# Patient Record
Sex: Female | Born: 1965 | Race: Black or African American | Hispanic: No | Smoking: Never smoker
Health system: Southern US, Community
[De-identification: ages and names within clinical notes are randomized; demographics above are authoritative.]

## PROBLEM LIST (undated history)

## (undated) ENCOUNTER — Ambulatory Visit (HOSPITAL_COMMUNITY): Admission: EM | Discharge status: Absent without leave | Payer: TRICARE For Life (TFL)

## (undated) DIAGNOSIS — F329 Major depressive disorder, single episode, unspecified: Secondary | ICD-10-CM

## (undated) DIAGNOSIS — F419 Anxiety disorder, unspecified: Secondary | ICD-10-CM

## (undated) DIAGNOSIS — F32A Depression, unspecified: Secondary | ICD-10-CM

## (undated) DIAGNOSIS — E079 Disorder of thyroid, unspecified: Secondary | ICD-10-CM

## (undated) DIAGNOSIS — I619 Nontraumatic intracerebral hemorrhage, unspecified: Secondary | ICD-10-CM

## (undated) DIAGNOSIS — A4902 Methicillin resistant Staphylococcus aureus infection, unspecified site: Secondary | ICD-10-CM

## (undated) DIAGNOSIS — R569 Unspecified convulsions: Secondary | ICD-10-CM

## (undated) DIAGNOSIS — G40919 Epilepsy, unspecified, intractable, without status epilepticus: Secondary | ICD-10-CM

## (undated) DIAGNOSIS — C801 Malignant (primary) neoplasm, unspecified: Secondary | ICD-10-CM

## (undated) DIAGNOSIS — Z89519 Acquired absence of unspecified leg below knee: Secondary | ICD-10-CM

## (undated) DIAGNOSIS — M1990 Unspecified osteoarthritis, unspecified site: Secondary | ICD-10-CM

## (undated) DIAGNOSIS — I82409 Acute embolism and thrombosis of unspecified deep veins of unspecified lower extremity: Secondary | ICD-10-CM

## (undated) HISTORY — PX: FRACTURE SURGERY: SHX138

## (undated) HISTORY — DX: Epilepsy, unspecified, intractable, without status epilepticus: G40.919

## (undated) HISTORY — PX: ABOVE KNEE LEG AMPUTATION: SUR20

## (undated) HISTORY — DX: Unspecified osteoarthritis, unspecified site: M19.90

## (undated) HISTORY — DX: Anxiety disorder, unspecified: F41.9

## (undated) HISTORY — DX: Nontraumatic intracerebral hemorrhage, unspecified: I61.9

## (undated) HISTORY — DX: Depression, unspecified: F32.A

## (undated) HISTORY — DX: Malignant (primary) neoplasm, unspecified: C80.1

## (undated) HISTORY — DX: Disorder of thyroid, unspecified: E07.9

## (undated) HISTORY — PX: TOTAL KNEE ARTHROPLASTY: SHX125

## (undated) HISTORY — DX: Unspecified convulsions: R56.9

## (undated) HISTORY — DX: Major depressive disorder, single episode, unspecified: F32.9

## (undated) HISTORY — DX: Acute embolism and thrombosis of unspecified deep veins of unspecified lower extremity: I82.409

---

## 2002-02-22 DIAGNOSIS — I619 Nontraumatic intracerebral hemorrhage, unspecified: Secondary | ICD-10-CM

## 2002-02-22 HISTORY — PX: BRAIN SURGERY: SHX531

## 2002-02-22 HISTORY — DX: Nontraumatic intracerebral hemorrhage, unspecified: I61.9

## 2003-10-08 ENCOUNTER — Ambulatory Visit (HOSPITAL_COMMUNITY): Admission: RE | Admit: 2003-10-08 | Discharge: 2003-10-08 | Payer: Self-pay | Admitting: Unknown Physician Specialty

## 2004-05-11 ENCOUNTER — Encounter: Admission: RE | Admit: 2004-05-11 | Discharge: 2004-08-09 | Payer: Self-pay | Admitting: Neurology

## 2004-09-09 ENCOUNTER — Emergency Department (HOSPITAL_COMMUNITY): Admission: EM | Admit: 2004-09-09 | Discharge: 2004-09-09 | Payer: Self-pay | Admitting: Emergency Medicine

## 2005-03-20 ENCOUNTER — Emergency Department (HOSPITAL_COMMUNITY): Admission: EM | Admit: 2005-03-20 | Discharge: 2005-03-20 | Payer: Self-pay | Admitting: Emergency Medicine

## 2006-04-16 ENCOUNTER — Emergency Department (HOSPITAL_COMMUNITY): Admission: EM | Admit: 2006-04-16 | Discharge: 2006-04-16 | Payer: Self-pay | Admitting: Emergency Medicine

## 2008-08-10 ENCOUNTER — Emergency Department (HOSPITAL_COMMUNITY): Admission: EM | Admit: 2008-08-10 | Discharge: 2008-08-10 | Payer: Self-pay | Admitting: Emergency Medicine

## 2008-09-19 ENCOUNTER — Emergency Department (HOSPITAL_COMMUNITY): Admission: EM | Admit: 2008-09-19 | Discharge: 2008-09-19 | Payer: Self-pay | Admitting: Emergency Medicine

## 2008-10-02 ENCOUNTER — Emergency Department (HOSPITAL_COMMUNITY): Admission: EM | Admit: 2008-10-02 | Discharge: 2008-10-02 | Payer: Self-pay | Admitting: Emergency Medicine

## 2008-10-15 ENCOUNTER — Emergency Department (HOSPITAL_COMMUNITY): Admission: EM | Admit: 2008-10-15 | Discharge: 2008-10-15 | Payer: Self-pay | Admitting: Emergency Medicine

## 2008-10-16 ENCOUNTER — Emergency Department (HOSPITAL_COMMUNITY): Admission: EM | Admit: 2008-10-16 | Discharge: 2008-10-16 | Payer: Self-pay | Admitting: Emergency Medicine

## 2008-10-20 ENCOUNTER — Emergency Department (HOSPITAL_COMMUNITY): Admission: EM | Admit: 2008-10-20 | Discharge: 2008-10-20 | Payer: Self-pay | Admitting: Emergency Medicine

## 2008-10-28 ENCOUNTER — Emergency Department (HOSPITAL_COMMUNITY): Admission: EM | Admit: 2008-10-28 | Discharge: 2008-10-28 | Payer: Self-pay | Admitting: Emergency Medicine

## 2008-11-02 ENCOUNTER — Emergency Department (HOSPITAL_COMMUNITY): Admission: EM | Admit: 2008-11-02 | Discharge: 2008-11-03 | Payer: Self-pay | Admitting: Emergency Medicine

## 2008-11-16 ENCOUNTER — Emergency Department (HOSPITAL_COMMUNITY): Admission: EM | Admit: 2008-11-16 | Discharge: 2008-11-16 | Payer: Self-pay | Admitting: Emergency Medicine

## 2009-01-20 ENCOUNTER — Ambulatory Visit (HOSPITAL_COMMUNITY): Admission: RE | Admit: 2009-01-20 | Discharge: 2009-01-20 | Payer: Self-pay | Admitting: Obstetrics and Gynecology

## 2009-04-25 ENCOUNTER — Emergency Department (HOSPITAL_COMMUNITY): Admission: EM | Admit: 2009-04-25 | Discharge: 2009-04-25 | Payer: Self-pay | Admitting: Emergency Medicine

## 2009-05-15 ENCOUNTER — Inpatient Hospital Stay (HOSPITAL_COMMUNITY)
Admission: RE | Admit: 2009-05-15 | Discharge: 2009-05-19 | Payer: Self-pay | Source: Home / Self Care | Admitting: Orthopedic Surgery

## 2009-07-10 ENCOUNTER — Emergency Department (HOSPITAL_COMMUNITY): Admission: EM | Admit: 2009-07-10 | Discharge: 2009-07-10 | Payer: Self-pay | Admitting: Emergency Medicine

## 2009-12-25 ENCOUNTER — Encounter: Payer: Self-pay | Admitting: Emergency Medicine

## 2009-12-25 ENCOUNTER — Inpatient Hospital Stay (HOSPITAL_COMMUNITY): Admission: AD | Admit: 2009-12-25 | Discharge: 2009-12-29 | Payer: Self-pay | Admitting: Orthopedic Surgery

## 2010-02-09 ENCOUNTER — Emergency Department (HOSPITAL_COMMUNITY)
Admission: EM | Admit: 2010-02-09 | Discharge: 2010-02-10 | Disposition: A | Discharge status: Other Acute Inpatient Hospital | Payer: Self-pay | Source: Home / Self Care | Admitting: Emergency Medicine

## 2010-02-10 ENCOUNTER — Inpatient Hospital Stay (HOSPITAL_COMMUNITY)
Admission: AD | Admit: 2010-02-10 | Discharge: 2010-02-19 | Payer: Self-pay | Source: Home / Self Care | Attending: Psychiatry | Admitting: Psychiatry

## 2010-02-11 ENCOUNTER — Inpatient Hospital Stay (HOSPITAL_COMMUNITY)
Admission: EM | Admit: 2010-02-11 | Discharge: 2010-02-13 | Disposition: A | Discharge status: Other Acute Inpatient Hospital | Payer: Self-pay | Source: Home / Self Care | Attending: Internal Medicine | Admitting: Internal Medicine

## 2010-02-12 DIAGNOSIS — F331 Major depressive disorder, recurrent, moderate: Secondary | ICD-10-CM

## 2010-02-13 ENCOUNTER — Inpatient Hospital Stay (HOSPITAL_COMMUNITY)
Admission: AD | Admit: 2010-02-13 | Discharge: 2010-02-19 | Payer: Self-pay | Source: Ambulatory Visit | Attending: Psychiatry | Admitting: Psychiatry

## 2010-03-06 ENCOUNTER — Ambulatory Visit (HOSPITAL_COMMUNITY)
Admission: RE | Admit: 2010-03-06 | Discharge: 2010-03-06 | Payer: Self-pay | Source: Home / Self Care | Attending: Licensed Clinical Social Worker | Admitting: Licensed Clinical Social Worker

## 2010-03-15 ENCOUNTER — Encounter: Payer: Self-pay | Admitting: Family Medicine

## 2010-03-20 NOTE — H&P (Signed)
NAMEROSELENE, GRAY NO.:  [id number]  MEDICAL RECORD NO.:  [id number]           PATIENT TYPE:  LOCATION:                                 FACILITY:  PHYSICIAN:  Massie Maroon, MD        DATE OF BIRTH:  1965-03-10  DATE OF ADMISSION: DATE OF DISCHARGE:                             HISTORY & PHYSICAL   CHIEF COMPLAINT:  I was lethargic.  HISTORY OF PRESENT ILLNESS:  This is a 45 year old female with a history of ? 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  She apparently had a seizure, which the nurse over at the behavioral health facility was unable to describe to me in terms of what kind of seizure she had.  This was approximately at about 1:30 in the afternoon and she was apparently treated with Ativan IV. Apparently, she was sent to the ED for observation.  However, the ED does not provide observation services, so she will be admitted, observation for now for ? seizure.  PAST MEDICAL HISTORY: 1. History of cerebral aneurysm, status post clipping in 2004. 2. History of stroke secondary to cerebral aneurysm and clipping in     2004. 3. History of depression. 4. 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 5. 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 6. History of falls leading to right distal femur fracture, status     post ORIF, November 2011.  PAST SURGICAL HISTORY: 1. Tubal ligation. 2. Right leg surgery for bone cancer.  SOCIAL HISTORY:  The patient does not smoke, drink, or do drugs.  She lives at home and is married and has 3 children.  She has been on social security disability due to poor memory since 8 years ago.  FAMILY HISTORY:  Positive for diabetes and  hypertension.  ALLERGIES: 1. MORPHINE. 2. VICODIN.  MEDICATIONS:  Please see med rec.  REVIEW OF SYSTEMS:  Negative for all 10 organ systems except for pertinent positives stated above.  PHYSICAL EXAMINATION:  VITAL SIGNS:  Temperature 98.2, pulse 86, blood pressure 98/66, pulse ox is 100% on room air. HEENT:  Anicteric, EOMI, no nystagmus, pupils 1.5 mm, symmetric. Direct, consensual, near reflexes intact.  Mucous membranes moist. NECK:  No JVD, no bruit, no thyromegaly, no adenopathy. HEART:  Regular rate and rhythm.  S1 and S2.  No murmurs, gallops, or rubs. LUNGS:  Clear to auscultation bilaterally. ABDOMEN:  Soft, nontender, nondistended.  Positive bowel sounds. EXTREMITIES:  No cyanosis, clubbing, or edema. SKIN:  No rashes. LYMPH NODES:  No adenopathy. NEURO:  Nonfocal.  LABORATORY DATA:  Urine drug screen positive for benzodiazepines. Urinalysis negative other than a few rbcs 3-6.  WBC 4.8, hemoglobin 9.6, platelet count 223, AST 20, ALT 10, alcohol level less than 5.  Sodium 140, potassium 3.4, BUN 30, creatinine 0.68.  IMAGING STUDIES:  CT brain, chronic changes, no acute intracranial findings.  So, CT brain negative for any acute process.  ASSESSMENT/PLAN: 1. Altered mental status, possibly secondary to narcotics versus     seizure too late to check prolactin level.  CK was not checked     either.  We will observe the patient.  Consider neurology consult     in a.m. for further evaluation of the seizures. 2. Diabetes, type 2. 3. Deep vein thrombosis prophylaxis.  SCDs.     Massie Maroon, MD     JYK/MEDQ  D:  02/12/2010  T:  02/12/2010  Job:  [id number]  Electronically Signed by Pearson Grippe MD on 03/20/2010 08:23:17 PM

## 2010-03-25 ENCOUNTER — Ambulatory Visit (HOSPITAL_COMMUNITY): Admit: 2010-03-25 | Payer: Self-pay | Admitting: Licensed Clinical Social Worker

## 2010-03-25 ENCOUNTER — Encounter (HOSPITAL_COMMUNITY): Payer: Self-pay | Admitting: Licensed Clinical Social Worker

## 2010-05-04 ENCOUNTER — Emergency Department (HOSPITAL_COMMUNITY)
Admission: EM | Admit: 2010-05-04 | Discharge: 2010-05-04 | Disposition: A | Discharge status: Other Acute Inpatient Hospital | Payer: Medicare Other | Attending: Emergency Medicine | Admitting: Emergency Medicine

## 2010-05-04 DIAGNOSIS — T8140XA Infection following a procedure, unspecified, initial encounter: Secondary | ICD-10-CM | POA: Insufficient documentation

## 2010-05-04 DIAGNOSIS — Z85830 Personal history of malignant neoplasm of bone: Secondary | ICD-10-CM | POA: Insufficient documentation

## 2010-05-04 DIAGNOSIS — R000 Tachycardia, unspecified: Secondary | ICD-10-CM | POA: Insufficient documentation

## 2010-05-04 DIAGNOSIS — Y839 Surgical procedure, unspecified as the cause of abnormal reaction of the patient, or of later complication, without mention of misadventure at the time of the procedure: Secondary | ICD-10-CM | POA: Insufficient documentation

## 2010-05-04 LAB — GLUCOSE, CAPILLARY
Glucose-Capillary: 100 mg/dL — ABNORMAL HIGH (ref 70–99)
Glucose-Capillary: 120 mg/dL — ABNORMAL HIGH (ref 70–99)
Glucose-Capillary: 160 mg/dL — ABNORMAL HIGH (ref 70–99)
Glucose-Capillary: 86 mg/dL (ref 70–99)
Glucose-Capillary: 95 mg/dL (ref 70–99)
Glucose-Capillary: 98 mg/dL (ref 70–99)
Glucose-Capillary: 99 mg/dL (ref 70–99)
Glucose-Capillary: 100 mg/dL — ABNORMAL HIGH (ref 70–99)
Glucose-Capillary: 120 mg/dL — ABNORMAL HIGH (ref 70–99)
Glucose-Capillary: 97 mg/dL (ref 70–99)

## 2010-05-04 LAB — PROTIME-INR
INR: 1.18 (ref 0.00–1.49)
INR: 1.25 (ref 0.00–1.49)

## 2010-05-04 LAB — BASIC METABOLIC PANEL
BUN: 3 mg/dL — ABNORMAL LOW (ref 6–23)
BUN: 8 mg/dL (ref 6–23)
CO2: 28 mEq/L (ref 19–32)
Calcium: 9.4 mg/dL (ref 8.4–10.5)
Creatinine, Ser: 0.68 mg/dL (ref 0.4–1.2)
GFR calc non Af Amer: >60 mL/min (ref >60)
GFR calc non Af Amer: >60 mL/min (ref >60)
Glucose, Bld: 88 mg/dL (ref 70–99)
Potassium: 3.4 mEq/L — ABNORMAL LOW (ref 3.5–5.1)
Potassium: 3.3 mEq/L — ABNORMAL LOW (ref 3.5–5.1)
Sodium: 139 mEq/L (ref 135–145)

## 2010-05-04 LAB — CBC
HCT: 29 % — ABNORMAL LOW (ref 36.0–46.0)
HCT: 30.7 % — ABNORMAL LOW (ref 36.0–46.0)
MCV: 92.7 fL (ref 78.0–100.0)
Platelets: 223 10*3/uL (ref 150–400)
RBC: 3.13 MIL/uL — ABNORMAL LOW (ref 3.87–5.11)
RDW: 14.1 % (ref 11.5–15.5)
RDW: 14.3 % (ref 11.5–15.5)
WBC: 4.8 10*3/uL (ref 4.0–10.5)
WBC: 4.9 10*3/uL (ref 4.0–10.5)

## 2010-05-04 LAB — HEPATIC FUNCTION PANEL
ALT: 10 U/L (ref 0–35)
AST: 20 U/L (ref 0–37)
Albumin: 3.5 g/dL (ref 3.5–5.2)
Total Protein: 6.1 g/dL (ref 6.0–8.3)

## 2010-05-04 LAB — DIFFERENTIAL
Basophils Absolute: 0 10*3/uL (ref 0.0–0.1)
Basophils Absolute: 0 10*3/uL (ref 0.0–0.1)
Basophils Relative: 0 % (ref 0–1)
Basophils Relative: 0 % (ref 0–1)
Lymphocytes Relative: 30 % (ref 12–46)
Neutro Abs: 3 10*3/uL (ref 1.7–7.7)
Neutro Abs: 3.1 10*3/uL (ref 1.7–7.7)
Neutrophils Relative %: 62 % (ref 43–77)
Neutrophils Relative %: 64 % (ref 43–77)

## 2010-05-04 LAB — URINALYSIS, ROUTINE W REFLEX MICROSCOPIC
Bilirubin Urine: NEGATIVE
Glucose, UA: NEGATIVE mg/dL
Nitrite: NEGATIVE
Nitrite: NEGATIVE
Protein, ur: NEGATIVE mg/dL
Protein, ur: NEGATIVE mg/dL
Urobilinogen, UA: 0.2 mg/dL (ref 0.0–1.0)
pH: 6 (ref 5.0–8.0)
pH: 7 (ref 5.0–8.0)

## 2010-05-04 LAB — SALICYLATE LEVEL: Salicylate Lvl: <4 mg/dL (ref 2.8–20.0)

## 2010-05-04 LAB — PREGNANCY, URINE: Preg Test, Ur: NEGATIVE

## 2010-05-04 LAB — ETHANOL: Alcohol, Ethyl (B): <5 mg/dL (ref 0–10)

## 2010-05-04 LAB — RAPID URINE DRUG SCREEN, HOSP PERFORMED
Amphetamines: NOT DETECTED
Amphetamines: NOT DETECTED
Barbiturates: NOT DETECTED
Cocaine: NOT DETECTED
Opiates: POSITIVE — AB
Tetrahydrocannabinol: NOT DETECTED

## 2010-05-04 LAB — COMPREHENSIVE METABOLIC PANEL
Alkaline Phosphatase: 70 U/L (ref 39–117)
BUN: 8 mg/dL (ref 6–23)
Chloride: 102 mEq/L (ref 96–112)
GFR calc non Af Amer: >60 mL/min (ref >60)
Glucose, Bld: 87 mg/dL (ref 70–99)
Potassium: 3.1 mEq/L — ABNORMAL LOW (ref 3.5–5.1)
Total Bilirubin: 0.7 mg/dL (ref 0.3–1.2)

## 2010-05-04 LAB — VITAMIN B12: Vitamin B-12: 602 pg/mL (ref 211–911)

## 2010-05-04 LAB — IRON: Iron: 28 ug/dL — ABNORMAL LOW (ref 42–135)

## 2010-05-04 LAB — URINE MICROSCOPIC-ADD ON

## 2010-05-05 LAB — PROTIME-INR
INR: 1.31 (ref 0.00–1.49)
INR: 1.61 — ABNORMAL HIGH (ref 0.00–1.49)
INR: 2.16 — ABNORMAL HIGH (ref 0.00–1.49)
INR: 2.17 — ABNORMAL HIGH (ref 0.00–1.49)
INR: 1.2 (ref 0.00–1.49)
Prothrombin Time: 19.3 seconds — ABNORMAL HIGH (ref 11.6–15.2)
Prothrombin Time: 24.2 seconds — ABNORMAL HIGH (ref 11.6–15.2)
Prothrombin Time: 24.3 seconds — ABNORMAL HIGH (ref 11.6–15.2)
Prothrombin Time: 15.4 seconds — ABNORMAL HIGH (ref 11.6–15.2)

## 2010-05-05 LAB — DIFFERENTIAL
Basophils Absolute: 0 10*3/uL (ref 0.0–0.1)
Basophils Relative: 0 % (ref 0–1)
Eosinophils Absolute: 0 10*3/uL (ref 0.0–0.7)
Neutro Abs: 2.1 10*3/uL (ref 1.7–7.7)
Neutrophils Relative %: 60 % (ref 43–77)

## 2010-05-05 LAB — BASIC METABOLIC PANEL
BUN: 2 mg/dL — ABNORMAL LOW (ref 6–23)
BUN: 3 mg/dL — ABNORMAL LOW (ref 6–23)
BUN: 7 mg/dL (ref 6–23)
CO2: 31 mEq/L (ref 19–32)
CO2: 32 mEq/L (ref 19–32)
Calcium: 8.4 mg/dL (ref 8.4–10.5)
Chloride: 103 mEq/L (ref 96–112)
Chloride: 108 mEq/L (ref 96–112)
Creatinine, Ser: 0.62 mg/dL (ref 0.4–1.2)
GFR calc Af Amer: >60 mL/min (ref >60)
GFR calc non Af Amer: >60 mL/min (ref >60)
GFR calc non Af Amer: >60 mL/min (ref >60)
Glucose, Bld: 106 mg/dL — ABNORMAL HIGH (ref 70–99)
Glucose, Bld: 67 mg/dL — ABNORMAL LOW (ref 70–99)
Glucose, Bld: 94 mg/dL (ref 70–99)
Potassium: 3.3 mEq/L — ABNORMAL LOW (ref 3.5–5.1)
Potassium: 3.3 mEq/L — ABNORMAL LOW (ref 3.5–5.1)
Potassium: 3.4 mEq/L — ABNORMAL LOW (ref 3.5–5.1)
Sodium: 139 mEq/L (ref 135–145)

## 2010-05-05 LAB — CBC
HCT: 23.8 % — ABNORMAL LOW (ref 36.0–46.0)
HCT: 29.4 % — ABNORMAL LOW (ref 36.0–46.0)
HCT: 30.5 % — ABNORMAL LOW (ref 36.0–46.0)
HCT: 26.5 % — ABNORMAL LOW (ref 36.0–46.0)
Hemoglobin: 10.1 g/dL — ABNORMAL LOW (ref 12.0–15.0)
Hemoglobin: 9.6 g/dL — ABNORMAL LOW (ref 12.0–15.0)
Hemoglobin: 8.6 g/dL — ABNORMAL LOW (ref 12.0–15.0)
MCH: 29 pg (ref 26.0–34.0)
MCH: 29.1 pg (ref 26.0–34.0)
MCH: 29.7 pg (ref 26.0–34.0)
MCH: 29.8 pg (ref 26.0–34.0)
MCHC: 31.5 g/dL (ref 30.0–36.0)
MCHC: 32.7 g/dL (ref 30.0–36.0)
MCHC: 33.1 g/dL (ref 30.0–36.0)
MCV: 88.8 fL (ref 78.0–100.0)
MCV: 89.7 fL (ref 78.0–100.0)
MCV: 91.3 fL (ref 78.0–100.0)
Platelets: 155 10*3/uL (ref 150–400)
Platelets: 169 10*3/uL (ref 150–400)
RBC: 3.31 MIL/uL — ABNORMAL LOW (ref 3.87–5.11)
RBC: 3.4 MIL/uL — ABNORMAL LOW (ref 3.87–5.11)
RBC: 2.9 MIL/uL — ABNORMAL LOW (ref 3.87–5.11)
RDW: 13.8 % (ref 11.5–15.5)
RDW: 14 % (ref 11.5–15.5)
RDW: 14.1 % (ref 11.5–15.5)
WBC: 5.6 10*3/uL (ref 4.0–10.5)
WBC: 6.4 10*3/uL (ref 4.0–10.5)

## 2010-05-05 LAB — TYPE AND SCREEN
ABO/RH(D): O POS
Antibody Screen: NEGATIVE
Unit division: 0

## 2010-05-05 LAB — URINALYSIS, ROUTINE W REFLEX MICROSCOPIC
Bilirubin Urine: NEGATIVE
Glucose, UA: NEGATIVE mg/dL
Glucose, UA: NEGATIVE mg/dL
Ketones, ur: NEGATIVE mg/dL
Ketones, ur: 15 mg/dL — AB
Leukocytes, UA: NEGATIVE
Nitrite: NEGATIVE
Nitrite: NEGATIVE
Protein, ur: NEGATIVE mg/dL
Protein, ur: NEGATIVE mg/dL
Specific Gravity, Urine: 1.014 (ref 1.005–1.030)
Specific Gravity, Urine: 1.028 (ref 1.005–1.030)
Urobilinogen, UA: 1 mg/dL (ref 0.0–1.0)
Urobilinogen, UA: 1 mg/dL (ref 0.0–1.0)
pH: 5.5 (ref 5.0–8.0)
pH: 5.5 (ref 5.0–8.0)

## 2010-05-05 LAB — TSH: TSH: 3.615 u[IU]/mL (ref 0.350–4.500)

## 2010-05-05 LAB — COMPREHENSIVE METABOLIC PANEL
ALT: 12 U/L (ref 0–35)
ALT: 13 U/L (ref 0–35)
AST: 25 U/L (ref 0–37)
Albumin: 2.9 g/dL — ABNORMAL LOW (ref 3.5–5.2)
Alkaline Phosphatase: 50 U/L (ref 39–117)
Alkaline Phosphatase: 52 U/L (ref 39–117)
BUN: 9 mg/dL (ref 6–23)
CO2: 32 mEq/L (ref 19–32)
CO2: 27 mEq/L (ref 19–32)
Chloride: 108 mEq/L (ref 96–112)
Creatinine, Ser: 0.65 mg/dL (ref 0.4–1.2)
GFR calc Af Amer: >60 mL/min (ref >60)
GFR calc non Af Amer: >60 mL/min (ref >60)
GFR calc non Af Amer: >60 mL/min (ref >60)
Glucose, Bld: 72 mg/dL (ref 70–99)
Potassium: 2.8 mEq/L — ABNORMAL LOW (ref 3.5–5.1)
Potassium: 3.6 mEq/L (ref 3.5–5.1)
Sodium: 144 mEq/L (ref 135–145)
Sodium: 143 mEq/L (ref 135–145)
Total Bilirubin: 0.4 mg/dL (ref 0.3–1.2)
Total Bilirubin: 0.3 mg/dL (ref 0.3–1.2)

## 2010-05-05 LAB — GRAM STAIN

## 2010-05-05 LAB — VITAMIN D 25 HYDROXY (VIT D DEFICIENCY, FRACTURES): Vit D, 25-Hydroxy: 24 ng/mL — ABNORMAL LOW (ref 30–89)

## 2010-05-05 LAB — MAGNESIUM: Magnesium: 1.3 mg/dL — ABNORMAL LOW (ref 1.5–2.5)

## 2010-05-05 LAB — AFB CULTURE WITH SMEAR (NOT AT ARMC)

## 2010-05-05 LAB — GLUCOSE, CAPILLARY
Glucose-Capillary: 106 mg/dL — ABNORMAL HIGH (ref 70–99)
Glucose-Capillary: 113 mg/dL — ABNORMAL HIGH (ref 70–99)
Glucose-Capillary: 116 mg/dL — ABNORMAL HIGH (ref 70–99)
Glucose-Capillary: 142 mg/dL — ABNORMAL HIGH (ref 70–99)
Glucose-Capillary: 95 mg/dL (ref 70–99)

## 2010-05-05 LAB — URINE CULTURE
Colony Count: NO GROWTH
Culture  Setup Time: 201111041158
Culture: NO GROWTH

## 2010-05-05 LAB — URINE MICROSCOPIC-ADD ON

## 2010-05-05 LAB — CULTURE, ROUTINE-ABSCESS

## 2010-05-05 LAB — HEMOGLOBIN AND HEMATOCRIT, BLOOD
HCT: 28.4 % — ABNORMAL LOW (ref 36.0–46.0)
Hemoglobin: 9.2 g/dL — ABNORMAL LOW (ref 12.0–15.0)

## 2010-05-05 LAB — VITAMIN D 1,25 DIHYDROXY
Vitamin D 1, 25 (OH)2 Total: 22 pg/mL (ref 18–72)
Vitamin D3 1, 25 (OH)2: 22 pg/mL

## 2010-05-05 LAB — ANAEROBIC CULTURE

## 2010-05-05 LAB — PREALBUMIN: Prealbumin: 10.7 mg/dL — ABNORMAL LOW (ref 18.0–45.0)

## 2010-05-05 LAB — HEMOGLOBIN A1C: Hgb A1c MFr Bld: 4.3 % — ABNORMAL LOW (ref <5.7)

## 2010-05-05 LAB — CALCIUM, IONIZED: Calcium, Ion: 1.17 mmol/L (ref 1.12–1.32)

## 2010-05-05 LAB — APTT: aPTT: 36 seconds (ref 24–37)

## 2010-05-05 LAB — PTH, INTACT AND CALCIUM
Calcium, Total (PTH): 8.4 mg/dL (ref 8.4–10.5)
PTH: 19.5 pg/mL (ref 14.0–72.0)

## 2010-05-05 LAB — PREPARE RBC (CROSSMATCH)

## 2010-05-11 LAB — POCT I-STAT, CHEM 8
Calcium, Ion: 1.07 mmol/L — ABNORMAL LOW (ref 1.12–1.32)
Creatinine, Ser: 0.7 mg/dL (ref 0.4–1.2)
Glucose, Bld: 89 mg/dL (ref 70–99)
Hemoglobin: 12.6 g/dL (ref 12.0–15.0)
Potassium: 3.3 mEq/L — ABNORMAL LOW (ref 3.5–5.1)
TCO2: 29 mmol/L (ref 0–100)

## 2010-05-17 LAB — COMPREHENSIVE METABOLIC PANEL
BUN: 13 mg/dL (ref 6–23)
Calcium: 9.5 mg/dL (ref 8.4–10.5)
Glucose, Bld: 75 mg/dL (ref 70–99)
Sodium: 142 mEq/L (ref 135–145)
Total Protein: 7.5 g/dL (ref 6.0–8.3)

## 2010-05-17 LAB — PROTIME-INR
INR: 1.02 (ref 0.00–1.49)
Prothrombin Time: 13.3 seconds (ref 11.6–15.2)

## 2010-05-17 LAB — BASIC METABOLIC PANEL
CO2: 29 mEq/L (ref 19–32)
Calcium: 8 mg/dL — ABNORMAL LOW (ref 8.4–10.5)
Calcium: 8.3 mg/dL — ABNORMAL LOW (ref 8.4–10.5)
Chloride: 106 mEq/L (ref 96–112)
Creatinine, Ser: 0.7 mg/dL (ref 0.4–1.2)
GFR calc Af Amer: >60 mL/min (ref >60)
GFR calc Af Amer: >60 mL/min (ref >60)
Sodium: 139 mEq/L (ref 135–145)

## 2010-05-17 LAB — CBC
HCT: 38.5 % (ref 36.0–46.0)
Hemoglobin: 12.6 g/dL (ref 12.0–15.0)
Hemoglobin: 9.1 g/dL — ABNORMAL LOW (ref 12.0–15.0)
MCHC: 32.8 g/dL (ref 30.0–36.0)
MCHC: 33 g/dL (ref 30.0–36.0)
MCV: 92 fL (ref 78.0–100.0)
RBC: 3.01 MIL/uL — ABNORMAL LOW (ref 3.87–5.11)
RBC: 3.56 MIL/uL — ABNORMAL LOW (ref 3.87–5.11)
RDW: 15.3 % (ref 11.5–15.5)
WBC: 4.4 10*3/uL (ref 4.0–10.5)

## 2010-05-17 LAB — DIFFERENTIAL
Lymphs Abs: 1.6 10*3/uL (ref 0.7–4.0)
Monocytes Relative: 8 % (ref 3–12)
Neutro Abs: 1.8 10*3/uL (ref 1.7–7.7)
Neutrophils Relative %: 48 % (ref 43–77)

## 2010-05-17 LAB — ABO/RH: ABO/RH(D): O POS

## 2010-05-17 LAB — TYPE AND SCREEN

## 2010-05-25 ENCOUNTER — Emergency Department (HOSPITAL_COMMUNITY)
Admission: EM | Admit: 2010-05-25 | Discharge: 2010-05-25 | Disposition: A | Discharge status: Home / Self Care | Payer: Medicare Other | Attending: Emergency Medicine | Admitting: Emergency Medicine

## 2010-05-25 ENCOUNTER — Emergency Department (HOSPITAL_COMMUNITY): Payer: Medicare Other

## 2010-05-25 DIAGNOSIS — Z7901 Long term (current) use of anticoagulants: Secondary | ICD-10-CM | POA: Insufficient documentation

## 2010-05-25 DIAGNOSIS — G40909 Epilepsy, unspecified, not intractable, without status epilepticus: Secondary | ICD-10-CM | POA: Insufficient documentation

## 2010-05-25 DIAGNOSIS — E119 Type 2 diabetes mellitus without complications: Secondary | ICD-10-CM | POA: Insufficient documentation

## 2010-05-25 DIAGNOSIS — D649 Anemia, unspecified: Secondary | ICD-10-CM | POA: Insufficient documentation

## 2010-05-25 DIAGNOSIS — Z85830 Personal history of malignant neoplasm of bone: Secondary | ICD-10-CM | POA: Insufficient documentation

## 2010-05-25 DIAGNOSIS — Z8614 Personal history of Methicillin resistant Staphylococcus aureus infection: Secondary | ICD-10-CM | POA: Insufficient documentation

## 2010-05-25 DIAGNOSIS — Z9889 Other specified postprocedural states: Secondary | ICD-10-CM | POA: Insufficient documentation

## 2010-05-25 LAB — DIFFERENTIAL
Basophils Absolute: 0 10*3/uL (ref 0.0–0.1)
Basophils Relative: 0 % (ref 0–1)
Eosinophils Absolute: 0.2 10*3/uL (ref 0.0–0.7)
Eosinophils Relative: 3 % (ref 0–5)
Lymphocytes Relative: 27 % (ref 12–46)
Lymphs Abs: 1.6 10*3/uL (ref 0.7–4.0)
Monocytes Absolute: 0.7 10*3/uL (ref 0.1–1.0)
Monocytes Relative: 11 % (ref 3–12)
Neutro Abs: 3.5 10*3/uL (ref 1.7–7.7)
Neutrophils Relative %: 59 % (ref 43–77)

## 2010-05-25 LAB — POCT I-STAT, CHEM 8
BUN: 7 mg/dL (ref 6–23)
Chloride: 103 mEq/L (ref 96–112)
Creatinine, Ser: 1.6 mg/dL — ABNORMAL HIGH (ref 0.4–1.2)
Potassium: 3.6 mEq/L (ref 3.5–5.1)
Sodium: 139 mEq/L (ref 135–145)

## 2010-05-25 LAB — CBC
Hemoglobin: 5.9 g/dL — CL (ref 12.0–15.0)
RBC: 2.04 MIL/uL — ABNORMAL LOW (ref 3.87–5.11)
WBC: 6 10*3/uL (ref 4.0–10.5)

## 2010-05-25 LAB — SAMPLE TO BLOOD BANK

## 2010-05-25 LAB — ABO/RH: ABO/RH(D): O POS

## 2010-05-25 LAB — PROTIME-INR
INR: 1.04 (ref 0.00–1.49)
Prothrombin Time: 13.8 seconds (ref 11.6–15.2)

## 2010-05-26 LAB — CROSSMATCH
ABO/RH(D): O POS
Antibody Screen: NEGATIVE
Unit division: 0

## 2010-05-27 LAB — COMPREHENSIVE METABOLIC PANEL
AST: 23 U/L (ref 0–37)
BUN: 8 mg/dL (ref 6–23)
CO2: 30 mEq/L (ref 19–32)
Calcium: 9.1 mg/dL (ref 8.4–10.5)
Chloride: 101 mEq/L (ref 96–112)
Creatinine, Ser: 0.78 mg/dL (ref 0.4–1.2)
GFR calc Af Amer: >60 mL/min (ref >60)
GFR calc non Af Amer: >60 mL/min (ref >60)
Total Bilirubin: 0.2 mg/dL — ABNORMAL LOW (ref 0.3–1.2)

## 2010-05-27 LAB — CBC
HCT: 34.7 % — ABNORMAL LOW (ref 36.0–46.0)
MCHC: 32.3 g/dL (ref 30.0–36.0)
MCV: 91.5 fL (ref 78.0–100.0)
Platelets: 198 10*3/uL (ref 150–400)
RBC: 3.79 MIL/uL — ABNORMAL LOW (ref 3.87–5.11)

## 2010-05-29 LAB — URINALYSIS, ROUTINE W REFLEX MICROSCOPIC
Ketones, ur: 15 mg/dL — AB
Leukocytes, UA: NEGATIVE
Nitrite: NEGATIVE
Nitrite: NEGATIVE
Specific Gravity, Urine: 1.027 (ref 1.005–1.030)
Specific Gravity, Urine: 1.025 (ref 1.005–1.030)
pH: 6 (ref 5.0–8.0)
pH: 6.5 (ref 5.0–8.0)

## 2010-05-29 LAB — URINE MICROSCOPIC-ADD ON

## 2010-05-29 LAB — DIFFERENTIAL
Basophils Relative: 0 % (ref 0–1)
Eosinophils Absolute: 0.1 10*3/uL (ref 0.0–0.7)
Eosinophils Relative: 2 % (ref 0–5)
Lymphs Abs: 2 10*3/uL (ref 0.7–4.0)
Neutrophils Relative %: 46 % (ref 43–77)

## 2010-05-29 LAB — COMPREHENSIVE METABOLIC PANEL
ALT: 13 U/L (ref 0–35)
AST: 18 U/L (ref 0–37)
Alkaline Phosphatase: 63 U/L (ref 39–117)
CO2: 24 mEq/L (ref 19–32)
Chloride: 105 mEq/L (ref 96–112)
GFR calc Af Amer: >60 mL/min (ref >60)
GFR calc non Af Amer: >60 mL/min (ref >60)
Glucose, Bld: 91 mg/dL (ref 70–99)
Potassium: 3.5 mEq/L (ref 3.5–5.1)
Sodium: 136 mEq/L (ref 135–145)
Total Bilirubin: 0.4 mg/dL (ref 0.3–1.2)

## 2010-05-29 LAB — PHENYTOIN LEVEL, TOTAL: Phenytoin Lvl: <2.5 ug/mL — ABNORMAL LOW (ref 10.0–20.0)

## 2010-05-29 LAB — CBC
Hemoglobin: 11.2 g/dL — ABNORMAL LOW (ref 12.0–15.0)
MCHC: 33.6 g/dL (ref 30.0–36.0)
RBC: 3.67 MIL/uL — ABNORMAL LOW (ref 3.87–5.11)
WBC: 4.6 10*3/uL (ref 4.0–10.5)

## 2010-05-29 LAB — POCT PREGNANCY, URINE: Preg Test, Ur: NEGATIVE

## 2010-05-29 LAB — PROTIME-INR: INR: 1 (ref 0.00–1.49)

## 2010-05-29 LAB — URINE CULTURE

## 2010-05-29 LAB — POCT I-STAT, CHEM 8
Calcium, Ion: 1.02 mmol/L — ABNORMAL LOW (ref 1.12–1.32)
Chloride: 107 mEq/L (ref 96–112)
HCT: 41 % (ref 36.0–46.0)

## 2010-05-29 LAB — PREGNANCY, URINE: Preg Test, Ur: NEGATIVE

## 2010-05-30 LAB — CBC
MCHC: 33 g/dL (ref 30.0–36.0)
MCHC: 32.5 g/dL (ref 30.0–36.0)
RBC: 3.97 MIL/uL (ref 3.87–5.11)
RDW: 12.9 % (ref 11.5–15.5)
RDW: 13.5 % (ref 11.5–15.5)

## 2010-05-30 LAB — COMPREHENSIVE METABOLIC PANEL
ALT: 12 U/L (ref 0–35)
BUN: 6 mg/dL (ref 6–23)
CO2: 30 mEq/L (ref 19–32)
Calcium: 8.5 mg/dL (ref 8.4–10.5)
Creatinine, Ser: 0.7 mg/dL (ref 0.4–1.2)
GFR calc non Af Amer: >60 mL/min (ref >60)
Glucose, Bld: 86 mg/dL (ref 70–99)
Sodium: 138 mEq/L (ref 135–145)
Total Protein: 6.4 g/dL (ref 6.0–8.3)

## 2010-05-30 LAB — URINE MICROSCOPIC-ADD ON

## 2010-05-30 LAB — POCT I-STAT, CHEM 8
BUN: 11 mg/dL (ref 6–23)
Calcium, Ion: 1.08 mmol/L — ABNORMAL LOW (ref 1.12–1.32)
Calcium, Ion: 0.96 mmol/L — ABNORMAL LOW (ref 1.12–1.32)
Chloride: 103 mEq/L (ref 96–112)
Chloride: 105 mEq/L (ref 96–112)
Creatinine, Ser: 1.1 mg/dL (ref 0.4–1.2)
Glucose, Bld: 62 mg/dL — ABNORMAL LOW (ref 70–99)
HCT: 37 % (ref 36.0–46.0)
HCT: 42 % (ref 36.0–46.0)
HCT: 37 % (ref 36.0–46.0)
Hemoglobin: 14.3 g/dL (ref 12.0–15.0)
Hemoglobin: 12.6 g/dL (ref 12.0–15.0)
Potassium: 3.6 mEq/L (ref 3.5–5.1)
Potassium: 4.1 mEq/L (ref 3.5–5.1)
Sodium: 137 mEq/L (ref 135–145)
TCO2: 25 mmol/L (ref 0–100)
TCO2: 22 mmol/L (ref 0–100)

## 2010-05-30 LAB — URINALYSIS, ROUTINE W REFLEX MICROSCOPIC
Glucose, UA: NEGATIVE mg/dL
Glucose, UA: NEGATIVE mg/dL
Ketones, ur: 15 mg/dL — AB
Ketones, ur: 15 mg/dL — AB
Ketones, ur: NEGATIVE mg/dL
Leukocytes, UA: NEGATIVE
Nitrite: NEGATIVE
Nitrite: NEGATIVE
Nitrite: NEGATIVE
Protein, ur: NEGATIVE mg/dL
Protein, ur: NEGATIVE mg/dL
Urobilinogen, UA: 0.2 mg/dL (ref 0.0–1.0)
pH: 6 (ref 5.0–8.0)
pH: 7.5 (ref 5.0–8.0)

## 2010-05-30 LAB — RAPID URINE DRUG SCREEN, HOSP PERFORMED
Amphetamines: NOT DETECTED
Benzodiazepines: POSITIVE — AB
Benzodiazepines: NOT DETECTED
Cocaine: NOT DETECTED
Cocaine: NOT DETECTED
Opiates: NOT DETECTED
Tetrahydrocannabinol: NOT DETECTED
Tetrahydrocannabinol: NOT DETECTED

## 2010-05-30 LAB — URINE CULTURE: Colony Count: >=100000

## 2010-05-30 LAB — DIFFERENTIAL
Basophils Absolute: 0 10*3/uL (ref 0.0–0.1)
Basophils Absolute: 0 10*3/uL (ref 0.0–0.1)
Basophils Relative: 1 % (ref 0–1)
Basophils Relative: 2 % — ABNORMAL HIGH (ref 0–1)
Eosinophils Absolute: 0 10*3/uL (ref 0.0–0.7)
Lymphocytes Relative: 47 % — ABNORMAL HIGH (ref 12–46)
Neutro Abs: 1.9 10*3/uL (ref 1.7–7.7)
Neutro Abs: 1.3 10*3/uL — ABNORMAL LOW (ref 1.7–7.7)
Neutrophils Relative %: 43 % (ref 43–77)
Neutrophils Relative %: 40 % — ABNORMAL LOW (ref 43–77)

## 2010-05-31 LAB — POCT I-STAT, CHEM 8
BUN: 11 mg/dL (ref 6–23)
Calcium, Ion: 1.15 mmol/L (ref 1.12–1.32)
Creatinine, Ser: 0.7 mg/dL (ref 0.4–1.2)
Glucose, Bld: 82 mg/dL (ref 70–99)
TCO2: 27 mmol/L (ref 0–100)

## 2010-06-01 LAB — HEPATIC FUNCTION PANEL
Albumin: 3.6 g/dL (ref 3.5–5.2)
Alkaline Phosphatase: 55 U/L (ref 39–117)
Total Protein: 6.3 g/dL (ref 6.0–8.3)

## 2010-06-01 LAB — URINALYSIS, ROUTINE W REFLEX MICROSCOPIC
Bilirubin Urine: NEGATIVE
Glucose, UA: NEGATIVE mg/dL
Ketones, ur: NEGATIVE mg/dL
Protein, ur: NEGATIVE mg/dL

## 2010-06-01 LAB — URINE CULTURE

## 2010-06-01 LAB — POCT I-STAT, CHEM 8
BUN: 16 mg/dL (ref 6–23)
Creatinine, Ser: 1 mg/dL (ref 0.4–1.2)
Sodium: 138 mEq/L (ref 135–145)
TCO2: 26 mmol/L (ref 0–100)

## 2010-06-01 LAB — URINE MICROSCOPIC-ADD ON

## 2010-07-10 NOTE — Procedures (Signed)
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 This is a routine 17-channel EEG with 1  channel devoted to EKG utilizing the International 10/20 lead placement  system. The patient is described as being awake and drowsy during the study.  There is asymmetry with slowing in the right hemisphere into the theta range  with frequent delta waves seen which may be compatible with a breech rhythm  of the underlying craniotomy. Rare isolated sharp waves are seen. There is  an admixture of theta and delta activity in the left hemisphere with 8 to 10  hertz alpha activity predominantly but occasionally slowing which may  reflect drowsiness. Photic stimulation was performed but did not produce  significant change in the background activity. Hyperventilation was not  performed. The EKG monitor reveals relatively regular rhythm with a rate of  72 beats per minute.   CONCLUSION:  Abnormal EEG demonstrating right hemispheric slowing with  occasional high amplitude activity which may be breech type artifact from  the underlying craniotomy. No definite seizure activity is seen during the  course of today's recording. The right hemispheric slowing is compatible  with the patient's history of right craniotomy with AVM resection. Clinical  correlation is recommended.    Tyler Deis, M.D.   ZOX:WRUE  D:  10/09/2003 13:51:19  T:  10/10/2003 45:40:98  Job #:  [id number]

## 2011-03-02 ENCOUNTER — Encounter (HOSPITAL_COMMUNITY): Payer: Self-pay | Admitting: Psychology

## 2011-03-02 ENCOUNTER — Ambulatory Visit (INDEPENDENT_AMBULATORY_CARE_PROVIDER_SITE_OTHER): Admitting: Psychology

## 2011-03-02 DIAGNOSIS — F329 Major depressive disorder, single episode, unspecified: Secondary | ICD-10-CM

## 2011-03-02 DIAGNOSIS — C419 Malignant neoplasm of bone and articular cartilage, unspecified: Secondary | ICD-10-CM | POA: Insufficient documentation
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
## 2011-03-25 ENCOUNTER — Encounter (HOSPITAL_COMMUNITY): Payer: Self-pay | Admitting: Psychology

## 2011-03-25 ENCOUNTER — Ambulatory Visit (INDEPENDENT_AMBULATORY_CARE_PROVIDER_SITE_OTHER): Payer: Medicare Other | Admitting: Psychology

## 2011-03-25 DIAGNOSIS — F332 Major depressive disorder, recurrent severe without psychotic features: Secondary | ICD-10-CM

## 2011-03-25 NOTE — Progress Notes (Signed)
THERAPIST PROGRESS NOTE  Session Time: 1140 - 1245   Participation Level: Active  Behavioral Response: CasualAlertAnxious and Depressed  Type of Therapy: Family Therapy  Treatment Goals addressed: Anxiety and Coping  Interventions: Family Systems and Reframing  Summary: Cassandra Andrade is a 46 y.o. female who presents with her mother (also primary caregiver) for second visit.  She has now done most of the assessment self report, but some sections do not reflect good understanding (BDI).  Therefore, mother remained, and we did a family session.  This elicited significant revelations and indicated areas to work on in the future. Issues:  Mother is fierce regarding helping any of her children or grandchildren with whatever they need.  However, she worries and crys about what will happen to Cassandra Andrade "if something happens to me".  Cassandra Andrade responded that she sees her mother as needing someone to care for and worries what would happen to mother if something happened to her.             When Cassandra Andrade becomes stubborn, or they have an argument, Mother is frightened that she will do something dangerous and says: " I refuse to physically fight with you.  I would put you somewhere else that you might not like so much if it has to come to that."  Mother states she has come to accept that Cassandra Andrade cannot remember recent events well and that she will have to repeat things every day. But she draws the line at having physical altercations.  Cassandra Andrade worries about her appearance and needs extensive dental work which mother cannot afford. Mother gets upset when Cassandra Andrade calls herself ugly and wants her to stop doing that.  Smetimes Cassandra Andrade feels sorry for herself and then does not push herself to do all she can physically.  Mother has found at such times it works to make Cassandra Andrade, "then she really moves".  However, at the same time, mother tries to get her to rest and not overdo the walking because she still needs surgery  for her formerly fractured leg.  In the past, Cassandra Andrade was overusing BC powders for the leg pain that can be better treated with rest.  Currently, the issue is spending too much time on the computer in the living room and not resting enough on her bed.  Cassandra Andrade's goal is to return to school on-line so that in the future she will be able to work.  She has taught herself computer skills and is investigating courses now.  The mood in the room was deep caring for each other.  They talked directly to one another and shared in ways that they admit does not happen at home.  Both had tears briefly and appropriately.  Cassandra Andrade spoke up in a normal voice tone and assertively as did her mother.   Suicidal/Homicidal: Nowithout intent/plan  Therapist Response: Facilitated the open sharing of feelings and reframed  for clarity.  Noted the discrepancy with the BDI -- patient has more symptoms of depression than were picked up there:  Sadness, guilt, anhedonia, concern about appearance, but not suicidal.   Gave some warnings about on-line schools as some are very expensive and she would not want to get into a lot of debt.  Instead, suggested she call Vocational Rehab services and see if they would agree to work with her.  Gave them the phone number and address to contact. Explained the purpose of Voc. Rehab as far as understood it and encouraged the contact.  Plan:  Return again in 2-3 weeks.  Assessment still incomplete,  Will complete at next visit.  Diagnosis: Axis I: Major Depression, Recurrent severe    Axis II: No diagnosis    Carrieann Spielberg, RN 03/25/2011

## 2011-04-15 ENCOUNTER — Ambulatory Visit (INDEPENDENT_AMBULATORY_CARE_PROVIDER_SITE_OTHER): Payer: Medicare Other | Admitting: Psychology

## 2011-04-15 DIAGNOSIS — F332 Major depressive disorder, recurrent severe without psychotic features: Secondary | ICD-10-CM

## 2011-04-15 NOTE — Progress Notes (Signed)
   THERAPIST PROGRESS NOTE  Session Time: 1130 - 1225  Participation Level: Active  Behavioral Response: Neat and Well GroomedAlertDepressed  Type of Therapy: Individual Therapy  Treatment Goals addressed: Coping  Interventions: CBT, Supportive and Reframing  Summary: Cassandra Andrade is a 46 y.o. female who presents with "having a good day".  Reports more good days happening and she is finding that she is becoming more hopeful about being able to accomplish some of her dreams for the future.  Mother was present, but the focus remained on Cassandra Andrade. She stated that her goal is to be able to "let the past go", meaning forgive herself and give up obsessing about guilt over mistakes in her past.  Mother added that she and husband were having marital trouble and Cassandra Andrade was preparing to tell him she was leaving, just before the aneuysm burst.  Because of her illness, she never did leave him and they are still married.  However, Cassandra Andrade took on responsibility for all the marital problems and still "hate myself for the things I did back then", 20 years ago.  The effect on her now is that if she starts to think on the past she has "a seizure", although a small one and that is inconvenient for her family.    Suicidal/Homicidal: Nowithout intent/plan  Therapist Response: Taught her meridian tapping related to feeling irrational guilt and we did several rounds.  Also gave her written instructions to practice at home and we will repeat next visit.  She was able to feel some of the power of the technique and responded to the affirmations positively. Also gave her the phone number for Vocational Rehabilitation, again, with the suggestion she at least call and see if they might be able to help her with plans for getting back into school or other computer training.  Plan: Return again in 2 weeks.  Diagnosis: Axis I: Major Depression, Recurrent severe    Axis II: No diagnosis    Yesenia Locurto,  RN 04/15/2011

## 2011-05-03 ENCOUNTER — Ambulatory Visit (INDEPENDENT_AMBULATORY_CARE_PROVIDER_SITE_OTHER): Payer: Medicare Other | Admitting: Psychiatry

## 2011-05-03 ENCOUNTER — Encounter (HOSPITAL_COMMUNITY): Payer: Self-pay | Admitting: Psychiatry

## 2011-05-03 DIAGNOSIS — F329 Major depressive disorder, single episode, unspecified: Secondary | ICD-10-CM

## 2011-05-03 DIAGNOSIS — F3289 Other specified depressive episodes: Secondary | ICD-10-CM

## 2011-05-03 MED ORDER — MIRTAZAPINE 7.5 MG PO TABS: 7.5000 mg | ORAL_TABLET | Freq: Every day | ORAL | Status: DC
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
## 2011-05-10 DIAGNOSIS — IMO0002 Reserved for concepts with insufficient information to code with codable children: Secondary | ICD-10-CM | POA: Insufficient documentation

## 2011-05-26 ENCOUNTER — Ambulatory Visit (HOSPITAL_COMMUNITY): Payer: Self-pay | Admitting: Psychiatry

## 2011-05-30 ENCOUNTER — Emergency Department (HOSPITAL_COMMUNITY): Payer: Medicare Other

## 2011-05-30 ENCOUNTER — Encounter (HOSPITAL_COMMUNITY): Payer: Self-pay | Admitting: Emergency Medicine

## 2011-05-30 ENCOUNTER — Emergency Department (HOSPITAL_COMMUNITY)
Admission: EM | Admit: 2011-05-30 | Discharge: 2011-05-30 | Disposition: A | Discharge status: Home / Self Care | Payer: Medicare Other | Attending: Emergency Medicine | Admitting: Emergency Medicine

## 2011-05-30 DIAGNOSIS — R51 Headache: Secondary | ICD-10-CM | POA: Insufficient documentation

## 2011-05-30 DIAGNOSIS — Z79899 Other long term (current) drug therapy: Secondary | ICD-10-CM | POA: Insufficient documentation

## 2011-05-30 DIAGNOSIS — Z8673 Personal history of transient ischemic attack (TIA), and cerebral infarction without residual deficits: Secondary | ICD-10-CM | POA: Insufficient documentation

## 2011-05-30 DIAGNOSIS — F341 Dysthymic disorder: Secondary | ICD-10-CM | POA: Insufficient documentation

## 2011-05-30 DIAGNOSIS — Z8739 Personal history of other diseases of the musculoskeletal system and connective tissue: Secondary | ICD-10-CM | POA: Insufficient documentation

## 2011-05-30 DIAGNOSIS — R569 Unspecified convulsions: Secondary | ICD-10-CM | POA: Insufficient documentation

## 2011-05-30 HISTORY — DX: Methicillin resistant Staphylococcus aureus infection, unspecified site: A49.02

## 2011-05-30 HISTORY — DX: Acquired absence of unspecified leg below knee: Z89.519

## 2011-05-30 LAB — BASIC METABOLIC PANEL
BUN: 12 mg/dL (ref 6–23)
Calcium: 9.5 mg/dL (ref 8.4–10.5)
Creatinine, Ser: 0.69 mg/dL (ref 0.50–1.10)
GFR calc Af Amer: >90 mL/min (ref >90)
GFR calc non Af Amer: >90 mL/min (ref >90)
Potassium: 3.9 mEq/L (ref 3.5–5.1)

## 2011-05-30 LAB — CBC
MCHC: 31.3 g/dL (ref 30.0–36.0)
RDW: 14.1 % (ref 11.5–15.5)

## 2011-05-30 MED ORDER — LORAZEPAM 2 MG/ML IJ SOLN
INTRAMUSCULAR | Status: AC
  Filled 2011-05-30: qty 1

## 2011-05-30 NOTE — Discharge Instructions (Signed)
It is very important that you continue to have your condition managed by your primary care physician.  Please make sure to call tomorrow to ensure appropriate ongoing care.  If you develop any new, or concerning changes in your condition, please return to the emergency department immediately.

## 2011-05-30 NOTE — ED Provider Notes (Signed)
History     CSN: [id number]  Arrival date & time 05/30/11  4098   First MD Initiated Contact with Patient 05/30/11 [phone]      Chief Complaint  Patient presents with  . Seizures    (Consider location/radiation/quality/duration/timing/severity/associated sxs/prior treatment) HPI The patient presents with her mother due to concerns of a seizure and fall.  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  Since this time the patient has had seizures.  The patient's mother notes that she has not been taking all her medication recently.  Tonight, just prior to arrival the patient had an episode of seizure that was characteristically the same as innumerable prior events.  The patient received clonazepam orally by her mother and Versed by EMS upon arrival.  She is in no seizure activity since that point, which occurred approximately 3 hours ago.  On my exam the patient is awake, notes that she has pain in her left shoulder from a fall, denies any other new focal complaints. Notably, the patient had a recent BKA secondary to bone cancer.  She denies any fevers, chills, nausea, vomiting, diarrhea.  She does have anxiety do to her prior stroke, or seizures, her ongoing illness. Past Medical History  Diagnosis Date  . Anxiety   . Depression   . Seizures   . CVA (cerebrovascular accident due to intracerebral hemorrhage) 2004  . Arthritis   . MRSA (methicillin resistant Staphylococcus aureus)   . S/P BKA (below knee amputation) unilateral     Right     Past Surgical History  Procedure Date  . Fracture surgery     Rt. femur, folllowed by MRSA  . Brain surgery 2004    cerebral aneurysm    Family History  Problem Relation Age of Onset  . Anxiety disorder Mother     History  Substance Use Topics  . Smoking status: Former Games developer  . Smokeless tobacco: Not on file  . Alcohol Use: No    OB History    Grav Para Term Preterm Abortions TAB SAB Ect Mult Living                 Review of Systems  All other systems reviewed and are negative.    Allergies  Vicodin and Morphine and related  Home Medications   Current Outpatient Rx  Name Route Sig Dispense Refill  . ACETAMINOPHEN 500 MG PO TABS Oral Take 1,000 mg by mouth every 6 (six) hours as needed. Headache or pain    . VITAMIN C 1000 MG PO TABS Oral Take 1,000 mg by mouth daily.      [name] CLONAZEPAM 1 MG PO TBDP Oral Take 1 mg by mouth 3 (three) times daily as needed. For seizure.  PCP has suggested she might take 1/2 tab. PRN for the "butterflies".     . ENOXAPARIN SODIUM 30 MG/0.3ML [place] SOLN Subcutaneous Inject 30 mg into the skin daily. Beginning Saturday 05/22/11 and continue for 14 days    . LAMOTRIGINE 200 MG PO TABS Oral Take 300 mg by mouth 2 (two) times daily.      [name] LEVOTHYROXINE SODIUM 25 MCG PO TABS Oral Take 25 mcg by mouth daily.    [name] MIRTAZAPINE 7.5 MG PO TABS Oral Take 1 tablet (7.5 mg total) by mouth at bedtime. 30 tablet 0  . OXYCODONE HCL 5 MG PO CAPS Oral Take 5-15 mg by mouth every 4 (four) hours as needed. pain    .  PROMETHAZINE HCL 25 MG PO TABS Oral Take 25 mg by mouth every 6 (six) hours as needed. nausea    . SENNOSIDES-DOCUSATE SODIUM 8.6-50 MG PO TABS Oral Take 2 tablets by mouth 2 (two) times daily.    [name] VILAZODONE HCL 40 MG PO TABS Oral Take 40 mg by mouth daily.        BP 109/73  Pulse 82  Temp(Src) 97.9 F (36.6 C) (Axillary)  Resp 13  SpO2 100%  Physical Exam  Nursing note and vitals reviewed. Constitutional: She is oriented to person, place, and time. She appears well-developed and well-nourished. She is sleeping. She is easily aroused.  Non-toxic appearance. No distress.       Patient awakens easily, but is sleeping.  HENT:       Postsurgical changes of the scalp are notable  Eyes: Conjunctivae and EOM are normal.  Cardiovascular: Normal rate and regular rhythm.   Pulmonary/Chest: Effort normal. No stridor.  Abdominal: She exhibits no distension.    Musculoskeletal:       Right BKA, stump, clean, dry, intact with postsurgical changes visible but no overt signs of infection  Neurological: She is alert, oriented to person, place, and time and easily aroused. No cranial nerve deficit.  Skin: Skin is warm and dry. She is not diaphoretic.  Psychiatric: She has a normal mood and affect.    ED Course  Procedures (including critical care time)  Labs Reviewed  BASIC METABOLIC PANEL - Abnormal; Notable for the following:    Glucose, Bld 104 (*)    All other components within normal limits  CBC - Abnormal; Notable for the following:    RBC 2.38 (*)    Hemoglobin 7.0 (*)    HCT 22.4 (*)    All other components within normal limits   Dg Chest 1 View  05/30/2011  *RADIOLOGY REPORT*  Clinical Data: Seizure, evaluate shoulders  CHEST - 1 VIEW  Comparison: 42,012  Findings: Low lung volumes with vascular crowding and right basilar atelectasis.  The heart is normal in size.  The bilateral shoulders appear unremarkable.  IMPRESSION: Low lung volumes with right basilar atelectasis.  Bilateral shoulders appear unremarkable.  Original Report Authenticated By: Charline Bills, M.D.   Ct Head Wo Contrast  05/30/2011  *RADIOLOGY REPORT*  Clinical Data: New headache with seizure, history of subarachnoid hemorrhage  CT HEAD WITHOUT CONTRAST  Technique:  Contiguous axial images were obtained from the base of the skull through the vertex without contrast.  Comparison: 02/11/2010  Findings: No evidence of parenchymal hemorrhage or extra-axial fluid collection. No mass lesion, mass effect, or midline shift.  No CT evidence of acute infarction.  Encephalomalacic changes in the right frontotemporal lobe with overlying craniotomy.  Associated ex vacuo dilatation of the right lateral ventricle.  The visualized paranasal sinuses are essentially clear. The mastoid air cells are unopacified.  Dural calcifications on the right.  No evidence of calvarial fracture.   IMPRESSION: No evidence of acute intracranial abnormality.  Encephalomalacic changes in the right frontotemporal lobe with overlying craniotomy.  Original Report Authenticated By: Charline Bills, M.D.     1. Seizure       MDM  This young female with history of seizure disorder, stroke now presents following a seizure.  On exam the patient is sleepy, but awakens easily, denies pain beyond left shoulder.  The shoulder has minimal discomfort with palpation.  The family's endorsement of inconsistent medication use is suggestive of the noncompliance.  The patient was observed  in this facility for several hours, without any new seizure activity.  The patient's labs and radiographic studies were largely reassuring.  Given these findings the patient was appropriate for discharge with continued management by her primary care physician.  Discussed all findings with the patient and her mother.  The patient was discharged in stable condition.       Gerhard Munch, MD 05/30/11 [phone]

## 2011-05-30 NOTE — ED Notes (Addendum)
Report given to Barbara RN

## 2011-05-30 NOTE — ED Notes (Signed)
Dr Jeraldine Loots made aware of seizures. Will continue to monitor.

## 2011-05-30 NOTE — ED Notes (Signed)
Patient has been discharge and waiting on ride.

## 2011-05-30 NOTE — ED Notes (Addendum)
Per EMS, pt has had 3-4 focal seizures within  The last hour. Per mother, pt had an aneurysm and CVA a couple of years ago. Typically pt has 1-2 seizures a day.  Typically they can control them with medication. However, pt has not had medication for the last couple of days.  She started having uncontrolled seizures tonight. When EMS got there, pt was unconscious and currently having focal seizures.  Pt was given 2.5 mg IV Versed. Pt has not had seizure since 0330. Pt has had medications in the past to stop seizures. Pt is 1 week post rt side BKA from [place]. Vitals: 116/60, 104, 98% on nonrebreather. Family stated that pt hit her shoulder and neck when she started having seizures. No swelling, bruising, or deformities. CBG 90.

## 2011-05-31 ENCOUNTER — Other Ambulatory Visit (HOSPITAL_COMMUNITY): Payer: Self-pay | Admitting: Psychiatry

## 2011-05-31 NOTE — Telephone Encounter (Signed)
Pt will require appointment for future feills

## 2011-06-01 ENCOUNTER — Ambulatory Visit (HOSPITAL_COMMUNITY): Payer: Self-pay | Admitting: Psychology

## 2011-06-07 ENCOUNTER — Emergency Department (HOSPITAL_COMMUNITY)
Admission: EM | Admit: 2011-06-07 | Discharge: 2011-06-07 | Disposition: A | Discharge status: Home / Self Care | Payer: Medicare Other | Attending: Emergency Medicine | Admitting: Emergency Medicine

## 2011-06-07 ENCOUNTER — Encounter (HOSPITAL_COMMUNITY): Payer: Self-pay | Admitting: *Deleted

## 2011-06-07 DIAGNOSIS — R51 Headache: Secondary | ICD-10-CM | POA: Insufficient documentation

## 2011-06-07 DIAGNOSIS — Z79899 Other long term (current) drug therapy: Secondary | ICD-10-CM | POA: Insufficient documentation

## 2011-06-07 DIAGNOSIS — Z8673 Personal history of transient ischemic attack (TIA), and cerebral infarction without residual deficits: Secondary | ICD-10-CM | POA: Insufficient documentation

## 2011-06-07 DIAGNOSIS — F341 Dysthymic disorder: Secondary | ICD-10-CM | POA: Insufficient documentation

## 2011-06-07 DIAGNOSIS — G40909 Epilepsy, unspecified, not intractable, without status epilepticus: Secondary | ICD-10-CM

## 2011-06-07 DIAGNOSIS — Z8739 Personal history of other diseases of the musculoskeletal system and connective tissue: Secondary | ICD-10-CM | POA: Insufficient documentation

## 2011-06-07 LAB — COMPREHENSIVE METABOLIC PANEL
ALT: 8 U/L (ref 0–35)
AST: 12 U/L (ref 0–37)
Alkaline Phosphatase: 99 U/L (ref 39–117)
CO2: 27 mEq/L (ref 19–32)
Calcium: 9 mg/dL (ref 8.4–10.5)
GFR calc Af Amer: >90 mL/min (ref >90)
Glucose, Bld: 97 mg/dL (ref 70–99)
Potassium: 3.4 mEq/L — ABNORMAL LOW (ref 3.5–5.1)
Sodium: 139 mEq/L (ref 135–145)
Total Protein: 7 g/dL (ref 6.0–8.3)

## 2011-06-07 LAB — URINALYSIS, ROUTINE W REFLEX MICROSCOPIC
Bilirubin Urine: NEGATIVE
Hgb urine dipstick: NEGATIVE
Ketones, ur: NEGATIVE mg/dL
Nitrite: NEGATIVE
Specific Gravity, Urine: 1.015 (ref 1.005–1.030)
pH: 6.5 (ref 5.0–8.0)

## 2011-06-07 LAB — CBC
Hemoglobin: 8.8 g/dL — ABNORMAL LOW (ref 12.0–15.0)
Platelets: ADEQUATE 10*3/uL (ref 150–400)
RBC: 3.14 MIL/uL — ABNORMAL LOW (ref 3.87–5.11)
WBC: 3.9 10*3/uL — ABNORMAL LOW (ref 4.0–10.5)

## 2011-06-07 LAB — DIFFERENTIAL
Basophils Relative: 0 % (ref 0–1)
Eosinophils Relative: 2 % (ref 0–5)
Lymphocytes Relative: 23 % (ref 12–46)
Monocytes Absolute: 0.3 10*3/uL (ref 0.1–1.0)
Neutrophils Relative %: 68 % (ref 43–77)

## 2011-06-07 MED ORDER — SODIUM CHLORIDE 0.9 % IV SOLN
500.0000 mg | Freq: Once | INTRAVENOUS | Status: AC
  Administered 2011-06-07: 500 mg via INTRAVENOUS
  Filled 2011-06-07: qty 5

## 2011-06-07 MED ORDER — ACETAMINOPHEN 325 MG PO TABS
650.0000 mg | ORAL_TABLET | Freq: Once | ORAL | Status: AC
  Administered 2011-06-07: 650 mg via ORAL
  Filled 2011-06-07: qty 2

## 2011-06-07 NOTE — ED Provider Notes (Signed)
Medical screening examination/treatment/procedure(s) were performed by non-physician practitioner and as supervising physician I was immediately available for consultation/collaboration.  Olivia Mackie, MD 06/07/11 [phone]

## 2011-06-07 NOTE — ED Notes (Signed)
Pt. Discharged to home via w/c with family, NAD noted

## 2011-06-07 NOTE — ED Provider Notes (Signed)
History     CSN: [id number]  Arrival date & time 06/07/11  0029   First MD Initiated Contact with Patient 06/07/11 0121      Chief Complaint  Patient presents with  . Tremors  . Seizures    (Consider location/radiation/quality/duration/timing/severity/associated sxs/prior treatment) HPI Comments: Patient with history of CVA and seizures presents today with mother after having had 4 seizures. Mother reports patient has daily seizures usually controlled somewhat with Klonopin and Lamictal. Mother states they have not seen a neurologist in 3 years and her primary care is managing her seizure medications at this time. Denies any recent illness, fever, chills, nausea, vomiting. Recently discharged from [facility] status post right above-the-knee amputation for MRSA infection. Denies redness or drainage from the wound. Patient has residual left-sided weakness from CVA. Mother states the seizure was different because she was holding her breath and she was concerned that she could not breathe.  Patient is a 46 y.o. female presenting with seizures. The history is provided by a parent. The history is limited by the condition of the patient. No language interpreter was used.  Seizures  This is a chronic problem. The current episode started less than 1 hour ago. The problem has not changed since onset.There were 4 to 5 seizures. The most recent episode lasted 30 to 120 seconds. Associated symptoms include sleepiness and confusion. Pertinent negatives include no headaches, no speech difficulty, no visual disturbance, no neck stiffness, no sore throat, no chest pain, no cough, no nausea, no vomiting, no diarrhea and no muscle weakness. Characteristics include eye blinking, eye deviation, bladder incontinence, rhythmic jerking and apnea. Characteristics do not include bowel incontinence, loss of consciousness, bit tongue or cyanosis. The episode was witnessed. There was no sensation of an aura present.  The seizures did not continue in the ED. The seizure(s) had no focality. Possible causes do not include med or dosage change, sleep deprivation, missed seizure meds, recent illness or change in alcohol use. There has been no fever.    Past Medical History  Diagnosis Date  . Anxiety   . Depression   . Seizures   . CVA (cerebrovascular accident due to intracerebral hemorrhage) 2004  . Arthritis   . MRSA (methicillin resistant Staphylococcus aureus)   . S/P BKA (below knee amputation) unilateral     Right     Past Surgical History  Procedure Date  . Fracture surgery     Rt. femur, folllowed by MRSA  . Brain surgery 2004    cerebral aneurysm    Family History  Problem Relation Age of Onset  . Anxiety disorder Mother     History  Substance Use Topics  . Smoking status: Former Games developer  . Smokeless tobacco: Not on file  . Alcohol Use: No    OB History    Grav Para Term Preterm Abortions TAB SAB Ect Mult Living                  Review of Systems  Unable to perform ROS: Mental status change  HENT: Negative for sore throat.   Eyes: Negative for visual disturbance.  Respiratory: Positive for apnea. Negative for cough.   Cardiovascular: Negative for chest pain and cyanosis.  Gastrointestinal: Negative for nausea, vomiting, diarrhea and bowel incontinence.  Genitourinary: Positive for bladder incontinence.  Neurological: Positive for seizures. Negative for loss of consciousness, speech difficulty and headaches.  Psychiatric/Behavioral: Positive for confusion.    Allergies  Vicodin and Morphine and related  Home Medications  Current Outpatient Rx  Name Route Sig Dispense Refill  . ACETAMINOPHEN 500 MG PO TABS Oral Take 1,000 mg by mouth every 6 (six) hours as needed. Headache or pain    . VITAMIN C 1000 MG PO TABS Oral Take 1,000 mg by mouth daily.      [name] CLONAZEPAM 1 MG PO TBDP Oral Take 1 mg by mouth 3 (three) times daily as needed. For seizure.  PCP has suggested  she might take 1/2 tab. PRN for the "butterflies".     . ENOXAPARIN SODIUM 30 MG/0.3ML [place] SOLN Subcutaneous Inject 30 mg into the skin daily. Beginning Saturday 05/22/11 and continue for 14 days    . POLYSACCHARIDE IRON COMPLEX 150 MG PO CAPS Oral Take 150 mg by mouth 2 (two) times daily.    [name] LAMOTRIGINE 200 MG PO TABS Oral Take 300 mg by mouth 2 (two) times daily.      [name] LEVOTHYROXINE SODIUM 25 MCG PO TABS Oral Take 25 mcg by mouth daily.    [name] MIRTAZAPINE 7.5 MG PO TABS  TAKE 1 TABLET BY MOUTH AT BEDTIME. 30 tablet 0  . OXYCODONE HCL 5 MG PO CAPS Oral Take 5-15 mg by mouth every 4 (four) hours as needed. pain    . PROMETHAZINE HCL 25 MG PO TABS Oral Take 25 mg by mouth every 6 (six) hours as needed. nausea    . SENNOSIDES-DOCUSATE SODIUM 8.6-50 MG PO TABS Oral Take 2 tablets by mouth 2 (two) times daily.    [name] VILAZODONE HCL 40 MG PO TABS Oral Take 40 mg by mouth daily.        BP 101/60  Pulse 75  Temp(Src) 98.3 F (36.8 C) (Oral)  Resp 15  SpO2 100%  Physical Exam  Nursing note and vitals reviewed. Constitutional: She appears well-developed and well-nourished. No distress.  HENT:  Head: Normocephalic and atraumatic.  Right Ear: External ear normal.  Left Ear: External ear normal.  Nose: Nose normal.  Mouth/Throat: Oropharynx is clear and moist. No oropharyngeal exudate.  Eyes: Conjunctivae are normal. Pupils are equal, round, and reactive to light. No scleral icterus.  Neck: Normal range of motion. Neck supple.  Cardiovascular: Normal rate, regular rhythm and normal heart sounds.  Exam reveals no gallop and no friction rub.   No murmur heard. Pulmonary/Chest: Effort normal and breath sounds normal. No respiratory distress. She has no wheezes. She has no rales. She exhibits no tenderness.  Abdominal: Soft. Bowel sounds are normal. She exhibits no distension and no mass. There is no tenderness. There is no rebound and no guarding.  Musculoskeletal:       Right AKA, sutures intact,  no drainage from wound site.  Lymphadenopathy:    She has no cervical adenopathy.  Neurological: No cranial nerve deficit. She exhibits normal muscle tone.       Left-sided hemiparesis  Skin: Skin is warm and dry. No rash noted. No erythema. No pallor.  Psychiatric: She has a normal mood and affect. Her behavior is normal. Judgment and thought content normal.    ED Course  Procedures (including critical care time)  Labs Reviewed  CBC - Abnormal; Notable for the following:    WBC 3.9 (*)    RBC 3.14 (*)    Hemoglobin 8.8 (*)    HCT 28.9 (*)    All other components within normal limits  COMPREHENSIVE METABOLIC PANEL - Abnormal; Notable for the following:    Potassium 3.4 (*)    Total Bilirubin 0.1 (*)  All other components within normal limits  DIFFERENTIAL  URINALYSIS, ROUTINE W REFLEX MICROSCOPIC   No results found. Results for orders placed during the hospital encounter of 06/07/11  CBC      Component Value Range   WBC 3.9 (*) 4.0 - 10.5 (K/uL)   RBC 3.14 (*) 3.87 - 5.11 (MIL/uL)   Hemoglobin 8.8 (*) 12.0 - 15.0 (g/dL)   HCT 16.1 (*) 09.6 - 46.0 (%)   MCV 92.0  78.0 - 100.0 (fL)   MCH 28.0  26.0 - 34.0 (pg)   MCHC 30.4  30.0 - 36.0 (g/dL)   RDW 04.5  40.9 - 81.1 (%)   Platelets    150 - 400 (K/uL)   Value: PLATELET CLUMPS NOTED ON SMEAR, COUNT APPEARS ADEQUATE  DIFFERENTIAL      Component Value Range   Neutrophils Relative 68  43 - 77 (%)   Lymphocytes Relative 23  12 - 46 (%)   Monocytes Relative 7  3 - 12 (%)   Eosinophils Relative 2  0 - 5 (%)   Basophils Relative 0  0 - 1 (%)   Neutro Abs 2.6  1.7 - 7.7 (K/uL)   Lymphs Abs 0.9  0.7 - 4.0 (K/uL)   Monocytes Absolute 0.3  0.1 - 1.0 (K/uL)   Eosinophils Absolute 0.1  0.0 - 0.7 (K/uL)   Basophils Absolute 0.0  0.0 - 0.1 (K/uL)   RBC Morphology POLYCHROMASIA PRESENT    COMPREHENSIVE METABOLIC PANEL      Component Value Range   Sodium 139  135 - 145 (mEq/L)   Potassium 3.4 (*) 3.5 - 5.1 (mEq/L)   Chloride 103   96 - 112 (mEq/L)   CO2 27  19 - 32 (mEq/L)   Glucose, Bld 97  70 - 99 (mg/dL)   BUN 8  6 - 23 (mg/dL)   Creatinine, Ser 9.14  0.50 - 1.10 (mg/dL)   Calcium 9.0  8.4 - 78.2 (mg/dL)   Total Protein 7.0  6.0 - 8.3 (g/dL)   Albumin 3.5  3.5 - 5.2 (g/dL)   AST 12  0 - 37 (U/L)   ALT 8  0 - 35 (U/L)   Alkaline Phosphatase 99  39 - 117 (U/L)   Total Bilirubin 0.1 (*) 0.3 - 1.2 (mg/dL)   GFR calc non Af Amer >90  >90 (mL/min)   GFR calc Af Amer >90  >90 (mL/min)  URINALYSIS, ROUTINE W REFLEX MICROSCOPIC      Component Value Range   Color, Urine YELLOW  YELLOW    APPearance CLEAR  CLEAR    Specific Gravity, Urine 1.015  1.005 - 1.030    pH 6.5  5.0 - 8.0    Glucose, UA NEGATIVE  NEGATIVE (mg/dL)   Hgb urine dipstick NEGATIVE  NEGATIVE    Bilirubin Urine NEGATIVE  NEGATIVE    Ketones, ur NEGATIVE  NEGATIVE (mg/dL)   Protein, ur NEGATIVE  NEGATIVE (mg/dL)   Urobilinogen, UA 0.2  0.0 - 1.0 (mg/dL)   Nitrite NEGATIVE  NEGATIVE    Leukocytes, UA NEGATIVE  NEGATIVE    Dg Chest 1 View  05/30/2011  *RADIOLOGY REPORT*  Clinical Data: Seizure, evaluate shoulders  CHEST - 1 VIEW  Comparison: 42,012  Findings: Low lung volumes with vascular crowding and right basilar atelectasis.  The heart is normal in size.  The bilateral shoulders appear unremarkable.  IMPRESSION: Low lung volumes with right basilar atelectasis.  Bilateral shoulders appear unremarkable.  Original Report Authenticated By: Lurlean Horns  Rito Ehrlich, M.D.   Ct Head Wo Contrast  05/30/2011  *RADIOLOGY REPORT*  Clinical Data: New headache with seizure, history of subarachnoid hemorrhage  CT HEAD WITHOUT CONTRAST  Technique:  Contiguous axial images were obtained from the base of the skull through the vertex without contrast.  Comparison: 02/11/2010  Findings: No evidence of parenchymal hemorrhage or extra-axial fluid collection. No mass lesion, mass effect, or midline shift.  No CT evidence of acute infarction.  Encephalomalacic changes in the right  frontotemporal lobe with overlying craniotomy.  Associated ex vacuo dilatation of the right lateral ventricle.  The visualized paranasal sinuses are essentially clear. The mastoid air cells are unopacified.  Dural calcifications on the right.  No evidence of calvarial fracture.  IMPRESSION: No evidence of acute intracranial abnormality.  Encephalomalacic changes in the right frontotemporal lobe with overlying craniotomy.  Original Report Authenticated By: Charline Bills, M.D.      Seizure    MDM  Patient with long history of seizures who is not currently being followed by neurology presents after having had 4 seizures today. She has been loaded with Keppra 500 mg IV. No seizure activity while here. Plan to recommend that the patient see a neurologist for further management of medication. Patient currently is alert and oriented and at her baseline. Denies any complaints at this time.        Izola Price [place], [place] 06/07/11 [phone]

## 2011-06-07 NOTE — ED Notes (Signed)
Pt. Received from home via EMS with c/o seizure,

## 2011-06-07 NOTE — Discharge Instructions (Signed)
Seizures You had a seizure. About 2% of the population will have a seizure problem during their lifetime. Sometimes the cause for the seizure is not known. Seizures are usually associated with one of these problems:  Epilepsy.   Not taking your seizure medicine.   Alcohol and drug abuse.   Head injury, strokes, tumors, and brain surgery.   High fever and infections.   Low blood sugar.  Evaluating a new seizure disorder may require having a brain scan or a brain wave test called an EEG. If you have been given a seizure medicine, it is very important that you take it as prescribed. Not taking these medicines as directed is the most common cause of seizures. Blood tests are often used to be sure you are taking the proper dose.  Seizures cause many different symptoms, from convulsions to brief blackouts. Do not ride a bike, drive a car, go swimming, climb in high or dangerous places such as ladders or roofs, or operate any dangerous equipment until you have your doctor's permission. If you hold a driver's license, state law may require that a report be made to the motor vehicles department. You should wear an emergency medical identification bracelet with information about your seizures. If you have any warning that a seizure may occur, lie down in a safe place to protect yourself. Teach your family and friends what to do if you have any further seizures. They should stay calm and try to keep you from falling on hard or sharp objects. It is best not to try to restrain a seizing person or to force anything into his or her mouth. Do not try to open clenched jaws. When the seizure is over, the person should be rolled on their side to help drain any vomit or secretions from the mouth. After a seizure, a person may be confused or drowsy for several minutes. An ambulance should be called if the seizure lasted more than 5 minutes or if confusion remains for more than 30 minutes. Call your caregiver or the  emergency department for further instructions. Do not drive until cleared by your caregiver or neurologist! Document Released: 03/18/2004 Document Revised: 10/21/2010 Document Reviewed: 02/08/2005 [employer] Patient Information 2012 [employer]. 

## 2011-06-08 DIAGNOSIS — Z89619 Acquired absence of unspecified leg above knee: Secondary | ICD-10-CM | POA: Insufficient documentation

## 2011-06-18 ENCOUNTER — Other Ambulatory Visit (HOSPITAL_COMMUNITY): Payer: Self-pay | Admitting: Psychiatry

## 2011-06-20 ENCOUNTER — Other Ambulatory Visit (HOSPITAL_COMMUNITY): Payer: Self-pay | Admitting: Psychiatry

## 2011-08-18 ENCOUNTER — Other Ambulatory Visit: Payer: Self-pay | Admitting: Neurology

## 2011-08-18 DIAGNOSIS — G40219 Localization-related (focal) (partial) symptomatic epilepsy and epileptic syndromes with complex partial seizures, intractable, without status epilepticus: Secondary | ICD-10-CM

## 2011-09-02 ENCOUNTER — Ambulatory Visit: Payer: Medicare Other | Attending: Orthopedic Surgery | Admitting: Physical Therapy

## 2011-09-02 DIAGNOSIS — M6281 Muscle weakness (generalized): Secondary | ICD-10-CM | POA: Insufficient documentation

## 2011-09-02 DIAGNOSIS — S78119A Complete traumatic amputation at level between unspecified hip and knee, initial encounter: Secondary | ICD-10-CM | POA: Insufficient documentation

## 2011-09-02 DIAGNOSIS — R5381 Other malaise: Secondary | ICD-10-CM | POA: Insufficient documentation

## 2011-09-02 DIAGNOSIS — IMO0001 Reserved for inherently not codable concepts without codable children: Secondary | ICD-10-CM | POA: Insufficient documentation

## 2011-09-02 DIAGNOSIS — R269 Unspecified abnormalities of gait and mobility: Secondary | ICD-10-CM | POA: Insufficient documentation

## 2011-09-07 ENCOUNTER — Encounter (HOSPITAL_COMMUNITY): Payer: Self-pay | Admitting: *Deleted

## 2011-09-07 ENCOUNTER — Emergency Department (HOSPITAL_COMMUNITY)
Admission: EM | Admit: 2011-09-07 | Discharge: 2011-09-07 | Disposition: A | Discharge status: Home / Self Care | Payer: Medicare Other | Attending: Emergency Medicine | Admitting: Emergency Medicine

## 2011-09-07 DIAGNOSIS — Z79899 Other long term (current) drug therapy: Secondary | ICD-10-CM | POA: Insufficient documentation

## 2011-09-07 DIAGNOSIS — S88119A Complete traumatic amputation at level between knee and ankle, unspecified lower leg, initial encounter: Secondary | ICD-10-CM | POA: Insufficient documentation

## 2011-09-07 DIAGNOSIS — R569 Unspecified convulsions: Secondary | ICD-10-CM | POA: Insufficient documentation

## 2011-09-07 DIAGNOSIS — Z8739 Personal history of other diseases of the musculoskeletal system and connective tissue: Secondary | ICD-10-CM | POA: Insufficient documentation

## 2011-09-07 DIAGNOSIS — F341 Dysthymic disorder: Secondary | ICD-10-CM | POA: Insufficient documentation

## 2011-09-07 DIAGNOSIS — Z8673 Personal history of transient ischemic attack (TIA), and cerebral infarction without residual deficits: Secondary | ICD-10-CM | POA: Insufficient documentation

## 2011-09-07 LAB — CBC WITH DIFFERENTIAL/PLATELET
Basophils Relative: 0 % (ref 0–1)
Eosinophils Absolute: 0 10*3/uL (ref 0.0–0.7)
Eosinophils Relative: 1 % (ref 0–5)
HCT: 39.2 % (ref 36.0–46.0)
Hemoglobin: 12.4 g/dL (ref 12.0–15.0)
Lymphs Abs: 1.3 10*3/uL (ref 0.7–4.0)
MCH: 26.6 pg (ref 26.0–34.0)
MCHC: 31.6 g/dL (ref 30.0–36.0)
MCV: 83.9 fL (ref 78.0–100.0)
Monocytes Absolute: 0.3 10*3/uL (ref 0.1–1.0)
Monocytes Relative: 9 % (ref 3–12)
RBC: 4.67 MIL/uL (ref 3.87–5.11)

## 2011-09-07 LAB — BASIC METABOLIC PANEL
BUN: 12 mg/dL (ref 6–23)
Calcium: 9.4 mg/dL (ref 8.4–10.5)
Creatinine, Ser: 0.79 mg/dL (ref 0.50–1.10)
GFR calc Af Amer: >90 mL/min (ref >90)
GFR calc non Af Amer: >90 mL/min (ref >90)
Glucose, Bld: 92 mg/dL (ref 70–99)
Potassium: 4.1 mEq/L (ref 3.5–5.1)

## 2011-09-07 NOTE — ED Provider Notes (Signed)
History     CSN: [id number]  Arrival date & time 09/07/11  0401   First MD Initiated Contact with Patient 09/07/11 [phone]      Chief Complaint  Patient presents with  . Seizures    (Consider location/radiation/quality/duration/timing/severity/associated sxs/prior treatment) The history is provided by the patient, a relative and the EMS personnel.    Past Medical History  Diagnosis Date  . Anxiety   . Depression   . Seizures   . CVA (cerebrovascular accident due to intracerebral hemorrhage) 2004  . Arthritis   . MRSA (methicillin resistant Staphylococcus aureus)   . S/P BKA (below knee amputation) unilateral     Right     Past Surgical History  Procedure Date  . Fracture surgery     Rt. femur, folllowed by MRSA  . Brain surgery 2004    cerebral aneurysm    Family History  Problem Relation Age of Onset  . Anxiety disorder Mother     History  Substance Use Topics  . Smoking status: Former Games developer  . Smokeless tobacco: Not on file  . Alcohol Use: No    OB History    Grav Para Term Preterm Abortions TAB SAB Ect Mult Living                  Review of Systems  Constitutional: Negative for fever and chills.  HENT: Negative for congestion, sore throat and neck pain.   Eyes: Positive for visual disturbance.  Respiratory: Negative for shortness of breath.   Gastrointestinal: Negative for nausea, vomiting, abdominal pain and diarrhea.  Genitourinary: Negative for dysuria.  Musculoskeletal: Negative for back pain.  Skin: Negative for rash.  Neurological: Positive for seizures. Negative for facial asymmetry and headaches.  Hematological: Does not bruise/bleed easily.    Allergies  Vicodin and Morphine and related  Home Medications   Current Outpatient Rx  Name Route Sig Dispense Refill  . ACETAMINOPHEN 500 MG PO TABS Oral Take 1,000 mg by mouth every 6 (six) hours as needed. Headache or pain    . VITAMIN C 1000 MG PO TABS Oral Take 1,000 mg by mouth daily.       [name] CLONAZEPAM 1 MG PO TBDP Oral Take 1 mg by mouth 3 (three) times daily as needed. For seizure.  PCP has suggested she might take 1/2 tab. PRN for the "butterflies".     [name] LAMOTRIGINE 200 MG PO TABS Oral Take 300 mg by mouth 2 (two) times daily.      [name] LEVETIRACETAM 500 MG PO TABS Oral Take 500 mg by mouth every 12 (twelve) hours.    [name] LEVOTHYROXINE SODIUM 25 MCG PO TABS Oral Take 25 mcg by mouth daily.    . OXYCODONE HCL 5 MG PO CAPS Oral Take 5-15 mg by mouth every 4 (four) hours as needed. pain    . PROMETHAZINE HCL 25 MG PO TABS Oral Take 25 mg by mouth every 6 (six) hours as needed. nausea    . SENNOSIDES-DOCUSATE SODIUM 8.6-50 MG PO TABS Oral Take 2 tablets by mouth 2 (two) times daily.    [name] VILAZODONE HCL 40 MG PO TABS Oral Take 40 mg by mouth daily.        BP 110/67  Temp 97.6 F (36.4 C) (Oral)  Resp 15  SpO2 100%  Physical Exam  Nursing note and vitals reviewed. Constitutional: She is oriented to person, place, and time. She appears well-developed and well-nourished. No distress.  HENT:  Head: Normocephalic and  atraumatic.  Mouth/Throat: Oropharynx is clear and moist.  Eyes: Conjunctivae and EOM are normal. Pupils are equal, round, and reactive to light.  Neck: Normal range of motion. Neck supple.  Cardiovascular: Normal rate, regular rhythm, normal heart sounds and intact distal pulses.   No murmur heard. Pulmonary/Chest: Effort normal and breath sounds normal.  Abdominal: Soft. Bowel sounds are normal. There is no tenderness.  Musculoskeletal: Normal range of motion. She exhibits no tenderness.  Neurological: She is alert and oriented to person, place, and time. No cranial nerve deficit.       Mild left upper extremity weakness. More significant left lower extremity weakness. These are both baseline since 2005.  Skin: Skin is warm. No rash noted.    ED Course  Procedures (including critical care time)  Labs Reviewed  CBC WITH DIFFERENTIAL - Abnormal; Notable for  the following:    WBC 2.9 (*)     Neutro Abs 1.3 (*)     All other components within normal limits  BASIC METABOLIC PANEL   No results found.  Date: 09/07/2011  Rate: 69  Rhythm: normal sinus rhythm  QRS Axis: normal  Intervals: normal  ST/T Wave abnormalities: normal  Conduction Disutrbances:first-degree A-V block   Narrative Interpretation:   Old EKG Reviewed: none available  Results for orders placed during the hospital encounter of 09/07/11  CBC WITH DIFFERENTIAL      Component Value Range   WBC 2.9 (*) 4.0 - 10.5 K/uL   RBC 4.67  3.87 - 5.11 MIL/uL   Hemoglobin 12.4  12.0 - 15.0 g/dL   HCT 16.1  09.6 - 04.5 %   MCV 83.9  78.0 - 100.0 fL   MCH 26.6  26.0 - 34.0 pg   MCHC 31.6  30.0 - 36.0 g/dL   RDW 40.9  81.1 - 91.4 %   Platelets 165  150 - 400 K/uL   Neutrophils Relative 45  43 - 77 %   Neutro Abs 1.3 (*) 1.7 - 7.7 K/uL   Lymphocytes Relative 44  12 - 46 %   Lymphs Abs 1.3  0.7 - 4.0 K/uL   Monocytes Relative 9  3 - 12 %   Monocytes Absolute 0.3  0.1 - 1.0 K/uL   Eosinophils Relative 1  0 - 5 %   Eosinophils Absolute 0.0  0.0 - 0.7 K/uL   Basophils Relative 0  0 - 1 %   Basophils Absolute 0.0  0.0 - 0.1 K/uL  BASIC METABOLIC PANEL      Component Value Range   Sodium 141  135 - 145 mEq/L   Potassium 4.1  3.5 - 5.1 mEq/L   Chloride 104  96 - 112 mEq/L   CO2 29  19 - 32 mEq/L   Glucose, Bld 92  70 - 99 mg/dL   BUN 12  6 - 23 mg/dL   Creatinine, Ser 7.82  0.50 - 1.10 mg/dL   Calcium 9.4  8.4 - 95.6 mg/dL   GFR calc non Af Amer >90  >90 mL/min   GFR calc Af Amer >90  >90 mL/min     1. Seizures       MDM  Patient with 2 seizures today she's been having increased frequency of seizures over the last 2 weeks followed by [facility] neurology they are aware of these. She is currently on Keppra and Klonopin. Today's seizure was in different than the others patient is now back to baseline. In 2005 she had a brain aneurysm  that during surgery resulted in a stroke  that affected her left side most of that has recovered still is peripheral vision problems still has weakness of the left lower extremity. No other systemic symptoms ongoing. We'll have family call [facility] neurology for a device on what to do with the seizure medication. They will call later today and talk to the office. Patient is stable to be discharged home.        Cassandra Jakes, MD 09/07/11 [phone]

## 2011-09-07 NOTE — ED Notes (Signed)
EDP just came out of seeing patient in room

## 2011-09-07 NOTE — ED Notes (Signed)
Pt has hx of brain aneurysm and had stroke that left her with left sided deficits during her surgery.  She has been having issues controlling seizures over last month.  Pt had 2 seizures yesterday and 2 seizures this evening.  Takes Keppra and Clonazepam for seizures.  CBg-78.  Pt is alert to voice and appropriate.  Pt has left facial droop from stroke.  Mother at bedside

## 2011-09-07 NOTE — ED Notes (Signed)
Pt placed on bedpan

## 2011-09-10 ENCOUNTER — Ambulatory Visit: Payer: Medicare Other | Admitting: Physical Therapy

## 2011-09-13 ENCOUNTER — Encounter: Payer: Self-pay | Admitting: Physical Therapy

## 2011-09-13 ENCOUNTER — Ambulatory Visit: Payer: Medicare Other | Admitting: Physical Therapy

## 2011-09-14 ENCOUNTER — Encounter: Payer: Self-pay | Admitting: Physical Therapy

## 2011-09-17 ENCOUNTER — Encounter (HOSPITAL_COMMUNITY): Payer: Self-pay | Admitting: Emergency Medicine

## 2011-09-17 ENCOUNTER — Encounter: Payer: Self-pay | Admitting: Physical Therapy

## 2011-09-17 ENCOUNTER — Emergency Department (HOSPITAL_COMMUNITY)
Admission: EM | Admit: 2011-09-17 | Discharge: 2011-09-17 | Disposition: A | Discharge status: Home / Self Care | Payer: Medicare Other | Attending: Emergency Medicine | Admitting: Emergency Medicine

## 2011-09-17 ENCOUNTER — Emergency Department (HOSPITAL_COMMUNITY): Payer: Medicare Other

## 2011-09-17 DIAGNOSIS — Z8614 Personal history of Methicillin resistant Staphylococcus aureus infection: Secondary | ICD-10-CM | POA: Insufficient documentation

## 2011-09-17 DIAGNOSIS — F3289 Other specified depressive episodes: Secondary | ICD-10-CM | POA: Insufficient documentation

## 2011-09-17 DIAGNOSIS — F329 Major depressive disorder, single episode, unspecified: Secondary | ICD-10-CM | POA: Insufficient documentation

## 2011-09-17 DIAGNOSIS — Z87891 Personal history of nicotine dependence: Secondary | ICD-10-CM | POA: Insufficient documentation

## 2011-09-17 DIAGNOSIS — R569 Unspecified convulsions: Secondary | ICD-10-CM | POA: Insufficient documentation

## 2011-09-17 DIAGNOSIS — M129 Arthropathy, unspecified: Secondary | ICD-10-CM | POA: Insufficient documentation

## 2011-09-17 DIAGNOSIS — F411 Generalized anxiety disorder: Secondary | ICD-10-CM | POA: Insufficient documentation

## 2011-09-17 DIAGNOSIS — G40909 Epilepsy, unspecified, not intractable, without status epilepticus: Secondary | ICD-10-CM

## 2011-09-17 DIAGNOSIS — Z8673 Personal history of transient ischemic attack (TIA), and cerebral infarction without residual deficits: Secondary | ICD-10-CM | POA: Insufficient documentation

## 2011-09-17 DIAGNOSIS — S88119A Complete traumatic amputation at level between knee and ankle, unspecified lower leg, initial encounter: Secondary | ICD-10-CM | POA: Insufficient documentation

## 2011-09-17 LAB — URINALYSIS, ROUTINE W REFLEX MICROSCOPIC
Bilirubin Urine: NEGATIVE
Ketones, ur: NEGATIVE mg/dL
Specific Gravity, Urine: 1.023 (ref 1.005–1.030)
Urobilinogen, UA: 0.2 mg/dL (ref 0.0–1.0)

## 2011-09-17 LAB — URINE MICROSCOPIC-ADD ON

## 2011-09-17 NOTE — ED Notes (Signed)
IV Team made aware.  

## 2011-09-17 NOTE — ED Notes (Signed)
Report given to Berkley RN. 

## 2011-09-17 NOTE — ED Provider Notes (Signed)
Medical screening examination/treatment/procedure(s) were performed by non-physician practitioner and as supervising physician I was immediately available for consultation/collaboration.  Juliet Rude. Rubin Payor, MD 09/17/11 [phone]

## 2011-09-17 NOTE — ED Notes (Signed)
45/F brought to ER by EMS for c/o focal seizure that occurred today less than an hour PTA per EMS. Pt c/o of being unable to swallow or breath through nose on scene per EMS. Pt SPO2 100%, pt AAO x 4, NAD at this time. VS WNL, CBG 104 PTA per EMS, and pt placed on cardiac and spo2 monitor. Awaiting new orders. Will continue to monitor pt.

## 2011-09-17 NOTE — ED Notes (Signed)
Attempted IV x1 . Another RN verified poor veins. IV RN paged.

## 2011-09-17 NOTE — ED Provider Notes (Signed)
History     CSN: [id number]  Arrival date & time 09/17/11  0444   First MD Initiated Contact with Patient 09/17/11 0459      Chief Complaint  Patient presents with  . Seizures    (Consider location/radiation/quality/duration/timing/severity/associated sxs/prior treatment) HPI Comments: 46 y/o female presents to ED s/p focal seizure about 1 hour ago. Mom states patient was eating a salty hamburger, had a sip of milk, and began twitching on the right side of her mouth. Mom gave klonopin to stop the seizure. States it lasted about 5 minutes. Patient gets 4-5 seizures per day. Unsure if she loses consciousness due to history of aneurysm causing to lose short term memory. She is in the process of tapering down on Keppra due to this medication causing more seizures than normal. Patient is currently experiencing SOB and feels tired. Denies any nausea, vomiting, trouble swallowing.  Patient is a 46 y.o. female presenting with seizures. The history is provided by the patient and a parent. The history is limited by the condition of the patient.  Seizures  Pertinent negatives include no speech difficulty and no chest pain.    Past Medical History  Diagnosis Date  . Anxiety   . Depression   . Seizures   . CVA (cerebrovascular accident due to intracerebral hemorrhage) 2004  . Arthritis   . MRSA (methicillin resistant Staphylococcus aureus)   . S/P BKA (below knee amputation) unilateral     Right     Past Surgical History  Procedure Date  . Fracture surgery     Rt. femur, folllowed by MRSA  . Brain surgery 2004    cerebral aneurysm    Family History  Problem Relation Age of Onset  . Anxiety disorder Mother     History  Substance Use Topics  . Smoking status: Former Games developer  . Smokeless tobacco: Not on file  . Alcohol Use: No    OB History    Grav Para Term Preterm Abortions TAB SAB Ect Mult Living                  Review of Systems  Constitutional: Positive for fatigue.    HENT: Negative for trouble swallowing.   Respiratory: Positive for shortness of breath.   Cardiovascular: Negative for chest pain.  Neurological: Positive for seizures. Negative for speech difficulty.    Allergies  Vicodin and Morphine and related  Home Medications   Current Outpatient Rx  Name Route Sig Dispense Refill  . VITAMIN C 1000 MG PO TABS Oral Take 1,000 mg by mouth daily.      [name] CLONAZEPAM 1 MG PO TBDP Oral Take 1 mg by mouth 3 (three) times daily as needed. For seizure.  PCP has suggested she might take 1/2 tab. PRN for the "butterflies".     [name] LAMOTRIGINE 200 MG PO TABS Oral Take 300 mg by mouth 2 (two) times daily.      [name] LEVETIRACETAM 500 MG PO TABS Oral Take 500 mg by mouth every 12 (twelve) hours.    [name] LEVOTHYROXINE SODIUM 25 MCG PO TABS Oral Take 25 mcg by mouth daily.    . OXYCODONE HCL 5 MG PO CAPS Oral Take 5-15 mg by mouth every 4 (four) hours as needed. pain    . PROMETHAZINE HCL 25 MG PO TABS Oral Take 25 mg by mouth every 6 (six) hours as needed. nausea    . SENNOSIDES-DOCUSATE SODIUM 8.6-50 MG PO TABS Oral Take 2 tablets by mouth 2 (  two) times daily.    [name] VILAZODONE HCL 40 MG PO TABS Oral Take 40 mg by mouth daily.        BP 106/63  Pulse 76  Temp 97.5 F (36.4 C) (Oral)  Resp 20  SpO2 100%  Physical Exam  Nursing note and vitals reviewed. Constitutional: She is oriented to person, place, and time. She appears lethargic. She appears distressed.  HENT:  Head: Normocephalic and atraumatic.  Mouth/Throat: Oropharynx is clear and moist.  Eyes: Conjunctivae and EOM are normal. Pupils are equal, round, and reactive to light.  Neck: Neck supple.  Cardiovascular: Normal rate, regular rhythm and normal heart sounds.   Pulmonary/Chest: Breath sounds normal. No respiratory distress.       Labored breathing.  Abdominal: Soft. Normal appearance and bowel sounds are normal. There is no tenderness.  Neurological: She is oriented to person, place, and time. She  appears lethargic. She displays no atrophy and no tremor. She exhibits normal muscle tone. She displays no seizure activity.       Neuro exam not accurate due to patient being lethargic. Patient able to raise arms and legs with equal strength bilaterally.  Skin: Skin is warm. No rash noted.  Psychiatric: Her speech is delayed. She is slowed.    ED Course  Procedures (including critical care time)   Labs Reviewed  URINALYSIS, ROUTINE W REFLEX MICROSCOPIC   No results found.   No diagnosis found.    MDM  46 y/o female with history of seizures currently SOB. Awaiting CXR and urine. Will monitor.   6:07 AM Case discussed with Georgie Chard, PA-C and care transferred at this time.       Trevor Mace, PA-C 09/17/11 [phone]

## 2011-09-17 NOTE — ED Provider Notes (Signed)
Pt's imaging and UA are negative. Findings d/w pt and mom. Pt is no longer postictal - awake and alert at this time, states "I want to go home." Not lethargic or toxic appearing; no difficulty breathing. Family instructed to contact [facility] neuro today for f/u. Reasons to return discussed.  Grant Fontana, PA-C 09/17/11 [phone]

## 2011-09-20 NOTE — ED Provider Notes (Signed)
Medical screening examination/treatment/procedure(s) were conducted as a shared visit with non-physician practitioner(s) and myself.  I personally evaluated the patient during the encounter  Cassandra Andrade. Rubin Payor, MD 09/20/11 [phone]

## 2011-09-21 ENCOUNTER — Ambulatory Visit: Payer: Medicare Other | Admitting: Physical Therapy

## 2011-09-23 ENCOUNTER — Ambulatory Visit: Payer: Medicare Other | Attending: Orthopedic Surgery | Admitting: Physical Therapy

## 2011-09-23 DIAGNOSIS — R5381 Other malaise: Secondary | ICD-10-CM | POA: Insufficient documentation

## 2011-09-23 DIAGNOSIS — M6281 Muscle weakness (generalized): Secondary | ICD-10-CM | POA: Insufficient documentation

## 2011-09-23 DIAGNOSIS — S78119A Complete traumatic amputation at level between unspecified hip and knee, initial encounter: Secondary | ICD-10-CM | POA: Insufficient documentation

## 2011-09-23 DIAGNOSIS — IMO0001 Reserved for inherently not codable concepts without codable children: Secondary | ICD-10-CM | POA: Insufficient documentation

## 2011-09-23 DIAGNOSIS — R269 Unspecified abnormalities of gait and mobility: Secondary | ICD-10-CM | POA: Insufficient documentation

## 2011-09-28 ENCOUNTER — Ambulatory Visit: Payer: Medicare Other | Admitting: Physical Therapy

## 2011-09-28 ENCOUNTER — Encounter: Payer: Self-pay | Admitting: Physical Therapy

## 2011-09-30 ENCOUNTER — Ambulatory Visit: Payer: Medicare Other | Admitting: Physical Therapy

## 2011-09-30 ENCOUNTER — Encounter: Payer: Self-pay | Admitting: *Deleted

## 2011-10-05 ENCOUNTER — Ambulatory Visit: Payer: Medicare Other | Admitting: *Deleted

## 2011-10-05 ENCOUNTER — Encounter: Payer: Self-pay | Admitting: Physical Therapy

## 2011-10-08 ENCOUNTER — Ambulatory Visit: Payer: Medicare Other | Admitting: Physical Therapy

## 2011-10-12 ENCOUNTER — Ambulatory Visit: Payer: Medicare Other | Admitting: Physical Therapy

## 2011-10-13 ENCOUNTER — Ambulatory Visit: Payer: Medicare Other | Admitting: Physical Therapy

## 2011-10-18 ENCOUNTER — Ambulatory Visit: Payer: Medicare Other | Admitting: Physical Therapy

## 2011-10-20 ENCOUNTER — Ambulatory Visit: Payer: Medicare Other | Admitting: Physical Therapy

## 2011-10-26 ENCOUNTER — Ambulatory Visit: Payer: Medicare Other | Attending: Orthopedic Surgery | Admitting: Physical Therapy

## 2011-10-26 DIAGNOSIS — S78119A Complete traumatic amputation at level between unspecified hip and knee, initial encounter: Secondary | ICD-10-CM | POA: Insufficient documentation

## 2011-10-26 DIAGNOSIS — R269 Unspecified abnormalities of gait and mobility: Secondary | ICD-10-CM | POA: Insufficient documentation

## 2011-10-26 DIAGNOSIS — R5381 Other malaise: Secondary | ICD-10-CM | POA: Insufficient documentation

## 2011-10-26 DIAGNOSIS — M6281 Muscle weakness (generalized): Secondary | ICD-10-CM | POA: Insufficient documentation

## 2011-10-26 DIAGNOSIS — IMO0001 Reserved for inherently not codable concepts without codable children: Secondary | ICD-10-CM | POA: Insufficient documentation

## 2011-10-28 ENCOUNTER — Ambulatory Visit: Payer: Self-pay | Admitting: Physical Therapy

## 2011-10-28 ENCOUNTER — Ambulatory Visit: Payer: Medicare Other | Admitting: Physical Therapy

## 2011-11-01 ENCOUNTER — Ambulatory Visit: Payer: Medicare Other | Admitting: Physical Therapy

## 2011-11-03 ENCOUNTER — Ambulatory Visit: Payer: Medicare Other | Admitting: Physical Therapy

## 2011-11-08 ENCOUNTER — Ambulatory Visit: Payer: Medicare Other | Admitting: Physical Therapy

## 2011-11-10 ENCOUNTER — Ambulatory Visit: Payer: Medicare Other | Admitting: *Deleted

## 2011-11-15 ENCOUNTER — Ambulatory Visit: Payer: Medicare Other | Admitting: Physical Therapy

## 2011-11-17 ENCOUNTER — Ambulatory Visit: Payer: Medicare Other | Admitting: Physical Therapy

## 2011-11-23 ENCOUNTER — Ambulatory Visit: Payer: Medicare Other | Attending: Orthopedic Surgery | Admitting: Physical Therapy

## 2011-11-23 DIAGNOSIS — S78119A Complete traumatic amputation at level between unspecified hip and knee, initial encounter: Secondary | ICD-10-CM | POA: Insufficient documentation

## 2011-11-23 DIAGNOSIS — R269 Unspecified abnormalities of gait and mobility: Secondary | ICD-10-CM | POA: Insufficient documentation

## 2011-11-23 DIAGNOSIS — R5381 Other malaise: Secondary | ICD-10-CM | POA: Insufficient documentation

## 2011-11-23 DIAGNOSIS — M6281 Muscle weakness (generalized): Secondary | ICD-10-CM | POA: Insufficient documentation

## 2011-11-23 DIAGNOSIS — IMO0001 Reserved for inherently not codable concepts without codable children: Secondary | ICD-10-CM | POA: Insufficient documentation

## 2011-11-25 ENCOUNTER — Ambulatory Visit: Payer: Medicare Other | Admitting: Physical Therapy

## 2011-11-29 ENCOUNTER — Encounter: Payer: Self-pay | Admitting: Physical Therapy

## 2011-12-01 ENCOUNTER — Ambulatory Visit: Payer: Medicare Other | Admitting: Physical Therapy

## 2011-12-07 ENCOUNTER — Ambulatory Visit: Payer: Medicare Other | Admitting: *Deleted

## 2011-12-09 ENCOUNTER — Ambulatory Visit: Payer: Medicare Other | Admitting: *Deleted

## 2011-12-13 ENCOUNTER — Ambulatory Visit: Payer: Medicare Other | Admitting: Physical Therapy

## 2011-12-15 ENCOUNTER — Ambulatory Visit: Payer: Medicare Other | Admitting: Physical Therapy

## 2011-12-20 ENCOUNTER — Ambulatory Visit: Payer: Medicare Other | Admitting: *Deleted

## 2011-12-22 ENCOUNTER — Ambulatory Visit: Payer: Medicare Other | Admitting: Physical Therapy

## 2011-12-27 ENCOUNTER — Ambulatory Visit: Payer: Medicare Other | Attending: Orthopedic Surgery | Admitting: *Deleted

## 2011-12-27 DIAGNOSIS — M6281 Muscle weakness (generalized): Secondary | ICD-10-CM | POA: Insufficient documentation

## 2011-12-27 DIAGNOSIS — R5381 Other malaise: Secondary | ICD-10-CM | POA: Insufficient documentation

## 2011-12-27 DIAGNOSIS — S78119A Complete traumatic amputation at level between unspecified hip and knee, initial encounter: Secondary | ICD-10-CM | POA: Insufficient documentation

## 2011-12-27 DIAGNOSIS — R269 Unspecified abnormalities of gait and mobility: Secondary | ICD-10-CM | POA: Insufficient documentation

## 2011-12-27 DIAGNOSIS — IMO0001 Reserved for inherently not codable concepts without codable children: Secondary | ICD-10-CM | POA: Insufficient documentation

## 2011-12-29 ENCOUNTER — Encounter: Payer: Self-pay | Admitting: Physical Therapy

## 2011-12-30 ENCOUNTER — Ambulatory Visit: Payer: Medicare Other | Admitting: Physical Therapy

## 2012-05-25 ENCOUNTER — Ambulatory Visit (HOSPITAL_COMMUNITY): Payer: Self-pay | Admitting: Psychiatry

## 2012-05-30 ENCOUNTER — Other Ambulatory Visit: Payer: Self-pay | Admitting: Neurology

## 2012-06-05 DIAGNOSIS — I6990 Unspecified sequelae of unspecified cerebrovascular disease: Secondary | ICD-10-CM | POA: Insufficient documentation

## 2012-06-12 ENCOUNTER — Encounter (HOSPITAL_COMMUNITY): Payer: Self-pay

## 2012-06-28 ENCOUNTER — Other Ambulatory Visit: Payer: Self-pay | Admitting: Radiology

## 2012-07-03 ENCOUNTER — Ambulatory Visit (HOSPITAL_COMMUNITY): Payer: Self-pay | Admitting: Psychiatry

## 2012-07-04 ENCOUNTER — Ambulatory Visit (INDEPENDENT_AMBULATORY_CARE_PROVIDER_SITE_OTHER): Payer: Medicare Other | Admitting: Psychiatry

## 2012-07-04 ENCOUNTER — Encounter (HOSPITAL_COMMUNITY): Payer: Self-pay | Admitting: Psychiatry

## 2012-07-04 VITALS — BP 98/64 | HR 90

## 2012-07-04 DIAGNOSIS — G894 Chronic pain syndrome: Secondary | ICD-10-CM | POA: Insufficient documentation

## 2012-07-04 DIAGNOSIS — E039 Hypothyroidism, unspecified: Secondary | ICD-10-CM

## 2012-07-04 DIAGNOSIS — F329 Major depressive disorder, single episode, unspecified: Secondary | ICD-10-CM

## 2012-07-04 DIAGNOSIS — Z89511 Acquired absence of right leg below knee: Secondary | ICD-10-CM

## 2012-07-04 DIAGNOSIS — Z89519 Acquired absence of unspecified leg below knee: Secondary | ICD-10-CM | POA: Insufficient documentation

## 2012-07-04 DIAGNOSIS — G40909 Epilepsy, unspecified, not intractable, without status epilepticus: Secondary | ICD-10-CM | POA: Insufficient documentation

## 2012-07-04 DIAGNOSIS — F339 Major depressive disorder, recurrent, unspecified: Secondary | ICD-10-CM

## 2012-07-04 MED ORDER — FLUOXETINE HCL 10 MG PO CAPS: ORAL_CAPSULE | ORAL | Status: DC
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
## 2012-07-13 ENCOUNTER — Ambulatory Visit (HOSPITAL_COMMUNITY): Payer: Self-pay | Admitting: Psychiatry

## 2012-07-26 ENCOUNTER — Encounter (HOSPITAL_COMMUNITY): Payer: Self-pay | Admitting: Psychiatry

## 2012-07-26 ENCOUNTER — Ambulatory Visit (INDEPENDENT_AMBULATORY_CARE_PROVIDER_SITE_OTHER): Payer: Medicare Other | Admitting: Psychiatry

## 2012-07-26 VITALS — BP 103/62 | HR 82

## 2012-07-26 DIAGNOSIS — F329 Major depressive disorder, single episode, unspecified: Secondary | ICD-10-CM

## 2012-07-26 DIAGNOSIS — F339 Major depressive disorder, recurrent, unspecified: Secondary | ICD-10-CM

## 2012-07-26 MED ORDER — FLUOXETINE HCL 10 MG PO CAPS: ORAL_CAPSULE | ORAL | Status: DC
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
## 2012-07-31 ENCOUNTER — Ambulatory Visit (INDEPENDENT_AMBULATORY_CARE_PROVIDER_SITE_OTHER): Payer: Federal, State, Local not specified - Other | Admitting: Licensed Clinical Social Worker

## 2012-07-31 ENCOUNTER — Encounter (HOSPITAL_COMMUNITY): Payer: Self-pay | Admitting: Licensed Clinical Social Worker

## 2012-07-31 DIAGNOSIS — F329 Major depressive disorder, single episode, unspecified: Secondary | ICD-10-CM
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
## 2012-08-23 ENCOUNTER — Ambulatory Visit (INDEPENDENT_AMBULATORY_CARE_PROVIDER_SITE_OTHER): Payer: Medicare Other | Admitting: Psychiatry

## 2012-08-23 ENCOUNTER — Encounter (HOSPITAL_COMMUNITY): Payer: Self-pay | Admitting: Psychiatry

## 2012-08-23 VITALS — BP 111/68 | HR 80

## 2012-08-23 DIAGNOSIS — F329 Major depressive disorder, single episode, unspecified: Secondary | ICD-10-CM

## 2012-08-23 DIAGNOSIS — F339 Major depressive disorder, recurrent, unspecified: Secondary | ICD-10-CM

## 2012-08-23 MED ORDER — FLUOXETINE HCL 10 MG PO CAPS: ORAL_CAPSULE | ORAL | Status: DC
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
## 2012-08-30 ENCOUNTER — Ambulatory Visit (HOSPITAL_COMMUNITY): Payer: Medicare Other | Admitting: Licensed Clinical Social Worker

## 2012-09-18 ENCOUNTER — Ambulatory Visit (INDEPENDENT_AMBULATORY_CARE_PROVIDER_SITE_OTHER): Payer: Medicare Other | Admitting: Licensed Clinical Social Worker

## 2012-09-18 DIAGNOSIS — F329 Major depressive disorder, single episode, unspecified: Secondary | ICD-10-CM

## 2012-09-18 DIAGNOSIS — F32A Depression, unspecified: Secondary | ICD-10-CM

## 2012-09-18 NOTE — Progress Notes (Signed)
   THERAPIST PROGRESS NOTE  Session Time: 9:30am-10:20am  Participation Level: Active  Behavioral Response: Fairly GroomedDrowsyDepressed  Type of Therapy: Individual Therapy  Treatment Goals addressed: Coping  Interventions: CBT, Motivational Interviewing, Strength-based, Supportive and Reframing  Summary: Cassandra Andrade is a 47 y.o. female who presents with depressed mood and flat affect. She reports some improvement in her mood stability and is pleased that today is a good day. She expresses a desire not to care what others think about her, but that she finds this difficult. She processes her frustration that her mother becomes easily frustrated with her. She wants to start a business in [place] and [address] "great again". 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 She is able to recognize, with feedback, that her goals are big and that her thinking is disorganized when she talks about her goals. She is easily distracted and needs redirection. Her sleep remains problematic. She can fall asleep, but is unable to stay asleep. Her appetite is wnl.   Suicidal/Homicidal: Nowithout intent/plan  Therapist Response: Assessed patients current functioning and reviewed progress. Reviewed coping strategies. Assessed patients safety and assisted in identifying protective factors.  Reviewed crisis plan with patient. Assisted patient with the expression of frustration. Reviewed patients self care plan. Assessed progress related to self care. Patients self care is fair. Recommend daily exercise, increased socialization and recreation. Used CBT to assist patient with the identification of negative distortions and irrational thoughts. Encouraged patient to verbalize alternative and factual responses which challenge thought distortions. Used motivational interviewing to assist and encourage patient through the change process. Explored patients barriers to change. Completed treatment  plan.   Plan: Return again in three weeks.  Diagnosis: Axis I: Major Depression, Recurrent severe    Axis II: No diagnosis    Amaya Blakeman, LCSW 09/18/2012

## 2012-10-19 ENCOUNTER — Ambulatory Visit (INDEPENDENT_AMBULATORY_CARE_PROVIDER_SITE_OTHER): Payer: Medicare Other | Admitting: Licensed Clinical Social Worker

## 2012-10-19 DIAGNOSIS — F329 Major depressive disorder, single episode, unspecified: Secondary | ICD-10-CM

## 2012-10-19 DIAGNOSIS — F32A Depression, unspecified: Secondary | ICD-10-CM

## 2012-10-19 NOTE — Progress Notes (Signed)
   THERAPIST PROGRESS NOTE  Session Time: 9:30am-10:20am  Participation Level: Active  Behavioral Response: Well GroomedDrowsy and LethargicDepressed  Type of Therapy: Individual Therapy  Treatment Goals addressed: Coping  Interventions: CBT, Motivational Interviewing, Strength-based, Supportive and Reframing  Summary: Cassandra Andrade is a 47 y.o. female who presents with depressed mood and flat affect. Patient is lethargic today and reports poor and inconsistent sleep. She is frustrated with herself for a consistent lack of motivation and follow through. She talks often about starting her own business and researching this on the internet. She talks about returning to school but has not followed through on this. She endorses problems with her memory and when asked if she is practicing walking with her prostectic, she reports that she thinks she does more than her mother says she does. She endorses difficulty focusing and comprehending what she has read. There is a disconnect between patients current capacity and her belief of her capacity.   Suicidal/Homicidal: Nowithout intent/plan  Therapist Response: Assessed patients current functioning and reviewed progress. Reviewed coping strategies. Assessed patients safety and assisted in identifying protective factors.  Reviewed crisis plan with patient. Assisted patient with the expression of frustration. Reviewed patients self care plan. Assessed progress related to self care. Patients self care is fair. Recommend daily exercise, increased socialization and recreation. Used CBT to assist patient with the identification of negative distortions and irrational thoughts. Encouraged patient to verbalize alternative and factual responses which challenge thought distortions. Used motivational interviewing to assist and encourage patient through the change process. Explored patients barriers to change.   Plan: Return again in three to four  weeks.  Diagnosis: Axis I: Depressive Disorder NOS    Axis II: No diagnosis    Kemari Mares, LCSW 10/19/2012

## 2012-10-24 ENCOUNTER — Encounter: Payer: Self-pay | Admitting: Neurology

## 2012-10-24 ENCOUNTER — Ambulatory Visit (INDEPENDENT_AMBULATORY_CARE_PROVIDER_SITE_OTHER): Payer: Medicare Other | Admitting: Neurology

## 2012-10-24 VITALS — BP 90/63 | HR 80

## 2012-10-24 DIAGNOSIS — G40909 Epilepsy, unspecified, not intractable, without status epilepticus: Secondary | ICD-10-CM

## 2012-10-24 DIAGNOSIS — Z5181 Encounter for therapeutic drug level monitoring: Secondary | ICD-10-CM

## 2012-10-24 NOTE — Progress Notes (Signed)
Reason for visit: Seizures  Cassandra Andrade is an 47 y.o. 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 The patient has developed intractable seizures since that time, currently on carbamazepine and Lamictal and clonazepam. The patient is felt to have daily seizure events, but the patient apparently has episodes associated with "negative thoughts". The patient will come get her mother to take medications during these events. The patient has not had any actual events of loss of consciousness or generalized jerking. The patient may have episodes of "staring off". The patient has significant psychiatric issues, and she is followed through psychiatry. The patient was set up for a video EEG monitoring study last year, but the mother was unable to accompany the patient for an extended period of time. The mother currently is willing to have this study at this time. The patient has not had any blood work since last seen.  Past Medical History  Diagnosis Date  . Anxiety   . Depression   . Seizures   . CVA (cerebrovascular accident due to intracerebral hemorrhage) 2004  . Arthritis   . MRSA (methicillin resistant Staphylococcus aureus)   . S/P BKA (below knee amputation) unilateral     Right   . Thyroid disease   . Cancer     Osteosarcoma, right leg    Past Surgical History  Procedure Laterality Date  . Fracture surgery      Rt. femur, folllowed by MRSA  . Brain surgery  2004    cerebral aneurysm  . Total knee arthroplasty Right   . Above knee leg amputation Right     Osteosarcoma    Family History  Problem Relation Age of Onset  . Anxiety disorder Mother   . Diabetes Father     Social history:  reports that she has quit smoking. She has never used smokeless tobacco. She reports that she does not drink alcohol or use illicit drugs.    Allergies  Allergen  Reactions  . Vicodin [Hydrocodone-Acetaminophen] Nausea And Vomiting  . Morphine And Related Rash    Allergic reaction only in iv form    Medications:  Current Outpatient Prescriptions on File Prior to Visit  Medication Sig Dispense Refill  . Ascorbic Acid (VITAMIN C) 1000 MG tablet Take 1,000 mg by mouth daily.        . carbamazepine (TEGRETOL) 200 MG tablet TAKE 1 AND 1/2 TABLETS TWICE DAILY  90 tablet  5  . clonazePAM (KLONOPIN) 1 MG disintegrating tablet Take 1 mg by mouth 3 (three) times daily as needed. For seizure.  PCP has suggested she might take 1/2 tab. PRN for the "butterflies".       [name] FLUoxetine (PROZAC) 10 MG capsule Take 3 capsule daily  90 capsule  1  . lamoTRIgine (LAMICTAL) 200 MG tablet Take 300 mg by mouth 2 (two) times daily.        [name] levothyroxine (SYNTHROID, LEVOTHROID) 25 MCG tablet Take 25 mcg by mouth daily.      . promethazine (PHENERGAN) 25 MG tablet Take 25 mg by mouth every 6 (six) hours as needed. nausea       No current facility-administered medications on file prior to visit.    ROS:  Out of a complete 14 system review of symptoms, the patient complains only of the following symptoms, and all other reviewed systems are negative.  Weight gain Dizziness Skin rash, itching Blood in the stool Joint pain, achy muscles Memory loss, confusion, headache, slurred speech, seizures Depression, anxiety, hypersomnolence  Blood pressure 90/63, pulse 80, weight 0 lb (0 kg).  Physical Exam  General: The patient is alert and cooperative at the time of the examination.  Skin: The patient has a right above-knee amputation, with 2-3+ edema below the knee on the left.   Neurologic Exam  Cranial nerves: Facial symmetry is present. Speech is normal, no aphasia or dysarthria is noted. Extraocular movements are full. Visual fields are notable for a dense left homonymous visual field deficit.  Motor: The patient has good strength in all 4  extremities.  Coordination: The patient has good finger-nose-finger bilaterally.  Gait and station: The patient is wheelchair-bound, the patient was not ambulated. No drift is seen.  Reflexes: Deep tendon reflexes are symmetric, but are depressed.   Assessment/Plan:  1. Intractable seizures  2. Cerebral aneurysm, right brain ischemia  3. Depression and anxiety  It is quite probable that many of the events that the family is calling "seizures" is likely behavior, not actual seizures. The patient will need to be evaluated for her epilepsy syndrome with a video EEG monitoring study. If the patient is having frequent seizure events, we will need to determine whether she may be a surgical candidate or not. The patient will be set up again for a referral to the epilepsy monitoring unit at [facility]. The patient will followup through this office in 6-8 months. The patient will continue her medications for now.  Cassandra Palau MD 10/24/2012 12:47 PM  [facility] [address] [place], [place] [address]  Phone [phone] Fax [phone]

## 2012-10-26 LAB — CBC WITH DIFFERENTIAL
Basos: 0 % (ref 0–3)
Eos: 2 % (ref 0–5)
HCT: 34.1 % (ref 34.0–46.6)
Lymphocytes Absolute: 1.3 10*3/uL (ref 0.7–3.1)
MCV: 88 fL (ref 79–97)
Monocytes: 8 % (ref 4–12)
Neutrophils Absolute: 1.7 10*3/uL (ref 1.4–7.0)
RBC: 3.86 x10E6/uL (ref 3.77–5.28)
WBC: 3.3 10*3/uL — ABNORMAL LOW (ref 3.4–10.8)

## 2012-10-26 LAB — COMPREHENSIVE METABOLIC PANEL
Albumin: 4.4 g/dL (ref 3.5–5.5)
BUN: 11 mg/dL (ref 6–24)
CO2: 27 mmol/L (ref 18–29)
Chloride: 96 mmol/L — ABNORMAL LOW (ref 97–108)
Creatinine, Ser: 0.85 mg/dL (ref 0.57–1.00)
GFR calc Af Amer: 95 mL/min/{1.73_m2} (ref >59)
Globulin, Total: 2.5 g/dL (ref 1.5–4.5)
Glucose: 75 mg/dL (ref 65–99)
Total Bilirubin: 0.2 mg/dL (ref 0.0–1.2)
Total Protein: 6.9 g/dL (ref 6.0–8.5)

## 2012-10-26 LAB — LAMOTRIGINE LEVEL: Lamotrigine Lvl: 8.1 ug/mL (ref 2.0–20.0)

## 2012-10-30 NOTE — Progress Notes (Signed)
Quick Note:  I called pt and gave the results of labs to pt (unremarkable, slightly low WBC (stable). She verbalized understanding. ______

## 2012-10-31 ENCOUNTER — Ambulatory Visit (INDEPENDENT_AMBULATORY_CARE_PROVIDER_SITE_OTHER): Payer: Medicare Other | Admitting: Psychiatry

## 2012-10-31 ENCOUNTER — Encounter (HOSPITAL_COMMUNITY): Payer: Self-pay | Admitting: Psychiatry

## 2012-10-31 VITALS — BP 111/78 | HR 84 | Ht 67.0 in | Wt 159.0 lb

## 2012-10-31 DIAGNOSIS — F339 Major depressive disorder, recurrent, unspecified: Secondary | ICD-10-CM

## 2012-10-31 DIAGNOSIS — F329 Major depressive disorder, single episode, unspecified: Secondary | ICD-10-CM

## 2012-10-31 MED ORDER — FLUOXETINE HCL 40 MG PO CAPS: ORAL_CAPSULE | ORAL | Status: DC
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
## 2012-11-04 ENCOUNTER — Emergency Department (HOSPITAL_COMMUNITY)
Admission: EM | Admit: 2012-11-04 | Discharge: 2012-11-04 | Disposition: A | Discharge status: Home / Self Care | Payer: Medicare Other | Attending: Emergency Medicine | Admitting: Emergency Medicine

## 2012-11-04 ENCOUNTER — Emergency Department (HOSPITAL_COMMUNITY): Payer: Medicare Other

## 2012-11-04 ENCOUNTER — Encounter (HOSPITAL_COMMUNITY): Payer: Self-pay | Admitting: *Deleted

## 2012-11-04 DIAGNOSIS — G40909 Epilepsy, unspecified, not intractable, without status epilepticus: Secondary | ICD-10-CM | POA: Insufficient documentation

## 2012-11-04 DIAGNOSIS — F3289 Other specified depressive episodes: Secondary | ICD-10-CM | POA: Insufficient documentation

## 2012-11-04 DIAGNOSIS — F411 Generalized anxiety disorder: Secondary | ICD-10-CM | POA: Insufficient documentation

## 2012-11-04 DIAGNOSIS — Z8614 Personal history of Methicillin resistant Staphylococcus aureus infection: Secondary | ICD-10-CM | POA: Insufficient documentation

## 2012-11-04 DIAGNOSIS — R209 Unspecified disturbances of skin sensation: Secondary | ICD-10-CM | POA: Insufficient documentation

## 2012-11-04 DIAGNOSIS — Z8739 Personal history of other diseases of the musculoskeletal system and connective tissue: Secondary | ICD-10-CM | POA: Insufficient documentation

## 2012-11-04 DIAGNOSIS — R609 Edema, unspecified: Secondary | ICD-10-CM | POA: Insufficient documentation

## 2012-11-04 DIAGNOSIS — M79604 Pain in right leg: Secondary | ICD-10-CM

## 2012-11-04 DIAGNOSIS — Z87891 Personal history of nicotine dependence: Secondary | ICD-10-CM | POA: Insufficient documentation

## 2012-11-04 DIAGNOSIS — E079 Disorder of thyroid, unspecified: Secondary | ICD-10-CM | POA: Insufficient documentation

## 2012-11-04 DIAGNOSIS — Z79899 Other long term (current) drug therapy: Secondary | ICD-10-CM | POA: Insufficient documentation

## 2012-11-04 DIAGNOSIS — Z85830 Personal history of malignant neoplasm of bone: Secondary | ICD-10-CM | POA: Insufficient documentation

## 2012-11-04 DIAGNOSIS — F329 Major depressive disorder, single episode, unspecified: Secondary | ICD-10-CM | POA: Insufficient documentation

## 2012-11-04 DIAGNOSIS — Z8679 Personal history of other diseases of the circulatory system: Secondary | ICD-10-CM | POA: Insufficient documentation

## 2012-11-04 DIAGNOSIS — M25559 Pain in unspecified hip: Secondary | ICD-10-CM | POA: Insufficient documentation

## 2012-11-04 LAB — BASIC METABOLIC PANEL
BUN: 7 mg/dL (ref 6–23)
Creatinine, Ser: 0.78 mg/dL (ref 0.50–1.10)
GFR calc non Af Amer: >90 mL/min (ref >90)
Glucose, Bld: 82 mg/dL (ref 70–99)
Potassium: 3.5 mEq/L (ref 3.5–5.1)

## 2012-11-04 LAB — CBC
HCT: 32.3 % — ABNORMAL LOW (ref 36.0–46.0)
Hemoglobin: 10 g/dL — ABNORMAL LOW (ref 12.0–15.0)
MCH: 27.9 pg (ref 26.0–34.0)
MCHC: 31 g/dL (ref 30.0–36.0)
MCV: 90 fL (ref 78.0–100.0)

## 2012-11-04 LAB — URINALYSIS, ROUTINE W REFLEX MICROSCOPIC
Specific Gravity, Urine: 1.036 — ABNORMAL HIGH (ref 1.005–1.030)
Urobilinogen, UA: 0.2 mg/dL (ref 0.0–1.0)

## 2012-11-04 LAB — URINE MICROSCOPIC-ADD ON

## 2012-11-04 LAB — SEDIMENTATION RATE: Sed Rate: 80 mm/hr — ABNORMAL HIGH (ref 0–22)

## 2012-11-04 LAB — C-REACTIVE PROTEIN: CRP: 34.2 mg/dL — ABNORMAL HIGH (ref <0.60)

## 2012-11-04 MED ORDER — CLINDAMYCIN HCL 150 MG PO CAPS: 300.0000 mg | ORAL_CAPSULE | Freq: Three times a day (TID) | ORAL | Status: DC

## 2012-11-04 MED ORDER — OXYCODONE-ACETAMINOPHEN 5-325 MG PO TABS
2.0000 | ORAL_TABLET | Freq: Once | ORAL | Status: AC
  Administered 2012-11-04: 2 via ORAL
  Filled 2012-11-04: qty 2

## 2012-11-04 MED ORDER — FUROSEMIDE 20 MG PO TABS
20.0000 mg | ORAL_TABLET | Freq: Once | ORAL | Status: AC
  Administered 2012-11-04: 20 mg via ORAL
  Filled 2012-11-04: qty 1

## 2012-11-04 MED ORDER — FENTANYL CITRATE 0.05 MG/ML IJ SOLN
50.0000 ug | Freq: Once | INTRAMUSCULAR | Status: AC
  Administered 2012-11-04: 50 ug via INTRAVENOUS
  Filled 2012-11-04: qty 2

## 2012-11-04 NOTE — ED Provider Notes (Signed)
CSN: [id number]     Arrival date & time 11/04/12  1027 History   First MD Initiated Contact with Patient 11/04/12 1111     Chief Complaint  Patient presents with  . Leg Pain   (Consider location/radiation/quality/duration/timing/severity/associated sxs/prior Treatment) HPI Comments: Cassandra Andrade is a(n) 47 y.o. Female with a complicated past medical history that includes anxiety, depression, history of seizures, osteomyelitis, history of osteosarcoma and thyroid disease.  She is status post right above-knee amputation performed in 2011.  She presents with chief complaint of pain in her right thigh stump.  She states that it began yesterday morning as an achy pain.  It has progressively worse than.  It is constant, throbbing, does not radiate, she had some associated fan come paresthesia of her foot.  She has noticed some swelling and heat.  She also has swelling in the left leg that is pitting.  She is unsure if she has ever had that before.  She denies fevers, chills, nausea, vomiting, lower back pain or urinary sxs.   Patient is a 47 y.o. female presenting with leg pain.  Leg Pain Location:  Leg Time since incident:  1 day Injury: no   Leg location:  R upper leg Pain details:    Quality:  Aching, throbbing and tingling   Severity:  Moderate   Onset quality:  Gradual   Duration:  36 hours   Timing:  Constant   Progression:  Worsening Relieved by:  None tried Exacerbated by: palpation. Associated symptoms: numbness   Associated symptoms: no back pain, no decreased ROM, no fatigue, no fever, no itching, no muscle weakness, no neck pain, no stiffness, no swelling and no tingling     Past Medical History  Diagnosis Date  . Anxiety   . Depression   . Seizures   . CVA (cerebrovascular accident due to intracerebral hemorrhage) 2004  . Arthritis   . MRSA (methicillin resistant Staphylococcus aureus)   . S/P BKA (below knee amputation) unilateral     Right   . Thyroid disease   .  Cancer     Osteosarcoma, right leg   Past Surgical History  Procedure Laterality Date  . Fracture surgery      Rt. femur, folllowed by MRSA  . Brain surgery  2004    cerebral aneurysm  . Total knee arthroplasty Right   . Above knee leg amputation Right     Osteosarcoma   Family History  Problem Relation Age of Onset  . Anxiety disorder Mother   . Diabetes Father    History  Substance Use Topics  . Smoking status: Former Games developer  . Smokeless tobacco: Never Used  . Alcohol Use: No   OB History   Grav Para Term Preterm Abortions TAB SAB Ect Mult Living                 Review of Systems  Constitutional: Negative for fever, chills and fatigue.  HENT: Negative for trouble swallowing and neck pain.   Respiratory: Negative for shortness of breath.   Cardiovascular: Positive for leg swelling. Negative for chest pain.  Gastrointestinal: Negative for nausea, vomiting, abdominal pain, diarrhea and constipation.  Genitourinary: Negative for dysuria and hematuria.  Musculoskeletal: Negative for myalgias, back pain, arthralgias and stiffness.  Skin: Negative for color change, itching, rash and wound.  Neurological: Positive for numbness.  All other systems reviewed and are negative.    Allergies  Vicodin and Morphine and related  Home Medications   Current Outpatient  Rx  Name  Route  Sig  Dispense  Refill  . Ascorbic Acid (VITAMIN C) 1000 MG tablet   Oral   Take 1,000 mg by mouth daily.           . carbamazepine (TEGRETOL) 200 MG tablet      TAKE 1 AND 1/2 TABLETS TWICE DAILY   90 tablet   5     Please Schedule Appt   . clonazePAM (KLONOPIN) 1 MG disintegrating tablet   Oral   Take 1 mg by mouth 3 (three) times daily as needed. For seizure.  PCP has suggested she might take 1/2 tab. PRN for the "butterflies".          [name] FLUoxetine (PROZAC) 40 MG capsule      Take 1 capsule daily   30 capsule   1   . lamoTRIgine (LAMICTAL) 200 MG tablet   Oral   Take 300  mg by mouth 2 (two) times daily.           [name] levothyroxine (SYNTHROID, LEVOTHROID) 25 MCG tablet   Oral   Take 25 mcg by mouth daily.         . promethazine (PHENERGAN) 25 MG tablet   Oral   Take 25 mg by mouth every 6 (six) hours as needed. nausea          BP 103/61  Pulse 90  Temp(Src) 98.2 F (36.8 C) (Oral)  Resp 16  SpO2 96% Physical Exam  Nursing note and vitals reviewed. Constitutional: She is oriented to person, place, and time. She appears well-developed and well-nourished. No distress.  Chronically ill appearing female in NAD.  HENT:  Head: Normocephalic and atraumatic.  Eyes: Conjunctivae are normal. No scleral icterus.  Neck: Normal range of motion. No JVD present.  Cardiovascular: Normal rate, regular rhythm and normal heart sounds.  Exam reveals no gallop and no friction rub.   No murmur heard. Pitting edema up to knee on the L leg.   Pulmonary/Chest: Effort normal and breath sounds normal. No respiratory distress. She has no wheezes.  Abdominal: Soft. Bowel sounds are normal. She exhibits no distension and no mass. There is no tenderness. There is no guarding.  Musculoskeletal:  R  AKA, mild erythema, mild TTP . No overt signs of infection.   Neurological: She is alert and oriented to person, place, and time.  Skin: Skin is warm and dry. She is not diaphoretic.    ED Course  Procedures (including critical care time) Labs Review Labs Reviewed - No data to display Imaging Review No results found.   Date: 11/04/2012  Rate: 82   Rhythm: normal sinus rhythm  QRS Axis: normal  Intervals: normal  ST/T Wave abnormalities: nonspecific T wave changes  Conduction Disutrbances:none  Narrative Interpretation:   Old EKG Reviewed: unchanged   MDM   1. Leg pain, right   2. Peripheral edema    11:55 AM BP 102/58  Pulse 85  Temp(Src) 98.3 F (36.8 C) (Oral)  Resp 14  SpO2 100% Patient here with c/o leg pain. Complicated past hx. She does not  appear to have cellulitis although eraly cellulitis is on the differential. She has leg swelling but denies hx of heart disease, cp, sob, PND or orthopnea. Likely venous stasis, but will get pro bnp, labs to evaluate. DDX includes early cellulitis, phantom limb pain, osteomyelitis. Pain control initiated  Sed/ rate /crp, basic labs and imaging of the femur pending  2:52 PM Patient  Labs  show proteinuria. She has been taking B/C powders many times a day. I have advised the patient NOT to take any more NSAIDS and have haer labs rechecked, BUN and creatinine are unremarkable. Patient EKG is unchanged from previous. Imaging shwos no signs of osteomelitis. i have personally reviewed the images using our pacs system. No signs of heart failure. Patient is unable to wear her prosthesis at this time due to swelling. She is given 20 PO lasix  And percocet for pain. I advised the patient to follow up with her pcp this week for worsening sxs as patient may need more advanced imaging such as MRI. She has no leukocytosis, fever. VSS, will treat patient with clindamycin for possible early cellulits.  The patient appears reasonably screened and/or stabilized for discharge and I doubt any other medical condition or other [facility] requiring further screening, evaluation, or treatment in the ED at this time prior to discharge.  Patient given lasix 20 PO at disb  Arthor Captain, PA-C 11/04/12 1502

## 2012-11-04 NOTE — ED Notes (Signed)
Reports having above knee amputation to right leg a while ago, started having pain and swelling to right leg yesterday. Denies any wounds or drainage. No acute distress noted at triage.

## 2012-11-04 NOTE — ED Notes (Signed)
Patient attempted to void on bedside commode. Patient could not urinate.

## 2012-11-04 NOTE — Discharge Instructions (Signed)
Your  Labs and imaging showed no major abnormalities. You may be developing a small infection, I would like to discharge you with antibiotics to cover for infection. Please follow up with your primary care doctor this week. If your symptoms are worsening, you may need to get more detailed imaging of the leg such as as MRI or CT scan. Please take all of your antibiotics until finished!   You may develop abdominal discomfort or diarrhea from the antibiotic.  You may help offset this with probiotics which you can buy or get in yogurt. Do not eat  or take the probiotics until 2 hours after your antibiotic.  Use tyleonol or advil for pain   HOME CARE INSTRUCTIONS   Take your antibiotics as directed. Finish them even if you start to feel better.  Keep the infected arm or leg elevated to reduce swelling.  Apply a warm cloth to the affected area up to 4 times per day to relieve pain.  Only take over-the-counter or prescription medicines for pain, discomfort, or fever as directed by your caregiver.  Keep all follow-up appointments as directed by your caregiver. SEEK MEDICAL CARE IF:   You notice red streaks coming from the infected area.  Your red area gets larger or turns dark in color.  Your bone or joint underneath the infected area becomes painful after the skin has healed.  Your infection returns in the same area or another area.  You notice a swollen bump in the infected area.  You develop new symptoms. SEEK IMMEDIATE MEDICAL CARE IF:   You have a fever.  You feel very sleepy.  You develop vomiting or diarrhea.  You have a general ill feeling (malaise) with muscle aches and pains.

## 2012-11-04 NOTE — ED Provider Notes (Signed)
Medical screening examination/treatment/procedure(s) were performed by non-physician practitioner and as supervising physician I was immediately available for consultation/collaboration.   Gavin Pound. Zaylin Pistilli, MD 11/04/12 3086

## 2012-11-04 NOTE — ED Notes (Signed)
Pa Abigail at bedside

## 2012-11-04 NOTE — ED Notes (Signed)
Pt reports 7/10 aching, throbbing pain to right leg since last night. Denies pain at incision sites, but diffuse pain on palpation. Mild swelling and redness noted, but no drainage noted. Denies fever/chills.

## 2012-11-04 NOTE — ED Notes (Signed)
PA at bedside.

## 2012-11-06 ENCOUNTER — Inpatient Hospital Stay (HOSPITAL_COMMUNITY)
Admission: EM | Admit: 2012-11-06 | Discharge: 2012-11-07 | DRG: 300 | Disposition: A | Discharge status: Home / Self Care | Payer: Medicare Other | Attending: Internal Medicine | Admitting: Internal Medicine

## 2012-11-06 ENCOUNTER — Encounter (HOSPITAL_COMMUNITY): Payer: Self-pay | Admitting: *Deleted

## 2012-11-06 DIAGNOSIS — F329 Major depressive disorder, single episode, unspecified: Secondary | ICD-10-CM | POA: Diagnosis present

## 2012-11-06 DIAGNOSIS — I69959 Hemiplegia and hemiparesis following unspecified cerebrovascular disease affecting unspecified side: Secondary | ICD-10-CM | POA: Diagnosis present

## 2012-11-06 DIAGNOSIS — G40909 Epilepsy, unspecified, not intractable, without status epilepticus: Secondary | ICD-10-CM | POA: Diagnosis present

## 2012-11-06 DIAGNOSIS — Z87891 Personal history of nicotine dependence: Secondary | ICD-10-CM

## 2012-11-06 DIAGNOSIS — M129 Arthropathy, unspecified: Secondary | ICD-10-CM | POA: Diagnosis present

## 2012-11-06 DIAGNOSIS — I82403 Acute embolism and thrombosis of unspecified deep veins of lower extremity, bilateral: Secondary | ICD-10-CM

## 2012-11-06 DIAGNOSIS — Z79899 Other long term (current) drug therapy: Secondary | ICD-10-CM

## 2012-11-06 DIAGNOSIS — Z8614 Personal history of Methicillin resistant Staphylococcus aureus infection: Secondary | ICD-10-CM

## 2012-11-06 DIAGNOSIS — I82409 Acute embolism and thrombosis of unspecified deep veins of unspecified lower extremity: Principal | ICD-10-CM | POA: Diagnosis present

## 2012-11-06 DIAGNOSIS — F411 Generalized anxiety disorder: Secondary | ICD-10-CM | POA: Diagnosis present

## 2012-11-06 DIAGNOSIS — F3289 Other specified depressive episodes: Secondary | ICD-10-CM | POA: Diagnosis present

## 2012-11-06 DIAGNOSIS — S78119A Complete traumatic amputation at level between unspecified hip and knee, initial encounter: Secondary | ICD-10-CM

## 2012-11-06 DIAGNOSIS — E039 Hypothyroidism, unspecified: Secondary | ICD-10-CM | POA: Diagnosis present

## 2012-11-06 DIAGNOSIS — Z85830 Personal history of malignant neoplasm of bone: Secondary | ICD-10-CM

## 2012-11-06 LAB — BASIC METABOLIC PANEL
BUN: 11 mg/dL (ref 6–23)
CO2: 31 mEq/L (ref 19–32)
Calcium: 10.3 mg/dL (ref 8.4–10.5)
Chloride: 100 mEq/L (ref 96–112)
Creatinine, Ser: 0.89 mg/dL (ref 0.50–1.10)
GFR calc Af Amer: 89 mL/min — ABNORMAL LOW (ref >90)
GFR calc non Af Amer: 77 mL/min — ABNORMAL LOW (ref >90)
Glucose, Bld: 96 mg/dL (ref 70–99)
Potassium: 4.1 mEq/L (ref 3.5–5.1)
Sodium: 141 mEq/L (ref 135–145)

## 2012-11-06 LAB — CBC
HCT: 32 % — ABNORMAL LOW (ref 36.0–46.0)
Hemoglobin: 10.3 g/dL — ABNORMAL LOW (ref 12.0–15.0)
MCH: 28.8 pg (ref 26.0–34.0)
MCHC: 32.2 g/dL (ref 30.0–36.0)
MCV: 89.4 fL (ref 78.0–100.0)
Platelets: 220 10*3/uL (ref 150–400)
RBC: 3.58 MIL/uL — ABNORMAL LOW (ref 3.87–5.11)
RDW: 13.7 % (ref 11.5–15.5)
WBC: 4.7 10*3/uL (ref 4.0–10.5)

## 2012-11-06 LAB — APTT: aPTT: 40 seconds — ABNORMAL HIGH (ref 24–37)

## 2012-11-06 LAB — PROTIME-INR
INR: 1.13 (ref 0.00–1.49)
Prothrombin Time: 14.3 seconds (ref 11.6–15.2)

## 2012-11-06 NOTE — ED Notes (Signed)
IV team at bedside 

## 2012-11-06 NOTE — ED Notes (Signed)
This RN attempted IV x 1. Had pt 2 days ago, pt very difficult stick. IV team paged.

## 2012-11-06 NOTE — ED Notes (Signed)
Pt sent here by pcp for multiple dvt's to L leg (R leg aka).  Pt states US was done this evening, though unable to see results in epic.

## 2012-11-06 NOTE — ED Provider Notes (Signed)
CSN: [id number]     Arrival date & time 11/06/12  1733 History   First MD Initiated Contact with Patient 11/06/12 2058     Chief Complaint  Patient presents with  . DVT   (Consider location/radiation/quality/duration/timing/severity/associated sxs/prior Treatment) HPI 66 YOF with hx of an anerysm, stroke, and osteosarcoma s/p right lower leg amputation presents to the emergency room with bilateral lower leg swelling and pain. Pain and swelling began 2 weeks ago; went to see PCP 2 days ago who referred her for Doppler. Doppler revealed b/l complicated DVTs. Pt says swelling has progressively gotten worse. Pain is worse in the left medial knee area. Pt has tried [employer] and Tylenol which has helped some with the pain. Pt has had decreased activity, remaining primarily in her wheelchair. Denies tobacco use, oral contraceptives, tobacco use, chest pain, heart palpitations, shortness of breath, headache, syncope, dizziness, loss of consciousness, weakness, sensory changes, abdominal pain, fever, and chill. Nothing makes the swelling better. Past Medical History  Diagnosis Date  . Anxiety   . Depression   . Seizures   . CVA (cerebrovascular accident due to intracerebral hemorrhage) 2004  . Arthritis   . MRSA (methicillin resistant Staphylococcus aureus)   . S/P BKA (below knee amputation) unilateral     Right   . Thyroid disease   . Cancer     Osteosarcoma, right leg   Past Surgical History  Procedure Laterality Date  . Fracture surgery      Rt. femur, folllowed by MRSA  . Brain surgery  2004    cerebral aneurysm  . Total knee arthroplasty Right   . Above knee leg amputation Right     Osteosarcoma   Family History  Problem Relation Age of Onset  . Anxiety disorder Mother   . Diabetes Father    History  Substance Use Topics  . Smoking status: Former Games developer  . Smokeless tobacco: Never Used  . Alcohol Use: No   OB History   Grav Para Term Preterm Abortions TAB SAB Ect Mult  Living                 Review of Systems All other systems negative except as documented in the HPI. All pertinent positives and negatives as reviewed in the HPI.  Allergies  Vicodin and Morphine and related  Home Medications   Current Outpatient Rx  Name  Route  Sig  Dispense  Refill  . acetaminophen (TYLENOL) 500 MG tablet   Oral   Take 1,000 mg by mouth every 6 (six) hours as needed for pain.         . Aspirin-Salicylamide-Caffeine (BC HEADACHE POWDER PO)   Oral   Take 1 packet by mouth daily as needed (pain , headaches).          . Calcium Carbonate-Vitamin D (CALCIUM + D PO)   Oral   Take 1 tablet by mouth daily.         . carbamazepine (TEGRETOL) 200 MG tablet   Oral   Take 300 mg by mouth 2 (two) times daily.         . clonazePAM (KLONOPIN) 1 MG disintegrating tablet   Oral   Take 1 mg by mouth 3 (three) times daily as needed. For seizure.  PCP has suggested she might take 1/2 tab. PRN for the "butterflies".          [name] FLUoxetine (PROZAC) 40 MG capsule      Take 1 capsule daily  30 capsule   1   . lamoTRIgine (LAMICTAL) 200 MG tablet   Oral   Take 300 mg by mouth 2 (two) times daily.           [name] levothyroxine (SYNTHROID, LEVOTHROID) 25 MCG tablet   Oral   Take 25 mcg by mouth daily.         . promethazine (PHENERGAN) 25 MG tablet   Oral   Take 25 mg by mouth every 6 (six) hours as needed. nausea         . trolamine salicylate (ASPERCREME) 10 % cream   Topical   Apply 1 application topically as needed (pain).         . clindamycin (CLEOCIN) 150 MG capsule   Oral   Take 2 capsules (300 mg total) by mouth 3 (three) times daily.   60 capsule   0    BP 110/58  Pulse 80  Temp(Src) 98.3 F (36.8 C) (Oral)  Resp 18  Ht 5\' 7"  (1.702 m)  Wt 160 lb 12.8 oz (72.938 kg)  BMI 25.18 kg/m2  SpO2 100%  LMP 10/30/2012 Physical Exam  Constitutional: She is oriented to person, place, and time. Vital signs are normal. She appears  well-developed and well-nourished.  HENT:  Head: Normocephalic and atraumatic.  Cardiovascular: Normal rate, regular rhythm and normal heart sounds.  Exam reveals decreased pulses (unable to palpate left lower extremity pulses due to edema. ).   Pulmonary/Chest: Effort normal and breath sounds normal. No respiratory distress. She has no decreased breath sounds. She has no wheezes. She exhibits no tenderness.  Musculoskeletal: She exhibits edema and tenderness.       Right knee: She exhibits deformity (right leg amputated above the knee).       Left knee: She exhibits decreased range of motion (due to edema) and swelling (swelling and edema up to hip.). She exhibits no deformity, no erythema and no bony tenderness. Tenderness (tender to palpation of left medial popliteal space) found.       Left ankle: She exhibits swelling. No tenderness.  Neurological: She is alert and oriented to person, place, and time.  Skin: Skin is warm and dry. No rash noted. No erythema.  Pt lower extremity skin is flaking due to edema. Left leg is firm to palpation with no pulses able to be palpated. No temperature or hair changes noted.  Psychiatric: She has a normal mood and affect. Her behavior is normal. Judgment and thought content normal.    ED Course  Procedures (including critical care time) Labs Review Labs Reviewed  CBC  BASIC METABOLIC PANEL  PROTIME-INR  APTT   Patient will be admitted for further care for her complex DVTs. The patient is stable here in the ER. The patient has multiple medical problems as well.   MDM  MDM Reviewed: nursing note, vitals and previous chart Reviewed previous: labs Interpretation: labs Consults: admitting MD        Carlyle Dolly, PA-C 11/07/12 0013

## 2012-11-07 DIAGNOSIS — I82403 Acute embolism and thrombosis of unspecified deep veins of lower extremity, bilateral: Secondary | ICD-10-CM | POA: Diagnosis present

## 2012-11-07 DIAGNOSIS — I82409 Acute embolism and thrombosis of unspecified deep veins of unspecified lower extremity: Principal | ICD-10-CM

## 2012-11-07 LAB — BASIC METABOLIC PANEL
BUN: 9 mg/dL (ref 6–23)
CO2: 27 mEq/L (ref 19–32)
GFR calc non Af Amer: >90 mL/min (ref >90)
Glucose, Bld: 108 mg/dL — ABNORMAL HIGH (ref 70–99)
Potassium: 3.7 mEq/L (ref 3.5–5.1)
Sodium: 139 mEq/L (ref 135–145)

## 2012-11-07 LAB — CBC
HCT: 29.9 % — ABNORMAL LOW (ref 36.0–46.0)
Hemoglobin: 9.4 g/dL — ABNORMAL LOW (ref 12.0–15.0)
MCHC: 31.4 g/dL (ref 30.0–36.0)
RBC: 3.33 MIL/uL — ABNORMAL LOW (ref 3.87–5.11)

## 2012-11-07 LAB — CARBAMAZEPINE LEVEL, TOTAL: Carbamazepine Lvl: 8.6 ug/mL (ref 4.0–12.0)

## 2012-11-07 MED ORDER — WARFARIN SODIUM 10 MG PO TABS
10.0000 mg | ORAL_TABLET | Freq: Once | ORAL | Status: AC
  Administered 2012-11-07: 10 mg via ORAL
  Filled 2012-11-07: qty 1

## 2012-11-07 MED ORDER — LEVOTHYROXINE SODIUM 25 MCG PO TABS
25.0000 ug | ORAL_TABLET | Freq: Every day | ORAL | Status: DC
  Administered 2012-11-07: 25 ug via ORAL
  Filled 2012-11-07 (×2): qty 1

## 2012-11-07 MED ORDER — CLONAZEPAM 0.5 MG PO TBDP: 1.0000 mg | ORAL_TABLET | Freq: Three times a day (TID) | ORAL | Status: DC | PRN

## 2012-11-07 MED ORDER — ENOXAPARIN SODIUM 100 MG/ML ~~LOC~~ SOLN: 85.0000 mg | Freq: Two times a day (BID) | SUBCUTANEOUS | Status: DC

## 2012-11-07 MED ORDER — PROMETHAZINE HCL 25 MG PO TABS: 25.0000 mg | ORAL_TABLET | Freq: Four times a day (QID) | ORAL | Status: DC | PRN

## 2012-11-07 MED ORDER — FLUOXETINE HCL 20 MG PO CAPS
40.0000 mg | ORAL_CAPSULE | Freq: Every day | ORAL | Status: DC
  Administered 2012-11-07: 40 mg via ORAL
  Filled 2012-11-07: qty 2

## 2012-11-07 MED ORDER — ONDANSETRON 4 MG PO TBDP
8.0000 mg | ORAL_TABLET | Freq: Once | ORAL | Status: AC
  Administered 2012-11-07: 8 mg via ORAL
  Filled 2012-11-07: qty 2

## 2012-11-07 MED ORDER — WARFARIN VIDEO
Freq: Once | Status: AC
  Administered 2012-11-07: 11:00:00

## 2012-11-07 MED ORDER — WARFARIN - PHARMACIST DOSING INPATIENT
Freq: Every day | Status: DC
  Administered 2012-11-07: 18:00:00

## 2012-11-07 MED ORDER — LORAZEPAM 2 MG/ML IJ SOLN
INTRAMUSCULAR | Status: AC
  Administered 2012-11-07: 1 mg via INTRAVENOUS
  Filled 2012-11-07: qty 1

## 2012-11-07 MED ORDER — WARFARIN SODIUM 5 MG PO TABS: 5.0000 mg | ORAL_TABLET | Freq: Every day | ORAL | Status: DC

## 2012-11-07 MED ORDER — LORAZEPAM 2 MG/ML IJ SOLN: 2.0000 mg | INTRAMUSCULAR | Status: DC | PRN

## 2012-11-07 MED ORDER — CLONAZEPAM 1 MG PO TABS
1.0000 mg | ORAL_TABLET | Freq: Three times a day (TID) | ORAL | Status: DC | PRN
  Administered 2012-11-07: 1 mg via ORAL
  Filled 2012-11-07: qty 1

## 2012-11-07 MED ORDER — ENOXAPARIN SODIUM 100 MG/ML ~~LOC~~ SOLN
85.0000 mg | Freq: Two times a day (BID) | SUBCUTANEOUS | Status: DC
  Administered 2012-11-07: 85 mg via SUBCUTANEOUS
  Filled 2012-11-07 (×2): qty 1

## 2012-11-07 MED ORDER — COUMADIN BOOK
Freq: Once | Status: AC
  Administered 2012-11-07: 11:00:00
  Filled 2012-11-07: qty 1

## 2012-11-07 MED ORDER — LAMOTRIGINE 150 MG PO TABS
300.0000 mg | ORAL_TABLET | Freq: Two times a day (BID) | ORAL | Status: DC
  Administered 2012-11-07: 300 mg via ORAL
  Filled 2012-11-07 (×2): qty 2

## 2012-11-07 MED ORDER — ACETAMINOPHEN 500 MG PO TABS
1000.0000 mg | ORAL_TABLET | Freq: Four times a day (QID) | ORAL | Status: DC | PRN
  Administered 2012-11-07 (×2): 1000 mg via ORAL
  Filled 2012-11-07 (×2): qty 2

## 2012-11-07 MED ORDER — SODIUM CHLORIDE 0.9 % IJ SOLN
3.0000 mL | Freq: Two times a day (BID) | INTRAMUSCULAR | Status: DC
  Administered 2012-11-07: 3 mL via INTRAVENOUS

## 2012-11-07 MED ORDER — SODIUM CHLORIDE 0.9 % IV SOLN: INTRAVENOUS | Status: DC

## 2012-11-07 MED ORDER — CARBAMAZEPINE 200 MG PO TABS
300.0000 mg | ORAL_TABLET | Freq: Two times a day (BID) | ORAL | Status: DC
  Administered 2012-11-07: 300 mg via ORAL
  Filled 2012-11-07 (×2): qty 1.5

## 2012-11-07 MED ORDER — LORAZEPAM 2 MG/ML IJ SOLN
1.0000 mg | Freq: Once | INTRAMUSCULAR | Status: DC
Start: 2012-11-07 — End: 2012-11-07

## 2012-11-07 NOTE — Discharge Summary (Signed)
[facility]                                                                                   Cassandra Andrade, is a 47 y.o. female  DOB Feb 03, 1966  MRN [id number].  Admission date:  11/06/2012  Admitting Physician  Hillary Bow, DO  Discharge Date:  11/07/2012   Primary MD  Joneen Roach, MD  Admission Diagnosis  DVT (deep venous thrombosis), bilateral [453.40]  Discharge Diagnosis     Principal Problem:   DVT, bilateral lower limbs   Past Medical History  Diagnosis Date  . Anxiety   . Depression   . Seizures   . CVA (cerebrovascular accident due to intracerebral hemorrhage) 2004  . Arthritis   . MRSA (methicillin resistant Staphylococcus aureus)   . S/P BKA (below knee amputation) unilateral     Right   . Thyroid disease   . Cancer     Osteosarcoma, right leg    Past Surgical History  Procedure Laterality Date  . Fracture surgery      Rt. femur, folllowed by MRSA  . Brain surgery  2004    cerebral aneurysm  . Total knee arthroplasty Right   . Above knee leg amputation Right     Osteosarcoma     Recommendations for primary care physician for things to follow:   Monitor INR closely, dose Lovenox and Coumadin appropriately.   Discharge Diagnoses:   Principal Problem:   DVT, bilateral lower limbs    Discharge Condition: stable   Follow-up Information   Follow up with Joneen Roach, MD. Schedule an appointment as soon as possible for a visit in 2 days.   Contact information:   [address] [place] [address] [phone]         Consults obtained - none   Discharge Medications      Medication List         acetaminophen 500 MG tablet  Commonly known as:  TYLENOL  Take 1,000 mg by mouth every 6 (six) hours as needed for pain.     BC HEADACHE POWDER PO  Take 1 packet by mouth daily as needed (pain , headaches).     CALCIUM + D PO  Take 1 tablet by mouth daily.     carbamazepine 200 MG tablet   Commonly known as:  TEGRETOL  Take 300 mg by mouth 2 (two) times daily.     clindamycin 150 MG capsule  Commonly known as:  CLEOCIN  Take 2 capsules (300 mg total) by mouth 3 (three) times daily.     clonazePAM 1 MG disintegrating tablet  Commonly known as:  KLONOPIN  Take 1 mg by mouth 3 (three) times daily as needed. For seizure.  PCP has suggested she might take 1/2 tab. PRN for the "butterflies".     enoxaparin 100 MG/ML injection  Commonly known as:  LOVENOX  Inject 0.85 mLs (85 mg total) into the skin every 12 (twelve) hours. Please get her INR checked in 2 days by primary care physician and get Lovenox and Coumadin dose adjusted     FLUoxetine 40 MG capsule  Commonly  known as:  PROZAC  Take 1 capsule daily     lamoTRIgine 200 MG tablet  Commonly known as:  LAMICTAL  Take 300 mg by mouth 2 (two) times daily.     levothyroxine 25 MCG tablet  Commonly known as:  SYNTHROID, LEVOTHROID  Take 25 mcg by mouth daily.     promethazine 25 MG tablet  Commonly known as:  PHENERGAN  Take 25 mg by mouth every 6 (six) hours as needed. nausea     trolamine salicylate 10 % cream  Commonly known as:  ASPERCREME  Apply 1 application topically as needed (pain).     warfarin 5 MG tablet  Commonly known as:  COUMADIN  Take 1 tablet (5 mg total) by mouth daily. Please get her INR checked in 2 days by primary care physician and get Lovenox and Coumadin dose adjusted         Diet and Activity recommendation: See Discharge Instructions below   Discharge Instructions     Follow with Primary MD Joneen Roach, MD in 2 days   Get CBC, CMP, INR checked 2 days by Primary MD and again as instructed by your Primary MD.   Get your Coumadin and Lovenox dose adjusted.   Get Medicines reviewed and adjusted.  Please request your Prim.MD to go over all Hospital Tests and Procedure/Radiological results at the follow up, please get all Hospital records sent to your Prim MD by signing  hospital release before you go home.  Activity: As tolerated with Full fall precautions use walker/cane & assistance as needed   Diet:  Heart healthy  For Heart failure patients - Check your Weight same time everyday, if you gain over 2 pounds, or you develop in leg swelling, experience more shortness of breath or chest pain, call your Primary MD immediately. Follow Cardiac Low Salt Diet and 1.8 lit/day fluid restriction.  Disposition Home    If you experience worsening of your admission symptoms, develop shortness of breath, life threatening emergency, suicidal or homicidal thoughts you must seek medical attention immediately by calling 911 or calling your MD immediately  if symptoms less severe.  You Must read complete instructions/literature along with all the possible adverse reactions/side effects for all the Medicines you take and that have been prescribed to you. Take any new Medicines after you have completely understood and accpet all the possible adverse reactions/side effects.   Do not drive and provide baby sitting services if your were admitted for syncope or siezures until you have seen by Primary MD or a Neurologist and advised to do so again.  Do not drive when taking Pain medications.    Do not take more than prescribed Pain, Sleep and Anxiety Medications  Special Instructions: If you have smoked or chewed Tobacco  in the last 2 yrs please stop smoking, stop any regular Alcohol  and or any Recreational drug use.  Wear Seat belts while driving.   Please note  You were cared for by a hospitalist during your hospital stay. If you have any questions about your discharge medications or the care you received while you were in the hospital after you are discharged, you can call the unit and asked to speak with the hospitalist on call if the hospitalist that took care of you is not available. Once you are discharged, your primary care physician will handle any further medical  issues. Please note that NO REFILLS for any discharge medications will be authorized once you are discharged, as  it is imperative that you return to your primary care physician (or establish a relationship with a primary care physician if you do not have one) for your aftercare needs so that they can reassess your need for medications and monitor your lab values.   Major procedures and Radiology Reports - PLEASE review detailed and final reports for all details, in brief -   Bilateral lower extremity ultrasound done at PCP office showing bilateral DVTs   Dg Femur Right  11/04/2012   CLINICAL DATA:  Fall, history of osteosarcoma, below-the-knee amputation, pain and swelling in right femur.  EXAM: RIGHT FEMUR - 2 VIEW  COMPARISON:  12/25/2009  FINDINGS: Four views of the right femur submitted. The hip joint is located. No acute fracture or subluxation. The patient is status post amputation of right foot, right knee and an distal right femur. The remaining 2 /3rd proximal femur shows no acute fracture or subluxation. No definite bone destruction to suggest osteomyelitis. No radiopaque foreign bodies noted within soft tissue. Partially visualized prior surgical changes mid femoral shaft  IMPRESSION: The patient is status post amputation of right foot, right knee and distal right femur. The remaining 2 /3rd proximal femur shows no acute fracture or subluxation. No definite bone destruction to suggest osteomyelitis. No radiopaque foreign bodies noted within soft tissue. Partially visualized prior surgical changes mid femoral shaft   Electronically Signed   By: Natasha Mead   On: 11/04/2012 14:00    Micro Results      Recent Results (from the past 240 hour(s))  MRSA PCR SCREENING     Status: None   Collection Time    11/07/12  4:56 AM      Result Value Range Status   MRSA by PCR NEGATIVE  NEGATIVE Final   Comment:            The GeneXpert MRSA Assay (FDA     approved for NASAL specimens     only), is  one component of a     comprehensive MRSA colonization     surveillance program. It is not     intended to diagnose MRSA     infection nor to guide or     monitor treatment for     MRSA infections.     History of present illness and  Hospital Course:     Kindly see H&P for history of present illness and admission details, please review complete Labs, Consult reports and Test reports for all details in brief Cassandra Andrade, is a 47 y.o. 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    She was subsequently sent to the [facility] [place] through which she was admitted to the hospital. In the hospital patient and family both received Coumadin Lovenox treatment, since she is on Tegretol she could not be started on xaralto due to severe interaction, she will be now started on Lovenox Coumadin overlap and discharged home. They want to follow with primary care physician, will request primary care physician to please monitor INR closely and adjust Coumadin Lovenox dose appropriately. We recommend a total of 5 day overlap of Lovenox and Coumadin.   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 She will also follow with her primary care physician within 2 days. Written instructions have been provided.    Today   Subjective:   Cassandra Andrade today has no headache,no chest abdominal pain,no new weakness tingling or numbness, feels much better wants to go home today.    Objective:    Blood pressure 105/65, pulse 93, temperature 98 F (36.7 C), temperature source Oral, resp. rate 18, height 5\' 7"  (1.702 m), weight 84.641 kg (186 lb 9.6 oz), last menstrual period 10/30/2012, SpO2 97.00%.   Intake/Output Summary (Last 24 hours) at 11/07/12 1514 Last data filed at 11/07/12 1300  Gross per 24 hour  Intake    480 ml  Output    250 ml  Net    230 ml    Exam Awake Alert, Oriented *3, No new F.N deficits, Normal affect [place].AT,PERRAL Supple Neck,No JVD, No cervical lymphadenopathy appriciated.  Symmetrical Chest wall movement, Good air movement bilaterally, CTAB RRR,No Gallops,Rubs or new Murmurs, No Parasternal Heave +ve B.Sounds, Abd Soft, Non tender, No organomegaly appriciated, No rebound -guarding or rigidity. No Cyanosis, Clubbing , right AKA, right thigh has 2+ edema, left leg is 3+ edema  Data Review   Lab Results  Component Value Date   INR 1.13 11/06/2012   INR 1.04 05/25/2010   INR 1.25 02/12/2010     CBC w Diff: Lab Results  Component Value Date   WBC 4.8 11/07/2012   WBC 3.3* 10/24/2012   HGB 9.4* 11/07/2012   HCT 29.9* 11/07/2012   PLT 208 11/07/2012   LYMPHOPCT 44 09/07/2011   MONOPCT 9 09/07/2011   EOSPCT 1 09/07/2011   BASOPCT 0 09/07/2011    CMP: Lab Results  Component Value Date   NA 139 11/07/2012   NA 139 10/24/2012   K 3.7 11/07/2012   CL 99 11/07/2012   CO2 27 11/07/2012   BUN 9 11/07/2012   BUN 11 10/24/2012   CREATININE 0.71 11/07/2012   PROT 6.9 10/24/2012   PROT 7.0 06/07/2011   ALBUMIN 3.5 06/07/2011   BILITOT 0.2 10/24/2012   ALKPHOS 110 10/24/2012   AST 17 10/24/2012   ALT 13 10/24/2012  .   Total Time in preparing paper work, data evaluation and todays exam - 35 minutes  Leroy Sea M.D on 11/07/2012 at 3:14 PM  [facility] Office  [phone]

## 2012-11-07 NOTE — Consult Note (Signed)
ANTICOAGULATION CONSULT NOTE - Initial Consult  Pharmacy Consult for Lovenox + Coumadin Indication: DVT  Allergies  Allergen Reactions  . Vicodin [Hydrocodone-Acetaminophen] Nausea And Vomiting  . Morphine And Related Rash    Allergic reaction only in iv form    Patient Measurements: Height: 5\' 7"  (170.2 cm) Weight: 186 lb 9.6 oz (84.641 kg) IBW/kg (Calculated) : 61.6  Vital Signs: Temp: 98.1 F (36.7 C) (09/16 0357) Temp src: Oral (09/16 0357) BP: 100/73 mmHg (09/16 0708) Pulse Rate: 84 (09/16 0643)  Labs:  Recent Labs  11/04/12 1227 11/06/12 2230 11/07/12 0850  HGB 10.0* 10.3* 9.4*  HCT 32.3* 32.0* 29.9*  PLT 223 220 208  APTT  --  40*  --   LABPROT  --  14.3  --   INR  --  1.13  --   CREATININE 0.78 0.89 0.71    Estimated Creatinine Clearance: 98.2 ml/min (by C-G formula based on Cr of 0.71).   Medical History: Past Medical History  Diagnosis Date  . Anxiety   . Depression   . Seizures   . CVA (cerebrovascular accident due to intracerebral hemorrhage) 2004  . Arthritis   . MRSA (methicillin resistant Staphylococcus aureus)   . S/P BKA (below knee amputation) unilateral     Right   . Thyroid disease   . Cancer     Osteosarcoma, right leg   Assessment: 47yo who presents from her PCP's office with extensive BLE DVT's. Initial consideration for xarelto, but patient is on tegretol which is contraindicated with xarelto, so she will begin lovenox and coumadin. Baseline INR 1.13, coumadin score =8, weight = 85kg, and CrCl 59ml/min.  Goal of Therapy:  INR 2-3 Anti-Xa level 0.6-1.2 units/ml 4hrs after LMWH dose given Monitor platelets by anticoagulation protocol: Yes   Plan:  1) Lovenox 85mg  sq q12 2) Coumadin 10mg  x 1 3) Daily INR, CBC q72h 4) Coumadin education - book/video  Fredrik Rigger 11/07/2012,10:30 AM

## 2012-11-07 NOTE — ED Provider Notes (Signed)
Medical screening examination/treatment/procedure(s) were performed by non-physician practitioner and as supervising physician I was immediately available for consultation/collaboration.  Flint Melter, MD 11/07/12 [phone]

## 2012-11-07 NOTE — Progress Notes (Signed)
Pt/family given discharge instructions, medication lists, follow up appointments, and when to call the doctor.  Pt/family verbalizes understanding. Pt/ family given instructions on lovenox injections and pt demonstrated correctly. Cassandra Andrade

## 2012-11-07 NOTE — ED Notes (Signed)
Pt transported to [facility].

## 2012-11-07 NOTE — ED Provider Notes (Signed)
Medical screening examination/treatment/procedure(s) were performed by non-physician practitioner and as supervising physician I was immediately available for consultation/collaboration.  Flint Melter, MD 11/07/12 [phone]

## 2012-11-07 NOTE — Progress Notes (Signed)
[facility]                                                                                Patient Demographics  Cassandra Andrade, is a 47 y.o. female, DOB - June 07, 1965, ZOX:[id number]  Admit date - 11/06/2012   Admitting Physician Hillary Bow, DO  Outpatient Primary MD for the patient is Joneen Roach, MD  LOS - 1   Chief Complaint  Patient presents with  . DVT        Assessment & Plan    1. DVT, bilateral lower limbs- ultrasound done at PCP office, she is on chronic seizure medications including Tegretol which is an interaction with xaralto, hence she'll be started on Lovenox and Coumadin overlap, nursing staff instructed to start teaching. No chest symptoms.    2. Previous history of stroke, chronic daily seizures, left sided hemiparesis which is chronic. Continue supportive care, she is on the mitral and Tegretol which will be continued, when necessary benzodiazepine added for breakthrough seizures, will check Tegretol level. Seizure and aspiration precautions.    3. 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 Highest swollen secondary to DVT, she is unable to wear prosthetic limb for now. We will await the swelling subsides gradually before reattempting to apply prosthetic limb.    4. Hypothyroidism continue home dose Synthroid.      Code Status: Full  Family Communication: Mother bedside  Disposition Plan: Home   Procedures venous duplex lower extremity   Consults      DVT Prophylaxis  Lovenox - Coumadin  Lab Results  Component Value Date   PLT 208 11/07/2012    Medications  Scheduled Meds: . carbamazepine  300 mg Oral BID  . FLUoxetine  40 mg Oral Daily  . lamoTRIgine  300 mg Oral BID  . levothyroxine  25 mcg Oral QAC breakfast  . LORazepam  1 mg Intravenous Once  . sodium chloride  3 mL Intravenous Q12H   Continuous Infusions:  PRN Meds:.acetaminophen, clonazePAM, promethazine  Antibiotics     Anti-infectives   None        Time Spent in minutes  35   Susa Raring K M.D on 11/07/2012 at 10:30 AM  Between 7am to 7pm - Pager - [phone]  After 7pm go to [email or web address] - password [email or web address]  And look for the night coverage person covering for me after hours  [facility] Office  [phone]    Subjective:   [name] today has, No headache, No chest pain, No abdominal pain - No Nausea, No new weakness tingling or numbness, No Cough - SOB   Objective:   Filed Vitals:   11/07/12 0315 11/07/12 0357 11/07/12 0643 11/07/12 0708  BP: 96/64 104/72 112/72 100/73  Pulse: 72 69 84   Temp:  98.1 F (36.7 C)    TempSrc:  Oral    Resp: 16 18    Height:      Weight:  84.641 kg (186 lb 9.6 oz)    SpO2: 98% 100% 100% 100%    Wt Readings from Last 3 Encounters:  11/07/12 84.641 kg (186 lb 9.6 oz)  10/31/12 72.122 kg (159 lb)  Intake/Output Summary (Last 24 hours) at 11/07/12 1030 Last data filed at 11/07/12 0631  Gross per 24 hour  Intake      0 ml  Output    150 ml  Net   -150 ml    Exam Awake Alert, Oriented X 3, No new F.N deficits, Normal affect, chronic left-sided hemiparesis [place].AT,PERRAL Supple Neck,No JVD, No cervical lymphadenopathy appriciated.  Symmetrical Chest wall movement, Good air movement bilaterally, CTAB RRR,No Gallops,Rubs or new Murmurs, No Parasternal Heave +ve B.Sounds, Abd Soft, Non tender, No organomegaly appriciated, No rebound - guarding or rigidity. No Cyanosis, Clubbing or edema, No new Rash or bruise, right AKA, right thigh swollen, left leg swollen with 3+ edema.   Data Review   Micro Results Recent Results (from the past 240 hour(s))  MRSA PCR SCREENING     Status: None   Collection Time    11/07/12  4:56 AM      Result Value Range Status   MRSA by PCR NEGATIVE  NEGATIVE Final   Comment:            The GeneXpert MRSA Assay (FDA     approved for NASAL specimens     only), is one component of a     comprehensive MRSA colonization      surveillance program. It is not     intended to diagnose MRSA     infection nor to guide or     monitor treatment for     MRSA infections.    Radiology Reports Dg Femur Right  11/04/2012   CLINICAL DATA:  Fall, history of osteosarcoma, below-the-knee amputation, pain and swelling in right femur.  EXAM: RIGHT FEMUR - 2 VIEW  COMPARISON:  12/25/2009  FINDINGS: Four views of the right femur submitted. The hip joint is located. No acute fracture or subluxation. The patient is status post amputation of right foot, right knee and an distal right femur. The remaining 2 /3rd proximal femur shows no acute fracture or subluxation. No definite bone destruction to suggest osteomyelitis. No radiopaque foreign bodies noted within soft tissue. Partially visualized prior surgical changes mid femoral shaft  IMPRESSION: The patient is status post amputation of right foot, right knee and distal right femur. The remaining 2 /3rd proximal femur shows no acute fracture or subluxation. No definite bone destruction to suggest osteomyelitis. No radiopaque foreign bodies noted within soft tissue. Partially visualized prior surgical changes mid femoral shaft   Electronically Signed   By: Natasha Mead   On: 11/04/2012 14:00    CBC  Recent Labs Lab 11/04/12 1227 11/06/12 2230 11/07/12 0850  WBC 4.7 4.7 4.8  HGB 10.0* 10.3* 9.4*  HCT 32.3* 32.0* 29.9*  PLT 223 220 208  MCV 90.0 89.4 89.8  MCH 27.9 28.8 28.2  MCHC 31.0 32.2 31.4  RDW 13.7 13.7 13.8    Chemistries   Recent Labs Lab 11/04/12 1227 11/06/12 2230 11/07/12 0850  NA 138 141 139  K 3.5 4.1 3.7  CL 97 100 99  CO2 29 31 27   GLUCOSE 82 96 108*  BUN 7 11 9   CREATININE 0.78 0.89 0.71  CALCIUM 9.2 10.3 9.1   ------------------------------------------------------------------------------------------------------------------ estimated creatinine clearance is 98.2 ml/min (by C-G formula based on Cr of  0.71). ------------------------------------------------------------------------------------------------------------------ No results found for this basename: HGBA1C,  in the last 72 hours ------------------------------------------------------------------------------------------------------------------ No results found for this basename: CHOL, HDL, LDLCALC, TRIG, CHOLHDL, LDLDIRECT,  in the last 72 hours ------------------------------------------------------------------------------------------------------------------ No results found for this  basename: TSH, T4TOTAL, FREET3, T3FREE, THYROIDAB,  in the last 72 hours ------------------------------------------------------------------------------------------------------------------ No results found for this basename: VITAMINB12, FOLATE, FERRITIN, TIBC, IRON, RETICCTPCT,  in the last 72 hours  Coagulation profile  Recent Labs Lab 11/06/12 2230  INR 1.13    No results found for this basename: DDIMER,  in the last 72 hours  Cardiac Enzymes No results found for this basename: CK, CKMB, TROPONINI, MYOGLOBIN,  in the last 168 hours ------------------------------------------------------------------------------------------------------------------ No components found with this basename: POCBNP,

## 2012-11-07 NOTE — H&P (Signed)
[facility] History and Physical  [name] UJW:[id number] DOB: [birth year] DOA: 11/06/2012  Referring physician: ED PCP: Joneen Roach, MD   Chief Complaint: Leg Swelling, BLE DVT  HPI: Cassandra Andrade is a 47 y.o. female who presents from her PCPs office with ultrasound findings of extensive BLE DVTs.  The patient has BLE swelling and pain, symptoms onset 2 weeks ago, swelling has been really bad for the past 1 week according to her family.  She went to see her PCP 2 days ago, who ordered doppler US.  Doppler revealed BLE DVTs, PCP apparently did not feel comfortable treating this as an outpatient so referred the patient to the ED for admission to hospital.  Review of Systems: 12 systems reviewed and otherwise negative.  Past Medical History  Diagnosis Date  . Anxiety   . Depression   . Seizures   . CVA (cerebrovascular accident due to intracerebral hemorrhage) 2004  . Arthritis   . MRSA (methicillin resistant Staphylococcus aureus)   . S/P BKA (below knee amputation) unilateral     Right   . Thyroid disease   . Cancer     Osteosarcoma, right leg   Past Surgical History  Procedure Laterality Date  . Fracture surgery      Rt. femur, folllowed by MRSA  . Brain surgery  2004    cerebral aneurysm  . Total knee arthroplasty Right   . Above knee leg amputation Right     Osteosarcoma   Social History:  reports that she has quit smoking. She has never used smokeless tobacco. She reports that she does not drink alcohol or use illicit drugs.   Allergies  Allergen Reactions  . Vicodin [Hydrocodone-Acetaminophen] Nausea And Vomiting  . Morphine And Related Rash    Allergic reaction only in iv form    Family History  Problem Relation Age of Onset  . Anxiety disorder Mother   . Diabetes Father     Prior to Admission medications   Medication Sig Start Date End Date Taking? Authorizing Provider  acetaminophen (TYLENOL) 500 MG tablet Take 1,000 mg by mouth every 6  (six) hours as needed for pain.   Yes Historical Provider, MD  Aspirin-Salicylamide-Caffeine (BC HEADACHE POWDER PO) Take 1 packet by mouth daily as needed (pain , headaches).    Yes Historical Provider, MD  Calcium Carbonate-Vitamin D (CALCIUM + D PO) Take 1 tablet by mouth daily.   Yes Historical Provider, MD  carbamazepine (TEGRETOL) 200 MG tablet Take 300 mg by mouth 2 (two) times daily.   Yes Historical Provider, MD  clonazePAM (KLONOPIN) 1 MG disintegrating tablet Take 1 mg by mouth 3 (three) times daily as needed. For seizure.  PCP has suggested she might take 1/2 tab. PRN for the "butterflies".    Yes Historical Provider, MD  FLUoxetine (PROZAC) 40 MG capsule Take 1 capsule daily 10/31/12  Yes Cleotis Nipper, MD  lamoTRIgine (LAMICTAL) 200 MG tablet Take 300 mg by mouth 2 (two) times daily.     Yes Historical Provider, MD  levothyroxine (SYNTHROID, LEVOTHROID) 25 MCG tablet Take 25 mcg by mouth daily.   Yes Historical Provider, MD  promethazine (PHENERGAN) 25 MG tablet Take 25 mg by mouth every 6 (six) hours as needed. nausea   Yes Historical Provider, MD  trolamine salicylate (ASPERCREME) 10 % cream Apply 1 application topically as needed (pain).   Yes Historical Provider, MD  clindamycin (CLEOCIN) 150 MG capsule Take 2 capsules (300 mg total) by mouth 3 (three)  times daily. 11/04/12   Arthor Captain, PA-C   Physical Exam: Filed Vitals:   11/07/12 0315  BP: 96/64  Pulse: 72  Temp:   Resp: 16    General:  NAD, resting comfortably in bed Eyes: PEERLA EOMI ENT: mucous membranes moist Neck: supple w/o JVD Cardiovascular: RRR w/o MRG Respiratory: CTA B Abdomen: soft, nt, nd, bs+ Skin: BLE swelling, S/P RLE AKA,  Musculoskeletal: MAE, full ROM all 4 extremities Psychiatric: normal tone and affect Neurologic: AAOx3, grossly non-focal  Labs on Admission:  Basic Metabolic Panel:  Recent Labs Lab 11/04/12 1227 11/06/12 2230  NA 138 141  K 3.5 4.1  CL 97 100  CO2 29 31  GLUCOSE  82 96  BUN 7 11  CREATININE 0.78 0.89  CALCIUM 9.2 10.3   Liver Function Tests: No results found for this basename: AST, ALT, ALKPHOS, BILITOT, PROT, ALBUMIN,  in the last 168 hours No results found for this basename: LIPASE, AMYLASE,  in the last 168 hours No results found for this basename: AMMONIA,  in the last 168 hours CBC:  Recent Labs Lab 11/04/12 1227 11/06/12 2230  WBC 4.7 4.7  HGB 10.0* 10.3*  HCT 32.3* 32.0*  MCV 90.0 89.4  PLT 223 220   Cardiac Enzymes: No results found for this basename: CKTOTAL, CKMB, CKMBINDEX, TROPONINI,  in the last 168 hours  BNP (last 3 results)  Recent Labs  11/04/12 1227  PROBNP 74.6   CBG: No results found for this basename: GLUCAP,  in the last 168 hours  Radiological Exams on Admission: No results found.  EKG: Independently reviewed.  Assessment/Plan Principal Problem:   DVT, bilateral lower limbs   1. BLE DVT - no symptoms at this point to suggest massive or submassive PE, severe BLE edema and swelling but clinical history of 1 week history of swelling and presentation is not consistent with phlegmasia cerula dolens at this time.  Patient is resting comfortably in bed and in NAD at this time.  Family does not feel comfortable taking patient home at this time, so will admit.  IV team unable to get IV access so will order therapeutic dose lovenox pharm to dose, case worker consulted to determine PO treatment options.    Code Status: Full Code (must indicate code status--if unknown or must be presumed, indicate so) Family Communication: Spoke with family at bedside (indicate person spoken with, if applicable, with phone number if by telephone) Disposition Plan: Admit to obs (indicate anticipated LOS)  Time spent: 70 min  Victorio Creeden M. [facility] Pager [phone]  If 7PM-7AM, please contact night-coverage [email or web address] Password [facility] 11/07/2012, 3:34 AM

## 2012-11-07 NOTE — ED Provider Notes (Signed)
Patient discharged accidentally by previous provider   Arman Filter, NP 11/07/12 [phone]

## 2012-11-07 NOTE — ED Notes (Signed)
Pt transported to room C29

## 2012-11-07 NOTE — Care Management Note (Signed)
    Page 1 of 1   11/07/2012     3:45:41 PM   CARE MANAGEMENT NOTE 11/07/2012  Patient:  Cassandra Andrade,Cassandra Andrade   Account Number:  [id number]  Date Initiated:  11/07/2012  Documentation initiated by:  Besan Ketchem  Subjective/Objective Assessment:   PT ADM ON 11/06/12 WITH MASSIVE DVT.  PTA, PT LIVES WITH AND IS CARED FOR BY MOTHER.     Action/Plan:   CM REFERRAL FOR LOVENOX COVERAGE.   Anticipated DC Date:  11/07/2012   Anticipated DC Plan:  HOME/SELF CARE      DC Planning Services  CM consult  Medication Assistance      Choice offered to / List presented to:             Status of service:  Completed, signed off Medicare Important Message given?   (If response is "NO", the following Medicare IM given date fields will be blank) Date Medicare IM given:   Date Additional Medicare IM given:    Discharge Disposition:  HOME/SELF CARE  Per UR Regulation:  Reviewed for med. necessity/level of care/duration of stay  If discussed at Long Length of Stay Meetings, dates discussed:    Comments:  11/07/12 Cassandra Vanburen,RN,BSN [phone] PT TO DC ON LOVENOX FOR DVT TREATMENT.  PER [facility] ON [address], PT'S COPAY IS $0 FOR 14 100MG  SYRINGES (DOSE IS 85MG ).  NOTIFIED PT, MOM AND BEDSIDE RN.  PT WILL DC HOME TODAY.  MOM TO ASSIST WITH INJECTIONS.

## 2012-11-07 NOTE — ED Notes (Signed)
Report given to floor

## 2012-11-07 NOTE — Progress Notes (Signed)
Pt has seizure for about 10 minutes,V/S stable,Dr.J Julian Reil made aware with orders to give 1 mg IV ativan,PO PRN clonazepam was change to disintegrating clonazepam.Will continue to monitor. Cassandra Andrade Jamey Reas RN

## 2012-11-07 NOTE — ED Notes (Signed)
IV team advised unable to perform ultrasound guided IV insertion

## 2012-11-12 LAB — CARBAMAZEPINE, FREE AND TOTAL
Carbamazepine Metabolite -: 4 ug/mL — ABNORMAL HIGH (ref 0.2–2.0)
Carbamazepine Metabolite: 2 ug/mL — ABNORMAL HIGH (ref 0.1–1.0)
Carbamazepine, Bound: 6.1 ug/mL
Carbamazepine, Free: 1.6 ug/mL (ref 1.0–3.0)
Carbamazepine, Total: 7.7 ug/mL (ref 4.0–12.0)

## 2012-11-16 ENCOUNTER — Encounter (HOSPITAL_COMMUNITY): Payer: Self-pay | Admitting: Licensed Clinical Social Worker

## 2012-11-16 ENCOUNTER — Ambulatory Visit (HOSPITAL_COMMUNITY): Payer: Self-pay | Admitting: Licensed Clinical Social Worker

## 2012-11-16 NOTE — Progress Notes (Signed)
Patient ID: Cassandra Andrade, female   DOB: December 02, 1965, 47 y.o.   MRN: [id number] Patient cancelled late for today's appointment.

## 2012-11-19 ENCOUNTER — Other Ambulatory Visit: Payer: Self-pay | Admitting: Neurology

## 2012-12-01 ENCOUNTER — Ambulatory Visit: Payer: Medicare Other

## 2012-12-01 ENCOUNTER — Ambulatory Visit (HOSPITAL_BASED_OUTPATIENT_CLINIC_OR_DEPARTMENT_OTHER): Payer: Medicare Other | Admitting: Lab

## 2012-12-01 ENCOUNTER — Ambulatory Visit (HOSPITAL_BASED_OUTPATIENT_CLINIC_OR_DEPARTMENT_OTHER): Payer: Medicare Other | Admitting: Hematology & Oncology

## 2012-12-01 VITALS — BP 95/57 | HR 79 | Temp 98.6°F | Resp 14 | Ht 67.0 in | Wt 185.0 lb

## 2012-12-01 DIAGNOSIS — Z8679 Personal history of other diseases of the circulatory system: Secondary | ICD-10-CM

## 2012-12-01 DIAGNOSIS — I82409 Acute embolism and thrombosis of unspecified deep veins of unspecified lower extremity: Secondary | ICD-10-CM

## 2012-12-01 DIAGNOSIS — I82403 Acute embolism and thrombosis of unspecified deep veins of lower extremity, bilateral: Secondary | ICD-10-CM

## 2012-12-01 DIAGNOSIS — D649 Anemia, unspecified: Secondary | ICD-10-CM

## 2012-12-01 DIAGNOSIS — C419 Malignant neoplasm of bone and articular cartilage, unspecified: Secondary | ICD-10-CM

## 2012-12-01 LAB — CBC WITH DIFFERENTIAL (CANCER CENTER ONLY)
BASO#: 0 10*3/uL (ref 0.0–0.2)
BASO%: 0.3 % (ref 0.0–2.0)
EOS%: 1.3 % (ref 0.0–7.0)
HCT: 28.6 % — ABNORMAL LOW (ref 34.8–46.6)
LYMPH%: 44.4 % (ref 14.0–48.0)
MCHC: 30.1 g/dL — ABNORMAL LOW (ref 32.0–36.0)
MCV: 91 fL (ref 81–101)
MONO#: 0.3 10*3/uL (ref 0.1–0.9)
NEUT%: 43.1 % (ref 39.6–80.0)
RDW: 14.3 % (ref 11.1–15.7)

## 2012-12-01 NOTE — Progress Notes (Signed)
This office note has been dictated.

## 2012-12-04 LAB — HYPERCOAGULABLE PANEL, COMPREHENSIVE
Anticardiolipin IgA: 0 APL U/mL (ref <22)
Anticardiolipin IgG: 12 GPL U/mL (ref <23)
DRVVT: 60.9 secs — ABNORMAL HIGH (ref <42.9)
Lupus Anticoagulant: DETECTED — AB
PTTLA 4:1 Mix: 54.7 secs — ABNORMAL HIGH (ref 28.0–43.0)
PTTLA Confirmation: 19 secs — ABNORMAL HIGH (ref <8.0)
Protein C Activity: 48 % — ABNORMAL LOW (ref 75–133)
Protein S Total: 52 % — ABNORMAL LOW (ref 60–150)

## 2012-12-04 NOTE — Progress Notes (Signed)
CC:   Duayne Cal, NP, Fax [phone]  DIAGNOSES: 1. Bilateral lower extremity deep venous thrombosis. 2. 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 3. Multiple miscarriages.  HISTORY OF PRESENT ILLNESS:  Ms. Bensen is a very charming 47 year old African American female.  She has an incredibly complicated past history.  She was diagnosed with an osteosarcoma of the right lower leg back in her early 54s.  I think this was in 1990.  She had radiation and chemotherapy.  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  She apparently had subsequent issues with this.  She was in a coma for quite a while.  She had a tracheostomy placed.  She was subsequently placed in a long-term care facility.  She has had multiple miscarriages.  She does have 3 children.  She says that her 3 children all had normal births.  She does have heavy menstrual cycles.  She began to develop some edema in her lower extremities.  She did undergo a right BKA back, I think, a couple years ago.  Apparently, she got this right lower leg infected.  She had the swelling.  She had some Dopplers done.  This was done at [facility].  Unfortunately, I do not have any records from this. However, it is reported that she had bilateral thromboembolic events.  She was subsequently placed on Lovenox and then Coumadin.  She has come to the [facility] to be evaluated.  Patient comes with her mom.  Mom is very helpful in providing a lot of information.  Ms. Mohiuddin is in a wheelchair.  She does have some weakness over on her left side.  She has had no obvious bleeding from the Coumadin.  This DVT was found about 1 month ago.  She has had no seizures.  She has had no cough or shortness of breath. There is no chest wall pain.  There is no nausea or vomiting.  There is no back pain.  Overall,  her performance status is ECOG 2-3.  Her appetite is okay.  She is not a vegetarian.  PAST MEDICAL HISTORY: 1. Remarkable for the osteosarcoma of the right lower leg. 2. Right AKA in 2013. 3. AVM with subsequent CVA. 4. DVT. 5. hypothyroidism. 6. Situational depression. 7. Iron-deficiency anemia.  ALLERGIES: 1. Morphine. 2. Vicodin.  MEDICATIONS: 1. Lamictal 200 mg p.o. b.i.d. 2. Prozac 40 mg p.o. daily. 3. Klonopin 1 mg p.o. t.i.d. p.r.n. 4. Tegretol 300 mg p.o. b.i.d. 5. Coumadin 5 mg p.o. daily. 6. Phenergan 12 mg p.o. q.6 hours p.r.n.  SOCIAL HISTORY:  Negative for tobacco use.  There is no alcohol use. She has no obvious occupational exposures.  She has no recreational drug use.  FAMILY HISTORY:  Negative for any history of thromboembolic disease. There is no history of malignancy in the family.  REVIEW OF SYSTEMS:  As stated in history of present illness.  No additional findings are noted on a 12-system review.  PHYSICAL EXAMINATION:  General:  This is a slightly chronically fatigued, appearing African American female in no obvious distress. Vital signs:  Temperature of 98.6, pulse 79, respiratory rate 16, blood pressure 95/57.  Weight is 185 pounds.  Head and neck:  Exam shows a normocephalic, atraumatic skull.  There are no ocular or oral lesions. There are no palpable cervical or supraclavicular lymph nodes.  Lungs are clear  to percussion and auscultation bilaterally.  Cardiac exam: Regular rate and rhythm with a normal S1 and S2.  There are no murmurs, rubs or bruits.  Abdomen:  Soft.  She has good bowel sounds.  There is no palpable abdominal mass.  There is no palpable hepatosplenomegaly. Back exam:  No tenderness over the spine, ribs, or hips.  Extremities: Shows the right above-knee amputation.  Left  leg does show some chronic pitting edema.  I really cannot palpate any obvious venous cord in the legs.  Neurological: Exam shows some weakness along the  left side.  LABORATORY STUDIES:  White cell count is 3, hemoglobin 8.6, hematocrit 28.6, platelet count 251.  MCV is 91.  Peripheral smear shows a mild anisocytosis and poikilocytosis.  There is no nucleated red blood cells.  There are no teardrop cells.  I see no schistocytes or spherocytes.  There are no target cells.  White cells appear normal in morphology and maturation.  There might be a slight decrease in white cells.  There are no immature myeloid or lymphoid forms.  Platelets are adequate in number and size.  IMPRESSION:  Ms. Donlan is a very charming 47 year old African female. 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 She has had multiple miscarriages.  She had the stroke, but this appeared to be from an AVM.  She now has the thromboembolic disease in her legs.  Apparently, it would really be nice to try to get hold of the Doppler that was done to see exactly what the thrombus was.  I think that Coumadin would be the safest bet for her.  Given that she has these AVMs, I would think it would be tough to have her on one of the new oral anticoagulants and if she bled, this can be catastrophic. At least with Coumadin, this could be reversed pretty quickly.  We will see what her hypercoagulable panel shows.  Apparently, she is anemic.  I just suspect that she is iron deficient. I did give her some samples of oral iron.  I do think a CT scan is indicated.  I think given that she has this history of osteosarcoma, I suppose that she is at risk for this to recur.  Granted, it has been 20 years.  However, it is not totally unheard of to have recurrence so far out from therapy.  I believe that a CT of the chest, abdomen and pelvis would be helpful.  I spent about an hour and 20 minutes with Ms. Sirois and her mom.  I explained my recommendations to them.  I think that she probably will need Coumadin for a year.  I would  hate to commit her to lifelong Coumadin.  She has not had any prior thromboembolic event.  Even if she does have a hypercoagulable state, I do not think she needs lifelong anticoagulation.  Ms. Rinn is very nice.  She has had a very tough life so far, but she has a good, strong constitution.  I will plan to see her back in another 4-6 weeks.  I do not think we need another Doppler test, probably for 3 months.    ______________________________ Josph Macho, M.D. PRE/MEDQ  D:  12/01/2012  T:  12/01/2012  Job:  [id number]

## 2012-12-07 ENCOUNTER — Telehealth (HOSPITAL_COMMUNITY): Payer: Self-pay | Admitting: Psychiatry

## 2012-12-07 ENCOUNTER — Telehealth (HOSPITAL_COMMUNITY): Payer: Self-pay

## 2012-12-07 NOTE — Telephone Encounter (Signed)
12/07/12 9:13am Pt's mother called stating that the patient is in [facility] - cell#[phone].Marland KitchenMarguerite Olea

## 2012-12-07 NOTE — Telephone Encounter (Signed)
Call return and spoke to her mother. She is admitted at [facility] for blood clot in her leg. She will be discharge soon. Rec to call us back if she has any questions.

## 2012-12-09 LAB — HEXAGONAL PHOSPHOLIPID NEUTRALIZATION: Hex Phosph Neut Test: POSITIVE — AB

## 2012-12-12 ENCOUNTER — Telehealth: Payer: Self-pay | Admitting: Hematology & Oncology

## 2012-12-12 NOTE — Telephone Encounter (Signed)
Referring called said they have not received MD note. I faxed it to them.

## 2012-12-13 ENCOUNTER — Telehealth: Payer: Self-pay | Admitting: Neurology

## 2012-12-13 NOTE — Telephone Encounter (Signed)
This patient was seen at [facility] for video EEG monitoring. The patient had several seizures without EEG correlate, but several [seizures were recorded without clinical correlate. 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 The patient is being placed on Vimpat going up to 200 mg twice daily. Lamictal was decreased to 200 mg twice daily, and the carbamazepine is to be withdrawn.

## 2012-12-24 ENCOUNTER — Other Ambulatory Visit (HOSPITAL_COMMUNITY): Payer: Self-pay | Admitting: Psychiatry

## 2013-01-01 ENCOUNTER — Encounter (HOSPITAL_COMMUNITY): Payer: Self-pay | Admitting: Psychiatry

## 2013-01-01 ENCOUNTER — Ambulatory Visit (INDEPENDENT_AMBULATORY_CARE_PROVIDER_SITE_OTHER): Payer: Medicare Other | Admitting: Psychiatry

## 2013-01-01 VITALS — BP 123/79 | HR 85

## 2013-01-01 DIAGNOSIS — F329 Major depressive disorder, single episode, unspecified: Secondary | ICD-10-CM

## 2013-01-01 MED ORDER — FLUOXETINE HCL 40 MG PO CAPS: ORAL_CAPSULE | ORAL | Status: DC
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
## 2013-01-05 ENCOUNTER — Ambulatory Visit: Payer: Medicare Other | Admitting: Nurse Practitioner

## 2013-01-08 ENCOUNTER — Encounter: Payer: Self-pay | Admitting: Nurse Practitioner

## 2013-01-08 ENCOUNTER — Ambulatory Visit (INDEPENDENT_AMBULATORY_CARE_PROVIDER_SITE_OTHER): Payer: Medicare Other | Admitting: Nurse Practitioner

## 2013-01-08 ENCOUNTER — Other Ambulatory Visit: Payer: Self-pay | Admitting: Nurse Practitioner

## 2013-01-08 VITALS — BP 115/80 | HR 85

## 2013-01-08 DIAGNOSIS — G40909 Epilepsy, unspecified, not intractable, without status epilepticus: Secondary | ICD-10-CM

## 2013-01-08 DIAGNOSIS — S88119A Complete traumatic amputation at level between knee and ankle, unspecified lower leg, initial encounter: Secondary | ICD-10-CM

## 2013-01-08 DIAGNOSIS — Z89511 Acquired absence of right leg below knee: Secondary | ICD-10-CM

## 2013-01-08 MED ORDER — LACOSAMIDE 200 MG PO TABS: 200.0000 mg | ORAL_TABLET | Freq: Two times a day (BID) | ORAL | Status: DC

## 2013-01-08 NOTE — Telephone Encounter (Signed)
Pt's prescription was faxed over to [facility] at [phone]. [phone]

## 2013-01-08 NOTE — Progress Notes (Signed)
I have read the note, and I agree with the clinical assessment and plan.  Ferris Tally KEITH   

## 2013-01-08 NOTE — Patient Instructions (Signed)
Continue Lamictal 200 mg twice daily Continued Vimpat 200 mg twice daily, will refill Followup in 6 months

## 2013-01-08 NOTE — Progress Notes (Signed)
[facility]  PATIENT: Cassandra Andrade DOB: 1965-11-11   REASON FOR VISIT: Followup for seizure disorder   HISTORY OF PRESENT ILLNESS: Ms. Scardino, 47 year old black female returns for followup. She was last seen by Dr. Anne Hahn 10/24/2012. She went to  [facility] for video EEG monitoring since last seen . The patient had several seizures without EEG correlate, but several were recorded without clinical correlate. 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 She was placed on Vimpat 200 twice daily, remains on Lamictal 200 twice daily and her carbamazepine has been discontinued. Mother says that her events are much less and her most recent event in the last several weeks was due to stress and she was able to talk her through it. She continues to be followed through psychiatry.  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 The patient has developed intractable seizures since that time, currently on carbamazepine and Lamictal and clonazepam. The patient is felt to have daily seizure events, but the patient apparently has episodes associated with "negative thoughts". The patient will come get her mother to take medications during these events. The patient has not had any actual events of loss of consciousness or generalized jerking. The patient may have episodes of "staring off". The patient has significant psychiatric issues, and she is followed through psychiatry. The patient was set up for a video EEG monitoring study last year, but the mother was unable to accompany the patient for an extended period of time.     REVIEW OF SYSTEMS: Full 14 system review of systems performed and notable only for:  Constitutional: N/A  Cardiovascular: N/A  Ear/Nose/Throat: N/A  Skin: N/A  Eyes: N/A  Respiratory: N/A  Gastroitestinal: N/A  Hematology/Lymphatic: N/A  Endocrine: Feeling cold    Musculoskeletal:N/A  Allergy/Immunology: N/A  Neurological: Seizure Psychiatric: Depression anxiety  ALLERGIES: Allergies  Allergen Reactions  . Vicodin [Hydrocodone-Acetaminophen] Nausea And Vomiting  . Morphine And Related Rash    Allergic reaction only in iv form    HOME MEDICATIONS: Outpatient Prescriptions Prior to Visit  Medication Sig Dispense Refill  . Ascorbic Acid (VITAMIN C) 1000 MG tablet TAKE 1 TABLET BY MOUTH EVERY DAY      . Calcium Carbonate-Vitamin D 600-400 MG-UNIT per tablet 1 tablet.      . Iron-FA-B Cmp-C-Biot-Probiotic (FUSION PLUS) CAPS 1 capsule.      . Lacosamide 100 MG TABS Increase as instructed up to 2 tablets (200 mg) by mouth 2 times daily.      [name] lamoTRIgine (LAMICTAL) 200 MG tablet 200 mg.      . levothyroxine (SYNTHROID, LEVOTHROID) 25 MCG tablet Take 25 mcg by mouth daily.      . promethazine (PHENERGAN) 25 MG tablet Take 25 mg by mouth every 6 (six) hours as needed. nausea      . warfarin (COUMADIN) 5 MG tablet Take 1 tablet (5 mg total) by mouth daily. Please get her INR checked in 2 days by primary care physician and get Lovenox and Coumadin dose adjusted  10 tablet  0  . FLUoxetine (PROZAC) 40 MG capsule Take 1 capsule daily  30 capsule  2  . enoxaparin (LOVENOX) 100 MG/ML injection 90 mg.      . trolamine salicylate (ASPERCREME) 10 % cream Apply 1 application topically as needed (pain).       No facility-administered medications prior to visit.    PAST MEDICAL HISTORY:  Past Medical History  Diagnosis Date  . Anxiety   . Depression   . Seizures   . CVA (cerebrovascular accident due to intracerebral hemorrhage) 2004  . Arthritis   . MRSA (methicillin resistant Staphylococcus aureus)   . S/P BKA (below knee amputation) unilateral     Right   . Thyroid disease   . Cancer     Osteosarcoma, right leg    PAST SURGICAL HISTORY: Past Surgical History  Procedure Laterality Date  . Fracture surgery      Rt. femur, folllowed by MRSA  .  Brain surgery  2004    cerebral aneurysm  . Total knee arthroplasty Right   . Above knee leg amputation Right     Osteosarcoma    FAMILY HISTORY: Family History  Problem Relation Age of Onset  . Anxiety disorder Mother   . Diabetes Father     SOCIAL HISTORY: History   Social History  . Marital Status: Married    Spouse Name: N/A    Number of Children: 3  . Years of Education: GED   Occupational History  . Not on file.   Social History Main Topics  . Smoking status: Former Games developer  . Smokeless tobacco: Never Used  . Alcohol Use: No  . Drug Use: No  . Sexual Activity: Not Currently   Other Topics Concern  . Not on file   Social History Narrative   Patient lives at home with mom.    Patient has GED.    Patient has 3 children.    Patient is married but in the process of divorced.            PHYSICAL EXAM  Filed Vitals:   01/08/13 0851  BP: 115/80  Pulse: 85   Cannot calculate BMI with a height equal to zero.  Generalized: Well developed, in no acute distress  Musculoskeletal: Right above-knee amputation Neurological examination   Mentation: Alert oriented to time, place, history taking. Follows all commands speech and language fluent  Cranial nerve II-XII: Pupils were equal round reactive to light extraocular movements were full, visual field with left dense homonymous visual field cut.  Facial sensation and strength were normal. hearing was intact to finger rubbing bilaterally. Uvula tongue midline. head turning and shoulder shrug and were normal and symmetric.Tongue protrusion into cheek strength was normal. Motor: normal bulk and tone, full strength in the BUE, BLE, right above-knee amputation  No focal weakness  Coordination: finger-nose-finger,  no dysmetria Reflexes: Depressed upper and lower but symmetric Gait and Station: Wheelchair-bound not ambulated   DIAGNOSTIC DATA (LABS, IMAGING, TESTING) - I reviewed patient records, labs, notes, testing  and imaging myself where available.  Lab Results  Component Value Date   WBC 3.0* 12/01/2012   HGB 8.6* 12/01/2012   HCT 28.6* 12/01/2012   MCV 91 12/01/2012   PLT 251 12/01/2012      Component Value Date/Time   NA 139 11/07/2012 0850   NA 139 10/24/2012 1031   K 3.7 11/07/2012 0850   CL 99 11/07/2012 0850   CO2 27 11/07/2012 0850   GLUCOSE 108* 11/07/2012 0850   GLUCOSE 75 10/24/2012 1031   BUN 9 11/07/2012 0850   BUN 11 10/24/2012 1031   CREATININE 0.71 11/07/2012 0850   CALCIUM 9.1 11/07/2012 0850   CALCIUM 8.4 12/26/2009 1025   PROT 6.9 10/24/2012 1031   PROT 7.0 06/07/2011 0135   ALBUMIN 3.5 06/07/2011 0135   AST 17 10/24/2012 1031   ALT 13  10/24/2012 1031   ALKPHOS 110 10/24/2012 1031   BILITOT 0.2 10/24/2012 1031   GFRNONAA >90 11/07/2012 0850   GFRAA >90 11/07/2012 0850    ASSESSMENT AND PLAN  47 y.o. year old female  has a past medical history of Anxiety; Depression; Seizures; CVA (cerebrovascular accident due to intracerebral hemorrhage) (2004); Arthritis; MRSA (methicillin resistant Staphylococcus aureus); S/P BKA (below knee amputation) right ,Cancer. here to followup with seizure disorder.She went to  [facility] for video EEG monitoring since last seen . The patient had several seizures without EEG correlate, but several were recorded without clinical correlate. 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 She was placed on Vimpat 200 twice daily, remains on Lamictal 200 twice daily   Continue Lamictal 200 mg twice dailydoes not need refills Continued Vimpat 200 mg twice daily, will refill Followup in 6 months Nilda Riggs, [facility], [facility], APRN  [facility] [address] [place], [place] [address] [phone]

## 2013-01-10 ENCOUNTER — Ambulatory Visit (INDEPENDENT_AMBULATORY_CARE_PROVIDER_SITE_OTHER): Payer: Medicare Other | Admitting: Licensed Clinical Social Worker

## 2013-01-10 DIAGNOSIS — F329 Major depressive disorder, single episode, unspecified: Secondary | ICD-10-CM

## 2013-01-10 NOTE — Progress Notes (Signed)
   THERAPIST PROGRESS NOTE  Session Time: 3:00pm-3:50pm  Participation Level: Active  Behavioral Response: Well GroomedAlertAnxious  Type of Therapy: Individual Therapy  Treatment Goals addressed: Coping  Interventions: Motivational Interviewing, Strength-based and Supportive  Summary: Cassandra Andrade is a 47 y.o. female who presents with euthymic mood and bright affect. She reports significant improvement in her depression. She was hospitalized for a week for seizure evaluation. She continues to experience regular seizures but has declined surgery. She does not want a second brain surgery. Her and her mother are getting along much better. Patient' sleep has improved and her appetite is wnl. She process her loss over her limb and her concern that she is causing her emotional stress and pain. She often feels guilty that she needs as much help as she does. She plans to start taking a college class online.   Suicidal/Homicidal: Nowithout intent/plan  Therapist Response: Assessed patients current functioning and reviewed progress. Reviewed coping strategies. Assessed patients safety and assisted in identifying protective factors.  Reviewed crisis plan with patient. Assisted patient with the expression of frustration with her limited mobility and anxiety over how this affects her mother. Reviewed patients self care plan. Assessed progress related to self care. Patients self care is good. Recommend daily exercise, increased socialization and recreation. Used motivational interviewing to assist and encourage patient through the change process. Explored patients barriers to change. Processed and normalized patients grief reaction.   Plan: Return again in six weeks.  Diagnosis: Axis I: Depressive Disorder NOS    Axis II: No diagnosis    Avry Roedl, LCSW 01/10/2013

## 2013-02-17 ENCOUNTER — Emergency Department (HOSPITAL_COMMUNITY): Payer: Medicare Other

## 2013-02-17 ENCOUNTER — Encounter (HOSPITAL_COMMUNITY): Payer: Self-pay | Admitting: Emergency Medicine

## 2013-02-17 ENCOUNTER — Inpatient Hospital Stay (HOSPITAL_COMMUNITY)
Admission: EM | Admit: 2013-02-17 | Discharge: 2013-02-18 | DRG: 101 | Disposition: A | Discharge status: Home / Self Care | Payer: Medicare Other | Attending: Internal Medicine | Admitting: Internal Medicine

## 2013-02-17 DIAGNOSIS — Z89519 Acquired absence of unspecified leg below knee: Secondary | ICD-10-CM

## 2013-02-17 DIAGNOSIS — Z87891 Personal history of nicotine dependence: Secondary | ICD-10-CM

## 2013-02-17 DIAGNOSIS — R569 Unspecified convulsions: Secondary | ICD-10-CM | POA: Diagnosis present

## 2013-02-17 DIAGNOSIS — I82403 Acute embolism and thrombosis of unspecified deep veins of lower extremity, bilateral: Secondary | ICD-10-CM | POA: Diagnosis present

## 2013-02-17 DIAGNOSIS — G894 Chronic pain syndrome: Secondary | ICD-10-CM

## 2013-02-17 DIAGNOSIS — I824Z9 Acute embolism and thrombosis of unspecified deep veins of unspecified distal lower extremity: Secondary | ICD-10-CM | POA: Diagnosis present

## 2013-02-17 DIAGNOSIS — F329 Major depressive disorder, single episode, unspecified: Secondary | ICD-10-CM

## 2013-02-17 DIAGNOSIS — G40909 Epilepsy, unspecified, not intractable, without status epilepticus: Secondary | ICD-10-CM

## 2013-02-17 DIAGNOSIS — E039 Hypothyroidism, unspecified: Secondary | ICD-10-CM | POA: Diagnosis present

## 2013-02-17 DIAGNOSIS — Z79899 Other long term (current) drug therapy: Secondary | ICD-10-CM

## 2013-02-17 DIAGNOSIS — Z8614 Personal history of Methicillin resistant Staphylococcus aureus infection: Secondary | ICD-10-CM

## 2013-02-17 DIAGNOSIS — Z7901 Long term (current) use of anticoagulants: Secondary | ICD-10-CM

## 2013-02-17 DIAGNOSIS — G40109 Localization-related (focal) (partial) symptomatic epilepsy and epileptic syndromes with simple partial seizures, not intractable, without status epilepticus: Principal | ICD-10-CM | POA: Diagnosis present

## 2013-02-17 DIAGNOSIS — I82409 Acute embolism and thrombosis of unspecified deep veins of unspecified lower extremity: Secondary | ICD-10-CM

## 2013-02-17 DIAGNOSIS — Z8673 Personal history of transient ischemic attack (TIA), and cerebral infarction without residual deficits: Secondary | ICD-10-CM

## 2013-02-17 DIAGNOSIS — S88119A Complete traumatic amputation at level between knee and ankle, unspecified lower leg, initial encounter: Secondary | ICD-10-CM

## 2013-02-17 DIAGNOSIS — Z89511 Acquired absence of right leg below knee: Secondary | ICD-10-CM

## 2013-02-17 LAB — LACTIC ACID, PLASMA: Lactic Acid, Venous: 0.5 mmol/L (ref 0.5–2.2)

## 2013-02-17 LAB — COMPREHENSIVE METABOLIC PANEL
ALT: 33 U/L (ref 0–35)
AST: 26 U/L (ref 0–37)
Albumin: 3.8 g/dL (ref 3.5–5.2)
Alkaline Phosphatase: 105 U/L (ref 39–117)
BUN: 15 mg/dL (ref 6–23)
Calcium: 9.2 mg/dL (ref 8.4–10.5)
Chloride: 100 mEq/L (ref 96–112)
Potassium: 4.1 mEq/L (ref 3.5–5.1)
Sodium: 138 mEq/L (ref 135–145)
Total Protein: 7.7 g/dL (ref 6.0–8.3)

## 2013-02-17 LAB — RAPID URINE DRUG SCREEN, HOSP PERFORMED
Amphetamines: NOT DETECTED
Barbiturates: NOT DETECTED
Benzodiazepines: NOT DETECTED

## 2013-02-17 LAB — PROTIME-INR: Prothrombin Time: 20.9 seconds — ABNORMAL HIGH (ref 11.6–15.2)

## 2013-02-17 LAB — CBC
HCT: 40.6 % (ref 36.0–46.0)
MCH: 28 pg (ref 26.0–34.0)
MCHC: 31.8 g/dL (ref 30.0–36.0)
Platelets: 187 10*3/uL (ref 150–400)
RDW: 14.9 % (ref 11.5–15.5)

## 2013-02-17 LAB — TYPE AND SCREEN: Antibody Screen: NEGATIVE

## 2013-02-17 LAB — TSH: TSH: 2.417 u[IU]/mL (ref 0.350–4.500)

## 2013-02-17 LAB — URINALYSIS, ROUTINE W REFLEX MICROSCOPIC
Bilirubin Urine: NEGATIVE
Glucose, UA: NEGATIVE mg/dL
Ketones, ur: NEGATIVE mg/dL
Nitrite: NEGATIVE
Urobilinogen, UA: 0.2 mg/dL (ref 0.0–1.0)
pH: 6 (ref 5.0–8.0)

## 2013-02-17 LAB — PROLACTIN: Prolactin: 16.8 ng/mL

## 2013-02-17 LAB — ETHANOL: Alcohol, Ethyl (B): <11 mg/dL (ref 0–11)

## 2013-02-17 LAB — URINE MICROSCOPIC-ADD ON

## 2013-02-17 MED ORDER — CALCIUM CARBONATE-VITAMIN D 500-200 MG-UNIT PO TABS
1.0000 | ORAL_TABLET | Freq: Every morning | ORAL | Status: DC
  Administered 2013-02-17 – 2013-02-18 (×2): 1 via ORAL
  Filled 2013-02-17 (×2): qty 1

## 2013-02-17 MED ORDER — LEVETIRACETAM 500 MG/5ML IV SOLN
1000.0000 mg | Freq: Once | INTRAVENOUS | Status: AC
  Administered 2013-02-17: 1000 mg via INTRAVENOUS
  Filled 2013-02-17: qty 10

## 2013-02-17 MED ORDER — LEVOTHYROXINE SODIUM 25 MCG PO TABS
25.0000 ug | ORAL_TABLET | Freq: Every day | ORAL | Status: DC
  Administered 2013-02-17 – 2013-02-18 (×2): 25 ug via ORAL
  Filled 2013-02-17 (×3): qty 1

## 2013-02-17 MED ORDER — SODIUM CHLORIDE 0.9 % IV SOLN
500.0000 mg | Freq: Two times a day (BID) | INTRAVENOUS | Status: DC
  Administered 2013-02-17 – 2013-02-18 (×3): 500 mg via INTRAVENOUS
  Filled 2013-02-17 (×3): qty 5

## 2013-02-17 MED ORDER — SODIUM CHLORIDE 0.9 % IJ SOLN: 3.0000 mL | Freq: Two times a day (BID) | INTRAMUSCULAR | Status: DC

## 2013-02-17 MED ORDER — FERROUS SULFATE 325 (65 FE) MG PO TABS
325.0000 mg | ORAL_TABLET | Freq: Every day | ORAL | Status: DC
  Administered 2013-02-18: 325 mg via ORAL
  Filled 2013-02-17 (×2): qty 1

## 2013-02-17 MED ORDER — WARFARIN SODIUM 7.5 MG PO TABS
7.5000 mg | ORAL_TABLET | ORAL | Status: DC
  Administered 2013-02-17: 7.5 mg via ORAL
  Filled 2013-02-17: qty 1

## 2013-02-17 MED ORDER — PROMETHAZINE HCL 25 MG PO TABS
25.0000 mg | ORAL_TABLET | Freq: Four times a day (QID) | ORAL | Status: DC | PRN
  Administered 2013-02-17: 25 mg via ORAL
  Filled 2013-02-17: qty 1

## 2013-02-17 MED ORDER — DEXTROSE-NACL 5-0.9 % IV SOLN
INTRAVENOUS | Status: DC
  Administered 2013-02-17: 17:00:00 via INTRAVENOUS

## 2013-02-17 MED ORDER — LORAZEPAM 2 MG/ML IJ SOLN
1.0000 mg | Freq: Once | INTRAMUSCULAR | Status: AC
  Administered 2013-02-17: 1 mg via INTRAVENOUS
  Filled 2013-02-17: qty 1

## 2013-02-17 MED ORDER — FLUOXETINE HCL 20 MG PO CAPS
60.0000 mg | ORAL_CAPSULE | Freq: Every day | ORAL | Status: DC
  Administered 2013-02-17 – 2013-02-18 (×2): 60 mg via ORAL
  Filled 2013-02-17 (×2): qty 3

## 2013-02-17 MED ORDER — WARFARIN SODIUM 10 MG PO TABS
10.0000 mg | ORAL_TABLET | ORAL | Status: DC
Start: 2013-02-18 — End: 2013-02-18
  Filled 2013-02-17: qty 1

## 2013-02-17 MED ORDER — LAMOTRIGINE 200 MG PO TABS
400.0000 mg | ORAL_TABLET | Freq: Two times a day (BID) | ORAL | Status: DC
  Administered 2013-02-17 – 2013-02-18 (×3): 400 mg via ORAL
  Filled 2013-02-17 (×4): qty 2

## 2013-02-17 MED ORDER — WARFARIN - PHARMACIST DOSING INPATIENT: Freq: Every day | Status: DC

## 2013-02-17 MED ORDER — LACOSAMIDE 200 MG PO TABS
200.0000 mg | ORAL_TABLET | Freq: Two times a day (BID) | ORAL | Status: DC
  Administered 2013-02-17 – 2013-02-18 (×3): 200 mg via ORAL
  Filled 2013-02-17 (×3): qty 1

## 2013-02-17 MED ORDER — FLUOXETINE HCL 20 MG PO CAPS
40.0000 mg | ORAL_CAPSULE | Freq: Every day | ORAL | Status: DC
  Administered 2013-02-17: 40 mg via ORAL
  Filled 2013-02-17 (×2): qty 2

## 2013-02-17 NOTE — ED Provider Notes (Signed)
CSN: [id number]     Arrival date & time 02/17/13  0129 History   First MD Initiated Contact with Patient 02/17/13 0303     Chief Complaint  Patient presents with  . Seizures   (Consider location/radiation/quality/duration/timing/severity/associated sxs/prior Treatment) HPI  Please note that this is a late entry. This patient was seen by me on 02/17/13.   Patient is an unfortunate man with a history of CVA and seizure disorder. He also suffers from chronic pain and is s/p recent right BKA. He is here, according to his wife, with pain related seizures.  The wife says that the patient was discharged from the hospital yesterday but, her pharmacy would not fill his prescriptions which included all of his AEDs because she had cut the 8 x 11 paper with 4 scripts on it into individual sections - each containing a single script. She believes that his seizures are related to his lack of medication in 24 hrs.   His seizures are "sometimes like shaking" and sometimes he yells out in pain.   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   Past Medical History  Diagnosis Date  . Anxiety   . Depression   . Seizures   . CVA (cerebrovascular accident due to intracerebral hemorrhage) 2004  . Arthritis   . MRSA (methicillin resistant Staphylococcus aureus)   . S/P BKA (below knee amputation) unilateral     Right   . Thyroid disease   . Cancer     Osteosarcoma, right leg   Past Surgical History  Procedure Laterality Date  . Fracture surgery      Rt. femur, folllowed by MRSA  . Brain surgery  2004    cerebral aneurysm  . Total knee arthroplasty Right   . Above knee leg amputation Right     Osteosarcoma   Family History  Problem Relation Age of Onset  . Anxiety disorder Mother   . Diabetes Father    History  Substance Use Topics  . Smoking status: Former Games developer  . Smokeless tobacco: Never Used  . Alcohol Use: No   OB History   Grav Para  Term Preterm Abortions TAB SAB Ect Mult Living                 Review of Systems 10 point ROS obtained and is negative with the exception of sx noted above.   Allergies  Vicodin and Morphine and related  Home Medications   Current Outpatient Rx  Name  Route  Sig  Dispense  Refill  . Ascorbic Acid (VITAMIN C) 1000 MG tablet      TAKE 1 TABLET BY MOUTH EVERY DAY         . Calcium Carbonate-Vitamin D 600-400 MG-UNIT per tablet      1 tablet.         . ferrous sulfate 325 (65 FE) MG tablet   Oral   Take 325 mg by mouth daily with breakfast.         . FLUoxetine (PROZAC) 20 MG capsule   Oral   Take 60 mg by mouth daily.         [name] FLUoxetine (PROZAC) 40 MG capsule   Oral   Take 40 mg by mouth at bedtime. Take 1 capsule daily         . Iron-FA-B Cmp-C-Biot-Probiotic (FUSION PLUS) CAPS      1 capsule.         . lacosamide (  VIMPAT) 200 MG TABS tablet   Oral   Take 1 tablet (200 mg total) by mouth 2 (two) times daily.   60 tablet   5   . lamoTRIgine (LAMICTAL) 200 MG tablet   Oral   Take 400 mg by mouth 2 (two) times daily.          [name] levothyroxine (SYNTHROID, LEVOTHROID) 25 MCG tablet   Oral   Take 25 mcg by mouth daily.         [name] warfarin (COUMADIN) 5 MG tablet   Oral   Take 7.5-10 mg by mouth daily. Takes 7.5mg  on Monday, Wednesday, Friday and Saturday Takes 10mg  on Sunday, Tuesday, and Thursday         . promethazine (PHENERGAN) 25 MG tablet   Oral   Take 25 mg by mouth every 6 (six) hours as needed. nausea          BP 113/75  Pulse 77  Temp(Src) 98.1 F (36.7 C) (Oral)  Resp 16  SpO2 100%  LMP 02/09/2013 Physical Exam  Constitutional: She is oriented to person, place, and time. She appears well-developed and well-nourished.  Chronically ill appearing  HENT:  Head: Normocephalic and atraumatic.  No intra-oral trauma identified  Eyes: Conjunctivae are normal. Pupils are equal, round, and reactive to light.  Neck: Normal range of  motion. Neck supple.  Cardiovascular: Normal rate, regular rhythm, normal heart sounds and intact distal pulses.   No murmur heard. Pulmonary/Chest: Effort normal and breath sounds normal. No respiratory distress.  Abdominal: Soft. Bowel sounds are normal. She exhibits no distension.  Musculoskeletal:  S/P right BKA with healthy appearing stump, otherwise normal extremities which are nontender  Neurological: She is alert and oriented to person, place, and time. No cranial nerve deficit.  Skin: Skin is warm and dry.    ED Course  Procedures (including critical care time) Labs Review Labs Reviewed  CBC - Abnormal; Notable for the following:    WBC 3.8 (*)    All other components within normal limits  COMPREHENSIVE METABOLIC PANEL   CT Head Wo Contrast (Final result)  Result time: 02/17/13 03:28:19    Final result by Rad Results In Interface (02/17/13 03:28:19)    Narrative:   CLINICAL DATA: Seizures.  EXAM: CT HEAD WITHOUT CONTRAST  TECHNIQUE: Contiguous axial images were obtained from the base of the skull through the vertex without intravenous contrast.  COMPARISON: None.  FINDINGS: There is no evidence of acute infarction, mass lesion, or intra- or extra-axial hemorrhage on CT.  Chronic encephalomalacia is noted at the right temporal and parietal lobes, with postoperative change at the right parietal and frontal lobes, and associated ex vacuo dilatation of the right lateral ventricle. This involves part of the right basal ganglia. There is mild rightward midline shift, reflecting right-sided volume loss.  The posterior fossa, including the cerebellum, brainstem and fourth ventricle, is within normal limits. Apparent chronic right-sided subdural thickening is noted, underlying the patient's craniotomy flap and craniectomy defect.  There is no evidence of fracture; visualized osseous structures are unremarkable in appearance. The visualized portions of the  orbits are within normal limits. The paranasal sinuses and mastoid air cells are well-aerated. No significant soft tissue abnormalities are seen.  IMPRESSION: 1. No acute intracranial pathology seen on CT. 2. Chronic right-sided postoperative changes seen, with right-sided volume loss and encephalomalacia. Apparent chronic right-sided subdural thickening noted.   Electronically Signed By: Roanna Raider M.D. On: 02/17/2013 03:28     MDM  Patient loaded with his usual doses of AEDs and I have written prescriptions for same. He is stable for d/c with plan to f/u with his neurologist.     Brandt Loosen, MD 03/01/13 [phone]

## 2013-02-17 NOTE — ED Notes (Addendum)
EDP at bedside, believes pt may be seizing, ordered 1mg  of ativan. Phelbotomy at bedside able to draw blood from pt's arm, pt laying in bed, no movement noted. Family states that she was shaking a little when the MD was at the bedside.

## 2013-02-17 NOTE — ED Notes (Signed)
Per EMS: pt has hx of seizures, pt also has hx of brain surgery. Pt mom is caretaker and states that her daughter has several kinds of seizures, some that her daughter can talk and respond through. Pt mom states that today for she was moving her head to the side and she was non verbal for 30 min

## 2013-02-17 NOTE — H&P (Signed)
[facility] History and Physical  Cassandra Andrade ZDG:[id number] DOB: Mar 01, 1965    PCP:   Joneen Roach, NP   Chief Complaint: seizure.  HPI: Cassandra Andrade is an 47 y.o. 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  She was under the care of Dr Stephanie Acre, and had video EEG done where there were inconsistency between her seizure and the EEG seizure activities.  She was thought to have focal seizure, where her mother was able to talk her out of her seizure at times.  Her tegretal was tapered and discontinue, and she was recommended to be on Lamictal and Vimpat.  She reportedly had no fever.  Work up in the ER included a head CT which showed no acute process, old prior surgery and subdural thickening and encephalomalacia.  She has a WBC of 3.8K, normal renal fx tests, and her UDS showed only opiate.  Her UA showed no evidence of infection.  She was loaded with IV Keppra and hospitalist was asked to admit her for seizure.  She was able to maintain her hemodynamic, ventilation, and oral airway.    Rewiew of Systems: She is post ictal, unable to do ROS.  Past Medical History  Diagnosis Date  . Anxiety   . Depression   . Seizures   . CVA (cerebrovascular accident due to intracerebral hemorrhage) 2004  . Arthritis   . MRSA (methicillin resistant Staphylococcus aureus)   . S/P BKA (below knee amputation) unilateral     Right   . Thyroid disease   . Cancer     Osteosarcoma, right leg    Past Surgical History  Procedure Laterality Date  . Fracture surgery      Rt. femur, folllowed by MRSA  . Brain surgery  2004    cerebral aneurysm  . Total knee arthroplasty Right   . Above knee leg amputation Right     Osteosarcoma    Medications:  HOME MEDS: Prior to Admission medications   Medication Sig Start Date End Date Taking? Authorizing  Provider  Ascorbic Acid (VITAMIN C) 1000 MG tablet TAKE 1 TABLET BY MOUTH EVERY DAY 08/17/11  Yes Historical Provider, MD  Calcium Carbonate-Vitamin D 600-400 MG-UNIT per tablet 1 tablet.   Yes Historical Provider, MD  ferrous sulfate 325 (65 FE) MG tablet Take 325 mg by mouth daily with breakfast.   Yes Historical Provider, MD  FLUoxetine (PROZAC) 20 MG capsule Take 60 mg by mouth daily.   Yes Historical Provider, MD  FLUoxetine (PROZAC) 40 MG capsule Take 40 mg by mouth at bedtime. Take 1 capsule daily 01/01/13  Yes Cleotis Nipper, MD  Iron-FA-B Cmp-C-Biot-Probiotic (FUSION PLUS) CAPS 1 capsule.   Yes Historical Provider, MD  lacosamide (VIMPAT) 200 MG TABS tablet Take 1 tablet (200 mg total) by mouth 2 (two) times daily. 01/08/13  Yes Nilda Riggs, NP  lamoTRIgine (LAMICTAL) 200 MG tablet Take 400 mg by mouth 2 (two) times daily.    Yes Historical Provider, MD  levothyroxine (SYNTHROID, LEVOTHROID) 25 MCG tablet Take 25 mcg by mouth daily.   Yes Historical Provider, MD  warfarin (COUMADIN) 5 MG tablet Take 7.5-10 mg by mouth daily. Takes 7.5mg  on Monday, Wednesday, Friday and Saturday Takes 10mg  on Sunday, Tuesday, and Thursday   Yes Historical Provider, MD  promethazine (PHENERGAN) 25 MG tablet Take 25 mg by mouth every  6 (six) hours as needed. nausea    Historical Provider, MD     Allergies:  Allergies  Allergen Reactions  . Vicodin [Hydrocodone-Acetaminophen] Nausea And Vomiting  . Morphine And Related Rash    Allergic reaction only in iv form    Social History:   reports that she has quit smoking. She has never used smokeless tobacco. She reports that she does not drink alcohol or use illicit drugs.  Family History: Family History  Problem Relation Age of Onset  . Anxiety disorder Mother   . Diabetes Father      Physical Exam: Filed Vitals:   02/17/13 0130 02/17/13 0133 02/17/13 0230 02/17/13 0632  BP: 124/80 124/80 113/75 101/68  Pulse: 77  77 77  Temp:  98.1 F  (36.7 C)    TempSrc:  Oral    Resp:  16  12  SpO2: 100% 100% 100% 99%   Blood pressure 101/68, pulse 77, temperature 98.1 F (36.7 C), temperature source Oral, resp. rate 12, last menstrual period 02/09/2013, SpO2 99.00%.  GEN:  Unresponsive, except to painful stimuli. HEENT: Mucous membranes pink and anicteric; PERRLA; no cervical lymphadenopathy nor thyromegaly or carotid bruit; no JVD; There were no stridor. Neck is very supple. Pupil pinpoint. Breasts:: Not examined CHEST WALL: No tenderness CHEST: Normal respiration, clear to auscultation bilaterally.  HEART: Regular rate and rhythm.  There are no murmur, rub, or gallops.   BACK: No kyphosis or scoliosis; no CVA tenderness ABDOMEN: soft and non-tender; no masses, no organomegaly, normal abdominal bowel sounds; no pannus; no intertriginous candida. There is no rebound and no distention. Rectal Exam: Not done EXTREMITIES: No bone or joint deformity; age-appropriate arthropathy of the hands and knees; no edema; no ulcerations.  There is no calf tenderness.  S/P right BKA. Genitalia: not examined PULSES: 2+ and symmetric SKIN: Normal hydration no rash or ulceration CNS: She has facial symmetry.  Moves with noxious stimuli. Labs on Admission:  Basic Metabolic Panel:  Recent Labs Lab 02/17/13 0313  NA 138  K 4.1  CL 100  CO2 28  GLUCOSE 83  BUN 15  CREATININE 0.88  CALCIUM 9.2   Liver Function Tests:  Recent Labs Lab 02/17/13 0313  AST 26  ALT 33  ALKPHOS 105  BILITOT 0.2*  PROT 7.7  ALBUMIN 3.8   No results found for this basename: LIPASE, AMYLASE,  in the last 168 hours No results found for this basename: AMMONIA,  in the last 168 hours CBC:  Recent Labs Lab 02/17/13 0200  WBC 3.8*  HGB 12.9  HCT 40.6  MCV 88.1  PLT 187   Cardiac Enzymes: No results found for this basename: CKTOTAL, CKMB, CKMBINDEX, TROPONINI,  in the last 168 hours  CBG: No results found for this basename: GLUCAP,  in the last 168  hours   Radiological Exams on Admission: Ct Head Wo Contrast  02/17/2013   CLINICAL DATA:  Seizures.  EXAM: CT HEAD WITHOUT CONTRAST  TECHNIQUE: Contiguous axial images were obtained from the base of the skull through the vertex without intravenous contrast.  COMPARISON:  None.  FINDINGS: There is no evidence of acute infarction, mass lesion, or intra- or extra-axial hemorrhage on CT.  Chronic encephalomalacia is noted at the right temporal and parietal lobes, with postoperative change at the right parietal and frontal lobes, and associated ex vacuo dilatation of the right lateral ventricle. This involves part of the right basal ganglia. There is mild rightward midline shift, reflecting right-sided volume loss.  The posterior fossa, including the cerebellum, brainstem and fourth ventricle, is within normal limits. Apparent chronic right-sided subdural thickening is noted, underlying the patient's craniotomy flap and craniectomy defect.  There is no evidence of fracture; visualized osseous structures are unremarkable in appearance. The visualized portions of the orbits are within normal limits. The paranasal sinuses and mastoid air cells are well-aerated. No significant soft tissue abnormalities are seen.  IMPRESSION: 1. No acute intracranial pathology seen on CT. 2. Chronic right-sided postoperative changes seen, with right-sided volume loss and encephalomalacia. Apparent chronic right-sided subdural thickening noted.   Electronically Signed   By: Roanna Raider M.D.   On: 02/17/2013 03:28   Dg Chest Portable 1 View  02/17/2013   CLINICAL DATA:  Seizure.  EXAM: PORTABLE CHEST - 1 VIEW  COMPARISON:  None.  FINDINGS: The lungs are hypoexpanded but appear grossly clear. There is no evidence of focal opacification, pleural effusion or pneumothorax.  The cardiomediastinal silhouette is borderline normal in size. No acute osseous abnormalities are seen.  IMPRESSION: Lungs hypoexpanded but grossly clear.    Electronically Signed   By: Roanna Raider M.D.   On: 02/17/2013 04:25   Assessment/Plan Present on Admission:  . Seizure disorder . Hypothyroid . DVT, bilateral lower limbs . Chronic pain syndrome . Seizure  PLAN:  She will be admitted for generalized TC seizure.  I have continued Keppra after the loading dose of 500mg  at BID dosing.  Neuro has been consulted and will make further recommendation regarding her ACD.  She is stable, and her other meds will be continued.  She is able to maintain her oral airways.  Will admit her to telemetry, will place foley if she doesn't wake up.  For her hypothyrodism, will continue low dose synthroid she is on, and check TSH.  She is a full code, and will be admitted to [facility] service.  Thank you for asking me to participate in her care.  Other plans as per orders.  Code Status:FULL CODE.   Houston Siren, MD. [facility] Pager [phone] 7pm to 7am.  02/17/2013, 7:10 AM

## 2013-02-17 NOTE — Consult Note (Signed)
Reason for Consult: Recurrent seizure activity.  HPI:                                                                                                                                          Cassandra Andrade is an 47 y.o. female with a history of stroke, seizure disorder, anxiety and depression, DVT, MRSA and AKA amputation on the right, presenting with recurrent seizure. Patient has a history of partial seizure disorder. Seizure occurred around 11 PM last night. Patient had prolonged confusion afterwards as well as what appeared to be focal motor seizure activity while in the emergency room. She's currently taking Lamictal 200 mg twice a day and Vimpat 200 mg twice a day. She was given 1 mg of Ativan in the emergency room. She was also given a loading dose of Keppra 1000 mg IV. No further seizure activity has been reported. Patient has had video EEG monitoring at [facility]. She had a number of clinical spells without EEG correlate. As well, she had EEG evidence of seizure activity without clinical manifestations. She is followed locally by Dr. Anne Hahn. She's being admitted because of prolonged obtundation.  Past Medical History  Diagnosis Date  . Anxiety   . Depression   . Seizures   . CVA (cerebrovascular accident due to intracerebral hemorrhage) 2004  . Arthritis   . MRSA (methicillin resistant Staphylococcus aureus)   . S/P BKA (below knee amputation) unilateral     Right   . Thyroid disease   . Cancer     Osteosarcoma, right leg    Past Surgical History  Procedure Laterality Date  . Fracture surgery      Rt. femur, folllowed by MRSA  . Brain surgery  2004    cerebral aneurysm  . Total knee arthroplasty Right   . Above knee leg amputation Right     Osteosarcoma    Family History  Problem Relation Age of Onset  . Anxiety disorder Mother   . Diabetes Father     Social History:  reports that she has quit smoking. She has never used smokeless tobacco. She  reports that she does not drink alcohol or use illicit drugs.  Allergies  Allergen Reactions  . Vicodin [Hydrocodone-Acetaminophen] Nausea And Vomiting  . Morphine And Related Rash    Allergic reaction only in iv form    MEDICATIONS:  I have reviewed the patient's current medications.   ROS:                                                                                                                                       History obtained from mother  General ROS: negative for - chills, fatigue, fever, night sweats, weight gain or weight loss Psychological ROS: negative for - behavioral disorder, hallucinations, memory difficulties, mood swings or suicidal ideation Ophthalmic ROS: negative for - blurry vision, double vision, eye pain or loss of vision ENT ROS: negative for - epistaxis, nasal discharge, oral lesions, sore throat, tinnitus or vertigo Allergy and Immunology ROS: negative for - hives or itchy/watery eyes Hematological and Lymphatic ROS: negative for - bleeding problems, bruising or swollen lymph nodes Endocrine ROS: negative for - galactorrhea, hair pattern changes, polydipsia/polyuria or temperature intolerance Respiratory ROS: negative for - cough, hemoptysis, shortness of breath or wheezing Cardiovascular ROS: negative for - chest pain, dyspnea on exertion, edema or irregular heartbeat Gastrointestinal ROS: negative for - abdominal pain, diarrhea, hematemesis, nausea/vomiting or stool incontinence Genito-Urinary ROS: negative for - dysuria, hematuria, incontinence or urinary frequency/urgency Musculoskeletal ROS: negative for - joint swelling or muscular weakness Neurological ROS: as noted in HPI Dermatological ROS: negative for rash and skin lesion changes   Blood pressure 113/75, pulse 77, temperature 98.1 F (36.7 C), temperature source Oral, resp.  rate 16, last menstrual period 02/09/2013, SpO2 100.00%.   Neurologic Examination:                                                                                                      Patient was obtunded and couldn't be aroused for only a few seconds. No seizure activity was noted. She was aware that she was in the hospital there was not oriented otherwise. Pupils were equal and reacted normally to light. She was not corporative enough to evaluate extraocular movements and visual fields. No facial weakness was noted. Motor exam shows normal strength in all 4 extremities. Muscle tone was normal throughout. Deep tendon reflexes are normal and symmetrical in upper extremities as well as normal lower extremity on the left. Right plantar response was flexor.  No results found for this basename: cbc, bmp, coags, chol, tri, ldl, hga1c    Results for orders placed during the hospital encounter of 02/17/13 (from the past 48 hour(s))  CBC     Status: Abnormal   Collection Time    02/17/13  2:00 AM  Result Value Range   WBC 3.8 (*) 4.0 - 10.5 K/uL   RBC 4.61  3.87 - 5.11 MIL/uL   Hemoglobin 12.9  12.0 - 15.0 g/dL   HCT 16.1  09.6 - 04.5 %   MCV 88.1  78.0 - 100.0 fL   MCH 28.0  26.0 - 34.0 pg   MCHC 31.8  30.0 - 36.0 g/dL   RDW 40.9  81.1 - 91.4 %   Platelets 187  150 - 400 K/uL  COMPREHENSIVE METABOLIC PANEL     Status: Abnormal   Collection Time    02/17/13  3:13 AM      Result Value Range   Sodium 138  135 - 145 mEq/L   Potassium 4.1  3.5 - 5.1 mEq/L   Chloride 100  96 - 112 mEq/L   CO2 28  19 - 32 mEq/L   Glucose, Bld 83  70 - 99 mg/dL   BUN 15  6 - 23 mg/dL   Creatinine, Ser 7.82  0.50 - 1.10 mg/dL   Calcium 9.2  8.4 - 95.6 mg/dL   Total Protein 7.7  6.0 - 8.3 g/dL   Albumin 3.8  3.5 - 5.2 g/dL   AST 26  0 - 37 U/L   ALT 33  0 - 35 U/L   Alkaline Phosphatase 105  39 - 117 U/L   Total Bilirubin 0.2 (*) 0.3 - 1.2 mg/dL   GFR calc non Af Amer 78 (*) >90 mL/min   GFR calc  Af Amer 90 (*) >90 mL/min   Comment: (NOTE)     The eGFR has been calculated using the CKD EPI equation.     This calculation has not been validated in all clinical situations.     eGFR's persistently <90 mL/min signify possible Chronic Kidney     Disease.  PROTIME-INR     Status: Abnormal   Collection Time    02/17/13  3:33 AM      Result Value Range   Prothrombin Time 20.9 (*) 11.6 - 15.2 seconds   INR 1.86 (*) 0.00 - 1.49  LACTIC ACID, PLASMA     Status: None   Collection Time    02/17/13  3:33 AM      Result Value Range   Lactic Acid, Venous 0.5  0.5 - 2.2 mmol/L  TYPE AND SCREEN     Status: None   Collection Time    02/17/13  3:38 AM      Result Value Range   ABO/RH(D) O POS     Antibody Screen NEG     Sample Expiration 02/20/2013      Ct Head Wo Contrast  02/17/2013   CLINICAL DATA:  Seizures.  EXAM: CT HEAD WITHOUT CONTRAST  TECHNIQUE: Contiguous axial images were obtained from the base of the skull through the vertex without intravenous contrast.  COMPARISON:  None.  FINDINGS: There is no evidence of acute infarction, mass lesion, or intra- or extra-axial hemorrhage on CT.  Chronic encephalomalacia is noted at the right temporal and parietal lobes, with postoperative change at the right parietal and frontal lobes, and associated ex vacuo dilatation of the right lateral ventricle. This involves part of the right basal ganglia. There is mild rightward midline shift, reflecting right-sided volume loss.  The posterior fossa, including the cerebellum, brainstem and fourth ventricle, is within normal limits. Apparent chronic right-sided subdural thickening is noted, underlying the patient's craniotomy flap and craniectomy defect.  There is no evidence of fracture; visualized  osseous structures are unremarkable in appearance. The visualized portions of the orbits are within normal limits. The paranasal sinuses and mastoid air cells are well-aerated. No significant soft tissue  abnormalities are seen.  IMPRESSION: 1. No acute intracranial pathology seen on CT. 2. Chronic right-sided postoperative changes seen, with right-sided volume loss and encephalomalacia. Apparent chronic right-sided subdural thickening noted.   Electronically Signed   By: Roanna Raider M.D.   On: 02/17/2013 03:28   Dg Chest Portable 1 View  02/17/2013   CLINICAL DATA:  Seizure.  EXAM: PORTABLE CHEST - 1 VIEW  COMPARISON:  None.  FINDINGS: The lungs are hypoexpanded but appear grossly clear. There is no evidence of focal opacification, pleural effusion or pneumothorax.  The cardiomediastinal silhouette is borderline normal in size. No acute osseous abnormalities are seen.  IMPRESSION: Lungs hypoexpanded but grossly clear.   Electronically Signed   By: Roanna Raider M.D.   On: 02/17/2013 04:25   Assessment/Plan: 47 year old lady presenting with recurrent partial seizures with prolonged obtundation, which is likely secondary to combination of postictal state and sedation with Ativan.  Recommendations: 1. Admission for observation and telemetry for management of persistent obtundation. 2. Recommend continuing Keppra at 500 mg twice a day. 3. No change in current doses of Lamictal and the Vimpat. 4. EEG to rule out partial status epilepticus in a patient continues to be obtunded.  We will continue to follow this patient with you.  C.R. Roseanne Reno, MD [facility] [phone]  02/17/2013, 6:31 AM

## 2013-02-17 NOTE — ED Notes (Signed)
Pt placed on bedpan without difficulty. Vital signs stable. Pt and family updated on plan of care for admission.

## 2013-02-17 NOTE — Progress Notes (Signed)
Patient seen and evaluated earlier the same by my associate. Please refer to his H&P for details regarding assessment and plan.  Neurology involved and we will follow up with the recommendations.  We'll plan on reassessing next a.m.  Kaitland Lewellyn  Given that admission was secondary to neurological process disposition will be deferred to neurology.

## 2013-02-17 NOTE — Progress Notes (Signed)
ANTICOAGULATION CONSULT NOTE - Initial Consult  Pharmacy Consult for Warfarin Indication: hx of DVT  Allergies  Allergen Reactions  . Vicodin [Hydrocodone-Acetaminophen] Nausea And Vomiting  . Morphine And Related Rash    Allergic reaction only in iv form    Patient Measurements: Height: 5\' 7"  (170.2 cm) Weight: 166 lb 7.2 oz (75.5 kg) IBW/kg (Calculated) : 61.6  Vital Signs: Temp: 98.8 F (37.1 C) (12/27 1000) Temp src: Oral (12/27 1000) BP: 104/68 mmHg (12/27 1000) Pulse Rate: 80 (12/27 1000)  Labs:  Recent Labs  02/17/13 0200 02/17/13 0313 02/17/13 0333  HGB 12.9  --   --   HCT 40.6  --   --   PLT 187  --   --   LABPROT  --   --  20.9*  INR  --   --  1.86*  CREATININE  --  0.88  --     Estimated Creatinine Clearance: 84.7 ml/min (by C-G formula based on Cr of 0.88).   Medical History: Past Medical History  Diagnosis Date  . Anxiety   . Depression   . Seizures   . CVA (cerebrovascular accident due to intracerebral hemorrhage) 2004  . Arthritis   . MRSA (methicillin resistant Staphylococcus aureus)   . S/P BKA (below knee amputation) unilateral     Right   . Thyroid disease   . Cancer     Osteosarcoma, right leg    Medications:  Scheduled:  . calcium-vitamin D  1 tablet Oral q morning - 10a  . [START ON 02/18/2013] ferrous sulfate  325 mg Oral Q breakfast  . FLUoxetine  40 mg Oral QHS  . FLUoxetine  60 mg Oral Daily  . lacosamide  200 mg Oral BID  . lamoTRIgine  400 mg Oral BID  . levETIRAcetam  500 mg Intravenous Q12H  . levothyroxine  25 mcg Oral QAC breakfast  . sodium chloride  3 mL Intravenous Q12H    Assessment: 47 yo F w/ hx of seziures and CVA. Presents w/ partial seizures and prolonged obtundation. Pharmacy consulted to dose VTE treatment. Patient has history of DVT in bilateral lower limbs. Patient baseline INR 1.86, will continue patient on home regimen. Hgb & plt stable, with no signs of bleed noted.  Goal of Therapy:  INR  2-3 Monitor platelets by anticoagulation protocol: Yes   Plan:  - Start home warfarin 7.5mg  MWFSa and 10mg  TThSa - Monitor daily PT/INR and signs of bleed - Warfarin education completed  Forestine Na M 02/17/2013,11:57 AM

## 2013-02-18 DIAGNOSIS — S88119A Complete traumatic amputation at level between knee and ankle, unspecified lower leg, initial encounter: Secondary | ICD-10-CM

## 2013-02-18 DIAGNOSIS — R569 Unspecified convulsions: Secondary | ICD-10-CM

## 2013-02-18 LAB — PROTIME-INR
INR: 1.8 — ABNORMAL HIGH (ref 0.00–1.49)
Prothrombin Time: 20.4 seconds — ABNORMAL HIGH (ref 11.6–15.2)

## 2013-02-18 MED ORDER — LEVETIRACETAM 500 MG PO TABS: 500.0000 mg | ORAL_TABLET | Freq: Two times a day (BID) | ORAL | Status: DC

## 2013-02-18 MED ORDER — ALUM & MAG HYDROXIDE-SIMETH 200-200-20 MG/5ML PO SUSP
30.0000 mL | ORAL | Status: DC | PRN
  Administered 2013-02-18: 30 mL via ORAL
  Filled 2013-02-18: qty 30

## 2013-02-18 NOTE — Discharge Summary (Signed)
Physician Discharge Summary  Velicia Dejager ZOX:[id number] DOB: [birth year] DOA: 02/17/2013  PCP: Joneen Roach, NP  Admit date: 02/17/2013 Discharge date: 02/18/2013  Time spent: <30 minutes  Recommendations for Outpatient Follow-up:  Follow-up Information   Follow up with Joneen Roach, NP. (on Tues-for Lab/INR, and further dose coumadin)    Specialty:  Nurse Practitioner   Contact information:   [phone] [address] [place] [address] [phone]       Follow up with Lesly Dukes, MD. (in 1-2weeks, call for appt upon discharge)    Specialty:  Neurology   Contact information:   [address] [place] [address] [phone]       Pending labs -Lamictal level  Discharge Diagnoses:  Active Problems:   Hypothyroid   Seizure disorder   Chronic pain syndrome   S/P BKA (below knee amputation)   DVT, bilateral lower limbs   Seizure   Discharge Condition: improved/stable  Diet recommendation: heart healthy  Filed Weights   02/17/13 1000  Weight: 75.5 kg (166 lb 7.2 oz)    History of present illness:  Cassandra Andrade is an 47 y.o. 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 She was under the care of Dr Stephanie Acre, and had video EEG done where there were inconsistency between her seizure and the EEG seizure activities. She was thought to have focal seizure, where her mother was able to talk her out of her seizure at times. Her tegretal was tapered and discontinue, and she was recommended to be on Lamictal and Vimpat. She reportedly had no fever. Work up in the ER included a head CT which showed no acute process, old prior surgery and subdural thickening and encephalomalacia. She has a WBC of 3.8K, normal renal fx tests, and her UDS showed only opiate. Her UA showed no evidence of infection.  She was loaded with IV Keppra and hospitalist was asked to admit her for seizure. She was able to maintain her hemodynamic, ventilation, and oral airway. She was admitted for further evaluation and management      Hospital Course:  Present on Admission:  . Seizures  As discussed above, upon admission patient was loaded with IV Keppra , neurology was consulted and followed her in the hospital and she was maintained on keppra. it was noted that patient had been on Lamictal and Vimpat and they were continued. Neuro/Dr. Edgar Frisk followed up today and the Lamictal level is still pending, he states it may be reasonable to increase Lamictal and have on dual therapy but with her level still pending he is hesitant to make these changes. Patient has been seizure free overnight on the Keppra, Lamictal and vimpat and he recommends to continue these and followup with Dr. Anne Hahn for lamictal level results and further monitoring and management as appropriate. She is medically ready for discharge at this time and is to follow up outpatient.  . Hypothyroid -Continue Synthroid  . DVT, bilateral lower limbs -Continue Coumadin, her INR today is 1.8, she is to take Coumadin 10 mg today followup with her PCP in a.m. for PT/INR level and further Coumadin dosing as appropriate  . Chronic pain syndrome -Continue outpatient medications    Procedures:  none  Consultations:  neuro  Discharge Exam: Filed Vitals:   02/18/13 0537  BP: 101/69  Pulse: 83  Temp: 98.1  F (36.7 C)  Resp: 18    Exam:  General: alert & oriented x 3 In NAD Cardiovascular: RRR, nl S1 s2 Respiratory: CTAB Abdomen: soft +BS NT/ND, no masses palpable Extremities: s/p R. BKA; No cyanosis and no edema    Discharge Instructions  Discharge Orders   Future Appointments Provider Department Dept Phone   03/26/2013 3:00 PM Geanie Berlin, [place] [facility] [place] [phone]   07/09/2013 3:00 PM Nilda Riggs, NP [facility] [phone]   Future Orders Complete By Expires   Diet - low sodium heart healthy  As directed    Increase activity slowly  As directed        Medication List         Calcium Carbonate-Vitamin D 600-400 MG-UNIT per tablet  1 tablet.     ferrous sulfate 325 (65 FE) MG tablet  Take 325 mg by mouth daily with breakfast.     FLUoxetine 40 MG capsule  Commonly known as:  PROZAC  Take 40 mg by mouth at bedtime. Take 1 capsule daily     FLUoxetine 20 MG capsule  Commonly known as:  PROZAC  Take 60 mg by mouth daily.     FUSION PLUS Caps  1 capsule.     lacosamide 200 MG Tabs tablet  Commonly known as:  VIMPAT  Take 1 tablet (200 mg total) by mouth 2 (two) times daily.     lamoTRIgine 200 MG tablet  Commonly known as:  LAMICTAL  Take 400 mg by mouth 2 (two) times daily.     levETIRAcetam 500 MG tablet  Commonly known as:  KEPPRA  Take 1 tablet (500 mg total) by mouth 2 (two) times daily.     levothyroxine 25 MCG tablet  Commonly known as:  SYNTHROID, LEVOTHROID  Take 25 mcg by mouth daily.     promethazine 25 MG tablet  Commonly known as:  PHENERGAN  Take 25 mg by mouth every 6 (six) hours as needed. nausea     vitamin C 1000 MG tablet  TAKE 1 TABLET BY MOUTH EVERY DAY     warfarin 5 MG tablet  Commonly known as:  COUMADIN  - Take 7.5-10 mg by mouth daily. Takes 7.5mg  on Monday, Wednesday, Friday and Saturday  - Takes 10mg  on Sunday, Tuesday, and Thursday       Allergies  Allergen Reactions  . Vicodin [Hydrocodone-Acetaminophen] Nausea And Vomiting  . Morphine And Related Rash    Allergic reaction only in iv form       Follow-up Information   Follow up with Joneen Roach, NP. (on Tues-for Lab/INR, and further dose coumadin)    Specialty:  Nurse Practitioner   Contact information:   [phone] [place] [address] [phone]       Follow up with Lesly Dukes, MD. (in  1-2weeks, call for appt upon discharge)    Specialty:  Neurology   Contact information:   [address] [place] [address] [phone]        The results of significant diagnostics from this hospitalization (including imaging, microbiology, ancillary and laboratory) are listed below for reference.    Significant Diagnostic Studies: Ct Head Wo Contrast  02/17/2013   CLINICAL DATA:  Seizures.  EXAM: CT HEAD WITHOUT CONTRAST  TECHNIQUE: Contiguous axial images were obtained from the base of the skull through the vertex without intravenous contrast.  COMPARISON:  None.  FINDINGS: There is no evidence  of acute infarction, mass lesion, or intra- or extra-axial hemorrhage on CT.  Chronic encephalomalacia is noted at the right temporal and parietal lobes, with postoperative change at the right parietal and frontal lobes, and associated ex vacuo dilatation of the right lateral ventricle. This involves part of the right basal ganglia. There is mild rightward midline shift, reflecting right-sided volume loss.  The posterior fossa, including the cerebellum, brainstem and fourth ventricle, is within normal limits. Apparent chronic right-sided subdural thickening is noted, underlying the patient's craniotomy flap and craniectomy defect.  There is no evidence of fracture; visualized osseous structures are unremarkable in appearance. The visualized portions of the orbits are within normal limits. The paranasal sinuses and mastoid air cells are well-aerated. No significant soft tissue abnormalities are seen.  IMPRESSION: 1. No acute intracranial pathology seen on CT. 2. Chronic right-sided postoperative changes seen, with right-sided volume loss and encephalomalacia. Apparent chronic right-sided subdural thickening noted.   Electronically Signed   By: Roanna Raider M.D.   On: 02/17/2013 03:28   Dg Chest Portable 1 View  02/17/2013   CLINICAL DATA:  Seizure.  EXAM: PORTABLE CHEST - 1 VIEW   COMPARISON:  None.  FINDINGS: The lungs are hypoexpanded but appear grossly clear. There is no evidence of focal opacification, pleural effusion or pneumothorax.  The cardiomediastinal silhouette is borderline normal in size. No acute osseous abnormalities are seen.  IMPRESSION: Lungs hypoexpanded but grossly clear.   Electronically Signed   By: Roanna Raider M.D.   On: 02/17/2013 04:25    Microbiology: No results found for this or any previous visit (from the past 240 hour(s)).   Labs: Basic Metabolic Panel:  Recent Labs Lab 02/17/13 0313  NA 138  K 4.1  CL 100  CO2 28  GLUCOSE 83  BUN 15  CREATININE 0.88  CALCIUM 9.2   Liver Function Tests:  Recent Labs Lab 02/17/13 0313  AST 26  ALT 33  ALKPHOS 105  BILITOT 0.2*  PROT 7.7  ALBUMIN 3.8   No results found for this basename: LIPASE, AMYLASE,  in the last 168 hours No results found for this basename: AMMONIA,  in the last 168 hours CBC:  Recent Labs Lab 02/17/13 0200  WBC 3.8*  HGB 12.9  HCT 40.6  MCV 88.1  PLT 187   Cardiac Enzymes: No results found for this basename: CKTOTAL, CKMB, CKMBINDEX, TROPONINI,  in the last 168 hours BNP: BNP (last 3 results)  Recent Labs  11/04/12 1227  PROBNP 74.6   CBG: No results found for this basename: GLUCAP,  in the last 168 hours     Signed:  Richelle Glick C  [facility] 02/18/2013, 9:50 AM

## 2013-02-18 NOTE — Progress Notes (Signed)
ANTICOAGULATION CONSULT NOTE - Initial Consult  Pharmacy Consult for Warfarin Indication: hx of DVT  Allergies  Allergen Reactions  . Vicodin [Hydrocodone-Acetaminophen] Nausea And Vomiting  . Morphine And Related Rash    Allergic reaction only in iv form    Patient Measurements: Height: 5\' 7"  (170.2 cm) Weight: 166 lb 7.2 oz (75.5 kg) IBW/kg (Calculated) : 61.6  Vital Signs: Temp: 98.1 F (36.7 C) (12/28 0537) Temp src: Oral (12/28 0537) BP: 101/69 mmHg (12/28 0537) Pulse Rate: 83 (12/28 0537)  Labs:  Recent Labs  02/17/13 0200 02/17/13 0313 02/17/13 0333 02/18/13 0350  HGB 12.9  --   --   --   HCT 40.6  --   --   --   PLT 187  --   --   --   LABPROT  --   --  20.9* 20.4*  INR  --   --  1.86* 1.80*  CREATININE  --  0.88  --   --     Estimated Creatinine Clearance: 84.7 ml/min (by C-G formula based on Cr of 0.88).   Medical History: Past Medical History  Diagnosis Date  . Anxiety   . Depression   . Seizures   . CVA (cerebrovascular accident due to intracerebral hemorrhage) 2004  . Arthritis   . MRSA (methicillin resistant Staphylococcus aureus)   . S/P BKA (below knee amputation) unilateral     Right   . Thyroid disease   . Cancer     Osteosarcoma, right leg    Medications:  Scheduled:  . calcium-vitamin D  1 tablet Oral q morning - 10a  . ferrous sulfate  325 mg Oral Q breakfast  . FLUoxetine  40 mg Oral QHS  . FLUoxetine  60 mg Oral Daily  . lacosamide  200 mg Oral BID  . lamoTRIgine  400 mg Oral BID  . levETIRAcetam  500 mg Intravenous Q12H  . levothyroxine  25 mcg Oral QAC breakfast  . sodium chloride  3 mL Intravenous Q12H  . warfarin  7.5 mg Oral Q M,W,F,Sa-1800   And  . warfarin  10 mg Oral Custom  . Warfarin - Pharmacist Dosing Inpatient   Does not apply q1800    Assessment: 47 yo F w/ presents w/ partial seizures and prolonged obtundation. Patient has history of DVT in bilateral lower limbs. Patient baseline INR 1.86, will continue  patient on home regimen. INR returned subtherapeutic will receive 10mg  on home regimen today. Hgb & plt stable, with no signs of bleed noted.  Goal of Therapy:  INR 2-3 Monitor platelets by anticoagulation protocol: Yes   Plan:  - Continue home regimen of warfarin 7.5mg  MWFSa and 10mg  TThSa - Monitor daily PT/INR and signs of bleed  Forestine Na M 02/18/2013,8:01 AM

## 2013-02-18 NOTE — Progress Notes (Signed)
Subjective: No further seizures overnight.   She typically has 1 - 2 sz per day.   Exam: Filed Vitals:   02/18/13 0537  BP: 101/69  Pulse: 83  Temp: 98.1 F (36.7 C)  Resp: 18   Gen: In bed, eating breakfast MS: Awake, Alert, interactive and appropriate. Very talkative, no signs of aphasia.  ZO:XWRUE, Left eye is externally deviated compared to right(motehr states baseline), denies diploplia.  Motor: MAEW, right aka Sensory:Decreased o nleft side.    Impression: 47 yo F with recurrent seizure activity, now back to baseline. It may be reasonable to increase lamictal and have her on duotherapy, but without a level, I would be hesitant to make changes at this time.   Recommendations: 1)Continue vimpat 200mg  BID 2) Continue lamictal 200mg  BID 3) Continue keppra 500mg  BID 4) F/U with Dr. Anne Hahn 5) Neurology will sign off at this time, please call with any further questions.   Ritta Slot, MD [facility] [phone]  If 7pm- 7am, please page neurology on call at [phone].

## 2013-02-19 ENCOUNTER — Telehealth: Payer: Self-pay | Admitting: Neurology

## 2013-02-19 LAB — URINE CULTURE

## 2013-02-19 NOTE — Progress Notes (Signed)
Utilization review completed- retro 

## 2013-02-20 DIAGNOSIS — Z85830 Personal history of malignant neoplasm of bone: Secondary | ICD-10-CM

## 2013-02-20 DIAGNOSIS — R791 Abnormal coagulation profile: Secondary | ICD-10-CM | POA: Diagnosis present

## 2013-02-20 DIAGNOSIS — Z8614 Personal history of Methicillin resistant Staphylococcus aureus infection: Secondary | ICD-10-CM

## 2013-02-20 DIAGNOSIS — S78119A Complete traumatic amputation at level between unspecified hip and knee, initial encounter: Secondary | ICD-10-CM

## 2013-02-20 DIAGNOSIS — Z87891 Personal history of nicotine dependence: Secondary | ICD-10-CM

## 2013-02-20 DIAGNOSIS — E079 Disorder of thyroid, unspecified: Secondary | ICD-10-CM | POA: Diagnosis present

## 2013-02-20 DIAGNOSIS — I82409 Acute embolism and thrombosis of unspecified deep veins of unspecified lower extremity: Secondary | ICD-10-CM | POA: Diagnosis present

## 2013-02-20 DIAGNOSIS — F411 Generalized anxiety disorder: Secondary | ICD-10-CM | POA: Diagnosis present

## 2013-02-20 DIAGNOSIS — Z96659 Presence of unspecified artificial knee joint: Secondary | ICD-10-CM

## 2013-02-20 DIAGNOSIS — Z8673 Personal history of transient ischemic attack (TIA), and cerebral infarction without residual deficits: Secondary | ICD-10-CM

## 2013-02-20 DIAGNOSIS — F329 Major depressive disorder, single episode, unspecified: Secondary | ICD-10-CM | POA: Diagnosis present

## 2013-02-20 DIAGNOSIS — G40802 Other epilepsy, not intractable, without status epilepticus: Principal | ICD-10-CM | POA: Diagnosis present

## 2013-02-20 DIAGNOSIS — F3289 Other specified depressive episodes: Secondary | ICD-10-CM | POA: Diagnosis present

## 2013-02-20 DIAGNOSIS — Z7901 Long term (current) use of anticoagulants: Secondary | ICD-10-CM

## 2013-02-20 DIAGNOSIS — Z79899 Other long term (current) drug therapy: Secondary | ICD-10-CM

## 2013-02-20 NOTE — ED Notes (Signed)
At approx. 11pm pt had what seemed to be an [employer]. Pt became violently post ictal. Given 5mg  midazolam intranasally. 20 IV L hand. Given 2.5 mg midazolam IV.

## 2013-02-21 ENCOUNTER — Inpatient Hospital Stay (HOSPITAL_COMMUNITY): Payer: Medicare Other

## 2013-02-21 ENCOUNTER — Inpatient Hospital Stay (HOSPITAL_COMMUNITY)
Admission: EM | Admit: 2013-02-21 | Discharge: 2013-02-22 | DRG: 101 | Disposition: A | Discharge status: Home / Self Care | Payer: Medicare Other | Attending: Internal Medicine | Admitting: Internal Medicine

## 2013-02-21 ENCOUNTER — Emergency Department (HOSPITAL_COMMUNITY): Payer: Medicare Other

## 2013-02-21 DIAGNOSIS — E039 Hypothyroidism, unspecified: Secondary | ICD-10-CM

## 2013-02-21 DIAGNOSIS — R569 Unspecified convulsions: Secondary | ICD-10-CM | POA: Diagnosis present

## 2013-02-21 DIAGNOSIS — G40909 Epilepsy, unspecified, not intractable, without status epilepticus: Secondary | ICD-10-CM

## 2013-02-21 DIAGNOSIS — S88119A Complete traumatic amputation at level between knee and ankle, unspecified lower leg, initial encounter: Secondary | ICD-10-CM

## 2013-02-21 DIAGNOSIS — F329 Major depressive disorder, single episode, unspecified: Secondary | ICD-10-CM

## 2013-02-21 DIAGNOSIS — Z89519 Acquired absence of unspecified leg below knee: Secondary | ICD-10-CM

## 2013-02-21 DIAGNOSIS — I82409 Acute embolism and thrombosis of unspecified deep veins of unspecified lower extremity: Secondary | ICD-10-CM

## 2013-02-21 DIAGNOSIS — G894 Chronic pain syndrome: Secondary | ICD-10-CM

## 2013-02-21 DIAGNOSIS — I82403 Acute embolism and thrombosis of unspecified deep veins of lower extremity, bilateral: Secondary | ICD-10-CM

## 2013-02-21 LAB — CBC
Hemoglobin: 11.3 g/dL — ABNORMAL LOW (ref 12.0–15.0)
Hemoglobin: 11.8 g/dL — ABNORMAL LOW (ref 12.0–15.0)
MCH: 27.1 pg (ref 26.0–34.0)
MCH: 27.6 pg (ref 26.0–34.0)
MCHC: 31 g/dL (ref 30.0–36.0)
MCV: 87.3 fL (ref 78.0–100.0)
MCV: 88.1 fL (ref 78.0–100.0)
Platelets: 192 10*3/uL (ref 150–400)
Platelets: 209 10*3/uL (ref 150–400)
RBC: 4.28 MIL/uL (ref 3.87–5.11)

## 2013-02-21 LAB — POCT I-STAT, CHEM 8
BUN: 15 mg/dL (ref 6–23)
Calcium, Ion: 1.24 mmol/L — ABNORMAL HIGH (ref 1.12–1.23)
Creatinine, Ser: 1.1 mg/dL (ref 0.50–1.10)
Glucose, Bld: 89 mg/dL (ref 70–99)
Hemoglobin: 13.6 g/dL (ref 12.0–15.0)
Sodium: 142 mEq/L (ref 137–147)
TCO2: 28 mmol/L (ref 0–100)

## 2013-02-21 LAB — PROTIME-INR
INR: 1.39 (ref 0.00–1.49)
Prothrombin Time: 16.7 seconds — ABNORMAL HIGH (ref 11.6–15.2)

## 2013-02-21 LAB — URINALYSIS, DIPSTICK ONLY
Bilirubin Urine: NEGATIVE
Glucose, UA: NEGATIVE mg/dL
Ketones, ur: NEGATIVE mg/dL
Urobilinogen, UA: 0.2 mg/dL (ref 0.0–1.0)
pH: 6 (ref 5.0–8.0)

## 2013-02-21 MED ORDER — ONDANSETRON HCL 4 MG PO TABS: 4.0000 mg | ORAL_TABLET | Freq: Four times a day (QID) | ORAL | Status: DC | PRN

## 2013-02-21 MED ORDER — ENOXAPARIN SODIUM 80 MG/0.8ML ~~LOC~~ SOLN
80.0000 mg | Freq: Two times a day (BID) | SUBCUTANEOUS | Status: DC
  Administered 2013-02-21 – 2013-02-22 (×3): 80 mg via SUBCUTANEOUS
  Filled 2013-02-21 (×6): qty 0.8

## 2013-02-21 MED ORDER — LORAZEPAM 2 MG/ML IJ SOLN
1.0000 mg | Freq: Once | INTRAMUSCULAR | Status: DC
  Filled 2013-02-21: qty 1

## 2013-02-21 MED ORDER — LEVETIRACETAM 750 MG PO TABS
750.0000 mg | ORAL_TABLET | Freq: Two times a day (BID) | ORAL | Status: DC
  Administered 2013-02-21 – 2013-02-22 (×3): 750 mg via ORAL
  Filled 2013-02-21 (×4): qty 1

## 2013-02-21 MED ORDER — ONDANSETRON HCL 4 MG/2ML IJ SOLN: 4.0000 mg | Freq: Four times a day (QID) | INTRAMUSCULAR | Status: DC | PRN

## 2013-02-21 MED ORDER — SODIUM CHLORIDE 0.9 % IJ SOLN
3.0000 mL | Freq: Two times a day (BID) | INTRAMUSCULAR | Status: DC
  Administered 2013-02-21 – 2013-02-22 (×3): 3 mL via INTRAVENOUS

## 2013-02-21 MED ORDER — LACOSAMIDE 200 MG PO TABS
200.0000 mg | ORAL_TABLET | Freq: Two times a day (BID) | ORAL | Status: DC
  Administered 2013-02-21 – 2013-02-22 (×3): 200 mg via ORAL
  Filled 2013-02-21 (×3): qty 1

## 2013-02-21 MED ORDER — LAMOTRIGINE 200 MG PO TABS
200.0000 mg | ORAL_TABLET | Freq: Two times a day (BID) | ORAL | Status: DC
Start: 2013-02-21 — End: 2013-02-22
  Administered 2013-02-21 – 2013-02-22 (×3): 200 mg via ORAL
  Filled 2013-02-21 (×4): qty 1

## 2013-02-21 MED ORDER — WARFARIN SODIUM 7.5 MG PO TABS
7.5000 mg | ORAL_TABLET | ORAL | Status: DC
  Administered 2013-02-21: 7.5 mg via ORAL
  Filled 2013-02-21: qty 1

## 2013-02-21 MED ORDER — WARFARIN - PHYSICIAN DOSING INPATIENT: Freq: Every day | Status: DC

## 2013-02-21 MED ORDER — LEVOTHYROXINE SODIUM 25 MCG PO TABS
25.0000 ug | ORAL_TABLET | Freq: Every day | ORAL | Status: DC
  Administered 2013-02-21 – 2013-02-22 (×2): 25 ug via ORAL
  Filled 2013-02-21 (×3): qty 1

## 2013-02-21 MED ORDER — LORAZEPAM 2 MG/ML IJ SOLN
INTRAMUSCULAR | Status: AC
  Administered 2013-02-21: 1 mg via INTRAVENOUS
  Filled 2013-02-21: qty 1

## 2013-02-21 MED ORDER — FLUOXETINE HCL 20 MG PO CAPS
60.0000 mg | ORAL_CAPSULE | Freq: Every day | ORAL | Status: DC
  Administered 2013-02-21 – 2013-02-22 (×2): 60 mg via ORAL
  Filled 2013-02-21 (×2): qty 3

## 2013-02-21 MED ORDER — ACETAMINOPHEN 650 MG RE SUPP: 650.0000 mg | Freq: Four times a day (QID) | RECTAL | Status: DC | PRN

## 2013-02-21 MED ORDER — LORAZEPAM 2 MG/ML IJ SOLN
1.0000 mg | INTRAMUSCULAR | Status: DC | PRN
  Administered 2013-02-21 – 2013-02-22 (×4): 1 mg via INTRAVENOUS
  Filled 2013-02-21 (×4): qty 1

## 2013-02-21 MED ORDER — PROMETHAZINE HCL 25 MG PO TABS
25.0000 mg | ORAL_TABLET | Freq: Four times a day (QID) | ORAL | Status: DC | PRN
  Administered 2013-02-22: 25 mg via ORAL
  Filled 2013-02-21: qty 1

## 2013-02-21 MED ORDER — LAMOTRIGINE 200 MG PO TABS
400.0000 mg | ORAL_TABLET | Freq: Two times a day (BID) | ORAL | Status: DC
  Filled 2013-02-21 (×2): qty 2

## 2013-02-21 MED ORDER — FLUOXETINE HCL 20 MG PO CAPS
40.0000 mg | ORAL_CAPSULE | Freq: Every day | ORAL | Status: DC
  Administered 2013-02-21: 40 mg via ORAL
  Filled 2013-02-21 (×2): qty 2

## 2013-02-21 MED ORDER — LORAZEPAM BOLUS VIA INFUSION
1.0000 mg | INTRAVENOUS | Status: DC | PRN
  Filled 2013-02-21: qty 1

## 2013-02-21 MED ORDER — ACETAMINOPHEN 325 MG PO TABS
650.0000 mg | ORAL_TABLET | Freq: Four times a day (QID) | ORAL | Status: DC | PRN
  Administered 2013-02-21: 650 mg via ORAL
  Filled 2013-02-21: qty 2

## 2013-02-21 MED ORDER — WARFARIN SODIUM 10 MG PO TABS
10.0000 mg | ORAL_TABLET | ORAL | Status: DC
  Filled 2013-02-21: qty 1

## 2013-02-21 MED ORDER — LORAZEPAM 2 MG/ML IJ SOLN
1.0000 mg | Freq: Once | INTRAMUSCULAR | Status: AC
  Administered 2013-02-21: 1 mg via INTRAVENOUS

## 2013-02-21 MED ORDER — FERROUS SULFATE 325 (65 FE) MG PO TABS
325.0000 mg | ORAL_TABLET | Freq: Every day | ORAL | Status: DC
  Administered 2013-02-22: 325 mg via ORAL
  Filled 2013-02-21 (×4): qty 1

## 2013-02-21 MED ORDER — ACETAMINOPHEN 325 MG PO TABS
650.0000 mg | ORAL_TABLET | ORAL | Status: DC | PRN
  Administered 2013-02-21 (×2): 650 mg via ORAL
  Filled 2013-02-21 (×2): qty 2

## 2013-02-21 MED ORDER — LEVETIRACETAM 500 MG PO TABS
500.0000 mg | ORAL_TABLET | Freq: Two times a day (BID) | ORAL | Status: DC
  Filled 2013-02-21 (×2): qty 1

## 2013-02-21 NOTE — ED Notes (Signed)
[facility]ed [facility] dispatch to [facility] to [facility]. Awaiting arrival.

## 2013-02-21 NOTE — Consult Note (Signed)
NEURO HOSPITALIST CONSULT NOTE    Reason for Consult: seizures.  HPI:                                                                                                                                          Cassandra Andrade is an 47 y.o. female with a past medical history significant for brain aneurysm repair several years ago complicated by right brain ischemic stroke and structural seizures,  osteosarcoma right leg s/p BKA, MRSA, anxiety, depression, bilateral lower extremity DVTs on Coumadin with subtherapeutic INR, thyroid disease, and video-EEG confirmed mixed type seizure disorder at [facility] last October, transferred to [facility] for further evaluation of recurrent seizures. She initially presented to [facility] ED due to recurrent seizures last night that the family described as " a different type of seizure with facial grimacing, inability to talk, and hitting herself and anybody in her way". Mother said that the seizures lasted for several minutes and she was unresponsive. Last seizure was about 130 am this morning while still in the ED at [facility]. She is on vimpat 200 mg BID, keppra 500 mg BID, and lamotrigine was increased to 800 mg daily at the time of discharged from [facility] few days ago. No recent fever, infection, head trauma, or missing AED doses. Denies HA, vertigo, double vision, difficulty swallowing, slurred speech, language or vision impairment.  Past Medical History  Diagnosis Date  . Anxiety   . Depression   . Seizures   . CVA (cerebrovascular accident due to intracerebral hemorrhage) 2004  . Arthritis   . MRSA (methicillin resistant Staphylococcus aureus)   . S/P BKA (below knee amputation) unilateral     Right   . Thyroid disease   . Cancer     Osteosarcoma, right leg    Past Surgical History  Procedure Laterality Date  . Fracture surgery      Rt. femur, folllowed by MRSA  . Brain surgery  2004    cerebral aneurysm  . Total knee arthroplasty Right   . Above knee leg  amputation Right     Osteosarcoma    Family History  Problem Relation Age of Onset  . Anxiety disorder Mother   . Diabetes Father     Social History:  reports that she has quit smoking. She has never used smokeless tobacco. She reports that she does not drink alcohol or use illicit drugs.  Allergies  Allergen Reactions  . Vicodin [Hydrocodone-Acetaminophen] Nausea And Vomiting  . Morphine And Related Rash    Allergic reaction only in iv form    MEDICATIONS:  I have reviewed the patient's current medications.   ROS:                                                                                                                                       History obtained from the patient, family, and chart review.  General ROS: negative for - chills, fatigue, fever, night sweats, weight gain or weight loss Psychological ROS: negative for - behavioral disorder, hallucinations. Complains of short term memory loss. Ophthalmic ROS: negative for - blurry vision, double vision, eye pain or loss of vision ENT ROS: negative for - epistaxis, nasal discharge, oral lesions, sore throat, tinnitus or vertigo Allergy and Immunology ROS: negative for - hives or itchy/watery eyes Hematological and Lymphatic ROS: negative for - bleeding problems, bruising or swollen lymph nodes Endocrine ROS: negative for - galactorrhea, hair pattern changes, polydipsia/polyuria or temperature intolerance Respiratory ROS: negative for - cough, hemoptysis, shortness of breath or wheezing Cardiovascular ROS: negative for - chest pain, dyspnea on exertion, edema or irregular heartbeat Gastrointestinal ROS: negative for - abdominal pain, diarrhea, hematemesis, nausea/vomiting or stool incontinence Genito-Urinary ROS: negative for - dysuria, hematuria, incontinence or urinary frequency/urgency Musculoskeletal  ROS: negative for - joint swelling Neurological ROS: as noted in HPI Dermatological ROS: negative for rash and skin lesion changes   Physical exam: pleasant female in no apparent distress. Blood pressure 114/78, pulse 86, temperature 98.2 F (36.8 C), temperature source Oral, resp. rate 20, height 5\' 7"  (1.702 m), weight 76.567 kg (168 lb 12.8 oz), last menstrual period 02/09/2013, SpO2 100.00%. Head: normocephalic. Neck: supple, no bruits, no JVD. Cardiac: no murmurs. Lungs: clear. Abdomen: soft, no tender, no mass. Extremities: right BKA  Neurologic Examination:                                                                                                      Mental Status: Alert, oriented, thought content appropriate.  Speech fluent without evidence of aphasia.  Able to follow 3 step commands without difficulty. Cranial Nerves: II: Discs flat bilaterally; Left eye is externally deviated compared to right(motehr states baseline),  Visual fields impaired in the left, pupils asymmetrical but reactive to light III,IV, VI: ptosis not present, extra-ocular motions intact bilaterally V,VII: smile asymmetric with mild left face weakness, facial light touch sensation normal bilaterally VIII: hearing normal bilaterally IX,X: gag reflex present XI: bilateral shoulder shrug XII: midline tongue extension without atrophy or fasciculations Motor: Significant for mild left sided weakness. Sensory: Pinprick and light  touch intact throughout, bilaterally Deep Tendon Reflexes:  Normal and symmetrical in upper extremities as well as normal lower extremity on the left. S/p right BKA. Plantars: Right: s/p BKA   Left: downgoing Cerebellar: normal finger-to-nose,  heel-to-shin test no able to test. Gait:  No tested.   No results found for this basename: cbc, bmp, coags, chol, tri, ldl, hga1c    Results for orders placed during the hospital encounter of 02/21/13 (from the past 48 hour(s))   GLUCOSE, CAPILLARY     Status: None   Collection Time    02/21/13 12:16 AM      Result Value Range   Glucose-Capillary 83  70 - 99 mg/dL  PROTIME-INR     Status: Abnormal   Collection Time    02/21/13 12:49 AM      Result Value Range   Prothrombin Time 16.7 (*) 11.6 - 15.2 seconds   INR 1.39  0.00 - 1.49  CBC     Status: Abnormal   Collection Time    02/21/13 12:49 AM      Result Value Range   WBC 2.9 (*) 4.0 - 10.5 K/uL   RBC 4.17  3.87 - 5.11 MIL/uL   Hemoglobin 11.3 (*) 12.0 - 15.0 g/dL   HCT 16.1  09.6 - 04.5 %   MCV 87.3  78.0 - 100.0 fL   MCH 27.1  26.0 - 34.0 pg   MCHC 31.0  30.0 - 36.0 g/dL   RDW 40.9  81.1 - 91.4 %   Platelets 192  150 - 400 K/uL  POCT I-STAT, CHEM 8     Status: Abnormal   Collection Time    02/21/13 12:58 AM      Result Value Range   Sodium 142  137 - 147 mEq/L   Potassium 3.8  3.7 - 5.3 mEq/L   Chloride 101  96 - 112 mEq/L   BUN 15  6 - 23 mg/dL   Creatinine, Ser 7.82  0.50 - 1.10 mg/dL   Glucose, Bld 89  70 - 99 mg/dL   Calcium, Ion 9.56 (*) 1.12 - 1.23 mmol/L   TCO2 28  0 - 100 mmol/L   Hemoglobin 13.6  12.0 - 15.0 g/dL   HCT 21.3  08.6 - 57.8 %    Ct Head Wo Contrast  02/21/2013   CLINICAL DATA:  Grand mal seizures tonight. CVA 2004. History of osteosarcoma right lower leg.  EXAM: CT HEAD WITHOUT CONTRAST  TECHNIQUE: Contiguous axial images were obtained from the base of the skull through the vertex without intravenous contrast.  COMPARISON:  02/17/2013  FINDINGS: Postoperative changes with right frontoparietal craniotomy and right temporal craniectomy. Underlying encephalomalacia in the right frontoparietal temporal region. Appearance is stable since previous study. Right subdural thickening may be postoperative. Changes are also stable. There is dilatation of the right lateral ventricle consistent with ex vacuo dilatation. No developing mass effect or midline shift. No abnormal extra-axial fluid collections are otherwise identified. No  acute intracranial hemorrhage. Visualized paranasal sinuses and mastoid air cells are not opacified.  IMPRESSION: Stable chronic postoperative changes and right cerebral encephalomalacia. No acute intracranial abnormalities are identified.   Electronically Signed   By: Burman Nieves M.D.   On: 02/21/2013 00:42   Assessment/Plan: 47 y.o. female with a past medical history significant for brain aneurysm repair several years ago complicated by right brain ischemic stroke and structural seizures, video-EEG confirmed mixed type seizure disorder ( both non epileptic and epileptic seizures), admitted after  sustaining a cluster of seizures at home last night. Family said that she had " a different type of seizure last night" but now is back to baseline. It is hard to determine based exclusively on clinical description whether or not she had non epileptic or real epileptic seizures last night and this morning. In any case, I will suggest increasing the dose of keppra to 750 mg BID and going back to her prior dose of Lamictal 400 mg daily. Will not pursue EEG at this moment, but she probably will require repeat video-EEG monitoring at an [facility] center. Patient is back to baseline and thus no other neurological intervention at this time.  Wyatt Portela, MD 02/21/2013, 7:56 AM [facility].

## 2013-02-21 NOTE — Progress Notes (Signed)
Prolonged EEG initiated at 1155.

## 2013-02-21 NOTE — ED Notes (Signed)
Bed: WU98 Expected date:  Expected time:  Means of arrival:  Comments: EMS, Possible Seizure

## 2013-02-21 NOTE — Progress Notes (Signed)
At 1054 RN called to room pt waving hands back and forth with eyes squeezed shut and BLE jerking. 1 mg ativan given. Pt able to follow commands, burps and says "excuse me I am so sorry" and answers questions appropriately. Pt then put arms down and held them rigid while BLE were jerking and kept repeating "I can't make it stop". Pt then started ripping her tele leads and clothes off. Attempted to pull out IV. Attempted to get out of bed numerous times stating she was going home. Mittens applied for safety. MD notified. Pt started to hit head and hit hand on railing. Pt instructed to stop. Pt stopped. EEG ordered. Pt quit jerking at 1140 when EEG tech entered the room.

## 2013-02-21 NOTE — ED Notes (Signed)
[facility] in ED to transport pt [facility].

## 2013-02-21 NOTE — Progress Notes (Signed)
[facility] PROGRESS NOTE  Cassandra Andrade NFA:[id number] DOB: 1965-10-29 DOA: 02/21/2013 PCP: Joneen Roach, NP   Cassandra Andrade is a 47 y.o. female, with past medical history significant for post CVA and intracranial hemorrhage seizure disorder, who was discharged from [facility] on 12/28 after her medications were changed to Keppra, Lamictal, and vimpat , brought by her family today because of seizure activity. Her seizures were described as hand movements around her head nonpurposeful during which the patient did not lose consciousness and according to her maintained her consciousness. No headaches, chest pains or shortness of breath was reported. The patient had an episode in the ER as well that lasted for around one hour   Assessment/Plan:  Mixed type seizure disorder  Active seizure in the ED at the time of admission  Neurology consulted.  Medications adjusted.  Dr. Leroy Kennedy feels she will require additional Video EEG monitoring at an academic center (likely [place] where she has been before).  Patient currently stable.  Will monitor overnight and likely be able to discharge home tomorrow if she remains stable.  Family and patient request that all 4 bed rails be raised to reduce her fall risk.   Will check U/A.  U/A on 12/27 grew 15k colonies of e-coli.  Want to ensure a UTI would not have set off a seizure.   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 Quiet and stable.  Status post CVA and intracerebral bleed status post aneurysmal clipping in 2004. Quiet and stable.      DVT Prophylaxis:  lovenox / coumadin  Code Status: full Family Communication: with mother and family at bedside. Disposition Plan: home with mother likely 02/22/13   Consultants:  Neuro  Procedures:    Antibiotics:    HPI/Subjective: Patient  complaining of menstrual cramps and back pain.  Objective: Filed Vitals:   02/21/13 0322 02/21/13 0535 02/21/13 0536 02/21/13 0700  BP:  108/82  114/78  Pulse:  83  86  Temp: 98.6 F (37 C)  99 F (37.2 C) 98.2 F (36.8 C)  TempSrc: Oral  Oral Oral  Resp:  22  20  Height:    5\' 7"  (1.702 m)  Weight:    76.567 kg (168 lb 12.8 oz)  SpO2:  98%  100%    Intake/Output Summary (Last 24 hours) at 02/21/13 1024 Last data filed at 02/21/13 4696  Gross per 24 hour  Intake      0 ml  Output    100 ml  Net   -100 ml   Filed Weights   02/21/13 0700  Weight: 76.567 kg (168 lb 12.8 oz)    Exam: General: Well developed, well nourished, NAD, appears stated age  HEENT:  PERR, EOMI, Anicteic Sclera, MMM. No pharyngeal erythema or exudates.  Slight nystagmus. Neck: Supple, no JVD, no masses  Cardiovascular: RRR, S1 S2 auscultated, no rubs, murmurs or gallops.   Respiratory: Clear to auscultation bilaterally with equal chest rise  Abdomen: Soft, nontender, nondistended, + bowel sounds  Extremities: warm dry without cyanosis clubbing or edema. Right AKA.  Well healed. Neuro: AAOx3, cranial nerves grossly intact. Strength 5/5 in upper and lower extremities  Skin: Without rashes exudates or nodules.      Data Reviewed: Basic Metabolic Panel:  Recent Labs Lab 02/17/13 0313 02/21/13 0058 02/21/13 0805  NA 138 142  --  K 4.1 3.8  --   CL 100 101  --   CO2 28  --   --   GLUCOSE 83 89  --   BUN 15 15  --   CREATININE 0.88 1.10 0.90  CALCIUM 9.2  --   --    Liver Function Tests:  Recent Labs Lab 02/17/13 0313  AST 26  ALT 33  ALKPHOS 105  BILITOT 0.2*  PROT 7.7  ALBUMIN 3.8   CBC:  Recent Labs Lab 02/17/13 0200 02/21/13 0049 02/21/13 0058 02/21/13 0805  WBC 3.8* 2.9*  --  4.0  HGB 12.9 11.3* 13.6 11.8*  HCT 40.6 36.4 40.0 37.7  MCV 88.1 87.3  --  88.1  PLT 187 192  --  209   BNP (last 3 results)  Recent Labs  11/04/12 1227  PROBNP 74.6   CBG:  Recent  Labs Lab 02/21/13 0016  GLUCAP 83    Recent Results (from the past 240 hour(s))  URINE CULTURE     Status: None   Collection Time    02/17/13  5:44 AM      Result Value Range Status   Specimen Description URINE, CATHETERIZED   Final   Special Requests NONE   Final   Culture  Setup Time     Final   Value: 02/17/2013 14:18     Performed at [facility] Count     Final   Value: 15,000 COLONIES/ML     Performed at [facility]   Culture     Final   Value: ESCHERICHIA COLI     Performed at [facility]   Report Status 02/19/2013 FINAL   Final   Organism ID, Bacteria ESCHERICHIA COLI   Final     Studies: Ct Head Wo Contrast  02/21/2013   CLINICAL DATA:  Grand mal seizures tonight. CVA 2004. History of osteosarcoma right lower leg.  EXAM: CT HEAD WITHOUT CONTRAST  TECHNIQUE: Contiguous axial images were obtained from the base of the skull through the vertex without intravenous contrast.  COMPARISON:  02/17/2013  FINDINGS: Postoperative changes with right frontoparietal craniotomy and right temporal craniectomy. Underlying encephalomalacia in the right frontoparietal temporal region. Appearance is stable since previous study. Right subdural thickening may be postoperative. Changes are also stable. There is dilatation of the right lateral ventricle consistent with ex vacuo dilatation. No developing mass effect or midline shift. No abnormal extra-axial fluid collections are otherwise identified. No acute intracranial hemorrhage. Visualized paranasal sinuses and mastoid air cells are not opacified.  IMPRESSION: Stable chronic postoperative changes and right cerebral encephalomalacia. No acute intracranial abnormalities are identified.   Electronically Signed   By: Burman Nieves M.D.   On: 02/21/2013 00:42    Scheduled Meds: . enoxaparin (LOVENOX) injection  80 mg Subcutaneous Q12H  . ferrous sulfate  325 mg Oral Q breakfast  . FLUoxetine  40 mg Oral QHS  .  FLUoxetine  60 mg Oral Daily  . lacosamide  200 mg Oral BID  . lamoTRIgine  200 mg Oral BID  . levETIRAcetam  750 mg Oral BID  . levothyroxine  25 mcg Oral QAC breakfast  . LORazepam  1 mg Intravenous Once  . sodium chloride  3 mL Intravenous Q12H  . [START ON 02/22/2013] warfarin  10 mg Oral Custom  . warfarin  7.5 mg Oral Custom  . Warfarin - Physician Dosing Inpatient   Does not apply q1800   Continuous Infusions:   Principal  Problem:   Seizure Active Problems:   DVT, bilateral lower limbs   Seizures    Conley Canal  [facility] Pager [phone]. If 7PM-7AM, please contact night-coverage at [email or web address], password [facility] 02/21/2013, 10:24 AM  LOS: 0 days

## 2013-02-21 NOTE — ED Provider Notes (Signed)
CSN: [id number]     Arrival date & time 02/20/13  2356 History   First MD Initiated Contact with Patient 02/21/13 0022     Chief Complaint  Patient presents with  . Seizures   (Consider location/radiation/quality/duration/timing/severity/associated sxs/prior Treatment) The history is limited by the condition of the patient.   History provided by family bedside. Patient has history of seizure disorder/ previous intracranial hemorrhage, DVT, right BKA history of osteosarcoma.  She was discharged from [facility] 2 days ago for recurrent seizures. At that time she did have increase in her seizure medications. She is prescribed Keppra, Lamictal and Vimpat.  Tonight at home she was sleeping and woke with altered mental status and violent outbursts, swelling and family which was very alarming to them. She has never had a post ictal state that before and did not have witnessed seizure at that time either. Brought in by EMS. No missed medications. Has been sleeping a lot the last 2 days without sleep deprivation. No fevers, vomiting or recent illness otherwise. Patient appears to be post ictal, is not providing any history at this time and level V caveat applies Past Medical History  Diagnosis Date  . Anxiety   . Depression   . Seizures   . CVA (cerebrovascular accident due to intracerebral hemorrhage) 2004  . Arthritis   . MRSA (methicillin resistant Staphylococcus aureus)   . S/P BKA (below knee amputation) unilateral     Right   . Thyroid disease   . Cancer     Osteosarcoma, right leg   Past Surgical History  Procedure Laterality Date  . Fracture surgery      Rt. femur, folllowed by MRSA  . Brain surgery  2004    cerebral aneurysm  . Total knee arthroplasty Right   . Above knee leg amputation Right     Osteosarcoma   Family History  Problem Relation Age of Onset  . Anxiety disorder Mother   . Diabetes Father    History  Substance Use Topics  . Smoking status: Former  Games developer  . Smokeless tobacco: Never Used  . Alcohol Use: No   OB History   Grav Para Term Preterm Abortions TAB SAB Ect Mult Living                 Review of Systems  Unable to perform ROS  level V caveat applies  Allergies  Vicodin and Morphine and related  Home Medications   Current Outpatient Rx  Name  Route  Sig  Dispense  Refill  . Ascorbic Acid (VITAMIN C) 1000 MG tablet      TAKE 1 TABLET BY MOUTH EVERY DAY         . Calcium Carbonate-Vitamin D 600-400 MG-UNIT per tablet   Oral   Take 1 tablet by mouth daily.          . ferrous sulfate 325 (65 FE) MG tablet   Oral   Take 325 mg by mouth daily with breakfast.         . FLUoxetine (PROZAC) 20 MG capsule   Oral   Take 60 mg by mouth daily.         [name] FLUoxetine (PROZAC) 40 MG capsule   Oral   Take 40 mg by mouth at bedtime.          . Iron-FA-B Cmp-C-Biot-Probiotic (FUSION PLUS) CAPS      1 capsule.         . lacosamide (VIMPAT) 200 MG  TABS tablet   Oral   Take 1 tablet (200 mg total) by mouth 2 (two) times daily.   60 tablet   5   . lamoTRIgine (LAMICTAL) 200 MG tablet   Oral   Take 400 mg by mouth 2 (two) times daily.          [name] levETIRAcetam (KEPPRA) 500 MG tablet   Oral   Take 1 tablet (500 mg total) by mouth 2 (two) times daily.   60 tablet   0   . levothyroxine (SYNTHROID, LEVOTHROID) 25 MCG tablet   Oral   Take 25 mcg by mouth daily.         . promethazine (PHENERGAN) 25 MG tablet   Oral   Take 25 mg by mouth every 6 (six) hours as needed. nausea         . warfarin (COUMADIN) 5 MG tablet   Oral   Take 7.5-10 mg by mouth daily. Takes 7.5mg  on Monday, Wednesday, Friday and Saturday Takes 10mg  on Sunday, Tuesday, and Thursday          BP 161/146  Pulse 88  Temp(Src) 98.6 F (37 C) (Oral)  Resp 20  SpO2 100%  LMP 02/09/2013 Physical Exam  Constitutional: She appears well-developed and well-nourished.  HENT:  Head: Normocephalic and atraumatic.  Eyes: EOM are  normal. Pupils are equal, round, and reactive to light.  Neck: Neck supple.  Cardiovascular: Normal rate, regular rhythm and intact distal pulses.   Pulmonary/Chest: Effort normal. No respiratory distress.  Musculoskeletal: She exhibits no edema.  Right BKA  Neurological:  Nonverbal, does not follow commands  Skin: Skin is warm and dry.    ED Course  Procedures (including critical care time) Labs Review Labs Reviewed  PROTIME-INR - Abnormal; Notable for the following:    Prothrombin Time 16.7 (*)    All other components within normal limits  CBC - Abnormal; Notable for the following:    WBC 2.9 (*)    Hemoglobin 11.3 (*)    All other components within normal limits  POCT I-STAT, CHEM 8 - Abnormal; Notable for the following:    Calcium, Ion 1.24 (*)    All other components within normal limits  GLUCOSE, CAPILLARY   Imaging Review Ct Head Wo Contrast  02/21/2013   CLINICAL DATA:  Grand mal seizures tonight. CVA 2004. History of osteosarcoma right lower leg.  EXAM: CT HEAD WITHOUT CONTRAST  TECHNIQUE: Contiguous axial images were obtained from the base of the skull through the vertex without intravenous contrast.  COMPARISON:  02/17/2013  FINDINGS: Postoperative changes with right frontoparietal craniotomy and right temporal craniectomy. Underlying encephalomalacia in the right frontoparietal temporal region. Appearance is stable since previous study. Right subdural thickening may be postoperative. Changes are also stable. There is dilatation of the right lateral ventricle consistent with ex vacuo dilatation. No developing mass effect or midline shift. No abnormal extra-axial fluid collections are otherwise identified. No acute intracranial hemorrhage. Visualized paranasal sinuses and mastoid air cells are not opacified.  IMPRESSION: Stable chronic postoperative changes and right cerebral encephalomalacia. No acute intracranial abnormalities are identified.   Electronically Signed   By:  Burman Nieves M.D.   On: 02/21/2013 00:42    EKG Interpretation    Date/Time:  Wednesday February 21 2013 00:06:31 EST Ventricular Rate:  85 PR Interval:  215 QRS Duration: 81 QT Interval:  376 QTC Calculation: 447 R Axis:   54 Text Interpretation:  Sinus rhythm Prolonged PR interval Nonspecific ST abnormality No  significant change since last tracing Confirmed by Saxton Chain  MD, Trinka Keshishyan [phone]) on 02/21/2013 12:34:58 AM            2 witnessed seizures in the ED requiring IV Ativan, followed by periods of persistent postictal state.  4:03 AM discussed with neurology on call Dr. Roseanne Reno. He recommends MED admit at [facility]. Hospitalist consulted.    Patient is now waking up and answering questions, mild confusion   MED admit MDM  Dx: persistent seizures  CT head, labs obtained/ reviewed Medications provided NEU consult and MED admit    Sunnie Nielsen, MD 02/21/13 [phone]

## 2013-02-21 NOTE — H&P (Signed)
[facility]                                                                                    Patient Demographics  Cassandra Andrade, is a 47 y.o. female  CSN: [id number]  MRN: [id number]  DOB - Apr 11, 1965  Admit Date - 02/21/2013  Outpatient Primary MD for the patient is Joneen Roach, NP   With History of -  Past Medical History  Diagnosis Date  . Anxiety   . Depression   . Seizures   . CVA (cerebrovascular accident due to intracerebral hemorrhage) 2004  . Arthritis   . MRSA (methicillin resistant Staphylococcus aureus)   . S/P BKA (below knee amputation) unilateral     Right   . Thyroid disease   . Cancer     Osteosarcoma, right leg      Past Surgical History  Procedure Laterality Date  . Fracture surgery      Rt. femur, folllowed by MRSA  . Brain surgery  2004    cerebral aneurysm  . Total knee arthroplasty Right   . Above knee leg amputation Right     Osteosarcoma    in for   Chief Complaint  Patient presents with  . Seizures     HPI  Cassandra Andrade  is a 47 y.o. female, with past medical history significant for post CVA and intracranial hemorrhage seizure disorder, who was discharged from [facility] on Sunday after her medications were changed to Keppra, Lamictal, and vimpat , brought by her family today because of seizure activity. Her seizures were described as hand movements around her head nonpurposeful during which the patient did not lose consciousness and according to her maintained her consciousness. No headaches, chest pains or shortness of breath was reported. The patient had an episode in the ER as well that lasted for around one hour    Review of Systems    In addition to the HPI above,  No Fever-chills, No Headache, No changes with Vision or hearing, No problems swallowing food or Liquids, No Chest pain, Cough or Shortness of Breath, No Abdominal pain, No Nausea or Vommitting, Bowel movements are regular, No  Blood in stool or Urine, No dysuria, No new skin rashes or bruises, No new joints pains-aches,  No new weakness, tingling, numbness in any extremity, No recent weight gain or loss, No polyuria, polydypsia or polyphagia,   A full 10 point Review of Systems was done, except as stated above, all other Review of Systems were negative.   Social History History  Substance Use Topics  . Smoking status: Former Games developer  . Smokeless tobacco: Never Used  . Alcohol Use: No     Family History Family History  Problem Relation Age of Onset  . Anxiety disorder Mother   . Diabetes Father      Prior to Admission medications   Medication Sig Start Date End Date Taking? Authorizing Provider  Ascorbic Acid (VITAMIN C) 1000 MG tablet TAKE 1 TABLET BY MOUTH EVERY DAY 08/17/11  Yes Historical Provider, MD  Calcium Carbonate-Vitamin D 600-400 MG-UNIT per tablet Take 1 tablet by mouth daily.    Yes  Historical Provider, MD  ferrous sulfate 325 (65 FE) MG tablet Take 325 mg by mouth daily with breakfast.   Yes Historical Provider, MD  FLUoxetine (PROZAC) 20 MG capsule Take 60 mg by mouth daily.   Yes Historical Provider, MD  FLUoxetine (PROZAC) 40 MG capsule Take 40 mg by mouth at bedtime.  01/01/13  Yes Cleotis Nipper, MD  Iron-FA-B Cmp-C-Biot-Probiotic (FUSION PLUS) CAPS 1 capsule.   Yes Historical Provider, MD  lacosamide (VIMPAT) 200 MG TABS tablet Take 1 tablet (200 mg total) by mouth 2 (two) times daily. 01/08/13  Yes Nilda Riggs, NP  lamoTRIgine (LAMICTAL) 200 MG tablet Take 400 mg by mouth 2 (two) times daily.    Yes Historical Provider, MD  levETIRAcetam (KEPPRA) 500 MG tablet Take 1 tablet (500 mg total) by mouth 2 (two) times daily. 02/18/13  Yes Adeline C Viyuoh, MD  levothyroxine (SYNTHROID, LEVOTHROID) 25 MCG tablet Take 25 mcg by mouth daily.   Yes Historical Provider, MD  promethazine (PHENERGAN) 25 MG tablet Take 25 mg by mouth every 6 (six) hours as needed. nausea   Yes  Historical Provider, MD  warfarin (COUMADIN) 5 MG tablet Take 7.5-10 mg by mouth daily. Takes 7.5mg  on Monday, Wednesday, Friday and Saturday Takes 10mg  on Sunday, Tuesday, and Thursday   Yes Historical Provider, MD    Allergies  Allergen Reactions  . Vicodin [Hydrocodone-Acetaminophen] Nausea And Vomiting  . Morphine And Related Rash    Allergic reaction only in iv form    Physical Exam  Vitals  Blood pressure 126/76, pulse 85, temperature 98.6 F (37 C), temperature source Oral, resp. rate 19, last menstrual period 02/09/2013, SpO2 100.00%.   1. General African American female, lying in bed in no acute distress  2. Normal affect and insight, Not Suicidal or Homicidal, Awake Alert, Oriented X 3.  3. No F.N deficits, ALL C.Nerves Intact, Strength 5/5 all 4 extremities, , Plantars down going, reflexes symmetrical and normal bilaterally.  4. Ears and Eyes appear Normal, Conjunctivae clear, PERRLA. Moist Oral Mucosa.  5. Supple Neck, No JVD, No cervical lymphadenopathy appriciated, No Carotid Bruits.  6. Symmetrical Chest wall movement, Good air movement bilaterally, CTAB.  7. RRR, No Gallops, Rubs or Murmurs, No Parasternal Heave.  8. Positive Bowel Sounds, Abdomen Soft, Non tender, No organomegaly appriciated,No rebound -guarding or rigidity.  9.  No Cyanosis, Normal Skin Turgor, No Skin Rash or Bruise.  10. Good muscle tone,  joints appear normal , no effusions, Normal ROM, status post right above-knee amputation.  11. No Palpable Lymph Nodes in Neck or Axillae    Data Review  CBC  Recent Labs Lab 02/17/13 0200 02/21/13 0049 02/21/13 0058  WBC 3.8* 2.9*  --   HGB 12.9 11.3* 13.6  HCT 40.6 36.4 40.0  PLT 187 192  --   MCV 88.1 87.3  --   MCH 28.0 27.1  --   MCHC 31.8 31.0  --   RDW 14.9 15.2  --    ------------------------------------------------------------------------------------------------------------------  Chemistries   Recent Labs Lab  02/17/13 0313 02/21/13 0058  NA 138 142  K 4.1 3.8  CL 100 101  CO2 28  --   GLUCOSE 83 89  BUN 15 15  CREATININE 0.88 1.10  CALCIUM 9.2  --   AST 26  --   ALT 33  --   ALKPHOS 105  --   BILITOT 0.2*  --    ------------------------------------------------------------------------------------------------------------------   Coagulation profile  Recent Labs Lab 02/17/13  4098 02/18/13 0350 02/21/13 0049  INR 1.86* 1.80* 1.39   -------------------------------------------------------------------------------------------------------------------  ---------------------------------------------------------------------------------------------------------------  Imaging results:   Ct Head Wo Contrast  02/21/2013   CLINICAL DATA:  Grand mal seizures tonight. CVA 2004. History of osteosarcoma right lower leg.  EXAM: CT HEAD WITHOUT CONTRAST  TECHNIQUE: Contiguous axial images were obtained from the base of the skull through the vertex without intravenous contrast.  COMPARISON:  02/17/2013  FINDINGS: Postoperative changes with right frontoparietal craniotomy and right temporal craniectomy. Underlying encephalomalacia in the right frontoparietal temporal region. Appearance is stable since previous study. Right subdural thickening may be postoperative. Changes are also stable. There is dilatation of the right lateral ventricle consistent with ex vacuo dilatation. No developing mass effect or midline shift. No abnormal extra-axial fluid collections are otherwise identified. No acute intracranial hemorrhage. Visualized paranasal sinuses and mastoid air cells are not opacified.  IMPRESSION: Stable chronic postoperative changes and right cerebral encephalomalacia. No acute intracranial abnormalities are identified.   Electronically Signed   By: Burman Nieves M.D.   On: 02/21/2013 00:42   Ct Head Wo Contrast  02/17/2013   CLINICAL DATA:  Seizures.  EXAM: CT HEAD WITHOUT CONTRAST  TECHNIQUE:  Contiguous axial images were obtained from the base of the skull through the vertex without intravenous contrast.  COMPARISON:  None.  FINDINGS: There is no evidence of acute infarction, mass lesion, or intra- or extra-axial hemorrhage on CT.  Chronic encephalomalacia is noted at the right temporal and parietal lobes, with postoperative change at the right parietal and frontal lobes, and associated ex vacuo dilatation of the right lateral ventricle. This involves part of the right basal ganglia. There is mild rightward midline shift, reflecting right-sided volume loss.  The posterior fossa, including the cerebellum, brainstem and fourth ventricle, is within normal limits. Apparent chronic right-sided subdural thickening is noted, underlying the patient's craniotomy flap and craniectomy defect.  There is no evidence of fracture; visualized osseous structures are unremarkable in appearance. The visualized portions of the orbits are within normal limits. The paranasal sinuses and mastoid air cells are well-aerated. No significant soft tissue abnormalities are seen.  IMPRESSION: 1. No acute intracranial pathology seen on CT. 2. Chronic right-sided postoperative changes seen, with right-sided volume loss and encephalomalacia. Apparent chronic right-sided subdural thickening noted.   Electronically Signed   By: Roanna Raider M.D.   On: 02/17/2013 03:28   Dg Chest Portable 1 View  02/17/2013   CLINICAL DATA:  Seizure.  EXAM: PORTABLE CHEST - 1 VIEW  COMPARISON:  None.  FINDINGS: The lungs are hypoexpanded but appear grossly clear. There is no evidence of focal opacification, pleural effusion or pneumothorax.  The cardiomediastinal silhouette is borderline normal in size. No acute osseous abnormalities are seen.  IMPRESSION: Lungs hypoexpanded but grossly clear.   Electronically Signed   By: Roanna Raider M.D.   On: 02/17/2013 04:25      Assessment & Plan  1. resistant seizures; patient on Keppra, Lamictal and  Vimpat . Family reports that they were giving her the medications. She now lives with her mom. 2. Bilateral lower extremity DVTs on Coumadin with subtherapeutic INR 3. Right leg osteosarcoma status post above-knee amputation and Mersa infection of the stump treated 4. Status post CVA and intracerebral bleed status post aneurysmal clipping in 2004.  Plan We'll continue her medications Transferred to [facility] telemetry When necessary Ativan Consult neurology Therapeutic dose of Lovenox   DVT Prophylaxis Lovenox/Coumadin  AM Labs Ordered, also please review Full Orders  Family Communication: Admission, patients condition and plan of care including tests being ordered have been discussed with the patient and mother who indicate understanding and agree with the plan and Code Status.  Code Status full  Disposition Plan: Home  Time spent in minute: 42 minutes  Condition GUARDED

## 2013-02-22 ENCOUNTER — Encounter (HOSPITAL_COMMUNITY): Payer: Self-pay | Admitting: General Practice

## 2013-02-22 DIAGNOSIS — F329 Major depressive disorder, single episode, unspecified: Secondary | ICD-10-CM

## 2013-02-22 DIAGNOSIS — F3289 Other specified depressive episodes: Secondary | ICD-10-CM

## 2013-02-22 LAB — BASIC METABOLIC PANEL
BUN: 13 mg/dL (ref 6–23)
CO2: 23 mEq/L (ref 19–32)
Calcium: 8.8 mg/dL (ref 8.4–10.5)
Chloride: 104 mEq/L (ref 96–112)
Creatinine, Ser: 0.91 mg/dL (ref 0.50–1.10)
GFR calc Af Amer: 86 mL/min — ABNORMAL LOW (ref >90)
GFR calc non Af Amer: 75 mL/min — ABNORMAL LOW (ref >90)
Glucose, Bld: 119 mg/dL — ABNORMAL HIGH (ref 70–99)
Potassium: 3.9 mEq/L (ref 3.7–5.3)
Sodium: 142 mEq/L (ref 137–147)

## 2013-02-22 LAB — CBC
HCT: 37.5 % (ref 36.0–46.0)
Hemoglobin: 11.9 g/dL — ABNORMAL LOW (ref 12.0–15.0)
MCH: 27.6 pg (ref 26.0–34.0)
MCHC: 31.7 g/dL (ref 30.0–36.0)
MCV: 87 fL (ref 78.0–100.0)
Platelets: 220 10*3/uL (ref 150–400)
RBC: 4.31 MIL/uL (ref 3.87–5.11)
RDW: 15.2 % (ref 11.5–15.5)
WBC: 2.9 10*3/uL — ABNORMAL LOW (ref 4.0–10.5)

## 2013-02-22 LAB — PROTIME-INR
INR: 1.61 — ABNORMAL HIGH (ref 0.00–1.49)
Prothrombin Time: 18.7 seconds — ABNORMAL HIGH (ref 11.6–15.2)

## 2013-02-22 MED ORDER — ENOXAPARIN SODIUM 80 MG/0.8ML ~~LOC~~ SOLN: 80.0000 mg | Freq: Two times a day (BID) | SUBCUTANEOUS | Status: DC

## 2013-02-22 NOTE — Progress Notes (Signed)
At 1205 RN was called by sitter into the room.  Pt. Was gently moving her hand and left leg.  When asked what was going on, she stated "I'm having an episode, very little."  Episode lasted 10 minutes.  1mg  of Ativan was given.  During episode she spoke and was able to follow commands.  After episode finished she continued her conversation with the sitter.  Will continue to monitor patient.  Call light placed within reach.

## 2013-02-22 NOTE — Progress Notes (Signed)
NEURO HOSPITALIST PROGRESS NOTE   SUBJECTIVE:                                                                                                                        Eating breakfast. Looks very comfortable. Nursing notes indicate that around 1 am this morning she had a seizure that lasted 10 min and she was given 1 ,g IV ativan. Yesterday morning she had recurrent paroxysmal events lasting for more than 1 hour, characterized by purposely, random movements arms and head but without alteration of consciousness or inability to communicate. The " seizures" ceased by the time she was hook up to continuous EEG. Review of EEG data showed no electrographic seizures. On Keppra 750 mg BID, Vimpat 400 mg daily, and Lamictal  200 mg BID.  OBJECTIVE:                                                                                                                           Vital signs in last 24 hours: Temp:  [97.6 F (36.4 C)-98.8 F (37.1 C)] 98.8 F (37.1 C) (01/01 0532) Pulse Rate:  [72-96] 91 (01/01 0532) Resp:  [16-20] 16 (01/01 0532) BP: (95-145)/(56-86) 100/61 mmHg (01/01 0532) SpO2:  [96 %-100 %] 100 % (01/01 0532)  Intake/Output from previous day:   Intake/Output this shift:   Nutritional status: Cardiac  Past Medical History  Diagnosis Date  . Anxiety   . Depression   . Seizures   . CVA (cerebrovascular accident due to intracerebral hemorrhage) 2004  . Arthritis   . MRSA (methicillin resistant Staphylococcus aureus)   . S/P BKA (below knee amputation) unilateral     Right   . Thyroid disease   . Cancer     Osteosarcoma, right leg    Neurologic Exam:  Mental Status:  Alert, oriented, thought content appropriate. Speech fluent without evidence of aphasia. Able to follow 3 step commands without difficulty.  Cranial Nerves:  II: Discs flat bilaterally; Left eye is externally deviated compared to right(motehr states baseline),  Visual fields  impaired in the left, pupils asymmetrical but reactive to light  III,IV, VI: ptosis not present, extra-ocular motions intact bilaterally  V,VII: smile asymmetric with mild left face weakness, facial light touch  sensation normal bilaterally  VIII: hearing normal bilaterally  IX,X: gag reflex present  XI: bilateral shoulder shrug  XII: midline tongue extension without atrophy or fasciculations  Motor:  Significant for mild left sided weakness.  Sensory: Pinprick and light touch intact throughout, bilaterally  Deep Tendon Reflexes:  Normal and symmetrical in upper extremities as well as normal lower extremity on the left.  S/p right BKA.  Plantars:  Right: s/p BKA Left: downgoing  Cerebellar:  normal finger-to-nose, heel-to-shin test no able to test.  Gait:  No tested.   Lab Results: No results found for this basename: cbc, bmp, coags, chol, tri, ldl, hga1c   Lipid Panel No results found for this basename: CHOL, TRIG, HDL, CHOLHDL, VLDL, LDLCALC,  in the last 72 hours  Studies/Results: Ct Head Wo Contrast  02/21/2013   CLINICAL DATA:  Grand mal seizures tonight. CVA 2004. History of osteosarcoma right lower leg.  EXAM: CT HEAD WITHOUT CONTRAST  TECHNIQUE: Contiguous axial images were obtained from the base of the skull through the vertex without intravenous contrast.  COMPARISON:  02/17/2013  FINDINGS: Postoperative changes with right frontoparietal craniotomy and right temporal craniectomy. Underlying encephalomalacia in the right frontoparietal temporal region. Appearance is stable since previous study. Right subdural thickening may be postoperative. Changes are also stable. There is dilatation of the right lateral ventricle consistent with ex vacuo dilatation. No developing mass effect or midline shift. No abnormal extra-axial fluid collections are otherwise identified. No acute intracranial hemorrhage. Visualized paranasal sinuses and mastoid air cells are not opacified.  IMPRESSION:  Stable chronic postoperative changes and right cerebral encephalomalacia. No acute intracranial abnormalities are identified.   Electronically Signed   By: Lucienne Capers M.D.   On: 02/21/2013 00:42    MEDICATIONS                                                                                                                       I have reviewed the patient's current medications.  ASSESSMENT/PLAN:                                                                                                           48 y/o with video-EEG confirmed mixed type seizure disorder last October at [facility], admitted due to " new seizure type". The paroxysmal events that she had had so far occurred when she was off continuous EEG monitoring but the semiology of such events was highly concerning for non epileptic seizures. Will continue current AEDS at the same dosages. No further neurological intervention required  as inpatient. Needs follow up by psychiatry and neurology to assess non epileptic and epileptic seizures. Will sign off. Please, call neurology with any questions or concerns.  Dorian Pod, MD [facility] [phone]  02/22/2013, 9:15 AM

## 2013-02-22 NOTE — Progress Notes (Signed)
Pt. dc'd home via car with mother.  DC instructions and prescriptions given to patient and mother.  Pt. Assessments and vital signs were stable.

## 2013-02-22 NOTE — Discharge Summary (Signed)
Physician Discharge Summary  Cassandra Andrade [id number] DOB: May 04, 1965 DOA: 02/21/2013  PCP: Paulino Rily, NP  Admit date: 02/21/2013 Discharge date: 02/22/2013  Time spent: 35 minutes  Recommendations for Outpatient Follow-up:  1. Follow up with neuro/psych  Discharge Diagnoses:  Principal Problem:   Seizure Active Problems:   DVT, bilateral lower limbs   Seizures   Discharge Condition: improved  Diet recommendation: regular  Filed Weights   02/21/13 0700  Weight: 76.567 kg (168 lb 12.8 oz)    History of present illness:  Cassandra Andrade is a 48 y.o. female, with past medical history significant for post CVA and intracranial hemorrhage seizure disorder, who was discharged from [facility] on Sunday after her medications were changed to [place], Lamictal, and vimpat , brought by her family today because of seizure activity. Her seizures were described as hand movements around her head nonpurposeful during which the patient did not lose consciousness and according to her maintained her consciousness. No headaches, chest pains or shortness of breath was reported. The patient had an episode in the ER as well that lasted for around one hour   Hospital Course:  48 y/o with video-EEG confirmed mixed type seizure disorder last October at [facility], admitted due to " new seizure type". The paroxysmal events that she had had so far occurred when she was off continuous EEG monitoring but the semiology of such events was highly concerning for non epileptic seizures.  Will continue current AEDS at the same dosages. No further neurological intervention required as inpatient.  Needs follow up by psychiatry and neurology to assess non epileptic and epileptic seizures  DVT b/l- lovenox bridge to INR goal of 2-3  Updated mom at length  Procedures:  eeg  Consultations:  neuro  Discharge Exam: Filed Vitals:   02/22/13 0532  BP: 100/61  Pulse: 91  Temp: 98.8  F (37.1 C)  Resp: 16    General: pleasant/cooperative Cardiovascular: rrr Respiratory: clear anterior  Discharge Instructions      Discharge Orders   Future Appointments Provider Department Dept Phone   03/05/2013 12:00 PM Kathrynn Ducking, MD [facility] [phone]   03/26/2013 3:00 PM Darlyn Chamber, [place] [phone]   07/09/2013 3:00 PM Dennie Bible, NP [facility] [phone]   Future Orders Complete By Expires   Diet general  As directed    Discharge instructions  As directed    Comments:     lovenox overlapping with coumadin for 3 days PT/INR- Friday and again on Monday   Increase activity slowly  As directed        Medication List         Calcium Carbonate-Vitamin D 600-400 MG-UNIT per tablet  Take 1 tablet by mouth daily.     enoxaparin 80 MG/0.8ML injection  Commonly known as:  LOVENOX  Inject 0.8 mLs (80 mg total) into the skin every 12 (twelve) hours.     ferrous sulfate 325 (65 FE) MG tablet  Take 325 mg by mouth daily with breakfast.     FLUoxetine 40 MG capsule  Commonly known as:  PROZAC  Take 40 mg by mouth at bedtime.     FLUoxetine 20 MG capsule  Commonly known as:  PROZAC  Take 60 mg by mouth daily.     FUSION PLUS Caps  1 capsule.     lacosamide 200 MG Tabs tablet  Commonly known as:  VIMPAT  Take 1 tablet (  200 mg total) by mouth 2 (two) times daily.     lamoTRIgine 200 MG tablet  Commonly known as:  LAMICTAL  Take 400 mg by mouth 2 (two) times daily.     levETIRAcetam 500 MG tablet  Commonly known as:  KEPPRA  Take 1 tablet (500 mg total) by mouth 2 (two) times daily.     levothyroxine 25 MCG tablet  Commonly known as:  SYNTHROID, LEVOTHROID  Take 25 mcg by mouth daily.     promethazine 25 MG tablet  Commonly known as:  PHENERGAN  Take 25 mg by mouth every 6 (six) hours as needed. nausea     vitamin C 1000 MG tablet  TAKE 1 TABLET  BY MOUTH EVERY DAY     warfarin 5 MG tablet  Commonly known as:  COUMADIN  - Take 7.5-10 mg by mouth daily. Takes 7.5mg  on Monday, Wednesday, Friday and Saturday  - Takes 10mg  on Sunday, Tuesday, and Thursday       Allergies  Allergen Reactions  . Vicodin [Hydrocodone-Acetaminophen] Nausea And Vomiting  . Morphine And Related Rash    Allergic reaction only in iv form   Follow-up Information   Follow up with Paulino Rily, NP In 1 day. (INR tomm)    Specialty:  Nurse Practitioner   Contact information:   [place] [address] [phone]       Please follow up. (psychiatric doctor who she is already estabilished with in 1 week)       Follow up with Lenor Coffin, MD In 1 week.   Specialty:  Neurology   Contact information:   [address] [place] [address] [phone]        The results of significant diagnostics from this hospitalization (including imaging, microbiology, ancillary and laboratory) are listed below for reference.    Significant Diagnostic Studies: Ct Head Wo Contrast  02/21/2013   CLINICAL DATA:  Grand mal seizures tonight. CVA 2004. History of osteosarcoma right lower leg.  EXAM: CT HEAD WITHOUT CONTRAST  TECHNIQUE: Contiguous axial images were obtained from the base of the skull through the vertex without intravenous contrast.  COMPARISON:  02/17/2013  FINDINGS: Postoperative changes with right frontoparietal craniotomy and right temporal craniectomy. Underlying encephalomalacia in the right frontoparietal temporal region. Appearance is stable since previous study. Right subdural thickening may be postoperative. Changes are also stable. There is dilatation of the right lateral ventricle consistent with ex vacuo dilatation. No developing mass effect or midline shift. No abnormal extra-axial fluid collections are otherwise identified. No acute intracranial hemorrhage. Visualized paranasal sinuses and  mastoid air cells are not opacified.  IMPRESSION: Stable chronic postoperative changes and right cerebral encephalomalacia. No acute intracranial abnormalities are identified.   Electronically Signed   By: Lucienne Capers M.D.   On: 02/21/2013 00:42   Ct Head Wo Contrast  02/17/2013   CLINICAL DATA:  Seizures.  EXAM: CT HEAD WITHOUT CONTRAST  TECHNIQUE: Contiguous axial images were obtained from the base of the skull through the vertex without intravenous contrast.  COMPARISON:  None.  FINDINGS: There is no evidence of acute infarction, mass lesion, or intra- or extra-axial hemorrhage on CT.  Chronic encephalomalacia is noted at the right temporal and parietal lobes, with postoperative change at the right parietal and frontal lobes, and associated ex vacuo dilatation of the right lateral ventricle. This involves part of the right basal ganglia. There is mild rightward midline shift, reflecting right-sided volume loss.  The posterior fossa, including the cerebellum, brainstem and fourth ventricle, is within normal limits. Apparent chronic right-sided subdural thickening is noted, underlying the patient's craniotomy flap and craniectomy defect.  There is no evidence of fracture; visualized osseous structures are unremarkable in appearance. The visualized portions of the orbits are within normal limits. The paranasal sinuses and mastoid air cells are well-aerated. No significant soft tissue abnormalities are seen.  IMPRESSION: 1. No acute intracranial pathology seen on CT. 2. Chronic right-sided postoperative changes seen, with right-sided volume loss and encephalomalacia. Apparent chronic right-sided subdural thickening noted.   Electronically Signed   By: Garald Balding M.D.   On: 02/17/2013 03:28   Dg Chest Portable 1 View  02/17/2013   CLINICAL DATA:  Seizure.  EXAM: PORTABLE CHEST - 1 VIEW  COMPARISON:  None.  FINDINGS: The lungs are hypoexpanded but appear grossly clear. There is no evidence of focal  opacification, pleural effusion or pneumothorax.  The cardiomediastinal silhouette is borderline normal in size. No acute osseous abnormalities are seen.  IMPRESSION: Lungs hypoexpanded but grossly clear.   Electronically Signed   By: Garald Balding M.D.   On: 02/17/2013 04:25    Microbiology: Recent Results (from the past 240 hour(s))  URINE CULTURE     Status: None   Collection Time    02/17/13  5:44 AM      Result Value Range Status   Specimen Description URINE, CATHETERIZED   Final   Special Requests NONE   Final   Culture  Setup Time     Final   Value: 02/17/2013 14:18     Performed at [place]     Final   Value: 15,000 COLONIES/ML     Performed at [facility]   Culture     Final   Value: ESCHERICHIA COLI     Performed at [facility]   Report Status 02/19/2013 FINAL   Final   Organism ID, Bacteria ESCHERICHIA COLI   Final     Labs: Basic Metabolic Panel:  Recent Labs Lab 02/17/13 0313 02/21/13 0058 02/21/13 0805 02/22/13 0455  NA 138 142  --  142  K 4.1 3.8  --  3.9  CL 100 101  --  104  CO2 28  --   --  23  GLUCOSE 83 89  --  119*  BUN 15 15  --  13  CREATININE 0.88 1.10 0.90 0.91  CALCIUM 9.2  --   --  8.8   Liver Function Tests:  Recent Labs Lab 02/17/13 0313  AST 26  ALT 33  ALKPHOS 105  BILITOT 0.2*  PROT 7.7  ALBUMIN 3.8   No results found for this basename: LIPASE, AMYLASE,  in the last 168 hours No results found for this basename: AMMONIA,  in the last 168 hours CBC:  Recent Labs Lab 02/17/13 0200 02/21/13 0049 02/21/13 0058 02/21/13 0805 02/22/13 0455  WBC 3.8* 2.9*  --  4.0 2.9*  HGB 12.9 11.3* 13.6 11.8* 11.9*  HCT 40.6 36.4 40.0 37.7 37.5  MCV 88.1 87.3  --  88.1 87.0  PLT 187 192  --  209 220   Cardiac Enzymes: No results found for this basename: CKTOTAL, CKMB, CKMBINDEX, TROPONINI,  in the last 168 hours BNP: BNP (last 3 results)  Recent Labs  11/04/12 1227  PROBNP 74.6    CBG:  Recent Labs Lab 02/21/13 0016  GLUCAP 83       Signed:  Jaxston Chohan  [facility] 02/22/2013, 2:59 PM

## 2013-02-22 NOTE — Progress Notes (Signed)
Pt bed alarm was set off and when arrived in room pt was found to have seizure like activity that lasted 10 min. Pt was given 1mg  IV Ativan as ordered. Pt was alert and able to respond and answer question appropriately. Arthor Captain LPN

## 2013-02-27 ENCOUNTER — Ambulatory Visit (HOSPITAL_COMMUNITY): Payer: Self-pay | Admitting: Licensed Clinical Social Worker

## 2013-02-28 ENCOUNTER — Encounter: Payer: Self-pay | Admitting: Neurology

## 2013-02-28 ENCOUNTER — Ambulatory Visit (INDEPENDENT_AMBULATORY_CARE_PROVIDER_SITE_OTHER): Payer: Medicare Other | Admitting: Neurology

## 2013-02-28 VITALS — BP 100/68 | HR 72

## 2013-02-28 DIAGNOSIS — G40909 Epilepsy, unspecified, not intractable, without status epilepticus: Secondary | ICD-10-CM

## 2013-02-28 DIAGNOSIS — D649 Anemia, unspecified: Secondary | ICD-10-CM | POA: Insufficient documentation

## 2013-02-28 DIAGNOSIS — G40919 Epilepsy, unspecified, intractable, without status epilepticus: Secondary | ICD-10-CM | POA: Insufficient documentation

## 2013-02-28 HISTORY — DX: Epilepsy, unspecified, intractable, without status epilepticus: G40.919

## 2013-02-28 MED ORDER — LEVETIRACETAM 750 MG PO TABS: 750.0000 mg | ORAL_TABLET | Freq: Two times a day (BID) | ORAL | Status: DC

## 2013-02-28 NOTE — Patient Instructions (Signed)
Epilepsy A seizure (convulsion) is a sudden change in brain function that causes a change in behavior, muscle activity, or ability to remain awake and alert. If a person has recurring seizures, this is called epilepsy. CAUSES  Epilepsy is a disorder with many possible causes. Anything that disturbs the normal pattern of brain cell activity can lead to seizures. Seizure can be caused from illness to brain damage to abnormal brain development. Epilepsy may develop because of:  An abnormality in brain wiring.  An imbalance of nerve signaling chemicals (neurotransmitters).  Some combination of these factors. Scientists are learning an increasing amount about genetic causes of seizures. SYMPTOMS  The symptoms of a seizure can vary greatly from one person to another. These may include:  An aura, or warning that tells a person they are about to have a seizure.  Abnormal sensations, such as abnormal smell or seeing flashing lights.  Sudden, general body stiffness.  Rhythmic jerking of the face, arm, or leg  on one or both sides.  Sudden change in consciousness.  The person may appear to be awake but not responding.  They may appear to be asleep but cannot be awakened.  Grimacing, chewing, lip smacking, or drooling.  Often there is a period of sleepiness after a seizure. DIAGNOSIS  The description you give to your caregiver about what you experienced will help them understand your problems. Equally important is the description by any witnesses to your seizure. A physical exam, including a detailed neurological exam, is necessary. An EEG (electroencephalogram) is a painless test of your brain waves. In this test a diagram is created of your brain waves. These diagrams can be interpreted by a specialist. Pictures of your brain are usually taken with:  An MRI.  A CT scan. Lab tests may be done to look for:  Signs of infection.  Abnormal blood chemistry. PREVENTION  There is no way to  prevent the development of epilepsy. If you have seizures that are typically triggered by an event (such as flashing lights), try to avoid the trigger. This can help you avoid a seizure.  PROGNOSIS  Most people with epilepsy lead outwardly normal lives. While epilepsy cannot currently be cured, for some people it does eventually go away. Most seizures do not cause brain damage. It is not uncommon for people with epilepsy, especially children, to develop behavioral and emotional problems. These problems are sometimes the consequence of medicine for seizures or social stress. For some people with epilepsy, the risk of seizures restricts their independence and recreational activities. For example, some states refuse drivers licenses to people with epilepsy. Most women with epilepsy can become pregnant. They should discuss their epilepsy and the medicine they are taking with their caregivers. Women with epilepsy have a 86 percent or better chance of having a normal, healthy baby. RISKS AND COMPLICATIONS  People with epilepsy are at increased risk of falls, accidents, and injuries. People with epilepsy are at special risk for two life-threatening conditions. These are status epilepticus and sudden unexplained death (extremely rare). Status epilepticus is a long lasting, continuous seizure that is a medical emergency. TREATMENT  Once epilepsy is diagnosed, it is important to begin treatment as soon as possible. For about 80 percent of those diagnosed with epilepsy, seizures can be controlled with modern medicines and surgical techniques. Some antiepileptic drugs can interfere with the effectiveness of oral contraceptives. In 1997, the FDA approved a pacemaker for the brain the (vagus nerve stimulator). This stimulator can be used for  people with seizures that are not well-controlled by medicine. Studies have shown that in some cases, children may experience fewer seizures if they maintain a strict diet. The strict  diet is called the ketogenic diet. This diet is rich in fats and low in carbohydrates. HOME CARE INSTRUCTIONS   Your caregiver will make recommendations about driving and safety in normal activities. Follow these carefully.  Take any medicine prescribed exactly as directed.  Do any blood tests requested to monitor the levels of your medicine.  The people you live and work with should know that you are prone to seizures. They should receive instructions on how to help you. In general, a witness to a seizure should:  Cushion your head and body.  Turn you on your side.  Avoid unnecessarily restraining you.  Not place anything inside your mouth.  Call for local emergency medical help if there is any question about what has occurred.  Keep a seizure diary. Record what you recall about any seizure, especially any possible trigger.  If your caregiver has given you a follow-up appointment, it is very important to keep that appointment. Not keeping the appointment could result in permanent injury and disability. If there is any problem keeping the appointment, you must call back to this facility for assistance. SEEK MEDICAL CARE IF:   You develop signs of infection or other illness. This might increase the risk of a seizure.  You seem to be having more frequent seizures.  Your seizure pattern is changing. SEEK IMMEDIATE MEDICAL CARE IF:   A seizure does not stop after a few moments.  A seizure causes any difficulty in breathing.  A seizure results in a very severe headache.  A seizure leaves you with the inability to speak or use a part of your body. MAKE SURE YOU:   Understand these instructions.  Will watch your condition.  Will get help right away if you are not doing well or get worse. Document Released: 02/08/2005 Document Revised: 05/03/2011 Document Reviewed: 09/20/2012 [facility] Patient Information 2014 [place].

## 2013-02-28 NOTE — Progress Notes (Signed)
Reason for visit: Seizures  Cassandra Andrade is an 48 y.o. female  History of present illness:  Cassandra Andrade is a 48 year old right-handed black female with a history of intractable seizures. The patient has a history of a cerebral aneurysm and a right brain stroke. The patient has been on Lamictal and Vimpat, but the seizures have not been well controlled. The patient has had video EEG monitoring that has confirmed the presence of subclinical seizures as well as clinical seizures without EEG correlates. It is felt that these events are stereotypical, and likely represent focal seizures without EEG correlates. The patient has been in the hospital on 2 occasions in December, once on December 27, and again on December 31. The patient has been placed on Keppra, currently on 500 mg twice daily. The patient is having daily or almost daily seizure events. The patient may have episodes of agitation, and slurred speech. The patient is having events that are longer in duration than usual, sometimes lasting 30-60 minutes. The patient is eating well. The patient is nonambulatory, getting around in a wheelchair. The patient returns to this office for an evaluation. Recent Lamictal levels were 13.5.  Past Medical History  Diagnosis Date  . Anxiety   . Depression   . Seizures   . CVA (cerebrovascular accident due to intracerebral hemorrhage) 2004  . Arthritis   . MRSA (methicillin resistant Staphylococcus aureus)   . S/P BKA (below knee amputation) unilateral     Right   . Thyroid disease   . Cancer     Osteosarcoma, right leg  . 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 DVT (deep venous thrombosis)     Left leg    Past Surgical History  Procedure Laterality Date  . Fracture surgery      Rt. femur, folllowed by MRSA  . Brain surgery  2004    cerebral aneurysm  . Total knee arthroplasty Right   . Above knee leg amputation Right     Osteosarcoma    Family History  Problem  Relation Age of Onset  . Anxiety disorder Mother   . Diabetes Father     Social history:  reports that she has quit smoking. She has never used smokeless tobacco. She reports that she does not drink alcohol or use illicit drugs.    Allergies  Allergen Reactions  . Vicodin [Hydrocodone-Acetaminophen] Nausea And Vomiting  . Morphine And Related Rash    Allergic reaction only in iv form    Medications:  Current Outpatient Prescriptions on File Prior to Visit  Medication Sig Dispense Refill  . Ascorbic Acid (VITAMIN C) 1000 MG tablet TAKE 1 TABLET BY MOUTH EVERY DAY      . Calcium Carbonate-Vitamin D 600-400 MG-UNIT per tablet Take 1 tablet by mouth daily.       [name] enoxaparin (LOVENOX) 80 MG/0.8ML injection Inject 0.8 mLs (80 mg total) into the skin every 12 (twelve) hours.  6 Syringe  0  . ferrous sulfate 325 (65 FE) MG tablet Take 325 mg by mouth daily with breakfast.      . FLUoxetine (PROZAC) 20 MG capsule Take 60 mg by mouth daily.      [name] FLUoxetine (PROZAC) 40 MG capsule Take 40 mg by mouth at bedtime.       . Iron-FA-B Cmp-C-Biot-Probiotic (FUSION PLUS) CAPS 1 capsule.      . lacosamide (VIMPAT) 200 MG TABS tablet Take 1 tablet (200 mg total) by mouth 2 (two) times daily.  60 tablet  5  . lamoTRIgine (LAMICTAL) 200 MG tablet Take 400 mg by mouth 2 (two) times daily.       [name] levothyroxine (SYNTHROID, LEVOTHROID) 25 MCG tablet Take 25 mcg by mouth daily.      . promethazine (PHENERGAN) 25 MG tablet Take 25 mg by mouth every 6 (six) hours as needed. nausea      . warfarin (COUMADIN) 5 MG tablet Take 7.5-10 mg by mouth daily. Takes 7.5mg  on Monday, Wednesday, Friday and Saturday Takes 10mg  on Sunday, Tuesday, and Thursday       No current facility-administered medications on file prior to visit.    ROS:  Out of a complete 14 system review of symptoms, the patient complains only of the following symptoms, and all other reviewed systems are negative.  Seizures Urinary  urgency Speech difficulty Agitation, confusion, depression, suicidal thoughts  Blood pressure 100/68, pulse 72, weight 0 lb (0 kg), last menstrual period 02/09/2013.  Physical Exam  General: The patient is alert and cooperative at the time of the examination.  Skin: No significant peripheral edema is noted.   Neurologic Exam  Mental status: The patient is oriented x 3.  Cranial nerves: Facial symmetry is present. Speech is normal, no aphasia or dysarthria is noted. Extraocular movements are notable for restriction of superior gaze. On primary gaze, there is exotropia of the left eye. Visual fields are full.  Motor: The patient has good strength in all 4 extremities.  Sensory examination: Soft touch sensation on the legs, arms, and face is symmetric  Coordination: The patient has good finger-nose-finger and heel-to-shin bilaterally.  Gait and station: The patient has a  Right above-knee amputation, no prosthesis. The patient could not be ambulated.  Reflexes: Deep tendon reflexes are symmetric in the arms, cannot get reflexes on the right leg secondary to above-knee amputation.   Assessment/Plan:  1. History of cerebral aneurysm, right brain stroke  2. Intractable epilepsy  The patient will be increased on the Keppra taking 750 mg twice daily. If the seizures continue, the patient may need to be considered for a possible vagal nerve stimulator. The patient will followup through this office in 4 months. We will try to maximize the Keppra if the seizures continue.  Jill Alexanders MD 02/28/2013 9:50 PM  [facility] [address] [place], [place] [address]  Phone [phone] Fax [phone]

## 2013-03-02 ENCOUNTER — Telehealth: Payer: Self-pay | Admitting: Hematology & Oncology

## 2013-03-02 NOTE — Telephone Encounter (Signed)
Received referral for pt , she is are pt. There were no in basket orders sent from oct-14 appointment. Dr. Marin Olp and pt aware of 1-21 CT to be npo 4 hrs and drink contrast at 8 and 9 am. She is also aware of 2-3 MD appointment.

## 2013-03-05 ENCOUNTER — Ambulatory Visit: Payer: Self-pay | Admitting: Neurology

## 2013-03-14 ENCOUNTER — Ambulatory Visit (HOSPITAL_COMMUNITY)
Admission: RE | Admit: 2013-03-14 | Discharge: 2013-03-14 | Disposition: A | Discharge status: Home / Self Care | Payer: Medicare Other | Source: Ambulatory Visit | Attending: Hematology & Oncology | Admitting: Hematology & Oncology

## 2013-03-14 ENCOUNTER — Encounter (HOSPITAL_COMMUNITY): Payer: Self-pay

## 2013-03-14 DIAGNOSIS — R918 Other nonspecific abnormal finding of lung field: Secondary | ICD-10-CM | POA: Diagnosis not present

## 2013-03-14 DIAGNOSIS — M799 Soft tissue disorder, unspecified: Secondary | ICD-10-CM | POA: Diagnosis not present

## 2013-03-14 DIAGNOSIS — C4020 Malignant neoplasm of long bones of unspecified lower limb: Secondary | ICD-10-CM | POA: Diagnosis present

## 2013-03-14 DIAGNOSIS — C419 Malignant neoplasm of bone and articular cartilage, unspecified: Secondary | ICD-10-CM

## 2013-03-14 DIAGNOSIS — I82403 Acute embolism and thrombosis of unspecified deep veins of lower extremity, bilateral: Secondary | ICD-10-CM

## 2013-03-14 DIAGNOSIS — K8020 Calculus of gallbladder without cholecystitis without obstruction: Secondary | ICD-10-CM | POA: Diagnosis not present

## 2013-03-14 DIAGNOSIS — K769 Liver disease, unspecified: Secondary | ICD-10-CM | POA: Diagnosis not present

## 2013-03-14 MED ORDER — IOHEXOL 300 MG/ML  SOLN
100.0000 mL | Freq: Once | INTRAMUSCULAR | Status: AC | PRN
  Administered 2013-03-14: 100 mL via INTRAVENOUS

## 2013-03-15 ENCOUNTER — Encounter: Payer: Self-pay | Admitting: Nurse Practitioner

## 2013-03-26 ENCOUNTER — Ambulatory Visit (INDEPENDENT_AMBULATORY_CARE_PROVIDER_SITE_OTHER): Payer: Medicare Other | Admitting: Licensed Clinical Social Worker

## 2013-03-26 ENCOUNTER — Other Ambulatory Visit: Payer: Self-pay | Admitting: Oncology

## 2013-03-26 DIAGNOSIS — F32A Depression, unspecified: Secondary | ICD-10-CM

## 2013-03-26 DIAGNOSIS — F329 Major depressive disorder, single episode, unspecified: Secondary | ICD-10-CM

## 2013-03-26 DIAGNOSIS — F3289 Other specified depressive episodes: Secondary | ICD-10-CM

## 2013-03-26 NOTE — Progress Notes (Signed)
   THERAPIST PROGRESS NOTE  Session Time: 3:00pm-3:50pm  Participation Level: Active  Behavioral Response: Fairly GroomedDrowsy and LethargicAnxious  Type of Therapy: Family Therapy  Treatment Goals addressed: Coping  Interventions: Motivational Interviewing, Strength-based and Supportive  Summary: Cassandra Andrade is a 48 y.o. female who presents with anxious mood and affect. She attends with her mother today and reports tension between the two due to the need for her to walk. She believes she is walking more than her mother reports. Patient demonstrates difficulty with her memory and focus and mother reports worsening memory. Patient had a mild seizure during the session and mother reports these occur once per day.  Strongly encouraged mother to contact patients neurologist. Informed patient of this writers departure and discussed transition plans.   Suicidal/Homicidal: Nowithout intent/plan  Therapist Response: Assessed patients current functioning and reviewed progress. Reviewed coping strategies. Assessed patients safety and assisted in identifying protective factors.  Reviewed crisis plan with patient. Assisted patient with the expression of frustration over her memory. Reviewed patients self care plan. Assessed progress related to self care. Patients self care is fair. Recommend daily exercise, increased socialization and recreation. Used CBT to assist patient with the identification of negative distortions and irrational thoughts. Encouraged patient to verbalize alternative and factual responses which challenge thought distortions. Used motivational interviewing to assist and encourage patient through the change process. Explored patients barriers to change.   Plan: Follow up with [facility]..  Diagnosis: Axis I: Depressive Disorder NOS    Axis II: No diagnosis    Madaleine Simmon, LCSW 03/26/2013

## 2013-03-27 ENCOUNTER — Encounter: Payer: Self-pay | Admitting: Hematology & Oncology

## 2013-03-27 ENCOUNTER — Other Ambulatory Visit (HOSPITAL_BASED_OUTPATIENT_CLINIC_OR_DEPARTMENT_OTHER): Payer: Medicare Other | Admitting: Lab

## 2013-03-27 ENCOUNTER — Ambulatory Visit (HOSPITAL_BASED_OUTPATIENT_CLINIC_OR_DEPARTMENT_OTHER): Payer: Medicare Other | Admitting: Hematology & Oncology

## 2013-03-27 VITALS — BP 116/72 | HR 90 | Temp 98.6°F | Resp 14 | Ht 67.0 in | Wt 173.0 lb

## 2013-03-27 DIAGNOSIS — I82409 Acute embolism and thrombosis of unspecified deep veins of unspecified lower extremity: Secondary | ICD-10-CM

## 2013-03-27 DIAGNOSIS — D6859 Other primary thrombophilia: Secondary | ICD-10-CM

## 2013-03-27 DIAGNOSIS — D509 Iron deficiency anemia, unspecified: Secondary | ICD-10-CM

## 2013-03-27 DIAGNOSIS — I82403 Acute embolism and thrombosis of unspecified deep veins of lower extremity, bilateral: Secondary | ICD-10-CM

## 2013-03-27 DIAGNOSIS — D649 Anemia, unspecified: Secondary | ICD-10-CM

## 2013-03-27 LAB — FERRITIN CHCC: FERRITIN: 114 ng/mL (ref 9–269)

## 2013-03-27 LAB — CBC WITH DIFFERENTIAL (CANCER CENTER ONLY)
BASO#: 0 10*3/uL (ref 0.0–0.2)
BASO%: 0.5 % (ref 0.0–2.0)
EOS ABS: 0 10*3/uL (ref 0.0–0.5)
EOS%: 0.9 % (ref 0.0–7.0)
HEMATOCRIT: 39.8 % (ref 34.8–46.6)
HGB: 12.3 g/dL (ref 11.6–15.9)
LYMPH#: 1.2 10*3/uL (ref 0.9–3.3)
LYMPH%: 53.6 % — AB (ref 14.0–48.0)
MCH: 26.9 pg (ref 26.0–34.0)
MCHC: 30.9 g/dL — ABNORMAL LOW (ref 32.0–36.0)
MCV: 87 fL (ref 81–101)
MONO#: 0.4 10*3/uL (ref 0.1–0.9)
MONO%: 16.4 % — AB (ref 0.0–13.0)
NEUT#: 0.6 10*3/uL — ABNORMAL LOW (ref 1.5–6.5)
NEUT%: 28.6 % — AB (ref 39.6–80.0)
PLATELETS: 171 10*3/uL (ref 145–400)
RBC: 4.57 10*6/uL (ref 3.70–5.32)
RDW: 15 % (ref 11.1–15.7)
WBC: 2.2 10*3/uL — ABNORMAL LOW (ref 3.9–10.0)

## 2013-03-27 LAB — D-DIMER, QUANTITATIVE (NOT AT ARMC): D DIMER QUANT: 0.54 ug{FEU}/mL — AB (ref 0.00–0.48)

## 2013-03-27 LAB — RETICULOCYTES (CHCC)
ABS Retic: 23.2 10*3/uL (ref 19.0–186.0)
RBC.: 4.63 MIL/uL (ref 3.87–5.11)
Retic Ct Pct: 0.5 % (ref 0.4–2.3)

## 2013-03-27 LAB — IRON AND TIBC CHCC
%SAT: 22 % (ref 21–57)
Iron: 60 ug/dL (ref 41–142)
TIBC: 266 ug/dL (ref 236–444)
UIBC: 206 ug/dL (ref 120–384)

## 2013-03-27 LAB — PROTHROMBIN TIME
INR: 2.64 — ABNORMAL HIGH (ref <1.50)
Prothrombin Time: 27.5 seconds — ABNORMAL HIGH (ref 11.6–15.2)

## 2013-03-27 LAB — CHCC SATELLITE - SMEAR

## 2013-03-27 NOTE — Progress Notes (Signed)
This office note has been dictated.

## 2013-03-28 ENCOUNTER — Telehealth: Payer: Self-pay | Admitting: Hematology & Oncology

## 2013-03-28 NOTE — Telephone Encounter (Signed)
Pt aware of 2-11 doppler at [facility]

## 2013-03-28 NOTE — Progress Notes (Signed)
CC:   Cassandra Degree, FNP-C  DIAGNOSES: 1. Bilateral lower extremity deep venous thrombosis. 2. Right below-knee amputation for osteogenic sarcoma. 3. Multiple miscarriages. 4. Positive lupus anticoagulant.  CURRENT THERAPY:  Coumadin to maintain INR between 2-3.  INTERIM HISTORY:  Cassandra Andrade comes in for a followup.  We first saw her back in October.  Since then, she has been doing fairly well.  When we first saw her, we did a hypercoagulable workup on her.  This did show she had a positive lupus anticoagulant that was confirmed to be positive on confirmatory testing.  Because of her AVM and seizures, I just do not think that we would be able to get her on one of the newer oral anticoagulants.  As such, she is on Coumadin.  I think she finally needs the Coumadin for a good year.  We did repeat a CT scan on her.  This was done on January 21st.  The CT scan did not show any evidence of recurrent osteogenic sarcoma.  She wants to have some physical therapy.  She wants the prostatic for the right leg.  Apparently, she had seen a physical therapist about a year or so ago.  I am not sure what this was stopped.  Her mom says that she has seizures every day.  She was followed by [facility], who is monitoring her anti-seizure medications.  She has had no cough.  She has had no fever.  She has had no change in bowel or bladder habits.  PHYSICAL EXAMINATION:  General:  This is a fairly well-developed, well- nourished, African American female in no obvious distress.  Vital Signs: Temperature of 98.6, pulse 90, respiratory rate 14, blood pressure 116/72, weight is 173 pounds.  Head and Neck:  Normocephalic, atraumatic skull.  There are no ocular or oral lesions.  There are no palpable cervical or supraclavicular lymph nodes.  Lungs:  Clear bilaterally. Cardiac:  Regular rate and rhythm with normal S1 and S2.  There are no murmurs, rubs, or bruits.  Abdomen:  Soft.  She has good  bowel sounds. There is no palpable abdominal mass.  There is no palpable hepatosplenomegaly.  Back:  No tenderness over the spine, ribs, or hips. Extremities:  Right below-knee-amputation.  She has no obvious swelling in the left leg.  Neurologic:  No focal neurological deficits.  LABORATORY STUDIES:  White cell count is 2.2, hemoglobin 12.3, hematocrit 39.8, platelet count 171.  IMPRESSION:  Cassandra Andrade is a very nice 48 year old African American female.  She has positive lupus anticoagulant.  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  She has had miscarriages.  She has normal anticardiolipin antibodies and normal beta-2 glycoprotein levels.  For now, I will repeat a Doppler of legs.  Again, I am still not sure as to how extensive the DVTs were.  The Doppler that she initially had shows "extensive thrombus bilaterally."  Hopefully, we might be able to get better sense when we repeat the Doppler.  I will plan to see her back in about a month or so.    ______________________________ Volanda Napoleon, M.D. PRE/MEDQ  D:  03/27/2013  T:  03/28/2013  Job:  [id number]

## 2013-04-01 ENCOUNTER — Other Ambulatory Visit (HOSPITAL_COMMUNITY): Payer: Self-pay | Admitting: Psychiatry

## 2013-04-03 ENCOUNTER — Encounter (HOSPITAL_COMMUNITY): Payer: Self-pay | Admitting: Psychiatry

## 2013-04-03 ENCOUNTER — Ambulatory Visit (INDEPENDENT_AMBULATORY_CARE_PROVIDER_SITE_OTHER): Payer: Medicare Other | Admitting: Psychiatry

## 2013-04-03 VITALS — BP 106/78 | HR 75 | Ht 67.0 in | Wt 153.0 lb

## 2013-04-03 DIAGNOSIS — F329 Major depressive disorder, single episode, unspecified: Secondary | ICD-10-CM

## 2013-04-03 DIAGNOSIS — F339 Major depressive disorder, recurrent, unspecified: Secondary | ICD-10-CM

## 2013-04-03 MED ORDER — FLUOXETINE HCL 20 MG PO CAPS: ORAL_CAPSULE | ORAL | Status: DC

## 2013-04-03 NOTE — Progress Notes (Signed)
[facility] [phone] Progress Note  Cassandra Andrade [id number] 48 y.o.  04/03/2013 3:21 PM  Chief Complaint:  I was admitted again for seizures.    History of Present Illness: Cassandra Andrade came for an appointment with her mother.  She missed her last appointment because she was admitted to the hospital again for seizure.  She is taking Lamictal along with Keppra and vimpat.  As per mother she continues to have seizures but overall her depression is much improved.  She is taking Prozac 60 mg.  She denies any side effects.  She continues to have difficulty walking and as per mother she does not use a prosthesis regularly.  However she is more social and active.  Her sleep is better.  She denies any agitation, anger, mood swings or any suicidal thoughts.  She is still very nervous and anxious about her future.  She is not drinking or using any illegal substances.  She is very concerned about her seizure but hoping that it may stop because she is taking 3 seizure medicine.  She has no rash or itching.  She denies any hallucination or any paranoia.  Suicidal Ideation: Yes Plan Formed: No Patient has means to carry out plan: No  Homicidal Ideation: No Plan Formed: No Patient has means to carry out plan: No  Review of Systems  Musculoskeletal: Positive for back pain.  Neurological: Positive for sensory change, focal weakness and seizures.  Psychiatric/Behavioral: Positive for depression and memory loss. Negative for hallucinations and substance abuse. The patient is nervous/anxious.     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  She has hypothyroidism, chronic neuropathy pain , headache and seizure disorder.  She see Dr. [name] for seizures.  Her primary care is Cassandra Andrade at [facility].     Psychosocial history. Patient is born and raised in [place].  She is currently living with her mother .  She has 3 children who are old at age 38, 35 and 60.  Patient is in the process of getting divorced.  She has limited social network.  She was married for more than 20 years until last year husband filed for divorce.  They've been living separated for more than 2 years.  Outpatient Encounter Prescriptions as of 04/03/2013  Medication Sig  . Ascorbic Acid (VITAMIN C) 1000 MG tablet TAKE 1 TABLET BY MOUTH EVERY DAY  . Calcium Carbonate-Vitamin D 600-400 MG-UNIT per tablet Take 1 tablet by mouth daily.   . ferrous sulfate 325 (65 FE) MG tablet Take 325 mg by mouth daily with breakfast.  . FLUoxetine (PROZAC) 20 MG capsule Take 1 in am and 2 at bed time  . Iron-FA-B Cmp-C-Biot-Probiotic (FUSION PLUS) CAPS 1 capsule.  . lacosamide (VIMPAT) 200 MG TABS tablet Take 1 tablet (200 mg total) by mouth 2 (two) times daily.  [name] lamoTRIgine (LAMICTAL) 200 MG tablet Take 400 mg by mouth 2 (two) times daily.   [name] levETIRAcetam (KEPPRA) 750 MG tablet Take 1 tablet (750 mg total) by mouth 2 (two) times daily.  [name] levothyroxine (SYNTHROID, LEVOTHROID) 25 MCG tablet Take 25 mcg by mouth daily.  [name]  promethazine (PHENERGAN) 25 MG tablet Take 25 mg by mouth every 6 (six) hours as needed. nausea  . warfarin (COUMADIN) 5 MG tablet Take 5 mg by mouth every morning. 2 TABS DAILY = 10 MG TOTAL  . [DISCONTINUED] FLUoxetine (PROZAC) 20 MG capsule Take 60 mg by mouth daily.  . [DISCONTINUED] FLUoxetine (PROZAC) 40 MG capsule Take 40 mg by mouth at bedtime. TOTAL OF 60 MG DAILY     Past Psychiatric History/Hospitalization(s): Patient has at least one  psychiatric admission in 2011 due to overdose on her pain medication.  At that time she has a medical conflict and having argument with her husband.  She denies any history of mania psychosis hallucination.  In the past she has given Zoloft Remeron however she do not remember the details.  She remembered taking Lexapro and Abilify but it was stopped because lack of response.   Anxiety: Yes Bipolar Disorder: No Depression: Yes Mania: No Psychosis: No Schizophrenia: No Personality Disorder: No Hospitalization for psychiatric illness: Yes History of Electroconvulsive Shock Therapy: No Prior Suicide Attempts: Yes  Physical Exam: Constitutional:  BP 106/78  Pulse 75  Ht _0  (1.702 m)  Wt 153 lb (69.4 kg)  BMI 23.96 kg/m2  General Appearance: alert, oriented, no acute distress and Cassandra Andrade dressed and fairly groomed.  She is a poor historian and maintained poor eye contact.  Recent Results (from the past 2160 hour(s))  CBC     Status: Abnormal   Collection Time    02/17/13  2:00 AM      Result Value Range   WBC 3.8 (*) 4.0 - 10.5 K/uL   RBC 4.61  3.87 - 5.11 MIL/uL   Hemoglobin 12.9  12.0 - 15.0 g/dL   HCT 40.6  36.0 - 46.0 %   MCV 88.1  78.0 - 100.0 fL   MCH 28.0  26.0 - 34.0 pg   MCHC 31.8  30.0 - 36.0 g/dL   RDW 14.9  11.5 - 15.5 %   Platelets 187  150 - 400 K/uL  COMPREHENSIVE METABOLIC PANEL     Status: Abnormal   Collection Time    02/17/13  3:13 AM      Result Value Range   Sodium 138  135 - 145 mEq/L   Potassium 4.1  3.5 - 5.1 mEq/L   Chloride 100  96 - 112 mEq/L   CO2 28  19 - 32 mEq/L   Glucose, Bld 83  70 - 99 mg/dL   BUN 15  6 - 23 mg/dL   Creatinine, Ser 0.88  0.50 - 1.10 mg/dL   Calcium 9.2  8.4 - 10.5 mg/dL   Total Protein 7.7  6.0 - 8.3 g/dL   Albumin 3.8  3.5 - 5.2 g/dL   AST 26  0 - 37 U/L   ALT 33  0 - 35 U/L   Alkaline Phosphatase 105  39 - 117 U/L   Total Bilirubin 0.2 (*) 0.3 - 1.2 mg/dL   GFR calc non Af Amer 78 (*) >90 mL/min   GFR calc Af Amer  90 (*) >90 mL/min   Comment: (NOTE)     The eGFR has been calculated using the CKD EPI equation.     This calculation has not been validated in all clinical situations.     eGFR's persistently <90 mL/min signify possible Chronic Kidney     Disease.  PROTIME-INR     Status: Abnormal   Collection Time    02/17/13  3:33  AM      Result Value Range   Prothrombin Time 20.9 (*) 11.6 - 15.2 seconds   INR 1.86 (*) 0.00 - 1.49  LAMOTRIGINE LEVEL     Status: None   Collection Time    02/17/13  3:33 AM      Result Value Range   Lamotrigine Lvl 13.5  3.0 - 14.0 ug/mL   Comment: Performed at [place] [facility] ACID, PLASMA     Status: None   Collection Time    02/17/13  3:33 AM      Result Value Range   Lactic Acid, Venous 0.5  0.5 - 2.2 mmol/L  PROLACTIN     Status: None   Collection Time    02/17/13  3:33 AM      Result Value Range   Prolactin 16.8     Comment: (NOTE)         Reference Ranges:                     Female:                       2.1 -  17.1 ng/ml                     Female:   Pregnant          9.7 - 208.5 ng/mL                               Non Pregnant      2.8 -  29.2 ng/mL                               Post Menopausal   1.8 -  20.3 ng/mL                           Performed at [facility]     Status: None   Collection Time    02/17/13  3:38 AM      Result Value Range   ABO/RH(D) O POS     Antibody Screen NEG     Sample Expiration 02/20/2013    URINALYSIS, ROUTINE W REFLEX MICROSCOPIC     Status: Abnormal   Collection Time    02/17/13  5:44 AM      Result Value Range   Color, Urine YELLOW  YELLOW   APPearance CLOUDY (*) CLEAR   Specific Gravity, Urine 1.023  1.005 - 1.030   pH 6.0  5.0 - 8.0   Glucose, UA NEGATIVE  NEGATIVE mg/dL   Hgb urine dipstick MODERATE (*) NEGATIVE   Bilirubin Urine NEGATIVE  NEGATIVE   Ketones, ur NEGATIVE  NEGATIVE mg/dL   Protein, ur NEGATIVE  NEGATIVE mg/dL   Urobilinogen, UA 0.2  0.0 - 1.0 mg/dL    Nitrite NEGATIVE  NEGATIVE   Leukocytes, UA MODERATE (*) NEGATIVE  URINE RAPID DRUG SCREEN (HOSP PERFORMED)     Status: Abnormal   Collection Time    02/17/13  5:44 AM      Result Value Range   Opiates POSITIVE (*) NONE DETECTED   Cocaine NONE DETECTED  NONE DETECTED   Benzodiazepines NONE DETECTED  NONE DETECTED   Amphetamines NONE DETECTED  NONE DETECTED   Tetrahydrocannabinol NONE DETECTED  NONE DETECTED   Barbiturates NONE DETECTED  NONE DETECTED   Comment:            DRUG SCREEN FOR MEDICAL PURPOSES     ONLY.  IF CONFIRMATION IS NEEDED     FOR ANY PURPOSE, NOTIFY LAB     WITHIN 5 DAYS.                LOWEST DETECTABLE LIMITS     FOR URINE DRUG SCREEN     Drug Class       Cutoff (ng/mL)     Amphetamine      1000     Barbiturate      200     Benzodiazepine   903     Tricyclics       009     Opiates          300     Cocaine          300     THC              50  URINE MICROSCOPIC-ADD ON     Status: Abnormal   Collection Time    02/17/13  5:44 AM      Result Value Range   Squamous Epithelial / LPF MANY (*) RARE   WBC, UA 3-6  <3 WBC/hpf   RBC / HPF 3-6  <3 RBC/hpf   Bacteria, UA MANY (*) RARE  URINE CULTURE     Status: None   Collection Time    02/17/13  5:44 AM      Result Value Range   Specimen Description URINE, CATHETERIZED     Special Requests NONE     Culture  Setup Time       Value: 02/17/2013 14:18     Performed at [facility]       Value: 15,000 COLONIES/ML     Performed at [facility]   Culture       Value: [place]     Performed at [facility]   Report Status 02/19/2013 FINAL     Organism ID, Bacteria ESCHERICHIA COLI    ETHANOL     Status: None   Collection Time    02/17/13  6:30 AM      Result Value Range   Alcohol, Ethyl (B) <11  0 - 11 mg/dL   Comment:            LOWEST DETECTABLE LIMIT FOR     SERUM ALCOHOL IS 11 mg/dL     FOR MEDICAL PURPOSES ONLY  TSH     Status: None   Collection  Time    02/17/13  1:40 PM      Result Value Range   TSH 2.417  0.350 - 4.500 uIU/mL   Comment: Performed at [facility]     Status: Abnormal   Collection Time    02/18/13  3:50 AM      Result Value Range   Prothrombin Time 20.4 (*) 11.6 - 15.2 seconds   INR 1.80 (*) 0.00 - 1.49  GLUCOSE, CAPILLARY     Status: None   Collection Time    02/21/13 12:16 AM      Result Value Range   Glucose-Capillary 83  70 - 99 mg/dL  PROTIME-INR     Status: Abnormal   Collection Time    02/21/13 12:49 AM      Result Value Range  Prothrombin Time 16.7 (*) 11.6 - 15.2 seconds   INR 1.39  0.00 - 1.49  CBC     Status: Abnormal   Collection Time    02/21/13 12:49 AM      Result Value Range   WBC 2.9 (*) 4.0 - 10.5 K/uL   RBC 4.17  3.87 - 5.11 MIL/uL   Hemoglobin 11.3 (*) 12.0 - 15.0 g/dL   HCT 36.4  36.0 - 46.0 %   MCV 87.3  78.0 - 100.0 fL   MCH 27.1  26.0 - 34.0 pg   MCHC 31.0  30.0 - 36.0 g/dL   RDW 15.2  11.5 - 15.5 %   Platelets 192  150 - 400 K/uL  POCT I-STAT, CHEM 8     Status: Abnormal   Collection Time    02/21/13 12:58 AM      Result Value Range   Sodium 142  137 - 147 mEq/L   Potassium 3.8  3.7 - 5.3 mEq/L   Chloride 101  96 - 112 mEq/L   BUN 15  6 - 23 mg/dL   Creatinine, Ser 1.10  0.50 - 1.10 mg/dL   Glucose, Bld 89  70 - 99 mg/dL   Calcium, Ion 1.24 (*) 1.12 - 1.23 mmol/L   TCO2 28  0 - 100 mmol/L   Hemoglobin 13.6  12.0 - 15.0 g/dL   HCT 40.0  36.0 - 46.0 %  CBC     Status: Abnormal   Collection Time    02/21/13  8:05 AM      Result Value Range   WBC 4.0  4.0 - 10.5 K/uL   RBC 4.28  3.87 - 5.11 MIL/uL   Hemoglobin 11.8 (*) 12.0 - 15.0 g/dL   HCT 37.7  36.0 - 46.0 %   MCV 88.1  78.0 - 100.0 fL   MCH 27.6  26.0 - 34.0 pg   MCHC 31.3  30.0 - 36.0 g/dL   RDW 15.3  11.5 - 15.5 %   Platelets 209  150 - 400 K/uL  CREATININE, SERUM     Status: Abnormal   Collection Time    02/21/13  8:05 AM      Result Value Range   Creatinine, Ser 0.90  0.50 -  1.10 mg/dL   GFR calc non Af Amer 76 (*) >90 mL/min   GFR calc Af Amer 88 (*) >90 mL/min   Comment: (NOTE)     The eGFR has been calculated using the CKD EPI equation.     This calculation has not been validated in all clinical situations.     eGFR's persistently <90 mL/min signify possible Chronic Kidney     Disease.  URINALYSIS, DIPSTICK ONLY     Status: Abnormal   Collection Time    02/21/13  9:48 PM      Result Value Range   Specific Gravity, Urine 1.030  1.005 - 1.030   pH 6.0  5.0 - 8.0   Glucose, UA NEGATIVE  NEGATIVE mg/dL   Hgb urine dipstick MODERATE (*) NEGATIVE   Bilirubin Urine NEGATIVE  NEGATIVE   Ketones, ur NEGATIVE  NEGATIVE mg/dL   Protein, ur NEGATIVE  NEGATIVE mg/dL   Urobilinogen, UA 0.2  0.0 - 1.0 mg/dL   Nitrite NEGATIVE  NEGATIVE   Leukocytes, UA SMALL (*) NEGATIVE  BASIC METABOLIC PANEL     Status: Abnormal   Collection Time    02/22/13  4:55 AM      Result Value Range  Sodium 142  137 - 147 mEq/L   Comment: Please note change in reference range.   Potassium 3.9  3.7 - 5.3 mEq/L   Comment: Please note change in reference range.   Chloride 104  96 - 112 mEq/L   CO2 23  19 - 32 mEq/L   Glucose, Bld 119 (*) 70 - 99 mg/dL   BUN 13  6 - 23 mg/dL   Creatinine, Ser 0.91  0.50 - 1.10 mg/dL   Calcium 8.8  8.4 - 10.5 mg/dL   GFR calc non Af Amer 75 (*) >90 mL/min   GFR calc Af Amer 86 (*) >90 mL/min   Comment: (NOTE)     The eGFR has been calculated using the CKD EPI equation.     This calculation has not been validated in all clinical situations.     eGFR's persistently <90 mL/min signify possible Chronic Kidney     Disease.  CBC     Status: Abnormal   Collection Time    02/22/13  4:55 AM      Result Value Range   WBC 2.9 (*) 4.0 - 10.5 K/uL   RBC 4.31  3.87 - 5.11 MIL/uL   Hemoglobin 11.9 (*) 12.0 - 15.0 g/dL   HCT 37.5  36.0 - 46.0 %   MCV 87.0  78.0 - 100.0 fL   MCH 27.6  26.0 - 34.0 pg   MCHC 31.7  30.0 - 36.0 g/dL   RDW 15.2  11.5 - 15.5 %    Platelets 220  150 - 400 K/uL  PROTIME-INR     Status: Abnormal   Collection Time    02/22/13  4:55 AM      Result Value Range   Prothrombin Time 18.7 (*) 11.6 - 15.2 seconds   INR 1.61 (*) 0.00 - 1.49  CBC WITH DIFFERENTIAL ([facility])     Status: Abnormal   Collection Time    03/27/13 10:23 AM      Result Value Range   WBC 2.2 (*) 3.9 - 10.0 10e3/uL   RBC 4.57  3.70 - 5.32 10e6/uL   HGB 12.3  11.6 - 15.9 g/dL   HCT 39.8  34.8 - 46.6 %   MCV 87  81 - 101 fL   MCH 26.9  26.0 - 34.0 pg   MCHC 30.9 (*) 32.0 - 36.0 g/dL   RDW 15.0  11.1 - 15.7 %   Platelets 171  145 - 400 10e3/uL   NEUT# 0.6 (*) 1.5 - 6.5 10e3/uL   LYMPH# 1.2  0.9 - 3.3 10e3/uL   MONO# 0.4  0.1 - 0.9 10e3/uL   Eosinophils Absolute 0.0  0.0 - 0.5 10e3/uL   BASO# 0.0  0.0 - 0.2 10e3/uL   NEUT% 28.6 (*) 39.6 - 80.0 %   LYMPH% 53.6 (*) 14.0 - 48.0 %   MONO% 16.4 (*) 0.0 - 13.0 %   EOS% 0.9  0.0 - 7.0 %   BASO% 0.5  0.0 - 2.0 %  D-DIMER, QUANTITATIVE     Status: Abnormal   Collection Time    03/27/13 10:23 AM      Result Value Range   D-Dimer, Quant 0.54 (*) 0.00 - 0.48 ug/mL-FEU   Comment: At the inhouse established cutoff value of 0.48 ug/mL FEU, thismethology has been documented in the literature to have a sensitivityand negative predictive value of at least 98-99%.  The test resultshould be correlated with an assessment of the clinical  probability ofDVT/VTE.  IRON AND TIBC [facility]     Status: None   Collection Time    03/27/13 10:23 AM      Result Value Range   Iron 60  41 - 142 ug/dL   TIBC 266  236 - 444 ug/dL   UIBC 206  120 - 384 ug/dL   %SAT 22  21 - 57 %  FERRITIN [facility]     Status: None   Collection Time    03/27/13 10:23 AM      Result Value Range   Ferritin 114  9 - 269 ng/ml  RETICULOCYTE COUNT ([facility])     Status: None   Collection Time    03/27/13 10:23 AM      Result Value Range   Retic Ct Pct 0.5  0.4 - 2.3 %   RBC. 4.63  3.87 - 5.11 MIL/uL   ABS Retic 23.2  19.0 - 186.0 K/uL   [facility] - SMEAR     Status: None   Collection Time    03/27/13 10:23 AM      Result Value Range   Smear Result Smear Available    PROTHROMBIN TIME     Status: Abnormal   Collection Time    03/27/13 10:23 AM      Result Value Range   Prothrombin Time 27.5 (*) 11.6 - 15.2 seconds   INR 2.64 (*) <1.50   Comment: The INR is of principal utility in following patients on stable dosesof oral anticoagulants.  The therapeutic range is generally 2.0 to3.0, but may be 3.0 to 4.0 in patients with mechanical cardiac valves,recurrent embolisms and antiphospholipid      antibodies (including lupusinhibitors).    Musculoskeletal: Strength & Muscle Tone: decreased Gait & Station: Patient is wheelchair-bound.  She has the right below-knee amputation. Patient leans: See above  Psychiatric: Speech (describe rate, volume, coherence, spontaneity, and abnormalities if any): Soft with decreased volume and tone.  At times incoherent and difficult organizing her thoughts.  Thought Process (describe rate, content, abstract reasoning, and computation): Circumstantial.  Associations: Intact, her fund of knowledge is average  Thoughts: still complaining of feeling hopelessness sometimes  Mental Status: Orientation: oriented to person, place and time/date Mood & Affect: depressed affect and anxiety Attention Span & Concentration: Poor  Medical Decision Making (Choose Three): Established Problem, Stable/Improving (1), Review of Psycho-Social Stressors (1), Review or order clinical lab tests (1), Review and summation of old records (2), Review of Last Therapy Session (1) and Review of New Medication or Change in Dosage (2)  Assessment: Axis I:Maj  Depressive disorder, recurrent, 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  Patient is taking  Lamictal , vimpat and Keppra.  She is also taking Prozac 60 mg.  She is feeling better with the medication.  I encouraged her to use prosthesis .  We also talked about physical therapy .  Patient will contact her primary care physician and neurologist for physical therapy.  She is seeing Dr. Frances Nickels .  Recommend to call us back if she has any question or any concern.  Followup in 3 months. Time spent 25 minutes.  More than 50% of the time spent in psychoeducation, counseling and coordination of care.  Discuss safety plan that anytime having active suicidal thoughts or homicidal thoughts then patient need to  call 911 or go to the local emergency room.  Followup in 2 months.  Laney Louderback T., MD 04/03/2013

## 2013-04-04 ENCOUNTER — Ambulatory Visit (HOSPITAL_BASED_OUTPATIENT_CLINIC_OR_DEPARTMENT_OTHER)
Admission: RE | Admit: 2013-04-04 | Discharge: 2013-04-04 | Disposition: A | Discharge status: Home / Self Care | Payer: Medicare Other | Source: Ambulatory Visit | Attending: Hematology & Oncology | Admitting: Hematology & Oncology

## 2013-04-04 DIAGNOSIS — I82819 Embolism and thrombosis of superficial veins of unspecified lower extremities: Secondary | ICD-10-CM | POA: Insufficient documentation

## 2013-04-04 DIAGNOSIS — S88119A Complete traumatic amputation at level between knee and ankle, unspecified lower leg, initial encounter: Secondary | ICD-10-CM | POA: Insufficient documentation

## 2013-04-04 DIAGNOSIS — I82403 Acute embolism and thrombosis of unspecified deep veins of lower extremity, bilateral: Secondary | ICD-10-CM

## 2013-04-04 DIAGNOSIS — Z86718 Personal history of other venous thrombosis and embolism: Secondary | ICD-10-CM | POA: Insufficient documentation

## 2013-04-06 ENCOUNTER — Encounter: Payer: Self-pay | Admitting: Oncology

## 2013-04-06 NOTE — Telephone Encounter (Addendum)
Message copied by Cottie Banda on Fri Apr 06, 2013  4:36 PM ------      Message from: Burney Gauze R      Created: Thu Apr 05, 2013  9:09 PM       Call - blood clots in legs are opening up.  The clots are still there but better blood flow. Laurey Arrow ------Sent pt email.

## 2013-04-19 ENCOUNTER — Ambulatory Visit (HOSPITAL_COMMUNITY): Payer: Self-pay | Admitting: Psychology

## 2013-04-20 ENCOUNTER — Telehealth: Payer: Self-pay | Admitting: Neurology

## 2013-04-20 DIAGNOSIS — G40219 Localization-related (focal) (partial) symptomatic epilepsy and epileptic syndromes with complex partial seizures, intractable, without status epilepticus: Secondary | ICD-10-CM

## 2013-04-20 MED ORDER — LEVETIRACETAM 750 MG PO TABS: ORAL_TABLET | ORAL | Status: DC

## 2013-04-20 NOTE — Telephone Encounter (Signed)
Patient's mother is calling stating that patient is still having seizures everyday, patient has an appt on 07/09/13 with Hoyle Sauer, NP. Patient's mother would like a call back, wanting to know what to do next. Please advise.

## 2013-04-20 NOTE — Telephone Encounter (Signed)
Patient's mother calling to let Dr. [name] know that patient is still having seizures every day. Mother would like a call back on what to do next. Please call to advise.

## 2013-04-20 NOTE — Telephone Encounter (Signed)
I called the patient. I talked with the mother. The patient is having episodes of seizures daily. I will go on Keppra taking 750 mg in the morning, 1500 mg in the evening. The patient is on Vimpat and Lamictal as well. The chance of controlling her seizures with these medications is very low. The patient was seen for video EEG monitoring. I'll send the patient back for possible epilepsy surgery screening. If the patient does not appear to be a candidate for surgery, we will consider a vagal nerve stimulator placement.

## 2013-04-23 ENCOUNTER — Ambulatory Visit: Payer: Medicare Other | Admitting: Nurse Practitioner

## 2013-04-26 ENCOUNTER — Telehealth: Payer: Self-pay | Admitting: Hematology & Oncology

## 2013-04-26 NOTE — Telephone Encounter (Signed)
Pt moved 3-6 to 3-25

## 2013-04-27 ENCOUNTER — Other Ambulatory Visit: Payer: Self-pay | Admitting: Lab

## 2013-04-27 ENCOUNTER — Ambulatory Visit: Payer: Self-pay | Admitting: Hematology & Oncology

## 2013-05-05 ENCOUNTER — Encounter (HOSPITAL_COMMUNITY): Payer: Self-pay | Admitting: Emergency Medicine

## 2013-05-05 ENCOUNTER — Emergency Department (HOSPITAL_COMMUNITY)
Admission: EM | Admit: 2013-05-05 | Discharge: 2013-05-05 | Disposition: A | Discharge status: Home / Self Care | Payer: Medicare Other | Attending: Emergency Medicine | Admitting: Emergency Medicine

## 2013-05-05 DIAGNOSIS — G40919 Epilepsy, unspecified, intractable, without status epilepticus: Secondary | ICD-10-CM | POA: Insufficient documentation

## 2013-05-05 DIAGNOSIS — Z859 Personal history of malignant neoplasm, unspecified: Secondary | ICD-10-CM | POA: Insufficient documentation

## 2013-05-05 DIAGNOSIS — Z8673 Personal history of transient ischemic attack (TIA), and cerebral infarction without residual deficits: Secondary | ICD-10-CM | POA: Insufficient documentation

## 2013-05-05 DIAGNOSIS — E039 Hypothyroidism, unspecified: Secondary | ICD-10-CM | POA: Insufficient documentation

## 2013-05-05 DIAGNOSIS — R569 Unspecified convulsions: Secondary | ICD-10-CM

## 2013-05-05 DIAGNOSIS — F411 Generalized anxiety disorder: Secondary | ICD-10-CM | POA: Insufficient documentation

## 2013-05-05 DIAGNOSIS — Z79899 Other long term (current) drug therapy: Secondary | ICD-10-CM | POA: Insufficient documentation

## 2013-05-05 DIAGNOSIS — Z8739 Personal history of other diseases of the musculoskeletal system and connective tissue: Secondary | ICD-10-CM | POA: Insufficient documentation

## 2013-05-05 DIAGNOSIS — Z7901 Long term (current) use of anticoagulants: Secondary | ICD-10-CM | POA: Insufficient documentation

## 2013-05-05 DIAGNOSIS — Z86718 Personal history of other venous thrombosis and embolism: Secondary | ICD-10-CM | POA: Insufficient documentation

## 2013-05-05 DIAGNOSIS — F329 Major depressive disorder, single episode, unspecified: Secondary | ICD-10-CM | POA: Insufficient documentation

## 2013-05-05 DIAGNOSIS — F3289 Other specified depressive episodes: Secondary | ICD-10-CM | POA: Insufficient documentation

## 2013-05-05 DIAGNOSIS — Z8614 Personal history of Methicillin resistant Staphylococcus aureus infection: Secondary | ICD-10-CM | POA: Insufficient documentation

## 2013-05-05 LAB — URINE MICROSCOPIC-ADD ON

## 2013-05-05 LAB — I-STAT CHEM 8, ED
BUN: 11 mg/dL (ref 6–23)
CREATININE: 0.9 mg/dL (ref 0.50–1.10)
Calcium, Ion: 1.14 mmol/L (ref 1.12–1.23)
Chloride: 99 mEq/L (ref 96–112)
Glucose, Bld: 75 mg/dL (ref 70–99)
HCT: 41 % (ref 36.0–46.0)
Hemoglobin: 13.9 g/dL (ref 12.0–15.0)
Potassium: 3.9 mEq/L (ref 3.7–5.3)
Sodium: 139 mEq/L (ref 137–147)
TCO2: 29 mmol/L (ref 0–100)

## 2013-05-05 LAB — CBC WITH DIFFERENTIAL/PLATELET
Basophils Absolute: 0 10*3/uL (ref 0.0–0.1)
Basophils Relative: 1 % (ref 0–1)
EOS PCT: 1 % (ref 0–5)
Eosinophils Absolute: 0 10*3/uL (ref 0.0–0.7)
HCT: 37.4 % (ref 36.0–46.0)
Hemoglobin: 12.1 g/dL (ref 12.0–15.0)
LYMPHS ABS: 1.5 10*3/uL (ref 0.7–4.0)
Lymphocytes Relative: 37 % (ref 12–46)
MCH: 28.3 pg (ref 26.0–34.0)
MCHC: 32.4 g/dL (ref 30.0–36.0)
MCV: 87.6 fL (ref 78.0–100.0)
Monocytes Absolute: 0.3 10*3/uL (ref 0.1–1.0)
Monocytes Relative: 8 % (ref 3–12)
Neutro Abs: 2.2 10*3/uL (ref 1.7–7.7)
Neutrophils Relative %: 53 % (ref 43–77)
Platelets: 208 10*3/uL (ref 150–400)
RBC: 4.27 MIL/uL (ref 3.87–5.11)
RDW: 15.5 % (ref 11.5–15.5)
WBC: 4.1 10*3/uL (ref 4.0–10.5)

## 2013-05-05 LAB — URINALYSIS, ROUTINE W REFLEX MICROSCOPIC
Bilirubin Urine: NEGATIVE
Glucose, UA: NEGATIVE mg/dL
Ketones, ur: NEGATIVE mg/dL
Nitrite: NEGATIVE
PROTEIN: NEGATIVE mg/dL
Specific Gravity, Urine: 1.022 (ref 1.005–1.030)
UROBILINOGEN UA: 0.2 mg/dL (ref 0.0–1.0)
pH: 7 (ref 5.0–8.0)

## 2013-05-05 LAB — PROTIME-INR
INR: 1.83 — ABNORMAL HIGH (ref 0.00–1.49)
Prothrombin Time: 20.6 seconds — ABNORMAL HIGH (ref 11.6–15.2)

## 2013-05-05 MED ORDER — LEVETIRACETAM 750 MG PO TABS: 1500.0000 mg | ORAL_TABLET | Freq: Two times a day (BID) | ORAL | Status: DC

## 2013-05-05 MED ORDER — LORAZEPAM 2 MG/ML IJ SOLN
1.0000 mg | Freq: Once | INTRAMUSCULAR | Status: AC
  Administered 2013-05-05: 1 mg via INTRAVENOUS

## 2013-05-05 MED ORDER — LEVETIRACETAM 500 MG/5ML IV SOLN
500.0000 mg | Freq: Once | INTRAVENOUS | Status: AC
  Administered 2013-05-05: 500 mg via INTRAVENOUS
  Filled 2013-05-05: qty 5

## 2013-05-05 MED ORDER — LORAZEPAM 2 MG/ML IJ SOLN
INTRAMUSCULAR | Status: AC
  Filled 2013-05-05: qty 1

## 2013-05-05 NOTE — ED Notes (Signed)
Myself and Burna Mortimer, RN was placing pt on a bedpan and pt began showing seizure activity; Alvino Chapel, MD came into room and witnessed; caretaker at beside

## 2013-05-05 NOTE — Discharge Instructions (Signed)
Epilepsy Epilepsy is a disorder in which a person has repeated seizures over time. A seizure is a release of abnormal electrical activity in the brain. Seizures can cause a change in attention, behavior, or the ability to remain awake and alert (altered mental status). Seizures often involve uncontrollable shaking (convulsions).  Most people with epilepsy lead normal lives. However, people with epilepsy are at an increased risk of falls, accidents, and injuries. Therefore, it is important to begin treatment right away. CAUSES  Epilepsy has many possible causes. Anything that disturbs the normal pattern of brain cell activity can lead to seizures. This may include:   Head injury.  Birth trauma.  High fever as a child.  Stroke.  Bleeding into or around the brain.  Certain drugs.  Prolonged low oxygen, such as what occurs after CPR efforts.  Abnormal brain development.  Certain illnesses, such as meningitis, encephalitis (brain infection), malaria, and other infections.  An imbalance of nerve signaling chemicals (neurotransmitters).  SIGNS AND SYMPTOMS  The symptoms of a seizure can vary greatly from one person to another. Right before a seizure, you may have a warning (aura) that a seizure is about to occur. An aura may include the following symptoms:  Fear or anxiety.  Nausea.  Feeling like the room is spinning (vertigo).  Vision changes, such as seeing flashing lights or spots. Common symptoms during a seizure include:  Abnormal sensations, such as an abnormal smell or a bitter taste in the mouth.   Sudden, general body stiffness.   Convulsions that involve rhythmic jerking of the face, arm, or leg on one or both sides.   Sudden change in consciousness.   Appearing to be awake but not responding.   Appearing to be asleep but cannot be awakened.   Grimacing, chewing, lip smacking, drooling, tongue biting, or loss of bowel or bladder control. After a seizure,  you may feel sleepy for a while. DIAGNOSIS  Your health care provider will ask about your symptoms and take a medical history. Descriptions from any witnesses to your seizures will be very helpful in the diagnosis. A physical exam, including a detailed neurological exam, is necessary. Various tests may be done, such as:   An electroencephalogram (EEG). This is a painless test of your brain waves. In this test, a diagram is created of your brain waves. These diagrams can be interpreted by a specialist.  An MRI of the brain.   A CT scan of the brain.   A spinal tap (lumbar puncture, LP).  Blood tests to check for signs of infection or abnormal blood chemistry. TREATMENT  There is no cure for epilepsy, but it is generally treatable. Once epilepsy is diagnosed, it is important to begin treatment as soon as possible. For most people with epilepsy, seizures can be controlled with medicines. The following may also be used:  A pacemaker for the brain (vagus nerve stimulator) can be used for people with seizures that are not well controlled by medicine.  Surgery on the brain. For some people, epilepsy eventually goes away. HOME CARE INSTRUCTIONS   Follow your health care provider's recommendations on driving and safety in normal activities.  Get enough rest. Lack of sleep can cause seizures.  Only take over-the-counter or prescription medicines as directed by your health care provider. Take any prescribed medicine exactly as directed.  Avoid any known triggers of your seizures.  Keep a seizure diary. Record what you recall about any seizure, especially any possible trigger.   Make   sure the people you live and work with know that you are prone to seizures. They should receive instructions on how to help you. In general, a witness to a seizure should:   Cushion your head and body.   Turn you on your side.   Avoid unnecessarily restraining you.   Not place anything inside your  mouth.   Call for emergency medical help if there is any question about what has occurred.   Follow up with your health care provider as directed. You may need regular blood tests to monitor the levels of your medicine.  SEEK MEDICAL CARE IF:   You develop signs of infection or other illness. This might increase the risk of a seizure.   You seem to be having more frequent seizures.   Your seizure pattern is changing.  SEEK IMMEDIATE MEDICAL CARE IF:   You have a seizure that does not stop after a few moments.   You have a seizure that causes any difficulty in breathing.   You have a seizure that results in a very severe headache.   You have a seizure that leaves you with the inability to speak or use a part of your body.  Document Released: 02/08/2005 Document Revised: 11/29/2012 Document Reviewed: 09/20/2012 [employer] Patient Information 2014 [employer].  

## 2013-05-05 NOTE — ED Notes (Signed)
Received pt from home with c/o seizure lasting for more than 20 mins. Pt given 5mg  of versed. Pt was having full body seizure witnessed by EMS. Family reports that pt has been having seizures daily.

## 2013-05-05 NOTE — ED Provider Notes (Signed)
CSN: [id number]     Arrival date & time 05/05/13  8527 History   First MD Initiated Contact with Patient 05/05/13 0920     Chief Complaint  Patient presents with  . Seizures     (Consider location/radiation/quality/duration/timing/severity/associated sxs/prior Treatment) The history is provided by the patient, the EMS personnel and a parent.   level V caveat due to altered mental status. Patient presents after a seizure. She has a history of daily seizures, but this 1 has been more severe. Reportedly had some slurred speech and a progressed to generalized tonic-clonic activity. She was given 5 mg of Versed by EMS. She is on 3 different antiepileptics. She has had some urinary frequency over the last day or 2. No reported trauma from the seizure. She also is on Coumadin from a DVT.  Past Medical History  Diagnosis Date  . Anxiety   . Depression   . Seizures   . CVA (cerebrovascular accident due to intracerebral hemorrhage) 2004  . Arthritis   . MRSA (methicillin resistant Staphylococcus aureus)   . S/P BKA (below knee amputation) unilateral     Right   . Thyroid disease   . Cancer     Osteosarcoma, right leg  . 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 DVT (deep venous thrombosis)     Left leg   Past Surgical History  Procedure Laterality Date  . Fracture surgery      Rt. femur, folllowed by MRSA  . Brain surgery  2004    cerebral aneurysm  . Total knee arthroplasty Right   . Above knee leg amputation Right     Osteosarcoma   Family History  Problem Relation Age of Onset  . Anxiety disorder Mother   . Diabetes Father    History  Substance Use Topics  . Smoking status: Never Smoker   . Smokeless tobacco: Never Used     Comment: never used product  . Alcohol Use: No   OB History   Grav Para Term Preterm Abortions TAB SAB Ect Mult Living                 Review of Systems  Unable to perform ROS     Allergies  Vicodin and Morphine and  related  Home Medications   Current Outpatient Rx  Name  Route  Sig  Dispense  Refill  . Ascorbic Acid (VITAMIN C) 1000 MG tablet   Oral   Take 1,000 mg by mouth daily.          . Calcium Carbonate-Vitamin D 600-400 MG-UNIT per tablet   Oral   Take 1 tablet by mouth daily.          . ferrous sulfate 325 (65 FE) MG tablet   Oral   Take 325 mg by mouth daily with breakfast.         . FLUoxetine (PROZAC) 20 MG capsule   Oral   Take 20-40 mg by mouth 2 (two) times daily. Take 20mg  in the morning and 40mg  in the evening         . lacosamide (VIMPAT) 200 MG TABS tablet   Oral   Take 1 tablet (200 mg total) by mouth 2 (two) times daily.   60 tablet   5   . lamoTRIgine (LAMICTAL) 200 MG tablet   Oral   Take 400 mg by mouth 2 (two) times daily.          [name] levothyroxine (SYNTHROID, LEVOTHROID) 50 MCG tablet  Oral   Take 50 mcg by mouth daily before breakfast.         . promethazine (PHENERGAN) 25 MG tablet   Oral   Take 25 mg by mouth every 6 (six) hours as needed. nausea         . warfarin (COUMADIN) 5 MG tablet   Oral   Take 10 mg by mouth every morning.          . levETIRAcetam (KEPPRA) 750 MG tablet   Oral   Take 2 tablets (1,500 mg total) by mouth 2 (two) times daily.   120 tablet   0    BP 104/68  Pulse 93  Temp(Src) 97.2 F (36.2 C) (Rectal)  Resp 16  Ht 5\' 7"  (1.702 m)  Wt 153 lb (69.4 kg)  BMI 23.96 kg/m2  SpO2 97% Physical Exam  Constitutional: She appears well-developed.  HENT:  Head: Normocephalic.  Very small scratch to left eyebrow  Eyes:  Patient will hold eyelids shut with attempted evaluation  Neck: Neck supple.  Cardiovascular: Normal rate and regular rhythm.   Pulmonary/Chest: Effort normal.  Abdominal: Soft. There is no tenderness.  Musculoskeletal:  Right AKA.  Neurological:  Patient appears to move both upper extremities to pain. She will not follow commands. She will move both lower extremities also.  Skin: Skin  is warm.    ED Course  Procedures (including critical care time) Labs Review Labs Reviewed  URINALYSIS, ROUTINE W REFLEX MICROSCOPIC - Abnormal; Notable for the following:    Hgb urine dipstick MODERATE (*)    Leukocytes, UA SMALL (*)    All other components within normal limits  PROTIME-INR - Abnormal; Notable for the following:    Prothrombin Time 20.6 (*)    INR 1.83 (*)    All other components within normal limits  URINE MICROSCOPIC-ADD ON - Abnormal; Notable for the following:    Squamous Epithelial / LPF FEW (*)    All other components within normal limits  URINE CULTURE  CBC WITH DIFFERENTIAL  I-STAT CHEM 8, ED   Imaging Review No results found.   EKG Interpretation None      MDM   Final diagnoses:  Unspecified epilepsy with intractable epilepsy  Seizure    Patient with recurrent seizure. He has history of same. Discussed with neurology, who saw the patient. Have loaded on Keppra and increase the Keppra dose. Patient has returned to her baseline after monitoring.    Jasper Riling. Alvino Chapel, MD 05/06/13 1525

## 2013-05-05 NOTE — Consult Note (Signed)
Reason for Consult:Seizures Referring Physician: Alvino Chapel  CC: Seizures  HPI: Cassandra Andrade is an 48 y.o. female with a long history of seizures.  Is on Vimpat, Keppra and Lamictal at home.  Has had an increase in seizures recently although it seems that she has never been seizure free.  Was having seizures daily in February.  Keppra was increased at that time to 750mg  in the morning and 1500mg  at night.  Her mother reports that there was an improvement in seizures after that increase but the seizures have continued.  Today after noted seizures the patient was combative and unable to speak.  The mother reports that when this happens she feels that she needs to bring her in.  This usually happens about 4 times a year.    Past Medical History  Diagnosis Date  . Anxiety   . Depression   . Seizures   . CVA (cerebrovascular accident due to intracerebral hemorrhage) 2004  . Arthritis   . MRSA (methicillin resistant Staphylococcus aureus)   . S/P BKA (below knee amputation) unilateral     Right   . Thyroid disease   . Cancer     Osteosarcoma, right leg  . 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 DVT (deep venous thrombosis)     Left leg    Past Surgical History  Procedure Laterality Date  . Fracture surgery      Rt. femur, folllowed by MRSA  . Brain surgery  2004    cerebral aneurysm  . Total knee arthroplasty Right   . Above knee leg amputation Right     Osteosarcoma    Family History  Problem Relation Age of Onset  . Anxiety disorder Mother   . Diabetes Father     Social History:  reports that she has never smoked. She has never used smokeless tobacco. She reports that she does not drink alcohol or use illicit drugs.  Allergies  Allergen Reactions  . Vicodin [Hydrocodone-Acetaminophen] Nausea And Vomiting  . Morphine And Related Rash    Allergic reaction only in iv form    Medications: I have reviewed the patient's current medications. Prior to  Admission:  Current outpatient prescriptions: Ascorbic Acid (VITAMIN C) 1000 MG tablet, Take 1,000 mg by mouth daily. , Disp: , Rfl: ;   Calcium Carbonate-Vitamin D 600-400 MG-UNIT per tablet, Take 1 tablet by mouth daily. , Disp: , Rfl: ;   ferrous sulfate 325 (65 FE) MG tablet, Take 325 mg by mouth daily with breakfast., Disp: , Rfl:  FLUoxetine (PROZAC) 20 MG capsule, Take 20-40 mg by mouth 2 (two) times daily. Take 20mg  in the morning and 40mg  in the evening, Disp: , Rfl: ;   lacosamide (VIMPAT) 200 MG TABS tablet, Take 1 tablet (200 mg total) by mouth 2 (two) times daily., Disp: 60 tablet, Rfl: 5;  lamoTRIgine (LAMICTAL) 200 MG tablet, Take 400 mg by mouth 2 (two) times daily. , Disp: , Rfl:  levETIRAcetam (KEPPRA) 750 MG tablet, Take 750-1,500 mg by mouth 2 (two) times daily. Take 750mg  in the morning and 1500mg  in the evening, Disp: , Rfl: ;   levothyroxine (SYNTHROID, LEVOTHROID) 50 MCG tablet, Take 50 mcg by mouth daily before breakfast., Disp: , Rfl: ;  promethazine (PHENERGAN) 25 MG tablet, Take 25 mg by mouth every 6 (six) hours as needed. nausea, Disp: , Rfl:  warfarin (COUMADIN) 5 MG tablet, Take 10 mg by mouth every morning. , Disp: , Rfl:   ROS: History obtained from  mother  General ROS: negative for - chills, fatigue, fever, night sweats, weight gain or weight loss Psychological ROS: negative for - behavioral disorder, hallucinations, memory difficulties, mood swings or suicidal ideation Ophthalmic ROS: negative for - blurry vision, double vision, eye pain or loss of vision ENT ROS: negative for - epistaxis, nasal discharge, oral lesions, sore throat, tinnitus or vertigo Allergy and Immunology ROS: negative for - hives or itchy/watery eyes Hematological and Lymphatic ROS: negative for - bleeding problems, bruising or swollen lymph nodes Endocrine ROS: negative for - galactorrhea, hair pattern changes, polydipsia/polyuria or temperature intolerance Respiratory ROS: negative for -  cough, hemoptysis, shortness of breath or wheezing Cardiovascular ROS: negative for - chest pain, dyspnea on exertion, edema or irregular heartbeat Gastrointestinal ROS: negative for - abdominal pain, diarrhea, hematemesis, nausea/vomiting or stool incontinence Genito-Urinary ROS: negative for - dysuria, hematuria, incontinence or urinary frequency/urgency Musculoskeletal ROS: negative for - joint swelling or muscular weakness Neurological ROS: as noted in HPI Dermatological ROS: itchy rash at neck  Physical Examination: Blood pressure 133/75, pulse 85, temperature 97.2 F (36.2 C), temperature source Rectal, resp. rate 13, height 5\' 7"  (1.702 m), weight 69.4 kg (153 lb), SpO2 100.00%.  Neurologic Examination Mental Status: Alert.  Able to hold a conversation.  Follows simple commands.  Speech fluent but slurred. Cranial Nerves: II: Discs flat bilaterally; Visual fields grossly normal. III,IV, VI: ptosis not present, Left exotropia with limitation of upward gaze bilaterally V,VII: smile symmetric, facial light touch sensation normal bilaterally VIII: hearing normal bilaterally IX,X: gag reflex present XI: bilateral shoulder shrug XII: midline tongue extension Motor: Increased tone throughout.  Moves all extremities Sensory: Pinprick and light touch intact throughout, bilaterally Deep Tendon Reflexes: Symmetric in the upper extremities.  Right AKA Plantars: Right: right AKA   Left: downgoing Cerebellar: Unable to test  Laboratory Studies:   Basic Metabolic Panel: No results found for this basename: NA, K, CL, CO2, GLUCOSE, BUN, CREATININE, CALCIUM, MG, PHOS,  in the last 168 hours  Liver Function Tests: No results found for this basename: AST, ALT, ALKPHOS, BILITOT, PROT, ALBUMIN,  in the last 168 hours No results found for this basename: LIPASE, AMYLASE,  in the last 168 hours No results found for this basename: AMMONIA,  in the last 168 hours  CBC: No results found for this  basename: WBC, NEUTROABS, HGB, HCT, MCV, PLT,  in the last 168 hours  Cardiac Enzymes: No results found for this basename: CKTOTAL, CKMB, CKMBINDEX, TROPONINI,  in the last 168 hours  BNP: No components found with this basename: POCBNP,   CBG: No results found for this basename: GLUCAP,  in the last 168 hours  Microbiology: Results for orders placed during the hospital encounter of 02/17/13  URINE CULTURE     Status: None   Collection Time    02/17/13  5:44 AM      Result Value Ref Range Status   Specimen Description URINE, CATHETERIZED   Final   Special Requests NONE   Final   Culture  Setup Time     Final   Value: 02/17/2013 14:18     Performed at [facility]     Final   Value: 15,000 COLONIES/ML     Performed at [facility]   Culture     Final   Value: ESCHERICHIA COLI     Performed at [facility]   Report Status 02/19/2013 FINAL   Final   Organism ID, Bacteria ESCHERICHIA  COLI   Final    Coagulation Studies: No results found for this basename: LABPROT, INR,  in the last 72 hours  Urinalysis: No results found for this basename: COLORURINE, APPERANCEUR, LABSPEC, PHURINE, GLUCOSEU, HGBUR, BILIRUBINUR, KETONESUR, PROTEINUR, UROBILINOGEN, NITRITE, LEUKOCYTESUR,  in the last 168 hours  Lipid Panel:  No results found for this basename: chol, trig, hdl, cholhdl, vldl, ldlcalc    HgbA1C:  Lab Results  Component Value Date   HGBA1C  Value: 4.3 (NOTE)                                                                       According to the ADA Clinical Practice Recommendations for 2011, when HbA1c is used as a screening test:   >=6.5%   Diagnostic of Diabetes Mellitus           (if abnormal result  is confirmed)  5.7-6.4%   Increased risk of developing Diabetes Mellitus  References:Diagnosis and Classification of Diabetes Mellitus,Diabetes Care,2011,34(Suppl 1):S62-S69 and Standards of Medical Care in         Diabetes - 2011,Diabetes  Care,2011,34  (Suppl 1):S11-S61.* 12/26/2009    Urine Drug Screen:      Component Value Date/Time   LABOPIA POSITIVE* 02/17/2013 0544   COCAINSCRNUR NONE DETECTED 02/17/2013 0544   LABBENZ NONE DETECTED 02/17/2013 0544   AMPHETMU NONE DETECTED 02/17/2013 0544   THCU NONE DETECTED 02/17/2013 0544   LABBARB NONE DETECTED 02/17/2013 0544    Alcohol Level: No results found for this basename: ETH,  in the last 168 hours   Imaging: No results found.   Assessment/Plan: 48 year old female with a history of intractable seizures presenting with worsening of seizures.  Patient on Keppra, Vimpat and Lamictal.  Scheduled for further monitoring at [facility] next month.  Received Versed but now returning to baseline.    Recommendations: 1.  Keppra 500mg  IV now 2.  Would increase maintenance Keppra to 1500mg  BID 3.  Continue Vimpat and Lamictal at current doses  Case dicussed with Dr. Mordecai Maes, MD [facility] [phone] 05/05/2013, 11:42 AM

## 2013-05-05 NOTE — ED Notes (Signed)
Dr. Ilean Skill at bedside.

## 2013-05-05 NOTE — ED Notes (Signed)
Patients mother removed IV without informing staff.

## 2013-05-05 NOTE — ED Notes (Signed)
Pt undressed, in gown, on monitor, continuous pulse oximetry and blood pressure cuff; caretaker at bedside

## 2013-05-06 ENCOUNTER — Other Ambulatory Visit (HOSPITAL_COMMUNITY): Payer: Self-pay | Admitting: Psychiatry

## 2013-05-06 LAB — URINE CULTURE: Colony Count: 35000

## 2013-05-10 ENCOUNTER — Other Ambulatory Visit (HOSPITAL_COMMUNITY): Payer: Self-pay | Admitting: Psychiatry

## 2013-05-16 ENCOUNTER — Other Ambulatory Visit (HOSPITAL_BASED_OUTPATIENT_CLINIC_OR_DEPARTMENT_OTHER): Payer: Medicare Other | Admitting: Lab

## 2013-05-16 ENCOUNTER — Ambulatory Visit (HOSPITAL_BASED_OUTPATIENT_CLINIC_OR_DEPARTMENT_OTHER): Payer: Medicare Other | Admitting: Hematology & Oncology

## 2013-05-16 ENCOUNTER — Encounter: Payer: Self-pay | Admitting: Hematology & Oncology

## 2013-05-16 VITALS — BP 104/76 | HR 80 | Temp 98.0°F | Resp 16 | Ht 67.0 in | Wt 172.0 lb

## 2013-05-16 DIAGNOSIS — I82403 Acute embolism and thrombosis of unspecified deep veins of lower extremity, bilateral: Secondary | ICD-10-CM

## 2013-05-16 DIAGNOSIS — I82409 Acute embolism and thrombosis of unspecified deep veins of unspecified lower extremity: Secondary | ICD-10-CM

## 2013-05-16 DIAGNOSIS — G894 Chronic pain syndrome: Secondary | ICD-10-CM

## 2013-05-16 DIAGNOSIS — D6859 Other primary thrombophilia: Secondary | ICD-10-CM

## 2013-05-16 LAB — CBC WITH DIFFERENTIAL (CANCER CENTER ONLY)
BASO#: 0 10*3/uL (ref 0.0–0.2)
BASO%: 0.4 % (ref 0.0–2.0)
EOS ABS: 0.1 10*3/uL (ref 0.0–0.5)
EOS%: 1.3 % (ref 0.0–7.0)
HCT: 38.6 % (ref 34.8–46.6)
HGB: 12 g/dL (ref 11.6–15.9)
LYMPH#: 1.7 10*3/uL (ref 0.9–3.3)
LYMPH%: 36 % (ref 14.0–48.0)
MCH: 28.3 pg (ref 26.0–34.0)
MCHC: 31.1 g/dL — ABNORMAL LOW (ref 32.0–36.0)
MCV: 91 fL (ref 81–101)
MONO#: 0.4 10*3/uL (ref 0.1–0.9)
MONO%: 8.3 % (ref 0.0–13.0)
NEUT#: 2.5 10*3/uL (ref 1.5–6.5)
NEUT%: 54 % (ref 39.6–80.0)
PLATELETS: 200 10*3/uL (ref 145–400)
RBC: 4.24 10*6/uL (ref 3.70–5.32)
RDW: 15.7 % (ref 11.1–15.7)
WBC: 4.6 10*3/uL (ref 3.9–10.0)

## 2013-05-16 MED ORDER — TRAMADOL HCL 50 MG PO TABS
50.0000 mg | ORAL_TABLET | Freq: Four times a day (QID) | ORAL | Status: DC | PRN
Start: 2013-05-16 — End: 2013-12-20

## 2013-05-16 NOTE — Progress Notes (Signed)
Hematology and Oncology Follow Up Visit  Caral Whan [id number] Aug 12, 1965 48 y.o. 05/16/2013   Principle Diagnosis:  Bilateral lower extremity deep venous thrombosis. 2. Right below-knee amputation for osteogenic sarcoma. 3. Multiple miscarriages. 4. Positive lupus anticoagulant.  Current Therapy:   Coumadin to maintain INR between 2-3.     Interim History:  Ms.  Common is is back for followup. We did repeat a Doppler. This was done on February 11. 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 I suspect that she likely will have a chronic thrombus. 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  She did have another seizure since we saw her. She was hospitalized for this. There may be some medicine adjustment to try to help with the seizure.  Because of her seizures, we do not have her on one of the new oral anticoagulants.  She's having her Coumadin checked weekly.  Her appetite is good. She does have some pain in the right leg where she had the". I gave them a prescription for Ultram.  There's been no cough. She's had no fever.  Medications: Current outpatient prescriptions:acetaminophen-codeine (TYLENOL #3) 300-30 MG per tablet, Take 1 tablet by mouth., Disp: , Rfl: ;  Ascorbic Acid (VITAMIN C) 1000 MG tablet, Take 1,000 mg by mouth daily. , Disp: , Rfl: ;  Calcium Carbonate-Vitamin D 600-400 MG-UNIT per tablet, Take 1 tablet by mouth daily. , Disp: , Rfl: ;  ferrous sulfate 325 (65 FE) MG tablet, Take 325 mg by mouth daily with breakfast., Disp: , Rfl:  FLUoxetine (PROZAC) 20 MG capsule, Take 20-40 mg by mouth 2 (two) times daily. Take 20mg  in the morning and 40mg  in the evening, Disp: , Rfl: ;  lacosamide (VIMPAT) 200 MG TABS tablet, Take 1 tablet (200 mg total) by mouth 2 (two) times daily., Disp: 60 tablet, Rfl: 5;  lamoTRIgine (LAMICTAL) 200 MG tablet, Take 400 mg by mouth 2 (two)  times daily. , Disp: , Rfl:  levETIRAcetam (KEPPRA) 750 MG tablet, Take 2 tablets (1,500 mg total) by mouth 2 (two) times daily., Disp: 120 tablet, Rfl: 0;  levothyroxine (SYNTHROID, LEVOTHROID) 50 MCG tablet, Take 50 mcg by mouth daily before breakfast., Disp: , Rfl: ;  promethazine (PHENERGAN) 25 MG tablet, Take 25 mg by mouth every 6 (six) hours as needed. nausea, Disp: , Rfl: ;  warfarin (COUMADIN) 5 MG tablet, Take 10 mg by mouth every morning. , Disp: , Rfl:  traMADol (ULTRAM) 50 MG tablet, Take 1 tablet (50 mg total) by mouth every 6 (six) hours as needed., Disp: 60 tablet, Rfl: 3  Allergies:  Allergies  Allergen Reactions  . Vicodin [Hydrocodone-Acetaminophen] Nausea And Vomiting  . Morphine Rash  . Morphine And Related Rash    Allergic reaction only in iv form    Past Medical History, Surgical history, Social history, and Family History were reviewed and updated.  Review of Systems: As above  Physical Exam:  height is 5\' 7"  (1.702 m) and weight is 172 lb (78.019 kg). Her oral temperature is 98 F (36.7 C). Her blood pressure is 104/76 and her pulse is 80. Her respiration is 16.   Well-developed and well-nourished African American female. Her lungs are clear. Head and neck exam shows no ocular or oral lesions. There is no adenopathy in her neck. Cardiac exam regular rate rhythm with no murmurs rubs or bruits. Abdomen soft. She's good bowel  sounds. There is no palpable liver or spleen tip. Extremities shows the right below knee amputation. Left leg does not show any swelling. No venous cord is noted. She has good range of motion of the left leg. Skin exam no rashes. No ecchymosis or petechia. Neurological exam is nonfocal.  Lab Results  Component Value Date   WBC 4.6 05/16/2013   HGB 12.0 05/16/2013   HCT 38.6 05/16/2013   MCV 91 05/16/2013   PLT 200 05/16/2013     Chemistry      Component Value Date/Time   NA 139 05/05/2013 1217   NA 139 10/24/2012 1031   K 3.9 05/05/2013 1217    CL 99 05/05/2013 1217   CO2 23 02/22/2013 0455   BUN 11 05/05/2013 1217   BUN 11 10/24/2012 1031   CREATININE 0.90 05/05/2013 1217      Component Value Date/Time   CALCIUM 8.8 02/22/2013 0455   CALCIUM 8.4 12/26/2009 1025   ALKPHOS 105 02/17/2013 0313   AST 26 02/17/2013 0313   ALT 33 02/17/2013 0313   BILITOT 0.2* 02/17/2013 0313         Impression and Plan: Ms. Griffy is a 36 her old Afro-American female. She has a lupus anticoagulant. I have to believe that this is a true lupus anticoagulant. I think it is clinically significant.  I think she's going to need to be on long-term anticoagulation. This think that she is at significant risk for thrombotic episodes if we got her off anticoagulation.  I want to do another Doppler on her left leg in about 3 months.  We'll see her back in 6 weeks.  I spent about half hour with him. I went over the Doppler report. I went over the lab work and explained the lupus anticoagulant.   Volanda Napoleon, MD 3/25/20155:35 PM

## 2013-05-17 ENCOUNTER — Telehealth: Payer: Self-pay | Admitting: Hematology & Oncology

## 2013-05-17 NOTE — Telephone Encounter (Signed)
Pt aware of 5-6 appointment °

## 2013-05-18 ENCOUNTER — Ambulatory Visit (INDEPENDENT_AMBULATORY_CARE_PROVIDER_SITE_OTHER): Payer: Medicare Other | Admitting: Psychology

## 2013-05-18 DIAGNOSIS — F3289 Other specified depressive episodes: Secondary | ICD-10-CM

## 2013-05-18 DIAGNOSIS — F32A Depression, unspecified: Secondary | ICD-10-CM

## 2013-05-18 DIAGNOSIS — F329 Major depressive disorder, single episode, unspecified: Secondary | ICD-10-CM

## 2013-05-18 NOTE — Progress Notes (Signed)
   THERAPIST PROGRESS NOTE  Session Time: 1:40pm-2:30pm  Participation Level: Active  Behavioral Response: Casual and Fairly GroomedAlertDepressed  Type of Therapy: Individual Therapy  Treatment Goals addressed: Diagnosis: Depressive D/O and coping w/ depression, medical illnesses.  Interventions: CBT and Strength-based  Summary: Cassandra Andrade is a 48 y.o. female who presents with depressed affect and report of mood depressed at times.  Pt has transitioned counseling from provider who is no longer working in this office.  Pt was able to share her struggles w/ depression since medical problems related to aneurysm, stroke and seizures.  Pt reported on goals she has for self to go back to school and earn degree despite limitations.  Pt reports this feels like she would be given purpose in her life again.  Pt reports on how she and mom see things differently at times and reports she hears from mom that she is "given up" as doesn't think she is doing as much as used to.  Pt reports she doesn't think this way- but aware that maybe her behaviors do.  Pt discussed wanting physical therapy again to push her to gain more confidence and skill in walking w/ prosthetic. Pt wanted counselor to inform mom about seeking referral from PCP as she is aware struggles w/ memory.  Suicidal/Homicidal: Nowithout intent/plan  Therapist Response: Assessed pt current functioning per pt report.  Focused on rapport building w/ pt and exploring pt story and tx hx.  Processed w/pt steps she is taking in her life currently and how to make next steps with.  Discussed lack of progress w/ walking and connecting w/ resources.      Plan: Return again in 3-4 weeks. Pt to continue w/ Dr. Adele Schilder for psychiatry.  Pt to f/u w/ PCP w/ mom assistance to explore referral for physical therapy again.   Diagnosis: Axis I: Depressive Disorder NOS    Axis II: No diagnosis    Naresh Althaus, LPC 05/18/2013

## 2013-05-21 LAB — LUPUS ANTICOAGULANT PANEL
DRVVT 1 1 MIX: 36.3 s (ref <42.9)
DRVVT: 53.9 secs — ABNORMAL HIGH (ref <42.9)
LUPUS ANTICOAGULANT: NOT DETECTED
PTT Lupus Anticoagulant: 49.7 secs — ABNORMAL HIGH (ref 28.0–43.0)
PTTLA 4:1 Mix: 38.8 secs (ref 28.0–43.0)

## 2013-05-21 LAB — PROTHROMBIN TIME
INR: 2.3 — ABNORMAL HIGH (ref <1.50)
PROTHROMBIN TIME: 24.7 s — AB (ref 11.6–15.2)

## 2013-06-21 DIAGNOSIS — G40209 Localization-related (focal) (partial) symptomatic epilepsy and epileptic syndromes with complex partial seizures, not intractable, without status epilepticus: Secondary | ICD-10-CM | POA: Insufficient documentation

## 2013-06-27 ENCOUNTER — Telehealth: Payer: Self-pay | Admitting: Hematology & Oncology

## 2013-06-27 ENCOUNTER — Ambulatory Visit: Payer: Self-pay | Admitting: Hematology & Oncology

## 2013-06-27 ENCOUNTER — Other Ambulatory Visit: Payer: Self-pay | Admitting: Lab

## 2013-06-27 NOTE — Telephone Encounter (Signed)
Mother moved 5-6 to 5-20 she is sick

## 2013-06-29 ENCOUNTER — Ambulatory Visit (HOSPITAL_COMMUNITY): Payer: Self-pay | Admitting: Psychology

## 2013-07-02 ENCOUNTER — Ambulatory Visit (INDEPENDENT_AMBULATORY_CARE_PROVIDER_SITE_OTHER): Payer: Medicare Other | Admitting: Psychiatry

## 2013-07-02 ENCOUNTER — Encounter (HOSPITAL_COMMUNITY): Payer: Self-pay | Admitting: Psychiatry

## 2013-07-02 VITALS — BP 105/71 | HR 83 | Ht 67.5 in

## 2013-07-02 DIAGNOSIS — F329 Major depressive disorder, single episode, unspecified: Secondary | ICD-10-CM

## 2013-07-02 DIAGNOSIS — F339 Major depressive disorder, recurrent, unspecified: Secondary | ICD-10-CM

## 2013-07-02 MED ORDER — FLUOXETINE HCL 20 MG PO CAPS: 20.0000 mg | ORAL_CAPSULE | Freq: Two times a day (BID) | ORAL | Status: DC

## 2013-07-02 NOTE — Progress Notes (Addendum)
[facility] Progress Note  Brinkley Marquiss [id number] 48 y.o.  07/02/2013 3:41 PM  Chief Complaint:  Medication management and followup.      History of Present Illness: Cassandra Andrade came for her followup appointment with her mother.  She was seen in the emergency room in March because of seizures .  Her seizure medicines were adjusted again.  The patient is compliant with Prozac 60 mg.  She is feeling less depressed and less anxious however she still have difficulty leaving her house because of anxiety.  She is sleeping on and off.  Her mother is concerned because she is not using prosthesis .  She is also not doing physical therapy.  However patient mood and irritability is improved.  She denies any tremors or shakes.  She denies any hallucination, paranoia or any active or passive suicidal thoughts and homicidal thoughts.  She admitted not using prosthesis because she is scared that she may fall.  Mother does that she forgot the skills of using prosthesis and may require intense teaching again.  Patient is still had some times hopeless feelings but denies any worsening of depression.  Her appetite is okay.  Patient admitted missing therapist appointments would like to reschedule again.  Her vitals are stable.  Patient lives with her mother.  Suicidal Ideation: Yes Plan Formed: No Patient has means to carry out plan: No  Homicidal Ideation: No Plan Formed: No Patient has means to carry out plan: No  Review of Systems  Musculoskeletal: Positive for back pain.  Neurological: Positive for focal weakness and seizures.  Psychiatric/Behavioral: Positive for memory loss. Negative for hallucinations and substance abuse. The patient is nervous/anxious.     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  She has history of MRSA resulted right knee amputation.  She has hypothyroidism, chronic neuropathy pain , headache and seizure disorder.  She see Dr. [name] for seizures.  Her primary care is [name] at [facility].      Outpatient Encounter Prescriptions as of 07/02/2013  Medication Sig  . acetaminophen-codeine (TYLENOL #3) 300-30 MG per tablet Take 1 tablet by mouth.  . Ascorbic Acid (VITAMIN C) 1000 MG tablet Take 1,000 mg by mouth daily.   . Calcium Carbonate-Vitamin D 600-400 MG-UNIT per tablet Take 1 tablet by mouth daily.   . ferrous sulfate 325 (65 FE) MG tablet Take 325 mg by mouth daily with breakfast.  . FLUoxetine (PROZAC) 20 MG capsule Take 1-2 capsules (20-40 mg total) by mouth 2 (two) times daily. Take 20mg  in the morning and 40mg  in the evening  . lacosamide (VIMPAT) 200 MG TABS tablet Take 1 tablet (200 mg total) by mouth 2 (two) times daily.  [name] lamoTRIgine (LAMICTAL) 200 MG tablet Take 400 mg by mouth 2 (two) times daily.   [name] levETIRAcetam (KEPPRA) 750 MG tablet Take 2 tablets (1,500 mg total) by mouth 2 (two) times daily.  [name] levothyroxine (SYNTHROID, LEVOTHROID) 50 MCG tablet Take 50 mcg by mouth daily before breakfast.  . promethazine (PHENERGAN) 25 MG tablet Take 25 mg by mouth every 6 (six) hours as needed. nausea  . traMADol (ULTRAM) 50 MG tablet Take 1 tablet (50 mg  total) by mouth every 6 (six) hours as needed.  . warfarin (COUMADIN) 5 MG tablet Take 10 mg by mouth every morning.   . [DISCONTINUED] FLUoxetine (PROZAC) 20 MG capsule Take 20-40 mg by mouth 2 (two) times daily. Take 20mg  in the morning and 40mg  in the evening     Past Psychiatric History/Hospitalization(s): Patient has at least one psychiatric admission in 2011 due to overdose on  her pain medication.  At that time she has a marital conflict and having argument with her husband.  She denies any history of mania psychosis hallucination.  In the past she has given Zoloft and Remeron however she do not remember the details.  She remembered taking Lexapro and Abilify but it was stopped because lack of response.   Anxiety: Yes Bipolar Disorder: No Depression: Yes Mania: No Psychosis: No Schizophrenia: No Personality Disorder: No Hospitalization for psychiatric illness: Yes History of Electroconvulsive Shock Therapy: No Prior Suicide Attempts: Yes  Physical Exam: Constitutional:  BP 105/71  Pulse 83  Ht 5' 7.5" (1.715 m)  General Appearance: alert, oriented, no acute distress and Cassandra Andrade dressed and fairly groomed.  She is a poor historian and maintained poor eye contact.  Recent Results (from the past 2160 hour(s))  URINALYSIS, ROUTINE W REFLEX MICROSCOPIC     Status: Abnormal   Collection Time    05/05/13 11:10 AM      Result Value Ref Range   Color, Urine YELLOW  YELLOW   APPearance CLEAR  CLEAR   Specific Gravity, Urine 1.022  1.005 - 1.030   pH 7.0  5.0 - 8.0   Glucose, UA NEGATIVE  NEGATIVE mg/dL   Hgb urine dipstick MODERATE (*) NEGATIVE   Bilirubin Urine NEGATIVE  NEGATIVE   Ketones, ur NEGATIVE  NEGATIVE mg/dL   Protein, ur NEGATIVE  NEGATIVE mg/dL   Urobilinogen, UA 0.2  0.0 - 1.0 mg/dL   Nitrite NEGATIVE  NEGATIVE   Leukocytes, UA SMALL (*) NEGATIVE  URINE MICROSCOPIC-ADD ON     Status: Abnormal   Collection Time    05/05/13 11:10 AM      Result Value Ref Range   Squamous Epithelial / LPF FEW (*) RARE   WBC, UA 3-6  <3 WBC/hpf   RBC / HPF 11-20  <3 RBC/hpf  URINE CULTURE     Status: None   Collection Time    05/05/13 11:10 AM      Result Value Ref Range   Specimen Description URINE, CLEAN CATCH     Special Requests NONE     Culture  Setup Time       Value: 05/05/2013 21:35     Performed at [facility]        Value: 35,000 COLONIES/ML     Performed at [facility]   Culture       Value: Multiple bacterial morphotypes present, none predominant. Suggest appropriate recollection if clinically indicated.     Performed at [facility]   Report Status 05/06/2013 FINAL    CBC WITH DIFFERENTIAL     Status: None   Collection Time    05/05/13 12:10 PM      Result Value Ref Range   WBC 4.1  4.0 - 10.5 K/uL   RBC 4.27  3.87 - 5.11 MIL/uL   Hemoglobin 12.1  12.0 - 15.0 g/dL   HCT 37.4  36.0 - 46.0 %   MCV 87.6  78.0 - 100.0 fL   MCH 28.3  26.0 - 34.0 pg   MCHC 32.4  30.0 - 36.0 g/dL   RDW 15.5  11.5 - 15.5 %   Platelets 208  150 - 400 K/uL   Neutrophils Relative % 53  43 - 77 %   Neutro Abs 2.2  1.7 - 7.7 K/uL   Lymphocytes Relative 37  12 - 46 %   Lymphs Abs 1.5  0.7 - 4.0 K/uL   Monocytes Relative 8  3 - 12 %   Monocytes Absolute 0.3  0.1 - 1.0 K/uL   Eosinophils Relative 1  0 - 5 %   Eosinophils Absolute 0.0  0.0 - 0.7 K/uL   Basophils Relative 1  0 - 1 %   Basophils Absolute 0.0  0.0 - 0.1 K/uL  PROTIME-INR     Status: Abnormal   Collection Time    05/05/13 12:10 PM      Result Value Ref Range   Prothrombin Time 20.6 (*) 11.6 - 15.2 seconds   INR 1.83 (*) 0.00 - 1.49  I-STAT CHEM 8, ED     Status: None   Collection Time    05/05/13 12:17 PM      Result Value Ref Range   Sodium 139  137 - 147 mEq/L   Potassium 3.9  3.7 - 5.3 mEq/L   Chloride 99  96 - 112 mEq/L   BUN 11  6 - 23 mg/dL   Creatinine, Ser 0.90  0.50 - 1.10 mg/dL   Glucose, Bld 75  70 - 99 mg/dL   Calcium, Ion 1.14  1.12 - 1.23 mmol/L   TCO2 29  0 - 100 mmol/L   Hemoglobin 13.9  12.0 - 15.0 g/dL   HCT 41.0  36.0 - 46.0 %  CBC WITH DIFFERENTIAL ([facility])     Status: Abnormal   Collection Time    05/16/13  3:36 PM      Result Value Ref Range   WBC 4.6  3.9 - 10.0 10e3/uL   RBC 4.24  3.70 - 5.32 10e6/uL   HGB 12.0  11.6 - 15.9 g/dL   HCT 38.6  34.8 - 46.6 %   MCV 91  81 - 101 fL   MCH 28.3   26.0 - 34.0 pg   MCHC 31.1 (*) 32.0 - 36.0 g/dL   RDW 15.7  11.1 - 15.7 %   Platelets 200  145 - 400 10e3/uL   NEUT# 2.5  1.5 - 6.5 10e3/uL   LYMPH# 1.7  0.9 - 3.3 10e3/uL   MONO# 0.4  0.1 - 0.9 10e3/uL   Eosinophils Absolute 0.1  0.0 - 0.5 10e3/uL   BASO# 0.0  0.0 - 0.2 10e3/uL   NEUT% 54.0  39.6 - 80.0 %   LYMPH% 36.0  14.0 - 48.0 %   MONO% 8.3  0.0 - 13.0 %   EOS% 1.3  0.0 - 7.0 %   BASO% 0.4  0.0 - 2.0 %  LUPUS ANTICOAGULANT PANEL     Status: Abnormal   Collection Time    05/16/13  3:36 PM      Result Value Ref Range   PTT Lupus Anticoagulant 49.7 (*) 28.0 - 43.0 secs   PTTLA 4:1 Mix 38.8  28.0 - 43.0 secs   PTTLA Confirmation NOT APPL  <8.0 secs   DRVVT 53.9 (*) <42.9 secs   DRVVT 1:1 Mix 36.3  <42.9 secs   Drvvt confirmation NOT APPL  <1.11 Ratio   Lupus Anticoagulant NOT DETECTED  NOT DETECTED  PROTHROMBIN TIME     Status: Abnormal   Collection Time    05/16/13  3:36 PM      Result Value Ref Range   Prothrombin Time 24.7 (*) 11.6 - 15.2 seconds   INR 2.30 (*) <1.50   Comment: The INR is of principal utility in following patients on stable dosesof oral anticoagulants.  The therapeutic range is generally 2.0 to3.0, but may be 3.0 to 4.0 in patients with mechanical cardiac valves,recurrent embolisms and antiphospholipid      antibodies (including lupusinhibitors).    Musculoskeletal: Strength & Muscle Tone: decreased Gait & Station: Patient is wheelchair-bound.  She has the right below-knee amputation. Patient leans: See above  Psychiatric: Speech (describe rate, volume, coherence, spontaneity, and abnormalities if any): Soft with decreased volume and tone.  At times incoherent and difficult organizing her thoughts.  Thought Process (describe rate, content, abstract reasoning, and computation): Circumstantial.  Associations: Intact, her fund of knowledge is average  Thoughts: rummination.   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  Patient is taking Lamictal , vimpat and Keppra.  She continues to have seizures but they are less frequent and intense ntense from the past.  Patient and her mother endorse Prozac 60 mg working very well.  She is taking 40 mg in the morning and 40 mg at bedtime.  She denies any tremors or shakes.  She denies any side effects.  I encouraged her to use prosthesis .  I will schedule appointment with practice in this office and I reinforced to keep the appointment her coping and social skills.  We also talked about physical therapy .  Patient will contact her primary care physician and neurologist for physical therapy.  She is seeing Dr. Frances Nickels .  Recommend to call us back if she has any question or any concern.  Followup in 3 months. Time spent 25 minutes.  More than 50% of the time spent in psychoeducation, counseling and coordination of care.  Discuss safety plan that anytime having active suicidal thoughts or homicidal thoughts then patient need to call 911 or go to the local emergency room.  Followup in 2 months.  Cassandra Laser T., MD 07/02/2013

## 2013-07-02 NOTE — Addendum Note (Signed)
Addended by: Berniece Andreas T on: 07/02/2013 03:50 PM   Modules accepted: Level of Service

## 2013-07-08 ENCOUNTER — Other Ambulatory Visit: Payer: Self-pay | Admitting: Nurse Practitioner

## 2013-07-09 ENCOUNTER — Ambulatory Visit (INDEPENDENT_AMBULATORY_CARE_PROVIDER_SITE_OTHER): Payer: Medicare Other | Admitting: Nurse Practitioner

## 2013-07-09 ENCOUNTER — Encounter: Payer: Self-pay | Admitting: Nurse Practitioner

## 2013-07-09 VITALS — BP 109/74 | HR 81

## 2013-07-09 DIAGNOSIS — G40909 Epilepsy, unspecified, not intractable, without status epilepticus: Secondary | ICD-10-CM

## 2013-07-09 DIAGNOSIS — G40919 Epilepsy, unspecified, intractable, without status epilepticus: Secondary | ICD-10-CM

## 2013-07-09 MED ORDER — LEVETIRACETAM 750 MG PO TABS: 1500.0000 mg | ORAL_TABLET | Freq: Two times a day (BID) | ORAL | Status: DC

## 2013-07-09 MED ORDER — LACOSAMIDE 200 MG PO TABS: 200.0000 mg | ORAL_TABLET | Freq: Two times a day (BID) | ORAL | Status: DC

## 2013-07-09 MED ORDER — LAMOTRIGINE 200 MG PO TABS: 400.0000 mg | ORAL_TABLET | Freq: Two times a day (BID) | ORAL | Status: DC

## 2013-07-09 NOTE — Patient Instructions (Addendum)
Continue to follow with Dr. Raj Janus at [facility], please have recent labs sent to [place]. Continue Keppra 750 mg 2 tablets twice daily Continue Lamictal 200 mg  2 tabs twice daily Continue Vimpat 200mg  twice daily Renew all meds Followup in 6 months

## 2013-07-09 NOTE — Progress Notes (Signed)
I have read the note, and I agree with the clinical assessment and plan.  Charles K Willis   

## 2013-07-09 NOTE — Progress Notes (Signed)
[facility]  PATIENT: Cassandra Andrade DOB: 13-May-1965   REASON FOR VISIT: Intractable seizure disorder    HISTORY OF PRESENT ILLNESS:Ms Headen, 48 year old female returns for followup with her mother. She has a history of intractable seizures. The patient has a history of a cerebral aneurysm and a right brain stroke. The patient has been on Lamictal and Vimpat, but the seizures have not been well controlled so her Keppra was increased by Dr. [name] at last visit on 02/28/13. The patient has had video EEG monitoring that has confirmed the presence of subclinical seizures as well as clinical seizures without EEG correlates. It is felt that these events are stereotypical, and likely represent focal seizures without EEG correlates. The patient has been in the hospital on 2 occasions in December, once on December 27, and again on December 31. The patient has been placed on Keppra, currently on 1500mg  mg twice daily. Her seizure frequency is now weekly or a little less.  The patient may have episodes of agitation, and slurred speech. The patient is eating well. The patient is nonambulatory, getting around in a wheelchair. The patient returns to this office for an evaluation. Recent labs by Dr. Raj [facility]us at [facility]. The mother says she is hoping the irritable focas can be removed, she has a MRI scheduled of the brain at [facility]: Full 14 system review of systems performed and notable only for those listed, all others are neg:  Constitutional: N/A  Cardiovascular: N/A  Ear/Nose/Throat: N/A  Skin: N/A  Eyes: N/A  Respiratory: N/A  Gastroitestinal: N/A  Hematology/Lymphatic: N/A  Endocrine: N/A Musculoskeletal: Above knee amputation on the right  Allergy/Immunology: N/A  Neurological: Memory loss, seizure  Psychiatric: Depression treated by psych  Sleep : NA   ALLERGIES: Allergies  Allergen Reactions  . Vicodin [Hydrocodone-Acetaminophen] Nausea And  Vomiting  . Morphine Rash  . Morphine And Related Rash    Allergic reaction only in iv form    HOME MEDICATIONS: Outpatient Prescriptions Prior to Visit  Medication Sig Dispense Refill  . acetaminophen-codeine (TYLENOL #3) 300-30 MG per tablet Take 1 tablet by mouth.      . Ascorbic Acid (VITAMIN C) 1000 MG tablet Take 1,000 mg by mouth daily.       . Calcium Carbonate-Vitamin D 600-400 MG-UNIT per tablet Take 1 tablet by mouth daily.       . ferrous sulfate 325 (65 FE) MG tablet Take 325 mg by mouth daily with breakfast.      . FLUoxetine (PROZAC) 20 MG capsule Take 1-2 capsules (20-40 mg total) by mouth 2 (two) times daily. Take 20mg  in the morning and 40mg  in the evening  90 capsule  2  . lacosamide (VIMPAT) 200 MG TABS tablet Take 1 tablet (200 mg total) by mouth 2 (two) times daily.  60 tablet  5  . lamoTRIgine (LAMICTAL) 200 MG tablet Take 400 mg by mouth 2 (two) times daily.       [name] levETIRAcetam (KEPPRA) 750 MG tablet Take 2 tablets (1,500 mg total) by mouth 2 (two) times daily.  120 tablet  0  . levothyroxine (SYNTHROID, LEVOTHROID) 50 MCG tablet Take 50 mcg by mouth daily before breakfast.      . promethazine (PHENERGAN) 25 MG tablet Take 25 mg by mouth every 6 (six) hours as needed. nausea      . traMADol (ULTRAM) 50 MG tablet Take 1 tablet (50 mg total) by mouth every 6 (six) hours  as needed.  60 tablet  3  . warfarin (COUMADIN) 5 MG tablet Take 10 mg by mouth every morning.        No facility-administered medications prior to visit.    PAST MEDICAL HISTORY: Past Medical History  Diagnosis Date  . Anxiety   . Depression   . Seizures   . CVA (cerebrovascular accident due to intracerebral hemorrhage) 2004  . Arthritis   . MRSA (methicillin resistant Staphylococcus aureus)   . S/P BKA (below knee amputation) unilateral     Right   . Thyroid disease   . Cancer     Osteosarcoma, right leg  . 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 DVT (deep venous  thrombosis)     Left leg    PAST SURGICAL HISTORY: Past Surgical History  Procedure Laterality Date  . Fracture surgery      Rt. femur, folllowed by MRSA  . Brain surgery  2004    cerebral aneurysm  . Total knee arthroplasty Right   . Above knee leg amputation Right     Osteosarcoma    FAMILY HISTORY: Family History  Problem Relation Age of Onset  . Anxiety disorder Mother   . Diabetes Father     SOCIAL HISTORY: History   Social History  . Marital Status: Married    Spouse Name: N/A    Number of Children: 3  . Years of Education: GED   Occupational History  . Not on file.   Social History Main Topics  . Smoking status: Never Smoker   . Smokeless tobacco: Never Used     Comment: never used tobacco  . Alcohol Use: No  . Drug Use: No  . Sexual Activity: Not Currently   Other Topics Concern  . Not on file   Social History Narrative   Patient lives at home with mom.    Patient has GED.    Patient has 3 children.    Patient is married but in the process of divorced.            PHYSICAL EXAM  Filed Vitals:   07/09/13 1506  BP: 109/74  Pulse: 81   Cannot calculate BMI with a height equal to zero. General: The patient is alert and cooperative at the time of the examination.  Skin: No significant peripheral edema is noted.  Neurologic Exam  Mental status: The patient is oriented x 3.  Cranial nerves: Facial symmetry is present. Speech is normal, no aphasia or dysarthria is noted. Extraocular movements are notable for restriction of superior gaze. On primary gaze, there is exotropia of the left eye. Visual fields are full.  Motor: The patient has good strength in all 4 extremities.  Sensory examination: Soft touch sensation on the legs, arms, and face is symmetric  Coordination: The patient has good finger-nose-finger and heel-to-shin bilaterally.  Gait and station: The patient has a Right above-knee amputation, no prosthesis. The patient could not be  ambulated.  Reflexes: Deep tendon reflexes are symmetric in the arms, cannot get reflexes on the right leg secondary to above-knee amputation.    DIAGNOSTIC DATA (LABS, IMAGING, TESTING) - I reviewed patient records, labs, notes, testing and imaging myself where available.  Lab Results  Component Value Date   WBC 4.6 05/16/2013   HGB 12.0 05/16/2013   HCT 38.6 05/16/2013   MCV 91 05/16/2013   PLT 200 05/16/2013      Component Value Date/Time   NA 139 05/05/2013 1217   NA  139 10/24/2012 1031   K 3.9 05/05/2013 1217   CL 99 05/05/2013 1217   CO2 23 02/22/2013 0455   GLUCOSE 75 05/05/2013 1217   GLUCOSE 75 10/24/2012 1031   BUN 11 05/05/2013 1217   BUN 11 10/24/2012 1031   CREATININE 0.90 05/05/2013 1217   CALCIUM 8.8 02/22/2013 0455   CALCIUM 8.4 12/26/2009 1025   PROT 7.7 02/17/2013 0313   PROT 6.9 10/24/2012 1031   ALBUMIN 3.8 02/17/2013 0313   AST 26 02/17/2013 0313   ALT 33 02/17/2013 0313   ALKPHOS 105 02/17/2013 0313   BILITOT 0.2* 02/17/2013 0313   GFRNONAA 75* 02/22/2013 0455   GFRAA 86* 02/22/2013 0455     Lab Results  Component Value Date   TSH 2.417 02/17/2013     ASSESSMENT AND PLAN  48 y.o. year old female  has a past medical history of cerebral aneurysm and right brain stroke. Intractable epilepsy currently on 3 medications  Continue to follow with Dr. Raj Janus at [facility], please have recent labs sent to [place]. Given information on Vagal nerve stimulator.  Continue Keppra 750 mg 2 tablets twice daily Continue Lamictal 200 mg  2 tabs twice daily Continue Vimpat 200mg  twice daily Renew all meds Followup in 6 months Dennie Bible, [facility], [facility], APRN  [facility] [address], [place], [place] [address] [phone]

## 2013-07-11 ENCOUNTER — Ambulatory Visit (HOSPITAL_BASED_OUTPATIENT_CLINIC_OR_DEPARTMENT_OTHER): Payer: Medicare Other | Admitting: Hematology & Oncology

## 2013-07-11 ENCOUNTER — Encounter: Payer: Self-pay | Admitting: Hematology & Oncology

## 2013-07-11 ENCOUNTER — Other Ambulatory Visit (HOSPITAL_BASED_OUTPATIENT_CLINIC_OR_DEPARTMENT_OTHER): Payer: Medicare Other | Admitting: Lab

## 2013-07-11 VITALS — BP 105/66 | HR 79 | Temp 98.3°F | Resp 14 | Ht 66.0 in | Wt 180.0 lb

## 2013-07-11 DIAGNOSIS — I82409 Acute embolism and thrombosis of unspecified deep veins of unspecified lower extremity: Secondary | ICD-10-CM

## 2013-07-11 DIAGNOSIS — G894 Chronic pain syndrome: Secondary | ICD-10-CM

## 2013-07-11 LAB — CBC WITH DIFFERENTIAL (CANCER CENTER ONLY)
BASO#: 0 10*3/uL (ref 0.0–0.2)
BASO%: 0.3 % (ref 0.0–2.0)
EOS%: 1.2 % (ref 0.0–7.0)
Eosinophils Absolute: 0 10*3/uL (ref 0.0–0.5)
HEMATOCRIT: 38.1 % (ref 34.8–46.6)
HGB: 12.2 g/dL (ref 11.6–15.9)
LYMPH#: 1.6 10*3/uL (ref 0.9–3.3)
LYMPH%: 46.2 % (ref 14.0–48.0)
MCH: 29.4 pg (ref 26.0–34.0)
MCHC: 32 g/dL (ref 32.0–36.0)
MCV: 92 fL (ref 81–101)
MONO#: 0.3 10*3/uL (ref 0.1–0.9)
MONO%: 9.8 % (ref 0.0–13.0)
NEUT#: 1.5 10*3/uL (ref 1.5–6.5)
NEUT%: 42.5 % (ref 39.6–80.0)
Platelets: 185 10*3/uL (ref 145–400)
RBC: 4.15 10*6/uL (ref 3.70–5.32)
RDW: 14 % (ref 11.1–15.7)
WBC: 3.5 10*3/uL — ABNORMAL LOW (ref 3.9–10.0)

## 2013-07-11 LAB — PROTIME-INR (CHCC SATELLITE)

## 2013-07-11 LAB — CMP (CANCER CENTER ONLY)
ALK PHOS: 102 U/L — AB (ref 26–84)
ALT(SGPT): 27 U/L (ref 10–47)
AST: 26 U/L (ref 11–38)
Albumin: 3.4 g/dL (ref 3.3–5.5)
BILIRUBIN TOTAL: 0.4 mg/dL (ref 0.20–1.60)
BUN: 16 mg/dL (ref 7–22)
CO2: 31 mEq/L (ref 18–33)
CREATININE: 1 mg/dL (ref 0.6–1.2)
Calcium: 10 mg/dL (ref 8.0–10.3)
Chloride: 97 mEq/L — ABNORMAL LOW (ref 98–108)
GLUCOSE: 126 mg/dL — AB (ref 73–118)
Potassium: 3.6 mEq/L (ref 3.3–4.7)
Sodium: 138 mEq/L (ref 128–145)
Total Protein: 7.6 g/dL (ref 6.4–8.1)

## 2013-07-11 LAB — PROTHROMBIN TIME
INR: 2.04 — AB (ref <1.50)
Prothrombin Time: 22.4 seconds — ABNORMAL HIGH (ref 11.6–15.2)

## 2013-07-11 LAB — D-DIMER, QUANTITATIVE: D-Dimer, Quant: 0.26 ug/mL-FEU (ref 0.00–0.48)

## 2013-07-11 NOTE — Progress Notes (Signed)
Hematology and Oncology Follow Up Visit  Cassandra Andrade [id number] [birth year] 48 y.o. 07/11/2013   Principle Diagnosis:  Bilateral lower extremity deep venous thrombosis. 2. Right below-knee amputation for osteogenic sarcoma. 3. Multiple miscarriages. 4. Positive lupus anticoagulant.  Current Therapy:   Coumadin to maintain INR between 2-3.     Interim History:  Ms.  Andrade is is back for followup. She is having some issues with seizures. She's been seen at [facility]. She is due for an MRI. They may do a special procedure to minimize her seizures. She did have another seizure since we saw her. She was hospitalized for this. There may be some medicine adjustment to try to help with the seizure. She is on Keppra.  Because of her seizures, we do not have her on one of the new oral anticoagulants.  She's having her Coumadin checked weekly.  Her appetite is good. She's gaining some weight. She does have some pain in the right leg where she had the". I gave them a prescription for Ultram. This is helping her pain.  There's been no cough. She's had no fever.  Medications: Current outpatient prescriptions:Ascorbic Acid (VITAMIN C) 1000 MG tablet, Take 1,000 mg by mouth daily. , Disp: , Rfl: ;  Calcium Carbonate-Vitamin D 600-400 MG-UNIT per tablet, Take 1 tablet by mouth daily. , Disp: , Rfl: ;  ferrous sulfate 325 (65 FE) MG tablet, Take 325 mg by mouth daily with breakfast., Disp: , Rfl:  FLUoxetine (PROZAC) 20 MG capsule, Take 1-2 capsules (20-40 mg total) by mouth 2 (two) times daily. Take 20mg  in the morning and 40mg  in the evening, Disp: 90 capsule, Rfl: 2;  lacosamide (VIMPAT) 200 MG TABS tablet, Take 1 tablet (200 mg total) by mouth 2 (two) times daily., Disp: 60 tablet, Rfl: 5;  lamoTRIgine (LAMICTAL) 200 MG tablet, Take 2 tablets (400 mg total) by mouth 2 (two) times daily., Disp: 120 tablet, Rfl: 6 levETIRAcetam (KEPPRA) 750 MG tablet, Take 2 tablets (1,500 mg total) by mouth 2 (two)  times daily., Disp: 120 tablet, Rfl: 6;  levothyroxine (SYNTHROID, LEVOTHROID) 50 MCG tablet, Take 50 mcg by mouth daily before breakfast., Disp: , Rfl: ;  promethazine (PHENERGAN) 25 MG tablet, Take 25 mg by mouth every 6 (six) hours as needed. nausea, Disp: , Rfl:  traMADol (ULTRAM) 50 MG tablet, Take 1 tablet (50 mg total) by mouth every 6 (six) hours as needed., Disp: 60 tablet, Rfl: 3;  warfarin (COUMADIN) 5 MG tablet, Take 10 mg by mouth every morning. , Disp: , Rfl:   Allergies:  Allergies  Allergen Reactions  . Vicodin [Hydrocodone-Acetaminophen] Nausea And Vomiting  . Morphine Rash  . Morphine And Related Rash    Allergic reaction only in iv form    Past Medical History, Surgical history, Social history, and Family History were reviewed and updated.  Review of Systems: As above  Physical Exam:  height is 5\' 6"  (1.676 m) and weight is 180 lb (81.647 kg). Her oral temperature is 98.3 F (36.8 C). Her blood pressure is 105/66 and her pulse is 79. Her respiration is 14.   Well-developed and well-nourished African American female. Her lungs are clear. Head and neck exam shows no ocular or oral lesions. There is no adenopathy in her neck. Cardiac exam regular rate rhythm with no murmurs rubs or bruits. Abdomen soft. She's good bowel sounds. There is no palpable liver or spleen tip. Extremities shows the right below knee amputation. Left leg does not show any  swelling. No venous cord is noted. She has good range of motion of the left leg. Skin exam no rashes. No ecchymosis or petechia. Neurological exam is nonfocal.  Lab Results  Component Value Date   WBC 3.5* 07/11/2013   HGB 12.2 07/11/2013   HCT 38.1 07/11/2013   MCV 92 07/11/2013   PLT 185 07/11/2013     Chemistry      Component Value Date/Time   NA 138 07/11/2013 1346   NA 139 05/05/2013 1217   NA 139 10/24/2012 1031   K 3.6 07/11/2013 1346   K 3.9 05/05/2013 1217   CL 97* 07/11/2013 1346   CL 99 05/05/2013 1217   CO2 31 07/11/2013  1346   CO2 23 02/22/2013 0455   BUN 16 07/11/2013 1346   BUN 11 05/05/2013 1217   BUN 11 10/24/2012 1031   CREATININE 1.0 07/11/2013 1346   CREATININE 0.90 05/05/2013 1217      Component Value Date/Time   CALCIUM 10.0 07/11/2013 1346   CALCIUM 8.8 02/22/2013 0455   CALCIUM 8.4 12/26/2009 1025   ALKPHOS 102* 07/11/2013 1346   ALKPHOS 105 02/17/2013 0313   AST 26 07/11/2013 1346   AST 26 02/17/2013 0313   ALT 27 07/11/2013 1346   ALT 33 02/17/2013 0313   BILITOT 0.40 07/11/2013 1346   BILITOT 0.2* 02/17/2013 0313         Impression and Plan: Cassandra Andrade is a 48 her old Afro-American female. She has a lupus anticoagulant. The last time she was here, we Isoject her and a lupus anticoagulant test was negative. However, I still think that her having a lupus anticoagulant is significant.    I think she's going to need to be on long-term anticoagulation. This think that she is at significant risk for thrombotic episodes if we got her off anticoagulation.  I want to do another Doppler on her left leg in about 2 months.  We'll see her back in 2 months. We will see her the same day that we do her Doppler     Volanda Napoleon, MD 5/20/20152:57 PM

## 2013-07-17 DIAGNOSIS — Z9229 Personal history of other drug therapy: Secondary | ICD-10-CM | POA: Insufficient documentation

## 2013-07-17 DIAGNOSIS — Z7901 Long term (current) use of anticoagulants: Secondary | ICD-10-CM | POA: Insufficient documentation

## 2013-07-27 ENCOUNTER — Ambulatory Visit (INDEPENDENT_AMBULATORY_CARE_PROVIDER_SITE_OTHER): Payer: Medicare Other | Admitting: Psychology

## 2013-07-27 DIAGNOSIS — F32A Depression, unspecified: Secondary | ICD-10-CM

## 2013-07-27 DIAGNOSIS — F3289 Other specified depressive episodes: Secondary | ICD-10-CM

## 2013-07-27 DIAGNOSIS — F329 Major depressive disorder, single episode, unspecified: Secondary | ICD-10-CM

## 2013-07-27 NOTE — Progress Notes (Signed)
   THERAPIST PROGRESS NOTE  Session Time: 2.35pm-3.20pm  Participation Level: Active  Behavioral Response: Well GroomedAlertDepressed  Type of Therapy: Individual Therapy  Treatment Goals addressed: Diagnosis: Depressive D/O NOS and coping w/ disabilities  Interventions: CBT and Supportive  Summary: Cassandra Andrade is a 48 y.o. female who presents with depressed mood and affect.  Pt was initially confused about who she was meeting w/ and for what services- talking about her seizures and tx for seizures.  Pt discussed that she feels that she is stuck in ways w/ not taking steps towards greater independence.  Pt reports she doesn't use her prosthesis and needs physical therapy to regain walking w/ this.  Pt states she can't get her prosthesis on anymore as well. Pt acknowledges that she needs someone to push her in an encouraging way not that she has given up and wants to be in a wheelchair like she reports mom states to her.  Pt states she wants counselor to reiterate this w/ her mother so together they can f/u w/ PCP referring for physical therapy.  Mom discussed was getting to look at having someone just teach her to use crutches, but receptive to counselor informing that her doctor needs to assess and refer for appropriate tx.   was reminded that we discussed at last session and that .   Suicidal/Homicidal: Nowithout intent/plan  Therapist Response: Assessed pt current functioning per pt report.  Discussed pt recent medical problems and stressors caused. Assisted pt in reframing negative thinking and making encouraging statements to self and identifying next steps.  Explored w/pt f/u w/ physical therapy and discussed that working through PCP is the right step for this.  Reiterated this w/ her mother and discussed want to f/u w/ this at next session.  Plan: Return again in 1 month.  Diagnosis: Axis I: Depressive Disorder NOS    Axis II: No diagnosis    Dimitrious Micciche, LPC 07/27/2013

## 2013-08-05 DIAGNOSIS — R32 Unspecified urinary incontinence: Secondary | ICD-10-CM | POA: Insufficient documentation

## 2013-08-13 ENCOUNTER — Emergency Department (HOSPITAL_COMMUNITY)
Admission: EM | Admit: 2013-08-13 | Discharge: 2013-08-13 | Disposition: A | Discharge status: Home / Self Care | Payer: Medicare Other | Attending: Emergency Medicine | Admitting: Emergency Medicine

## 2013-08-13 ENCOUNTER — Emergency Department (HOSPITAL_COMMUNITY): Payer: Medicare Other

## 2013-08-13 ENCOUNTER — Encounter (HOSPITAL_COMMUNITY): Payer: Self-pay | Admitting: Emergency Medicine

## 2013-08-13 ENCOUNTER — Telehealth: Payer: Self-pay | Admitting: Nurse Practitioner

## 2013-08-13 DIAGNOSIS — X500XXA Overexertion from strenuous movement or load, initial encounter: Secondary | ICD-10-CM | POA: Insufficient documentation

## 2013-08-13 DIAGNOSIS — Z8614 Personal history of Methicillin resistant Staphylococcus aureus infection: Secondary | ICD-10-CM | POA: Insufficient documentation

## 2013-08-13 DIAGNOSIS — Z85830 Personal history of malignant neoplasm of bone: Secondary | ICD-10-CM | POA: Insufficient documentation

## 2013-08-13 DIAGNOSIS — Y9289 Other specified places as the place of occurrence of the external cause: Secondary | ICD-10-CM | POA: Insufficient documentation

## 2013-08-13 DIAGNOSIS — Z79899 Other long term (current) drug therapy: Secondary | ICD-10-CM | POA: Insufficient documentation

## 2013-08-13 DIAGNOSIS — Z8673 Personal history of transient ischemic attack (TIA), and cerebral infarction without residual deficits: Secondary | ICD-10-CM | POA: Insufficient documentation

## 2013-08-13 DIAGNOSIS — Z8739 Personal history of other diseases of the musculoskeletal system and connective tissue: Secondary | ICD-10-CM | POA: Insufficient documentation

## 2013-08-13 DIAGNOSIS — S99929A Unspecified injury of unspecified foot, initial encounter: Principal | ICD-10-CM

## 2013-08-13 DIAGNOSIS — G40909 Epilepsy, unspecified, not intractable, without status epilepticus: Secondary | ICD-10-CM | POA: Insufficient documentation

## 2013-08-13 DIAGNOSIS — E079 Disorder of thyroid, unspecified: Secondary | ICD-10-CM | POA: Insufficient documentation

## 2013-08-13 DIAGNOSIS — F411 Generalized anxiety disorder: Secondary | ICD-10-CM | POA: Insufficient documentation

## 2013-08-13 DIAGNOSIS — R569 Unspecified convulsions: Secondary | ICD-10-CM | POA: Insufficient documentation

## 2013-08-13 DIAGNOSIS — Z86718 Personal history of other venous thrombosis and embolism: Secondary | ICD-10-CM | POA: Insufficient documentation

## 2013-08-13 DIAGNOSIS — F3289 Other specified depressive episodes: Secondary | ICD-10-CM | POA: Insufficient documentation

## 2013-08-13 DIAGNOSIS — Y9389 Activity, other specified: Secondary | ICD-10-CM | POA: Insufficient documentation

## 2013-08-13 DIAGNOSIS — F329 Major depressive disorder, single episode, unspecified: Secondary | ICD-10-CM | POA: Insufficient documentation

## 2013-08-13 DIAGNOSIS — S99919A Unspecified injury of unspecified ankle, initial encounter: Principal | ICD-10-CM

## 2013-08-13 DIAGNOSIS — S88119A Complete traumatic amputation at level between knee and ankle, unspecified lower leg, initial encounter: Secondary | ICD-10-CM | POA: Insufficient documentation

## 2013-08-13 DIAGNOSIS — S8990XA Unspecified injury of unspecified lower leg, initial encounter: Secondary | ICD-10-CM | POA: Insufficient documentation

## 2013-08-13 DIAGNOSIS — Z7901 Long term (current) use of anticoagulants: Secondary | ICD-10-CM | POA: Insufficient documentation

## 2013-08-13 DIAGNOSIS — M25562 Pain in left knee: Secondary | ICD-10-CM

## 2013-08-13 MED ORDER — ONDANSETRON 4 MG PO TBDP
4.0000 mg | ORAL_TABLET | Freq: Once | ORAL | Status: AC
  Administered 2013-08-13: 4 mg via ORAL
  Filled 2013-08-13: qty 1

## 2013-08-13 MED ORDER — ONDANSETRON 4 MG PO TBDP
4.0000 mg | ORAL_TABLET | Freq: Once | ORAL | Status: DC
Start: 2013-08-13 — End: 2013-12-20

## 2013-08-13 MED ORDER — OXYCODONE-ACETAMINOPHEN 5-325 MG PO TABS
1.0000 | ORAL_TABLET | Freq: Once | ORAL | Status: AC
  Administered 2013-08-13: 1 via ORAL
  Filled 2013-08-13: qty 1

## 2013-08-13 MED ORDER — OXYCODONE-ACETAMINOPHEN 5-325 MG PO TABS: 1.0000 | ORAL_TABLET | Freq: Four times a day (QID) | ORAL | Status: DC | PRN

## 2013-08-13 MED ORDER — LACOSAMIDE 200 MG PO TABS
200.0000 mg | ORAL_TABLET | Freq: Two times a day (BID) | ORAL | Status: DC
  Administered 2013-08-13: 200 mg via ORAL
  Filled 2013-08-13: qty 1

## 2013-08-13 NOTE — ED Provider Notes (Signed)
CSN: [id number]     Arrival date & time 08/13/13  0003 History   First MD Initiated Contact with Patient 08/13/13 0139     Chief Complaint  Patient presents with  . Knee Injury     (Consider location/radiation/quality/duration/timing/severity/associated sxs/prior Treatment) HPI Comments: Patient is an AKA of the right leg.  She stood on her left leg in the bathroom, went to pick at and twisted her knee.  Presents now with knee pain.  Has taken Ultram, x1, for discomfort, without, relief  The history is provided by the patient.    Past Medical History  Diagnosis Date  . Anxiety   . Depression   . Seizures   . CVA (cerebrovascular accident due to intracerebral hemorrhage) 2004  . Arthritis   . MRSA (methicillin resistant Staphylococcus aureus)   . S/P BKA (below knee amputation) unilateral     Right   . Thyroid disease   . Cancer     Osteosarcoma, right leg  . 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 DVT (deep venous thrombosis)     Left leg   Past Surgical History  Procedure Laterality Date  . Fracture surgery      Rt. femur, folllowed by MRSA  . Brain surgery  2004    cerebral aneurysm  . Total knee arthroplasty Right   . Above knee leg amputation Right     Osteosarcoma   Family History  Problem Relation Age of Onset  . Anxiety disorder Mother   . Diabetes Father    History  Substance Use Topics  . Smoking status: Never Smoker   . Smokeless tobacco: Never Used     Comment: never used tobacco  . Alcohol Use: No   OB History   Grav Para Term Preterm Abortions TAB SAB Ect Mult Living                 Review of Systems  Constitutional: Negative for fever.  Musculoskeletal: Positive for arthralgias and joint swelling.  Neurological: Negative for dizziness.  All other systems reviewed and are negative.     Allergies  Vicodin; Morphine; and Morphine and related  Home Medications   Prior to Admission medications   Medication Sig Start  Date End Date Taking? Authorizing Provider  Ascorbic Acid (VITAMIN C) 1000 MG tablet Take 1,000 mg by mouth daily.  08/17/11  Yes Historical Provider, MD  Calcium Carbonate-Vitamin D 600-400 MG-UNIT per tablet Take 1 tablet by mouth daily.    Yes Historical Provider, MD  ferrous sulfate 325 (65 FE) MG tablet Take 325 mg by mouth daily with breakfast.   Yes Historical Provider, MD  FLUoxetine (PROZAC) 20 MG capsule Take 20-40 mg by mouth See admin instructions. Take 1 capsule in the morning and 2 capsules in the evening.   Yes Historical Provider, MD  lacosamide (VIMPAT) 200 MG TABS tablet Take 1 tablet (200 mg total) by mouth 2 (two) times daily. 07/09/13  Yes Dennie Bible, NP  lamoTRIgine (LAMICTAL) 200 MG tablet Take 2 tablets (400 mg total) by mouth 2 (two) times daily. 07/09/13  Yes Dennie Bible, NP  levETIRAcetam (KEPPRA) 750 MG tablet Take 2 tablets (1,500 mg total) by mouth 2 (two) times daily. 07/09/13  Yes Dennie Bible, NP  levothyroxine (SYNTHROID, LEVOTHROID) 50 MCG tablet Take 50 mcg by mouth daily before breakfast.   Yes Historical Provider, MD  promethazine (PHENERGAN) 25 MG tablet Take 25 mg by mouth every 6 (six) hours as needed.  nausea   Yes Historical Provider, MD  traMADol (ULTRAM) 50 MG tablet Take 1 tablet (50 mg total) by mouth every 6 (six) hours as needed. 05/16/13  Yes Volanda Napoleon, MD  warfarin (COUMADIN) 5 MG tablet Take 5 mg by mouth 2 (two) times daily.   Yes Historical Provider, MD  ondansetron (ZOFRAN-ODT) 4 MG disintegrating tablet Take 1 tablet (4 mg total) by mouth once. 08/13/13   Garald Balding, NP  oxyCODONE-acetaminophen (PERCOCET/ROXICET) 5-325 MG per tablet Take 1 tablet by mouth every 6 (six) hours as needed for severe pain. 08/13/13   Garald Balding, NP   BP 118/75  Pulse 71  Temp(Src) 97.8 F (36.6 C) (Oral)  Resp 18  Ht 5\' 7"  (1.702 m)  Wt 175 lb (79.379 kg)  BMI 27.40 kg/m2  SpO2 100% Physical Exam  Nursing note and vitals  reviewed. Constitutional: She appears well-developed and well-nourished.  Neck: Normal range of motion.  Cardiovascular: Normal rate.   Abdominal: Soft.  Musculoskeletal: She exhibits tenderness. She exhibits no edema.       Legs:   ED Course  Procedures (including critical care time) Labs Review Labs Reviewed - No data to display  Imaging Review Dg Knee Complete 4 Views Left  08/13/2013   CLINICAL DATA:  Left anterior and lateral knee pain with weight-bearing and flexion and extension. Twisting injury.  EXAM: LEFT KNEE - COMPLETE 4+ VIEW  COMPARISON:  None.  FINDINGS: Degenerative changes in the left knee with evidence of chondrocalcinosis in the medial and lateral compartments. Of old appearing ununited ossicle over the medial femoral condyle. No evidence of acute fracture or dislocation. No significant effusion. No destructive bone lesions.  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 No acute fractures identified.   Electronically Signed   By: Lucienne Capers M.D.   On: 08/13/2013 02:14     EKG Interpretation None      MDM  Patient's x-ray, shows she has significant arthritis in the joint and placed in a knee sleeve given a prescription for Zofran, and Percocet, that she can use in combination for pain relief.  She's also been instructed to follow up with her primary care physician Final diagnoses:  Knee pain, acute, left         Garald Balding, NP 08/13/13 0246

## 2013-08-13 NOTE — Discharge Instructions (Signed)
The x-ray of the knee shows that you have some arthritis in the joint.  He been placed in a knee sleeve for support and given a prescription for Percocet along with Zofran that, you can use if it causes nausea.  Please make an appointment, your primary care physician for further evaluation and followup

## 2013-08-13 NOTE — Telephone Encounter (Signed)
Mother stated patient has a very bad headache with nausea.  She was given Oxycodone and headache has not subsided.  Please call and advise.

## 2013-08-13 NOTE — ED Notes (Signed)
The pt twisted her lt knee earlier tonight when she was moving.  C/o pain still.  She reportedly has blood clots in both her legs

## 2013-08-13 NOTE — ED Notes (Signed)
Olean Ree, MD at bedside.

## 2013-08-14 NOTE — Telephone Encounter (Signed)
Spoke with mother and she said that patient had gone to ER for knee and developed headache after coming home. Did not want to go back to ER because such a long wait. Informed patient that she should contact her PCP since the headaches have not been addressed by Dr [name] or Ms Judeth Porch understood and said that she would call him.

## 2013-08-14 NOTE — ED Provider Notes (Signed)
Medical screening examination/treatment/procedure(s) were performed by non-physician practitioner and as supervising physician I was immediately available for consultation/collaboration.   EKG Interpretation None        Alfonzo Feller, DO 08/14/13 1814

## 2013-08-23 ENCOUNTER — Ambulatory Visit (HOSPITAL_COMMUNITY): Payer: Self-pay | Admitting: Psychology

## 2013-09-03 ENCOUNTER — Encounter (HOSPITAL_COMMUNITY): Payer: Self-pay | Admitting: Psychiatry

## 2013-09-03 ENCOUNTER — Ambulatory Visit (INDEPENDENT_AMBULATORY_CARE_PROVIDER_SITE_OTHER): Payer: Medicare Other | Admitting: Psychiatry

## 2013-09-03 ENCOUNTER — Ambulatory Visit (HOSPITAL_COMMUNITY): Payer: Self-pay | Admitting: Psychiatry

## 2013-09-03 VITALS — BP 104/70 | HR 84

## 2013-09-03 DIAGNOSIS — F321 Major depressive disorder, single episode, moderate: Secondary | ICD-10-CM

## 2013-09-03 MED ORDER — FLUOXETINE HCL 20 MG PO CAPS: 20.0000 mg | ORAL_CAPSULE | ORAL | Status: DC

## 2013-09-03 NOTE — Progress Notes (Signed)
[facility] [id number] Progress Note  Cassandra Andrade [id number] 48 y.o.  09/03/2013 3:11 PM  Chief Complaint:  Medication management and followup.      History of Present Illness: Cassandra Andrade came for her followup appointment.  She is compliant with her psychotropic medication.  She denies any irritability, anger or any mood swing.  She felt relieved after she had a closure with her ex husband last Friday.  Patient feels more calm since she had that visit.  She believed her peers are in good hands and she is happy about the kids.  She sleeping better.  She continues to go for physical therapy and she has not started using prosthesis.  Patient denies any agitation, hallucinations, paranoia or any feeling of hopelessness or worthlessness.  She is not drinking or using any illegal substances.  Her appetite is okay.  Her vitals are stable.  She continues to have seizure but they're less frequent and less intense from the past.  Patient lives with her mother who is very supportive.  She has not seen Cassandra Andrade in a while but to like to restart counseling again.  Suicidal Ideation: Yes Plan Formed: No Patient has means to carry out plan: No  Homicidal Ideation: No Plan Formed: No Patient has means to carry out plan: No  Review of Systems  Musculoskeletal: Positive for back pain.  Neurological: Positive for focal weakness and seizures.  Psychiatric/Behavioral: Positive for memory loss. Negative for hallucinations and substance abuse. The patient is nervous/anxious.     Psychiatric: Agitation: No Hallucination: No Depressed Mood: Yes Insomnia: Yes Hypersomnia: No Altered Concentration: No Feels Worthless: No Grandiose Ideas: No Belief In Special Powers: No New/Increased Substance Abuse: No Compulsions: No  Neurologic: Headache: Yes Seizure: Yes Paresthesias: Yes  Past Medical Family, Social History:  Patient has a history of cerebrovascular accident due to brain hemorrhage.  She has  history of MRSA resulted right knee amputation.  She has hypothyroidism, chronic neuropathy pain , headache and seizure disorder.  She see Dr. Jannifer Andrade for seizures.  Her primary care is Cassandra Andrade at [place].      Outpatient Encounter Prescriptions as of 09/03/2013  Medication Sig  . Ascorbic Acid (VITAMIN C) 1000 MG tablet Take 1,000 mg by mouth daily.   . Calcium Carbonate-Vitamin D 600-400 MG-UNIT per tablet Take 1 tablet by mouth daily.   . ferrous sulfate 325 (65 FE) MG tablet Take 325 mg by mouth daily with breakfast.  . FLUoxetine (PROZAC) 20 MG capsule Take 1 capsule (20 mg total) by mouth See admin instructions. Take 1 capsule in the morning and 2 capsules in the evening.  . lacosamide (VIMPAT) 200 MG TABS tablet Take 1 tablet (200 mg total) by mouth 2 (two) times daily.  [name] lamoTRIgine (LAMICTAL) 200 MG tablet Take 2 tablets (400 mg total) by mouth 2 (two) times daily.  [name] levETIRAcetam (KEPPRA) 750 MG tablet Take 2 tablets (1,500 mg total) by mouth 2 (two) times daily.  [name] levothyroxine (SYNTHROID, LEVOTHROID) 50 MCG tablet Take 50 mcg by mouth daily before breakfast.  . ondansetron (ZOFRAN-ODT) 4 MG disintegrating tablet Take 1 tablet (4 mg total) by mouth once.  [name] oxyCODONE-acetaminophen (PERCOCET/ROXICET) 5-325 MG per tablet Take 1 tablet by mouth every 6 (six) hours as needed for severe pain.  . promethazine (PHENERGAN) 25 MG tablet Take 25 mg by mouth every 6 (six) hours as needed. nausea  . traMADol (ULTRAM) 50 MG tablet Take 1 tablet (50 mg total) by  mouth every 6 (six) hours as needed.  . warfarin (COUMADIN) 5 MG tablet Take 5 mg by mouth 2 (two) times daily.  . [DISCONTINUED] FLUoxetine (PROZAC) 20 MG capsule Take 20-40 mg by mouth See admin instructions. Take 1 capsule in the morning and 2 capsules in the evening.     Past Psychiatric History/Hospitalization(s): Patient has at least one psychiatric admission in 2011 due to overdose on her pain  medication.  At that time she has a marital conflict and having argument with her husband.  She denies any history of mania psychosis hallucination.  In the past she has given Zoloft and Remeron however she do not remember the details.  She remembered taking Lexapro and Abilify but it was stopped because lack of response.   Anxiety: Yes Bipolar Disorder: No Depression: Yes Mania: No Psychosis: No Schizophrenia: No Personality Disorder: No Hospitalization for psychiatric illness: Yes History of Electroconvulsive Shock Therapy: No Prior Suicide Attempts: Yes  Physical Exam: Constitutional:  BP 104/70  Pulse 84  General Appearance: alert, oriented, no acute distress and well nourished  Recent Results (from the past 2160 hour(s))  PROTIME-INR ([facility])     Status: None   Collection Time    07/11/13  1:46 PM      Result Value Ref Range   Protime Sent out for confirmation  10.6 - 13.4 Seconds   INR Sent out for confirmation  2.0 - 3.5   Comment: INR is useful only to assess adequacy of anticoagulation with coumadin when comparing results from different labs. It should not be used to estimate bleeding risk or presence/abscense of coagulopathy in patients not on coumadin. Expected INR ranges for      nontherapeutic patients is 0.88 - 1.12.   Lovenox No    CBC WITH DIFFERENTIAL ([facility])     Status: Abnormal   Collection Time    07/11/13  1:46 PM      Result Value Ref Range   WBC 3.5 (*) 3.9 - 10.0 10e3/uL   RBC 4.15  3.70 - 5.32 10e6/uL   HGB 12.2  11.6 - 15.9 g/dL   HCT 38.1  34.8 - 46.6 %   MCV 92  81 - 101 fL   MCH 29.4  26.0 - 34.0 pg   MCHC 32.0  32.0 - 36.0 g/dL   RDW 14.0  11.1 - 15.7 %   Platelets 185  145 - 400 10e3/uL   NEUT# 1.5  1.5 - 6.5 10e3/uL   LYMPH# 1.6  0.9 - 3.3 10e3/uL   MONO# 0.3  0.1 - 0.9 10e3/uL   Eosinophils Absolute 0.0  0.0 - 0.5 10e3/uL   BASO# 0.0  0.0 - 0.2 10e3/uL   NEUT% 42.5  39.6 - 80.0 %   LYMPH% 46.2  14.0 - 48.0 %   MONO%  9.8  0.0 - 13.0 %   EOS% 1.2  0.0 - 7.0 %   BASO% 0.3  0.0 - 2.0 %  D-DIMER, QUANTITATIVE     Status: None   Collection Time    07/11/13  1:46 PM      Result Value Ref Range   D-Dimer, Quant 0.26  0.00 - 0.48 ug/mL-FEU   Comment: At the inhouse established cutoff value of 0.48 ug/mL FEU, thismethology has been documented in the literature to have a sensitivityand negative predictive value of at least 98-99%.  The test resultshould be correlated with an assessment of the clinical      probability ofDVT/VTE.  COMPREHENSIVE METABOLIC PANEL ([facility] ONLY)     Status: Abnormal   Collection Time    07/11/13  1:46 PM      Result Value Ref Range   Sodium 138  128 - 145 mEq/L   Potassium 3.6  3.3 - 4.7 mEq/L   Chloride 97 (*) 98 - 108 mEq/L   CO2 31  18 - 33 mEq/L   Glucose, Bld 126 (*) 73 - 118 mg/dL   BUN, Bld 16  7 - 22 mg/dL   Creat 1.0  0.6 - 1.2 mg/dl   Total Bilirubin 0.40  0.20 - 1.60 mg/dl   Alkaline Phosphatase 102 (*) 26 - 84 U/L   AST 26  11 - 38 U/L   ALT(SGPT) 27  10 - 47 U/L   Total Protein 7.6  6.4 - 8.1 g/dL   Albumin 3.4  3.3 - 5.5 g/dL   Calcium 10.0  8.0 - 10.3 mg/dL  PROTHROMBIN TIME     Status: Abnormal   Collection Time    07/11/13  2:11 PM      Result Value Ref Range   Prothrombin Time 22.4 (*) 11.6 - 15.2 seconds   INR 2.04 (*) <1.50   Comment: The INR is of principal utility in following patients on stable dosesof oral anticoagulants.  The therapeutic range is generally 2.0 to3.0, but may be 3.0 to 4.0 in patients with mechanical cardiac valves,recurrent embolisms and antiphospholipid      antibodies (including lupusinhibitors).    Musculoskeletal: Strength & Muscle Tone: decreased Gait & Station: Patient is wheelchair-bound.  She has the right below-knee amputation. Patient leans: See above  Psychiatric: Speech (describe rate, volume, coherence, spontaneity, and abnormalities if any): Soft with decreased volume and tone.    Thought Process  (describe rate, content, abstract reasoning, and computation): Circumstantial.  Associations: Intact, her fund of knowledge is average  Thoughts: rummination.   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  I will continue Prozac 20 mg in the morning and 40 mg at bedtime.  She is taking Lamictal , vimpat and Keppra.  Encouraged to see a therapist for coping and social skills.  I will schedule appointment with [name] in this office for counseling. Recommend to call us back if she has any question or any concern.  Followup in 3 months.   Carolena Fairbank T., MD 09/03/2013

## 2013-09-07 ENCOUNTER — Ambulatory Visit (HOSPITAL_COMMUNITY): Payer: Self-pay | Admitting: Psychology

## 2013-09-10 ENCOUNTER — Ambulatory Visit (HOSPITAL_BASED_OUTPATIENT_CLINIC_OR_DEPARTMENT_OTHER): Payer: Medicare Other

## 2013-09-10 ENCOUNTER — Ambulatory Visit: Payer: Self-pay | Admitting: Family

## 2013-09-10 ENCOUNTER — Other Ambulatory Visit: Payer: Self-pay | Admitting: Lab

## 2013-09-10 ENCOUNTER — Telehealth: Payer: Self-pay | Admitting: Hematology & Oncology

## 2013-09-10 NOTE — Telephone Encounter (Signed)
Mother moved 7-20 to 7-29 due to car trouble.

## 2013-09-11 ENCOUNTER — Ambulatory Visit (INDEPENDENT_AMBULATORY_CARE_PROVIDER_SITE_OTHER): Payer: Medicare Other | Admitting: Psychology

## 2013-09-11 DIAGNOSIS — F32A Depression, unspecified: Secondary | ICD-10-CM

## 2013-09-11 DIAGNOSIS — F329 Major depressive disorder, single episode, unspecified: Secondary | ICD-10-CM

## 2013-09-11 DIAGNOSIS — F3289 Other specified depressive episodes: Secondary | ICD-10-CM

## 2013-09-11 NOTE — Progress Notes (Signed)
   THERAPIST PROGRESS NOTE  Session Time: 1.38pm-2.30pm  Participation Level: Active  Behavioral Response: Well GroomedAlertAFFECT WNL  Type of Therapy: Individual Therapy  Treatment Goals addressed: Diagnosis: Depression and goal 1.  Interventions: CBT and Supportive  Summary: Cassandra Andrade is a 48 y.o. female who presents with affect that congruent w/ mood.  Pt reports that she has completed mediation w/ her husband for divorce about 1.5 weeks ago.  Pt expressed feelings of more closure and that process went well but also feeling some guilt of her actions causing divorce.  Pt was able to identify that no longer mad w/ self about and no longer feeling pain about.  Pt discussed seeing her children recently when they visited for the weekend and expressing how proud she is of them.  Pt minimizes her role their development.  Pt does identify how they bring her much joy and discusses her 1st and process of adoption and loving him from day one as if her own.  Pt is proud to say that she has started physical therapy again and gaining more confidence with the exercises she is doing.   Suicidal/Homicidal: Nowithout intent/plan  Therapist Response: Assessed pt current functioning per pt report.  Processed w/pt feelings re: divorce process.  Assisted pt in recognizing both have role in success of marriage and reframing negative self talk.  Reflected to pt joy that she has when talking about her children.  Discussed progress made w/ physical therapy towards her independence.    Plan: Return again in 2 weeks.  Diagnosis: Axis I: Depressive Disorder NOS    Axis II: No diagnosis    YATES,LEANNE, LPC 09/11/2013

## 2013-09-19 ENCOUNTER — Other Ambulatory Visit: Payer: Self-pay | Admitting: Lab

## 2013-09-19 ENCOUNTER — Ambulatory Visit: Payer: Self-pay | Admitting: Family

## 2013-09-19 ENCOUNTER — Ambulatory Visit (HOSPITAL_BASED_OUTPATIENT_CLINIC_OR_DEPARTMENT_OTHER)
Admission: RE | Admit: 2013-09-19 | Discharge: 2013-09-19 | Disposition: A | Discharge status: Home / Self Care | Payer: Medicare Other | Source: Ambulatory Visit | Attending: Hematology & Oncology | Admitting: Hematology & Oncology

## 2013-09-19 ENCOUNTER — Other Ambulatory Visit (HOSPITAL_BASED_OUTPATIENT_CLINIC_OR_DEPARTMENT_OTHER): Payer: Medicare Other | Admitting: Lab

## 2013-09-19 ENCOUNTER — Telehealth: Payer: Self-pay | Admitting: Hematology & Oncology

## 2013-09-19 ENCOUNTER — Ambulatory Visit (HOSPITAL_BASED_OUTPATIENT_CLINIC_OR_DEPARTMENT_OTHER): Payer: Medicare Other | Admitting: Family

## 2013-09-19 VITALS — BP 112/66 | HR 79 | Temp 98.0°F | Resp 16 | Wt 186.0 lb

## 2013-09-19 DIAGNOSIS — R894 Abnormal immunological findings in specimens from other organs, systems and tissues: Secondary | ICD-10-CM | POA: Diagnosis not present

## 2013-09-19 DIAGNOSIS — I82409 Acute embolism and thrombosis of unspecified deep veins of unspecified lower extremity: Secondary | ICD-10-CM | POA: Diagnosis present

## 2013-09-19 DIAGNOSIS — S88119A Complete traumatic amputation at level between knee and ankle, unspecified lower leg, initial encounter: Secondary | ICD-10-CM

## 2013-09-19 DIAGNOSIS — Z89511 Acquired absence of right leg below knee: Secondary | ICD-10-CM

## 2013-09-19 DIAGNOSIS — I82403 Acute embolism and thrombosis of unspecified deep veins of lower extremity, bilateral: Secondary | ICD-10-CM

## 2013-09-19 LAB — CBC WITH DIFFERENTIAL (CANCER CENTER ONLY)
BASO#: 0 10*3/uL (ref 0.0–0.2)
BASO%: 0.3 % (ref 0.0–2.0)
EOS ABS: 0.1 10*3/uL (ref 0.0–0.5)
EOS%: 2 % (ref 0.0–7.0)
HCT: 37.7 % (ref 34.8–46.6)
HGB: 12.1 g/dL (ref 11.6–15.9)
LYMPH#: 1.3 10*3/uL (ref 0.9–3.3)
LYMPH%: 37.8 % (ref 14.0–48.0)
MCH: 29.8 pg (ref 26.0–34.0)
MCHC: 32.1 g/dL (ref 32.0–36.0)
MCV: 93 fL (ref 81–101)
MONO#: 0.3 10*3/uL (ref 0.1–0.9)
MONO%: 9 % (ref 0.0–13.0)
NEUT#: 1.8 10*3/uL (ref 1.5–6.5)
NEUT%: 50.9 % (ref 39.6–80.0)
PLATELETS: 170 10*3/uL (ref 145–400)
RBC: 4.06 10*6/uL (ref 3.70–5.32)
RDW: 13.3 % (ref 11.1–15.7)
WBC: 3.4 10*3/uL — AB (ref 3.9–10.0)

## 2013-09-19 NOTE — Progress Notes (Signed)
[place]  Telephone:([phone] Fax:(336) [phone]  ID: Cassandra Andrade OB: 05/05/1965 MR#: [id number] NID#:[id number] Patient Care Team: Fran Lowes, MD as PCP - General Kathlee Nations, MD as Consulting Physician (Psychiatry)  DIAGNOSIS: 1. Bilateral lower extremity deep venous thrombosis.  2. Right below-knee amputation for osteogenic sarcoma.  3. Multiple miscarriages.  4. Positive lupus anticoagulant.  INTERVAL HISTORY: Cassandra Andrade is back today with her mom and sister for a follow-up. She states that her seizure are getting better. She is being seen at [facility] for this. Because of this, we do not have her on one of the new oral anticoagulants. She's having her Coumadin checked weekly. Her appetite is good. She is feeling tired today. She had a doppler study done on her left leg that showed no significant changes in her chronic thrombus of the femoral vein. She denies fever, chills, n/v, cough, rash, headache, dizziness, SOB, chest pain, palpitations, abdominal pain, constipation, diarrhea, blood in urine or stool. She states that she has some swelling in her left leg but no tenderness. She denies swelling, tenderness, numbness or tingling in her extremities. She also is concerned about transportation here. It is hard to get a ride here and she was asking about being seen at [facility] since they live close to there. I told her this would not be a problem. She would also like a higher toilet seat cover so she can get up easier and a compression sock for her left leg.  She's gaining some weight. She does have some pain in the right leg where she had the". I gave them a prescription for Ultram. This is helping her pain.  There's been no cough. She's had no fever.  CURRENT TREATMENT: Coumadin to maintain INR between 2-3  REVIEW OF SYSTEMS: All other 10 point review of systems is negative.    PAST MEDICAL HISTORY: Past Medical History  Diagnosis Date  .  Anxiety   . Depression   . Seizures   . CVA (cerebrovascular accident due to intracerebral hemorrhage) 2004  . Arthritis   . MRSA (methicillin resistant Staphylococcus aureus)   . S/P BKA (below knee amputation) unilateral     Right   . Thyroid disease   . Cancer     Osteosarcoma, right leg  . 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 DVT (deep venous thrombosis)     Left leg   PAST SURGICAL HISTORY: Past Surgical History  Procedure Laterality Date  . Fracture surgery      Rt. femur, folllowed by MRSA  . Brain surgery  2004    cerebral aneurysm  . Total knee arthroplasty Right   . Above knee leg amputation Right     Osteosarcoma   FAMILY HISTORY Family History  Problem Relation Age of Onset  . Anxiety disorder Mother   . Diabetes Father    GYNECOLOGIC HISTORY:  No LMP recorded. Patient has had an ablation.   SOCIAL HISTORY:  History   Social History  . Marital Status: Legally Separated    Spouse Name: N/A    Number of Children: 3  . Years of Education: GED   Occupational History  . Not on file.   Social History Main Topics  . Smoking status: Never Smoker   . Smokeless tobacco: Never Used     Comment: never used tobacco  . Alcohol Use: No  . Drug Use: No  . Sexual Activity: Not Currently   Other Topics  Concern  . Not on file   Social History Narrative   Patient lives at home with mom.    Patient has GED.    Patient has 3 children.    Patient is married but in the process of divorced.          ADVANCED DIRECTIVES: <no information>  HEALTH MAINTENANCE: History  Substance Use Topics  . Smoking status: Never Smoker   . Smokeless tobacco: Never Used     Comment: never used tobacco  . Alcohol Use: No   Colonoscopy: PAP: Bone density: Lipid panel:  Allergies  Allergen Reactions  . Vicodin [Hydrocodone-Acetaminophen] Nausea And Vomiting  . Morphine Rash  . Morphine And Related Rash    Allergic reaction only in iv form     Current Outpatient Prescriptions  Medication Sig Dispense Refill  . Ascorbic Acid (VITAMIN C) 1000 MG tablet Take 1,000 mg by mouth daily.       . Calcium Carbonate-Vitamin D 600-400 MG-UNIT per tablet Take 1 tablet by mouth daily.       . ferrous sulfate 325 (65 FE) MG tablet Take 325 mg by mouth daily with breakfast.      . FLUoxetine (PROZAC) 20 MG capsule Take 1 capsule (20 mg total) by mouth See admin instructions. Take 1 capsule in the morning and 2 capsules in the evening.  90 capsule  2  . lacosamide (VIMPAT) 200 MG TABS tablet Take 1 tablet (200 mg total) by mouth 2 (two) times daily.  60 tablet  5  . lamoTRIgine (LAMICTAL) 200 MG tablet Take 2 tablets (400 mg total) by mouth 2 (two) times daily.  120 tablet  6  . levETIRAcetam (KEPPRA) 750 MG tablet Take 2 tablets (1,500 mg total) by mouth 2 (two) times daily.  120 tablet  6  . levothyroxine (SYNTHROID, LEVOTHROID) 50 MCG tablet Take 50 mcg by mouth daily before breakfast.      . ondansetron (ZOFRAN-ODT) 4 MG disintegrating tablet Take 1 tablet (4 mg total) by mouth once.  20 tablet  0  . oxyCODONE-acetaminophen (PERCOCET/ROXICET) 5-325 MG per tablet Take 1 tablet by mouth every 6 (six) hours as needed for severe pain.  12 tablet  0  . promethazine (PHENERGAN) 25 MG tablet Take 25 mg by mouth every 6 (six) hours as needed. nausea      . traMADol (ULTRAM) 50 MG tablet Take 1 tablet (50 mg total) by mouth every 6 (six) hours as needed.  60 tablet  3  . warfarin (COUMADIN) 5 MG tablet Take 5 mg by mouth 2 (two) times daily.       No current facility-administered medications for this visit.   OBJECTIVE: Filed Vitals:   09/19/13 1300  BP: 112/66  Pulse: 79  Temp: 98 F (36.7 C)  Resp: 16   Body mass index is 29.12 kg/(m^2). ECOG FS:0 - Asymptomatic Ocular: Sclerae unicteric, pupils equal, round and reactive to light Ear-nose-throat: Oropharynx clear, dentition fair Lymphatic: No cervical or supraclavicular adenopathy Lungs no  rales or rhonchi, good excursion bilaterally Heart regular rate and rhythm, no murmur appreciated Abd soft, nontender, positive bowel sounds MSK no focal spinal tenderness, no joint edema Neuro: non-focal, well-oriented, appropriate affect Breasts: Deferred  LAB RESULTS:  No results found for this basename: SPEP, UPEP,  kappa and lambda light chains   Lab Results  Component Value Date   WBC 3.4* 09/19/2013   NEUTROABS 1.8 09/19/2013   HGB 12.1 09/19/2013   HCT 37.7 09/19/2013  MCV 93 09/19/2013   PLT 170 09/19/2013   No results found for this basename: LABCA2   No components found with this basename: [id number]   No results found for this basename: INR,  in the last 168 hours  STUDIES: US Venous Img Lower Bilateral  09/19/2013   CLINICAL DATA:  Chronic bilateral DVT  EXAM: BILATERAL LOWER EXTREMITY VENOUS DOPPLER ULTRASOUND  TECHNIQUE: Gray-scale sonography with graded compression, as well as color Doppler and duplex ultrasound were performed to evaluate the lower extremity deep venous systems from the level of the common femoral vein and including the common femoral, femoral, profunda femoral, popliteal and calf veins including the posterior tibial, peroneal and gastrocnemius veins when visible. The superficial great saphenous vein was also interrogated. Spectral Doppler was utilized to evaluate flow at rest and with distal augmentation maneuvers in the common femoral, femoral and popliteal veins.  COMPARISON:  None.  FINDINGS: Deep veins: Bilateral lower extremity deep veins demonstrate similar recanalized thrombus with mural thrombus seen throughout both common femoral veins and the femoral veins of the thigh. No significant popliteal or tibial thrombus is identified on the left. The patient is status post amputation of the right lower leg.  Compressibility of deep veins: Partially compressible.  Duplex waveform respiratory phasicity:  Diminished but Normal.  Augmentation not performed.   Venous reflux: None visualized.  Other findings: No evidence of superficial thrombophlebitis or abnormal fluid collection.  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 No significant interval change.   Electronically Signed   By: Daryll Brod M.D.   On: 09/19/2013 12:26    ASSESSMENT/PLAN: Cassandra Andrade is a 43 her old [place] American female with a lupus anticoagulant. 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 She will more than likely need to be on long-term anticoagulation. She is at significant risk for thrombotic episodes if we take her off anticoagulation. She will continue on her Coumadin and be followed by the clinic. I sent prescriptions to [facility] for a compression sock and higher toilet seat. We will set her up for labs and a follow-up in 3 months at [facility].  All questions were answered and she is in agreement with the plan. She knows to call here with any questions or concerns and to go to the ED in the event of an emergency. We can certainly see her sooner if need be.   Eliezer Bottom, NP 09/19/2013 2:38 PM

## 2013-09-19 NOTE — Telephone Encounter (Signed)
Pt wants to switch to [facility] I sent e-mail to [facility]

## 2013-09-21 ENCOUNTER — Ambulatory Visit (HOSPITAL_COMMUNITY): Payer: Self-pay | Admitting: Psychology

## 2013-09-21 LAB — LUPUS ANTICOAGULANT PANEL
DRVVT 1 1 MIX: 37 s (ref <42.9)
DRVVT: 52.7 s — AB (ref <42.9)
LUPUS ANTICOAGULANT: NOT DETECTED
PTT LA: 61.4 s — AB (ref 28.0–43.0)
PTTLA 4:1 Mix: 48.2 secs — ABNORMAL HIGH (ref 28.0–43.0)
PTTLA CONFIRMATION: 6.2 s (ref <8.0)

## 2013-09-21 LAB — D-DIMER, QUANTITATIVE (NOT AT ARMC): D DIMER QUANT: 0.35 ug{FEU}/mL (ref 0.00–0.48)

## 2013-10-05 ENCOUNTER — Ambulatory Visit (INDEPENDENT_AMBULATORY_CARE_PROVIDER_SITE_OTHER): Payer: Medicare Other | Admitting: Psychology

## 2013-10-05 DIAGNOSIS — F329 Major depressive disorder, single episode, unspecified: Secondary | ICD-10-CM

## 2013-10-05 DIAGNOSIS — F3289 Other specified depressive episodes: Secondary | ICD-10-CM

## 2013-10-05 DIAGNOSIS — F32A Depression, unspecified: Secondary | ICD-10-CM

## 2013-10-05 NOTE — Progress Notes (Signed)
   THERAPIST PROGRESS NOTE  Session Time: 1:40pm-2.20pm  Participation Level: Active  Behavioral Response: Well GroomedAlertAFFECT WNL  Type of Therapy: Individual Therapy  Treatment Goals addressed: Diagnosis: Depressive D/O NOS and goal 1.  Interventions: CBT  Summary: Maridee Slape is a 48 y.o. female who presents with affect that is bright.  Pt reports that she has been feeling good about connecting again w/ physical therapy and this is giving her confidence.  Pt reported that she needs to f/u w/ her doctor to receive order for new prosthetic fit.  Pt reported she is aware that her missing teeth also effect how she feels about self and depression.  She reports that mom is following up are scheduling both of these.  Pt also discussed that she receives a lot of confidence from raising her children and seeing them grow.  Pt discussed how she is connected w/ this today.    Suicidal/Homicidal: Nowithout intent/plan  Therapist Response: ASsessed pt current functioning per pt report.  Processed w/ pt her mood and improvement and contributing factors.  Assisted pt in connecting to areas in her life that give her confidence and feeling strength.    Plan: Return again in 2-3 weeks.  Diagnosis: Axis I: Depressive Disorder NOS    Axis II: No diagnosis    Bernedette Auston, LPC 10/05/2013

## 2013-10-19 ENCOUNTER — Ambulatory Visit (HOSPITAL_COMMUNITY): Payer: Self-pay | Admitting: Psychology

## 2013-11-02 ENCOUNTER — Ambulatory Visit (HOSPITAL_COMMUNITY): Payer: Self-pay | Admitting: Psychology

## 2013-11-02 ENCOUNTER — Telehealth (HOSPITAL_COMMUNITY): Payer: Self-pay | Admitting: Psychology

## 2013-11-02 NOTE — Telephone Encounter (Signed)
Pt missed appointment.  Called and informed of missed appointment. Mother answered and informed that they both fell asleep, but she did call to front desk, informed them and has rescheduled.

## 2013-11-06 ENCOUNTER — Ambulatory Visit (INDEPENDENT_AMBULATORY_CARE_PROVIDER_SITE_OTHER): Payer: Medicare Other | Admitting: Psychology

## 2013-11-06 DIAGNOSIS — F329 Major depressive disorder, single episode, unspecified: Secondary | ICD-10-CM

## 2013-11-06 DIAGNOSIS — F3289 Other specified depressive episodes: Secondary | ICD-10-CM

## 2013-11-06 DIAGNOSIS — F32A Depression, unspecified: Secondary | ICD-10-CM

## 2013-11-06 NOTE — Progress Notes (Signed)
   THERAPIST PROGRESS NOTE  Session Time: 3.35pm-4.12pm  Participation Level: Active  Behavioral Response: Casual and Well GroomedAlertaffect bright  Type of Therapy: Individual Therapy  Treatment Goals addressed: Diagnosis: Depressive D/O NOS and goal 1.  Interventions: Strength-based and Supportive  Summary: Cassandra Andrade is a 48 y.o. female who presents with report that she has been feeling good, happy and having a good day.  Pt reported that her physical therapy is going well- they are challenging her and she is responding and motivated.  Pt reports she completes her exercises on days not in therapy.  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  Pt expresses want to make a difference and not give up despite her barriers.    Suicidal/Homicidal: Nowithout intent/plan  Therapist Response: Assessed pt current functioning per her report.  Processed w/pt her mood and reflected progress and improvement.  Encouraged pt to look at volunteering when able as way of becoming involved and potential resources for her as she works towards her long term goals- vocational rehabilitation.   Plan: Return again in 3 weeks.  Diagnosis: Axis I: Depressive Disorder NOS    Axis II: No diagnosis    YATES,LEANNE, LPC 11/06/2013

## 2013-11-07 DIAGNOSIS — G8929 Other chronic pain: Secondary | ICD-10-CM | POA: Insufficient documentation

## 2013-11-07 DIAGNOSIS — M549 Dorsalgia, unspecified: Secondary | ICD-10-CM

## 2013-11-21 ENCOUNTER — Telehealth: Payer: Self-pay | Admitting: Hematology

## 2013-11-21 NOTE — Telephone Encounter (Signed)
C/D 11/21/13 for appt. 12/20/13

## 2013-11-21 NOTE — Telephone Encounter (Signed)
S/W PATIENT MOTHER JANICE AND GAVE NP APPT FOR 10/29 @ 10:30 W/SEHBAI. PATIENT TRANSFER FROM DR. ENNEVER OFFICE DUE TO TRANSPORTATION ISSUES.

## 2013-11-23 ENCOUNTER — Ambulatory Visit (HOSPITAL_COMMUNITY): Payer: Self-pay | Admitting: Psychology

## 2013-12-03 ENCOUNTER — Encounter (HOSPITAL_COMMUNITY): Payer: Self-pay | Admitting: Psychiatry

## 2013-12-03 ENCOUNTER — Ambulatory Visit (INDEPENDENT_AMBULATORY_CARE_PROVIDER_SITE_OTHER): Payer: Medicare Other | Admitting: Psychiatry

## 2013-12-03 VITALS — BP 114/80 | HR 92

## 2013-12-03 DIAGNOSIS — F339 Major depressive disorder, recurrent, unspecified: Secondary | ICD-10-CM

## 2013-12-03 DIAGNOSIS — F321 Major depressive disorder, single episode, moderate: Secondary | ICD-10-CM

## 2013-12-03 MED ORDER — FLUOXETINE HCL 20 MG PO CAPS: 20.0000 mg | ORAL_CAPSULE | ORAL | Status: DC

## 2013-12-03 NOTE — Progress Notes (Signed)
[place] Progress Note  Cassandra Andrade [id number] 48 y.o.  12/03/2013 10:36 AM  Chief Complaint:  Medication management and followup.      History of Present Illness: Cassandra Andrade came for her followup appointment with her mother. She is compliant with her psychotropic medication.  She is seeing a therapist for coping and social skills.  She also very happy with her new health aide.  She is taking Prozac and denies any side effects.  Her mother endorsed lately she is less irritable and angry.  Her sleep is good.  She recently has procedure to him blonde device to prevent seizures.  However it has not been started yet.  Mother is very happy because she has less episodes of seizures.  The patient denies any crying spells.  She denies any physical hopelessness or worthlessness.  Patient does not drink or use any illegal substances.  Her appetite is okay.  Her vitals are stable.  She lives with her mother who is very supportive.  Suicidal Ideation: Yes Plan Formed: No Patient has means to carry out plan: No  Homicidal Ideation: No Plan Formed: No Patient has means to carry out plan: No  Review of Systems  Musculoskeletal: Positive for back pain.  Neurological: Positive for focal weakness and seizures.  Psychiatric/Behavioral: Negative for hallucinations and substance abuse.    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  She has history of MRSA resulted right knee amputation.  She has hypothyroidism, chronic neuropathy pain , headache and seizure disorder.  She see Dr. Jannifer Andrade for seizures.  Her primary  care is Cassandra Andrade at [place].    Outpatient Encounter Prescriptions as of 12/03/2013  Medication Sig  . Ascorbic Acid (VITAMIN C) 1000 MG tablet Take 1,000 mg by mouth daily.   . Calcium Carbonate-Vitamin D 600-400 MG-UNIT per tablet Take 1 tablet by mouth daily.   . ferrous sulfate 325 (65 FE) MG tablet Take 325 mg by mouth daily with breakfast.  . FLUoxetine (PROZAC) 20 MG capsule Take 1 capsule (20 mg total) by mouth See admin instructions. Take 1 capsule in the morning and 2 capsules in the evening.  . lacosamide (VIMPAT) 200 MG TABS tablet Take 1 tablet (200 mg total) by mouth 2 (two) times daily.  [name] lamoTRIgine (LAMICTAL) 200 MG tablet Take 2 tablets (400 mg total) by mouth 2 (two) times daily.  [name] levETIRAcetam (KEPPRA) 750 MG tablet Take 2 tablets (1,500 mg total) by mouth 2 (two) times daily.  [name] levothyroxine (SYNTHROID, LEVOTHROID) 50 MCG tablet Take 50 mcg by mouth daily before breakfast.  . ondansetron (ZOFRAN-ODT) 4 MG disintegrating tablet Take 1 tablet (4 mg total) by mouth once.  [name] oxyCODONE-acetaminophen (PERCOCET/ROXICET) 5-325 MG per tablet Take 1 tablet by mouth every 6 (six) hours as needed for severe pain.  . promethazine (PHENERGAN) 25 MG tablet Take 25 mg by mouth every 6 (six) hours as needed. nausea  . traMADol (ULTRAM) 50 MG tablet Take 1 tablet (50 mg total) by mouth every 6 (six) hours as needed.  . warfarin (COUMADIN) 5 MG tablet Take 5 mg by mouth 2 (two) times daily.  . [DISCONTINUED] FLUoxetine (PROZAC) 20 MG capsule  Take 1 capsule (20 mg total) by mouth See admin instructions. Take 1 capsule in the morning and 2 capsules in the evening.   Past Psychiatric History/Hospitalization(s): Patient has at least one psychiatric admission in 2011 due to overdose on her pain medication.  At that time she has a marital conflict and having argument with her husband.  She denies any history of mania psychosis hallucination.  In the past she has  given Zoloft and Remeron however she do not remember the details.  She remembered taking Lexapro and Abilify but it was stopped because lack of response.   Anxiety: Yes Bipolar Disorder: No Depression: Yes Mania: No Psychosis: No Schizophrenia: No Personality Disorder: No Hospitalization for psychiatric illness: Yes History of Electroconvulsive Shock Therapy: No Prior Suicide Attempts: Yes  Physical Exam: Constitutional:  BP 114/80  Pulse 92  General Appearance: alert, oriented, no acute distress and well nourished  Recent Results (from the past 2160 hour(s))  CBC WITH DIFFERENTIAL ([facility])     Status: Abnormal   Collection Time    09/19/13  1:11 PM      Result Value Ref Range   WBC 3.4 (*) 3.9 - 10.0 10e3/uL   RBC 4.06  3.70 - 5.32 10e6/uL   HGB 12.1  11.6 - 15.9 g/dL   HCT 37.7  34.8 - 46.6 %   MCV 93  81 - 101 fL   MCH 29.8  26.0 - 34.0 pg   MCHC 32.1  32.0 - 36.0 g/dL   RDW 13.3  11.1 - 15.7 %   Platelets 170  145 - 400 10e3/uL   NEUT# 1.8  1.5 - 6.5 10e3/uL   LYMPH# 1.3  0.9 - 3.3 10e3/uL   MONO# 0.3  0.1 - 0.9 10e3/uL   Eosinophils Absolute 0.1  0.0 - 0.5 10e3/uL   BASO# 0.0  0.0 - 0.2 10e3/uL   NEUT% 50.9  39.6 - 80.0 %   LYMPH% 37.8  14.0 - 48.0 %   MONO% 9.0  0.0 - 13.0 %   EOS% 2.0  0.0 - 7.0 %   BASO% 0.3  0.0 - 2.0 %  D-DIMER, QUANTITATIVE     Status: None   Collection Time    09/19/13  1:11 PM      Result Value Ref Range   D-Dimer, Quant 0.35  0.00 - 0.48 ug/mL-FEU   Comment: At the inhouse established cutoff value of 0.48 ug/mL FEU, thismethology has been documented in the literature to have a sensitivityand negative predictive value of at least 98-99%.  The test resultshould be correlated with an assessment of the clinical      probability ofDVT/VTE.  LUPUS ANTICOAGULANT PANEL     Status: Abnormal   Collection Time    09/19/13  1:11 PM      Result Value Ref Range   PTT Lupus Anticoagulant 61.4 (*) 28.0 - 43.0 secs   PTTLA 4:1 Mix 48.2 (*)  28.0 - 43.0 secs   PTTLA Confirmation 6.2  <8.0 secs   DRVVT 52.7 (*) <42.9 secs   DRVVT 1:1 Mix 37.0  <42.9 secs   Drvvt confirmation NOT APPL  <1.15 Ratio   Lupus Anticoagulant NOT DETECTED  NOT DETECTED    Musculoskeletal: Strength & Muscle Tone: decreased Gait & Station: Patient is wheelchair-bound.  She has the right below-knee amputation. Patient leans: See above  Psychiatric: Speech (describe rate, volume, coherence, spontaneity, and abnormalities if any): Soft with decreased volume and tone.    Thought Process (  describe rate, content, abstract reasoning, and computation): Circumstantial.  Associations: Intact, no delusions or any paranoia.  Her fund of knowledge is average.    Thoughts: normal and No active or passive suicidal thoughts or homicidal thoughts.  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  I reviewed blood work which is normal.  I will continue Prozac 20 mg in the morning and 40 mg at bedtime.  She is taking Lamictal , vimpat and Keppra from her neurologist. Encouraged to see a therapist for coping and social skills. Recommended to call us back if she has any question or any concern.  Time spent 25 minutes.  More than 50% of the time spent in psychoeducation, counseling and coordination of care.  Discuss safety plan that anytime having active suicidal thoughts or homicidal thoughts then patient need to  call 911 or go to the local emergency room.  I will see her again in 3 months  Marquavis Hannen T., MD 12/03/2013

## 2013-12-14 ENCOUNTER — Ambulatory Visit (INDEPENDENT_AMBULATORY_CARE_PROVIDER_SITE_OTHER): Payer: Medicare Other | Admitting: Psychology

## 2013-12-14 DIAGNOSIS — F32A Depression, unspecified: Secondary | ICD-10-CM

## 2013-12-14 DIAGNOSIS — F329 Major depressive disorder, single episode, unspecified: Secondary | ICD-10-CM

## 2013-12-14 NOTE — Progress Notes (Signed)
   THERAPIST PROGRESS NOTE  Session Time: 3.34pm-4.16pm  Participation Level: Active  Behavioral Response: Well GroomedAlertAFFECT WNL  Type of Therapy: Individual Therapy  Treatment Goals addressed: Diagnosis: Depressive D/O and goal 1.  Interventions: CBT and Supportive  Summary: Cassandra Andrade is a 48 y.o. female who presents with generally full and bright affect.  Pt reported that she is doing well w/ her physcial therapy and care giver that assist her.  Pt reports that doctor has ordered a new prosthetic and will be fitted soon.  Pt reported that her mood has been good and she is focusing on being mindful and present when mood is good and not worry how long it will last as this has negative impact on mood.  Pt also awareness of need to focus on not blaming self for past choices. Pt discussed upcoming holidays and hoping to see her sons and exinlaws.    Suicidal/Homicidal: Nowithout intent/plan  Therapist Response: Assessed pt current functioning per pt report.  Processed w/pt her mood and continued improvements and how important to be present w/ mood and not focus on negative.  Discussed w/ pt also forgiving self and giving self compassion and how to focus on this w/ self talk.   Plan: Return again in 3 weeks.  Diagnosis: Axis I: Depressive Disorder NOS    Axis II: No diagnosis    YATES,LEANNE, LPC 12/14/2013

## 2013-12-20 ENCOUNTER — Telehealth: Payer: Self-pay | Admitting: Hematology

## 2013-12-20 ENCOUNTER — Ambulatory Visit: Payer: Medicare Other

## 2013-12-20 ENCOUNTER — Encounter: Payer: Self-pay | Admitting: Hematology

## 2013-12-20 ENCOUNTER — Ambulatory Visit (HOSPITAL_BASED_OUTPATIENT_CLINIC_OR_DEPARTMENT_OTHER): Payer: Medicare Other | Admitting: Hematology

## 2013-12-20 ENCOUNTER — Other Ambulatory Visit (HOSPITAL_BASED_OUTPATIENT_CLINIC_OR_DEPARTMENT_OTHER): Payer: Medicare Other

## 2013-12-20 VITALS — BP 106/73 | HR 78 | Temp 98.2°F | Resp 20 | Ht 67.0 in | Wt 185.7 lb

## 2013-12-20 DIAGNOSIS — Z23 Encounter for immunization: Secondary | ICD-10-CM

## 2013-12-20 DIAGNOSIS — D6862 Lupus anticoagulant syndrome: Secondary | ICD-10-CM

## 2013-12-20 DIAGNOSIS — I82412 Acute embolism and thrombosis of left femoral vein: Secondary | ICD-10-CM

## 2013-12-20 DIAGNOSIS — I82401 Acute embolism and thrombosis of unspecified deep veins of right lower extremity: Secondary | ICD-10-CM

## 2013-12-20 DIAGNOSIS — I82403 Acute embolism and thrombosis of unspecified deep veins of lower extremity, bilateral: Secondary | ICD-10-CM

## 2013-12-20 LAB — CBC WITH DIFFERENTIAL/PLATELET
BASO%: 0.3 % (ref 0.0–2.0)
Basophils Absolute: 0 10*3/uL (ref 0.0–0.1)
EOS%: 1.3 % (ref 0.0–7.0)
Eosinophils Absolute: 0.1 10*3/uL (ref 0.0–0.5)
HCT: 40.1 % (ref 34.8–46.6)
HGB: 13 g/dL (ref 11.6–15.9)
LYMPH#: 1.4 10*3/uL (ref 0.9–3.3)
LYMPH%: 38.3 % (ref 14.0–49.7)
MCH: 29.2 pg (ref 25.1–34.0)
MCHC: 32.4 g/dL (ref 31.5–36.0)
MCV: 90.1 fL (ref 79.5–101.0)
MONO#: 0.3 10*3/uL (ref 0.1–0.9)
MONO%: 9.1 % (ref 0.0–14.0)
NEUT#: 1.9 10*3/uL (ref 1.5–6.5)
NEUT%: 51 % (ref 38.4–76.8)
Platelets: 196 10*3/uL (ref 145–400)
RBC: 4.45 10*6/uL (ref 3.70–5.45)
RDW: 13.6 % (ref 11.2–14.5)
WBC: 3.7 10*3/uL — ABNORMAL LOW (ref 3.9–10.3)

## 2013-12-20 MED ORDER — INFLUENZA VAC SPLIT QUAD 0.5 ML IM SUSY
0.5000 mL | PREFILLED_SYRINGE | Freq: Once | INTRAMUSCULAR | Status: AC
Start: 2013-12-20 — End: 2013-12-20
  Administered 2013-12-20: 0.5 mL via INTRAMUSCULAR
  Filled 2013-12-20: qty 0.5

## 2013-12-20 NOTE — Telephone Encounter (Signed)
gave avs & cal for Jan 2016

## 2013-12-20 NOTE — Progress Notes (Signed)
[place]  [phone] Fax:([phone]) [place] VISIT Date of Service: 12/20/2013   ID: Cassandra Andrade OB: 02-27-1965 MR#: [id number] XID#:[id number] Patient Care Team: Fran Lowes, MD as PCP - General Kathlee Nations, MD as Consulting Physician (Psychiatry)  REASON FOR OFFICE VISIT:  1. Bilateral lower extremity deep venous thrombosis.  2. Right below-knee amputation for osteogenic sarcoma.  3. Multiple miscarriages.  4. Positive lupus anticoagulant.  PATIENT IDENTIFICATION  Cassandra Andrade is a pleasant 48 years old African-American female who is coming to established here at [facility]. Previously she was being seen by Dr. Burney Gauze at [facility] location. 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 She had radiation and chemotherapy. Should the surgery with the bone prosthesis. 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 It also causes her some memory problems. She was in coma for a while and she has a tracheostomy done and was placed in a long-term care facility. She has history of multiple miscarriages. She does have 3 children. She also had a right AKA because of the right lower leg infection Iin2013. 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 She was initially evaluated by Dr. Burney Gauze on 12/01/2012. She is wheelchair bound. She also have history of situational depression, iron deficiency anemia and hypothyroidism. 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 He also takes antiseizure therapy is no known family history of thromboembolic problems. She had couple of venous duplex studies done last one was on 09/19/2013 showing similar appearing residual chronic thrombus within the common femoral and femoral veins bilaterally. No significant interval change from 04/04/2013 patient's mother have  a machine at home where they check her INR and the target INR is between 2-3. Her current Coumadin dose 10 mg daily and the results are being called out to physician who regulates the Coumadin. Patient and her mother would like to continue the same arrangement although I offered her that we have a dedicated Coumadin clinic and we can help in Coumadin regulation if they desire. Because of a positive lupus anticoagulant, a prior history of osteosarcoma, generalized deconditioning and wheelchair bound status, and presence of chronic thrombus on vascular imaging, I would agree to continuation of Coumadin long-term as recommended by Dr. Marin Olp.  We did tests a d-dimer and lupus anticoagulant in office today and I am repeating those studies in 3 months. If the studies are negative on both occasions, one can consider stopping Coumadin and switching her to a baby aspirin for thromboprophylaxis.  Cassandra Andrade is back today with her mom for a follow-up. She states that her seizure are getting better. She is being seen at [facility] for this. Because of this, we do not have her on one of the new oral anticoagulants. She's having her Coumadin checked weekly. Her appetite is good. She is feeling tired today. She had a doppler study done on her left leg that showed no significant changes in her chronic thrombus of the femoral vein. She denies fever, chills, n/v, cough, rash, headache, dizziness, SOB, chest pain, palpitations, abdominal pain, constipation, diarrhea, blood in urine or stool. She states that she has some swelling in her left leg but no tenderness. She denies swelling, tenderness, numbness or tingling in her extremities   CURRENT TREATMENT: Coumadin 10  mg  to maintain INR between 2-3  REVIEW OF SYSTEMS: All other 10 point review of systems is negative.    PAST MEDICAL HISTORY: Past Medical History  Diagnosis Date  . Anxiety   . Depression   . Seizures   . CVA (cerebrovascular accident due to  intracerebral hemorrhage) 2004  . Arthritis   . MRSA (methicillin resistant Staphylococcus aureus)   . S/P BKA (below knee amputation) unilateral     Right   . Thyroid disease   . Cancer     Osteosarcoma, right leg  . 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 DVT (deep venous thrombosis)     Left leg   PAST SURGICAL HISTORY: Past Surgical History  Procedure Laterality Date  . Fracture surgery      Rt. femur, folllowed by MRSA  . Brain surgery  2004    cerebral aneurysm  . Total knee arthroplasty Right   . Above knee leg amputation Right     Osteosarcoma   FAMILY HISTORY Family History  Problem Relation Age of Onset  . Anxiety disorder Mother   . Diabetes Father    GYNECOLOGIC HISTORY:  No LMP recorded. Patient has had an ablation.   SOCIAL HISTORY:  History   Social History  . Marital Status: Legally Separated    Spouse Name: N/A    Number of Children: 3  . Years of Education: GED   Occupational History  . Not on file.   Social History Main Topics  . Smoking status: Never Smoker   . Smokeless tobacco: Never Used     Comment: never used tobacco  . Alcohol Use: No  . Drug Use: No  . Sexual Activity: Not Currently   Other Topics Concern  . Not on file   Social History Narrative   Patient lives at home with mom.    Patient has GED.    Patient has 3 children.    Patient is married but in the process of divorced.          ADVANCED DIRECTIVES: <no information>  HEALTH MAINTENANCE: History  Substance Use Topics  . Smoking status: Never Smoker   . Smokeless tobacco: Never Used     Comment: never used tobacco  . Alcohol Use: No   Colonoscopy: PAP: Bone density: Lipid panel:  Allergies  Allergen Reactions  . Vicodin [Hydrocodone-Acetaminophen] Nausea And Vomiting  . Morphine Rash  . Morphine And Related Rash    Allergic reaction only in iv form    Current Outpatient Prescriptions  Medication Sig Dispense Refill  . Ascorbic  Acid (VITAMIN C) 1000 MG tablet Take 1,000 mg by mouth daily.       . Calcium Carbonate-Vitamin D 600-400 MG-UNIT per tablet Take 1 tablet by mouth daily.       [name] DIAZEPAM RE Place 20 mg rectally. Rectally as needed for seizure lasting longer than 2 minutes      . ferrous sulfate 325 (65 FE) MG tablet Take 325 mg by mouth daily with breakfast.      . FLUoxetine (PROZAC) 20 MG capsule Take 1 capsule (20 mg total) by mouth See admin instructions. Take 1 capsule in the morning and 2 capsules in the evening.  90 capsule  2  . lacosamide (VIMPAT) 200 MG TABS tablet Take 1 tablet (200 mg total) by mouth 2 (two) times daily.  60 tablet  5  . lamoTRIgine (LAMICTAL) 200 MG tablet Take 2 tablets (400 mg total) by mouth 2 (  two) times daily.  120 tablet  6  . levETIRAcetam (KEPPRA) 1000 MG tablet Take 2,000 mg by mouth 2 (two) times daily.      [name] levothyroxine (SYNTHROID, LEVOTHROID) 50 MCG tablet Take 50 mcg by mouth daily before breakfast.      . oxyCODONE-acetaminophen (PERCOCET/ROXICET) 5-325 MG per tablet Take 1 tablet by mouth every 6 (six) hours as needed for severe pain.  12 tablet  0  . promethazine (PHENERGAN) 25 MG tablet Take 25 mg by mouth every 6 (six) hours as needed. nausea      . warfarin (COUMADIN) 5 MG tablet Take 5 mg by mouth 2 (two) times daily.       No current facility-administered medications for this visit.   OBJECTIVE: Filed Vitals:   12/20/13 1136  BP: 106/73  Pulse: 78  Temp: 98.2 F (36.8 C)  Resp: 20   Body mass index is 29.08 kg/(m^2). ECOG FS: 0 Ocular: Sclerae unicteric, pupils equal, round and reactive to light Ear-nose-throat: Oropharynx clear, dentition fair Lymphatic: No cervical or supraclavicular adenopathy Lungs no rales or rhonchi, good excursion bilaterally Heart regular rate and rhythm, no murmur appreciated Abd soft, nontender, positive bowel sounds MSK no focal spinal tenderness, no joint edema Neuro: non-focal, well-oriented, appropriate  affect Breasts: Deferred  LAB RESULTS:  No results found for this basename: SPEP,  UPEP,   kappa and lambda light chains   Lab Results  Component Value Date   WBC 3.7* 12/20/2013   NEUTROABS 1.9 12/20/2013   HGB 13.0 12/20/2013   HCT 40.1 12/20/2013   MCV 90.1 12/20/2013   PLT 196 12/20/2013   D-dimer and Lupus Anticoagulant pending  STUDIES: US Venous Img Lower Bilateral  09/19/2013   CLINICAL DATA:  Chronic bilateral DVT  EXAM: BILATERAL LOWER EXTREMITY VENOUS DOPPLER ULTRASOUND  TECHNIQUE: Gray-scale sonography with graded compression, as well as color Doppler and duplex ultrasound were performed to evaluate the lower extremity deep venous systems from the level of the common femoral vein and including the common femoral, femoral, profunda femoral, popliteal and calf veins including the posterior tibial, peroneal and gastrocnemius veins when visible. The superficial great saphenous vein was also interrogated. Spectral Doppler was utilized to evaluate flow at rest and with distal augmentation maneuvers in the common femoral, femoral and popliteal veins.  COMPARISON:  None.  FINDINGS: Deep veins: Bilateral lower extremity deep veins demonstrate similar recanalized thrombus with mural thrombus seen throughout both common femoral veins and the femoral veins of the thigh. No significant popliteal or tibial thrombus is identified on the left. The patient is status post amputation of the right lower leg.  Compressibility of deep veins: Partially compressible.  Duplex waveform respiratory phasicity:  Diminished but Normal.  Augmentation not performed.  Venous reflux: None visualized.  Other findings: No evidence of superficial thrombophlebitis or abnormal fluid collection.  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 No significant interval change.   Electronically Signed   By: Daryll Brod M.D.   On: 09/19/2013 12:26     ASSESSMENT/PLAN:  1. Cassandra Andrade is a 73 her old [place] American female with a lupus anticoagulant. 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 She will more than likely need to be on long-term anticoagulation. She is at significant risk for thrombotic episodes if we take her off anticoagulation.  2. She will continue on her Coumadin and be followed by the clinic in 3 months.  3. If her d-dimer and lupus anticoagulant testing is negative today and in 3 months, we can consider switching her to aspirin for thromboprophylaxis.  4. I explained to her about the antiphospholipid syndrome and the meaning of having a positive lupus anticoagulant. It is a prothrombotic state which increases the risk for venous as well as arterial events physical DVT, MI, TIA, CVA and also causes miscarriages. Lupus anticoagulant can wax and wane and if there are no flareups and activity is negative and if her d-dimer is negative, one can consider switching to aspirin for future needs.  All questions were answered and she is in agreement with the plan. She knows to call here with any questions or concerns and to go to the ED in the event of an emergency. We can certainly see her sooner if need be.   Bernadene Bell, MD Medical Hematologist/Oncologist [place] Pager: [phone] Office No: [phone]

## 2013-12-20 NOTE — Progress Notes (Signed)
Checked in new pt with no financial concerns at this time.  Pt has 3 insurances so financial assistance may not be needed but she has Raquel's card for any questions or concerns.

## 2013-12-21 LAB — LUPUS ANTICOAGULANT PANEL
DRVVT: 42.8 s (ref <42.9)
LUPUS ANTICOAGULANT: NOT DETECTED
PTT Lupus Anticoagulant: 53.5 secs — ABNORMAL HIGH (ref 28.0–43.0)
PTTLA 4:1 Mix: 48 secs — ABNORMAL HIGH (ref 28.0–43.0)
PTTLA Confirmation: 2.5 secs (ref <8.0)

## 2013-12-21 LAB — D-DIMER, QUANTITATIVE: D-Dimer, Quant: 0.29 ug/mL-FEU (ref 0.00–0.48)

## 2013-12-31 DIAGNOSIS — Z9889 Other specified postprocedural states: Secondary | ICD-10-CM | POA: Insufficient documentation

## 2014-01-04 ENCOUNTER — Ambulatory Visit (HOSPITAL_COMMUNITY): Payer: Self-pay | Admitting: Psychology

## 2014-01-04 ENCOUNTER — Ambulatory Visit (INDEPENDENT_AMBULATORY_CARE_PROVIDER_SITE_OTHER): Payer: Medicare Other | Admitting: Psychology

## 2014-01-04 DIAGNOSIS — F32A Depression, unspecified: Secondary | ICD-10-CM

## 2014-01-04 DIAGNOSIS — F329 Major depressive disorder, single episode, unspecified: Secondary | ICD-10-CM
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
## 2014-01-07 ENCOUNTER — Other Ambulatory Visit: Payer: Self-pay | Admitting: Nurse Practitioner

## 2014-01-07 NOTE — Telephone Encounter (Signed)
Rx signed and faxed.

## 2014-01-09 ENCOUNTER — Ambulatory Visit (INDEPENDENT_AMBULATORY_CARE_PROVIDER_SITE_OTHER): Payer: Medicare Other | Admitting: Nurse Practitioner

## 2014-01-09 ENCOUNTER — Encounter: Payer: Self-pay | Admitting: Nurse Practitioner

## 2014-01-09 VITALS — BP 111/73 | HR 81 | Temp 97.9°F | Resp 16 | Ht 67.0 in | Wt 185.6 lb

## 2014-01-09 DIAGNOSIS — G40919 Epilepsy, unspecified, intractable, without status epilepticus: Secondary | ICD-10-CM

## 2014-01-09 DIAGNOSIS — G40909 Epilepsy, unspecified, not intractable, without status epilepticus: Secondary | ICD-10-CM

## 2014-01-09 MED ORDER — LACOSAMIDE 200 MG PO TABS: 200.0000 mg | ORAL_TABLET | Freq: Two times a day (BID) | ORAL | Status: DC

## 2014-01-09 MED ORDER — LEVETIRACETAM 1000 MG PO TABS: 2000.0000 mg | ORAL_TABLET | Freq: Two times a day (BID) | ORAL | Status: DC

## 2014-01-09 MED ORDER — LAMOTRIGINE 200 MG PO TABS: 400.0000 mg | ORAL_TABLET | Freq: Two times a day (BID) | ORAL | Status: DC

## 2014-01-09 NOTE — Progress Notes (Signed)
I have read the note, and I agree with the clinical assessment and plan.  Cassandra Andrade KEITH   

## 2014-01-09 NOTE — Progress Notes (Signed)
[facility]  PATIENT: Cassandra Andrade DOB: Jan 01, 1966   REASON FOR VISIT: follow-up for intractable seizure disorder  HISTORY OF PRESENT ILLNESS:Cassandra Andrade, 48 year old female returns for followup with her mother. She has a history of intractable seizures. The patient has a history of a cerebral aneurysm and a right brain stroke. The patient has been on Lamictal and Vimpat, and Keppra was added. The patient has had video EEG monitoring that has confirmed the presence of subclinical seizures as well as clinical seizures without EEG correlates. It is felt that these events are stereotypical, and likely represent focal seizures without EEG correlates. The patient has been in the hospital on 2 occasions in December, once on December 27, and again on December 31. The patient has been placed on Keppra, currently on 2000 mg twice daily. Her seizure frequency is now weekly or a little less. The patient may have episodes of agitation, and slurred speech. The patient is eating well. 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 The patient returns to this office for an evaluation. Recent labs by Dr. Raj Janus at [facility] reviewed. VNS placed on 12/31/13 at [facility].  The mother and patient claim her mood is better since placement of VNS as well.    REVIEW OF SYSTEMS: Full 14 system review of systems performed and notable only for those listed, all others are neg:  Constitutional: N/A  Cardiovascular: N/A  Ear/Nose/Throat: N/A  Skin: N/A  Eyes: N/A  Respiratory: N/A  Gastroitestinal: N/A  Hematology/Lymphatic: easy bruising Endocrine: N/A Musculoskeletal:N/A  Allergy/Immunology: N/A  Neurological: seizure, memory loss Psychiatric: N/A Sleep : NA   ALLERGIES: Allergies  Allergen Reactions  . Vicodin [Hydrocodone-Acetaminophen] Nausea And Vomiting  . Morphine Rash  . Morphine And Related Rash    Allergic reaction only in iv form    HOME  MEDICATIONS: Outpatient Prescriptions Prior to Visit  Medication Sig Dispense Refill  . Ascorbic Acid (VITAMIN C) 1000 MG tablet Take 1,000 mg by mouth daily.     . Calcium Carbonate-Vitamin D 600-400 MG-UNIT per tablet Take 1 tablet by mouth daily.     [name] DIAZEPAM RE Place 20 mg rectally. Rectally as needed for seizure lasting longer than 2 minutes    . ferrous sulfate 325 (65 FE) MG tablet Take 325 mg by mouth daily with breakfast.    . FLUoxetine (PROZAC) 20 MG capsule Take 1 capsule (20 mg total) by mouth See admin instructions. Take 1 capsule in the morning and 2 capsules in the evening. 90 capsule 2  . levothyroxine (SYNTHROID, LEVOTHROID) 50 MCG tablet Take 50 mcg by mouth daily before breakfast.    . oxyCODONE-acetaminophen (PERCOCET/ROXICET) 5-325 MG per tablet Take 1 tablet by mouth every 6 (six) hours as needed for severe pain. 12 tablet 0  . promethazine (PHENERGAN) 25 MG tablet Take 25 mg by mouth every 6 (six) hours as needed. nausea    . warfarin (COUMADIN) 5 MG tablet Take 5 mg by mouth 2 (two) times daily.    [name] lamoTRIgine (LAMICTAL) 200 MG tablet Take 2 tablets (400 mg total) by mouth 2 (two) times daily. 120 tablet 6  . levETIRAcetam (KEPPRA) 1000 MG tablet Take 2,000 mg by mouth 2 (two) times daily.    [name] VIMPAT 200 MG TABS tablet TAKE 1 TABLET BY MOUTH TWICE A DAY 60 tablet 5   No facility-administered medications prior to visit.    PAST MEDICAL HISTORY: Past Medical History  Diagnosis Date  . Anxiety   .  Depression   . Seizures   . CVA (cerebrovascular accident due to intracerebral hemorrhage) 2004  . Arthritis   . MRSA (methicillin resistant Staphylococcus aureus)   . S/P BKA (below knee amputation) unilateral     Right   . Thyroid disease   . Cancer     Osteosarcoma, right leg  . 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 DVT (deep venous thrombosis)     Left leg    PAST SURGICAL HISTORY: Past Surgical History  Procedure Laterality Date   . Fracture surgery      Rt. femur, folllowed by MRSA  . Brain surgery  2004    cerebral aneurysm  . Total knee arthroplasty Right   . Above knee leg amputation Right     Osteosarcoma  VNS placement 12/2013  FAMILY HISTORY: Family History  Problem Relation Age of Onset  . Anxiety disorder Mother   . Diabetes Father     SOCIAL HISTORY: History   Social History  . Marital Status: Legally Separated    Spouse Name: N/A    Number of Children: 3  . Years of Education: GED   Occupational History  . Not on file.   Social History Main Topics  . Smoking status: Never Smoker   . Smokeless tobacco: Never Used     Comment: never used tobacco  . Alcohol Use: No  . Drug Use: No  . Sexual Activity: Not Currently   Other Topics Concern  . Not on file   Social History Narrative   Patient lives at home with mom.    Patient has GED.    Patient has 3 children.    Patient is married but in the process of divorced.            PHYSICAL EXAM  Filed Vitals:   01/09/14 1457  BP: 111/73  Pulse: 81  Temp: 97.9 F (36.6 C)  TempSrc: Oral  Resp: 16  Height: 5\' 7"  (1.702 m)  Weight: 185 lb 9.6 oz (84.188 kg)   Body mass index is 29.06 kg/(m^2). General: The patient is alert and cooperative at the time of the examination.  Skin: No significant peripheral edema is noted.  Neurologic Exam  Mental status: The patient is oriented x 3.  Cranial nerves: Facial symmetry is present. Speech is normal, no aphasia or dysarthria is noted. Extraocular movements are notable for restriction of superior gaze. On primary gaze, there is exotropia of the left eye. Visual fields are full.  Motor: The patient has good strength in all 4 extremities.  Sensory examination: Soft touch sensation on the legs, arms, and face is symmetric  Coordination: The patient has good finger-nose-finger and heel-to-shin bilaterally.  Gait and station: The patient has a Right above-knee amputation, no  prosthesis. The patient could not be ambulated.  Reflexes: Deep tendon reflexes are symmetric in the arms, cannot get reflexes on the right leg secondary to above-knee amputation.   DIAGNOSTIC DATA (LABS, IMAGING, TESTING) - I reviewed patient records, labs, notes, testing and imaging myself where available.  Lab Results  Component Value Date   WBC 3.7* 12/20/2013   HGB 13.0 12/20/2013   HCT 40.1 12/20/2013   MCV 90.1 12/20/2013   PLT 196 12/20/2013      Component Value Date/Time   NA 138 07/11/2013 1346   NA 139 05/05/2013 1217   NA 139 10/24/2012 1031   K 3.6 07/11/2013 1346   K 3.9 05/05/2013 1217   CL 97* 07/11/2013 1346  CL 99 05/05/2013 1217   CO2 31 07/11/2013 1346   CO2 23 02/22/2013 0455   GLUCOSE 126* 07/11/2013 1346   GLUCOSE 75 05/05/2013 1217   GLUCOSE 75 10/24/2012 1031   BUN 16 07/11/2013 1346   BUN 11 05/05/2013 1217   BUN 11 10/24/2012 1031   CREATININE 1.0 07/11/2013 1346   CREATININE 0.90 05/05/2013 1217   CALCIUM 10.0 07/11/2013 1346   CALCIUM 8.8 02/22/2013 0455   CALCIUM 8.4 12/26/2009 1025   PROT 7.6 07/11/2013 1346   PROT 7.7 02/17/2013 0313   PROT 6.9 10/24/2012 1031   ALBUMIN 3.8 02/17/2013 0313   AST 26 07/11/2013 1346   AST 26 02/17/2013 0313   ALT 27 07/11/2013 1346   ALT 33 02/17/2013 0313   ALKPHOS 102* 07/11/2013 1346   ALKPHOS 105 02/17/2013 0313   BILITOT 0.40 07/11/2013 1346   BILITOT 0.2* 02/17/2013 0313   GFRNONAA 75* 02/22/2013 0455   GFRAA 86* 02/22/2013 0455   ASSESSMENT AND PLAN  48 y.o. year old female  has a past medical history of Anxiety; Depression; Seizures; CVA (cerebrovascular accident due to intracerebral hemorrhage) (2004);  MRSA (methicillin resistant Staphylococcus aureus); S/P BKA (below knee amputation) unilateral; Unspecified epilepsy with intractable epilepsy (02/28/2013); and DVT (deep venous thrombosis). here to follow-up for intractable seizure disorder. Recent placement of vagal nerve stimulator October  26/2015.    Continue Keppra 1000mg , 2 tablets twice daily Continue Lamictal 200mg  2 tabs twice daily Continue Vimpat 200mg  twice daily Follow-up in 6 months, will renew seizure medications Dennie Bible, [facility], [facility], APRN  [facility] [address] [place], [place] [address] [phone]

## 2014-01-09 NOTE — Patient Instructions (Addendum)
Continue Keppra 1000mg , 2 tablets twice daily Continue Lamictal 200mg  2 tabs twice daily Continue Vimpat 200mg  twice daily Follow-up in 6 months, will renew seizure medications

## 2014-01-12 ENCOUNTER — Other Ambulatory Visit: Payer: Self-pay | Admitting: Nurse Practitioner

## 2014-01-24 ENCOUNTER — Telehealth: Payer: Self-pay

## 2014-01-24 NOTE — Telephone Encounter (Signed)
S/w mother, she has a home INR machine and is monitoring INR.

## 2014-01-25 ENCOUNTER — Ambulatory Visit (HOSPITAL_COMMUNITY): Payer: Self-pay | Admitting: Psychology

## 2014-02-19 ENCOUNTER — Telehealth: Payer: Self-pay | Admitting: Hematology

## 2014-02-19 NOTE — Telephone Encounter (Signed)
, °

## 2014-02-28 DIAGNOSIS — H539 Unspecified visual disturbance: Secondary | ICD-10-CM | POA: Insufficient documentation

## 2014-03-06 ENCOUNTER — Encounter (HOSPITAL_COMMUNITY): Payer: Self-pay | Admitting: Psychiatry

## 2014-03-06 ENCOUNTER — Ambulatory Visit (INDEPENDENT_AMBULATORY_CARE_PROVIDER_SITE_OTHER): Payer: Medicare Other | Admitting: Psychiatry

## 2014-03-06 VITALS — BP 128/75 | HR 81 | Ht 67.5 in | Wt 180.0 lb

## 2014-03-06 DIAGNOSIS — F321 Major depressive disorder, single episode, moderate: Secondary | ICD-10-CM

## 2014-03-06 DIAGNOSIS — R45851 Suicidal ideations: Secondary | ICD-10-CM

## 2014-03-06 MED ORDER — FLUOXETINE HCL 20 MG PO CAPS: 20.0000 mg | ORAL_CAPSULE | ORAL | Status: DC

## 2014-03-06 NOTE — Progress Notes (Signed)
[name] Progress Note  Cassandra Andrade [id number] 49 y.o.  03/06/2014 10:57 AM  Chief Complaint:  Medication management and followup.      History of Present Illness: Cassandra Andrade came for her followup appointment with her mother. She is taking her medication as prescribed.  She takes Prozac 20 mg in the morning and 40 mg in the evening.  She endorse much improvement in her mood irritability and crying spells.  She is a month when he was about lowering the dose in the future if she continues to do better.  She also thinking about going back to school.  Patient denies any irritability or any anger.  She sleeping good.  She saw her 3 kids on Christmas who now lives in [place].  Patient felt much better off is seeing them.  She denies any hallucination, paranoia or any aggression.  She continues to have small seizures but they are less intense and less frequent.  Patient denies any feeling of hopelessness or worthlessness.  Her appetite is okay.  Her vitals are stable.  Patient lives with her mother who is very supportive.  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  She has history of MRSA resulted right knee amputation.  She has hypothyroidism, chronic neuropathy pain , headache and seizure disorder.  She see Dr. [name] for seizures.  Her primary care is [name] at [facility].    Outpatient Encounter  Prescriptions as of 03/06/2014  Medication Sig  . Ascorbic Acid (VITAMIN C) 1000 MG tablet Take 1,000 mg by mouth daily.   . Calcium Carbonate-Vitamin D 600-400 MG-UNIT per tablet Take 1 tablet by mouth daily.   [name] DIAZEPAM RE Place 20 mg rectally. Rectally as needed for seizure lasting longer than 2 minutes  . ferrous sulfate 325 (65 FE) MG tablet Take 325 mg by mouth daily with breakfast.  . FLUoxetine (PROZAC) 20 MG capsule Take 1 capsule (20 mg total) by mouth See admin instructions. Take 1 capsule in the morning and 2 capsules in the evening.  . lacosamide (VIMPAT) 200 MG TABS tablet Take 1 tablet (200 mg total) by mouth 2 (two) times daily.  [name] lamoTRIgine (LAMICTAL) 200 MG tablet Take 2 tablets (400 mg total) by mouth 2 (two) times daily.  [name] levETIRAcetam (KEPPRA) 1000 MG tablet Take 2 tablets (2,000 mg total) by mouth 2 (two) times daily.  [name] levothyroxine (SYNTHROID, LEVOTHROID) 50 MCG tablet Take 50 mcg by mouth daily before breakfast.  . oxyCODONE-acetaminophen (PERCOCET/ROXICET) 5-325 MG per tablet Take 1 tablet by mouth every 6 (six) hours as needed for severe pain.  . promethazine (PHENERGAN) 25 MG tablet Take 25 mg by mouth every 6 (six) hours as needed. nausea  . warfarin (COUMADIN) 5 MG tablet Take 5 mg by mouth 2 (two) times daily.  . [DISCONTINUED] FLUoxetine (PROZAC) 20 MG capsule Take 1 capsule (20 mg total) by mouth See admin instructions. Take 1 capsule in the morning and 2 capsules in the evening.   Past  Psychiatric History/Hospitalization(s): Patient has at least one psychiatric admission in 2011 due to overdose on her pain medication.  At that time she has a marital conflict and having argument with her husband.  She denies any history of mania psychosis hallucination.  In the past she has given Zoloft and Remeron however she do not remember the details.  She remembered taking Lexapro and Abilify but it was stopped because lack of response.   Anxiety: Yes Bipolar Disorder:  No Depression: Yes Mania: No Psychosis: No Schizophrenia: No Personality Disorder: No Hospitalization for psychiatric illness: Yes History of Electroconvulsive Shock Therapy: No Prior Suicide Attempts: Yes  Physical Exam: Constitutional:  BP 128/75 mmHg  Pulse 81  Ht 5' 7.5" (1.715 m)  Wt 180 lb (81.647 kg)  BMI 27.76 kg/m2  General Appearance: alert, oriented, no acute distress and well nourished  Recent Results (from the past 2160 hour(s))  D-dimer, quantitative     Status: None   Collection Time: 12/20/13 11:02 AM  Result Value Ref Range   D-Dimer, Quant 0.29 0.00 - 0.48 ug/mL-FEU    Comment: At the inhouse established cutoff value of 0.48 ug/mL FEU, thismethology has been documented in the literature to have a sensitivityand negative predictive value of at least 98-99%.  The test resultshould be correlated with an assessment of the clinical  probability ofDVT/VTE.  Lupus anticoagulant panel     Status: Abnormal   Collection Time: 12/20/13 11:02 AM  Result Value Ref Range   PTT Lupus Anticoagulant 53.5 (H) 28.0 - 43.0 secs   PTTLA 4:1 Mix 48.0 (H) 28.0 - 43.0 secs   PTTLA Confirmation 2.5 <8.0 secs   DRVVT 42.8 <42.9 secs   DRVVT 1:1 Mix NOT APPL <42.9 secs   Drvvt confirmation NOT APPL <1.15 Ratio   Lupus Anticoagulant NOT DETECTED NOT DETECTED  CBC with Differential     Status: Abnormal   Collection Time: 12/20/13 11:02 AM  Result Value Ref Range   WBC 3.7 (L) 3.9 - 10.3 10e3/uL   NEUT# 1.9 1.5 - 6.5 10e3/uL   HGB 13.0 11.6 - 15.9 g/dL   HCT 40.1 34.8 - 46.6 %   Platelets 196 145 - 400 10e3/uL   MCV 90.1 79.5 - 101.0 fL   MCH 29.2 25.1 - 34.0 pg   MCHC 32.4 31.5 - 36.0 g/dL   RBC 4.45 3.70 - 5.45 10e6/uL   RDW 13.6 11.2 - 14.5 %   lymph# 1.4 0.9 - 3.3 10e3/uL   MONO# 0.3 0.1 - 0.9 10e3/uL   Eosinophils Absolute 0.1 0.0 - 0.5 10e3/uL   Basophils Absolute 0.0 0.0 - 0.1 10e3/uL   NEUT% 51.0 38.4 - 76.8 %   LYMPH% 38.3 14.0 - 49.7 %   MONO% 9.1 0.0 - 14.0 %    EOS% 1.3 0.0 - 7.0 %   BASO% 0.3 0.0 - 2.0 %    Musculoskeletal: Strength & Muscle Tone: decreased Gait & Station: Patient is wheelchair-bound.  She has the right below-knee amputation. Patient leans: See above  Psychiatric: Speech (describe rate, volume, coherence, spontaneity, and abnormalities if any): Soft with decreased volume and tone.    Thought Process (describe rate, content, abstract reasoning, and computation): Logical and goal-directed.    Associations: Intact, no delusions or any paranoia.  Her fund of knowledge is average.    Thoughts: normal and No active or passive suicidal thoughts or homicidal thoughts.  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  At this time I will continue Prozac 20 mg in the morning and 40 mg in the evening however on her next appointment we will consider reducing the dose if patient continues to do better.  Discussed medication side effects and benefits.  She is seeing Jan Fireman and her next appointment is on January 26.  Recommended to keep appointment for coping and social skills.  Continue Prozac 20 mg in the morning and 40 mg at bedtime.  She is taking Lamictal , vimpat and Keppra from her neurologist.  Recommended to call us back if she has any question, concern or if she feels worsening of the symptoms.  I will see her again in 3 months.  Charles Niese T., MD 03/06/2014

## 2014-03-13 ENCOUNTER — Ambulatory Visit: Payer: Self-pay

## 2014-03-13 ENCOUNTER — Other Ambulatory Visit: Payer: Self-pay

## 2014-03-19 ENCOUNTER — Ambulatory Visit (INDEPENDENT_AMBULATORY_CARE_PROVIDER_SITE_OTHER): Payer: Medicare Other | Admitting: Psychology

## 2014-03-19 DIAGNOSIS — F330 Major depressive disorder, recurrent, mild: Secondary | ICD-10-CM

## 2014-03-19 DIAGNOSIS — F339 Major depressive disorder, recurrent, unspecified: Secondary | ICD-10-CM | POA: Insufficient documentation

## 2014-03-19 NOTE — Progress Notes (Signed)
   THERAPIST PROGRESS NOTE  Session Time: 12.40pm-1.40pm  Participation Level: Active  Behavioral Response: Well GroomedAlertDepressed  Type of Therapy: Individual Therapy  Treatment Goals addressed: Diagnosis: MDD and goal 1.  Interventions: CBT, Solution Focused and Supportive  Summary: Ardath Lepak is a 49 y.o. female who presents with slightly depressed mood and affect.  Pt reported that last week she was in a very good mood- this week less so.  Pt reports at times depressed mood mostly getting frustrated on lack of follow through with things she wants to do.  Pt acknowledged that w/ struggle of memory and recall that this impacts reminding herself to complete the tasks she identifies w/ personal goals.  Pt also discussed the progress she is making. Pt identified positives in herself and discussed acceptance of self doesn't equal giving up.  Pt reported that she wants to feel good about self and identified not ruminating on comparing self to past before aneurysm.  Pt agreed that use of calendar to assist w/ agenda of steps she want to take towards plans would be beneficial.    Suicidal/Homicidal: Nowithout intent/plan  Therapist Response: Assessed pt current functioning per pt report.  Processed w/pt her mood and any contributing factors.  Assisted pt w/ identifying self compassion and acceptance despite limitations.  Explored w/ pt her ways she reframes when negative self talk and ruminating thinking begins.  Focused w/ pt on recognizing may need more support to accomplish her plans towards dieting, school, volunteer work given struggles w/ memory.  Discussed use of calendar to assist herself.  Developed new tx plan.   Plan: Return again in 4 weeks.  Diagnosis: MDD, recurrent   YATES,LEANNE, LPC 03/19/2014

## 2014-03-25 ENCOUNTER — Telehealth: Payer: Self-pay | Admitting: Hematology

## 2014-03-25 NOTE — Telephone Encounter (Signed)
Returned called to reschedule appointmnet from 02/02 to 02/11.Patient confirmed

## 2014-03-26 ENCOUNTER — Ambulatory Visit: Payer: Self-pay | Admitting: Hematology

## 2014-03-26 ENCOUNTER — Other Ambulatory Visit: Payer: Self-pay

## 2014-04-03 ENCOUNTER — Other Ambulatory Visit: Payer: Self-pay | Admitting: *Deleted

## 2014-04-03 DIAGNOSIS — I82403 Acute embolism and thrombosis of unspecified deep veins of lower extremity, bilateral: Secondary | ICD-10-CM

## 2014-04-04 ENCOUNTER — Telehealth: Payer: Self-pay | Admitting: Hematology

## 2014-04-04 ENCOUNTER — Other Ambulatory Visit (HOSPITAL_BASED_OUTPATIENT_CLINIC_OR_DEPARTMENT_OTHER): Payer: Medicare Other

## 2014-04-04 ENCOUNTER — Ambulatory Visit (HOSPITAL_BASED_OUTPATIENT_CLINIC_OR_DEPARTMENT_OTHER): Payer: Medicare Other | Admitting: Hematology

## 2014-04-04 VITALS — BP 107/66 | HR 80 | Temp 98.3°F | Resp 18 | Ht 67.5 in

## 2014-04-04 DIAGNOSIS — D6862 Lupus anticoagulant syndrome: Secondary | ICD-10-CM

## 2014-04-04 DIAGNOSIS — I82403 Acute embolism and thrombosis of unspecified deep veins of lower extremity, bilateral: Secondary | ICD-10-CM

## 2014-04-04 DIAGNOSIS — D649 Anemia, unspecified: Secondary | ICD-10-CM

## 2014-04-04 DIAGNOSIS — Z86718 Personal history of other venous thrombosis and embolism: Secondary | ICD-10-CM

## 2014-04-04 LAB — PROTIME-INR
INR: 2.7 (ref 2.00–3.50)
Protime: 32.4 Seconds — ABNORMAL HIGH (ref 10.6–13.4)

## 2014-04-04 LAB — CBC WITH DIFFERENTIAL/PLATELET
BASO%: 0.7 % (ref 0.0–2.0)
BASOS ABS: 0 10*3/uL (ref 0.0–0.1)
EOS ABS: 0.1 10*3/uL (ref 0.0–0.5)
EOS%: 1.6 % (ref 0.0–7.0)
HCT: 38.3 % (ref 34.8–46.6)
HEMOGLOBIN: 11.8 g/dL (ref 11.6–15.9)
LYMPH%: 36.9 % (ref 14.0–49.7)
MCH: 27.9 pg (ref 25.1–34.0)
MCHC: 30.8 g/dL — ABNORMAL LOW (ref 31.5–36.0)
MCV: 90.6 fL (ref 79.5–101.0)
MONO#: 0.4 10*3/uL (ref 0.1–0.9)
MONO%: 9.3 % (ref 0.0–14.0)
NEUT#: 2.1 10*3/uL (ref 1.5–6.5)
NEUT%: 51.5 % (ref 38.4–76.8)
Platelets: 218 10*3/uL (ref 145–400)
RBC: 4.23 10*6/uL (ref 3.70–5.45)
RDW: 13.9 % (ref 11.2–14.5)
WBC: 4 10*3/uL (ref 3.9–10.3)
lymph#: 1.5 10*3/uL (ref 0.9–3.3)

## 2014-04-04 NOTE — Progress Notes (Signed)
[facility]  [phone] Fax:([phone]) [place] UP VISIT Date of Service: 12/20/2013   ID: Cassandra Andrade OB: [birth year] MR#: [id number] RPR#:[id number] Patient Care Team: Fran Lowes, MD as PCP - General Kathlee Nations, MD as Consulting Physician (Psychiatry)  REASON FOR OFFICE VISIT:  1. Bilateral lower extremity deep venous thrombosis.  2. Right below-knee amputation for osteogenic sarcoma.  3. Multiple miscarriages.  4. Positive lupus anticoagulant, repeated negative   PATIENT IDENTIFICATION  Cassandra Andrade is a pleasant 49 years old African-American female who is coming to established here at [facility]. Previously she was being seen by Dr. Burney Gauze at [facility] location. 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 She had radiation and chemotherapy. Should the surgery with the bone prosthesis. 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 It also causes her some memory problems. She was in coma for a while and she has a tracheostomy done and was placed in a long-term care facility. She has history of multiple miscarriages. She does have 3 children. She also had a right AKA because of the right lower leg infection Iin2013. 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 She was initially evaluated by Dr. Burney Gauze on 12/01/2012. She is wheelchair bound. She also have history of situational depression, iron deficiency anemia and hypothyroidism. 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 He also takes antiseizure therapy is no known family history of thromboembolic problems. She had couple of venous duplex studies done last one was on 09/19/2013 showing similar appearing residual chronic thrombus within the common femoral and femoral veins bilaterally. No significant interval change from 04/04/2013  patient's mother have a machine at home where they check her INR and the target INR is between 2-3. Her current Coumadin dose 10 mg daily and the results are being called out to physician who regulates the Coumadin. Patient and her mother would like to continue the same arrangement although I offered her that we have a dedicated Coumadin clinic and we can help in Coumadin regulation if they desire. Because of a positive lupus anticoagulant, a prior history of osteosarcoma, generalized deconditioning and wheelchair bound status, and presence of chronic thrombus on vascular imaging, I would agree to continuation of Coumadin long-term as recommended by Dr. Marin Olp.  INTERIM HISTORY: Cassandra Andrade is back today with her sister for a follow-up. She is doing well overall. She is tolerating Coumadin well, no episodes of bleeding. She is still wheelchair bound most of time, only use A walker for ambulatory occasionally. She is planning to have a right lobe extremity prosthesis in the near future. She denies any new symptoms, no left leg swollen, no dyspnea on exertion.  CURRENT TREATMENT: Coumadin 10 mg  to maintain INR between 2-3  REVIEW OF SYSTEMS: All other 10 point review of systems is negative.    PAST MEDICAL HISTORY: Past Medical History  Diagnosis Date  . Anxiety   . Depression   . Seizures   . CVA (cerebrovascular accident due to intracerebral hemorrhage) 2004  . Arthritis   . MRSA (methicillin resistant Staphylococcus aureus)   . S/P BKA (below knee amputation) unilateral     Right   . Thyroid disease   . Cancer     Osteosarcoma, right leg  . 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 DVT (deep venous thrombosis)     Left leg   PAST SURGICAL HISTORY: Past Surgical History  Procedure Laterality Date  . Fracture surgery      Rt. femur, folllowed by MRSA  . Brain surgery  2004    cerebral aneurysm  . Total knee arthroplasty Right   . Above knee leg amputation Right      Osteosarcoma   FAMILY HISTORY Family History  Problem Relation Age of Onset  . Anxiety disorder Mother   . Diabetes Father    GYNECOLOGIC HISTORY:  No LMP recorded. Patient has had an ablation.   SOCIAL HISTORY:  History   Social History  . Marital Status: Legally Separated    Spouse Name: N/A  . Number of Children: 3  . Years of Education: GED   Occupational History  . Not on file.   Social History Main Topics  . Smoking status: Never Smoker   . Smokeless tobacco: Never Used     Comment: never used tobacco  . Alcohol Use: No  . Drug Use: No  . Sexual Activity: Not Currently   Other Topics Concern  . Not on file   Social History Narrative   Patient lives at home with mom.    Patient has GED.    Patient has 3 children.    Patient is married but in the process of divorced.          ADVANCED DIRECTIVES: <no information>  HEALTH MAINTENANCE: History  Substance Use Topics  . Smoking status: Never Smoker   . Smokeless tobacco: Never Used     Comment: never used tobacco  . Alcohol Use: No   Colonoscopy: PAP: Bone density: Lipid panel:  Allergies  Allergen Reactions  . Vicodin [Hydrocodone-Acetaminophen] Nausea And Vomiting  . Morphine Rash  . Morphine And Related Rash    Allergic reaction only in iv form    Current Outpatient Prescriptions  Medication Sig Dispense Refill  . Ascorbic Acid (VITAMIN C) 1000 MG tablet Take 1,000 mg by mouth daily.     . Calcium Carbonate-Vitamin D 600-400 MG-UNIT per tablet Take 1 tablet by mouth daily.     [name] DIAZEPAM RE Place 20 mg rectally. Rectally as needed for seizure lasting longer than 2 minutes    . ferrous sulfate 325 (65 FE) MG tablet Take 325 mg by mouth daily with breakfast.    . FLUoxetine (PROZAC) 20 MG capsule Take 1 capsule (20 mg total) by mouth See admin instructions. Take 1 capsule in the morning and 2 capsules in the evening. 90 capsule 2  . lacosamide (VIMPAT) 200 MG TABS tablet Take 1 tablet (200 mg  total) by mouth 2 (two) times daily. 60 tablet 5  . lamoTRIgine (LAMICTAL) 200 MG tablet Take 2 tablets (400 mg total) by mouth 2 (two) times daily. 120 tablet 6  . levETIRAcetam (KEPPRA) 1000 MG tablet Take 2 tablets (2,000 mg total) by mouth 2 (two) times daily. 120 tablet 6  . levothyroxine (SYNTHROID, LEVOTHROID) 50 MCG tablet Take 50 mcg by mouth daily before breakfast.    . promethazine (PHENERGAN) 25 MG tablet Take 25 mg by mouth every 6 (six) hours as needed. nausea    . warfarin (COUMADIN) 5 MG tablet Take 5 mg by mouth 2 (two) times daily.     No current facility-administered medications for this visit.   OBJECTIVE: Filed Vitals:   04/04/14 1423  BP: 107/66  Pulse: 80  Temp: 98.3  F (36.8 C)  Resp: 18   Body mass index is 0.00 kg/(m^2). ECOG FS: 0 Ocular: Sclerae unicteric, pupils equal, round and reactive to light Ear-nose-throat: Oropharynx clear, dentition fair Lymphatic: No cervical or supraclavicular adenopathy Lungs no rales or rhonchi, good excursion bilaterally Heart regular rate and rhythm, no murmur appreciated Abd soft, nontender, positive bowel sounds MSK no focal spinal tenderness, no joint edema. (+) right AKA: 4 bodymedial margin Neuro: non-focal, well-oriented, appropriate affect Breasts: Deferred  LAB RESULTS:  No results found for: SPEP Lab Results  Component Value Date   WBC 4.0 04/04/2014   NEUTROABS 2.1 04/04/2014   HGB 11.8 04/04/2014   HCT 38.3 04/04/2014   MCV 90.6 04/04/2014   PLT 218 04/04/2014   CMP Latest Ref Rng 07/11/2013 05/05/2013 02/22/2013  Glucose 73 - 118 mg/dL 126(H) 75 119(H)  BUN 7 - 22 mg/dL 16 11 13   Creatinine 0.6 - 1.2 mg/dl 1.0 0.90 0.91  Sodium 128 - 145 mEq/L 138 139 142  Potassium 3.3 - 4.7 mEq/L 3.6 3.9 3.9  Chloride 98 - 108 mEq/L 97(L) 99 104  CO2 18 - 33 mEq/L 31 - 23  Calcium 8.0 - 10.3 mg/dL 10.0 - 8.8  Total Protein 6.4 - 8.1 g/dL 7.6 - -  Albumin 3.3 - 5.5 g/dL 3.4 - -  Total Bilirubin 0.20 - 1.60 mg/dl  0.40 - -  Alkaline Phos 26 - 84 U/L 102(H) - -  AST 11 - 38 U/L 26 - -  ALT 10 - 47 U/L 27 - -   Lupus anticoagulant panel  Status: Finalresult Visible to patient:  MyChart Nextappt: 04/23/2014 at 01:30 PM in [facility] (Andrade,Cassandra Andrade, LPC) Dx:  DVT, bilateral lower limbs              Ref Range 70mo ago  53mo ago     PTT Lupus Anticoagulant 28.0 - 43.0 secs 53.5 (H) 61.4 (H)    PTTLA 4:1 Mix 28.0 - 43.0 secs 48.0 (H) 48.2 (H)    PTTLA Confirmation <8.0 secs 2.5 6.2    DRVVT <42.9 secs 42.8 52.7 (H)    DRVVT 1:1 Mix <42.9 secs NOT APPL 37.0    Drvvt confirmation <1.15 Ratio NOT APPL NOT APPL    Lupus Anticoagulant NOT DETECTED  NOT DETECTED NOT DETECTED           STUDIES: US Venous Img Lower Bilateral  09/19/2013   CLINICAL DATA:  Chronic bilateral DVT  EXAM: BILATERAL LOWER EXTREMITY VENOUS DOPPLER ULTRASOUND  TECHNIQUE: Gray-scale sonography with graded compression, as well as color Doppler and duplex ultrasound were performed to evaluate the lower extremity deep venous systems from the level of the common femoral vein and including the common femoral, femoral, profunda femoral, popliteal and calf veins including the posterior tibial, peroneal and gastrocnemius veins when visible. The superficial great saphenous vein was also interrogated. Spectral Doppler was utilized to evaluate flow at rest and with distal augmentation maneuvers in the common femoral, femoral and popliteal veins.  COMPARISON:  None.  FINDINGS: Deep veins: Bilateral lower extremity deep veins demonstrate similar recanalized thrombus with mural thrombus seen throughout both common femoral veins and the femoral veins of the thigh. No significant popliteal or tibial thrombus is identified on the left. The patient is status post amputation of the right lower leg.  Compressibility of deep veins: Partially compressible.  Duplex waveform respiratory phasicity:  Diminished but Normal.  Augmentation  not performed.  Venous reflux: None visualized.  Other findings: No evidence  of superficial thrombophlebitis or abnormal fluid collection.  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 No significant interval change.   Electronically Signed   By: Daryll Brod M.D.   On: 09/19/2013 12:26    ASSESSMENT/PLAN:  Cassandra Andrade is a 81 her old [place] American female.  1. History of DVT, transient Lupus anticoagulant positive, repeat tests were negative. -She is wheelchair bound due to her right lower extremity amputation above knee. She has certainly high risks for thrombosis due to her immobilization. I recommend her long-term until correlation with Coumadin. If she is able to ambulatory again after prosthesis placement, we could consider taking her off Coumadin in the future. -I explained to her that her repeated Lupus anticoagulant test was negative. -She is young, no additional risks of bleeding when she is on Coumadin.  2. Right lower extremity osteogenic cell,, status post amputation above knee. -Doing well, no evidence of recurrence.  Follow-up: 3 months with lab.  All questions were answered and she is in agreement with the plan. She knows to call here with any questions or concerns and to go to the ED in the event of an emergency. We can certainly see her sooner if need be.   Truitt Merle  04/05/2014

## 2014-04-04 NOTE — Telephone Encounter (Signed)
gv adn pritned avs for May

## 2014-04-04 NOTE — Progress Notes (Addendum)
[place]  [phone] Fax:([phone]) [place] [facility] VISIT Date of Service: 12/20/2013   ID: Coletta Memos OB: 1966/01/17 MR#: [id number] TMH#:[id number] Patient Care Team: Fran Lowes, MD as PCP - General Kathlee Nations, MD as Consulting Physician (Psychiatry)  REASON FOR OFFICE VISIT:  1. Bilateral lower extremity deep venous thrombosis.  2. Right below-knee amputation for osteogenic sarcoma.  3. Multiple miscarriages.  4. Positive lupus anticoagulant.  PATIENT IDENTIFICATION  Cassandra Andrade is a pleasant 49 years old African-American female who is coming to established here at [facility]. Previously she was being seen by Dr. Burney Gauze at [facility] location. 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 She had radiation and chemotherapy. Should the surgery with the bone prosthesis. 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 It also causes her some memory problems. She was in coma for a while and she has a tracheostomy done and was placed in a long-term care facility. She has history of multiple miscarriages. She does have 3 children. She also had a right AKA because of the right lower leg infection Iin2013. 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 She was initially evaluated by Dr. Burney Gauze on 12/01/2012. She is wheelchair bound. She also have history of situational depression, iron deficiency anemia and hypothyroidism. 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 He also takes antiseizure therapy is no known family history of thromboembolic problems. She had couple of venous duplex studies done last one was on 09/19/2013 showing similar appearing residual chronic thrombus within the common femoral and femoral veins bilaterally. No significant interval change from 04/04/2013 patient's mother have  a machine at home where they check her INR and the target INR is between 2-3. Her current Coumadin dose 10 mg daily and the results are being called out to physician who regulates the Coumadin.   She returns for follow-up. She comes in a wheelchair. She has been tolerating Coumadin very well, no bleeding. She is wheelchair bound most of time, occasionally use walker. She is planning to get a new prosthesis to be able to move around. She denies any new leg swollen, dyspnea or other new symptoms.  CURRENT TREATMENT: Coumadin 10 mg  to maintain INR between 2-3  REVIEW OF SYSTEMS: All other 10 point review of systems is negative.    PAST MEDICAL HISTORY: Past Medical History  Diagnosis Date  . Anxiety   . Depression   . Seizures   . CVA (cerebrovascular accident due to intracerebral hemorrhage) 2004  . Arthritis   . MRSA (methicillin resistant Staphylococcus aureus)   . S/P BKA (below knee amputation) unilateral     Right   . Thyroid disease   . Cancer     Osteosarcoma, right leg  . 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 DVT (deep venous thrombosis)     Left leg   PAST SURGICAL HISTORY: Past Surgical History  Procedure Laterality Date  . Fracture surgery      Rt. femur, folllowed by MRSA  . Brain surgery  2004    cerebral aneurysm  . Total knee arthroplasty Right   . Above knee leg amputation Right     Osteosarcoma   FAMILY HISTORY Family History  Problem Relation Age of Onset  . Anxiety disorder  Mother   . Diabetes Father    GYNECOLOGIC HISTORY:  No LMP recorded. Patient has had an ablation.   SOCIAL HISTORY:  History   Social History  . Marital Status: Legally Separated    Spouse Name: N/A  . Number of Children: 3  . Years of Education: GED   Occupational History  . Not on file.   Social History Main Topics  . Smoking status: Never Smoker   . Smokeless tobacco: Never Used     Comment: never used tobacco  . Alcohol Use: No  . Drug Use:  No  . Sexual Activity: Not Currently   Other Topics Concern  . Not on file   Social History Narrative   Patient lives at home with mom.    Patient has GED.    Patient has 3 children.    Patient is married but in the process of divorced.          ADVANCED DIRECTIVES: <no information>  HEALTH MAINTENANCE: History  Substance Use Topics  . Smoking status: Never Smoker   . Smokeless tobacco: Never Used     Comment: never used tobacco  . Alcohol Use: No   Colonoscopy: PAP: Bone density: Lipid panel:  Allergies  Allergen Reactions  . Vicodin [Hydrocodone-Acetaminophen] Nausea And Vomiting  . Morphine Rash  . Morphine And Related Rash    Allergic reaction only in iv form    Current Outpatient Prescriptions  Medication Sig Dispense Refill  . Ascorbic Acid (VITAMIN C) 1000 MG tablet Take 1,000 mg by mouth daily.     . Calcium Carbonate-Vitamin D 600-400 MG-UNIT per tablet Take 1 tablet by mouth daily.     [name] DIAZEPAM RE Place 20 mg rectally. Rectally as needed for seizure lasting longer than 2 minutes    . ferrous sulfate 325 (65 FE) MG tablet Take 325 mg by mouth daily with breakfast.    . FLUoxetine (PROZAC) 20 MG capsule Take 1 capsule (20 mg total) by mouth See admin instructions. Take 1 capsule in the morning and 2 capsules in the evening. 90 capsule 2  . lacosamide (VIMPAT) 200 MG TABS tablet Take 1 tablet (200 mg total) by mouth 2 (two) times daily. 60 tablet 5  . lamoTRIgine (LAMICTAL) 200 MG tablet Take 2 tablets (400 mg total) by mouth 2 (two) times daily. 120 tablet 6  . levETIRAcetam (KEPPRA) 1000 MG tablet Take 2 tablets (2,000 mg total) by mouth 2 (two) times daily. 120 tablet 6  . levothyroxine (SYNTHROID, LEVOTHROID) 50 MCG tablet Take 50 mcg by mouth daily before breakfast.    . promethazine (PHENERGAN) 25 MG tablet Take 25 mg by mouth every 6 (six) hours as needed. nausea    . warfarin (COUMADIN) 5 MG tablet Take 5 mg by mouth 2 (two) times daily.     No  current facility-administered medications for this visit.   OBJECTIVE: Filed Vitals:   04/04/14 1423  BP: 107/66  Pulse: 80  Temp: 98.3 F (36.8 C)  Resp: 18   Body mass index is 0.00 kg/(m^2). ECOG FS: 0 Ocular: Sclerae unicteric, pupils equal, round and reactive to light Ear-nose-throat: Oropharynx clear, dentition fair Lymphatic: No cervical or supraclavicular adenopathy Lungs no rales or rhonchi, good excursion bilaterally Heart regular rate and rhythm, no murmur appreciated Abd soft, nontender, positive bowel sounds MSK no focal spinal tenderness, no joint edema Neuro: non-focal, well-oriented, appropriate affect Breasts: Deferred  LAB RESULTS:  No results found for: SPEP Lab Results  Component  Value Date   WBC 4.0 04/04/2014   NEUTROABS 2.1 04/04/2014   HGB 11.8 04/04/2014   HCT 38.3 04/04/2014   MCV 90.6 04/04/2014   PLT 218 04/04/2014   D-dimer and Lupus Anticoagulant pending  STUDIES: US Venous Img Lower Bilateral  09/19/2013   CLINICAL DATA:  Chronic bilateral DVT  EXAM: BILATERAL LOWER EXTREMITY VENOUS DOPPLER ULTRASOUND  TECHNIQUE: Gray-scale sonography with graded compression, as well as color Doppler and duplex ultrasound were performed to evaluate the lower extremity deep venous systems from the level of the common femoral vein and including the common femoral, femoral, profunda femoral, popliteal and calf veins including the posterior tibial, peroneal and gastrocnemius veins when visible. The superficial great saphenous vein was also interrogated. Spectral Doppler was utilized to evaluate flow at rest and with distal augmentation maneuvers in the common femoral, femoral and popliteal veins.  COMPARISON:  None.  FINDINGS: Deep veins: Bilateral lower extremity deep veins demonstrate similar recanalized thrombus with mural thrombus seen throughout both common femoral veins and the femoral veins of the thigh. No significant popliteal or tibial thrombus is identified  on the left. The patient is status post amputation of the right lower leg.  Compressibility of deep veins: Partially compressible.  Duplex waveform respiratory phasicity:  Diminished but Normal.  Augmentation not performed.  Venous reflux: None visualized.  Other findings: No evidence of superficial thrombophlebitis or abnormal fluid collection.  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 No significant interval change.   Electronically Signed   By: Daryll Brod M.D.   On: 09/19/2013 12:26    ASSESSMENT/PLAN:  1. . History of bilateral DVT, transient Lupus anticoagulant positive, repeat tests were negative. -She is wheelchair bound due to her right lower extremity amputation above knee. She has certainly high risks for thrombosis due to her immobilization.  -I explained to her that her repeated Lupus anticoagulant test was negative. -we again discussed the benefits and risks of long-term anticoagulation. Giving her immobility and persistent chronic DVT on repeated ultrasound, I recommend long-term and the correlation with Coumadin -She will continue physical therapy to see if she can be more physically active and independent.  2. Right lower extremity osteosarcoma, status post amputation above knee. -Doing well, no evidence of recurrence.  Follow-up: 3 months with lab.  All questions were answered and she is in agreement with the plan. She knows to call here with any questions or concerns and to go to the ED in the event of an emergency. We can certainly see her sooner if need be.   Truitt Merle

## 2014-04-06 ENCOUNTER — Encounter: Payer: Self-pay | Admitting: Hematology

## 2014-04-23 ENCOUNTER — Ambulatory Visit (INDEPENDENT_AMBULATORY_CARE_PROVIDER_SITE_OTHER): Payer: Medicare Other | Admitting: Psychology

## 2014-04-23 DIAGNOSIS — F330 Major depressive disorder, recurrent, mild: Secondary | ICD-10-CM

## 2014-04-23 NOTE — Progress Notes (Signed)
   THERAPIST PROGRESS NOTE  Session Time: 1.35pm-2.16pm  Participation Level: Active  Behavioral Response: Well GroomedAlert, AFFECT bright  Type of Therapy: Individual Therapy  Treatment Goals addressed: Diagnosis: MDD and goal 1.  Interventions: CBT, Strength-based and Supportive  Summary: Cassandra Andrade is a 49 y.o. female who presents with full and bright affect.  Pt's sister was introduced and pt reported that sister has been helping out as well.  Mom reported that pt was fitted and received her new prosthesis.  Pt reported that her mood has been good and she has been focused on enjoying each day and not worrying about what is next or questioning why.  Pt reported that they moved to the apartment next door over the weekend as old apartment needed repairs due to water damage.  Pt reported that she feels good about the progress she has made w/ walking and ready to start learning w/ her prosthesis.  Pt expressed want to also address her dental work needed as doesn't feel about herself and smile.  Pt wanted support in backing up w/ her mom who f/u w/ accessing medical care.  Mom receptive to following up. Pt discussed missing her sons and exhusband at times but focused on still connecting w/ family.   Suicidal/Homicidal: Nowithout intent/plan  Therapist Response: Assessed pt current functioning per pt and parent report.  Explored w/ pt progress with her walking and progress w/ her mood.  Explored w/pt other steps she wants to take towards feeling good about self and supporting this w/ communicating to mom.  Supportive to pt about loss of marriage and discussed developmental transition of sons' growing up and separating from parents.  Validated feelings and encouraged to continue connection to family supports.   Plan: Return again in 4 weeks.  Diagnosis:  MDD      Jan Fireman, [facility] 04/23/2014

## 2014-05-04 ENCOUNTER — Other Ambulatory Visit (HOSPITAL_COMMUNITY): Payer: Self-pay | Admitting: Psychiatry

## 2014-05-06 NOTE — Telephone Encounter (Signed)
Medication refill request for patient's prescribed Prozac.  Medication was last e-scribed 03/06/14 with 2 refills and is scheduled to return 06/05/14 so too early to refill.

## 2014-05-15 ENCOUNTER — Encounter: Payer: Self-pay | Admitting: Physical Therapy

## 2014-05-15 ENCOUNTER — Ambulatory Visit: Payer: Medicare Other | Attending: Family | Admitting: Physical Therapy

## 2014-05-15 DIAGNOSIS — R531 Weakness: Secondary | ICD-10-CM | POA: Diagnosis not present

## 2014-05-15 DIAGNOSIS — M24652 Ankylosis, left hip: Secondary | ICD-10-CM | POA: Diagnosis not present

## 2014-05-15 DIAGNOSIS — R6889 Other general symptoms and signs: Secondary | ICD-10-CM | POA: Diagnosis not present

## 2014-05-15 DIAGNOSIS — Z89612 Acquired absence of left leg above knee: Secondary | ICD-10-CM | POA: Insufficient documentation

## 2014-05-15 DIAGNOSIS — R29818 Other symptoms and signs involving the nervous system: Secondary | ICD-10-CM | POA: Insufficient documentation

## 2014-05-15 DIAGNOSIS — R2689 Other abnormalities of gait and mobility: Secondary | ICD-10-CM

## 2014-05-15 DIAGNOSIS — R269 Unspecified abnormalities of gait and mobility: Secondary | ICD-10-CM | POA: Insufficient documentation

## 2014-05-15 NOTE — Therapy (Signed)
[facility] [address] [place], [place], [address] Phone: [phone]   Fax:  [phone]  Physical Therapy Evaluation  Patient Details  Name: Cassandra Andrade MRN: [id number] Date of Birth: 10-Jun-1965 Referring Provider:  Fran Lowes, MD  Encounter Date: 05/15/2014      PT End of Session - 05/15/14 1318    Visit Number 1   Number of Visits 17   Date for PT Re-Evaluation 07/12/14   PT Start Time 2585   PT Stop Time 1100   PT Time Calculation (min) 45 min   Equipment Utilized During Treatment Gait belt   Activity Tolerance Patient tolerated treatment well   Behavior During Therapy [facility] for tasks assessed/performed      Past Medical History  Diagnosis Date  . Anxiety   . Depression   . Seizures   . CVA (cerebrovascular accident due to intracerebral hemorrhage) 2004  . Arthritis   . MRSA (methicillin resistant Staphylococcus aureus)   . S/P BKA (below knee amputation) unilateral     Right   . Thyroid disease   . Cancer     Osteosarcoma, right leg  . 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 DVT (deep venous thrombosis)     Left leg    Past Surgical History  Procedure Laterality Date  . Fracture surgery      Rt. femur, folllowed by MRSA  . Brain surgery  2004    cerebral aneurysm  . Total knee arthroplasty Right   . Above knee leg amputation Right     Osteosarcoma    There were no vitals filed for this visit.  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 In 2014 underwent PT  for prosthetic training and could ambulate with family with prosthesis with walker. She had issues with bilateral LE DVTs and had to stop walking in Sept. 2014. She recieved a socket revision with new suspension system on 04/18/2014. She presents for prosthetic training to increase mobility.                        Patient Stated Goals Mother & patient want to walk with prosthesis.   Currently in Pain? No/denies            [facility] PT Assessment - 05/15/14 1015    Assessment   Medical Diagnosis right Transfemoral Amputation   Onset Date 04/18/14   Precautions   Precautions Fall  Seizures   Restrictions   Weight Bearing Restrictions No   Balance Screen   Has the patient fallen in the past 6 months Yes   How many times? 2-3 in last month   Has the patient had a decrease in activity level because of a fear of falling?  No   Is the patient reluctant to leave their home because of a fear of falling?  No  uses w/c   Home Environment   Living Enviornment Private residence   Living Arrangements Parent   Type of [place] Access Level entry   Home Layout  One level   Research scientist (life sciences) - 2 wheels;Walker - 4 wheels;Cane - single point;Shower seat;Wheelchair - manual;Hospital bed   Prior Function   Level of Independence Independent with basic ADLs;Needs assistance with gait   Cognition   Overall Cognitive Status History of cognitive impairments - at baseline   Transfers   Transfers Sit to Stand;Stand to Sit   Sit to Stand 4: Min assist;From elevated surface;With upper extremity assist;With armrests;From chair/3-in-1  to RW to stabilize   Sit to Stand Details (indicate cue type and reason) PT demo / cueing prosthetic knee control   Stand to Sit 5: Supervision;With upper extremity assist;With armrests;To chair/3-in-1  from RW   Stand to Sit Details cues / demo on prosthetic knee control   Ambulation/Gait   Ambulation/Gait Yes   Ambulation/Gait Assistance 4: Min assist    Ambulation/Gait Assistance Details manual & verbal cues on prosthesis control, step length and posture   Ambulation Distance (Feet) 60 Feet   Assistive device Prosthesis;Rolling walker   Gait Pattern Step-through pattern;Decreased step length - right;Decreased stance time - left;Decreased stride length;Decreased hip/knee flexion - left;Decreased weight shift to left;Lateral hip instability;Trunk flexed;Abducted - left;Poor foot clearance - left   Ambulation Surface Level;Indoor   Gait velocity 0.36 ft/sec   Static Standing Balance   Static Standing - Balance Support Bilateral upper extremity supported  Rolling Walker support   Static Standing - Level of Assistance 5: Stand by assistance   Static Standing - Comment/# of Minutes 2 minutes   Dynamic Standing Balance   Dynamic Standing - Balance Support Left upper extremity supported  Rolling Walker support   Dynamic Standing - Level of Assistance 4: Min assist   Dynamic Standing - Balance Activities Head nods;Head turns;Eyes opn;Reaching for objects   Dynamic Standing - Comments reaches 5"         Prosthetics Assessment - 05/15/14 1015    Prosthetics   Prosthetic Care Dependent with Skin check;Residual limb care;Prosthetic cleaning;Ply sock cleaning;Correct ply sock adjustment;Proper wear schedule/adjustment;Proper weight-bearing schedule/adjustment;Other (comment)  donning prosthesis (new suspension - suction ring)   Donning prosthesis  Min assist;Other (comment)  constant cues   Doffing prosthesis  Min assist  verbal cues to release suction   Current prosthetic wear tolerance (days/week)  2 days since delivery 27 days ago   Current prosthetic wear tolerance (#hours/day)  15 minutes   Current prosthetic weight-bearing tolerance (hours/day)  5 min with partial wt on prosthesis with no c/o pain or discomfort   Edema non-pitting   Residual limb condition  no open areas                  [facility] Adult PT Treatment/Exercise -  05/15/14 1015    Prosthetics   Education Provided Skin check;Residual limb care;Proper wear schedule/adjustment  donning & doffing, wear 2 hrs /day, only walk with family   Person(s) Educated Patient;Parent(s)   Education Method Explanation;Demonstration   Education Method Verbalized understanding;Returned demonstration;Tactile cues required;Verbal cues required;Needs further instruction   Donning Prosthesis Minimal assist   Doffing Prosthesis Minimal assist                PT Education - 05/15/14 1100    Education provided Yes   Education Details See prosthetic care   Person(s) Educated Patient;Parent(s)   Methods Explanation;Demonstration;Tactile cues;Verbal cues   Comprehension Verbalized understanding;Returned demonstration;Verbal cues required;Tactile cues required;Need further instruction          PT Short Term Goals - 05/15/14 1015  PT SHORT TERM GOAL #1   Title tolerates wear of prosthesis >50% of awake hours without any skin issues or discomfort (Target Date: 06/14/2014)   Time 4   Period Weeks   Status New   PT SHORT TERM GOAL #2   Title donnes prosthesis correctly with mother cueing / supervising (Target Date: 06/14/2014)   Time 4   Period Weeks   Status New   PT SHORT TERM GOAL #3   Title stands with rolling walker and prosthesis managing clothes for toileting with supervision. (Target Date: 06/14/2014)   Time 4   Period Weeks   Status New   PT SHORT TERM GOAL #4   Title ambulates 125' with rolling walker & prosthesis with supervision. (Target Date: 06/14/2014)   Time 4   Period Weeks   Status New           PT Long Term Goals - 2014-06-10 1015    PT LONG TERM GOAL #1   Title tolerates wear of prosthesis >75% of awake hours without change in skin integrity or tenderness. (Target Date: 07/12/2014)   Time 8   Period Weeks   Status New   PT LONG TERM GOAL #2   Title donnes prosthesis modified independent and mother verbalizes proper prosthetic care.  (Target Date: 07/12/2014)   Time 8   Period Weeks   Status New   PT LONG TERM GOAL #3   Title ambulates 250' with rolling walker & prosthesis with supervision. (Target Date: 07/12/2014)   Time 8   Period Weeks   Status New   PT LONG TERM GOAL #4   Title negotiates ramps & curbs with rolling walker and stairs with 2 hands on 1 rail with supervision. (Target Date: 07/12/2014)   Time 8   Period Weeks   Status New   PT LONG TERM GOAL #5   Title standing balance with rolling walker & prosthesis reaching 10" anteriorly, across midline and to floor with supervision. (Target Date: 07/12/2014)   Time 8   Period Weeks   Status New               Plan - 06/10/14 1100    Clinical Impression Statement Patient was able to use a prosthesis previously but then developed DVTs and has not been able to ambulate for months. She recieved a socket revision on 04/18/14 and is dependent in use of prosthesis for all gait & standing activities.   Pt will benefit from skilled therapeutic intervention in order to improve on the following deficits Abnormal gait;Decreased activity tolerance;Decreased balance;Decreased cognition;Decreased endurance;Decreased knowledge of precautions;Decreased knowledge of use of DME;Decreased mobility;Decreased range of motion   Rehab Potential Good   Clinical Impairments Affecting Rehab Potential cognition   PT Frequency 2x / week   PT Duration 8 weeks   PT Treatment/Interventions ADLs/Self Care Home Management;DME Instruction;Gait training;Stair training;Functional mobility training;Therapeutic activities;Therapeutic exercise;Balance training;Neuromuscular re-education;Patient/family education;Other (comment)  prosthetic training   PT Next Visit Plan review prosthetic care, HEP, gait with RW   PT Home Exercise Plan mid-line at sink   Consulted and Agree with Plan of Care Patient;Family member/caregiver   Family Member Consulted mother          G-Codes - 2014-06-10 1100     Functional Assessment Tool Used dependent in prosthetic care. Worn prosthesis 30 min 2 x over 27 days since delivery.   Functional Limitation Self care   Self Care Current Status (T0569) At least 80 percent but less than 100 percent impaired,  limited or restricted   Self Care Goal Status [phone]) At least 20 percent but less than 40 percent impaired, limited or restricted       Problem List Patient Active Problem List   Diagnosis Date Noted  . Major depression, recurrent 03/19/2014  . Postprocedural state 12/31/2013  . Lupus anticoagulant disorder 12/20/2013  . Back pain, chronic 11/07/2013  . Absence of bladder continence 08/05/2013  . History of anticoagulant therapy 07/17/2013  . Epilepsy with intractable epilepsy 02/28/2013  . Absolute anemia 02/28/2013  . Seizures 02/21/2013  . Seizure 02/17/2013  . DVT, bilateral lower limbs 11/07/2012  . Hypothyroid 07/04/2012  . Seizure disorder 07/04/2012  . Chronic pain syndrome 07/04/2012  . S/P BKA (below knee amputation) 07/04/2012  . Chronic pain associated with significant psychosocial dysfunction 07/04/2012  . Cerebrovascular accident, late effects 06/05/2012  . Status post above knee amputation 06/08/2011  . Mechanical complication of graft of bone, cartilage, muscle or tendon 05/10/2011  . Depression 05/03/2011  . 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

## 2014-05-18 DIAGNOSIS — R928 Other abnormal and inconclusive findings on diagnostic imaging of breast: Secondary | ICD-10-CM | POA: Insufficient documentation

## 2014-05-22 ENCOUNTER — Encounter: Payer: Self-pay | Admitting: Physical Therapy

## 2014-05-22 ENCOUNTER — Ambulatory Visit: Payer: Medicare Other | Admitting: Physical Therapy

## 2014-05-22 DIAGNOSIS — R269 Unspecified abnormalities of gait and mobility: Secondary | ICD-10-CM

## 2014-05-22 DIAGNOSIS — R2689 Other abnormalities of gait and mobility: Secondary | ICD-10-CM

## 2014-05-22 DIAGNOSIS — R6889 Other general symptoms and signs: Secondary | ICD-10-CM

## 2014-05-22 DIAGNOSIS — Z89612 Acquired absence of left leg above knee: Secondary | ICD-10-CM

## 2014-05-22 DIAGNOSIS — R29818 Other symptoms and signs involving the nervous system: Secondary | ICD-10-CM | POA: Diagnosis not present

## 2014-05-22 NOTE — Therapy (Signed)
[facility] [address] [place], [place], [address] Phone: [phone]   Fax:  [phone]  Physical Therapy Treatment  Patient Details  Name: Cassandra Andrade MRN: [id number] Date of Birth: [birth year] Referring Provider:  Fran Lowes, MD  Encounter Date: 05/22/2014      PT End of Session - 05/22/14 1315    Visit Number 2   Number of Visits 17   Date for PT Re-Evaluation 07/12/14   PT Start Time 1315   PT Stop Time 1400   PT Time Calculation (min) 45 min   Equipment Utilized During Treatment Gait belt   Activity Tolerance Patient tolerated treatment well   Behavior During Therapy [facility] for tasks assessed/performed      Past Medical History  Diagnosis Date  . Anxiety   . Depression   . Seizures   . CVA (cerebrovascular accident due to intracerebral hemorrhage) 2004  . Arthritis   . MRSA (methicillin resistant Staphylococcus aureus)   . S/P BKA (below knee amputation) unilateral     Right   . Thyroid disease   . Cancer     Osteosarcoma, right leg  . 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 DVT (deep venous thrombosis)     Left leg    Past Surgical History  Procedure Laterality Date  . Fracture surgery      Rt. femur, folllowed by MRSA  . Brain surgery  2004    cerebral aneurysm  . Total knee arthroplasty Right   . Above knee leg amputation Right     Osteosarcoma    There were no vitals filed for this visit.  Visit Diagnosis:  Balance problems  Abnormality of gait  Decreased functional activity tolerance  Status post above knee amputation of left lower extremity      Subjective Assessment - 05/22/14 1322    Symptoms She wore prosthesis ~2 hrs 1-2x/day no issues.   Currently in Pain? No/denies                       [facility] Adult PT Treatment/Exercise - 05/22/14 1315    Transfers   Transfers Sit to Stand;Stand to Sit   Sit to Stand 4: Min assist;With upper  extremity assist;With armrests;From chair/3-in-1  from both chairs with & without armrests to walker   Sit to Stand Details (indicate cue type and reason) PT instructed with demo technique, prosthesis control   Stand to Sit 4: Min guard;With upper extremity assist;With armrests;To chair/3-in-1  walker to chairs with & without armrests   Stand to Sit Details PT instructed with demo technique, prosthesis control   Stand Pivot Transfers 4: Min assist  with rolling walker & prosthesis   Stand Pivot Transfer Details (indicate cue type and reason) verbal cues   Ambulation/Gait   Ambulation/Gait Yes   Ambulation/Gait Assistance 4: Min assist   Ambulation/Gait Assistance Details manual & verbal cues on sequence & initial contact with heel to lock prosthesis   Ambulation Distance (Feet) 175 Feet  175' X 1, 115' X 1   Assistive device Prosthesis;Rolling walker   Gait Pattern Step-through pattern;Decreased step length - right;Decreased stance time - left;Decreased stride length;Decreased hip/knee flexion - left;Decreased weight shift to left;Lateral hip instability;Trunk flexed;Abducted - left;Poor foot clearance - left   Ambulation Surface Level;Indoor   Prosthetics   Prosthetic Care Comments  wear of prosthesis 3 hours 2x/day   Current prosthetic wear tolerance (days/week)  daily since evaluation  Current prosthetic wear tolerance (#hours/day)  1-2 hours 1-2 times per day   Residual limb condition  no open areas   Education Provided Skin check;Residual limb care;Proper wear schedule/adjustment;Other (comment)  proper donning,    Person(s) Educated Patient;Parent(s)   Education Method Explanation;Demonstration   Education Method Verbalized understanding;Returned demonstration;Needs further instruction   Donning Prosthesis Minimal assist   Doffing Prosthesis Minimal assist                PT Education - 05/22/14 1315    Education provided Yes   Education Details see prosthetic care    Person(s) Educated Patient;Parent(s)   Methods Explanation;Demonstration;Tactile cues;Verbal cues   Comprehension Verbalized understanding;Returned demonstration;Verbal cues required;Tactile cues required;Need further instruction          PT Short Term Goals - 05/15/14 1015    PT SHORT TERM GOAL #1   Title tolerates wear of prosthesis >50% of awake hours without any skin issues or discomfort (Target Date: 06/14/2014)   Time 4   Period Weeks   Status New   PT SHORT TERM GOAL #2   Title donnes prosthesis correctly with mother cueing / supervising (Target Date: 06/14/2014)   Time 4   Period Weeks   Status New   PT SHORT TERM GOAL #3   Title stands with rolling walker and prosthesis managing clothes for toileting with supervision. (Target Date: 06/14/2014)   Time 4   Period Weeks   Status New   PT SHORT TERM GOAL #4   Title ambulates 125' with rolling walker & prosthesis with supervision. (Target Date: 06/14/2014)   Time 4   Period Weeks   Status New           PT Long Term Goals - 05/15/14 1015    PT LONG TERM GOAL #1   Title tolerates wear of prosthesis >75% of awake hours without change in skin integrity or tenderness. (Target Date: 07/12/2014)   Time 8   Period Weeks   Status New   PT LONG TERM GOAL #2   Title donnes prosthesis modified independent and mother verbalizes proper prosthetic care. (Target Date: 07/12/2014)   Time 8   Period Weeks   Status New   PT LONG TERM GOAL #3   Title ambulates 250' with rolling walker & prosthesis with supervision. (Target Date: 07/12/2014)   Time 8   Period Weeks   Status New   PT LONG TERM GOAL #4   Title negotiates ramps & curbs with rolling walker and stairs with 2 hands on 1 rail with supervision. (Target Date: 07/12/2014)   Time 8   Period Weeks   Status New   PT LONG TERM GOAL #5   Title standing balance with rolling walker & prosthesis reaching 10" anteriorly, across midline and to floor with supervision. (Target Date:  07/12/2014)   Time 8   Period Weeks   Status New               Plan - 05/22/14 1315    Clinical Impression Statement Patient improved sit to/from stand with instruction & repetition. Her  mother appears to understand how to cue patient the same way as PT to facilitate habit with patient's memory issues.   Pt will benefit from skilled therapeutic intervention in order to improve on the following deficits Abnormal gait;Decreased activity tolerance;Decreased balance;Decreased cognition;Decreased endurance;Decreased knowledge of precautions;Decreased knowledge of use of DME;Decreased mobility;Decreased range of motion   Rehab Potential Good   Clinical Impairments Affecting Rehab Potential cognition  PT Frequency 2x / week   PT Duration 8 weeks   PT Treatment/Interventions ADLs/Self Care Home Management;DME Instruction;Gait training;Stair training;Functional mobility training;Therapeutic activities;Therapeutic exercise;Balance training;Neuromuscular re-education;Patient/family education;Other (comment)  prosthetic training   PT Next Visit Plan review prosthetic care, HEP, gait with RW   PT Home Exercise Plan mid-line at sink   Consulted and Agree with Plan of Care Patient;Family member/caregiver   Family Member Consulted mother        Problem List Patient Active Problem List   Diagnosis Date Noted  . Major depression, recurrent 03/19/2014  . Postprocedural state 12/31/2013  . Lupus anticoagulant disorder 12/20/2013  . Back pain, chronic 11/07/2013  . Absence of bladder continence 08/05/2013  . History of anticoagulant therapy 07/17/2013  . Epilepsy with intractable epilepsy 02/28/2013  . Absolute anemia 02/28/2013  . Seizures 02/21/2013  . Seizure 02/17/2013  . DVT, bilateral lower limbs 11/07/2012  . Hypothyroid 07/04/2012  . Seizure disorder 07/04/2012  . Chronic pain syndrome 07/04/2012  . S/P BKA (below knee amputation) 07/04/2012  . Chronic pain associated with  significant psychosocial dysfunction 07/04/2012  . Cerebrovascular accident, late effects 06/05/2012  . Status post above knee amputation 06/08/2011  . Mechanical complication of graft of bone, cartilage, muscle or tendon 05/10/2011  . Depression 05/03/2011  . 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 [place], [place], [address] Phone: [phone]   Fax:  [phone]

## 2014-05-23 ENCOUNTER — Ambulatory Visit: Payer: Medicare Other | Admitting: Physical Therapy

## 2014-05-23 ENCOUNTER — Encounter: Payer: Self-pay | Admitting: Physical Therapy

## 2014-05-23 DIAGNOSIS — Z89612 Acquired absence of left leg above knee: Secondary | ICD-10-CM

## 2014-05-23 DIAGNOSIS — R6889 Other general symptoms and signs: Secondary | ICD-10-CM

## 2014-05-23 DIAGNOSIS — R29818 Other symptoms and signs involving the nervous system: Secondary | ICD-10-CM | POA: Diagnosis not present

## 2014-05-23 DIAGNOSIS — R269 Unspecified abnormalities of gait and mobility: Secondary | ICD-10-CM

## 2014-05-23 NOTE — Therapy (Signed)
[facility] [address] [place], [place], [address] Phone: [phone]   Fax:  [phone]  Physical Therapy Treatment  Patient Details  Name: Cassandra Andrade MRN: [id number] Date of Birth: [birth year] Referring Provider:  Fran Lowes, MD  Encounter Date: 05/23/2014      PT End of Session - 05/23/14 1015    Visit Number 3   Number of Visits 17   Date for PT Re-Evaluation 07/12/14   PT Start Time 1015   PT Stop Time 1100   PT Time Calculation (min) 45 min   Equipment Utilized During Treatment Gait belt   Activity Tolerance Patient tolerated treatment well   Behavior During Therapy [facility] for tasks assessed/performed      Past Medical History  Diagnosis Date  . Anxiety   . Depression   . Seizures   . CVA (cerebrovascular accident due to intracerebral hemorrhage) 2004  . Arthritis   . MRSA (methicillin resistant Staphylococcus aureus)   . S/P BKA (below knee amputation) unilateral     Right   . Thyroid disease   . Cancer     Osteosarcoma, right leg  . 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 DVT (deep venous thrombosis)     Left leg    Past Surgical History  Procedure Laterality Date  . Fracture surgery      Rt. femur, folllowed by MRSA  . Brain surgery  2004    cerebral aneurysm  . Total knee arthroplasty Right   . Above knee leg amputation Right     Osteosarcoma    There were no vitals filed for this visit.  Visit Diagnosis:  Abnormality of gait  Decreased functional activity tolerance  Status post above knee amputation of left lower extremity      Subjective Assessment - 05/23/14 1020    Symptoms some soreness in arms from yesterday.   Currently in Pain? No/denies                       [facility] Adult PT Treatment/Exercise - 05/23/14 1015    Transfers   Transfers Sit to Stand;Stand to Sit   Sit to Stand 4: Min assist;With upper extremity assist;With  armrests;From chair/3-in-1  from both chairs with & without armrests to walker   Stand to Sit 4: Min guard;With upper extremity assist;With armrests;To chair/3-in-1  walker to chairs with & without armrests   Stand Pivot Transfers --   Ambulation/Gait   Ambulation/Gait Yes   Ambulation/Gait Assistance 4: Min assist   Ambulation/Gait Assistance Details demo, instructed in initial contact with heel to lock prosthesis and wt shift over prosthesis in stance   Ambulation Distance (Feet) 155 Feet  155' X 1, 105' X 1   Assistive device Prosthesis;Rolling walker   Gait Pattern Step-through pattern;Decreased step length - right;Decreased stance time - left;Decreased stride length;Decreased hip/knee flexion - left;Decreased weight shift to left;Lateral hip instability;Trunk flexed;Abducted - left;Poor foot clearance - left   Ambulation Surface Level;Indoor   Prosthetics   Prosthetic Care Comments  toileting with prosthesis   Current prosthetic wear tolerance (days/week)  daily since evaluation   Current prosthetic wear tolerance (#hours/day)  PT instructed 2-3 hours 2 times per day  initiate first wear with dressing after bathing   Residual limb condition  no open areas   Education Provided Proper wear schedule/adjustment  toileting with prosthesis   Person(s) Educated Patient;Parent(s)   Education Method Explanation   Education Method Verbalized  understanding;Needs further instruction                PT Education - 05/23/14 1015    Education provided Yes   Education Details toileting with prosthesis and wear of prosthesis   Person(s) Educated Patient;Parent(s)   Methods Explanation;Demonstration   Comprehension Verbalized understanding          PT Short Term Goals - 05/15/14 1015    PT SHORT TERM GOAL #1   Title tolerates wear of prosthesis >50% of awake hours without any skin issues or discomfort (Target Date: 06/14/2014)   Time 4   Period Weeks   Status New   PT SHORT TERM  GOAL #2   Title donnes prosthesis correctly with mother cueing / supervising (Target Date: 06/14/2014)   Time 4   Period Weeks   Status New   PT SHORT TERM GOAL #3   Title stands with rolling walker and prosthesis managing clothes for toileting with supervision. (Target Date: 06/14/2014)   Time 4   Period Weeks   Status New   PT SHORT TERM GOAL #4   Title ambulates 125' with rolling walker & prosthesis with supervision. (Target Date: 06/14/2014)   Time 4   Period Weeks   Status New           PT Long Term Goals - 05/15/14 1015    PT LONG TERM GOAL #1   Title tolerates wear of prosthesis >75% of awake hours without change in skin integrity or tenderness. (Target Date: 07/12/2014)   Time 8   Period Weeks   Status New   PT LONG TERM GOAL #2   Title donnes prosthesis modified independent and mother verbalizes proper prosthetic care. (Target Date: 07/12/2014)   Time 8   Period Weeks   Status New   PT LONG TERM GOAL #3   Title ambulates 250' with rolling walker & prosthesis with supervision. (Target Date: 07/12/2014)   Time 8   Period Weeks   Status New   PT LONG TERM GOAL #4   Title negotiates ramps & curbs with rolling walker and stairs with 2 hands on 1 rail with supervision. (Target Date: 07/12/2014)   Time 8   Period Weeks   Status New   PT LONG TERM GOAL #5   Title standing balance with rolling walker & prosthesis reaching 10" anteriorly, across midline and to floor with supervision. (Target Date: 07/12/2014)   Time 8   Period Weeks   Status New               Plan - 05/23/14 1015    Clinical Impression Statement Patient was able to improve fluency of gait after ~20-30' of manual  & verbal cues on initial contact and wt shift.   Pt will benefit from skilled therapeutic intervention in order to improve on the following deficits Abnormal gait;Decreased activity tolerance;Decreased balance;Decreased cognition;Decreased endurance;Decreased knowledge of  precautions;Decreased knowledge of use of DME;Decreased mobility;Decreased range of motion   Rehab Potential Good   Clinical Impairments Affecting Rehab Potential cognition   PT Frequency 2x / week   PT Duration 8 weeks   PT Treatment/Interventions ADLs/Self Care Home Management;DME Instruction;Gait training;Stair training;Functional mobility training;Therapeutic activities;Therapeutic exercise;Balance training;Neuromuscular re-education;Patient/family education;Other (comment)  prosthetic training   PT Next Visit Plan review prosthetic care, HEP, gait with RW   PT Home Exercise Plan mid-line at sink   Consulted and Agree with Plan of Care Patient;Family member/caregiver   Family Member Consulted mother  Problem List Patient Active Problem List   Diagnosis Date Noted  . Major depression, recurrent 03/19/2014  . Postprocedural state 12/31/2013  . Lupus anticoagulant disorder 12/20/2013  . Back pain, chronic 11/07/2013  . Absence of bladder continence 08/05/2013  . History of anticoagulant therapy 07/17/2013  . Epilepsy with intractable epilepsy 02/28/2013  . Absolute anemia 02/28/2013  . Seizures 02/21/2013  . Seizure 02/17/2013  . DVT, bilateral lower limbs 11/07/2012  . Hypothyroid 07/04/2012  . Seizure disorder 07/04/2012  . Chronic pain syndrome 07/04/2012  . S/P BKA (below knee amputation) 07/04/2012  . Chronic pain associated with significant psychosocial dysfunction 07/04/2012  . Cerebrovascular accident, late effects 06/05/2012  . Status post above knee amputation 06/08/2011  . Mechanical complication of graft of bone, cartilage, muscle or tendon 05/10/2011  . Depression 05/03/2011  . 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 [place], [place], [address] Phone: [phone]   Fax:  [phone]

## 2014-05-27 ENCOUNTER — Ambulatory Visit: Payer: Medicare Other | Attending: Family | Admitting: Physical Therapy

## 2014-05-27 ENCOUNTER — Encounter: Payer: Self-pay | Admitting: Physical Therapy

## 2014-05-27 DIAGNOSIS — R531 Weakness: Secondary | ICD-10-CM | POA: Insufficient documentation

## 2014-05-27 DIAGNOSIS — R269 Unspecified abnormalities of gait and mobility: Secondary | ICD-10-CM | POA: Diagnosis not present

## 2014-05-27 DIAGNOSIS — R2689 Other abnormalities of gait and mobility: Secondary | ICD-10-CM

## 2014-05-27 DIAGNOSIS — Z89612 Acquired absence of left leg above knee: Secondary | ICD-10-CM | POA: Insufficient documentation

## 2014-05-27 DIAGNOSIS — M24652 Ankylosis, left hip: Secondary | ICD-10-CM | POA: Diagnosis not present

## 2014-05-27 DIAGNOSIS — R29818 Other symptoms and signs involving the nervous system: Secondary | ICD-10-CM | POA: Insufficient documentation

## 2014-05-27 DIAGNOSIS — R6889 Other general symptoms and signs: Secondary | ICD-10-CM

## 2014-05-27 NOTE — Therapy (Signed)
[facility] [address] [place], [place], [address] Phone: [phone]   Fax:  [phone]  Physical Therapy Treatment  Patient Details  Name: Cassandra Andrade MRN: [id number] Date of Birth: 01/23/1966 Referring Provider:  Fran Lowes, MD  Encounter Date: 05/27/2014      PT End of Session - 05/27/14 1100    Visit Number 4   Number of Visits 17   Date for PT Re-Evaluation 07/12/14   PT Start Time 1100   PT Stop Time 1146   PT Time Calculation (min) 46 min   Equipment Utilized During Treatment Gait belt   Activity Tolerance Patient tolerated treatment well   Behavior During Therapy [facility] for tasks assessed/performed      Past Medical History  Diagnosis Date  . Anxiety   . Depression   . Seizures   . CVA (cerebrovascular accident due to intracerebral hemorrhage) 2004  . Arthritis   . MRSA (methicillin resistant Staphylococcus aureus)   . S/P BKA (below knee amputation) unilateral     Right   . Thyroid disease   . Cancer     Osteosarcoma, right leg  . 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 DVT (deep venous thrombosis)     Left leg    Past Surgical History  Procedure Laterality Date  . Fracture surgery      Rt. femur, folllowed by MRSA  . Brain surgery  2004    cerebral aneurysm  . Total knee arthroplasty Right   . Above knee leg amputation Right     Osteosarcoma    There were no vitals filed for this visit.  Visit Diagnosis:  Abnormality of gait  Decreased functional activity tolerance  Status post above knee amputation of left lower extremity  Balance problems      Subjective Assessment - 05/27/14 1108    Subjective Wearing prosthesis 3-4 hours with no skin issues.    Currently in Pain? No/denies     Patient and mother reports she got prosthesis stuck between toilet and cabinet / sink this morning. PT discussed with patient & mother the layout of bathroom to determine  how prosthesis was caught. They are going to take pictures for PT to assess. Preliminarily it appears to be how she is turning.                  [facility] Adult PT Treatment/Exercise - 05/27/14 1100    Transfers   Transfers Sit to Stand;Stand to Sit   Sit to Stand With upper extremity assist;With armrests;From chair/3-in-1;4: Min guard   Sit to Stand Details (indicate cue type and reason) PT cueing technique to lock prosthesis   Stand to Sit 4: Min guard;With upper extremity assist;With armrests;To chair/3-in-1  walker to chairs with & without armrests   Stand to Sit Details PT cueing technique   Ambulation/Gait   Ambulation/Gait Yes   Ambulation/Gait Assistance 4: Min assist   Ambulation/Gait Assistance Details manual & verbal cues on prosthesis control locking knee with initial contact and wt shift in stance   Ambulation Distance (Feet) 155 Feet  155' X 1   Assistive device Prosthesis;Rolling walker   Gait Pattern --   Ambulation Surface Level;Indoor   Stairs Yes   Stairs Assistance 4: Min assist   Stairs Assistance Details (indicate cue type and reason) verbal & manual cues on sequence & knee control   Stair Management Technique Two rails;Step to pattern;Forwards   Number of Stairs 4  Prosthetics   Prosthetic Care Comments  --   Current prosthetic wear tolerance (days/week)  daily since evaluation   Current prosthetic wear tolerance (#hours/day)  PT instructed 3 hours 2 times per day  initiate first wear with dressing after bathing   Residual limb condition  no open areas   Education Provided Other (comment)  proper donning including liner   Person(s) Educated Patient;Parent(s)   Education Method Explanation;Demonstration;Tactile cues;Verbal cues   Education Method Verbalized understanding;Tactile cues required;Verbal cues required;Needs further instruction   Donning Prosthesis Minimal assist   Doffing Prosthesis Minimal assist                PT Education  - 05/27/14 1100    Education provided Yes   Education Details donning liner with no air, donning prosthesis with proper rotation   Person(s) Educated Patient;Parent(s)   Methods Explanation;Demonstration;Tactile cues;Verbal cues   Comprehension Verbalized understanding;Returned demonstration;Verbal cues required;Tactile cues required;Need further instruction          PT Short Term Goals - 05/15/14 1015    PT SHORT TERM GOAL #1   Title tolerates wear of prosthesis >50% of awake hours without any skin issues or discomfort (Target Date: 06/14/2014)   Time 4   Period Weeks   Status New   PT SHORT TERM GOAL #2   Title donnes prosthesis correctly with mother cueing / supervising (Target Date: 06/14/2014)   Time 4   Period Weeks   Status New   PT SHORT TERM GOAL #3   Title stands with rolling walker and prosthesis managing clothes for toileting with supervision. (Target Date: 06/14/2014)   Time 4   Period Weeks   Status New   PT SHORT TERM GOAL #4   Title ambulates 125' with rolling walker & prosthesis with supervision. (Target Date: 06/14/2014)   Time 4   Period Weeks   Status New           PT Long Term Goals - 05/15/14 1015    PT LONG TERM GOAL #1   Title tolerates wear of prosthesis >75% of awake hours without change in skin integrity or tenderness. (Target Date: 07/12/2014)   Time 8   Period Weeks   Status New   PT LONG TERM GOAL #2   Title donnes prosthesis modified independent and mother verbalizes proper prosthetic care. (Target Date: 07/12/2014)   Time 8   Period Weeks   Status New   PT LONG TERM GOAL #3   Title ambulates 250' with rolling walker & prosthesis with supervision. (Target Date: 07/12/2014)   Time 8   Period Weeks   Status New   PT LONG TERM GOAL #4   Title negotiates ramps & curbs with rolling walker and stairs with 2 hands on 1 rail with supervision. (Target Date: 07/12/2014)   Time 8   Period Weeks   Status New   PT LONG TERM GOAL #5   Title  standing balance with rolling walker & prosthesis reaching 10" anteriorly, across midline and to floor with supervision. (Target Date: 07/12/2014)   Time 8   Period Weeks   Status New               Plan - 05/27/14 1212    Clinical Impression Statement patient and mother appear to have better understanding of proper donning. Patient required less manual cues to ambulate with proshtesis and walker.   Pt will benefit from skilled therapeutic intervention in order to improve on the following deficits Abnormal gait;Decreased  activity tolerance;Decreased balance;Decreased cognition;Decreased endurance;Decreased knowledge of precautions;Decreased knowledge of use of DME;Decreased mobility;Decreased range of motion   Rehab Potential Good   Clinical Impairments Affecting Rehab Potential cognition   PT Frequency 2x / week   PT Duration 8 weeks   PT Treatment/Interventions ADLs/Self Care Home Management;DME Instruction;Gait training;Stair training;Functional mobility training;Therapeutic activities;Therapeutic exercise;Balance training;Neuromuscular re-education;Patient/family education;Other (comment)  prosthetic training   PT Next Visit Plan review prosthetic care, HEP, gait with RW   PT Home Exercise Plan mid-line at sink   Consulted and Agree with Plan of Care Patient;Family member/caregiver   Family Member Consulted mother        Problem List Patient Active Problem List   Diagnosis Date Noted  . Major depression, recurrent 03/19/2014  . Postprocedural state 12/31/2013  . Lupus anticoagulant disorder 12/20/2013  . Back pain, chronic 11/07/2013  . Absence of bladder continence 08/05/2013  . History of anticoagulant therapy 07/17/2013  . Epilepsy with intractable epilepsy 02/28/2013  . Absolute anemia 02/28/2013  . Seizures 02/21/2013  . Seizure 02/17/2013  . DVT, bilateral lower limbs 11/07/2012  . Hypothyroid 07/04/2012  . Seizure disorder 07/04/2012  . Chronic pain syndrome  07/04/2012  . S/P BKA (below knee amputation) 07/04/2012  . Chronic pain associated with significant psychosocial dysfunction 07/04/2012  . Cerebrovascular accident, late effects 06/05/2012  . Status post above knee amputation 06/08/2011  . Mechanical complication of graft of bone, cartilage, muscle or tendon 05/10/2011  . Depression 05/03/2011  . 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 [place], [place], [address] Phone: [phone]   Fax:  [phone]

## 2014-05-28 ENCOUNTER — Ambulatory Visit (INDEPENDENT_AMBULATORY_CARE_PROVIDER_SITE_OTHER): Payer: Medicare Other | Admitting: Psychology

## 2014-05-28 DIAGNOSIS — F330 Major depressive disorder, recurrent, mild: Secondary | ICD-10-CM

## 2014-05-28 NOTE — Progress Notes (Signed)
   THERAPIST PROGRESS NOTE  Session Time: 2.40pm-3.30pm  Participation Level: Active  Behavioral Response: Well GroomedAlert, AFFECT WNL  Type of Therapy: Individual Therapy  Treatment Goals addressed: Diagnosis: MDD and goal 1.  Interventions: CBT, Strength-based and Supportive  Summary: Meira Wahba is a 49 y.o. female who presents with affect WNL.  Pt reported that overall her mood has been good.  Pt reports she has felt impatient w/ time takes to learn to walk w/ her prosthetic leg.  Pt reports she is practicing acceptance that will take time and practice and still focused that she will be persistent to see through.  Pt at times comparing to sister and putting self down in process. Pt was able to recognize and identify her positives and reframe towards positive self talk.   Suicidal/Homicidal: Nowithout intent/plan  Therapist Response: Assessed pt current functioning per pt report. Processed w/pt her mood and assisted in acknowledging positive thinking patterns and negative thinking patterns.  Assisted pt in reframing and acknowledging her strengths and progress made.   Plan: Return again in 4 weeks.  Diagnosis: MDD    Jan Fireman, [facility] 05/28/2014

## 2014-05-29 ENCOUNTER — Encounter: Payer: Self-pay | Admitting: Physical Therapy

## 2014-05-29 ENCOUNTER — Ambulatory Visit: Payer: Medicare Other | Admitting: Physical Therapy

## 2014-05-29 DIAGNOSIS — R2689 Other abnormalities of gait and mobility: Secondary | ICD-10-CM

## 2014-05-29 DIAGNOSIS — R29818 Other symptoms and signs involving the nervous system: Secondary | ICD-10-CM | POA: Diagnosis not present

## 2014-05-29 DIAGNOSIS — R6889 Other general symptoms and signs: Secondary | ICD-10-CM

## 2014-05-29 DIAGNOSIS — Z89612 Acquired absence of left leg above knee: Secondary | ICD-10-CM

## 2014-05-29 DIAGNOSIS — R269 Unspecified abnormalities of gait and mobility: Secondary | ICD-10-CM

## 2014-05-29 NOTE — Therapy (Signed)
[facility] [address] [place], [place], [address] Phone: [phone]   Fax:  [phone]  Physical Therapy Treatment  Patient Details  Name: Cassandra Andrade MRN: [id number] Date of Birth: 1965-11-09 Referring Provider:  Fran Lowes, MD  Encounter Date: 05/29/2014    Past Medical History  Diagnosis Date  . Anxiety   . Depression   . Seizures   . CVA (cerebrovascular accident due to intracerebral hemorrhage) 2004  . Arthritis   . MRSA (methicillin resistant Staphylococcus aureus)   . S/P BKA (below knee amputation) unilateral     Right   . Thyroid disease   . Cancer     Osteosarcoma, right leg  . 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 DVT (deep venous thrombosis)     Left leg    Past Surgical History  Procedure Laterality Date  . Fracture surgery      Rt. femur, folllowed by MRSA  . Brain surgery  2004    cerebral aneurysm  . Total knee arthroplasty Right   . Above knee leg amputation Right     Osteosarcoma    There were no vitals filed for this visit.  Visit Diagnosis:  Abnormality of gait  Decreased functional activity tolerance  Status post above knee amputation of left lower extremity  Balance problems      Subjective Assessment - 05/29/14 1322    Subjective Had seizure in waiting room and used watch to stop it. Wearing prosthesis 2 hrs 2x/day.    Currently in Pain? No/denies                       [facility] Adult PT Treatment/Exercise - 05/29/14 1315    Transfers   Transfers Sit to Stand;Stand to Sit   Sit to Stand With upper extremity assist;With armrests;From chair/3-in-1;4: Min guard   Sit to Stand Details (indicate cue type and reason) PT cueing knee control   Stand to Sit 4: Min guard;With upper extremity assist;With armrests;To chair/3-in-1  walker to chairs with & without armrests   Stand to Sit Details PT cueing positioning   Ambulation/Gait   Ambulation/Gait Yes   Ambulation/Gait Assistance 4: Min assist   Ambulation/Gait Assistance Details demo, manual & verbal cues on knee locking at initial contact, wt shift over prosthesis in stance, decrease pelvic retraction   Ambulation Distance (Feet) 155 Feet  155' X 2   Assistive device Prosthesis;Rolling walker   Gait Pattern Step-to pattern;Decreased stance time - right;Decreased step length - left;Trunk rotated posteriorly on right   Ambulation Surface Level;Indoor   Stairs --   Stairs Assistance --   Stair Management Technique --   Number of Stairs --   Prosthetics   Current prosthetic wear tolerance (days/week)  daily since evaluation   Current prosthetic wear tolerance (#hours/day)  PT instructed 3 hours 2 times per day  initiate first wear with dressing after bathing   Residual limb condition  no open areas   Education Provided Proper wear schedule/adjustment   Person(s) Educated Patient;Parent(s)   Education Method Explanation   Education Method Verbalized understanding;Needs further instruction   Donning Prosthesis Minimal assist   Doffing Prosthesis Minimal assist                PT Education - 05/29/14 1422    Education provided Yes   Education Details bathroom transfer using RW to walk into bathroom with mother assist.   Person(s) Educated Patient;Parent(s)   Methods  Explanation;Demonstration   Comprehension Verbalized understanding;Need further instruction          PT Short Term Goals - 05/15/14 1015    PT SHORT TERM GOAL #1   Title tolerates wear of prosthesis >50% of awake hours without any skin issues or discomfort (Target Date: 06/14/2014)   Time 4   Period Weeks   Status New   PT SHORT TERM GOAL #2   Title donnes prosthesis correctly with mother cueing / supervising (Target Date: 06/14/2014)   Time 4   Period Weeks   Status New   PT SHORT TERM GOAL #3   Title stands with rolling walker and prosthesis managing clothes for toileting with  supervision. (Target Date: 06/14/2014)   Time 4   Period Weeks   Status New   PT SHORT TERM GOAL #4   Title ambulates 125' with rolling walker & prosthesis with supervision. (Target Date: 06/14/2014)   Time 4   Period Weeks   Status New           PT Long Term Goals - 05/15/14 1015    PT LONG TERM GOAL #1   Title tolerates wear of prosthesis >75% of awake hours without change in skin integrity or tenderness. (Target Date: 07/12/2014)   Time 8   Period Weeks   Status New   PT LONG TERM GOAL #2   Title donnes prosthesis modified independent and mother verbalizes proper prosthetic care. (Target Date: 07/12/2014)   Time 8   Period Weeks   Status New   PT LONG TERM GOAL #3   Title ambulates 250' with rolling walker & prosthesis with supervision. (Target Date: 07/12/2014)   Time 8   Period Weeks   Status New   PT LONG TERM GOAL #4   Title negotiates ramps & curbs with rolling walker and stairs with 2 hands on 1 rail with supervision. (Target Date: 07/12/2014)   Time 8   Period Weeks   Status New   PT LONG TERM GOAL #5   Title standing balance with rolling walker & prosthesis reaching 10" anteriorly, across midline and to floor with supervision. (Target Date: 07/12/2014)   Time 8   Period Weeks   Status New               Plan - 05/29/14 1423    Clinical Impression Statement Patient was slower to respond to cues due to seizure just prior to PT today so less activity tolerance. She was able to tolerate modified entire session.   Pt will benefit from skilled therapeutic intervention in order to improve on the following deficits Abnormal gait;Decreased activity tolerance;Decreased balance;Decreased cognition;Decreased endurance;Decreased knowledge of precautions;Decreased knowledge of use of DME;Decreased mobility;Decreased range of motion   Rehab Potential Good   Clinical Impairments Affecting Rehab Potential cognition   PT Frequency 2x / week   PT Duration 8 weeks   PT  Treatment/Interventions ADLs/Self Care Home Management;DME Instruction;Gait training;Stair training;Functional mobility training;Therapeutic activities;Therapeutic exercise;Balance training;Neuromuscular re-education;Patient/family education;Other (comment)  prosthetic training   PT Next Visit Plan review prosthetic care, HEP, gait with RW   PT Home Exercise Plan mid-line at sink   Consulted and Agree with Plan of Care Patient;Family member/caregiver   Family Member Consulted mother        Problem List Patient Active Problem List   Diagnosis Date Noted  . Major depression, recurrent 03/19/2014  . Postprocedural state 12/31/2013  . Lupus anticoagulant disorder 12/20/2013  . Back pain, chronic 11/07/2013  . Absence of  bladder continence 08/05/2013  . History of anticoagulant therapy 07/17/2013  . Epilepsy with intractable epilepsy 02/28/2013  . Absolute anemia 02/28/2013  . Seizures 02/21/2013  . Seizure 02/17/2013  . DVT, bilateral lower limbs 11/07/2012  . Hypothyroid 07/04/2012  . Seizure disorder 07/04/2012  . Chronic pain syndrome 07/04/2012  . S/P BKA (below knee amputation) 07/04/2012  . Chronic pain associated with significant psychosocial dysfunction 07/04/2012  . Cerebrovascular accident, late effects 06/05/2012  . Status post above knee amputation 06/08/2011  . Mechanical complication of graft of bone, cartilage, muscle or tendon 05/10/2011  . Depression 05/03/2011  . 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 [place], [place], [address] Phone: [phone]   Fax:  [phone]

## 2014-06-05 ENCOUNTER — Encounter (HOSPITAL_COMMUNITY): Payer: Self-pay | Admitting: Psychiatry

## 2014-06-05 ENCOUNTER — Ambulatory Visit: Payer: Medicare Other | Admitting: Physical Therapy

## 2014-06-05 ENCOUNTER — Ambulatory Visit (INDEPENDENT_AMBULATORY_CARE_PROVIDER_SITE_OTHER): Payer: Medicare Other | Admitting: Psychiatry

## 2014-06-05 VITALS — BP 106/67 | HR 86 | Ht 67.0 in | Wt 185.0 lb

## 2014-06-05 DIAGNOSIS — F321 Major depressive disorder, single episode, moderate: Secondary | ICD-10-CM | POA: Diagnosis not present

## 2014-06-05 DIAGNOSIS — R45851 Suicidal ideations: Secondary | ICD-10-CM | POA: Diagnosis not present

## 2014-06-05 MED ORDER — FLUOXETINE HCL 20 MG PO CAPS: 20.0000 mg | ORAL_CAPSULE | Freq: Two times a day (BID) | ORAL | Status: DC

## 2014-06-05 NOTE — Progress Notes (Signed)
Cassandra Andrade Note  Cassandra Andrade [id number] 49 y.o.  06/05/2014 3:37 PM  Chief Complaint:  I am feeling much better.  I'm ready to cut down Prozac.       History of Present Illness: Cassandra Andrade came for her followup appointment.  She is doing much better on Prozac 60 mg but she is ready to cut down the dosage.  She denies any irritability, anger, mood swing.  She is using prosthesis on a regular basis.  Her children recently came to visit her from [place] and she was very happy about it.  Her divorces finalizing 5 months ago and she do not have any regret or any crying spells.  Her mother is very supportive.  She sleeping good.  She denies any feeling of hopelessness or worthlessness.  Her seizures are less frequent and less intense from the past.  She's only have one to 2 seizures a week.  Her energy level is good.  She is hoping to start school and she wants to study logistic and looking for a school who offers the subject.  Patient denies drinking or using any illegal substances.  Her appetite is okay.  Her vitals are stable.  Patient denies drinking or using any illegal substances.  She lives with her mother who is very supportive.  Patient is seeing therapist Cassandra Andrade for counseling.  Suicidal Ideation: Yes Plan Formed: No Patient has means to carry out plan: No  Homicidal Ideation: No Plan Formed: No Patient has means to carry out plan: No  Review of Systems  Constitutional: Negative.   HENT: Negative.   Musculoskeletal:       Below-knee amputation and right leg  Skin: Negative.   Neurological: Positive for tingling.  Psychiatric/Behavioral: Negative.     Psychiatric: Agitation: No Hallucination: No Depressed Mood: No Insomnia: No Hypersomnia: No Altered Concentration: No Feels Worthless: No Grandiose Ideas: No Belief In Special Powers: No New/Increased Substance Abuse: No Compulsions: No  Neurologic: Headache: Yes Seizure:  Yes Paresthesias: Yes  Past Medical Family, Social History:  Patient has a history of cerebrovascular accident due to brain hemorrhage.  She has history of MRSA resulted right knee amputation.  She has hypothyroidism, chronic neuropathy pain , headache and seizure disorder.  She see Dr. [name] for seizures.  Her primary care is [name] at [facility].    Outpatient Encounter Prescriptions as of 06/05/2014  Medication Sig  . Ascorbic Acid (VITAMIN C) 1000 MG tablet Take 1,000 mg by mouth daily.   . Calcium Carbonate-Vitamin D 600-400 MG-UNIT per tablet Take 1 tablet by mouth daily.   [name] DIAZEPAM RE Place 20 mg rectally. Rectally as needed for seizure lasting longer than 2 minutes  . ferrous sulfate 325 (65 FE) MG tablet Take 325 mg by mouth daily with breakfast.  . FLUoxetine (PROZAC) 20 MG capsule Take 1 capsule (20 mg total) by mouth 2 (two) times daily.  [name] lacosamide (VIMPAT) 200 MG TABS tablet Take 1 tablet (200 mg total) by mouth 2 (two) times daily.  [name] lamoTRIgine (LAMICTAL) 200 MG tablet Take 2 tablets (400 mg total) by mouth 2 (two) times daily.  [name] levETIRAcetam (KEPPRA) 1000 MG tablet Take 2 tablets (2,000 mg total) by mouth 2 (two) times daily.  [name] levothyroxine (SYNTHROID, LEVOTHROID) 50 MCG tablet Take 50 mcg by mouth daily before breakfast.  . promethazine (PHENERGAN) 25 MG tablet Take 25 mg by mouth every 6 (six) hours as needed. nausea  . warfarin (  COUMADIN) 5 MG tablet Take 5 mg by mouth 2 (two) times daily.  . [DISCONTINUED] FLUoxetine (PROZAC) 20 MG capsule Take 1 capsule (20 mg total) by mouth See admin instructions. Take 1 capsule in the morning and 2 capsules in the evening.   Past Psychiatric History/Hospitalization(s): Patient has at least one psychiatric admission in 2011 due to overdose on her pain medication.  At that time she has a marital conflict and having argument with her husband.  She denies any history of mania psychosis hallucination.   In the past she has given Zoloft and Remeron however she do not remember the details.  She remembered taking Lexapro and Abilify but it was stopped because lack of response.   Anxiety: Yes Bipolar Disorder: No Depression: Yes Mania: No Psychosis: No Schizophrenia: No Personality Disorder: No Hospitalization for psychiatric illness: Yes History of Electroconvulsive Shock Therapy: No Prior Suicide Attempts: Yes  Physical Exam: Constitutional:  BP 106/67 mmHg  Pulse 86  Ht 5\' 7"  (1.702 m)  Wt 185 lb (83.915 kg)  BMI 28.97 kg/m2  General Appearance: alert, oriented, no acute distress and well nourished  Recent Results (from the past 2160 hour(s))  CBC with Differential     Status: Abnormal   Collection Time: 04/04/14  2:05 PM  Result Value Ref Range   WBC 4.0 3.9 - 10.3 10e3/uL   NEUT# 2.1 1.5 - 6.5 10e3/uL   HGB 11.8 11.6 - 15.9 g/dL   HCT 38.3 34.8 - 46.6 %   Platelets 218 145 - 400 10e3/uL   MCV 90.6 79.5 - 101.0 fL   MCH 27.9 25.1 - 34.0 pg   MCHC 30.8 (L) 31.5 - 36.0 g/dL   RBC 4.23 3.70 - 5.45 10e6/uL   RDW 13.9 11.2 - 14.5 %   lymph# 1.5 0.9 - 3.3 10e3/uL   MONO# 0.4 0.1 - 0.9 10e3/uL   Eosinophils Absolute 0.1 0.0 - 0.5 10e3/uL   Basophils Absolute 0.0 0.0 - 0.1 10e3/uL   NEUT% 51.5 38.4 - 76.8 %   LYMPH% 36.9 14.0 - 49.7 %   MONO% 9.3 0.0 - 14.0 %   EOS% 1.6 0.0 - 7.0 %   BASO% 0.7 0.0 - 2.0 %  Protime-INR     Status: Abnormal   Collection Time: 04/04/14  2:05 PM  Result Value Ref Range   Protime 32.4 (H) 10.6 - 13.4 Seconds   INR 2.70 2.00 - 3.50    Comment: INR is useful only to assess adequacy of anticoagulation with coumadin when comparing results from different labs. It should not be used to estimate bleeding risk or presence/abscense of coagulopathy in patients not on coumadin. Expected INR ranges for  nontherapeutic patients is 0.88 - 1.12.    Lovenox No     Musculoskeletal: Strength & Muscle Tone: decreased Gait & Station: Patient is  wheelchair-bound.  She has the right below-knee amputation. Patient leans: See above  Psychiatric: Speech (describe rate, volume, coherence, spontaneity, and abnormalities if any): Soft with decreased volume and tone.    Thought Process (describe rate, content, abstract reasoning, and computation): Logical and goal-directed.    Associations: Intact, no delusions or any paranoia.  Her fund of knowledge is average.    Thoughts: normal and No active or passive suicidal thoughts or homicidal thoughts.  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  She is doing much better on medication.  Recommended to reduce Prozac 40 mg .  Discussed medication side effects and benefits.  Her psychosocial stressors are also less intense and she is seeing therapist on a regular basis.  She feels since her divorce is finalized she is more calm.  She is taking Lamictal , vimpat and Keppra from her neurologist.  Recommended to call us back if she has any question, concern or if she feels worsening of the symptoms.  I will see her again in 3 months. Time spent 25 minutes.  More than 50% of the time spent in psychoeducation, counseling and coordination of care.  Discuss safety plan that anytime having active suicidal thoughts or homicidal thoughts then patient  need to call 911 or go to the local emergency room.    Antoinette Borgwardt T., MD 06/05/2014

## 2014-06-07 ENCOUNTER — Ambulatory Visit: Payer: Medicare Other | Admitting: Rehabilitative and Restorative Service Providers"

## 2014-06-07 DIAGNOSIS — R531 Weakness: Secondary | ICD-10-CM

## 2014-06-07 DIAGNOSIS — R29818 Other symptoms and signs involving the nervous system: Secondary | ICD-10-CM | POA: Diagnosis not present

## 2014-06-07 DIAGNOSIS — R269 Unspecified abnormalities of gait and mobility: Secondary | ICD-10-CM

## 2014-06-07 NOTE — Patient Instructions (Signed)
DO AT COUNTERTOP WITH FAMILY GUARDING YOU FOR SAFETY:  Weight Shift: Anterior / Posterior (Limits of Stability)   Slowly shift weight backward and forward, controlling right knee.  Repeat _5___ times per session. Do _1___ sessions per day.  Copyright  VHI. All rights reserved.  FUNCTIONAL MOBILITY: Weight Shift  Stance: shoulder-width on floor. Stand with upright posture shift weight from side to side. 5 reps per set, 1 time/day.   Copyright  VHI. All rights reserved.  Side-Stepping   Sidestep to the right and then left holding onto countertop. Copyright  VHI. All rights reserved.

## 2014-06-07 NOTE — Therapy (Signed)
[facility] [address] [place], [place], [address] Phone: [phone]   Fax:  [phone]  Physical Therapy Treatment  Patient Details  Name: Cassandra Andrade MRN: [id number] Date of Birth: [birth year] Referring Provider:  Fran Lowes, MD  Encounter Date: 06/07/2014      PT End of Session - 06/07/14 1826    Visit Number 5   Number of Visits 17   Date for PT Re-Evaluation 07/12/14   PT Start Time 1455   PT Stop Time 1535   PT Time Calculation (min) 40 min   Equipment Utilized During Treatment Gait belt   Activity Tolerance Patient tolerated treatment well   Behavior During Therapy [facility] for tasks assessed/performed      Past Medical History  Diagnosis Date  . Anxiety   . Depression   . Seizures   . CVA (cerebrovascular accident due to intracerebral hemorrhage) 2004  . Arthritis   . MRSA (methicillin resistant Staphylococcus aureus)   . S/P BKA (below knee amputation) unilateral     Right   . Thyroid disease   . Cancer     Osteosarcoma, right leg  . 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 DVT (deep venous thrombosis)     Left leg    Past Surgical History  Procedure Laterality Date  . Fracture surgery      Rt. femur, folllowed by MRSA  . Brain surgery  2004    cerebral aneurysm  . Total knee arthroplasty Right   . Above knee leg amputation Right     Osteosarcoma    There were no vitals filed for this visit.  Visit Diagnosis:  Abnormality of gait  Generalized weakness      Subjective Assessment - 06/07/14 1458    Subjective The patient tried walking into the bathroom with home aide.  She reports she would want more exercise for balance.   Currently in Pain? No/denies     PROSTHETIC TRAINING: Sit<>stand with cues on R LE placement and min A decreasing  To CGA t/o our session Gait activities x 40 ft with patient experiencing a seizure per report with general shakiness--she  used seizure device (on wrist and implanted) to stop seizure Rested x 2 minutes, then ambulated 345 feet nonstop with RW and CGA for safety with cues on R hip initiation and L longer stride length STanding weight shifting activities at the sink for anterior/posterior weight shifting and lateral weight shifting Sidestepping along countertop x 10 feet x each direction Stair negotiation with CGA x 4 steps with step to pattern and bilateral handrail support.                          PT Education - 06/07/14 1825    Education provided Yes   Education Details HEP: sidestepping, anterior/posterior weight shifting at counter, lateral weight shifting at counter   Person(s) Educated Patient;Parent(s)   Methods Demonstration;Explanation;Handout   Comprehension Verbalized understanding;Returned demonstration          PT Short Term Goals - 05/15/14 1015    PT SHORT TERM GOAL #1   Title tolerates wear of prosthesis >50% of awake hours without any skin issues or discomfort (Target Date: 06/14/2014)   Time 4   Period Weeks   Status New   PT SHORT TERM GOAL #2   Title donnes prosthesis correctly with mother cueing / supervising (Target Date: 06/14/2014)   Time 4   Period [place]  Status New   PT SHORT TERM GOAL #3   Title stands with rolling walker and prosthesis managing clothes for toileting with supervision. (Target Date: 06/14/2014)   Time 4   Period Weeks   Status New   PT SHORT TERM GOAL #4   Title ambulates 125' with rolling walker & prosthesis with supervision. (Target Date: 06/14/2014)   Time 4   Period Weeks   Status New           PT Long Term Goals - 05/15/14 1015    PT LONG TERM GOAL #1   Title tolerates wear of prosthesis >75% of awake hours without change in skin integrity or tenderness. (Target Date: 07/12/2014)   Time 8   Period Weeks   Status New   PT LONG TERM GOAL #2   Title donnes prosthesis modified independent and mother verbalizes proper  prosthetic care. (Target Date: 07/12/2014)   Time 8   Period Weeks   Status New   PT LONG TERM GOAL #3   Title ambulates 250' with rolling walker & prosthesis with supervision. (Target Date: 07/12/2014)   Time 8   Period Weeks   Status New   PT LONG TERM GOAL #4   Title negotiates ramps & curbs with rolling walker and stairs with 2 hands on 1 rail with supervision. (Target Date: 07/12/2014)   Time 8   Period Weeks   Status New   PT LONG TERM GOAL #5   Title standing balance with rolling walker & prosthesis reaching 10" anteriorly, across midline and to floor with supervision. (Target Date: 07/12/2014)   Time 8   Period Weeks   Status New               Plan - 06/07/14 1826    Clinical Impression Statement The patient demonstrated considerable progress today in length of ambulation and general tolerance of activities.  She still requires CGA for activities in standing for safety.  PT to progress to STGs/LTGs.   PT Next Visit Plan Check STGs, review prosthetic care, HEP, gait with RW.   Consulted and Agree with Plan of Care Patient;Family member/caregiver   Family Member Consulted mother        Problem List Patient Active Problem List   Diagnosis Date Noted  . Major depression, recurrent 03/19/2014  . Postprocedural state 12/31/2013  . Lupus anticoagulant disorder 12/20/2013  . Back pain, chronic 11/07/2013  . Absence of bladder continence 08/05/2013  . History of anticoagulant therapy 07/17/2013  . Epilepsy with intractable epilepsy 02/28/2013  . Absolute anemia 02/28/2013  . Seizures 02/21/2013  . Seizure 02/17/2013  . DVT, bilateral lower limbs 11/07/2012  . Hypothyroid 07/04/2012  . Seizure disorder 07/04/2012  . Chronic pain syndrome 07/04/2012  . S/P BKA (below knee amputation) 07/04/2012  . Chronic pain associated with significant psychosocial dysfunction 07/04/2012  . Cerebrovascular accident, late effects 06/05/2012  . Status post above knee amputation  06/08/2011  . Mechanical complication of graft of bone, cartilage, muscle or tendon 05/10/2011  . Depression 05/03/2011  . 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 [place], [place], [address] Phone: [phone]   Fax:  [phone]

## 2014-06-10 ENCOUNTER — Ambulatory Visit: Payer: Medicare Other | Admitting: Physical Therapy

## 2014-06-10 ENCOUNTER — Encounter: Payer: Self-pay | Admitting: Physical Therapy

## 2014-06-10 DIAGNOSIS — Z89612 Acquired absence of left leg above knee: Secondary | ICD-10-CM

## 2014-06-10 DIAGNOSIS — R6889 Other general symptoms and signs: Secondary | ICD-10-CM

## 2014-06-10 DIAGNOSIS — R269 Unspecified abnormalities of gait and mobility: Secondary | ICD-10-CM

## 2014-06-10 DIAGNOSIS — R2689 Other abnormalities of gait and mobility: Secondary | ICD-10-CM

## 2014-06-10 DIAGNOSIS — R531 Weakness: Secondary | ICD-10-CM

## 2014-06-10 DIAGNOSIS — R29818 Other symptoms and signs involving the nervous system: Secondary | ICD-10-CM | POA: Diagnosis not present

## 2014-06-10 NOTE — Therapy (Signed)
[facility] [address] [place], [place], [address] Phone: [phone]   Fax:  [phone]  Physical Therapy Treatment  Patient Details  Name: Cassandra Andrade MRN: [id number] Date of Birth: 05-19-1965 Referring Provider:  Fran Lowes, MD  Encounter Date: 06/10/2014      PT End of Session - 06/10/14 1445    Visit Number 6   Number of Visits 17   Date for PT Re-Evaluation 07/12/14   PT Start Time 6767   PT Stop Time 1530   PT Time Calculation (min) 45 min   Equipment Utilized During Treatment Gait belt   Activity Tolerance Patient tolerated treatment well   Behavior During Therapy [facility] for tasks assessed/performed      Past Medical History  Diagnosis Date  . Anxiety   . Depression   . Seizures   . CVA (cerebrovascular accident due to intracerebral hemorrhage) 2004  . Arthritis   . MRSA (methicillin resistant Staphylococcus aureus)   . S/P BKA (below knee amputation) unilateral     Right   . Thyroid disease   . Cancer     Osteosarcoma, right leg  . 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 DVT (deep venous thrombosis)     Left leg    Past Surgical History  Procedure Laterality Date  . Fracture surgery      Rt. femur, folllowed by MRSA  . Brain surgery  2004    cerebral aneurysm  . Total knee arthroplasty Right   . Above knee leg amputation Right     Osteosarcoma    There were no vitals filed for this visit.  Visit Diagnosis:  Abnormality of gait  Generalized weakness  Decreased functional activity tolerance  Status post above knee amputation of left lower extremity  Balance problems      Subjective Assessment - 06/10/14 1455    Subjective Wearing prosthesis daily 2-3 hrs once per day. No issues with limb.   Currently in Pain? No/denies                         [facility] Adult PT Treatment/Exercise - 06/10/14 1445    Transfers   Transfers Sit to  Stand;Stand to Sit   Sit to Stand With upper extremity assist;From chair/3-in-1;4: Min guard  chair without armrests   Stand to Sit With upper extremity assist;With armrests;To chair/3-in-1;5: Supervision  walker to chairs with & without armrests   Ambulation/Gait   Ambulation/Gait Yes   Ambulation/Gait Assistance 4: Min assist;4: Min guard   Ambulation/Gait Assistance Details manual & verbal cues on pelvic alignment,    Ambulation Distance (Feet) 500 Feet  500' X 1 and 200' X 1   Assistive device Prosthesis;Rolling walker   Gait Pattern Decreased stance time - right;Decreased step length - left;Trunk rotated posteriorly on right;Step-through pattern   Ambulation Surface Indoor;Level   Stairs Yes   Stairs Assistance 4: Min assist   Stairs Assistance Details (indicate cue type and reason) verbal & tactile cues on sequence, prosthesis clearance   Stair Management Technique Two rails;Step to pattern;Forwards   Number of Stairs 4  3 reps   Ramp 4: Min assist  RW & prosthesis   Ramp Details (indicate cue type and reason) cues on technique & posture   Curb 4: Min assist  RW & prosthesis   Curb Details (indicate cue type and reason) cues on technique   Prosthetics   Current prosthetic wear tolerance (days/week)  daily   Current prosthetic wear tolerance (#hours/day)  PT instructed 3 hours 2 times per day  initiate first wear with dressing after bathing   Residual limb condition  no open areas   Education Provided Proper wear schedule/adjustment   Person(s) Educated Patient;Parent(s)   Education Method Explanation   Education Method Verbalized understanding   Donning Prosthesis Minimal assist   Doffing Prosthesis Supervision                PT Education - 06/10/14 1445    Education provided Yes   Education Details increasing activity level with gait with mother   Person(s) Educated Patient;Parent(s)   Methods Explanation   Comprehension Verbalized understanding           PT Short Term Goals - 06/10/14 1445    PT SHORT TERM GOAL #1   Title tolerates wear of prosthesis >50% of awake hours without any skin issues or discomfort (Target Date: 06/14/2014)   Time 4   Period Weeks   Status New   PT SHORT TERM GOAL #2   Title donnes prosthesis correctly with mother cueing / supervising (Target Date: 06/14/2014)   Baseline MET 06/10/14    Time 4   Period Weeks   Status Achieved   PT SHORT TERM GOAL #3   Title stands with rolling walker and prosthesis managing clothes for toileting with supervision. (Target Date: 06/14/2014)   Time 4   Period Weeks   Status New   PT SHORT TERM GOAL #4   Title ambulates 125' with rolling walker & prosthesis with supervision. (Target Date: 06/14/2014)   Baseline MET 06/10/14   Time 4   Period Weeks   Status Achieved           PT Long Term Goals - 05/15/14 1015    PT LONG TERM GOAL #1   Title tolerates wear of prosthesis >75% of awake hours without change in skin integrity or tenderness. (Target Date: 07/12/2014)   Time 8   Period Weeks   Status New   PT LONG TERM GOAL #2   Title donnes prosthesis modified independent and mother verbalizes proper prosthetic care. (Target Date: 07/12/2014)   Time 8   Period Weeks   Status New   PT LONG TERM GOAL #3   Title ambulates 250' with rolling walker & prosthesis with supervision. (Target Date: 07/12/2014)   Time 8   Period Weeks   Status New   PT LONG TERM GOAL #4   Title negotiates ramps & curbs with rolling walker and stairs with 2 hands on 1 rail with supervision. (Target Date: 07/12/2014)   Time 8   Period Weeks   Status New   PT LONG TERM GOAL #5   Title standing balance with rolling walker & prosthesis reaching 10" anteriorly, across midline and to floor with supervision. (Target Date: 07/12/2014)   Time 8   Period Weeks   Status New               Plan - 06/10/14 1445    Clinical Impression Statement Patient improved gait with RW & prosthesis with manual  cues of pelvic motion. Patient met 2 of 4 STGs.   Pt will benefit from skilled therapeutic intervention in order to improve on the following deficits Abnormal gait;Decreased activity tolerance;Decreased balance;Decreased cognition;Decreased endurance;Decreased knowledge of precautions;Decreased knowledge of use of DME;Decreased mobility;Decreased range of motion   Rehab Potential Good   Clinical Impairments Affecting Rehab Potential cognition   PT Frequency 2x /  week   PT Duration 8 weeks   PT Treatment/Interventions ADLs/Self Care Home Management;DME Instruction;Gait training;Stair training;Functional mobility training;Therapeutic activities;Therapeutic exercise;Balance training;Neuromuscular re-education;Patient/family education;Other (comment)  prosthetic training   PT Next Visit Plan assess remaining 2 STGs, gait & balance with prosthesis, instruct in adjusting ply socks with suction ring   Consulted and Agree with Plan of Care Patient;Family member/caregiver   Family Member Consulted mother        Problem List Patient Active Problem List   Diagnosis Date Noted  . Major depression, recurrent 03/19/2014  . Postprocedural state 12/31/2013  . Lupus anticoagulant disorder 12/20/2013  . Back pain, chronic 11/07/2013  . Absence of bladder continence 08/05/2013  . History of anticoagulant therapy 07/17/2013  . Epilepsy with intractable epilepsy 02/28/2013  . Absolute anemia 02/28/2013  . Seizures 02/21/2013  . Seizure 02/17/2013  . DVT, bilateral lower limbs 11/07/2012  . Hypothyroid 07/04/2012  . Seizure disorder 07/04/2012  . Chronic pain syndrome 07/04/2012  . S/P BKA (below knee amputation) 07/04/2012  . Chronic pain associated with significant psychosocial dysfunction 07/04/2012  . Cerebrovascular accident, late effects 06/05/2012  . Status post above knee amputation 06/08/2011  . Mechanical complication of graft of bone, cartilage, muscle or tendon 05/10/2011  . Depression  05/03/2011  . 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 [place], [place], [address] Phone: [phone]   Fax:  [phone]

## 2014-06-12 ENCOUNTER — Ambulatory Visit: Payer: Medicare Other | Admitting: Physical Therapy

## 2014-06-12 ENCOUNTER — Encounter: Payer: Self-pay | Admitting: Physical Therapy

## 2014-06-12 DIAGNOSIS — R6889 Other general symptoms and signs: Secondary | ICD-10-CM

## 2014-06-12 DIAGNOSIS — R29818 Other symptoms and signs involving the nervous system: Secondary | ICD-10-CM | POA: Diagnosis not present

## 2014-06-12 DIAGNOSIS — R2689 Other abnormalities of gait and mobility: Secondary | ICD-10-CM

## 2014-06-12 DIAGNOSIS — R269 Unspecified abnormalities of gait and mobility: Secondary | ICD-10-CM

## 2014-06-12 DIAGNOSIS — R531 Weakness: Secondary | ICD-10-CM

## 2014-06-12 DIAGNOSIS — Z89612 Acquired absence of left leg above knee: Secondary | ICD-10-CM

## 2014-06-12 NOTE — Therapy (Signed)
[facility] [address] [place], [place], [address] Phone: [phone]   Fax:  [phone]  Physical Therapy Treatment  Patient Details  Name: Cassandra Andrade MRN: [id number] Date of Birth: 1965/12/17 Referring Provider:  Fran Lowes, MD  Encounter Date: 06/12/2014      PT End of Session - 06/12/14 1315    Visit Number 7   Number of Visits 17   Date for PT Re-Evaluation 07/12/14   PT Start Time 1315   PT Stop Time 1400   PT Time Calculation (min) 45 min   Equipment Utilized During Treatment Gait belt   Activity Tolerance Patient tolerated treatment well   Behavior During Therapy [facility] for tasks assessed/performed      Past Medical History  Diagnosis Date  . Anxiety   . Depression   . Seizures   . CVA (cerebrovascular accident due to intracerebral hemorrhage) 2004  . Arthritis   . MRSA (methicillin resistant Staphylococcus aureus)   . S/P BKA (below knee amputation) unilateral     Right   . Thyroid disease   . Cancer     Osteosarcoma, right leg  . 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 DVT (deep venous thrombosis)     Left leg    Past Surgical History  Procedure Laterality Date  . Fracture surgery      Rt. femur, folllowed by MRSA  . Brain surgery  2004    cerebral aneurysm  . Total knee arthroplasty Right   . Above knee leg amputation Right     Osteosarcoma    There were no vitals filed for this visit.  Visit Diagnosis:  Abnormality of gait  Generalized weakness  Decreased functional activity tolerance  Status post above knee amputation of left lower extremity  Balance problems      Subjective Assessment - 06/12/14 1339    Subjective wearing prosthesis 2-4 hours once per day. no issues.   Currently in Pain? No/denies     Prosthetic Training: PT instructed patient & mother in increasing wear from after bathing until getting ready to lie in bed for night to watch  tv; remove if limb becomes sore. PT demo / instructed in donning with sock. Mother & pt verbalized understanding.  w/c to /from RW sit to/from stand with cues for prosthetic control. Patient ambulated 70', negotiated ramp & curb with minA and ambulated 70' back to w/c 2 times; SBA on level surfaces. Verbal & tactile cues on posture, RW position and wt shift over prosthesis in stance. Patient managed clothes in standing with RW & prosthesis with SBA.                               PT Short Term Goals - 06/12/14 1315    PT SHORT TERM GOAL #1   Title tolerates wear of prosthesis >50% of awake hours without any skin issues or discomfort (Target Date: 06/14/2014)   Baseline NOT MET 06/12/14   Time 4   Period Weeks   Status Not Met   PT SHORT TERM GOAL #2   Title donnes prosthesis correctly with mother cueing / supervising (Target Date: 06/14/2014)   Baseline MET 06/10/14    Time 4   Period Weeks   Status Achieved   PT SHORT TERM GOAL #3   Title stands with rolling walker and prosthesis managing clothes for toileting with supervision. (Target Date: 06/14/2014)   Baseline  MET 06/12/14   Time 4   Period Weeks   Status Achieved   PT SHORT TERM GOAL #4   Title ambulates 125' with rolling walker & prosthesis with supervision. (Target Date: 06/14/2014)   Baseline MET 06/10/14   Time 4   Period Weeks   Status Achieved           PT Long Term Goals - 05/15/14 1015    PT LONG TERM GOAL #1   Title tolerates wear of prosthesis >75% of awake hours without change in skin integrity or tenderness. (Target Date: 07/12/2014)   Time 8   Period Weeks   Status New   PT LONG TERM GOAL #2   Title donnes prosthesis modified independent and mother verbalizes proper prosthetic care. (Target Date: 07/12/2014)   Time 8   Period Weeks   Status New   PT LONG TERM GOAL #3   Title ambulates 250' with rolling walker & prosthesis with supervision. (Target Date: 07/12/2014)   Time 8   Period  Weeks   Status New   PT LONG TERM GOAL #4   Title negotiates ramps & curbs with rolling walker and stairs with 2 hands on 1 rail with supervision. (Target Date: 07/12/2014)   Time 8   Period Weeks   Status New   PT LONG TERM GOAL #5   Title standing balance with rolling walker & prosthesis reaching 10" anteriorly, across midline and to floor with supervision. (Target Date: 07/12/2014)   Time 8   Period Weeks   Status New               Plan - 06/12/14 1315    Clinical Impression Statement Patient improved distance and ability to negotiate barriers with instruction. Patient and mother appear to understand how to  donne prothesis with socks. MET  3 of 4 STGs     Pt will benefit from skilled therapeutic intervention in order to improve on the following deficits Abnormal gait;Decreased activity tolerance;Decreased cognition;Decreased endurance;Decreased knowledge of precautions;Decreased knowledge of use of DME;Decreased mobility;Decreased range of motion   Rehab Potential Good   Clinical Impairments Affecting Rehab Potential cognition   PT Frequency 2x / week   PT Duration 8 weeks   PT Treatment/Interventions ADLs/Self Care Home Management;DME Instruction;Gait training;Stair training;Functional mobility training;Therapeutic activities;Therapeutic exercise;Balance training;Neuromuscular re-education;Patient/family education;Other (comment)  prosthetic training   PT Next Visit Plan assess remaining 2 STGs, gait & balance with prosthesis, instruct in adjusting ply socks with suction ring   Consulted and Agree with Plan of Care Patient;Family member/caregiver   Family Member Consulted mother        Problem List Patient Active Problem List   Diagnosis Date Noted  . Major depression, recurrent 03/19/2014  . Postprocedural state 12/31/2013  . Lupus anticoagulant disorder 12/20/2013  . Back pain, chronic 11/07/2013  . Absence of bladder continence 08/05/2013  . History of  anticoagulant therapy 07/17/2013  . Epilepsy with intractable epilepsy 02/28/2013  . Absolute anemia 02/28/2013  . Seizures 02/21/2013  . Seizure 02/17/2013  . DVT, bilateral lower limbs 11/07/2012  . Hypothyroid 07/04/2012  . Seizure disorder 07/04/2012  . Chronic pain syndrome 07/04/2012  . S/P BKA (below knee amputation) 07/04/2012  . Chronic pain associated with significant psychosocial dysfunction 07/04/2012  . Cerebrovascular accident, late effects 06/05/2012  . Status post above knee amputation 06/08/2011  . Mechanical complication of graft of bone, cartilage, muscle or tendon 05/10/2011  . Depression 05/03/2011  . 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 [place], [place], [address] Phone: [phone]   Fax:  [phone]

## 2014-06-17 ENCOUNTER — Encounter: Payer: Self-pay | Admitting: Physical Therapy

## 2014-06-17 ENCOUNTER — Ambulatory Visit: Payer: Medicare Other | Admitting: Physical Therapy

## 2014-06-17 DIAGNOSIS — R29818 Other symptoms and signs involving the nervous system: Secondary | ICD-10-CM | POA: Diagnosis not present

## 2014-06-17 DIAGNOSIS — R531 Weakness: Secondary | ICD-10-CM

## 2014-06-17 DIAGNOSIS — R2689 Other abnormalities of gait and mobility: Secondary | ICD-10-CM

## 2014-06-17 DIAGNOSIS — R6889 Other general symptoms and signs: Secondary | ICD-10-CM

## 2014-06-17 DIAGNOSIS — Z89612 Acquired absence of left leg above knee: Secondary | ICD-10-CM

## 2014-06-17 DIAGNOSIS — R269 Unspecified abnormalities of gait and mobility: Secondary | ICD-10-CM

## 2014-06-17 NOTE — Therapy (Signed)
[facility] [address] [place], [place], [address] Phone: [phone]   Fax:  [phone]  Physical Therapy Treatment  Patient Details  Name: Cassandra Andrade MRN: [id number] Date of Birth: [birth year] Referring Provider:  Fran Lowes, MD  Encounter Date: 06/17/2014    Past Medical History  Diagnosis Date  . Anxiety   . Depression   . Seizures   . CVA (cerebrovascular accident due to intracerebral hemorrhage) 2004  . Arthritis   . MRSA (methicillin resistant Staphylococcus aureus)   . S/P BKA (below knee amputation) unilateral     Right   . Thyroid disease   . Cancer     Osteosarcoma, right leg  . 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 DVT (deep venous thrombosis)     Left leg    Past Surgical History  Procedure Laterality Date  . Fracture surgery      Rt. femur, folllowed by MRSA  . Brain surgery  2004    cerebral aneurysm  . Total knee arthroplasty Right   . Above knee leg amputation Right     Osteosarcoma    There were no vitals filed for this visit.  Visit Diagnosis:  Abnormality of gait  Generalized weakness  Decreased functional activity tolerance  Status post above knee amputation of left lower extremity  Balance problems      Subjective Assessment - 06/17/14 1445    Subjective Mother reports patient is confused with adding sock still.   Currently in Pain? No/denies       Prosthetic Training: Patient donned sock with verbal  & tactile cues with mother observing. Pt & mother report better understanding. PT instructed to increase wear to all awake hours. Pt & mother verbalized understanding. Patient ambulated with Supervision Device: Rolling walker & prosthesis  Distance: 175' and 250' Multi-modal cues for deviations: posture, pelvic alignment, & prosthetic knee control Patient negotiated ramp with Min. Assist Device: Rolling walker & prosthesis with cues on  technique and wt shift Patient negotiated curb with Contact guard assist Device: Rolling walker & prosthesis with cues on technique Patient negotiated stairs with Supervision Device: Two handrails & prosthesis step-to pattern Sit to stand from chair with armrests with supervision and chair without armrests with minA.                         PT Education - 06/17/14 1445    Education provided Yes   Education Details donning sock, ambulating with mother & RW in / out apt. and limited community distances.   Person(s) Educated Patient;Parent(s)   Methods Explanation   Comprehension Verbalized understanding;Need further instruction          PT Short Term Goals - 06/12/14 1315    PT SHORT TERM GOAL #1   Title tolerates wear of prosthesis >50% of awake hours without any skin issues or discomfort (Target Date: 06/14/2014)   Baseline NOT MET 06/12/14   Time 4   Period Weeks   Status Not Met   PT SHORT TERM GOAL #2   Title donnes prosthesis correctly with mother cueing / supervising (Target Date: 06/14/2014)   Baseline MET 06/10/14    Time 4   Period Weeks   Status Achieved   PT SHORT TERM GOAL #3   Title stands with rolling walker and prosthesis managing clothes for toileting with supervision. (Target Date: 06/14/2014)   Baseline MET 06/12/14   Time 4   Period Weeks  Status Achieved   PT SHORT TERM GOAL #4   Title ambulates 125' with rolling walker & prosthesis with supervision. (Target Date: 06/14/2014)   Baseline MET 06/10/14   Time 4   Period Weeks   Status Achieved           PT Long Term Goals - 05/15/14 1015    PT LONG TERM GOAL #1   Title tolerates wear of prosthesis >75% of awake hours without change in skin integrity or tenderness. (Target Date: 07/12/2014)   Time 8   Period Weeks   Status New   PT LONG TERM GOAL #2   Title donnes prosthesis modified independent and mother verbalizes proper prosthetic care. (Target Date: 07/12/2014)   Time 8   Period  Weeks   Status New   PT LONG TERM GOAL #3   Title ambulates 250' with rolling walker & prosthesis with supervision. (Target Date: 07/12/2014)   Time 8   Period Weeks   Status New   PT LONG TERM GOAL #4   Title negotiates ramps & curbs with rolling walker and stairs with 2 hands on 1 rail with supervision. (Target Date: 07/12/2014)   Time 8   Period Weeks   Status New   PT LONG TERM GOAL #5   Title standing balance with rolling walker & prosthesis reaching 10" anteriorly, across midline and to floor with supervision. (Target Date: 07/12/2014)   Time 8   Period Weeks   Status New               Plan - 06/17/14 1445    Clinical Impression Statement Patient was able to improve her distance & ramp / curb to allow limited community access with RW with mother's assist. Patient appears to understand donning with socks better.   Pt will benefit from skilled therapeutic intervention in order to improve on the following deficits Abnormal gait;Decreased activity tolerance;Decreased cognition;Decreased endurance;Decreased knowledge of precautions;Decreased knowledge of use of DME;Decreased mobility;Decreased range of motion   Rehab Potential Good   Clinical Impairments Affecting Rehab Potential cognition   PT Frequency 2x / week   PT Duration 8 weeks   PT Treatment/Interventions ADLs/Self Care Home Management;DME Instruction;Gait training;Stair training;Functional mobility training;Therapeutic activities;Therapeutic exercise;Balance training;Neuromuscular re-education;Patient/family education;Other (comment)  prosthetic training   PT Next Visit Plan work towards [place], gait & balance with prosthesis, instruct in adjusting ply socks with suction ring   Consulted and Agree with Plan of Care Patient;Family member/caregiver   Family Member Consulted mother        Problem List Patient Active Problem List   Diagnosis Date Noted  . Major depression, recurrent 03/19/2014  . Postprocedural state  12/31/2013  . Lupus anticoagulant disorder 12/20/2013  . Back pain, chronic 11/07/2013  . Absence of bladder continence 08/05/2013  . History of anticoagulant therapy 07/17/2013  . Epilepsy with intractable epilepsy 02/28/2013  . Absolute anemia 02/28/2013  . Seizures 02/21/2013  . Seizure 02/17/2013  . DVT, bilateral lower limbs 11/07/2012  . Hypothyroid 07/04/2012  . Seizure disorder 07/04/2012  . Chronic pain syndrome 07/04/2012  . S/P BKA (below knee amputation) 07/04/2012  . Chronic pain associated with significant psychosocial dysfunction 07/04/2012  . Cerebrovascular accident, late effects 06/05/2012  . Status post above knee amputation 06/08/2011  . Mechanical complication of graft of bone, cartilage, muscle or tendon 05/10/2011  . Depression 05/03/2011  . 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

## 2014-06-19 ENCOUNTER — Encounter: Payer: Self-pay | Admitting: Physical Therapy

## 2014-06-19 ENCOUNTER — Ambulatory Visit: Payer: Medicare Other | Admitting: Physical Therapy

## 2014-06-19 DIAGNOSIS — R2689 Other abnormalities of gait and mobility: Secondary | ICD-10-CM

## 2014-06-19 DIAGNOSIS — Z89612 Acquired absence of left leg above knee: Secondary | ICD-10-CM

## 2014-06-19 DIAGNOSIS — R6889 Other general symptoms and signs: Secondary | ICD-10-CM

## 2014-06-19 DIAGNOSIS — R531 Weakness: Secondary | ICD-10-CM

## 2014-06-19 DIAGNOSIS — R29818 Other symptoms and signs involving the nervous system: Secondary | ICD-10-CM | POA: Diagnosis not present

## 2014-06-19 DIAGNOSIS — R269 Unspecified abnormalities of gait and mobility: Secondary | ICD-10-CM

## 2014-06-19 NOTE — Therapy (Signed)
[facility] [address] [place], [place], [address] Phone: [phone]   Fax:  [phone]  Physical Therapy Treatment  Patient Details  Name: Cassandra Andrade MRN: [id number] Date of Birth: 10/08/1965 Referring Provider:  Fran Lowes, MD  Encounter Date: 06/19/2014      PT End of Session - 06/19/14 1315    Visit Number 10  error count on visits   Number of Visits 17   Date for PT Re-Evaluation 07/12/14   PT Start Time 1315   PT Stop Time 1400   PT Time Calculation (Andrade) 45 Andrade   Equipment Utilized During Treatment Gait belt   Activity Tolerance Patient tolerated treatment well   Behavior During Therapy [facility] for tasks assessed/performed      Past Medical History  Diagnosis Date  . Anxiety   . Depression   . Seizures   . CVA (cerebrovascular accident due to intracerebral hemorrhage) 2004  . Arthritis   . MRSA (methicillin resistant Staphylococcus aureus)   . S/P BKA (below knee amputation) unilateral     Right   . Thyroid disease   . Cancer     Osteosarcoma, right leg  . 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 DVT (deep venous thrombosis)     Left leg    Past Surgical History  Procedure Laterality Date  . Fracture surgery      Rt. femur, folllowed by MRSA  . Brain surgery  2004    cerebral aneurysm  . Total knee arthroplasty Right   . Above knee leg amputation Right     Osteosarcoma    There were no vitals filed for this visit.  Visit Diagnosis:  Abnormality of gait  Generalized weakness  Decreased functional activity tolerance  Status post above knee amputation of left lower extremity  Balance problems      Subjective Assessment - 06/19/14 1330    Subjective CNA reports she has been told she can't walk with patient without her w/c right behind her.    Patient is accompained by: Family member   Currently in Pain? No/denies      Prosthetic Training: Patient  verbalized proper donning with socks.  Sit to/ from stand w/c & chairs with armrests to RW with cues only. Patient ambulated with Supervision Device: Rolling walker & prosthesis  Distance: 200' X 3 on surfaces: indoor Multi-modal cues for deviations: PT adjusted pt's walker ht, PT added theraband with marker for proper step width with verbal cues, posture / RW position Patient negotiated ramp with Andrade. Assist Device: Rolling walker & prosthesis with cues on technique Patient negotiated curb with Contact guard assist Device: Rolling walker & prosthesis with cues on technique Patient negotiated stairs with Contact guard assist Device: Two handrails & prosthesis step-to pattern with cues on technique 4 steps  3 reps                            PT Education - 06/19/14 1315    Education provided Yes   Education Details gait around house & limited community with RW with her mother   Person(s) Educated Patient;Parent(s)   Methods Explanation;Demonstration   Comprehension Verbalized understanding;Returned demonstration;Verbal cues required;Tactile cues required;Need further instruction          PT Short Term Goals - 06/12/14 1315    PT SHORT TERM GOAL #1   Title tolerates wear of prosthesis >50% of awake hours without any skin  issues or discomfort (Target Date: 06/14/2014)   Baseline NOT MET 06/12/14   Time 4   Period Weeks   Status Not Met   PT SHORT TERM GOAL #2   Title donnes prosthesis correctly with mother cueing / supervising (Target Date: 06/14/2014)   Baseline MET 06/10/14    Time 4   Period Weeks   Status Achieved   PT SHORT TERM GOAL #3   Title stands with rolling walker and prosthesis managing clothes for toileting with supervision. (Target Date: 06/14/2014)   Baseline MET 06/12/14   Time 4   Period Weeks   Status Achieved   PT SHORT TERM GOAL #4   Title ambulates 125' with rolling walker & prosthesis with supervision. (Target Date: 06/14/2014)   Baseline  MET 06/10/14   Time 4   Period Weeks   Status Achieved           PT Long Term Goals - 05/15/14 1015    PT LONG TERM GOAL #1   Title tolerates wear of prosthesis >75% of awake hours without change in skin integrity or tenderness. (Target Date: 07/12/2014)   Time 8   Period Weeks   Status New   PT LONG TERM GOAL #2   Title donnes prosthesis modified independent and mother verbalizes proper prosthetic care. (Target Date: 07/12/2014)   Time 8   Period Weeks   Status New   PT LONG TERM GOAL #3   Title ambulates 250' with rolling walker & prosthesis with supervision. (Target Date: 07/12/2014)   Time 8   Period Weeks   Status New   PT LONG TERM GOAL #4   Title negotiates ramps & curbs with rolling walker and stairs with 2 hands on 1 rail with supervision. (Target Date: 07/12/2014)   Time 8   Period Weeks   Status New   PT LONG TERM GOAL #5   Title standing balance with rolling walker & prosthesis reaching 10" anteriorly, across midline and to floor with supervision. (Target Date: 07/12/2014)   Time 8   Period Weeks   Status New               Plan - 06-30-14 1315    Clinical Impression Statement Patient's gait was more stable with less assistance required with cues on controlling adduction of prosthesis. Placing a visual object for step width on RW improved  gait.   Pt will benefit from skilled therapeutic intervention in order to improve on the following deficits Abnormal gait;Decreased activity tolerance;Decreased cognition;Decreased endurance;Decreased knowledge of precautions;Decreased knowledge of use of DME;Decreased mobility;Decreased range of motion   Rehab Potential Good   Clinical Impairments Affecting Rehab Potential cognition   PT Frequency 2x / week   PT Duration 8 weeks   PT Treatment/Interventions ADLs/Self Care Home Management;DME Instruction;Gait training;Stair training;Functional mobility training;Therapeutic activities;Therapeutic exercise;Balance  training;Neuromuscular re-education;Patient/family education;Other (comment)  prosthetic training   PT Next Visit Plan work towards [place], gait & balance with prosthesis, instruct in adjusting ply socks with suction ring   Consulted and Agree with Plan of Care Patient;Family member/caregiver   Family Member Consulted mother          G-Codes - 30-Jun-2014 1315    Functional Assessment Tool Used donnes prosthesis with verbal cues with mother correctly, wears prosthesis >8 hrs per day daily without issues.   Functional Limitation Self care   Self Care Current Status [phone]) At least 40 percent but less than 60 percent impaired, limited or restricted   Self Care Goal  Status [phone]) At least 20 percent but less than 40 percent impaired, limited or restricted      Problem List Patient Active Problem List   Diagnosis Date Noted  . Major depression, recurrent 03/19/2014  . Postprocedural state 12/31/2013  . Lupus anticoagulant disorder 12/20/2013  . Back pain, chronic 11/07/2013  . Absence of bladder continence 08/05/2013  . History of anticoagulant therapy 07/17/2013  . Epilepsy with intractable epilepsy 02/28/2013  . Absolute anemia 02/28/2013  . Seizures 02/21/2013  . Seizure 02/17/2013  . DVT, bilateral lower limbs 11/07/2012  . Hypothyroid 07/04/2012  . Seizure disorder 07/04/2012  . Chronic pain syndrome 07/04/2012  . S/P BKA (below knee amputation) 07/04/2012  . Chronic pain associated with significant psychosocial dysfunction 07/04/2012  . Cerebrovascular accident, late effects 06/05/2012  . Status post above knee amputation 06/08/2011  . Mechanical complication of graft of bone, cartilage, muscle or tendon 05/10/2011  . Depression 05/03/2011  . 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

## 2014-06-24 ENCOUNTER — Encounter: Payer: Self-pay | Admitting: Physical Therapy

## 2014-06-24 ENCOUNTER — Ambulatory Visit: Payer: Medicare Other | Attending: Family | Admitting: Physical Therapy

## 2014-06-24 DIAGNOSIS — R29818 Other symptoms and signs involving the nervous system: Secondary | ICD-10-CM | POA: Diagnosis not present

## 2014-06-24 DIAGNOSIS — R6889 Other general symptoms and signs: Secondary | ICD-10-CM | POA: Diagnosis not present

## 2014-06-24 DIAGNOSIS — Z89612 Acquired absence of left leg above knee: Secondary | ICD-10-CM

## 2014-06-24 DIAGNOSIS — R2689 Other abnormalities of gait and mobility: Secondary | ICD-10-CM

## 2014-06-24 DIAGNOSIS — M24652 Ankylosis, left hip: Secondary | ICD-10-CM | POA: Diagnosis not present

## 2014-06-24 DIAGNOSIS — R531 Weakness: Secondary | ICD-10-CM

## 2014-06-24 DIAGNOSIS — R269 Unspecified abnormalities of gait and mobility: Secondary | ICD-10-CM

## 2014-06-24 NOTE — Therapy (Signed)
[facility] [address] [place], [place], [address] Phone: [phone]   Fax:  [phone]  Physical Therapy Treatment  Patient Details  Name: Cassandra Andrade MRN: [id number] Date of Birth: [birth year] Referring Provider:  Fran Lowes, MD  Encounter Date: 06/24/2014      PT End of Session - 06/24/14 1628    Visit Number 11   PT Start Time 1450   PT Stop Time 0932   PT Time Calculation (min) 40 min   Equipment Utilized During Treatment Gait belt   Activity Tolerance Patient tolerated treatment well   Behavior During Therapy [facility] for tasks assessed/performed      Past Medical History  Diagnosis Date  . Anxiety   . Depression   . Seizures   . CVA (cerebrovascular accident due to intracerebral hemorrhage) 2004  . Arthritis   . MRSA (methicillin resistant Staphylococcus aureus)   . S/P BKA (below knee amputation) unilateral     Right   . Thyroid disease   . Cancer     Osteosarcoma, right leg  . 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 DVT (deep venous thrombosis)     Left leg    Past Surgical History  Procedure Laterality Date  . Fracture surgery      Rt. femur, folllowed by MRSA  . Brain surgery  2004    cerebral aneurysm  . Total knee arthroplasty Right   . Above knee leg amputation Right     Osteosarcoma    There were no vitals filed for this visit.  Visit Diagnosis:  Abnormality of gait  Generalized weakness  Decreased functional activity tolerance  Status post above knee amputation of left lower extremity  Balance problems      Subjective Assessment - 06/24/14 1453    Subjective Pt states she walked alone with walker with no issues. expresses difficulty transfering from [facility] to toilet    Currently in Pain? No/denies            [facility] PT Assessment - 06/24/14 1450    Assessment   Medical Diagnosis right Transfemoral Amputation   Onset Date 04/18/14                      [facility] Adult PT Treatment/Exercise - 06/24/14 0001    Transfers   Transfers Sit to Stand;Stand to Sit   Sit to Stand With upper extremity assist;4: Min guard;From toilet   Sit to Stand Details (indicate cue type and reason) cueing for walker placement and turning in home bathroom,   Stand to Sit 4: Min guard;With upper extremity assist;To toilet   Stand to Sit Details cueing for hand placement and UE support   Ambulation/Gait   Ambulation/Gait Yes   Ambulation/Gait Assistance 4: Min guard   Ambulation/Gait Assistance Details Cueing for prosthesis placement, pt tends to ADD with both LE   Ambulation Distance (Feet) 150 Feet  150'x1, 100' x1   Assistive device Prosthesis;Rolling walker   Gait Pattern Decreased stance time - right;Decreased step length - left;Trunk rotated posteriorly on right;Step-through pattern   Ambulation Surface Level;Indoor   Stairs No   Ramp 4: Min assist   Ramp Details (indicate cue type and reason) verbal cues for weightbearing through prosthesis, locking and unlocking knee   Curb 4: Min assist   Curb Details (indicate cue type and reason) verbal cues for Interior and spatial designer No   Prosthetics   Prosthetic  Care Comments  toileting with prosthesis   Current prosthetic wear tolerance (days/week)  daily   Current prosthetic wear tolerance (#hours/day)  all awake hours  After bathing until bedtime   Current prosthetic weight-bearing tolerance (hours/day)  tolerating full WB   Residual limb condition  no open areas   Education Provided Correct ply sock adjustment   Person(s) Educated Patient;Parent(s)   Education Method Explanation   Education Method Verbalized understanding                PT Education - 06/24/14 1627    Education Details See prosthetic care in flow sheet. Proper sequencing of prosthesis and walker in home bathroom for safe toileting. Amb with RW in the house with mother  or CNA if approved by CNA supervisor   Person(s) Educated Patient;Parent(s)   Methods Demonstration;Explanation;Verbal cues   Comprehension Verbalized understanding;Returned demonstration;Verbal cues required;Need further instruction          PT Short Term Goals - 06/12/14 1315    PT SHORT TERM GOAL #1   Title tolerates wear of prosthesis >50% of awake hours without any skin issues or discomfort (Target Date: 06/14/2014)   Baseline NOT MET 06/12/14   Time 4   Period Weeks   Status Not Met   PT SHORT TERM GOAL #2   Title donnes prosthesis correctly with mother cueing / supervising (Target Date: 06/14/2014)   Baseline MET 06/10/14    Time 4   Period Weeks   Status Achieved   PT SHORT TERM GOAL #3   Title stands with rolling walker and prosthesis managing clothes for toileting with supervision. (Target Date: 06/14/2014)   Baseline MET 06/12/14   Time 4   Period Weeks   Status Achieved   PT SHORT TERM GOAL #4   Title ambulates 125' with rolling walker & prosthesis with supervision. (Target Date: 06/14/2014)   Baseline MET 06/10/14   Time 4   Period Weeks   Status Achieved           PT Long Term Goals - 05/15/14 1015    PT LONG TERM GOAL #1   Title tolerates wear of prosthesis >75% of awake hours without change in skin integrity or tenderness. (Target Date: 07/12/2014)   Time 8   Period Weeks   Status New   PT LONG TERM GOAL #2   Title donnes prosthesis modified independent and mother verbalizes proper prosthetic care. (Target Date: 07/12/2014)   Time 8   Period Weeks   Status New   PT LONG TERM GOAL #3   Title ambulates 250' with rolling walker & prosthesis with supervision. (Target Date: 07/12/2014)   Time 8   Period Weeks   Status New   PT LONG TERM GOAL #4   Title negotiates ramps & curbs with rolling walker and stairs with 2 hands on 1 rail with supervision. (Target Date: 07/12/2014)   Time 8   Period Weeks   Status New   PT LONG TERM GOAL #5   Title standing balance  with rolling walker & prosthesis reaching 10" anteriorly, across midline and to floor with supervision. (Target Date: 07/12/2014)   Time 8   Period Weeks   Status New               Plan - 06/24/14 1629    Clinical Impression Statement Pt still requires cueing on ADD of prosthesis and non amputated limb. Pt able to sit and stand safely from toilet with home simulation and proper hand  placement for UE support.    Pt will benefit from skilled therapeutic intervention in order to improve on the following deficits Abnormal gait;Decreased activity tolerance;Decreased cognition;Decreased endurance;Decreased knowledge of precautions;Decreased knowledge of use of DME;Decreased mobility;Decreased range of motion   Rehab Potential Good   Clinical Impairments Affecting Rehab Potential cognition   PT Frequency 2x / week   PT Duration 8 weeks   PT Treatment/Interventions ADLs/Self Care Home Management;DME Instruction;Gait training;Stair training;Functional mobility training;Therapeutic activities;Therapeutic exercise;Balance training;Neuromuscular re-education;Patient/family education;Other (comment)   PT Next Visit Plan work towards [place], gait and dynamic balance with prosthesis, safe toilet transfers   Consulted and Agree with Plan of Care Patient;Family member/caregiver   Family Member Consulted mother        Problem List Patient Active Problem List   Diagnosis Date Noted  . Major depression, recurrent 03/19/2014  . Postprocedural state 12/31/2013  . Lupus anticoagulant disorder 12/20/2013  . Back pain, chronic 11/07/2013  . Absence of bladder continence 08/05/2013  . History of anticoagulant therapy 07/17/2013  . Epilepsy with intractable epilepsy 02/28/2013  . Absolute anemia 02/28/2013  . Seizures 02/21/2013  . Seizure 02/17/2013  . DVT, bilateral lower limbs 11/07/2012  . Hypothyroid 07/04/2012  . Seizure disorder 07/04/2012  . Chronic pain syndrome 07/04/2012  . S/P BKA (below  knee amputation) 07/04/2012  . Chronic pain associated with significant psychosocial dysfunction 07/04/2012  . Cerebrovascular accident, late effects 06/05/2012  . Status post above knee amputation 06/08/2011  . Mechanical complication of graft of bone, cartilage, muscle or tendon 05/10/2011  . Depression 05/03/2011  . 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

## 2014-06-25 ENCOUNTER — Ambulatory Visit (INDEPENDENT_AMBULATORY_CARE_PROVIDER_SITE_OTHER): Payer: Medicare Other | Admitting: Psychology

## 2014-06-25 DIAGNOSIS — F330 Major depressive disorder, recurrent, mild: Secondary | ICD-10-CM
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
## 2014-06-26 ENCOUNTER — Ambulatory Visit: Payer: Medicare Other | Admitting: Physical Therapy

## 2014-07-01 ENCOUNTER — Ambulatory Visit: Payer: Medicare Other | Admitting: Physical Therapy

## 2014-07-01 ENCOUNTER — Encounter: Payer: Self-pay | Admitting: Physical Therapy

## 2014-07-01 DIAGNOSIS — R29818 Other symptoms and signs involving the nervous system: Secondary | ICD-10-CM | POA: Diagnosis not present

## 2014-07-01 DIAGNOSIS — R6889 Other general symptoms and signs: Secondary | ICD-10-CM

## 2014-07-01 DIAGNOSIS — R531 Weakness: Secondary | ICD-10-CM

## 2014-07-01 DIAGNOSIS — Z89612 Acquired absence of left leg above knee: Secondary | ICD-10-CM

## 2014-07-01 DIAGNOSIS — R269 Unspecified abnormalities of gait and mobility: Secondary | ICD-10-CM

## 2014-07-01 DIAGNOSIS — R2689 Other abnormalities of gait and mobility: Secondary | ICD-10-CM

## 2014-07-01 NOTE — Therapy (Signed)
[facility] [address] [place], [place], [address] Phone: [phone]   Fax:  [phone]  Physical Therapy Treatment  Patient Details  Name: Cassandra Andrade MRN: [id number] Date of Birth: Aug 26, 1965 Referring Provider:  Fran Lowes, MD  Encounter Date: 07/01/2014      PT End of Session - 07/01/14 1404    Visit Number 12   Number of Visits 17   Date for PT Re-Evaluation 07/12/14   PT Start Time 1410   PT Stop Time 1453   PT Time Calculation (min) 43 min   Equipment Utilized During Treatment Gait belt   Activity Tolerance Patient tolerated treatment well   Behavior During Therapy [facility] for tasks assessed/performed      Past Medical History  Diagnosis Date  . Anxiety   . Depression   . Seizures   . CVA (cerebrovascular accident due to intracerebral hemorrhage) 2004  . Arthritis   . MRSA (methicillin resistant Staphylococcus aureus)   . S/P BKA (below knee amputation) unilateral     Right   . Thyroid disease   . Cancer     Osteosarcoma, right leg  . 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 DVT (deep venous thrombosis)     Left leg    Past Surgical History  Procedure Laterality Date  . Fracture surgery      Rt. femur, folllowed by MRSA  . Brain surgery  2004    cerebral aneurysm  . Total knee arthroplasty Right   . Above knee leg amputation Right     Osteosarcoma    There were no vitals filed for this visit.  Visit Diagnosis:  Abnormality of gait  Generalized weakness  Decreased functional activity tolerance  Status post above knee amputation of left lower extremity  Balance problems      Subjective Assessment - 07/01/14 1404    Subjective Pt had 2 small-med seizures last wednesday and was unable to attend Wednesday's appointment. No other issues to report throughout the weekend.    Patient is accompained by: Family member   Currently in Pain? No/denies     Prosthetic  Training: Sit to stand with  Contact guard assist / cues on technique from w/c, chair without armrests, rollator walker Stand to sit with  Contact guard assist/cues on technique  to w/c, chair without armrests, rollator walker Patient ambulated with Supervision Device: Rolling walker & prosthesis  Distance: 130'x1 on Indoor  Level surface. During 130' gait bout, pt able to negotiate ramp, curb, and stairs with RW.          Patient ambulated with Min. Assist Device: Rollator & prosthesis  Distance: 100'x1 on Indoor  Level surface. Multi-modal cues for deviations: Proper step length and width with prosthesis, safe use of rollator including brakes, position / step length with rollator, Patient negotiated ramp with Min. Assist Device: Rolling walker & prosthesis  Patient negotiated curb with Min. Assist Device: Rolling walker & prosthesis        Patient negotiated stairs with Supervision Device: Two handrails & prosthesis step-to pattern   PT instructed while demonstrating on patient proper donning and signs liner has slipped. Patient and mother verbalized understanding.                         PT Education - 07/01/14 1404    Education provided Yes   Education Details PT demonstrated on pt the proper donning techniques as well as signs  of liner slippage. Pt verbalized understanding, mom was also observing. PT demonstrated safety and usage of rollator vs rolling walker.   Person(s) Educated Patient;Parent(s)   Methods Explanation;Demonstration;Tactile cues;Verbal cues   Comprehension Verbalized understanding;Returned demonstration;Verbal cues required;Need further instruction          PT Short Term Goals - 06/12/14 1315    PT SHORT TERM GOAL #1   Title tolerates wear of prosthesis >50% of awake hours without any skin issues or discomfort (Target Date: 06/14/2014)   Baseline NOT MET 06/12/14   Time 4   Period Weeks   Status Not Met   PT SHORT TERM GOAL #2   Title donnes  prosthesis correctly with mother cueing / supervising (Target Date: 06/14/2014)   Baseline MET 06/10/14    Time 4   Period Weeks   Status Achieved   PT SHORT TERM GOAL #3   Title stands with rolling walker and prosthesis managing clothes for toileting with supervision. (Target Date: 06/14/2014)   Baseline MET 06/12/14   Time 4   Period Weeks   Status Achieved   PT SHORT TERM GOAL #4   Title ambulates 125' with rolling walker & prosthesis with supervision. (Target Date: 06/14/2014)   Baseline MET 06/10/14   Time 4   Period Weeks   Status Achieved           PT Long Term Goals - 05/15/14 1015    PT LONG TERM GOAL #1   Title tolerates wear of prosthesis >75% of awake hours without change in skin integrity or tenderness. (Target Date: 07/12/2014)   Time 8   Period Weeks   Status New   PT LONG TERM GOAL #2   Title donnes prosthesis modified independent and mother verbalizes proper prosthetic care. (Target Date: 07/12/2014)   Time 8   Period Weeks   Status New   PT LONG TERM GOAL #3   Title ambulates 250' with rolling walker & prosthesis with supervision. (Target Date: 07/12/2014)   Time 8   Period Weeks   Status New   PT LONG TERM GOAL #4   Title negotiates ramps & curbs with rolling walker and stairs with 2 hands on 1 rail with supervision. (Target Date: 07/12/2014)   Time 8   Period Weeks   Status New   PT LONG TERM GOAL #5   Title standing balance with rolling walker & prosthesis reaching 10" anteriorly, across midline and to floor with supervision. (Target Date: 07/12/2014)   Time 8   Period Weeks   Status New               Plan - 07/01/14 1404    Clinical Impression Statement Pt requires cues for proper donning of prosthesis and knowing when liner is not fitting correctly. Pt able to ambulate with rollator but not ready to do so at home, without PT supervision.    Pt will benefit from skilled therapeutic intervention in order to improve on the following deficits  Abnormal gait;Decreased activity tolerance;Decreased cognition;Decreased endurance;Decreased knowledge of precautions;Decreased knowledge of use of DME;Decreased mobility;Decreased range of motion   Rehab Potential Good   Clinical Impairments Affecting Rehab Potential cognition   PT Frequency 2x / week   PT Duration 8 weeks   PT Treatment/Interventions ADLs/Self Care Home Management;DME Instruction;Gait training;Stair training;Functional mobility training;Therapeutic activities;Therapeutic exercise;Balance training;Neuromuscular re-education;Patient/family education;Other (comment)   PT Next Visit Plan work towards [place], gait and dynamic balance with prosthesis, walking with rollator.    Consulted and Agree with  Plan of Care Patient;Family member/caregiver   Family Member Consulted mother        Problem List Patient Active Problem List   Diagnosis Date Noted  . Major depression, recurrent 03/19/2014  . Postprocedural state 12/31/2013  . Lupus anticoagulant disorder 12/20/2013  . Back pain, chronic 11/07/2013  . Absence of bladder continence 08/05/2013  . History of anticoagulant therapy 07/17/2013  . Epilepsy with intractable epilepsy 02/28/2013  . Absolute anemia 02/28/2013  . Seizures 02/21/2013  . Seizure 02/17/2013  . DVT, bilateral lower limbs 11/07/2012  . Hypothyroid 07/04/2012  . Seizure disorder 07/04/2012  . Chronic pain syndrome 07/04/2012  . S/P BKA (below knee amputation) 07/04/2012  . Chronic pain associated with significant psychosocial dysfunction 07/04/2012  . Cerebrovascular accident, late effects 06/05/2012  . Status post above knee amputation 06/08/2011  . Mechanical complication of graft of bone, cartilage, muscle or tendon 05/10/2011  . Depression 05/03/2011  . 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 [place], [place] [address]  Kirin Pastorino Blair Kynan [place], [place] 07/01/2014, 8:43 PM  [facility] [address] [place], [place], [address] Phone: [phone]   Fax:  [phone]

## 2014-07-03 ENCOUNTER — Encounter: Payer: Self-pay | Admitting: Physical Therapy

## 2014-07-03 ENCOUNTER — Ambulatory Visit: Payer: Medicare Other | Admitting: Physical Therapy

## 2014-07-03 DIAGNOSIS — R29818 Other symptoms and signs involving the nervous system: Secondary | ICD-10-CM | POA: Diagnosis not present

## 2014-07-03 DIAGNOSIS — R269 Unspecified abnormalities of gait and mobility: Secondary | ICD-10-CM

## 2014-07-03 DIAGNOSIS — R531 Weakness: Secondary | ICD-10-CM

## 2014-07-03 DIAGNOSIS — R6889 Other general symptoms and signs: Secondary | ICD-10-CM

## 2014-07-03 DIAGNOSIS — R2689 Other abnormalities of gait and mobility: Secondary | ICD-10-CM

## 2014-07-03 DIAGNOSIS — Z89612 Acquired absence of left leg above knee: Secondary | ICD-10-CM

## 2014-07-03 NOTE — Therapy (Signed)
[name] [address] [place], [place], [address] Phone: [phone]   Fax:  [phone]  Physical Therapy Treatment  Patient Details  Name: Cassandra Andrade MRN: [id number] Date of Birth: 07-07-1965 Referring Provider:  Fran Lowes, MD  Encounter Date: 07/03/2014      PT End of Session - 07/03/14 1530    Visit Number 13   Number of Visits 17   Date for PT Re-Evaluation 07/12/14   PT Start Time 1455   PT Stop Time 1538   PT Time Calculation (min) 43 min   Equipment Utilized During Treatment Gait belt   Activity Tolerance Patient tolerated treatment well   Behavior During Therapy [facility] for tasks assessed/performed      Past Medical History  Diagnosis Date  . Anxiety   . Depression   . Seizures   . CVA (cerebrovascular accident due to intracerebral hemorrhage) 2004  . Arthritis   . MRSA (methicillin resistant Staphylococcus aureus)   . S/P BKA (below knee amputation) unilateral     Right   . Thyroid disease   . Cancer     Osteosarcoma, right leg  . 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 DVT (deep venous thrombosis)     Left leg    Past Surgical History  Procedure Laterality Date  . Fracture surgery      Rt. femur, folllowed by MRSA  . Brain surgery  2004    cerebral aneurysm  . Total knee arthroplasty Right   . Above knee leg amputation Right     Osteosarcoma    There were no vitals filed for this visit.  Visit Diagnosis:  Abnormality of gait  Generalized weakness  Decreased functional activity tolerance  Status post above knee amputation of left lower extremity  Balance problems      Subjective Assessment - 07/03/14 1530    Subjective Patient reports small seizure on the way to PT today but used stimulator to stop it. Patient's CNA is still "scared" to ambulate with her without w/c directly behind her and she instructs patient to walk stiff legged.   Currently in  Pain? No/denies      Prosthetic Training: Sit to / from stand with  Supervision / cues on technique from chair with & without armrests using UEs. Patient ambulated with Contact guard assist Device: Rolling walker & prosthesis  Distance: 250' X 2 on Outdoor&Indoor  Concrete surface.  Multi-modal cues for deviations: step thru pattern, posture, unlocking prosthesis in swing Patient negotiated ramp with Min. Assist Device: Rolling walker & prosthesis cues on technique Patient negotiated curb with Min. Assist Device: Rolling walker & prosthesis cues on technique. Patient negotiated stairs with Contact guard assist Device: Two handrails& sideways with 2 hands on right rail & prosthesis step-to pattern cues on technique                               PT Short Term Goals - 06/12/14 1315    PT SHORT TERM GOAL #1   Title tolerates wear of prosthesis >50% of awake hours without any skin issues or discomfort (Target Date: 06/14/2014)   Baseline NOT MET 06/12/14   Time 4   Period Weeks   Status Not Met   PT SHORT TERM GOAL #2   Title donnes prosthesis correctly with mother cueing / supervising (Target Date: 06/14/2014)   Baseline MET 06/10/14    Time 4  Period Weeks   Status Achieved   PT SHORT TERM GOAL #3   Title stands with rolling walker and prosthesis managing clothes for toileting with supervision. (Target Date: 06/14/2014)   Baseline MET 06/12/14   Time 4   Period Weeks   Status Achieved   PT SHORT TERM GOAL #4   Title ambulates 125' with rolling walker & prosthesis with supervision. (Target Date: 06/14/2014)   Baseline MET 06/10/14   Time 4   Period Weeks   Status Achieved           PT Long Term Goals - 05/15/14 1015    PT LONG TERM GOAL #1   Title tolerates wear of prosthesis >75% of awake hours without change in skin integrity or tenderness. (Target Date: 07/12/2014)   Time 8   Period Weeks   Status New   PT LONG TERM GOAL #2   Title donnes prosthesis  modified independent and mother verbalizes proper prosthetic care. (Target Date: 07/12/2014)   Time 8   Period Weeks   Status New   PT LONG TERM GOAL #3   Title ambulates 250' with rolling walker & prosthesis with supervision. (Target Date: 07/12/2014)   Time 8   Period Weeks   Status New   PT LONG TERM GOAL #4   Title negotiates ramps & curbs with rolling walker and stairs with 2 hands on 1 rail with supervision. (Target Date: 07/12/2014)   Time 8   Period Weeks   Status New   PT LONG TERM GOAL #5   Title standing balance with rolling walker & prosthesis reaching 10" anteriorly, across midline and to floor with supervision. (Target Date: 07/12/2014)   Time 8   Period Weeks   Status New               Plan - 07/03/14 1530    Clinical Impression Statement Patient was able to increase distance with gait today. Patient improved step thru pattern with cues which decreases number of steps to cover a distance and speeds her gait.   Pt will benefit from skilled therapeutic intervention in order to improve on the following deficits Abnormal gait;Decreased activity tolerance;Decreased cognition;Decreased endurance;Decreased knowledge of precautions;Decreased knowledge of use of DME;Decreased mobility;Decreased range of motion   Rehab Potential Good   Clinical Impairments Affecting Rehab Potential cognition   PT Frequency 2x / week   PT Duration 8 weeks   PT Treatment/Interventions ADLs/Self Care Home Management;DME Instruction;Gait training;Stair training;Functional mobility training;Therapeutic activities;Therapeutic exercise;Balance training;Neuromuscular re-education;Patient/family education;Other (comment)   PT Next Visit Plan work towards LTGs, gait and dynamic balance with prosthesis, walking with rollator.    Consulted and Agree with Plan of Care Patient;Family member/caregiver   Family Member Consulted mother        Problem List Patient Active Problem List   Diagnosis Date  Noted  . Major depression, recurrent 03/19/2014  . Postprocedural state 12/31/2013  . Lupus anticoagulant disorder 12/20/2013  . Back pain, chronic 11/07/2013  . Absence of bladder continence 08/05/2013  . History of anticoagulant therapy 07/17/2013  . Epilepsy with intractable epilepsy 02/28/2013  . Absolute anemia 02/28/2013  . Seizures 02/21/2013  . Seizure 02/17/2013  . DVT, bilateral lower limbs 11/07/2012  . Hypothyroid 07/04/2012  . Seizure disorder 07/04/2012  . Chronic pain syndrome 07/04/2012  . S/P BKA (below knee amputation) 07/04/2012  . Chronic pain associated with significant psychosocial dysfunction 07/04/2012  . Cerebrovascular accident, late effects 06/05/2012  . Status post above knee amputation 06/08/2011  .   Mechanical complication of graft of bone, cartilage, muscle or tendon 05/10/2011  . Depression 05/03/2011  . 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      

## 2014-07-04 ENCOUNTER — Ambulatory Visit (HOSPITAL_BASED_OUTPATIENT_CLINIC_OR_DEPARTMENT_OTHER): Payer: Medicare Other | Admitting: Hematology

## 2014-07-04 ENCOUNTER — Encounter: Payer: Self-pay | Admitting: Hematology

## 2014-07-04 ENCOUNTER — Telehealth: Payer: Self-pay | Admitting: Hematology

## 2014-07-04 ENCOUNTER — Other Ambulatory Visit (HOSPITAL_BASED_OUTPATIENT_CLINIC_OR_DEPARTMENT_OTHER): Payer: Medicare Other

## 2014-07-04 DIAGNOSIS — D6852 Prothrombin gene mutation: Secondary | ICD-10-CM

## 2014-07-04 DIAGNOSIS — Z85830 Personal history of malignant neoplasm of bone: Secondary | ICD-10-CM | POA: Diagnosis not present

## 2014-07-04 DIAGNOSIS — I82403 Acute embolism and thrombosis of unspecified deep veins of lower extremity, bilateral: Secondary | ICD-10-CM

## 2014-07-04 DIAGNOSIS — Z86718 Personal history of other venous thrombosis and embolism: Secondary | ICD-10-CM

## 2014-07-04 DIAGNOSIS — D649 Anemia, unspecified: Secondary | ICD-10-CM

## 2014-07-04 LAB — CBC & DIFF AND RETIC
BASO%: 0.3 % (ref 0.0–2.0)
Basophils Absolute: 0 10*3/uL (ref 0.0–0.1)
EOS ABS: 0 10*3/uL (ref 0.0–0.5)
EOS%: 0.9 % (ref 0.0–7.0)
HCT: 37.8 % (ref 34.8–46.6)
HGB: 12.1 g/dL (ref 11.6–15.9)
Immature Retic Fract: 3.1 % (ref 1.60–10.00)
LYMPH%: 41.1 % (ref 14.0–49.7)
MCH: 29 pg (ref 25.1–34.0)
MCHC: 32 g/dL (ref 31.5–36.0)
MCV: 90.6 fL (ref 79.5–101.0)
MONO#: 0.3 10*3/uL (ref 0.1–0.9)
MONO%: 9.1 % (ref 0.0–14.0)
NEUT#: 1.6 10*3/uL (ref 1.5–6.5)
NEUT%: 48.6 % (ref 38.4–76.8)
Platelets: 181 10*3/uL (ref 145–400)
RBC: 4.17 10*6/uL (ref 3.70–5.45)
RDW: 13.5 % (ref 11.2–14.5)
Retic %: 1.23 % (ref 0.70–2.10)
Retic Ct Abs: 51.29 10*3/uL (ref 33.70–90.70)
WBC: 3.2 10*3/uL — AB (ref 3.9–10.3)
lymph#: 1.3 10*3/uL (ref 0.9–3.3)

## 2014-07-04 LAB — COMPREHENSIVE METABOLIC PANEL (CC13)
ALK PHOS: 96 U/L (ref 40–150)
ALT: 25 U/L (ref 0–55)
AST: 23 U/L (ref 5–34)
Albumin: 3.8 g/dL (ref 3.5–5.0)
Anion Gap: 9 mEq/L (ref 3–11)
BUN: 19.5 mg/dL (ref 7.0–26.0)
CO2: 29 mEq/L (ref 22–29)
Calcium: 9.7 mg/dL (ref 8.4–10.4)
Chloride: 105 mEq/L (ref 98–109)
Creatinine: 1 mg/dL (ref 0.6–1.1)
EGFR: 81 mL/min/{1.73_m2} — ABNORMAL LOW (ref >90)
Glucose: 83 mg/dl (ref 70–140)
POTASSIUM: 4 meq/L (ref 3.5–5.1)
SODIUM: 143 meq/L (ref 136–145)
TOTAL PROTEIN: 7.2 g/dL (ref 6.4–8.3)
Total Bilirubin: 0.27 mg/dL (ref 0.20–1.20)

## 2014-07-04 LAB — IRON AND TIBC CHCC
%SAT: 25 % (ref 21–57)
Iron: 66 ug/dL (ref 41–142)
TIBC: 268 ug/dL (ref 236–444)
UIBC: 201 ug/dL (ref 120–384)

## 2014-07-04 LAB — FERRITIN CHCC: FERRITIN: 238 ng/mL (ref 9–269)

## 2014-07-04 NOTE — Progress Notes (Signed)
[place]  [phone] Fax:([phone]) [place] VISIT Date of Service: 12/20/2013   ID: Coletta Memos OB: 1966/01/26 MR#: [id number] NWG#:[id number] Patient Care Team: Fran Lowes, MD as PCP - General Kathlee Nations, MD as Consulting Physician (Psychiatry)  REASON FOR OFFICE VISIT:  1. Bilateral lower extremity deep venous thrombosis.  2. Right below-knee amputation for osteogenic sarcoma.  3. Multiple miscarriages.  4. Positive lupus anticoagulant, repeated negative   PATIENT IDENTIFICATION  Cassandra Andrade is a pleasant 49 years old African-American female who is coming to established here at [place]. Previously she was being seen by Dr. Burney Gauze at [facility] location. 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 She had radiation and chemotherapy. Should the surgery with the bone prosthesis. 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 It also causes her some memory problems. She was in coma for a while and she has a tracheostomy done and was placed in a long-term care facility. She has history of multiple miscarriages. She does have 3 children. She also had a right AKA because of the right lower leg infection Iin2013. 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 She was initially evaluated by Dr. Burney Gauze on 12/01/2012. She is wheelchair bound. She also have history of situational depression, iron deficiency anemia and hypothyroidism. 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 He also takes antiseizure therapy is no known family history of thromboembolic problems. She had couple of venous duplex studies done last one was on 09/19/2013 showing similar appearing residual chronic thrombus within the common femoral and femoral veins bilaterally. No significant interval change from 04/04/2013  patient's mother have a machine at home where they check her INR and the target INR is between 2-3. Her current Coumadin dose 10 mg daily and the results are being called out to physician who regulates the Coumadin. Patient and her mother would like to continue the same arrangement although I offered her that we have a dedicated Coumadin clinic and we can help in Coumadin regulation if they desire. Because of a positive lupus anticoagulant, a prior history of osteosarcoma, generalized deconditioning and wheelchair bound status, and presence of chronic thrombus on vascular imaging, Dr. Martha Clan recommended lifelong Coumadin.  INTERIM HISTORY: Ms. [name] is back today with her mother for a follow-up. She has been using the right leg prosthesis intermittently at home. She is able to walk around with a walker, but he usually under her mother or nurse assistance's supervising and assistance. She is more active than before, but still spends most time on sitting. She denies any full or injury. She has been taking great daily, and check her INR at home. No history of bleeding.  CURRENT TREATMENT: Coumadin 10 mg  to maintain INR between 2-3  REVIEW OF SYSTEMS: All other 10 point review of systems is negative.    PAST MEDICAL HISTORY: Past Medical History  Diagnosis Date  . Anxiety   . Depression   . Seizures   . CVA (cerebrovascular accident due to intracerebral hemorrhage) 2004  . Arthritis   . MRSA (methicillin resistant Staphylococcus aureus)   . S/P BKA (below knee amputation) unilateral     Right   . Thyroid disease   . Cancer     Osteosarcoma, right leg  .  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 DVT (deep venous thrombosis)     Left leg   PAST SURGICAL HISTORY: Past Surgical History  Procedure Laterality Date  . Fracture surgery      Rt. femur, folllowed by MRSA  . Brain surgery  2004    cerebral aneurysm  . Total knee arthroplasty Right   . Above knee leg  amputation Right     Osteosarcoma   FAMILY HISTORY Family History  Problem Relation Age of Onset  . Anxiety disorder Mother   . Diabetes Father    GYNECOLOGIC HISTORY:  No LMP recorded. Patient has had an ablation.   SOCIAL HISTORY:  History   Social History  . Marital Status: Legally Separated    Spouse Name: N/A  . Number of Children: 3  . Years of Education: GED   Occupational History  . Not on file.   Social History Main Topics  . Smoking status: Never Smoker   . Smokeless tobacco: Never Used     Comment: never used tobacco  . Alcohol Use: No  . Drug Use: No  . Sexual Activity: Not Currently   Other Topics Concern  . Not on file   Social History Narrative   Patient lives at home with mom.    Patient has GED.    Patient has 3 children.    Patient is married but in the process of divorced.          ADVANCED DIRECTIVES: <no information>  HEALTH MAINTENANCE: History  Substance Use Topics  . Smoking status: Never Smoker   . Smokeless tobacco: Never Used     Comment: never used tobacco  . Alcohol Use: No   Colonoscopy: PAP: Bone density: Lipid panel:  Allergies  Allergen Reactions  . Vicodin [Hydrocodone-Acetaminophen] Nausea And Vomiting  . Morphine Rash  . Morphine And Related Rash    Allergic reaction only in iv form    Current Outpatient Prescriptions  Medication Sig Dispense Refill  . Ascorbic Acid (VITAMIN C) 1000 MG tablet Take 1,000 mg by mouth daily.     . Calcium Carbonate-Vitamin D 600-400 MG-UNIT per tablet Take 1 tablet by mouth daily.     [name] DIAZEPAM RE Place 20 mg rectally. Rectally as needed for seizure lasting longer than 2 minutes    . FLUoxetine (PROZAC) 20 MG capsule Take 1 capsule (20 mg total) by mouth 2 (two) times daily. 60 capsule 2  . lacosamide (VIMPAT) 200 MG TABS tablet Take 1 tablet (200 mg total) by mouth 2 (two) times daily. 60 tablet 5  . lamoTRIgine (LAMICTAL) 100 MG tablet Take 250 mg by mouth every evening.  Takes 2.5  Tablets =  250 mg  Every evening.    . lamoTRIgine (LAMICTAL) 25 MG tablet Take 225 mg by mouth every morning. Takes  225 mg  Every  Morning  =  9  Tablets  Of  25 mg.    . levETIRAcetam (KEPPRA) 1000 MG tablet Take 2 tablets (2,000 mg total) by mouth 2 (two) times daily. 120 tablet 6  . levothyroxine (SYNTHROID, LEVOTHROID) 50 MCG tablet Take 50 mcg by mouth daily before breakfast.    . promethazine (PHENERGAN) 25 MG tablet Take 25 mg by mouth every 6 (six) hours as needed. nausea    . warfarin (COUMADIN) 5 MG tablet Take 5 mg by mouth 2 (two) times daily.     No current facility-administered medications for this visit.   OBJECTIVE: Filed Vitals:  07/04/14 1032  BP: 112/67  Pulse: 77  Temp: 98.1 F (36.7 C)  Resp: 18   Body mass index is 29.47 kg/(m^2). ECOG FS: 0 Ocular: Sclerae unicteric, pupils equal, round and reactive to light Ear-nose-throat: Oropharynx clear, dentition fair Lymphatic: No cervical or supraclavicular adenopathy Lungs no rales or rhonchi, good excursion bilaterally Heart regular rate and rhythm, no murmur appreciated Abd soft, nontender, positive bowel sounds MSK no focal spinal tenderness, no joint edema. (+) right AKA Neuro: non-focal, well-oriented, appropriate affect Breasts: Deferred  LAB RESULTS: CBC Latest Ref Rng 07/04/2014 04/04/2014 12/20/2013  WBC 3.9 - 10.3 10e3/uL 3.2(L) 4.0 3.7(L)  Hemoglobin 11.6 - 15.9 g/dL 12.1 11.8 13.0  Hematocrit 34.8 - 46.6 % 37.8 38.3 40.1  Platelets 145 - 400 10e3/uL 181 218 196    CMP Latest Ref Rng 07/11/2013 05/05/2013 02/22/2013  Glucose 73 - 118 mg/dL 126(H) 75 119(H)  BUN 7 - 22 mg/dL 16 11 13   Creatinine 0.6 - 1.2 mg/dl 1.0 0.90 0.91  Sodium 128 - 145 mEq/L 138 139 142  Potassium 3.3 - 4.7 mEq/L 3.6 3.9 3.9  Chloride 98 - 108 mEq/L 97(L) 99 104  CO2 18 - 33 mEq/L 31 - 23  Calcium 8.0 - 10.3 mg/dL 10.0 - 8.8  Total Protein 6.4 - 8.1 g/dL 7.6 - -  Albumin 3.3 - 5.5 g/dL 3.4 - -  Total Bilirubin  0.20 - 1.60 mg/dl 0.40 - -  Alkaline Phos 26 - 84 U/L 102(H) - -  AST 11 - 38 U/L 26 - -  ALT 10 - 47 U/L 27 - -     Lupus anticoagulant panel  Status: Finalresult Visible to patient:  MyChart Nextappt: 04/23/2014 at 01:30 PM in No Special[facility] (YATES,LEANNE, LPC) Dx:  DVT, bilateral lower limbs              Ref Range 32mo ago  24mo ago     PTT Lupus Anticoagulant 28.0 - 43.0 secs 53.5 (H) 61.4 (H)    PTTLA 4:1 Mix 28.0 - 43.0 secs 48.0 (H) 48.2 (H)    PTTLA Confirmation <8.0 secs 2.5 6.2    DRVVT <42.9 secs 42.8 52.7 (H)    DRVVT 1:1 Mix <42.9 secs NOT APPL 37.0    Drvvt confirmation <1.15 Ratio NOT APPL NOT APPL    Lupus Anticoagulant NOT DETECTED  NOT DETECTED NOT DETECTED           STUDIES: US Venous Img Lower Bilateral  09/19/2013   CLINICAL DATA:  Chronic bilateral DVT  EXAM: BILATERAL LOWER EXTREMITY VENOUS DOPPLER ULTRASOUND  TECHNIQUE: Gray-scale sonography with graded compression, as well as color Doppler and duplex ultrasound were performed to evaluate the lower extremity deep venous systems from the level of the common femoral vein and including the common femoral, femoral, profunda femoral, popliteal and calf veins including the posterior tibial, peroneal and gastrocnemius veins when visible. The superficial great saphenous vein was also interrogated. Spectral Doppler was utilized to evaluate flow at rest and with distal augmentation maneuvers in the common femoral, femoral and popliteal veins.  COMPARISON:  None.  FINDINGS: Deep veins: Bilateral lower extremity deep veins demonstrate similar recanalized thrombus with mural thrombus seen throughout both common femoral veins and the femoral veins of the thigh. No significant popliteal or tibial thrombus is identified on the left. The patient is status post amputation of the right lower leg.  Compressibility of deep veins: Partially compressible.  Duplex waveform respiratory phasicity:  Diminished but  Normal.  Augmentation  not performed.  Venous reflux: None visualized.  Other findings: No evidence of superficial thrombophlebitis or abnormal fluid collection.  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 No significant interval change.   Electronically Signed   By: Daryll Brod M.D.   On: 09/19/2013 12:26    ASSESSMENT/PLAN:  Ms. Dolores is a 89 her old [place] American female.  1. History of bilateral DVT, transient Lupus anticoagulant positive, repeat tests were negative. -Her repeated Doppler showed chronic-appearing DVT in July 2015, which has not changed much. -She is wheelchair bound due to her right lower extremity amputation above knee. She has certainly high risks for thrombosis due to her immobilization.  -I explained to her that her repeated Lupus anticoagulant test was negative. -I think she likely need long-term anticoagulation,   although she is interested in getting off Coumadin.  -we again discussed the benefits and risks of long-term anticoagulation. She is currently on physical therapy, If she is able to ambulatory well again with prosthesis, and her repeated ultrasound shows resolved DVT, we could consider taking her off Coumadin in the future.  2. Right lower extremity osteosarcoma, status post amputation above knee. -Doing well, no evidence of recurrence.  Follow-up: 4 months with lab and lower extremity Doppler before visit.   All questions were answered and she is in agreement with the plan. She knows to call here with any questions or concerns and to go to the ED in the event of an emergency. We can certainly see her sooner if need be.   Truitt Merle  07/04/2014

## 2014-07-04 NOTE — Telephone Encounter (Signed)
Pt confirmed labs/ov per 05/12 POF, gave pt AVS and Calendar.... KJ, scheduled pt for doppler.Marland KitchenMarland KitchenMarland Kitchen

## 2014-07-05 ENCOUNTER — Ambulatory Visit (HOSPITAL_COMMUNITY)
Admission: RE | Admit: 2014-07-05 | Discharge: 2014-07-05 | Disposition: A | Discharge status: Home / Self Care | Payer: Medicare Other | Source: Ambulatory Visit | Attending: Hematology | Admitting: Hematology

## 2014-07-05 DIAGNOSIS — Z86718 Personal history of other venous thrombosis and embolism: Secondary | ICD-10-CM | POA: Insufficient documentation

## 2014-07-05 DIAGNOSIS — Z7901 Long term (current) use of anticoagulants: Secondary | ICD-10-CM | POA: Diagnosis not present

## 2014-07-05 DIAGNOSIS — I82403 Acute embolism and thrombosis of unspecified deep veins of lower extremity, bilateral: Secondary | ICD-10-CM

## 2014-07-05 NOTE — Progress Notes (Signed)
*  PRELIMINARY RESULTS* Vascular Ultrasound Lower extremity venous duplex has been completed.  Preliminary findings: Chronic DVT noted in the right common femoral vein. No other DVT noted bilateral lower extremities.   Attempted call report 2x to Dr. Burr Medico number. Let patient leave.   Landry Mellow, RDMS, RVT  07/05/2014, 11:41 AM

## 2014-07-08 ENCOUNTER — Encounter: Payer: Self-pay | Admitting: Physical Therapy

## 2014-07-10 ENCOUNTER — Ambulatory Visit: Payer: Medicare Other | Admitting: Physical Therapy

## 2014-07-11 ENCOUNTER — Encounter: Payer: Self-pay | Admitting: Physical Therapy

## 2014-07-11 ENCOUNTER — Ambulatory Visit: Payer: Medicare Other | Admitting: Physical Therapy

## 2014-07-11 DIAGNOSIS — R2689 Other abnormalities of gait and mobility: Secondary | ICD-10-CM

## 2014-07-11 DIAGNOSIS — R6889 Other general symptoms and signs: Secondary | ICD-10-CM

## 2014-07-11 DIAGNOSIS — Z89612 Acquired absence of left leg above knee: Secondary | ICD-10-CM

## 2014-07-11 DIAGNOSIS — R269 Unspecified abnormalities of gait and mobility: Secondary | ICD-10-CM

## 2014-07-11 DIAGNOSIS — R531 Weakness: Secondary | ICD-10-CM

## 2014-07-11 DIAGNOSIS — R29818 Other symptoms and signs involving the nervous system: Secondary | ICD-10-CM | POA: Diagnosis not present

## 2014-07-11 NOTE — Therapy (Signed)
[facility] [address] [place], [place], [address] Phone: [phone]   Fax:  [phone]  Physical Therapy Treatment  Patient Details  Name: Cassandra Andrade MRN: [id number] Date of Birth: 07/21/1965 Referring Provider:  Smith, Dana M, MD  Encounter Date: 07/11/2014      PT End of Session - 07/11/14 1145    Visit Number 14   Number of Visits 17   Date for PT Re-Evaluation 07/12/14   PT Start Time 1145   PT Stop Time 1230   PT Time Calculation (min) 45 min   Equipment Utilized During Treatment Gait belt   Activity Tolerance Patient tolerated treatment well   Behavior During Therapy WFL for tasks assessed/performed      Past Medical History  Diagnosis Date  . Anxiety   . Depression   . Seizures   . CVA (cerebrovascular accident due to intracerebral hemorrhage) 2004  . Arthritis   . MRSA (methicillin resistant Staphylococcus aureus)   . S/P BKA (below knee amputation) unilateral     Right   . Thyroid disease   . Cancer     Osteosarcoma, right leg  . 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 DVT (deep venous thrombosis)     Left leg    Past Surgical History  Procedure Laterality Date  . Fracture surgery      Rt. femur, folllowed by MRSA  . Brain surgery  2004    cerebral aneurysm  . Total knee arthroplasty Right   . Above knee leg amputation Right     Osteosarcoma    There were no vitals filed for this visit.  Visit Diagnosis:  Abnormality of gait  Generalized weakness  Decreased functional activity tolerance  Balance problems  Status post above knee amputation of left lower extremity      Subjective Assessment - 07/11/14 1155    Subjective Pt reports walking in the community with assistance of her mother. Was able to negotiate car transfer, curb and ambulating into building. Then able to walk down the curb and return to the car. Using the walker more in the home for  functional mobility. Mother reported medium sized seizure this morning prior to PT session, but is doing fine now and ready to participate in PT.    Patient is accompained by: Family member   Currently in Pain? No/denies      Prosthetic Training with AKA prosthesis: Patient arrived to PT ambulating with standard RW & prosthesis with mother's assist. PT verbally cued posture, step width to improve safety. Pt ambulated with rollator and min guard assist of PT on level indoor surfaces, tile and carpet for 55'x1, 75' x 1 with min guard and negotiated ramp and curb min A. Pt requires verbal cues for technique, sequencing, posture & RW distance /safety.  BALANCE: While standing, pt performed rotation of rollator around body in both directions with min guard A. Verbal & tactile cues required for hand placement on rollator and proper trunk rotation in order to maintain balance. This activity to prepare as an option to sit on rollator walker when fatigued.                           PT Education - 07/11/14 1145    Education provided Yes   Education Details PT demonstrated various balance techniques while ambulation with rollator.   Person(s) Educated Patient;Parent(s)   Methods Explanation;Demonstration   Comprehension Verbalized understanding;Returned demonstration;Verbal   cues required;Need further instruction          PT Short Term Goals - 06/12/14 1315    PT SHORT TERM GOAL #1   Title tolerates wear of prosthesis >50% of awake hours without any skin issues or discomfort (Target Date: 06/14/2014)   Baseline NOT MET 06/12/14   Time 4   Period Weeks   Status Not Met   PT SHORT TERM GOAL #2   Title donnes prosthesis correctly with mother cueing / supervising (Target Date: 06/14/2014)   Baseline MET 06/10/14    Time 4   Period Weeks   Status Achieved   PT SHORT TERM GOAL #3   Title stands with rolling walker and prosthesis managing clothes for toileting with supervision.  (Target Date: 06/14/2014)   Baseline MET 06/12/14   Time 4   Period Weeks   Status Achieved   PT SHORT TERM GOAL #4   Title ambulates 125' with rolling walker & prosthesis with supervision. (Target Date: 06/14/2014)   Baseline MET 06/10/14   Time 4   Period Weeks   Status Achieved           PT Long Term Goals - 05/15/14 1015    PT LONG TERM GOAL #1   Title tolerates wear of prosthesis >75% of awake hours without change in skin integrity or tenderness. (Target Date: 07/12/2014)   Time 8   Period Weeks   Status New   PT LONG TERM GOAL #2   Title donnes prosthesis modified independent and mother verbalizes proper prosthetic care. (Target Date: 07/12/2014)   Time 8   Period Weeks   Status New   PT LONG TERM GOAL #3   Title ambulates 250' with rolling walker & prosthesis with supervision. (Target Date: 07/12/2014)   Time 8   Period Weeks   Status New   PT LONG TERM GOAL #4   Title negotiates ramps & curbs with rolling walker and stairs with 2 hands on 1 rail with supervision. (Target Date: 07/12/2014)   Time 8   Period Weeks   Status New   PT LONG TERM GOAL #5   Title standing balance with rolling walker & prosthesis reaching 10" anteriorly, across midline and to floor with supervision. (Target Date: 07/12/2014)   Time 8   Period Weeks   Status New               Plan - 07/11/14 1145    Clinical Impression Statement Pt presents more fatigued and requiring increased timefor processing instructions due to medium seizure earlier this morning prior to PT session. Despite this, she continues to improve balance and endurance during gait with rollator.    Pt will benefit from skilled therapeutic intervention in order to improve on the following deficits Abnormal gait;Decreased activity tolerance;Decreased cognition;Decreased endurance;Decreased knowledge of precautions;Decreased knowledge of use of DME;Decreased mobility;Decreased range of motion   Rehab Potential Good   Clinical  Impairments Affecting Rehab Potential cognition   PT Frequency 2x / week   PT Duration 8 weeks   PT Treatment/Interventions ADLs/Self Care Home Management;DME Instruction;Gait training;Stair training;Functional mobility training;Therapeutic activities;Therapeutic exercise;Balance training;Neuromuscular re-education;Patient/family education;Other (comment)   PT Next Visit Plan work towards LTGs, gait and dynamic balance with prosthesis, walking with rollator, negotiating ramps and curbs with rollator.    Consulted and Agree with Plan of Care Patient;Family member/caregiver   Family Member Consulted mother        Problem List Patient Active Problem List   Diagnosis Date Noted  .   Major depression, recurrent 03/19/2014  . Postprocedural state 12/31/2013  . Lupus anticoagulant disorder 12/20/2013  . Back pain, chronic 11/07/2013  . Absence of bladder continence 08/05/2013  . History of anticoagulant therapy 07/17/2013  . Epilepsy with intractable epilepsy 02/28/2013  . Absolute anemia 02/28/2013  . Seizures 02/21/2013  . Seizure 02/17/2013  . DVT, bilateral lower limbs 11/07/2012  . Hypothyroid 07/04/2012  . Seizure disorder 07/04/2012  . Chronic pain syndrome 07/04/2012  . S/P BKA (below knee amputation) 07/04/2012  . Chronic pain associated with significant psychosocial dysfunction 07/04/2012  . Cerebrovascular accident, late effects 06/05/2012  . Status post above knee amputation 06/08/2011  . Mechanical complication of graft of bone, cartilage, muscle or tendon 05/10/2011  . Depression 05/03/2011  . 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      

## 2014-07-15 ENCOUNTER — Encounter: Payer: Self-pay | Admitting: Nurse Practitioner

## 2014-07-15 ENCOUNTER — Ambulatory Visit (INDEPENDENT_AMBULATORY_CARE_PROVIDER_SITE_OTHER): Payer: Medicare Other | Admitting: Nurse Practitioner

## 2014-07-15 VITALS — BP 109/68 | HR 75 | Ht 67.0 in | Wt 188.0 lb

## 2014-07-15 DIAGNOSIS — G40919 Epilepsy, unspecified, intractable, without status epilepticus: Secondary | ICD-10-CM | POA: Diagnosis not present

## 2014-07-15 MED ORDER — LEVETIRACETAM 1000 MG PO TABS: 2000.0000 mg | ORAL_TABLET | Freq: Two times a day (BID) | ORAL | Status: DC

## 2014-07-15 MED ORDER — LACOSAMIDE 200 MG PO TABS: 200.0000 mg | ORAL_TABLET | Freq: Two times a day (BID) | ORAL | Status: DC

## 2014-07-15 NOTE — Progress Notes (Signed)
[facility]  PATIENT: Cassandra Andrade DOB: September 03, 1965   REASON FOR VISIT: Follow-up for intractable seizure disorder  HISTORY FROM: Patient and mother    HISTORY OF PRESENT ILLNESS:Cassandra Andrade, 49 year old female returns for followup with her mother. She was last seen in this office 01/09/2014 .She has a history of intractable seizures. The patient has a history of a cerebral aneurysm and a right brain stroke. The patient has been on Lamictal and Vimpat, and Keppra was added. The patient has had video EEG monitoring that has confirmed the presence of subclinical seizures as well as clinical seizures without EEG correlates. It is felt that these events are stereotypical, and likely represent focal seizures without EEG correlates. The patient has been in the hospital on 2 occasions in December, once on December 27, and again on December 31. The patient has been placed on Keppra, currently on 2000 mg twice daily. Her seizure frequency is now weekly or a little less. The patient may have episodes of agitation, and slurred speech. The patient is eating well. 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 The patient returns to this office for an evaluation. Recent labs reviewed,  Dr. Raj Janus at [facility] placed  VNS on 12/31/13 at [facility]. The mother and patient claim her mood is better since placement of VNS as well.     REVIEW OF SYSTEMS: Full 14 system review of systems performed and notable only for those listed, all others are neg:  Constitutional: neg  Cardiovascular: Leg swelling  Ear/Nose/Throat: neg  Skin: neg Eyes: neg Respiratory: neg Gastroitestinal: neg  Hematology/Lymphatic: neg  Endocrine: Intolerance to heat and cold Musculoskeletal: Back pain  Allergy/Immunology: neg Neurological: Intractable seizures, memory loss Psychiatric: neg Sleep : neg   ALLERGIES: Allergies  Allergen Reactions  . Vicodin [Hydrocodone-Acetaminophen]  Nausea And Vomiting  . Morphine Rash  . Morphine And Related Rash    Allergic reaction only in iv form    HOME MEDICATIONS: Outpatient Prescriptions Prior to Visit  Medication Sig Dispense Refill  . Ascorbic Acid (VITAMIN C) 1000 MG tablet Take 1,000 mg by mouth daily.     . Calcium Carbonate-Vitamin D 600-400 MG-UNIT per tablet Take 1 tablet by mouth daily.     [name] DIAZEPAM RE Place 20 mg rectally. Rectally as needed for seizure lasting longer than 2 minutes    . FLUoxetine (PROZAC) 20 MG capsule Take 1 capsule (20 mg total) by mouth 2 (two) times daily. 60 capsule 2  . lacosamide (VIMPAT) 200 MG TABS tablet Take 1 tablet (200 mg total) by mouth 2 (two) times daily. 60 tablet 5  . lamoTRIgine (LAMICTAL) 100 MG tablet Take 250 mg by mouth every evening. Takes 2.5  Tablets =  250 mg  Every evening.    . lamoTRIgine (LAMICTAL) 25 MG tablet Take 225 mg by mouth every morning. Takes  225 mg  Every  Morning  =  9  Tablets  Of  25 mg.    . levETIRAcetam (KEPPRA) 1000 MG tablet Take 2 tablets (2,000 mg total) by mouth 2 (two) times daily. 120 tablet 6  . levothyroxine (SYNTHROID, LEVOTHROID) 50 MCG tablet Take 50 mcg by mouth daily before breakfast.    . promethazine (PHENERGAN) 25 MG tablet Take 25 mg by mouth every 6 (six) hours as needed. nausea    . warfarin (COUMADIN) 5 MG tablet Take 5 mg by mouth 2 (two) times daily.     No facility-administered medications prior to visit.  PAST MEDICAL HISTORY: Past Medical History  Diagnosis Date  . Anxiety   . Depression   . Seizures   . CVA (cerebrovascular accident due to intracerebral hemorrhage) 2004  . Arthritis   . MRSA (methicillin resistant Staphylococcus aureus)   . S/P BKA (below knee amputation) unilateral     Right   . Thyroid disease   . Cancer     Osteosarcoma, right leg  . 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 DVT (deep venous thrombosis)     Left leg    PAST SURGICAL HISTORY: Past Surgical History    Procedure Laterality Date  . Fracture surgery      Rt. femur, folllowed by MRSA  . Brain surgery  2004    cerebral aneurysm  . Total knee arthroplasty Right   . Above knee leg amputation Right     Osteosarcoma    FAMILY HISTORY: Family History  Problem Relation Age of Onset  . Anxiety disorder Mother   . Diabetes Father     SOCIAL HISTORY: History   Social History  . Marital Status: Legally Separated    Spouse Name: N/A  . Number of Children: 3  . Years of Education: GED   Occupational History  . Not on file.   Social History Main Topics  . Smoking status: Never Smoker   . Smokeless tobacco: Never Used     Comment: never used tobacco  . Alcohol Use: No  . Drug Use: No  . Sexual Activity: Not Currently   Other Topics Concern  . Not on file   Social History Narrative   Patient lives at home with mom.    Patient has GED.    Patient has 3 children.    Patient is married but in the process of divorced.            PHYSICAL EXAM  Filed Vitals:   07/15/14 1415  BP: 109/68  Pulse: 75  Height: 5\' 7"  (1.702 m)  Weight: 188 lb (85.276 kg)   Body mass index is 29.44 kg/(m^2). General: The patient is alert and cooperative at the time of the examination.  Skin: No significant peripheral edema is noted.  Neurologic Exam  Mental status: The patient is oriented x 3.  Cranial nerves: Facial symmetry is present. Speech is normal, no aphasia or dysarthria is noted. Extraocular movements are notable for restriction of superior gaze. On primary gaze, there is exotropia of the left eye. Visual fields are full.  Motor: The patient has good strength in all 4 extremities. (R above knee amp) Sensory examination: Soft touch sensation on the legs, arms, and face is symmetric  Coordination: The patient has good finger-nose-finger and heel-to-shin bilaterally.  Gait and station: The patient has a Right above-knee amputation, no prosthesis. The patient could not be  ambulated.  Reflexes: Deep tendon reflexes are symmetric in the arms, cannot get reflexes on the right leg secondary to above-knee amputation.  DIAGNOSTIC DATA (LABS, IMAGING, TESTING) - I reviewed patient records, labs, notes, testing and imaging myself where available.  Lab Results  Component Value Date   WBC 3.2* 07/04/2014   HGB 12.1 07/04/2014   HCT 37.8 07/04/2014   MCV 90.6 07/04/2014   PLT 181 07/04/2014      Component Value Date/Time   NA 143 07/04/2014 1006   NA 138 07/11/2013 1346   NA 139 05/05/2013 1217   NA 139 10/24/2012 1031   K 4.0 07/04/2014 1006   K 3.6 07/11/2013  1346   K 3.9 05/05/2013 1217   CL 97* 07/11/2013 1346   CL 99 05/05/2013 1217   CO2 29 07/04/2014 1006   CO2 31 07/11/2013 1346   CO2 23 02/22/2013 0455   GLUCOSE 83 07/04/2014 1006   GLUCOSE 126* 07/11/2013 1346   GLUCOSE 75 05/05/2013 1217   GLUCOSE 75 10/24/2012 1031   BUN 19.5 07/04/2014 1006   BUN 16 07/11/2013 1346   BUN 11 05/05/2013 1217   BUN 11 10/24/2012 1031   CREATININE 1.0 07/04/2014 1006   CREATININE 1.0 07/11/2013 1346   CREATININE 0.90 05/05/2013 1217   CALCIUM 9.7 07/04/2014 1006   CALCIUM 10.0 07/11/2013 1346   CALCIUM 8.8 02/22/2013 0455   CALCIUM 8.4 12/26/2009 1025   PROT 7.2 07/04/2014 1006   PROT 7.6 07/11/2013 1346   PROT 7.7 02/17/2013 0313   PROT 6.9 10/24/2012 1031   ALBUMIN 3.8 07/04/2014 1006   ALBUMIN 3.8 02/17/2013 0313   AST 23 07/04/2014 1006   AST 26 07/11/2013 1346   AST 26 02/17/2013 0313   ALT 25 07/04/2014 1006   ALT 27 07/11/2013 1346   ALT 33 02/17/2013 0313   ALKPHOS 96 07/04/2014 1006   ALKPHOS 102* 07/11/2013 1346   ALKPHOS 105 02/17/2013 0313   BILITOT 0.27 07/04/2014 1006   BILITOT 0.40 07/11/2013 1346   BILITOT 0.2* 02/17/2013 0313   GFRNONAA 75* 02/22/2013 0455   GFRAA 86* 02/22/2013 0455     ASSESSMENT AND PLAN  49 y.o. year old female  has a past medical history of Anxiety; Depression; Seizures; CVA (cerebrovascular  accident due to intracerebral hemorrhage) (2004);  MRSA (methicillin resistant Staphylococcus aureus); S/P BKA (below knee amputation) unilateral; Thyroid disease;  Unspecified epilepsy with intractable epilepsy (02/28/2013); and DVT (deep venous thrombosis). here to follow-up. Placement of vagal nerve stimulator 12/17/2013. The patient is a current patient of Dr. [name]  who is out of the office today . This note is sent to the work in doctor.     Continue Keppra thousand milligrams 2 tabs twice daily will renew for 3 month Rx Continue Lamictal 225 mg twice daily (has 25mg  tabs) Continued Vimpat 200 twice daily will renew for 3 month Rx Follow-up in 6 months Cassandra Andrade, [place], [facility], [facility] [address], [place], [place] [address] [phone]

## 2014-07-15 NOTE — Patient Instructions (Signed)
Continue Keppra thousand milligrams 2 tabs twice daily will renew for 3 month Rx Continue Lamictal 225 mg twice daily Continued Vimpat 200 twice daily will renew for 3 month Rx Follow-up in 6 months

## 2014-07-16 ENCOUNTER — Ambulatory Visit: Payer: Medicare Other | Admitting: Physical Therapy

## 2014-07-16 ENCOUNTER — Encounter: Payer: Self-pay | Admitting: Physical Therapy

## 2014-07-16 DIAGNOSIS — R269 Unspecified abnormalities of gait and mobility: Secondary | ICD-10-CM

## 2014-07-16 DIAGNOSIS — Z89612 Acquired absence of left leg above knee: Secondary | ICD-10-CM

## 2014-07-16 DIAGNOSIS — R531 Weakness: Secondary | ICD-10-CM

## 2014-07-16 DIAGNOSIS — R2689 Other abnormalities of gait and mobility: Secondary | ICD-10-CM

## 2014-07-16 DIAGNOSIS — R6889 Other general symptoms and signs: Secondary | ICD-10-CM

## 2014-07-16 DIAGNOSIS — R29818 Other symptoms and signs involving the nervous system: Secondary | ICD-10-CM | POA: Diagnosis not present

## 2014-07-16 NOTE — Therapy (Signed)
[facility] [address] [place], [place], [address] Phone: [phone]   Fax:  [phone]  Physical Therapy Treatment  Patient Details  Name: Cassandra Andrade MRN: [id number] Date of Birth: 31-Mar-1965 Referring Provider:  Fran Lowes, MD  Encounter Date: 07/16/2014      PT End of Session - 07/16/14 1534    Visit Number 15   Number of Visits 17   Date for PT Re-Evaluation 07/12/14   PT Start Time 1534   PT Stop Time 1630   PT Time Calculation (min) 56 min   Equipment Utilized During Treatment Gait belt   Activity Tolerance Patient tolerated treatment well   Behavior During Therapy [facility] for tasks assessed/performed      Past Medical History  Diagnosis Date  . Anxiety   . Depression   . Seizures   . CVA (cerebrovascular accident due to intracerebral hemorrhage) 2004  . Arthritis   . MRSA (methicillin resistant Staphylococcus aureus)   . S/P BKA (below knee amputation) unilateral     Right   . Thyroid disease   . Cancer     Osteosarcoma, right leg  . 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 DVT (deep venous thrombosis)     Left leg    Past Surgical History  Procedure Laterality Date  . Fracture surgery      Rt. femur, folllowed by MRSA  . Brain surgery  2004    cerebral aneurysm  . Total knee arthroplasty Right   . Above knee leg amputation Right     Osteosarcoma    There were no vitals filed for this visit.  Visit Diagnosis:  Abnormality of gait  Generalized weakness  Decreased functional activity tolerance  Balance problems  Status post above knee amputation of left lower extremity      Subjective Assessment - 07/16/14 1646    Subjective Pt reports having a small seizure on the way to PT session today but able to stop with watch. Reports no issues over the weekend walking around the home and out to the car with CNA and wheelchair not following behind. Mom reports patient  still chooses days where she does not want to wear the leg all day.      Pt reports no pain this date.   AMBULATION: Pt ambulated 115' with RW and SBA. Required verbal cues for step width to decreased ADD gait on L. Placed colored marked on RW for constant visual cue of step width and foot placement.  Pt ambulated 39' x2 with rollator and CGA. Between bouts, had to stop and sit on rollator due to experiencing a small seizure. Was able to stop the seizure with watch. Pt maintained balance and was able to turn and sit on the rollator seat during this episode.  Pt ambulated 443' x1 on outdoor concrete surface, up and down ramp and curb and into the car with rollator and CGA of 1. Required verbal cues for step width and keeping the rollator close to her body to maintain upright posture and have better toe clearance on the R.                             PT Education - 07/16/14 1534    Education provided Yes   Education Details Discussed importance of wearing the prosthesis during all awake hours to increase frequency of use and availability of '2 feet' for safety. Demonstrated use  of rollator on unlevel grass surface outdoors.    Person(s) Educated Patient;Parent(s)   Methods Explanation;Demonstration;Verbal cues   Comprehension Verbalized understanding;Returned demonstration;Verbal cues required;Need further instruction          PT Short Term Goals - 06/12/14 1315    PT SHORT TERM GOAL #1   Title tolerates wear of prosthesis >50% of awake hours without any skin issues or discomfort (Target Date: 06/14/2014)   Baseline NOT MET 06/12/14   Time 4   Period Weeks   Status Not Met   PT SHORT TERM GOAL #2   Title donnes prosthesis correctly with mother cueing / supervising (Target Date: 06/14/2014)   Baseline MET 06/10/14    Time 4   Period Weeks   Status Achieved   PT SHORT TERM GOAL #3   Title stands with rolling walker and prosthesis managing clothes for toileting with  supervision. (Target Date: 06/14/2014)   Baseline MET 06/12/14   Time 4   Period Weeks   Status Achieved   PT SHORT TERM GOAL #4   Title ambulates 125' with rolling walker & prosthesis with supervision. (Target Date: 06/14/2014)   Baseline MET 06/10/14   Time 4   Period Weeks   Status Achieved           PT Long Term Goals - 07/16/14 1530    PT LONG TERM GOAL #1   Title tolerates wear of prosthesis >75% of awake hours without change in skin integrity or tenderness. (NEW Target Date: 08/09/2014)   Baseline Partial MET 07/12/2014 Patient is inconsistent with wear from ~25% to 90% of her awake hours.   Time 8   Period Weeks   Status On-going   PT LONG TERM GOAL #2   Title donnes prosthesis modified independent and mother verbalizes proper prosthetic care. (Target Date: 07/12/2014)   Baseline MET 07/12/2014   Time 8   Period Weeks   Status Achieved   PT LONG TERM GOAL #3   Title ambulates 250' with rolling walker & prosthesis with supervision.  (NEW Target Date: 08/09/2014)   Baseline Partial MET Patient ambulates up to 250' with standard RW & prosthesis with minA.   Time 8   Period Weeks   Status On-going   PT LONG TERM GOAL #4   Title negotiates ramps & curbs with rolling walker and stairs with 2 hands on 1 rail with supervision.  (NEW Target Date: 08/09/2014)   Baseline Partial ME 07/12/2014 Patient negotiates ramp & curb with RW with minA.   Time 8   Period Weeks   Status On-going   PT LONG TERM GOAL #5   Title standing balance with rolling walker & prosthesis reaching 10" anteriorly, across midline and to floor with supervision. (NEW Target Date: 08/09/2014)   Baseline Partial MET 07/12/2014 Pt stands with RW & reaches 8" anteriorly, across midline and within 4" of floor with CGA.    Time 4   Period Weeks   Status On-going   Additional Long Term Goals   Additional Long Term Goals Yes   PT LONG TERM GOAL #6   Title Mother reports safely assisting patient with ambulating to access  community with RW & prosthesis.  (NEW Target Date: 08/09/2014)   Time 4   Period Weeks   Status New               Plan - 07/16/14 1534    Clinical Impression Statement Pt had another seizure during ambulation today but was able to sit  down on rollator and correct with watch. Stated shefelt fine and mom agreed to proceed with PT session. Pt's balance is improving with rollator causing her to become more efficient with gait.    Pt will benefit from skilled therapeutic intervention in order to improve on the following deficits Abnormal gait;Decreased activity tolerance;Decreased cognition;Decreased endurance;Decreased knowledge of precautions;Decreased knowledge of use of DME;Decreased mobility;Decreased range of motion   Rehab Potential Good   Clinical Impairments Affecting Rehab Potential cognition   PT Frequency 1x / week   PT Duration 4 weeks   PT Treatment/Interventions ADLs/Self Care Home Management;DME Instruction;Gait training;Stair training;Functional mobility training;Therapeutic activities;Therapeutic exercise;Balance training;Neuromuscular re-education;Patient/family education;Other (comment)   PT Next Visit Plan work towards [place], gait and dynamic balance with prosthesis, walking with rollator, negotiating ramps and curbs with rollator. Assess LTGs.    Consulted and Agree with Plan of Care Patient;Family member/caregiver   Family Member Consulted mother        Problem List Patient Active Problem List   Diagnosis Date Noted  . Abnormal finding on mammography 05/18/2014  . Major depression, recurrent 03/19/2014  . Vision disturbance 02/28/2014  . Postprocedural state 12/31/2013  . Lupus anticoagulant disorder 12/20/2013  . Back pain, chronic 11/07/2013  . Absence of bladder continence 08/05/2013  . History of anticoagulant therapy 07/17/2013  . Long term current use of anticoagulant therapy 07/17/2013  . Epilepsy with intractable epilepsy 02/28/2013  . Absolute  anemia 02/28/2013  . Seizures 02/21/2013  . Seizure 02/17/2013  . DVT, bilateral lower limbs 11/07/2012  . Hypothyroid 07/04/2012  . Seizure disorder 07/04/2012  . Chronic pain syndrome 07/04/2012  . S/P BKA (below knee amputation) 07/04/2012  . Chronic pain associated with significant psychosocial dysfunction 07/04/2012  . Cerebrovascular accident, late effects 06/05/2012  . Status post above knee amputation 06/08/2011  . Mechanical complication of graft of bone, cartilage, muscle or tendon 05/10/2011  . Depression 05/03/2011  . 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 [place], [place], [address] Phone: [phone]   Fax:  [phone]

## 2014-07-16 NOTE — Progress Notes (Signed)
I reviewed note and agree with plan.   Penni Bombard, MD 1/51/8343, 7:35 AM Certified in Neurology, Neurophysiology and Neuroimaging  [facility] [address] [place],  [address] [phone]

## 2014-07-18 ENCOUNTER — Encounter: Payer: Self-pay | Admitting: Physical Therapy

## 2014-07-18 ENCOUNTER — Ambulatory Visit: Payer: Medicare Other | Admitting: Physical Therapy

## 2014-07-18 DIAGNOSIS — R269 Unspecified abnormalities of gait and mobility: Secondary | ICD-10-CM

## 2014-07-18 DIAGNOSIS — R6889 Other general symptoms and signs: Secondary | ICD-10-CM

## 2014-07-18 DIAGNOSIS — R29818 Other symptoms and signs involving the nervous system: Secondary | ICD-10-CM | POA: Diagnosis not present

## 2014-07-18 DIAGNOSIS — R2689 Other abnormalities of gait and mobility: Secondary | ICD-10-CM

## 2014-07-18 DIAGNOSIS — R531 Weakness: Secondary | ICD-10-CM

## 2014-07-18 DIAGNOSIS — Z89612 Acquired absence of left leg above knee: Secondary | ICD-10-CM

## 2014-07-19 NOTE — Therapy (Signed)
[facility] [address] [place], [place], [address] Phone: [phone]   Fax:  [phone]  Physical Therapy Treatment  Patient Details  Name: Cassandra Andrade MRN: [id number] Date of Birth: [birth year] Referring Provider:  Carolann Littler, MD  Encounter Date: 07/18/2014      PT End of Session - 07/18/14 1445    Visit Number 16   Number of Visits 22   Date for PT Re-Evaluation 08/09/14   PT Start Time 1445   PT Stop Time 1528   PT Time Calculation (min) 43 min   Equipment Utilized During Treatment Gait belt   Activity Tolerance Patient tolerated treatment well   Behavior During Therapy [facility] for tasks assessed/performed      Past Medical History  Diagnosis Date  . Anxiety   . Depression   . Seizures   . CVA (cerebrovascular accident due to intracerebral hemorrhage) 2004  . Arthritis   . MRSA (methicillin resistant Staphylococcus aureus)   . S/P BKA (below knee amputation) unilateral     Right   . Thyroid disease   . Cancer     Osteosarcoma, right leg  . 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 DVT (deep venous thrombosis)     Left leg    Past Surgical History  Procedure Laterality Date  . Fracture surgery      Rt. femur, folllowed by MRSA  . Brain surgery  2004    cerebral aneurysm  . Total knee arthroplasty Right   . Above knee leg amputation Right     Osteosarcoma    There were no vitals filed for this visit.  Visit Diagnosis:  Abnormality of gait  Generalized weakness  Decreased functional activity tolerance  Balance problems  Status post above knee amputation of left lower extremity      Subjective Assessment - 07/18/14 1445    Subjective She went to cousin's graduation and her back bothered her sitting for long period.   Patient is accompained by: Family member   Currently in Pain? No/denies                         [facility] Adult PT Treatment/Exercise  - 07/18/14 1445    Transfers   Transfers Sit to Stand;Stand to Sit   Sit to Stand 5: Supervision;With upper extremity assist;From chair/3-in-1  from rollator RW   Sit to Stand Details (indicate cue type and reason) cues on rollator control and prosthetic knee   Stand to Sit 5: Supervision;With upper extremity assist;To chair/3-in-1  to rollator   Stand to Sit Details cues on rollator management & prosthetic knee   Ambulation/Gait   Ambulation/Gait Yes   Ambulation/Gait Assistance 5: Supervision   Ambulation/Gait Assistance Details tactile cues on pelvic orientation, posture & step length   Ambulation Distance (Feet) 300 Feet  300' X 2   Assistive device Prosthesis;Rolling walker   Gait Pattern Decreased stance time - right;Decreased step length - left;Trunk rotated posteriorly on right;Step-through pattern   Ambulation Surface Indoor;Level;Outdoor;Paved   Stairs --   Ramp 5: Supervision  rollator walker & prosthesis   Curb 5: Supervision  rollator walker & prosthesis   Wheelchair Mobility   Wheelchair Mobility --   Prosthetics   Prosthetic Care Comments  --   Current prosthetic wear tolerance (days/week)  --   Current prosthetic wear tolerance (#hours/day)  all awake hours   Current prosthetic weight-bearing tolerance (hours/day)  tolerating full WB  Residual limb condition  no open areas                  PT Short Term Goals - 06/12/14 1315    PT SHORT TERM GOAL #1   Title tolerates wear of prosthesis >50% of awake hours without any skin issues or discomfort (Target Date: 06/14/2014)   Baseline NOT MET 06/12/14   Time 4   Period Weeks   Status Not Met   PT SHORT TERM GOAL #2   Title donnes prosthesis correctly with mother cueing / supervising (Target Date: 06/14/2014)   Baseline MET 06/10/14    Time 4   Period Weeks   Status Achieved   PT SHORT TERM GOAL #3   Title stands with rolling walker and prosthesis managing clothes for toileting with supervision. (Target  Date: 06/14/2014)   Baseline MET 06/12/14   Time 4   Period Weeks   Status Achieved   PT SHORT TERM GOAL #4   Title ambulates 125' with rolling walker & prosthesis with supervision. (Target Date: 06/14/2014)   Baseline MET 06/10/14   Time 4   Period Weeks   Status Achieved           PT Long Term Goals - 07/16/14 1530    PT LONG TERM GOAL #1   Title tolerates wear of prosthesis >75% of awake hours without change in skin integrity or tenderness. (NEW Target Date: 08/09/2014)   Baseline Partial MET 07/12/2014 Patient is inconsistent with wear from ~25% to 90% of her awake hours.   Time 8   Period Weeks   Status On-going   PT LONG TERM GOAL #2   Title donnes prosthesis modified independent and mother verbalizes proper prosthetic care. (Target Date: 07/12/2014)   Baseline MET 07/12/2014   Time 8   Period Weeks   Status Achieved   PT LONG TERM GOAL #3   Title ambulates 250' with rolling walker & prosthesis with supervision.  (NEW Target Date: 08/09/2014)   Baseline Partial MET Patient ambulates up to 250' with standard RW & prosthesis with minA.   Time 8   Period Weeks   Status On-going   PT LONG TERM GOAL #4   Title negotiates ramps & curbs with rolling walker and stairs with 2 hands on 1 rail with supervision.  (NEW Target Date: 08/09/2014)   Baseline Partial ME 07/12/2014 Patient negotiates ramp & curb with RW with minA.   Time 8   Period Weeks   Status On-going   PT LONG TERM GOAL #5   Title standing balance with rolling walker & prosthesis reaching 10" anteriorly, across midline and to floor with supervision. (NEW Target Date: 08/09/2014)   Baseline Partial MET 07/12/2014 Pt stands with RW & reaches 8" anteriorly, across midline and within 4" of floor with CGA.    Time 4   Period Weeks   Status On-going   Additional Long Term Goals   Additional Long Term Goals Yes   PT LONG TERM GOAL #6   Title Mother reports safely assisting patient with ambulating to access community with RW &  prosthesis.  (NEW Target Date: 08/09/2014)   Time 4   Period Weeks   Status New               Plan - 07/18/14 1445    Clinical Impression Statement Patient is improving with long term functional goal for her to ambulate to access community to decrease burden of care on her aging mother who  has to lift the w/c in/ out of car. She appears that if she attempted to ambulate without supervision with RW she is less likely to fall.   Pt will benefit from skilled therapeutic intervention in order to improve on the following deficits Abnormal gait;Decreased activity tolerance;Decreased cognition;Decreased endurance;Decreased knowledge of precautions;Decreased knowledge of use of DME;Decreased mobility;Decreased range of motion   Rehab Potential Good   Clinical Impairments Affecting Rehab Potential cognition   PT Frequency 1x / week   PT Duration 4 weeks   PT Treatment/Interventions ADLs/Self Care Home Management;DME Instruction;Gait training;Stair training;Functional mobility training;Therapeutic activities;Therapeutic exercise;Balance training;Neuromuscular re-education;Patient/family education;Other (comment);Prosthetic Training   PT Next Visit Plan work towards LTGs, gait and dynamic balance with prosthesis, walking with rollator, negotiating ramps and curbs with rollator.    Consulted and Agree with Plan of Care Patient;Family member/caregiver   Family Member Consulted mother        Problem List Patient Active Problem List   Diagnosis Date Noted  . Abnormal finding on mammography 05/18/2014  . Major depression, recurrent 03/19/2014  . Vision disturbance 02/28/2014  . Postprocedural state 12/31/2013  . Lupus anticoagulant disorder 12/20/2013  . Back pain, chronic 11/07/2013  . Absence of bladder continence 08/05/2013  . History of anticoagulant therapy 07/17/2013  . Long term current use of anticoagulant therapy 07/17/2013  . Epilepsy with intractable epilepsy 02/28/2013  .  Absolute anemia 02/28/2013  . Seizures 02/21/2013  . Seizure 02/17/2013  . DVT, bilateral lower limbs 11/07/2012  . Hypothyroid 07/04/2012  . Seizure disorder 07/04/2012  . Chronic pain syndrome 07/04/2012  . S/P BKA (below knee amputation) 07/04/2012  . Chronic pain associated with significant psychosocial dysfunction 07/04/2012  . Cerebrovascular accident, late effects 06/05/2012  . Status post above knee amputation 06/08/2011  . Mechanical complication of graft of bone, cartilage, muscle or tendon 05/10/2011  . Depression 05/03/2011  . 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 [place], [place], [address] Phone: [phone]   Fax:  [phone]

## 2014-07-31 ENCOUNTER — Ambulatory Visit: Payer: Medicare Other | Attending: Family | Admitting: Physical Therapy

## 2014-07-31 ENCOUNTER — Encounter: Payer: Self-pay | Admitting: Physical Therapy

## 2014-07-31 DIAGNOSIS — R6889 Other general symptoms and signs: Secondary | ICD-10-CM

## 2014-07-31 DIAGNOSIS — R269 Unspecified abnormalities of gait and mobility: Secondary | ICD-10-CM | POA: Diagnosis present

## 2014-07-31 DIAGNOSIS — Z89612 Acquired absence of left leg above knee: Secondary | ICD-10-CM | POA: Diagnosis present

## 2014-07-31 DIAGNOSIS — R29818 Other symptoms and signs involving the nervous system: Secondary | ICD-10-CM | POA: Insufficient documentation

## 2014-07-31 DIAGNOSIS — R531 Weakness: Secondary | ICD-10-CM | POA: Diagnosis present

## 2014-07-31 DIAGNOSIS — R2689 Other abnormalities of gait and mobility: Secondary | ICD-10-CM

## 2014-07-31 NOTE — Therapy (Signed)
[facility] [address] [place], [place], [address] Phone: [phone]   Fax:  [phone]  Physical Therapy Treatment  Patient Details  Name: Cassandra Andrade MRN: [id number] Date of Birth: 1965/08/19 Referring Provider:  Lizbeth Bark, NP  Encounter Date: 07/31/2014      PT End of Session - 07/31/14 1530    Visit Number 17   Number of Visits 22   Date for PT Re-Evaluation 08/09/14   PT Start Time 1532   PT Stop Time 1618   PT Time Calculation (min) 46 min   Equipment Utilized During Treatment Gait belt   Activity Tolerance Patient tolerated treatment well   Behavior During Therapy [facility] for tasks assessed/performed      Past Medical History  Diagnosis Date  . Anxiety   . Depression   . Seizures   . CVA (cerebrovascular accident due to intracerebral hemorrhage) 2004  . Arthritis   . MRSA (methicillin resistant Staphylococcus aureus)   . S/P BKA (below knee amputation) unilateral     Right   . Thyroid disease   . Cancer     Osteosarcoma, right leg  . 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 DVT (deep venous thrombosis)     Left leg    Past Surgical History  Procedure Laterality Date  . Fracture surgery      Rt. femur, folllowed by MRSA  . Brain surgery  2004    cerebral aneurysm  . Total knee arthroplasty Right   . Above knee leg amputation Right     Osteosarcoma    There were no vitals filed for this visit.  Visit Diagnosis:  Abnormality of gait  Generalized weakness  Decreased functional activity tolerance  Balance problems  Status post above knee amputation of left lower extremity      Subjective Assessment - 07/31/14 1538    Subjective No falls to floor but sat down on toilet hard one time so buttocks is bruised.   Currently in Pain? No/denies                         [facility] Adult PT Treatment/Exercise - 07/31/14 1530    Transfers   Transfers  Sit to Stand;Stand to Sit   Sit to Stand 5: Supervision;With upper extremity assist;From chair/3-in-1;From toilet  to rollator RW   Sit to Stand Details (indicate cue type and reason) PT demo technique for toilet to control descent   Stand to Sit 5: Supervision;With upper extremity assist;To chair/3-in-1;To toilet  from rollator   Stand to Sit Details cues on technique from toilet   Ambulation/Gait   Ambulation/Gait Yes   Ambulation/Gait Assistance 5: Supervision   Ambulation/Gait Assistance Details recommend standard RW in home & rollator in community   Ambulation Distance (Feet) 300 Feet  300' X 1, 150' X 2   Assistive device Prosthesis;Rolling walker;Rollator   Gait Pattern Decreased stance time - right;Decreased step length - left;Trunk rotated posteriorly on right;Step-through pattern   Ambulation Surface Indoor;Level   Ramp 5: Supervision  rollator walker & prosthesis   Ramp Details (indicate cue type and reason) cues on technique to manage rollator   Curb 5: Supervision  rollator walker & prosthesis   Curb Details (indicate cue type and reason) cues on technique to manage rollator   Prosthetics   Current prosthetic wear tolerance (#hours/day)  all awake hours   Current prosthetic weight-bearing tolerance (hours/day)  tolerating full WB  Residual limb condition  no open areas                PT Education - 07/31/14 1530    Education provided Yes   Education Details sitting patient on rollator seat in event of seizure, moving rollator into position to sit down vs patient turning to sit on rollator   Person(s) Educated Patient;Parent(s)   Methods Explanation;Demonstration;Tactile cues;Verbal cues   Comprehension Verbalized understanding;Returned demonstration;Verbal cues required;Tactile cues required;Need further instruction          PT Short Term Goals - 06/12/14 1315    PT SHORT TERM GOAL #1   Title tolerates wear of prosthesis >50% of awake hours without any  skin issues or discomfort (Target Date: 06/14/2014)   Baseline NOT MET 06/12/14   Time 4   Period Weeks   Status Not Met   PT SHORT TERM GOAL #2   Title donnes prosthesis correctly with mother cueing / supervising (Target Date: 06/14/2014)   Baseline MET 06/10/14    Time 4   Period Weeks   Status Achieved   PT SHORT TERM GOAL #3   Title stands with rolling walker and prosthesis managing clothes for toileting with supervision. (Target Date: 06/14/2014)   Baseline MET 06/12/14   Time 4   Period Weeks   Status Achieved   PT SHORT TERM GOAL #4   Title ambulates 125' with rolling walker & prosthesis with supervision. (Target Date: 06/14/2014)   Baseline MET 06/10/14   Time 4   Period Weeks   Status Achieved           PT Long Term Goals - 07/16/14 1530    PT LONG TERM GOAL #1   Title tolerates wear of prosthesis >75% of awake hours without change in skin integrity or tenderness. (NEW Target Date: 08/09/2014)   Baseline Partial MET 07/12/2014 Patient is inconsistent with wear from ~25% to 90% of her awake hours.   Time 8   Period Weeks   Status On-going   PT LONG TERM GOAL #2   Title donnes prosthesis modified independent and mother verbalizes proper prosthetic care. (Target Date: 07/12/2014)   Baseline MET 07/12/2014   Time 8   Period Weeks   Status Achieved   PT LONG TERM GOAL #3   Title ambulates 250' with rolling walker & prosthesis with supervision.  (NEW Target Date: 08/09/2014)   Baseline Partial MET Patient ambulates up to 250' with standard RW & prosthesis with minA.   Time 8   Period Weeks   Status On-going   PT LONG TERM GOAL #4   Title negotiates ramps & curbs with rolling walker and stairs with 2 hands on 1 rail with supervision.  (NEW Target Date: 08/09/2014)   Baseline Partial ME 07/12/2014 Patient negotiates ramp & curb with RW with minA.   Time 8   Period Weeks   Status On-going   PT LONG TERM GOAL #5   Title standing balance with rolling walker & prosthesis reaching  10" anteriorly, across midline and to floor with supervision. (NEW Target Date: 08/09/2014)   Baseline Partial MET 07/12/2014 Pt stands with RW & reaches 8" anteriorly, across midline and within 4" of floor with CGA.    Time 4   Period Weeks   Status On-going   Additional Long Term Goals   Additional Long Term Goals Yes   PT LONG TERM GOAL #6   Title Mother reports safely assisting patient with ambulating to access community with RW &  prosthesis.  (NEW Target Date: 08/09/2014)   Time 4   Period Weeks   Status New               Plan - 07/31/14 1530    Clinical Impression Statement Patient improved control with sitting on toilet. Patient's mother appears to undertand technique to negotiate ramp & curb with rollator.   Pt will benefit from skilled therapeutic intervention in order to improve on the following deficits Abnormal gait;Decreased activity tolerance;Decreased cognition;Decreased endurance;Decreased knowledge of precautions;Decreased knowledge of use of DME;Decreased mobility;Decreased range of motion   Rehab Potential Good   Clinical Impairments Affecting Rehab Potential cognition   PT Frequency 1x / week   PT Duration 4 weeks   PT Treatment/Interventions ADLs/Self Care Home Management;DME Instruction;Gait training;Stair training;Functional mobility training;Therapeutic activities;Therapeutic exercise;Balance training;Neuromuscular re-education;Patient/family education;Other (comment);Prosthetic Training   PT Next Visit Plan begin to assess LTGs, gait and dynamic balance with prosthesis, walking with rollator, negotiating ramps and curbs with rollator.    Consulted and Agree with Plan of Care Patient;Family member/caregiver   Family Member Consulted mother        Problem List Patient Active Problem List   Diagnosis Date Noted  . Abnormal finding on mammography 05/18/2014  . Major depression, recurrent 03/19/2014  . Vision disturbance 02/28/2014  . Postprocedural state  12/31/2013  . Lupus anticoagulant disorder 12/20/2013  . Back pain, chronic 11/07/2013  . Absence of bladder continence 08/05/2013  . History of anticoagulant therapy 07/17/2013  . Long term current use of anticoagulant therapy 07/17/2013  . Epilepsy with intractable epilepsy 02/28/2013  . Absolute anemia 02/28/2013  . Seizures 02/21/2013  . Seizure 02/17/2013  . DVT, bilateral lower limbs 11/07/2012  . Hypothyroid 07/04/2012  . Seizure disorder 07/04/2012  . Chronic pain syndrome 07/04/2012  . S/P BKA (below knee amputation) 07/04/2012  . Chronic pain associated with significant psychosocial dysfunction 07/04/2012  . Cerebrovascular accident, late effects 06/05/2012  . Status post above knee amputation 06/08/2011  . Mechanical complication of graft of bone, cartilage, muscle or tendon 05/10/2011  . Depression 05/03/2011  . 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

## 2014-08-05 ENCOUNTER — Encounter: Payer: Self-pay | Admitting: Physical Therapy

## 2014-08-05 ENCOUNTER — Ambulatory Visit: Payer: Medicare Other | Admitting: Physical Therapy

## 2014-08-05 DIAGNOSIS — R6889 Other general symptoms and signs: Secondary | ICD-10-CM

## 2014-08-05 DIAGNOSIS — R531 Weakness: Secondary | ICD-10-CM

## 2014-08-05 DIAGNOSIS — R269 Unspecified abnormalities of gait and mobility: Secondary | ICD-10-CM | POA: Diagnosis not present

## 2014-08-05 DIAGNOSIS — Z89612 Acquired absence of left leg above knee: Secondary | ICD-10-CM

## 2014-08-05 DIAGNOSIS — R2689 Other abnormalities of gait and mobility: Secondary | ICD-10-CM

## 2014-08-05 NOTE — Therapy (Signed)
[facility] [address] [place], [place], [address] Phone: [phone]   Fax:  [phone]  Physical Therapy Treatment  Patient Details  Name: Cassandra Andrade MRN: [id number] Date of Birth: 1966/02/07 Referring Provider:  Junie Panning, NP  Encounter Date: 08/05/2014      PT End of Session - 08/05/14 1445    Visit Number 18   Number of Visits 22   Date for PT Re-Evaluation 08/09/14   PT Start Time 1448   PT Stop Time 1533   PT Time Calculation (min) 45 min   Equipment Utilized During Treatment Gait belt   Activity Tolerance Patient tolerated treatment well   Behavior During Therapy [facility] for tasks assessed/performed      Past Medical History  Diagnosis Date  . Anxiety   . Depression   . Seizures   . CVA (cerebrovascular accident due to intracerebral hemorrhage) 2004  . Arthritis   . MRSA (methicillin resistant Staphylococcus aureus)   . S/P BKA (below knee amputation) unilateral     Right   . Thyroid disease   . Cancer     Osteosarcoma, right leg  . 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 DVT (deep venous thrombosis)     Left leg    Past Surgical History  Procedure Laterality Date  . Fracture surgery      Rt. femur, folllowed by MRSA  . Brain surgery  2004    cerebral aneurysm  . Total knee arthroplasty Right   . Above knee leg amputation Right     Osteosarcoma    There were no vitals filed for this visit.  Visit Diagnosis:  Abnormality of gait  Generalized weakness  Decreased functional activity tolerance  Balance problems  Status post above knee amputation of left lower extremity      Subjective Assessment - 08/05/14 1448    Subjective No falls. She is using walker to get around more. She had an issue when distracted by TV show while ambulating   Currently in Pain? No/denies                         [facility] Adult PT Treatment/Exercise - 08/05/14  1445    Transfers   Transfers Sit to Stand;Stand to Sit   Sit to Stand 5: Supervision;With upper extremity assist;From chair/3-in-1;From toilet  to rollator RW, chair without armrests using UEs   Sit to Stand Details (indicate cue type and reason) cues on technique from chair without armrests, foot position to unlock prosthesis   Stand to Sit 5: Supervision;With upper extremity assist;To chair/3-in-1;To toilet  from rollator, to chair without armrests with UEs   Stand to Sit Details cues on technique to chair without armrests with UEs   Ambulation/Gait   Ambulation/Gait Yes   Ambulation/Gait Assistance 5: Supervision   Ambulation/Gait Assistance Details unlocking & locking prosthetic knee, moving rollator vs pt turning 180* to sit down or stand up to ambulate   Ambulation Distance (Feet) 300 Feet  300' X 1, 100' X 2, 25' X 4    Assistive device Prosthesis;Rolling walker;Rollator   Gait Pattern Decreased stance time - right;Decreased step length - left;Trunk rotated posteriorly on right;Step-through pattern   Ambulation Surface Indoor;Level   Ramp 5: Supervision  rollator walker & prosthesis   Curb 5: Supervision  rollator walker & prosthesis   Prosthetics   Current prosthetic wear tolerance (#hours/day)  all awake hours   Current prosthetic weight-bearing  tolerance (hours/day)  tolerating full WB   Residual limb condition  no open areas                  PT Short Term Goals - 06/12/14 1315    PT SHORT TERM GOAL #1   Title tolerates wear of prosthesis >50% of awake hours without any skin issues or discomfort (Target Date: 06/14/2014)   Baseline NOT MET 06/12/14   Time 4   Period Weeks   Status Not Met   PT SHORT TERM GOAL #2   Title donnes prosthesis correctly with mother cueing / supervising (Target Date: 06/14/2014)   Baseline MET 06/10/14    Time 4   Period Weeks   Status Achieved   PT SHORT TERM GOAL #3   Title stands with rolling walker and prosthesis managing  clothes for toileting with supervision. (Target Date: 06/14/2014)   Baseline MET 06/12/14   Time 4   Period Weeks   Status Achieved   PT SHORT TERM GOAL #4   Title ambulates 125' with rolling walker & prosthesis with supervision. (Target Date: 06/14/2014)   Baseline MET 06/10/14   Time 4   Period Weeks   Status Achieved           PT Long Term Goals - 07/16/14 1530    PT LONG TERM GOAL #1   Title tolerates wear of prosthesis >75% of awake hours without change in skin integrity or tenderness. (NEW Target Date: 08/09/2014)   Baseline Partial MET 07/12/2014 Patient is inconsistent with wear from ~25% to 90% of her awake hours.   Time 8   Period Weeks   Status On-going   PT LONG TERM GOAL #2   Title donnes prosthesis modified independent and mother verbalizes proper prosthetic care. (Target Date: 07/12/2014)   Baseline MET 07/12/2014   Time 8   Period Weeks   Status Achieved   PT LONG TERM GOAL #3   Title ambulates 250' with rolling walker & prosthesis with supervision.  (NEW Target Date: 08/09/2014)   Baseline Partial MET Patient ambulates up to 250' with standard RW & prosthesis with minA.   Time 8   Period Weeks   Status On-going   PT LONG TERM GOAL #4   Title negotiates ramps & curbs with rolling walker and stairs with 2 hands on 1 rail with supervision.  (NEW Target Date: 08/09/2014)   Baseline Partial ME 07/12/2014 Patient negotiates ramp & curb with RW with minA.   Time 8   Period Weeks   Status On-going   PT LONG TERM GOAL #5   Title standing balance with rolling walker & prosthesis reaching 10" anteriorly, across midline and to floor with supervision. (NEW Target Date: 08/09/2014)   Baseline Partial MET 07/12/2014 Pt stands with RW & reaches 8" anteriorly, across midline and within 4" of floor with CGA.    Time 4   Period Weeks   Status On-going   Additional Long Term Goals   Additional Long Term Goals Yes   PT LONG TERM GOAL #6   Title Mother reports safely assisting  patient with ambulating to access community with RW & prosthesis.  (NEW Target Date: 08/09/2014)   Time 4   Period Weeks   Status New               Plan - 08/05/14 1445    Clinical Impression Statement Patient is ambulating better with rollator walker. She is using walker to access the community now. Patient  is ready for discharge.   Pt will benefit from skilled therapeutic intervention in order to improve on the following deficits Abnormal gait;Decreased activity tolerance;Decreased cognition;Decreased endurance;Decreased knowledge of precautions;Decreased knowledge of use of DME;Decreased mobility;Decreased range of motion   Rehab Potential Good   Clinical Impairments Affecting Rehab Potential cognition   PT Frequency 1x / week   PT Duration 4 weeks   PT Treatment/Interventions ADLs/Self Care Home Management;DME Instruction;Gait training;Stair training;Functional mobility training;Therapeutic activities;Therapeutic exercise;Balance training;Neuromuscular re-education;Patient/family education;Other (comment);Prosthetic Training   PT Next Visit Plan Assess LTGs & discharge   Consulted and Agree with Plan of Care Patient;Family member/caregiver   Family Member Consulted mother        Problem List Patient Active Problem List   Diagnosis Date Noted  . Abnormal finding on mammography 05/18/2014  . Major depression, recurrent 03/19/2014  . Vision disturbance 02/28/2014  . Postprocedural state 12/31/2013  . Lupus anticoagulant disorder 12/20/2013  . Back pain, chronic 11/07/2013  . Absence of bladder continence 08/05/2013  . History of anticoagulant therapy 07/17/2013  . Long term current use of anticoagulant therapy 07/17/2013  . Epilepsy with intractable epilepsy 02/28/2013  . Absolute anemia 02/28/2013  . Seizures 02/21/2013  . Seizure 02/17/2013  . DVT, bilateral lower limbs 11/07/2012  . Hypothyroid 07/04/2012  . Seizure disorder 07/04/2012  . Chronic pain syndrome  07/04/2012  . S/P BKA (below knee amputation) 07/04/2012  . Chronic pain associated with significant psychosocial dysfunction 07/04/2012  . Cerebrovascular accident, late effects 06/05/2012  . Status post above knee amputation 06/08/2011  . Mechanical complication of graft of bone, cartilage, muscle or tendon 05/10/2011  . Depression 05/03/2011  . 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

## 2014-08-07 ENCOUNTER — Ambulatory Visit: Payer: Medicare Other | Admitting: Physical Therapy

## 2014-08-07 ENCOUNTER — Ambulatory Visit (HOSPITAL_COMMUNITY): Payer: Self-pay | Admitting: Psychology

## 2014-08-07 ENCOUNTER — Encounter: Payer: Self-pay | Admitting: Physical Therapy

## 2014-08-07 DIAGNOSIS — R269 Unspecified abnormalities of gait and mobility: Secondary | ICD-10-CM

## 2014-08-07 DIAGNOSIS — Z89612 Acquired absence of left leg above knee: Secondary | ICD-10-CM

## 2014-08-07 DIAGNOSIS — R531 Weakness: Secondary | ICD-10-CM

## 2014-08-07 DIAGNOSIS — R6889 Other general symptoms and signs: Secondary | ICD-10-CM

## 2014-08-07 DIAGNOSIS — R2689 Other abnormalities of gait and mobility: Secondary | ICD-10-CM

## 2014-08-08 ENCOUNTER — Encounter: Payer: Self-pay | Admitting: Physical Therapy

## 2014-08-08 NOTE — Therapy (Signed)
[facility] [address] [place], [place], [address] Phone: [phone]   Fax:  [phone]  Physical Therapy Treatment  Patient Details  Name: Cassandra Andrade MRN: [id number] Date of Birth: 02/02/1966 Referring Provider:  Smothers, Deborah N, NP  Encounter Date: 08/07/2014      PT End of Session - 08/07/14 1530    Visit Number 19   Number of Visits 22   Date for PT Re-Evaluation 08/09/14   PT Start Time 1532   PT Stop Time 1615   PT Time Calculation (min) 43 min   Activity Tolerance Patient tolerated treatment well   Behavior During Therapy WFL for tasks assessed/performed      Past Medical History  Diagnosis Date  . Anxiety   . Depression   . Seizures   . CVA (cerebrovascular accident due to intracerebral hemorrhage) 2004  . Arthritis   . MRSA (methicillin resistant Staphylococcus aureus)   . S/P BKA (below knee amputation) unilateral     Right   . Thyroid disease   . Cancer     Osteosarcoma, right leg  . 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 DVT (deep venous thrombosis)     Left leg    Past Surgical History  Procedure Laterality Date  . Fracture surgery      Rt. femur, folllowed by MRSA  . Brain surgery  2004    cerebral aneurysm  . Total knee arthroplasty Right   . Above knee leg amputation Right     Osteosarcoma    There were no vitals filed for this visit.  Visit Diagnosis:  Abnormality of gait  Generalized weakness  Decreased functional activity tolerance  Balance problems  Status post above knee amputation of left lower extremity      Subjective Assessment - 08/07/14 1530    Subjective No falls. Mother & patient report walking with either rollator walker with seat to rest if needed or standard RW to access community. They are not using the w/c to access community any longer. Mother reports pt is walking with walker in home by herself   Patient is accompained  by: Family member   Currently in Pain? No/denies                         [facility] Adult PT Treatment/Exercise - 08/07/14 1530    Transfers   Transfers Sit to Stand;Stand to Sit   Sit to Stand 6: Modified independent (Device/Increase time);With upper extremity assist;With armrests;From chair/3-in-1   Stand to Sit 6: Modified independent (Device/Increase time);With upper extremity assist;With armrests;To chair/3-in-1  from rollator, to chair without armrests with UEs   Ambulation/Gait   Ambulation/Gait Yes   Ambulation/Gait Assistance 5: Supervision   Ambulation Distance (Feet) 300 Feet   Assistive device Prosthesis;Rolling walker;Rollator   Gait Pattern Decreased stance time - right;Decreased step length - left;Trunk rotated posteriorly on right;Step-through pattern   Ambulation Surface Indoor;Level   Stairs Yes   Stairs Assistance 5: Supervision   Stair Management Technique One rail Right;Step to pattern;Sideways  2 hands on rail   Number of Stairs 4   Ramp 5: Supervision  rollator walker & prosthesis   Ramp Details (indicate cue type and reason) mother verbalizes proper technique   Curb 5: Supervision  rollator walker & prosthesis   Curb Details (indicate cue type and reason) Mother verbalizes proper technique   Balance   Balance Assessed Yes   Dynamic Standing Balance     Dynamic Standing - Balance Support Left upper extremity supported;During functional activity   Dynamic Standing - Level of Assistance 5: Stand by assistance   Dynamic Standing - Balance Activities Reaching for objects;Reaching across midline   Dynamic Standing - Comments Reaches 10" anteriorly & reaches across midline & to floor   Prosthetics   Prosthetic Care Comments  Mother & pt verbalize proper prosthetic care including pt donning modified independent   Current prosthetic wear tolerance (#hours/day)  all awake hours   Current prosthetic weight-bearing tolerance (hours/day)  tolerating full WB    Residual limb condition  no open areas   Donning Prosthesis Modified independent (device/increased time)   Doffing Prosthesis Modified independent (device/increased time)                  PT Short Term Goals - 06/12/14 1315    PT SHORT TERM GOAL #1   Title tolerates wear of prosthesis >50% of awake hours without any skin issues or discomfort (Target Date: 06/14/2014)   Baseline NOT MET 06/12/14   Time 4   Period Weeks   Status Not Met   PT SHORT TERM GOAL #2   Title donnes prosthesis correctly with mother cueing / supervising (Target Date: 06/14/2014)   Baseline MET 06/10/14    Time 4   Period Weeks   Status Achieved   PT SHORT TERM GOAL #3   Title stands with rolling walker and prosthesis managing clothes for toileting with supervision. (Target Date: 06/14/2014)   Baseline MET 06/12/14   Time 4   Period Weeks   Status Achieved   PT SHORT TERM GOAL #4   Title ambulates 125' with rolling walker & prosthesis with supervision. (Target Date: 06/14/2014)   Baseline MET 06/10/14   Time 4   Period Weeks   Status Achieved           PT Long Term Goals - 08/07/14 1530    PT LONG TERM GOAL #1   Title tolerates wear of prosthesis >75% of awake hours without change in skin integrity or tenderness. (NEW Target Date: 08/09/2014)   Baseline MET Patient reports wearing prosthesis all awake hours without issues.   Time 8   Period Weeks   Status Achieved   PT LONG TERM GOAL #2   Title donnes prosthesis modified independent and mother verbalizes proper prosthetic care. (Target Date: 07/12/2014)   Baseline MET 07/12/2014   Time 8   Period Weeks   Status Achieved   PT LONG TERM GOAL #3   Title ambulates 250' with rolling walker & prosthesis with supervision.  (NEW Target Date: 08/09/2014)   Baseline MET 08/07/2014   Time 8   Period Weeks   Status Achieved   PT LONG TERM GOAL #4   Title negotiates ramps & curbs with rolling walker and stairs with 2 hands on 1 rail with supervision.   (NEW Target Date: 08/09/2014)   Baseline MET 08/07/2014   Time 8   Period Weeks   Status Achieved   PT LONG TERM GOAL #5   Title standing balance with rolling walker & prosthesis reaching 10" anteriorly, across midline and to floor with supervision. (NEW Target Date: 08/09/2014)   Baseline MET 08/07/2014   Time 4   Period Weeks   Status On-going   PT LONG TERM GOAL #6   Title Mother reports safely assisting patient with ambulating to access community with RW & prosthesis.  (NEW Target Date: 08/09/2014)   Baseline MET 08/07/2014   Time 4  Period Weeks   Status Achieved               Plan - 08/07/14 1530    Clinical Impression Statement Patient met all LTGs. She is able to access the community with rollator walker with her mother's supervision which decreases burden of care and risk of injury with loading / unloading w/c on her elderly mother. Also increasing mobility decreases risk of clot formation.        Pt will benefit from skilled therapeutic intervention in order to improve on the following deficits Abnormal gait;Decreased activity tolerance;Decreased cognition;Decreased endurance;Decreased knowledge of precautions;Decreased knowledge of use of DME;Decreased mobility;Decreased range of motion   Rehab Potential Good   Clinical Impairments Affecting Rehab Potential cognition   PT Frequency 1x / week   PT Duration 4 weeks   PT Treatment/Interventions ADLs/Self Care Home Management;DME Instruction;Gait training;Stair training;Functional mobility training;Therapeutic activities;Therapeutic exercise;Balance training;Neuromuscular re-education;Patient/family education;Other (comment);Prosthetic Training   PT Next Visit Plan Assess LTGs & discharge   Consulted and Agree with Plan of Care Patient;Family member/caregiver   Family Member Consulted mother          G-Codes - 08/07/14 1530    Functional Assessment Tool Used donnes prosthesis modified independent. Wears prosthesis all  awake hours without issues. Pt & mother verbalize proper prosthetic care.   Functional Limitation Self care   Self Care Goal Status (G8988) At least 20 percent but less than 40 percent impaired, limited or restricted   Self Care Discharge Status (G8989) At least 20 percent but less than 40 percent impaired, limited or restricted      Problem List Patient Active Problem List   Diagnosis Date Noted  . Abnormal finding on mammography 05/18/2014  . Major depression, recurrent 03/19/2014  . Vision disturbance 02/28/2014  . Postprocedural state 12/31/2013  . Lupus anticoagulant disorder 12/20/2013  . Back pain, chronic 11/07/2013  . Absence of bladder continence 08/05/2013  . History of anticoagulant therapy 07/17/2013  . Long term current use of anticoagulant therapy 07/17/2013  . Epilepsy with intractable epilepsy 02/28/2013  . Absolute anemia 02/28/2013  . Seizures 02/21/2013  . Seizure 02/17/2013  . DVT, bilateral lower limbs 11/07/2012  . Hypothyroid 07/04/2012  . Seizure disorder 07/04/2012  . Chronic pain syndrome 07/04/2012  . S/P BKA (below knee amputation) 07/04/2012  . Chronic pain associated with significant psychosocial dysfunction 07/04/2012  . Cerebrovascular accident, late effects 06/05/2012  . Status post above knee amputation 06/08/2011  . Mechanical complication of graft of bone, cartilage, muscle or tendon 05/10/2011  . Depression 05/03/2011  . 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      

## 2014-08-08 NOTE — Therapy (Unsigned)
[facility] [address] [place], [place], [address] Phone: [phone]   Fax:  [phone]  Patient Details  Name: Cassandra Andrade MRN: [id number] Date of Birth: [birth year] Referring Provider:  No ref. provider found  Encounter Date: 08/08/2014  PHYSICAL THERAPY DISCHARGE SUMMARY  Visits from Start of Care: 19  Current functional level related to goals / functional outcomes:     PT Long Term Goals - 08/07/14 1530    PT LONG TERM GOAL #1   Title tolerates wear of prosthesis >75% of awake hours without change in skin integrity or tenderness. (NEW Target Date: 08/09/2014)   Baseline MET Patient reports wearing prosthesis all awake hours without issues.   Time 8   Period Weeks   Status Achieved   PT LONG TERM GOAL #2   Title donnes prosthesis modified independent and mother verbalizes proper prosthetic care. (Target Date: 07/12/2014)   Baseline MET 07/12/2014   Time 8   Period Weeks   Status Achieved   PT LONG TERM GOAL #3   Title ambulates 250' with rolling walker & prosthesis with supervision.  (NEW Target Date: 08/09/2014)   Baseline MET 08/07/2014   Time 8   Period Weeks   Status Achieved   PT LONG TERM GOAL #4   Title negotiates ramps & curbs with rolling walker and stairs with 2 hands on 1 rail with supervision.  (NEW Target Date: 08/09/2014)   Baseline MET 08/07/2014   Time 8   Period Weeks   Status Achieved   PT LONG TERM GOAL #5   Title standing balance with rolling walker & prosthesis reaching 10" anteriorly, across midline and to floor with supervision. (NEW Target Date: 08/09/2014)   Baseline MET 08/07/2014   Time 4   Period Weeks   Status On-going   PT LONG TERM GOAL #6   Title Mother reports safely assisting patient with ambulating to access community with RW & prosthesis.  (NEW Target Date: 08/09/2014)   Baseline MET 08/07/2014   Time 4   Period Weeks   Status Achieved       Remaining  deficits: Patient has neurological issues noted at evaluation. However patient is able to ambulate at community level with her mother using the prosthesis and rolling walker or rollator   Education / Equipment: Prosthetic care & activity level progression  Plan: Patient agrees to discharge.  Patient goals were met. Patient is being discharged due to meeting the stated rehab goals.  ?????       Denishia Citro  PT, DPT  08/08/2014, 12:28 PM  [facility] [address] [place], [place], [address] Phone: [phone]   Fax:  [phone]

## 2014-08-30 ENCOUNTER — Ambulatory Visit (INDEPENDENT_AMBULATORY_CARE_PROVIDER_SITE_OTHER): Payer: Medicare Other | Admitting: Psychology

## 2014-08-30 DIAGNOSIS — F330 Major depressive disorder, recurrent, mild: Secondary | ICD-10-CM | POA: Diagnosis not present

## 2014-08-30 NOTE — Progress Notes (Signed)
   THERAPIST PROGRESS NOTE  Session Time: 3.30pm-4.22pm  Participation Level: Active  Behavioral Response: Well GroomedAlert, AFFECT WNL  Type of Therapy: Individual Therapy  Treatment Goals addressed: Diagnosis: MDD and goal 1.  Interventions: CBT and Supportive  Summary: Cassandra Andrade is a 49 y.o. female who presents with generally full and bright affect.  Pt came w/ her prosthesis today and reported that she did her exercises walking around the house today. Pt reports that she has been making progress w/ walking and also has her top dentures in today and feels better about this.  Pt reported that she had been experiencing depressed mood again, but this has improved. Pt reported that she has been keeping focused on stating and thinking about the direction she wants to be heading. Pt reported that her sister helped her by putting up the beginning of a poem she wrote to her kids and that she is using this as daily affirmation. Pt acknowledges that despite her limitations and challenges there are ways she can meet her wants and continue to make progress.   Suicidal/Homicidal: Nowithout intent/plan  Therapist Response: Assessed pt current functioning per pt report. Processed w/pt her report of mood and things that have been helpful for her with depressed mood.  Explored w/pt acceptance of changes due to medical problems but not allowing that to keep her from moving forward and engaging.   Plan: Return again in 4 weeks.  Diagnosis: MDD    Jan Fireman, [facility] 08/30/2014

## 2014-09-05 ENCOUNTER — Ambulatory Visit (HOSPITAL_COMMUNITY): Payer: Self-pay | Admitting: Psychiatry

## 2014-09-19 ENCOUNTER — Ambulatory Visit (INDEPENDENT_AMBULATORY_CARE_PROVIDER_SITE_OTHER): Payer: Medicare Other | Admitting: Psychiatry

## 2014-09-19 ENCOUNTER — Encounter (HOSPITAL_COMMUNITY): Payer: Self-pay | Admitting: Psychiatry

## 2014-09-19 VITALS — BP 119/76 | HR 86 | Ht 67.5 in | Wt 189.0 lb

## 2014-09-19 DIAGNOSIS — F339 Major depressive disorder, recurrent, unspecified: Secondary | ICD-10-CM | POA: Diagnosis not present

## 2014-09-19 DIAGNOSIS — F321 Major depressive disorder, single episode, moderate: Secondary | ICD-10-CM

## 2014-09-19 MED ORDER — FLUOXETINE HCL 40 MG PO CAPS: 40.0000 mg | ORAL_CAPSULE | Freq: Every day | ORAL | Status: DC

## 2014-09-19 NOTE — Progress Notes (Signed)
Cassandra Andrade Progress Note  Cassandra Andrade [id number] 49 y.o.  09/19/2014 2:46 PM  Chief Complaint:  Medication management and follow-up.        History of Present Illness: Cassandra Andrade came for her followup appointment.   She is taking Prozac 40 mg and she is feeling better.  She still have moments of crying spells and depression when she feel about her physical health and also she had regret that she cannot help her mother.  She feels some time burden to her mother however she denies any suicidal thoughts or homicidal thought.  She is seeing Cassandra Andrade for counseling.  Her mother is very supportive usually calms on every appointment with her.  Patient is happy that her seizures are under control.  She recently seen the neurologist and her medicines were continued.  Patient denies any agitation, anger or any mood swing.  Her sleep is good.  Her appetite is okay.  She is using prosthesis almost every day and able to walk however she still have a lot of anxiety about using them. She is hoping to start school and she wants to study logistic and looking for a school who offers the subject.  Patient denies drinking or using any illegal substances.  Her appetite is okay.  Her vitals are stable.  Patient has not seen her children since April who lives in [place].  Suicidal Ideation: Yes Plan Formed: No Patient has means to carry out plan: No  Homicidal Ideation: No Plan Formed: No Patient has means to carry out plan: No  Review of Systems  Constitutional: Negative.   HENT: Negative.   Musculoskeletal:       Below-knee amputation and right leg  Skin: Negative.   Neurological: Positive for tingling.  Psychiatric/Behavioral: Negative.     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  She has history of MRSA resulted right knee amputation.  She has hypothyroidism, chronic neuropathy pain , headache and seizure disorder.  She see Dr. Jannifer Andrade for seizures.  Her primary care is Cassandra Andrade at [facility].    Outpatient Encounter Prescriptions as of 09/19/2014  Medication Sig  . Ascorbic Acid (VITAMIN C) 1000 MG tablet Take 1,000 mg by mouth daily.   . Calcium Carbonate-Vitamin D 600-400 MG-UNIT per tablet Take 1 tablet by mouth daily.   [name] DIAZEPAM RE Place 20 mg rectally. Rectally as needed for seizure lasting longer than 2 minutes  . FLUoxetine (PROZAC) 40 MG capsule Take 1 capsule (40 mg total) by mouth daily.  [name] lacosamide (VIMPAT) 200 MG TABS tablet Take 1 tablet (200 mg total) by mouth 2 (two) times daily.  [name] lamoTRIgine (LAMICTAL) 100 MG tablet Take 250 mg by mouth every evening. Takes 2.5  Tablets =  250 mg  Every evening.  . lamoTRIgine (LAMICTAL) 25 MG tablet Take 225 mg by mouth every morning. Takes  225 mg  Every  Morning  =  9  Tablets  Of  25 mg.  . levETIRAcetam (KEPPRA) 1000 MG tablet Take 2 tablets (2,000 mg total) by mouth 2 (two) times daily.  [name] levothyroxine (SYNTHROID, LEVOTHROID) 50 MCG tablet Take 50 mcg by mouth daily before  breakfast.  . promethazine (PHENERGAN) 25 MG tablet Take 25 mg by mouth every 6 (six) hours as needed. nausea  . warfarin (COUMADIN) 5 MG tablet Take 5 mg by mouth 2 (two) times daily.  . [DISCONTINUED] FLUoxetine (PROZAC) 20 MG capsule Take 1 capsule (20 mg total) by mouth 2 (two) times daily.   No facility-administered encounter medications on file as of 09/19/2014.   Past Psychiatric History/Hospitalization(s): Patient has at least one psychiatric admission in 2011 due to overdose on her pain medication.  At that time she has a  marital conflict and having argument with her husband.  She denies any history of mania psychosis hallucination.  In the past she has given Zoloft and Remeron however she do not remember the details.  She remembered taking Lexapro and Abilify but it was stopped because lack of response.   Anxiety: Yes Bipolar Disorder: No Depression: Yes Mania: No Psychosis: No Schizophrenia: No Personality Disorder: No Hospitalization for psychiatric illness: Yes History of Electroconvulsive Shock Therapy: No Prior Suicide Attempts: Yes  Physical Exam: Constitutional:  BP 119/76 mmHg  Pulse 86  Ht 5' 7.5" (1.715 m)  Wt 189 lb (85.73 kg)  BMI 29.15 kg/m2  General Appearance: alert, oriented, no acute distress and well nourished  Recent Results (from the past 2160 hour(s))  CBC & Diff and Retic     Status: Abnormal   Collection Time: 07/04/14 10:06 AM  Result Value Ref Range   WBC 3.2 (L) 3.9 - 10.3 10e3/uL   NEUT# 1.6 1.5 - 6.5 10e3/uL   HGB 12.1 11.6 - 15.9 g/dL   HCT 37.8 34.8 - 46.6 %   Platelets 181 145 - 400 10e3/uL   MCV 90.6 79.5 - 101.0 fL   MCH 29.0 25.1 - 34.0 pg   MCHC 32.0 31.5 - 36.0 g/dL   RBC 4.17 3.70 - 5.45 10e6/uL   RDW 13.5 11.2 - 14.5 %   lymph# 1.3 0.9 - 3.3 10e3/uL   MONO# 0.3 0.1 - 0.9 10e3/uL   Eosinophils Absolute 0.0 0.0 - 0.5 10e3/uL   Basophils Absolute 0.0 0.0 - 0.1 10e3/uL   NEUT% 48.6 38.4 - 76.8 %   LYMPH% 41.1 14.0 - 49.7 %   MONO% 9.1 0.0 - 14.0 %   EOS% 0.9 0.0 - 7.0 %   BASO% 0.3 0.0 - 2.0 %   Retic % 1.23 0.70 - 2.10 %   Retic Ct Abs 51.29 33.70 - 90.70 10e3/uL   Immature Retic Fract 3.10 1.60 - 10.00 %  Comprehensive metabolic panel (Cmet) - [facility]     Status: Abnormal   Collection Time: 07/04/14 10:06 AM  Result Value Ref Range   Sodium 143 136 - 145 mEq/L   Potassium 4.0 3.5 - 5.1 mEq/L   Chloride 105 98 - 109 mEq/L   CO2 29 22 - 29 mEq/L   Glucose 83 70 - 140 mg/dl   BUN 19.5 7.0 - 26.0 mg/dL   Creatinine 1.0 0.6 - 1.1 mg/dL   Total  Bilirubin 0.27 0.20 - 1.20 mg/dL   Alkaline Phosphatase 96 40 - 150 U/L   AST 23 5 - 34 U/L   ALT 25 0 - 55 U/L   Total Protein 7.2 6.4 - 8.3 g/dL   Albumin 3.8 3.5 - 5.0 g/dL   Calcium 9.7 8.4 - 10.4 mg/dL   Anion Gap 9 3 - 11 mEq/L   EGFR 81 (L) >90 ml/min/1.73 m2    Comment: eGFR is calculated using the CKD-EPI  Creatinine Equation (2009)  Ferritin     Status: None   Collection Time: 07/04/14 10:06 AM  Result Value Ref Range   Ferritin 238 9 - 269 ng/ml  Iron and TIBC [facility]     Status: None   Collection Time: 07/04/14 10:06 AM  Result Value Ref Range   Iron 66 41 - 142 ug/dL   TIBC 268 236 - 444 ug/dL   UIBC 201 120 - 384 ug/dL   %SAT 25 21 - 57 %    Musculoskeletal: Strength & Muscle Tone: decreased Gait & Station: Patient is wheelchair-bound.  She has the right below-knee amputation. Patient leans: See above  Psychiatric: Speech (describe rate, volume, coherence, spontaneity, and abnormalities if any): Soft with decreased volume and tone.    Thought Process (describe rate, content, abstract reasoning, and computation): Logical and goal-directed.    Associations: Intact, no delusions or any paranoia.  Her fund of knowledge is average.    Thoughts: normal and No active or passive suicidal thoughts or homicidal thoughts.  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  She has some moments of depression but denies any suicidal thoughts.  Encouraged to give appointment with therapist.  Discussed medication  side effects and benefits.She is taking Lamictal , vimpat and Keppra from her neurologist.  Recommended to call us back if she has any question, concern or if she feels worsening of the symptoms.  I will see her again in 3 months.   Daphnie Venturini T., MD 09/19/2014

## 2014-09-26 ENCOUNTER — Ambulatory Visit (HOSPITAL_COMMUNITY): Payer: Self-pay | Admitting: Psychology

## 2014-09-26 ENCOUNTER — Encounter (HOSPITAL_COMMUNITY): Payer: Self-pay | Admitting: Psychology

## 2014-09-26 NOTE — Progress Notes (Signed)
Cassandra Andrade is a 49 y.o. female patient who didn't show for her appointment.  Letter sent.        Jan Fireman, LPC

## 2014-10-29 ENCOUNTER — Encounter (HOSPITAL_COMMUNITY): Payer: Self-pay | Admitting: Psychology

## 2014-10-29 ENCOUNTER — Ambulatory Visit (HOSPITAL_COMMUNITY): Payer: Self-pay | Admitting: Psychology

## 2014-10-29 NOTE — Progress Notes (Signed)
Cassandra Andrade is a 49 y.o. female patient who didn't show for her appointment.  Letter sent.        Yusif Gnau, LPC 

## 2014-11-04 ENCOUNTER — Ambulatory Visit (HOSPITAL_BASED_OUTPATIENT_CLINIC_OR_DEPARTMENT_OTHER): Payer: Medicare Other | Admitting: Hematology

## 2014-11-04 ENCOUNTER — Telehealth: Payer: Self-pay | Admitting: Hematology

## 2014-11-04 ENCOUNTER — Other Ambulatory Visit (HOSPITAL_BASED_OUTPATIENT_CLINIC_OR_DEPARTMENT_OTHER): Payer: Medicare Other

## 2014-11-04 ENCOUNTER — Encounter: Payer: Self-pay | Admitting: Hematology

## 2014-11-04 VITALS — BP 109/66 | HR 76 | Temp 98.0°F | Resp 16 | Ht 67.5 in | Wt 195.5 lb

## 2014-11-04 DIAGNOSIS — D649 Anemia, unspecified: Secondary | ICD-10-CM

## 2014-11-04 DIAGNOSIS — Z7901 Long term (current) use of anticoagulants: Secondary | ICD-10-CM

## 2014-11-04 DIAGNOSIS — I82403 Acute embolism and thrombosis of unspecified deep veins of lower extremity, bilateral: Secondary | ICD-10-CM

## 2014-11-04 DIAGNOSIS — Z89619 Acquired absence of unspecified leg above knee: Secondary | ICD-10-CM

## 2014-11-04 DIAGNOSIS — Z85830 Personal history of malignant neoplasm of bone: Secondary | ICD-10-CM | POA: Diagnosis not present

## 2014-11-04 DIAGNOSIS — I82503 Chronic embolism and thrombosis of unspecified deep veins of lower extremity, bilateral: Secondary | ICD-10-CM | POA: Diagnosis present

## 2014-11-04 LAB — CBC & DIFF AND RETIC
BASO%: 0.3 % (ref 0.0–2.0)
Basophils Absolute: 0 10*3/uL (ref 0.0–0.1)
EOS ABS: 0.1 10*3/uL (ref 0.0–0.5)
EOS%: 1.4 % (ref 0.0–7.0)
HCT: 35.6 % (ref 34.8–46.6)
HGB: 11.4 g/dL — ABNORMAL LOW (ref 11.6–15.9)
Immature Retic Fract: 5.8 % (ref 1.60–10.00)
LYMPH%: 45 % (ref 14.0–49.7)
MCH: 28.9 pg (ref 25.1–34.0)
MCHC: 32 g/dL (ref 31.5–36.0)
MCV: 90.1 fL (ref 79.5–101.0)
MONO#: 0.3 10*3/uL (ref 0.1–0.9)
MONO%: 7.2 % (ref 0.0–14.0)
NEUT#: 1.7 10*3/uL (ref 1.5–6.5)
NEUT%: 46.1 % (ref 38.4–76.8)
Platelets: 189 10*3/uL (ref 145–400)
RBC: 3.95 10*6/uL (ref 3.70–5.45)
RDW: 13.6 % (ref 11.2–14.5)
Retic %: 1.03 % (ref 0.70–2.10)
Retic Ct Abs: 40.69 10*3/uL (ref 33.70–90.70)
WBC: 3.6 10*3/uL — ABNORMAL LOW (ref 3.9–10.3)
lymph#: 1.6 10*3/uL (ref 0.9–3.3)

## 2014-11-04 LAB — COMPREHENSIVE METABOLIC PANEL (CC13)
ALBUMIN: 3.9 g/dL (ref 3.5–5.0)
ALK PHOS: 90 U/L (ref 40–150)
ALT: 27 U/L (ref 0–55)
AST: 21 U/L (ref 5–34)
Anion Gap: 6 mEq/L (ref 3–11)
BILIRUBIN TOTAL: 0.2 mg/dL (ref 0.20–1.20)
BUN: 14.4 mg/dL (ref 7.0–26.0)
CALCIUM: 10 mg/dL (ref 8.4–10.4)
CO2: 31 mEq/L — ABNORMAL HIGH (ref 22–29)
Chloride: 105 mEq/L (ref 98–109)
Creatinine: 1 mg/dL (ref 0.6–1.1)
EGFR: 76 mL/min/{1.73_m2} — AB (ref >90)
GLUCOSE: 96 mg/dL (ref 70–140)
Potassium: 3.9 mEq/L (ref 3.5–5.1)
SODIUM: 142 meq/L (ref 136–145)
TOTAL PROTEIN: 7.1 g/dL (ref 6.4–8.3)

## 2014-11-04 LAB — IRON AND TIBC CHCC
%SAT: 18 % — ABNORMAL LOW (ref 21–57)
Iron: 44 ug/dL (ref 41–142)
TIBC: 244 ug/dL (ref 236–444)
UIBC: 200 ug/dL (ref 120–384)

## 2014-11-04 LAB — FERRITIN CHCC: FERRITIN: 219 ng/mL (ref 9–269)

## 2014-11-04 NOTE — Telephone Encounter (Signed)
per pof to sch pt appt-gave pt copy of avs °

## 2014-11-04 NOTE — Progress Notes (Signed)
[facility]  [phone] [phone]  HEMATOLOGY CLINIC FOLLOW UP VISIT    ID: Fryda Molenda OB: May 09, 1965 MR#: [id number] YYT#:[id number] Patient Care Team: Junie Panning, NP as PCP - General (Nurse Practitioner) Kathlee Nations, MD as Consulting Physician (Psychiatry)  REASON FOR OFFICE VISIT:  1. Bilateral lower extremity deep venous thrombosis.  2. Right below-knee amputation for osteogenic sarcoma.  3. Multiple miscarriages.  4. Positive lupus anticoagulant, repeated negative   PATIENT IDENTIFICATION  Cassandra Andrade is a pleasant 49 years old African-American female who is coming to established here at [facility]. Previously she was being seen by Dr. Burney Gauze at [facility] location. 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 She had radiation and chemotherapy. Should the surgery with the bone prosthesis. 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 It also causes her some memory problems. She was in coma for a while and she has a tracheostomy done and was placed in a long-term care facility. She has history of multiple miscarriages. She does have 3 children. She also had a right AKA because of the right lower leg infection Iin2013. 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 She was initially evaluated by Dr. Burney Gauze on 12/01/2012. She is wheelchair bound. She also have history of situational depression, iron deficiency anemia and hypothyroidism. 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 He also takes antiseizure therapy is no known family history of thromboembolic problems. She had couple of venous duplex studies done last one was on 09/19/2013 showing similar appearing residual chronic thrombus within the common femoral and femoral veins bilaterally. No significant interval change from 04/04/2013  patient's mother have a machine at home where they check her INR and the target INR is between 2-3. Her current Coumadin dose 10 mg daily and the results are being called out to physician who regulates the Coumadin. Patient and her mother would like to continue the same arrangement although I offered her that we have a dedicated Coumadin clinic and we can help in Coumadin regulation if they desire. Because of a positive lupus anticoagulant, a prior history of osteosarcoma, generalized deconditioning and wheelchair bound status, and presence of chronic thrombus on vascular imaging, Dr. Martha Clan recommended lifelong Coumadin.  INTERIM HISTORY: [name] is back today with her mother for a follow-up. She is slightly more active than before, has been using the right leg prosthesis intermittently at home. She walks around with a walker independently. She is probably up 40% of the daytime. She is compliant with Coumadin, her PT/INR has been followed by her primary care physicians clinic. She does have easy bruise on her right arm sometime, but no other significant bleeding. She has moderate fatigue, denies significant pain, dyspnea, abdominal discomfort or other symptoms.  CURRENT TREATMENT: Coumadin 10 mg  to maintain INR between 2-3  REVIEW OF SYSTEMS: All other 10 point review of systems is negative.    PAST MEDICAL HISTORY: Past Medical History  Diagnosis Date  . Anxiety   . Depression   . Seizures   . CVA (cerebrovascular accident due to intracerebral hemorrhage) 2004  . Arthritis   . MRSA (methicillin resistant Staphylococcus aureus)   . S/P BKA (below knee amputation) unilateral     Right   . Thyroid disease   . Cancer  Osteosarcoma, right leg  . 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 DVT (deep venous thrombosis)     Left leg   PAST SURGICAL HISTORY: Past Surgical History  Procedure Laterality Date  . Fracture surgery      Rt. femur, folllowed by MRSA  . Brain  surgery  2004    cerebral aneurysm  . Total knee arthroplasty Right   . Above knee leg amputation Right     Osteosarcoma   FAMILY HISTORY Family History  Problem Relation Age of Onset  . Anxiety disorder Mother   . Diabetes Father    GYNECOLOGIC HISTORY:  No LMP recorded. Patient has had an ablation.   SOCIAL HISTORY:  Social History   Social History  . Marital Status: Legally Separated    Spouse Name: N/A  . Number of Children: 3  . Years of Education: GED   Occupational History  . Not on file.   Social History Main Topics  . Smoking status: Never Smoker   . Smokeless tobacco: Never Used     Comment: never used tobacco  . Alcohol Use: No  . Drug Use: No  . Sexual Activity: Not Currently   Other Topics Concern  . Not on file   Social History Narrative   Patient lives at home with mom.    Patient has GED.    Patient has 3 children.    Patient is married but in the process of divorced.          ADVANCED DIRECTIVES: <no information>  HEALTH MAINTENANCE: Social History  Substance Use Topics  . Smoking status: Never Smoker   . Smokeless tobacco: Never Used     Comment: never used tobacco  . Alcohol Use: No   Colonoscopy: PAP: Bone density: Lipid panel:  Allergies  Allergen Reactions  . Vicodin [Hydrocodone-Acetaminophen] Nausea And Vomiting  . Morphine Rash  . Morphine And Related Rash    Allergic reaction only in iv form    Current Outpatient Prescriptions  Medication Sig Dispense Refill  . Ascorbic Acid (VITAMIN C) 1000 MG tablet Take 1,000 mg by mouth daily.     . Calcium Carbonate-Vitamin D 600-400 MG-UNIT per tablet Take 1 tablet by mouth daily.     [name] DIAZEPAM RE Place 20 mg rectally. Rectally as needed for seizure lasting longer than 2 minutes    . FLUoxetine (PROZAC) 40 MG capsule Take 1 capsule (40 mg total) by mouth daily. 30 capsule 2  . lacosamide (VIMPAT) 200 MG TABS tablet Take 1 tablet (200 mg total) by mouth 2 (two) times daily. 180  tablet 1  . lamoTRIgine (LAMICTAL) 100 MG tablet Take 250 mg by mouth every evening. Takes 2.5  Tablets =  250 mg  Every evening.    . lamoTRIgine (LAMICTAL) 25 MG tablet Take 225 mg by mouth every morning. Takes  225 mg  Every  Morning  =  9  Tablets  Of  25 mg.    . levETIRAcetam (KEPPRA) 1000 MG tablet Take 2 tablets (2,000 mg total) by mouth 2 (two) times daily. 360 tablet 1  . levothyroxine (SYNTHROID, LEVOTHROID) 50 MCG tablet Take 50 mcg by mouth daily before breakfast.    . promethazine (PHENERGAN) 25 MG tablet Take 25 mg by mouth every 6 (six) hours as needed. nausea    . warfarin (COUMADIN) 5 MG tablet Take 5 mg by mouth 2 (two) times daily.     No current facility-administered medications for this visit.  OBJECTIVE: Filed Vitals:   11/04/14 1506  BP: 109/66  Pulse: 76  Temp: 98 F (36.7 C)  Resp: 16   Body mass index is 30.15 kg/(m^2). ECOG FS: 3 Ocular: Sclerae unicteric, pupils equal, round and reactive to light Ear-nose-throat: Oropharynx clear, dentition fair Lymphatic: No cervical or supraclavicular adenopathy Lungs no rales or rhonchi, good excursion bilaterally Heart regular rate and rhythm, no murmur appreciated Abd soft, nontender, positive bowel sounds MSK no focal spinal tenderness, no joint edema. (+) right AKA Neuro: non-focal, well-oriented, appropriate affect Breasts: Deferred  LAB RESULTS: CBC Latest Ref Rng 11/04/2014 07/04/2014 04/04/2014  WBC 3.9 - 10.3 10e3/uL 3.6(L) 3.2(L) 4.0  Hemoglobin 11.6 - 15.9 g/dL 11.4(L) 12.1 11.8  Hematocrit 34.8 - 46.6 % 35.6 37.8 38.3  Platelets 145 - 400 10e3/uL 189 181 218    CMP Latest Ref Rng 11/04/2014 07/04/2014 07/11/2013  Glucose 70 - 140 mg/dl 96 83 126(H)  BUN 7.0 - 26.0 mg/dL 14.4 19.5 16  Creatinine 0.6 - 1.1 mg/dL 1.0 1.0 1.0  Sodium 136 - 145 mEq/L 142 143 138  Potassium 3.5 - 5.1 mEq/L 3.9 4.0 3.6  Chloride 98 - 108 mEq/L - - 97(L)  CO2 22 - 29 mEq/L 31(H) 29 31  Calcium 8.4 - 10.4 mg/dL 10.0 9.7 10.0   Total Protein 6.4 - 8.3 g/dL 7.1 7.2 7.6  Albumin 3.3 - 5.5 g/dL - - 3.4  Total Bilirubin 0.20 - 1.20 mg/dL 0.20 0.27 0.40  Alkaline Phos 40 - 150 U/L 90 96 102(H)  AST 5 - 34 U/L 21 23 26   ALT 0 - 55 U/L 27 25 27     Results for GLORIANNA, GOTT (MRN [id number]) as of 11/04/2014 21:06  Ref. Range 11/04/2014 14:23  Iron Latest Ref Range: 41-142 ug/dL 44  UIBC Latest Ref Range: 120-384 ug/dL 200  TIBC Latest Ref Range: 236-444 ug/dL 244  %SAT Latest Ref Range: 21-57 % 18 (L)  Ferritin Latest Ref Range: 9-269 ng/ml 219     STUDIES: US Venous Img Lower Bilateral  09/19/2013   CLINICAL DATA:  Chronic bilateral DVT  EXAM: BILATERAL LOWER EXTREMITY VENOUS DOPPLER ULTRASOUND  TECHNIQUE: Gray-scale sonography with graded compression, as well as color Doppler and duplex ultrasound were performed to evaluate the lower extremity deep venous systems from the level of the common femoral vein and including the common femoral, femoral, profunda femoral, popliteal and calf veins including the posterior tibial, peroneal and gastrocnemius veins when visible. The superficial great saphenous vein was also interrogated. Spectral Doppler was utilized to evaluate flow at rest and with distal augmentation maneuvers in the common femoral, femoral and popliteal veins.  COMPARISON:  None.  FINDINGS: Deep veins: Bilateral lower extremity deep veins demonstrate similar recanalized thrombus with mural thrombus seen throughout both common femoral veins and the femoral veins of the thigh. No significant popliteal or tibial thrombus is identified on the left. The patient is status post amputation of the right lower leg.  Compressibility of deep veins: Partially compressible.  Duplex waveform respiratory phasicity:  Diminished but Normal.  Augmentation not performed.  Venous reflux: None visualized.  Other findings: No evidence of superficial thrombophlebitis or abnormal fluid collection.  IMPRESSION: Similar appearing residual  chronic thrombus within the common femoral and femoral veins bilaterally. No significant interval change.   Electronically Signed   By: Daryll Brod M.D.   On: 09/19/2013 12:26    ASSESSMENT/PLAN:  Ms. Tinner is a 49 year old African American female.  1. History of bilateral  DVT, transient Lupus anticoagulant positive, repeat tests were negative. -Her repeated Doppler showed chronic-appearing DVT in July 2015, which has not changed much. -She is wheelchair bound due to her right lower extremity amputation above knee. She has certainly high risks for thrombosis due to her immobilization.  -I think she likely need long-term anticoagulation, she agrees with the plan. She asked about other anticoagulation options such a Xeralto, and the pros and cons of Coumadin and Xeralto, especially their reversibility. After the extensive discussion, she decided to stay on Coumadin.  -She is following up with her primary care physician for Coumadin clinic. The patient and her mother still prefer to follow up with me for long-term anticoagulation management.  -I encouraged her to be more physically active as she tolerates.  -Given the fact that she will likely to be on anticoagulation indefinitely, I do not plan to repeat her Doppler ultrasound if she is clinically doing well.   2. Right lower extremity osteosarcoma, status post amputation above knee. -Doing well, no evidence of recurrence.  3. Mild anemia -Her hemoglobin is 11.4 today, I'll study reviewed and normal ferritin and serum iron level. Her transferrin saturation is slightly low. -We'll continue monitoring her CBC  Follow-up: 6 months with lab CBC and CMP.   All questions were answered and she is in agreement with the plan. She knows to call here with any questions or concerns and to go to the ED in the event of an emergency. We can certainly see her sooner if need be.   Truitt Merle  11/04/2014

## 2014-11-06 ENCOUNTER — Ambulatory Visit (INDEPENDENT_AMBULATORY_CARE_PROVIDER_SITE_OTHER): Payer: Medicare Other | Admitting: Psychology

## 2014-11-06 DIAGNOSIS — F330 Major depressive disorder, recurrent, mild: Secondary | ICD-10-CM | POA: Diagnosis not present

## 2014-11-06 NOTE — Progress Notes (Signed)
   THERAPIST PROGRESS NOTE  Session Time: 12.30pm-1.15pm  Participation Level: Active  Behavioral Response: Well GroomedAlertaffect WNL  Type of Therapy: Individual Therapy  Treatment Goals addressed: Diagnosis: MDD and goal 1.  Interventions: Strength-based and Supportive  Summary: Cassandra Andrade is a 49 y.o. female who presents with full and bright affect.  Pt reported her mood has been good overall- w/ only few days that feels discouraged.  Pt reported that her mother's birthday is coming up and wants to do something for her but feels limited in what can do as mom is responsible for finances.  Pt reported on how she can do something for her on her computer to show her. Pt discussed that hasn't seen her sons in past couple of months. Pt did discuss that she has completed her physcial therapy and still goal is to walk but no more approved physical therapy.  Pt discussed progress she has made and that important to continue using what she can to keep progress and continue to make progress.  Pt did get on tangent about past stories w/ son's confidence building and w/ counselor assitance was able to connect to herself.   Suicidal/Homicidal: Nowithout intent/plan  Therapist Response: Assessed pt current functioning per pt report.  Processed w/pt her mood and depressive symptoms.  Discussed feeling that doesn't like mom to be responsible for her.  Explored w/pt ways in which she can continue to do things for self and for mom.  Assisted pt in recognizing her progress towards walking and maintaining progress.  Assisted pt in connecting stories of sons in past to challenging self to overcome.      Plan: Return again in 4-6 weeks.  Diagnosis: MDD, mild    Shawnte Demarest, LPC 11/06/2014

## 2014-12-05 ENCOUNTER — Ambulatory Visit (INDEPENDENT_AMBULATORY_CARE_PROVIDER_SITE_OTHER): Payer: Medicare Other | Admitting: Psychology

## 2014-12-05 DIAGNOSIS — F330 Major depressive disorder, recurrent, mild: Secondary | ICD-10-CM

## 2014-12-05 NOTE — Progress Notes (Signed)
   THERAPIST PROGRESS NOTE  Session Time: 2.30pm-3.15pm  Participation Level: Active  Behavioral Response: Well GroomedAlertaffect WNL  Type of Therapy: Individual Therapy  Treatment Goals addressed: Diagnosis: MDD and goal 1.  Interventions: CBT and Strength-based  Summary: Cassandra Andrade is a 49 y.o. female who presents with full and bright affect today. Pt reports still struggles w/ feeling down some days w/ discouragement that she is not able to walk w/ her prosthesis.  Pt does acknowledge she has made great progress but when physical therapist "took her as far as she could" does feel discouraged. Pt however states that she isn't giving up and also clear for self wants to maintain what gained. Pt reports that perhaps there is a different prosthesis or an intensive program that could assist her. Pt discussed how she plans on writing down the goals she is working towards and thinking each day of what she can do present to work towards.   Suicidal/Homicidal: Nowithout intent/plan  Therapist Response: Assessed pt current functioning per pt report.  Processed w/pt her mood and discussed feelings of discouragement experiences w/ physical limitations.  Assisted pt w/ acknowledging progress she has made and how to keep gains made and continue towards growth.    Plan: Return again in 4 weeks.  Diagnosis: MDD    Jan Fireman, [facility] 12/05/2014

## 2014-12-16 ENCOUNTER — Other Ambulatory Visit: Payer: Self-pay | Admitting: Nurse Practitioner

## 2014-12-23 ENCOUNTER — Ambulatory Visit (INDEPENDENT_AMBULATORY_CARE_PROVIDER_SITE_OTHER): Payer: Medicare Other | Admitting: Psychiatry

## 2014-12-23 ENCOUNTER — Encounter (HOSPITAL_COMMUNITY): Payer: Self-pay | Admitting: Psychiatry

## 2014-12-23 VITALS — BP 128/80 | HR 77 | Ht 67.5 in | Wt 180.0 lb

## 2014-12-23 DIAGNOSIS — F321 Major depressive disorder, single episode, moderate: Secondary | ICD-10-CM | POA: Diagnosis not present

## 2014-12-23 DIAGNOSIS — R45851 Suicidal ideations: Secondary | ICD-10-CM

## 2014-12-23 MED ORDER — FLUOXETINE HCL 40 MG PO CAPS: 40.0000 mg | ORAL_CAPSULE | Freq: Every day | ORAL | Status: DC

## 2014-12-23 NOTE — Progress Notes (Signed)
[facility] Progress Note  [name] [id number] 49 y.o.  12/23/2014 3:01 PM  Chief Complaint:  Medication management and follow-up.        History of Present Illness: Cassandra Andrade came for her followup appointment.   She like her Prozac.  She is to have moments of sadness and depression when she thinks about her children but she denies any feeling of hopelessness or worthlessness.  She sleeping good.  Her appetite is okay.  Recently she's seen her neurologist and now she is taking Lamictal 200 mg twice a day.  She is happy that they are no more seizures.  She denies any paranoia or any hallucination.  She has no tremors or shakes.  She is hoping to see the children on Thanksgiving.  Patient denies any agitation or any anger.  She used to try to walk on prosthesis and she is happy that she is making progress.  Patient denies drinking or using any illegal substances.   Suicidal Ideation: Yes Plan Formed: No Patient has means to carry out plan: No  Homicidal Ideation: No Plan Formed: No Patient has means to carry out plan: No  Review of Systems  Constitutional: Negative.   HENT: Negative.   Musculoskeletal:       Below-knee amputation and right leg  Skin: Negative.   Neurological: Positive for tingling.  Psychiatric/Behavioral: Negative.     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  She has history of MRSA resulted right knee amputation.  She has hypothyroidism, chronic neuropathy pain , headache and seizure disorder.  She see Dr. [name] for seizures.  Her primary care is [name] at  [facility].    Outpatient Encounter Prescriptions as of 12/23/2014  Medication Sig  . Ascorbic Acid (VITAMIN C) 1000 MG tablet Take 1,000 mg by mouth daily.   . Calcium Carbonate-Vitamin D 600-400 MG-UNIT per tablet Take 1 tablet by mouth daily.   [name] DIAZEPAM RE Place 20 mg rectally. Rectally as needed for seizure lasting longer than 2 minutes  . FLUoxetine (PROZAC) 40 MG capsule Take 1 capsule (40 mg total) by mouth daily.  [name] lacosamide (VIMPAT) 200 MG TABS tablet Take 1 tablet (200 mg total) by mouth 2 (two) times daily.  [name] lamoTRIgine (LAMICTAL) 200 MG tablet Take 200 mg by mouth 2 (two) times daily.  [name] levETIRAcetam (KEPPRA) 1000 MG tablet TAKE 2 TABLETS (2,000 MG TOTAL) BY MOUTH 2 (TWO) TIMES DAILY.  [name] levothyroxine (SYNTHROID, LEVOTHROID) 50 MCG tablet Take 50 mcg by mouth daily before breakfast.  . promethazine (PHENERGAN) 25 MG tablet Take 25 mg by mouth every 6 (six) hours as needed. nausea  . warfarin (COUMADIN) 5 MG tablet Take 5 mg by mouth 2 (two) times daily.  . [DISCONTINUED] FLUoxetine (PROZAC) 40 MG capsule Take 1 capsule (40 mg total) by mouth daily.  . [DISCONTINUED] lamoTRIgine (LAMICTAL) 100 MG tablet Take 250 mg by mouth every evening. Takes 2.5  Tablets =  250 mg  Every evening.  . [DISCONTINUED] lamoTRIgine (LAMICTAL) 25 MG tablet Take 225 mg by mouth every morning. Takes  225 mg  Every  Morning  =  9  Tablets  Of  25  mg.   No facility-administered encounter medications on file as of 12/23/2014.   Past Psychiatric History/Hospitalization(s): Patient has at least one psychiatric admission in 2011 due to overdose on her pain medication.  At that time she has a marital conflict and having argument with her husband.  She denies any history of mania psychosis hallucination.  In the past she has given Zoloft and Remeron however she do not remember the details.  She remembered taking Lexapro and Abilify but it was stopped because lack of response.    Anxiety: Yes Bipolar Disorder: No Depression: Yes Mania: No Psychosis: No Schizophrenia: No Personality Disorder: No Hospitalization for psychiatric illness: Yes History of Electroconvulsive Shock Therapy: No Prior Suicide Attempts: Yes  Physical Exam: Constitutional:  BP 128/80 mmHg  Pulse 77  Ht 5' 7.5" (1.715 m)  Wt 180 lb (81.647 kg)  BMI 27.76 kg/m2  General Appearance: alert, oriented, no acute distress and well nourished  Recent Results (from the past 2160 hour(s))  CBC & Diff and Retic     Status: Abnormal   Collection Time: 11/04/14  2:23 PM  Result Value Ref Range   WBC 3.6 (L) 3.9 - 10.3 10e3/uL   NEUT# 1.7 1.5 - 6.5 10e3/uL   HGB 11.4 (L) 11.6 - 15.9 g/dL   HCT 35.6 34.8 - 46.6 %   Platelets 189 145 - 400 10e3/uL   MCV 90.1 79.5 - 101.0 fL   MCH 28.9 25.1 - 34.0 pg   MCHC 32.0 31.5 - 36.0 g/dL   RBC 3.95 3.70 - 5.45 10e6/uL   RDW 13.6 11.2 - 14.5 %   lymph# 1.6 0.9 - 3.3 10e3/uL   MONO# 0.3 0.1 - 0.9 10e3/uL   Eosinophils Absolute 0.1 0.0 - 0.5 10e3/uL   Basophils Absolute 0.0 0.0 - 0.1 10e3/uL   NEUT% 46.1 38.4 - 76.8 %   LYMPH% 45.0 14.0 - 49.7 %   MONO% 7.2 0.0 - 14.0 %   EOS% 1.4 0.0 - 7.0 %   BASO% 0.3 0.0 - 2.0 %   Retic % 1.03 0.70 - 2.10 %   Retic Ct Abs 40.69 33.70 - 90.70 10e3/uL   Immature Retic Fract 5.80 1.60 - 10.00 %  Ferritin     Status: None   Collection Time: 11/04/14  2:23 PM  Result Value Ref Range   Ferritin 219 9 - 269 ng/ml  Iron and TIBC [facility]     Status: Abnormal   Collection Time: 11/04/14  2:23 PM  Result Value Ref Range   Iron 44 41 - 142 ug/dL   TIBC 244 236 - 444 ug/dL   UIBC 200 120 - 384 ug/dL   %SAT 18 (L) 21 - 57 %  Comprehensive metabolic panel (Cmet) - [facility]     Status: Abnormal   Collection Time: 11/04/14  2:24 PM  Result Value Ref Range   Sodium 142 136 - 145 mEq/L   Potassium 3.9 3.5 - 5.1 mEq/L   Chloride 105 98 - 109 mEq/L   CO2 31 (H) 22 - 29 mEq/L   Glucose 96 70 - 140 mg/dl   BUN 14.4 7.0 - 26.0  mg/dL   Creatinine 1.0 0.6 - 1.1 mg/dL   Total Bilirubin 0.20 0.20 - 1.20 mg/dL   Alkaline Phosphatase 90 40 - 150 U/L   AST 21 5 - 34 U/L   ALT 27 0 - 55 U/L   Total Protein 7.1 6.4 - 8.3 g/dL   Albumin 3.9 3.5 - 5.0 g/dL  Calcium 10.0 8.4 - 10.4 mg/dL   Anion Gap 6 3 - 11 mEq/L   EGFR 76 (L) >90 ml/min/1.73 m2    Comment: eGFR is calculated using the CKD-EPI Creatinine Equation (2009)    Musculoskeletal: Strength & Muscle Tone: decreased Gait & Station: Patient is wheelchair-bound.  She has the right below-knee amputation. Patient leans: See above  Psychiatric: Speech (describe rate, volume, coherence, spontaneity, and abnormalities if any): Soft with decreased volume and tone.    Thought Process (describe rate, content, abstract reasoning, and computation): Logical and goal-directed.    Associations: Intact, no delusions or any paranoia.  Her fund of knowledge is average.    Thoughts: normal and No active or passive suicidal thoughts or homicidal thoughts.  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  she is seeing [name] for counseling.  Discussed medication side effects and benefits.  Recommended to call us back if she has any question or any concern.  She is taking Lamictal , vimpat and Keppra from her neurologist.  I will see her again in 3 months.   Theran Vandergrift T., MD 12/23/2014

## 2014-12-25 ENCOUNTER — Ambulatory Visit (HOSPITAL_COMMUNITY): Payer: Self-pay | Admitting: Psychology

## 2015-01-08 ENCOUNTER — Ambulatory Visit (INDEPENDENT_AMBULATORY_CARE_PROVIDER_SITE_OTHER): Payer: Medicare Other | Admitting: Psychology

## 2015-01-08 DIAGNOSIS — F331 Major depressive disorder, recurrent, moderate: Secondary | ICD-10-CM

## 2015-01-08 NOTE — Progress Notes (Signed)
   THERAPIST PROGRESS NOTE  Session Time: 2.37pm-3.22pm  Participation Level: Active  Behavioral Response: Well GroomedAlertDepressed  Type of Therapy: Individual Therapy  Treatment Goals addressed: Diagnosis: MDD and goal 1.  Interventions: CBT, Strength-based and Supportive  Summary: Cassandra Andrade is a 49 y.o. female who presents with affect depressed.  Pt reported that she has struggle some w/ down days- feeling that she "should be able to walk" by now but is not. Pt was able to reframe w/ counselor support to meet self where she is at and keep working w/ self there.  Pt reported that she is looking forward to her son's visiting at Thanksgiving as hasn't seen in awhile.  Pt discussed enjoying laughter as well and that some funny movies and cartoons she has enjoyed in the past and would be good for herself today.   Suicidal/Homicidal: Nowithout intent/plan  Therapist Response: Assessed pt current functioning per pt report.  Assisted pt in reframing negative self talk and good to meet self where she is instead of focusing on where she is not.  Discussed ways pt can do this w/ her exercises and things she can do independently for self.  Also discussed positive self care and steps she can take today.   Plan: Return again in 4 weeks.  Diagnosis: MDD    Jan Fireman, [facility] 01/08/2015

## 2015-01-14 ENCOUNTER — Other Ambulatory Visit: Payer: Self-pay | Admitting: Nurse Practitioner

## 2015-01-20 ENCOUNTER — Telehealth: Payer: Self-pay | Admitting: *Deleted

## 2015-01-20 ENCOUNTER — Ambulatory Visit: Payer: Medicare Other | Admitting: Nurse Practitioner

## 2015-01-20 NOTE — Telephone Encounter (Signed)
I called and mother forgot.  Scheduled for tomorrow at 1330, be here 1315.

## 2015-01-21 ENCOUNTER — Encounter: Payer: Self-pay | Admitting: Nurse Practitioner

## 2015-01-21 ENCOUNTER — Ambulatory Visit: Payer: Self-pay | Admitting: Nurse Practitioner

## 2015-01-29 ENCOUNTER — Encounter: Payer: Self-pay | Admitting: Nurse Practitioner

## 2015-01-29 ENCOUNTER — Ambulatory Visit (INDEPENDENT_AMBULATORY_CARE_PROVIDER_SITE_OTHER): Payer: Medicare Other | Admitting: Nurse Practitioner

## 2015-01-29 VITALS — BP 109/72 | HR 70 | Ht 67.0 in | Wt 181.0 lb

## 2015-01-29 DIAGNOSIS — G40909 Epilepsy, unspecified, not intractable, without status epilepticus: Secondary | ICD-10-CM

## 2015-01-29 DIAGNOSIS — G40919 Epilepsy, unspecified, intractable, without status epilepticus: Secondary | ICD-10-CM | POA: Diagnosis not present

## 2015-01-29 DIAGNOSIS — Z5181 Encounter for therapeutic drug level monitoring: Secondary | ICD-10-CM

## 2015-01-29 DIAGNOSIS — G40209 Localization-related (focal) (partial) symptomatic epilepsy and epileptic syndromes with complex partial seizures, not intractable, without status epilepticus: Secondary | ICD-10-CM

## 2015-01-29 NOTE — Progress Notes (Signed)
I have read the note, and I agree with the clinical assessment and plan.  Zoria Rawlinson KEITH   

## 2015-01-29 NOTE — Patient Instructions (Addendum)
Check a Lamictal level in the morning Check Keppra level in the morning Continued Vimpat Lamictal and Keppra at current doses for now F/U in 6 months

## 2015-01-29 NOTE — Progress Notes (Signed)
[facility]  PATIENT: Cassandra Andrade DOB: 1965/10/21   REASON FOR VISIT: Follow-up for intractable epilepsy HISTORY FROM: Patient and mother    HISTORY OF PRESENT ILLNESS:Cassandra Andrade, 49 year old female returns for followup with her mother. She was last seen in this office 07/15/14.She has a history of intractable seizures. The patient has a history of a cerebral aneurysm and a right brain stroke. The patient has been on Lamictal and Vimpat, and Keppra was added. The patient has had video EEG monitoring that has confirmed the presence of subclinical seizures as well as clinical seizures without EEG correlates. It is felt that these events are stereotypical, and likely represent focal seizures without EEG correlates.  The patient has been placed on Keppra, currently on 2000 mg twice daily. Her seizure frequency is now weekly or a little less. The patient may have episodes of agitation, and slurred speech. The patient is eating well. 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 The patient returns to this office for an evaluation. Recent labs reviewed, Dr. Raj Janus at [facility] placed VNS on 12/31/13 at [facility] and patient is seen every 3 months for adjustments. The most recent note from Dr. Marti Sleigh 01/08/15 decreased her Lamictal to 200 mg twice daily. In addition she has continued to take 225 in the morning and 250 at night. for unknown reasons Continue Keppra thousand milligrams 2 capsules twice daily and continue Vimpat 200 twice daily.  The mother and patient claim her mood is better since placement of VNS as well.  She returns for reevaluation   REVIEW OF SYSTEMS: Full 14 system review of systems performed and notable only for those listed, all others are neg:  Constitutional: neg  Cardiovascular: neg Ear/Nose/Throat: neg  Skin: neg Eyes: neg Respiratory: neg Gastroitestinal: neg  Hematology/Lymphatic: neg  Endocrine: Intolerance to  cold Musculoskeletal:neg Allergy/Immunology: neg Neurological: History of seizure disorder, placement of VNS Psychiatric: neg Sleep : neg   ALLERGIES: Allergies  Allergen Reactions  . Vicodin [Hydrocodone-Acetaminophen] Nausea And Vomiting  . Morphine Rash  . Morphine And Related Rash    Allergic reaction only in iv form    HOME MEDICATIONS: Outpatient Prescriptions Prior to Visit  Medication Sig Dispense Refill  . Ascorbic Acid (VITAMIN C) 1000 MG tablet Take 1,000 mg by mouth daily.     . Calcium Carbonate-Vitamin D 600-400 MG-UNIT per tablet Take 1 tablet by mouth daily.     [name] DIAZEPAM RE Place 20 mg rectally. Rectally as needed for seizure lasting longer than 2 minutes    . FLUoxetine (PROZAC) 40 MG capsule Take 1 capsule (40 mg total) by mouth daily. 30 capsule 2  . lacosamide (VIMPAT) 200 MG TABS tablet Take 1 tablet (200 mg total) by mouth 2 (two) times daily. 180 tablet 1  . levETIRAcetam (KEPPRA) 1000 MG tablet TAKE 2 TABLETS (2,000 MG TOTAL) BY MOUTH 2 (TWO) TIMES DAILY. 120 tablet 0  . levothyroxine (SYNTHROID, LEVOTHROID) 50 MCG tablet Take 50 mcg by mouth daily before breakfast.    . promethazine (PHENERGAN) 25 MG tablet Take 25 mg by mouth every 6 (six) hours as needed. nausea    . warfarin (COUMADIN) 5 MG tablet Take 5 mg by mouth 2 (two) times daily.    [name] lamoTRIgine (LAMICTAL) 200 MG tablet Take 200 mg by mouth 2 (two) times daily.  5   No facility-administered medications prior to visit.    PAST MEDICAL HISTORY: Past Medical History  Diagnosis Date  . Anxiety   .  Depression   . Seizures ([facility])   . CVA (cerebrovascular accident due to intracerebral hemorrhage) ([place]) 2004  . Arthritis   . MRSA (methicillin resistant Staphylococcus aureus)   . S/P BKA (below knee amputation) unilateral ([facility])     Right   . Thyroid disease   . 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 DVT (deep venous thrombosis)  ([facility])     Left leg    PAST SURGICAL HISTORY: Past Surgical History  Procedure Laterality Date  . Fracture surgery      Rt. femur, folllowed by MRSA  . Brain surgery  2004    cerebral aneurysm  . Total knee arthroplasty Right   . Above knee leg amputation Right     Osteosarcoma    FAMILY HISTORY: Family History  Problem Relation Age of Onset  . Anxiety disorder Mother   . Diabetes Father     SOCIAL HISTORY: Social History   Social History  . Marital Status: Legally Separated    Spouse Name: N/A  . Number of Children: 3  . Years of Education: GED   Occupational History  . Not on file.   Social History Main Topics  . Smoking status: Never Smoker   . Smokeless tobacco: Never Used     Comment: never used tobacco  . Alcohol Use: No  . Drug Use: No  . Sexual Activity: Not Currently   Other Topics Concern  . Not on file   Social History Narrative   Patient lives at home with mom.    Patient has GED.    Patient has 3 children.    Patient is married but in the process of divorced.            PHYSICAL EXAM  Filed Vitals:   01/29/15 1445  BP: 109/72  Pulse: 70  Height: 5\' 7"  (1.702 m)  Weight: 181 lb (82.101 kg)   Body mass index is 28.34 kg/(m^2). General: The patient is alert and cooperative at the time of the examination.  Skin: No significant peripheral edema is noted.  Neurologic Exam  Mental status: The patient is oriented x 3.  Cranial nerves: Facial symmetry is present. Speech is normal, no aphasia or dysarthria is noted. Extraocular movements are notable for restriction of superior gaze. On primary gaze, there is exotropia of the left eye. Visual fields are full.  Motor: The patient has good strength in all 4 extremities. (R above knee amp) Sensory examination: Soft touch sensation on the legs, arms, and face is symmetric  Coordination: The patient has good finger-nose-fingerbil  and heel-to-shin on the left.  Gait and station: The  patient has a Right above-knee amputation, no prosthesis. The patient could not be ambulated.  Reflexes: Deep tendon reflexes are symmetric in the arms, cannot get reflexes on the right leg secondary to above-knee amputation.    DIAGNOSTIC DATA (LABS, IMAGING, TESTING) - I reviewed patient records, labs, notes, testing and imaging myself where available.  Lab Results  Component Value Date   WBC 3.6* 11/04/2014   HGB 11.4* 11/04/2014   HCT 35.6 11/04/2014   MCV 90.1 11/04/2014   PLT 189 11/04/2014      Component Value Date/Time   NA 142 11/04/2014 1424   NA 138 07/11/2013 1346   NA 139 05/05/2013 1217   NA 139 10/24/2012 1031   K 3.9 11/04/2014 1424   K 3.6 07/11/2013 1346   K 3.9  05/05/2013 1217   CL 97* 07/11/2013 1346   CL 99 05/05/2013 1217   CO2 31* 11/04/2014 1424   CO2 31 07/11/2013 1346   CO2 23 02/22/2013 0455   GLUCOSE 96 11/04/2014 1424   GLUCOSE 126* 07/11/2013 1346   GLUCOSE 75 05/05/2013 1217   GLUCOSE 75 10/24/2012 1031   BUN 14.4 11/04/2014 1424   BUN 16 07/11/2013 1346   BUN 11 05/05/2013 1217   BUN 11 10/24/2012 1031   CREATININE 1.0 11/04/2014 1424   CREATININE 1.0 07/11/2013 1346   CREATININE 0.90 05/05/2013 1217   CALCIUM 10.0 11/04/2014 1424   CALCIUM 10.0 07/11/2013 1346   CALCIUM 8.8 02/22/2013 0455   CALCIUM 8.4 12/26/2009 1025   PROT 7.1 11/04/2014 1424   PROT 7.6 07/11/2013 1346   PROT 7.7 02/17/2013 0313   PROT 6.9 10/24/2012 1031   ALBUMIN 3.9 11/04/2014 1424   ALBUMIN 3.4 07/11/2013 1346   ALBUMIN 3.8 02/17/2013 0313   AST 21 11/04/2014 1424   AST 26 07/11/2013 1346   AST 26 02/17/2013 0313   ALT 27 11/04/2014 1424   ALT 27 07/11/2013 1346   ALT 33 02/17/2013 0313   ALKPHOS 90 11/04/2014 1424   ALKPHOS 102* 07/11/2013 1346   ALKPHOS 105 02/17/2013 0313   BILITOT 0.20 11/04/2014 1424   BILITOT 0.40 07/11/2013 1346   BILITOT 0.2* 02/17/2013 0313   GFRNONAA 75* 02/22/2013 0455   GFRAA 50* 02/22/2013 0455     ASSESSMENT AND  PLAN 49 y.o. year old female has a past medical history of Anxiety; Depression; Seizures; CVA (cerebrovascular accident due to intracerebral hemorrhage) (2004); MRSA (methicillin resistant Staphylococcus aureus); S/P BKA (below knee amputation) unilateral;  Unspecified epilepsy with intractable epilepsy (02/28/2013); and DVT (deep venous thrombosis). here to follow-up. Placement of vagal nerve stimulator 12/17/2013.   Continue Keppra thousand milligrams 2 tabs twice daily  Continue Lamictal 200mg   twice daily  Continued Vimpat 200 twice daily  We will get levels in the a.m. Seizure meds have been prescribed by Dr. Ginny Forth. Follow-up in 6 months Dennie Bible, [facility], [facility], APRN  [facility] [address] [place], [place] [address] [phone]

## 2015-01-30 ENCOUNTER — Other Ambulatory Visit (INDEPENDENT_AMBULATORY_CARE_PROVIDER_SITE_OTHER): Payer: Self-pay

## 2015-01-30 DIAGNOSIS — Z0289 Encounter for other administrative examinations: Secondary | ICD-10-CM

## 2015-02-01 LAB — LAMOTRIGINE LEVEL: Lamotrigine Lvl: 14.5 ug/mL (ref 2.0–20.0)

## 2015-02-01 LAB — LEVETIRACETAM LEVEL: Levetiracetam Lvl: 56.8 ug/mL — ABNORMAL HIGH (ref 10.0–40.0)

## 2015-02-19 ENCOUNTER — Ambulatory Visit (INDEPENDENT_AMBULATORY_CARE_PROVIDER_SITE_OTHER): Payer: Medicare Other | Admitting: Psychology

## 2015-02-19 DIAGNOSIS — F330 Major depressive disorder, recurrent, mild: Secondary | ICD-10-CM

## 2015-02-19 NOTE — Progress Notes (Signed)
   THERAPIST PROGRESS NOTE  Session Time: 2.35pm-3.20pm  Participation Level: Active  Behavioral Response: Well GroomedAlertaffect WNl  Type of Therapy: Individual Therapy  Treatment Goals addressed: Diagnosis: MDD and goal 1.  Interventions: CBT and Supportive  Summary: Cassandra Andrade is a 49 y.o. female who presents with affect WNL.  Pt reported that her mood has been ok. Pt reported that she enjoyed her visits from her son's this past week and time with them.  Pt also reported that she had a good Christmas w/ family.  pt spoke her identity as a parent and how they have grown up to be good adults and proud of them.  Pt made good use of humor in the session and discussed how this is important to her.     Suicidal/Homicidal: Nowithout intent/plan  Therapist Response: Assessed pt current functioning per pt report.  Processed w/pt her mood and interactions w/ family.  Explored her identity as parent past and present.  Discussed good use of humor for self care.   Plan: Return again in 4 weeks.  Diagnosis: MDD   Jan Fireman, [facility] 02/19/2015

## 2015-02-26 DIAGNOSIS — Z86718 Personal history of other venous thrombosis and embolism: Secondary | ICD-10-CM | POA: Diagnosis not present

## 2015-03-19 ENCOUNTER — Other Ambulatory Visit: Payer: Self-pay | Admitting: Nurse Practitioner

## 2015-03-19 ENCOUNTER — Other Ambulatory Visit (HOSPITAL_COMMUNITY): Payer: Self-pay | Admitting: Psychiatry

## 2015-03-24 ENCOUNTER — Ambulatory Visit (INDEPENDENT_AMBULATORY_CARE_PROVIDER_SITE_OTHER): Payer: Medicare Other | Admitting: Psychology

## 2015-03-24 ENCOUNTER — Other Ambulatory Visit (HOSPITAL_COMMUNITY): Payer: Self-pay | Admitting: Psychiatry

## 2015-03-24 DIAGNOSIS — F330 Major depressive disorder, recurrent, mild: Secondary | ICD-10-CM | POA: Diagnosis not present

## 2015-03-24 NOTE — Progress Notes (Signed)
   THERAPIST PROGRESS NOTE  Session Time: 2.40pm-3.25pm  Participation Level: Active  Behavioral Response: Well GroomedAlert, AFFECT WNL  Type of Therapy: Individual Therapy  Treatment Goals addressed: Diagnosis: MDD and goal 1.  Interventions: CBT and Supportive  Summary: Cassandra Andrade is a 50 y.o. female who presents with generally full and bright affect.  Pt reported that she has been doing well.  Scores on PHq9 1.  Pt reported that she has been focusing on staying "unoffended" by what others say- instead of dwelling or responding back.  Pt reports that others in her life can suggest that she isn't "as far along as she should be" walking etc.  And this can be discouraging if she lets it.  Pt reports that she is focusing on verbalizing things positively and taking day at a time.    Suicidal/Homicidal: Nowithout intent/plan  Therapist Response: Assessed pt current functioning per pt report.  Processed w/pt her report of mood and discussed use of positive self talk and statements power w/ mood.  Explored how to assert self when needed.    Plan: Return again in 1 month .  Diagnosis: MDD    Jan Fireman, [facility] 03/24/2015

## 2015-03-25 ENCOUNTER — Ambulatory Visit (INDEPENDENT_AMBULATORY_CARE_PROVIDER_SITE_OTHER): Payer: Medicare Other | Admitting: Psychiatry

## 2015-03-25 ENCOUNTER — Encounter (HOSPITAL_COMMUNITY): Payer: Self-pay | Admitting: Psychiatry

## 2015-03-25 VITALS — BP 108/64 | HR 81 | Ht 67.5 in | Wt 191.6 lb

## 2015-03-25 DIAGNOSIS — R45851 Suicidal ideations: Secondary | ICD-10-CM | POA: Diagnosis not present

## 2015-03-25 DIAGNOSIS — F321 Major depressive disorder, single episode, moderate: Secondary | ICD-10-CM

## 2015-03-25 MED ORDER — FLUOXETINE HCL 40 MG PO CAPS: 40.0000 mg | ORAL_CAPSULE | Freq: Every day | ORAL | Status: DC

## 2015-03-25 NOTE — Progress Notes (Signed)
[place] Progress Note  Cassandra Andrade [id number] 50 y.o.  03/25/2015 5:08 PM  Chief Complaint:  Medication management and follow-up.        History of Present Illness: Cassandra Andrade came for her followup appointment with her mother.   She is compliant with her Prozac.  She denies any side effects.  She had a good Christmas.  She is not using prosthesis more frequently and comfortable walking with them.  She denies any panic attack or any feeling of hopelessness or worthlessness.  She denies any crying spells.  Her mother is very supportive.  She continues to have seizure and recently she's seen neurologist for checkup.  There were no changes.  Patient denies any paranoia or any hallucination.  She sleeping good.  She denies any mania or psychosis.  Her energy level is better.  She denies drinking or using any illegal substances.  Her appetite is okay.  Her vitals are stable.  She lives with her mother who is very supportive.  Suicidal Ideation: Yes Plan Formed: No Patient has means to carry out plan: No  Homicidal Ideation: No Plan Formed: No Patient has means to carry out plan: No  Review of Systems  Constitutional: Negative.   HENT: Negative.   Musculoskeletal:       Below-knee amputation and right leg  Skin: Negative.   Neurological: Positive for tingling.  Psychiatric/Behavioral: Negative.     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  She has history of MRSA resulted right knee amputation.  She has hypothyroidism, chronic neuropathy pain , headache and seizure disorder.  She see Dr. [name]  for seizures.  Her primary care is [name] at [facility].    Outpatient Encounter Prescriptions as of 03/25/2015  Medication Sig  . Ascorbic Acid (VITAMIN C) 1000 MG tablet Take 1,000 mg by mouth daily.   . Calcium Carbonate-Vitamin D 600-400 MG-UNIT per tablet Take 1 tablet by mouth daily.   [name] DIAZEPAM RE Place 20 mg rectally. Rectally as needed for seizure lasting longer than 2 minutes  . FLUoxetine (PROZAC) 40 MG capsule Take 1 capsule (40 mg total) by mouth daily.  [name] lacosamide (VIMPAT) 200 MG TABS tablet Take 1 tablet (200 mg total) by mouth 2 (two) times daily.  [name] lamoTRIgine (LAMICTAL) 200 MG tablet Take 200 mg by mouth 2 (two) times daily.  [name] levETIRAcetam (KEPPRA) 1000 MG tablet TAKE 2 TABLETS (2,000 MG TOTAL) BY MOUTH 2 (TWO) TIMES DAILY.  [name] levothyroxine (SYNTHROID, LEVOTHROID) 50 MCG tablet Take 50 mcg by mouth daily before breakfast.  . promethazine (PHENERGAN) 25 MG tablet Take 25 mg by mouth every 6 (six) hours as needed. nausea  . warfarin (COUMADIN) 5 MG tablet Take 5 mg by mouth 2 (two) times daily.  . [DISCONTINUED] FLUoxetine (PROZAC) 40 MG capsule Take 1 capsule (40 mg total) by mouth daily.   No facility-administered encounter medications on file as of 03/25/2015.   Past Psychiatric History/Hospitalization(s): Patient has at least one psychiatric admission in 2011 due to overdose on her pain medication.  At that time she has a marital conflict and having argument with her husband.  She denies any history of mania  psychosis hallucination.  In the past she has given Zoloft and Remeron however she do not remember the details.  She remembered taking Lexapro and Abilify but it was stopped because lack of response.   Anxiety: Yes Bipolar Disorder: No Depression: Yes Mania: No Psychosis: No Schizophrenia: No Personality Disorder: No Hospitalization for psychiatric illness: Yes History of Electroconvulsive Shock Therapy: No Prior Suicide  Attempts: Yes  Physical Exam: Constitutional:  BP 108/64 mmHg  Pulse 81  Ht 5' 7.5" (1.715 m)  Wt 191 lb 9.6 oz (86.909 kg)  BMI 29.55 kg/m2  General Appearance: alert, oriented, no acute distress and well nourished  Recent Results (from the past 2160 hour(s))  Lamotrigine level     Status: None   Collection Time: 01/30/15  8:52 AM  Result Value Ref Range   Lamotrigine Lvl 14.5 2.0 - 20.0 ug/mL    Comment:                                 Detection Limit = 1.0  Levetiracetam level     Status: Abnormal   Collection Time: 01/30/15  8:52 AM  Result Value Ref Range   Levetiracetam Lvl 56.8 (H) 10.0 - 40.0 ug/mL    Musculoskeletal: Strength & Muscle Tone: decreased Gait & Station: Patient is wheelchair-bound.  She has the right below-knee amputation. Patient leans: See above  Mental Status Examination:  patient is casually dressed and groomed.  She uses wheelchair because of leg amputation.  She maintained fair eye contact.  Her speech is fast but clear and coherent.  She described her mood anxious and her affect is appropriate.  She denies any auditory or visual hallucination.  She denies any active or passive suicidal thoughts or homicidal thought.  Her attention consultation is fair.  There were no delusions, paranoia or any obsessive thoughts.  Her attention and concentration is fair.  She's alert and oriented 3.  Her psychomotor activity is normal.  Her fund of knowledge is adequate.  Her insight judgment and impulse control is okay.  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  She is seeing [name] for counseling.  Discussed medication side  effects and benefits.  Recommended to call us back if she has any question or any concern.  She is taking Lamictal , vimpat and Keppra from her neurologist.  I will see her again in 3 months.   Keymora Grillot T., MD 03/25/2015

## 2015-03-26 DIAGNOSIS — Z86718 Personal history of other venous thrombosis and embolism: Secondary | ICD-10-CM | POA: Diagnosis not present

## 2015-04-16 DIAGNOSIS — Z7901 Long term (current) use of anticoagulants: Secondary | ICD-10-CM | POA: Diagnosis not present

## 2015-04-16 DIAGNOSIS — I749 Embolism and thrombosis of unspecified artery: Secondary | ICD-10-CM | POA: Diagnosis not present

## 2015-04-23 DIAGNOSIS — Z86718 Personal history of other venous thrombosis and embolism: Secondary | ICD-10-CM | POA: Diagnosis not present

## 2015-04-28 DIAGNOSIS — G40909 Epilepsy, unspecified, not intractable, without status epilepticus: Secondary | ICD-10-CM | POA: Diagnosis not present

## 2015-04-28 DIAGNOSIS — Z9689 Presence of other specified functional implants: Secondary | ICD-10-CM | POA: Diagnosis not present

## 2015-04-28 DIAGNOSIS — G40219 Localization-related (focal) (partial) symptomatic epilepsy and epileptic syndromes with complex partial seizures, intractable, without status epilepticus: Secondary | ICD-10-CM | POA: Diagnosis not present

## 2015-04-28 DIAGNOSIS — I6789 Other cerebrovascular disease: Secondary | ICD-10-CM | POA: Diagnosis not present

## 2015-04-28 DIAGNOSIS — R6889 Other general symptoms and signs: Secondary | ICD-10-CM | POA: Diagnosis not present

## 2015-04-28 DIAGNOSIS — Z7901 Long term (current) use of anticoagulants: Secondary | ICD-10-CM | POA: Diagnosis not present

## 2015-04-30 ENCOUNTER — Ambulatory Visit (HOSPITAL_COMMUNITY): Payer: Self-pay | Admitting: Psychology

## 2015-05-05 ENCOUNTER — Other Ambulatory Visit (HOSPITAL_BASED_OUTPATIENT_CLINIC_OR_DEPARTMENT_OTHER): Payer: Medicare Other

## 2015-05-05 ENCOUNTER — Telehealth: Payer: Self-pay | Admitting: Hematology

## 2015-05-05 ENCOUNTER — Encounter: Payer: Self-pay | Admitting: Hematology

## 2015-05-05 ENCOUNTER — Ambulatory Visit (HOSPITAL_BASED_OUTPATIENT_CLINIC_OR_DEPARTMENT_OTHER): Payer: Medicare Other | Admitting: Hematology

## 2015-05-05 VITALS — BP 102/58 | HR 79 | Temp 97.5°F | Resp 18 | Ht 67.5 in | Wt 178.6 lb

## 2015-05-05 DIAGNOSIS — I82513 Chronic embolism and thrombosis of femoral vein, bilateral: Secondary | ICD-10-CM | POA: Diagnosis not present

## 2015-05-05 DIAGNOSIS — F330 Major depressive disorder, recurrent, mild: Secondary | ICD-10-CM

## 2015-05-05 DIAGNOSIS — R944 Abnormal results of kidney function studies: Secondary | ICD-10-CM | POA: Diagnosis not present

## 2015-05-05 DIAGNOSIS — D6862 Lupus anticoagulant syndrome: Secondary | ICD-10-CM

## 2015-05-05 DIAGNOSIS — Z86718 Personal history of other venous thrombosis and embolism: Secondary | ICD-10-CM

## 2015-05-05 DIAGNOSIS — Z85830 Personal history of malignant neoplasm of bone: Secondary | ICD-10-CM

## 2015-05-05 DIAGNOSIS — D649 Anemia, unspecified: Secondary | ICD-10-CM | POA: Diagnosis present

## 2015-05-05 LAB — CBC & DIFF AND RETIC
BASO%: 0.3 % (ref 0.0–2.0)
BASOS ABS: 0 10*3/uL (ref 0.0–0.1)
EOS%: 1.4 % (ref 0.0–7.0)
Eosinophils Absolute: 0.1 10*3/uL (ref 0.0–0.5)
HEMATOCRIT: 38.7 % (ref 34.8–46.6)
HEMOGLOBIN: 12.5 g/dL (ref 11.6–15.9)
Immature Retic Fract: 3.6 % (ref 1.60–10.00)
LYMPH#: 1.8 10*3/uL (ref 0.9–3.3)
LYMPH%: 48.8 % (ref 14.0–49.7)
MCH: 28.7 pg (ref 25.1–34.0)
MCHC: 32.3 g/dL (ref 31.5–36.0)
MCV: 88.8 fL (ref 79.5–101.0)
MONO#: 0.3 10*3/uL (ref 0.1–0.9)
MONO%: 7.1 % (ref 0.0–14.0)
NEUT#: 1.6 10*3/uL (ref 1.5–6.5)
NEUT%: 42.4 % (ref 38.4–76.8)
NRBC: 0 % (ref 0–0)
Platelets: 210 10*3/uL (ref 145–400)
RBC: 4.36 10*6/uL (ref 3.70–5.45)
RDW: 13.8 % (ref 11.2–14.5)
RETIC %: 1.07 % (ref 0.70–2.10)
RETIC CT ABS: 46.65 10*3/uL (ref 33.70–90.70)
WBC: 3.7 10*3/uL — ABNORMAL LOW (ref 3.9–10.3)

## 2015-05-05 LAB — COMPREHENSIVE METABOLIC PANEL
ALT: 27 U/L (ref 0–55)
AST: 23 U/L (ref 5–34)
Albumin: 4 g/dL (ref 3.5–5.0)
Alkaline Phosphatase: 113 U/L (ref 40–150)
Anion Gap: 9 mEq/L (ref 3–11)
BUN: 13.1 mg/dL (ref 7.0–26.0)
CALCIUM: 10.1 mg/dL (ref 8.4–10.4)
CHLORIDE: 100 meq/L (ref 98–109)
CO2: 31 mEq/L — ABNORMAL HIGH (ref 22–29)
CREATININE: 1.2 mg/dL — AB (ref 0.6–1.1)
EGFR: 62 mL/min/{1.73_m2} — ABNORMAL LOW (ref >90)
GLUCOSE: 102 mg/dL (ref 70–140)
POTASSIUM: 3.5 meq/L (ref 3.5–5.1)
SODIUM: 139 meq/L (ref 136–145)
Total Bilirubin: <0.3 mg/dL (ref 0.20–1.20)
Total Protein: 7.9 g/dL (ref 6.4–8.3)

## 2015-05-05 NOTE — Telephone Encounter (Signed)
Gave and pritned appt sched and avs for pt for NOV °

## 2015-05-05 NOTE — Progress Notes (Signed)
[facility]  [phone] [phone]) [phone]  HEMATOLOGY CLINIC FOLLOW UP VISIT    ID: Cassandra Andrade OB: [birth year] MR#: [id number] Patient Care Team: Junie Panning, NP as PCP - General (Nurse Practitioner) Kathlee Nations, MD as Consulting Physician (Psychiatry)  REASON FOR OFFICE VISIT:  1. Bilateral lower extremity deep venous thrombosis.  2. Right below-knee amputation for osteogenic sarcoma.  3. Multiple miscarriages.  4. Positive lupus anticoagulant, repeated negative   PATIENT IDENTIFICATION  Cassandra Andrade is a pleasant 50 years old African-American female who is coming to established here at [place]. Previously she was being seen by Dr. Burney Gauze at [facility] location. 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 She had radiation and chemotherapy. Should the surgery with the bone prosthesis. 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 It also causes her some memory problems. She was in coma for a while and she has a tracheostomy done and was placed in a long-term care facility. She has history of multiple miscarriages. She does have 3 children. She also had a right AKA because of the right lower leg infection Iin2013. 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 She was initially evaluated by Dr. Burney Gauze on 12/01/2012. She is wheelchair bound. She also have history of situational depression, iron deficiency anemia and hypothyroidism. 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 He also takes antiseizure therapy is no known family history of thromboembolic problems. She had couple of venous duplex studies done last one was on 09/19/2013 showing similar appearing residual chronic thrombus within the common femoral and femoral veins bilaterally. No significant interval change from 04/04/2013  patient's mother have a machine at home where they check her INR and the target INR is between 2-3. Her current Coumadin dose 10 mg daily and the results are being called out to physician who regulates the Coumadin. Patient and her mother would like to continue the same arrangement although I offered her that we have a dedicated Coumadin clinic and we can help in Coumadin regulation if they desire. Because of a positive lupus anticoagulant, a prior history of osteosarcoma, generalized deconditioning and wheelchair bound status, and presence of chronic thrombus on vascular imaging, Dr. Martha Clan recommended lifelong Coumadin.  INTERIM HISTORY: Cassandra Andrade is back today with her mother for a follow-up. She is slightly more active than before, she works for 30 minutes, twice a day, uses a walker sometime, she is able to be more independent on taking care of herself, less dependent on her mother. She tolerating Coumadin well, no episodes of bleeding. Her INR was 3.7 last week, and Coumadin dose was slightly decreased. She also follows with her psychiatrist for depression, was to discuss her Prozac with her psychiatrist on coming up visit this week. No other new complaints.   CURRENT TREATMENT: Coumadin 5mg  on MWF and 7.5mg  on the rest of week, to maintain INR between 2-3  REVIEW OF SYSTEMS: All other 10 point review of systems is negative.    PAST MEDICAL HISTORY: Past Medical History  Diagnosis Date  . Anxiety   . Depression   . Seizures ([facility])   . CVA (cerebrovascular accident due to intracerebral hemorrhage) ([facility]) 2004  . Arthritis   . MRSA (methicillin resistant Staphylococcus aureus)   . S/P BKA (below knee  amputation) unilateral ([facility])     Right   . Thyroid disease   . 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 DVT (deep venous thrombosis) ([facility])     Left leg   PAST SURGICAL HISTORY: Past Surgical History  Procedure Laterality Date   . Fracture surgery      Rt. femur, folllowed by MRSA  . Brain surgery  2004    cerebral aneurysm  . Total knee arthroplasty Right   . Above knee leg amputation Right     Osteosarcoma   FAMILY HISTORY Family History  Problem Relation Age of Onset  . Anxiety disorder Mother   . Diabetes Father    GYNECOLOGIC HISTORY:  No LMP recorded. Patient has had an ablation.   SOCIAL HISTORY:  Social History   Social History  . Marital Status: Legally Separated    Spouse Name: N/A  . Number of Children: 3  . Years of Education: GED   Occupational History  . Not on file.   Social History Main Topics  . Smoking status: Never Smoker   . Smokeless tobacco: Never Used     Comment: never used tobacco  . Alcohol Use: No  . Drug Use: No  . Sexual Activity: Not Currently   Other Topics Concern  . Not on file   Social History Narrative   Patient lives at home with mom.    Patient has GED.    Patient has 3 children.    Patient is married but in the process of divorced.          ADVANCED DIRECTIVES: <no information>  HEALTH MAINTENANCE: Social History  Substance Use Topics  . Smoking status: Never Smoker   . Smokeless tobacco: Never Used     Comment: never used tobacco  . Alcohol Use: No   Colonoscopy: PAP: Bone density: Lipid panel:  Allergies  Allergen Reactions  . Vicodin [Hydrocodone-Acetaminophen] Nausea And Vomiting  . Morphine Rash  . Morphine And Related Rash    Allergic reaction only in iv form    Current Outpatient Prescriptions  Medication Sig Dispense Refill  . Ascorbic Acid (VITAMIN C) 1000 MG tablet Take 1,000 mg by mouth daily.     . Calcium Carbonate-Vitamin D 600-400 MG-UNIT per tablet Take 1 tablet by mouth daily.     [name] DIAZEPAM RE Place 20 mg rectally. Rectally as needed for seizure lasting longer than 2 minutes    . FLUoxetine (PROZAC) 40 MG capsule Take 1 capsule (40 mg total) by mouth daily. 30 capsule 2  . lacosamide (VIMPAT) 200 MG TABS  tablet Take 1 tablet (200 mg total) by mouth 2 (two) times daily. 180 tablet 1  . levETIRAcetam (KEPPRA) 1000 MG tablet TAKE 2 TABLETS (2,000 MG TOTAL) BY MOUTH 2 (TWO) TIMES DAILY. 120 tablet 1  . levothyroxine (SYNTHROID, LEVOTHROID) 50 MCG tablet Take 50 mcg by mouth daily before breakfast.    . promethazine (PHENERGAN) 25 MG tablet Take 25 mg by mouth every 6 (six) hours as needed. nausea    . warfarin (COUMADIN) 5 MG tablet Take 5 mg by mouth 2 (two) times daily. Presently taking alternating dose 1 & 1/2 tab x 4 days & 1 tab x 3 days     No current facility-administered medications for this visit.   OBJECTIVE: Filed Vitals:   05/05/15 1414  BP: 102/58  Pulse: 79  Temp: 97.5 F (36.4 C)  Resp: 18  Body mass index is 27.54 kg/(m^2). ECOG FS: 3 Ocular: Sclerae unicteric, pupils equal, round and reactive to light Ear-nose-throat: Oropharynx clear, dentition fair Lymphatic: No cervical or supraclavicular adenopathy Lungs no rales or rhonchi, good excursion bilaterally Heart regular rate and rhythm, no murmur appreciated Abd soft, nontender, positive bowel sounds MSK no focal spinal tenderness, no joint edema. (+) right AKA Neuro: non-focal, well-oriented, appropriate affect Breasts: Deferred  LAB RESULTS: CBC Latest Ref Rng 05/05/2015 11/04/2014 07/04/2014  WBC 3.9 - 10.3 10e3/uL 3.7(L) 3.6(L) 3.2(L)  Hemoglobin 11.6 - 15.9 g/dL 12.5 11.4(L) 12.1  Hematocrit 34.8 - 46.6 % 38.7 35.6 37.8  Platelets 145 - 400 10e3/uL 210 189 181    CMP Latest Ref Rng 11/04/2014 07/04/2014 07/11/2013  Glucose 70 - 140 mg/dl 96 83 126(H)  BUN 7.0 - 26.0 mg/dL 14.4 19.5 16  Creatinine 0.6 - 1.1 mg/dL 1.0 1.0 1.0  Sodium 136 - 145 mEq/L 142 143 138  Potassium 3.5 - 5.1 mEq/L 3.9 4.0 3.6  Chloride 98 - 108 mEq/L - - 97(L)  CO2 22 - 29 mEq/L 31(H) 29 31  Calcium 8.4 - 10.4 mg/dL 10.0 9.7 10.0  Total Protein 6.4 - 8.3 g/dL 7.1 7.2 7.6  Total Bilirubin 0.20 - 1.20 mg/dL 0.20 0.27 0.40  Alkaline Phos 40  - 150 U/L 90 96 102(H)  AST 5 - 34 U/L 21 23 26   ALT 0 - 55 U/L 27 25 27     STUDIES: US Venous Img Lower Bilateral  09/19/2013   CLINICAL DATA:  Chronic bilateral DVT  EXAM: BILATERAL LOWER EXTREMITY VENOUS DOPPLER ULTRASOUND  TECHNIQUE: Gray-scale sonography with graded compression, as well as color Doppler and duplex ultrasound were performed to evaluate the lower extremity deep venous systems from the level of the common femoral vein and including the common femoral, femoral, profunda femoral, popliteal and calf veins including the posterior tibial, peroneal and gastrocnemius veins when visible. The superficial great saphenous vein was also interrogated. Spectral Doppler was utilized to evaluate flow at rest and with distal augmentation maneuvers in the common femoral, femoral and popliteal veins.  COMPARISON:  None.  FINDINGS: Deep veins: Bilateral lower extremity deep veins demonstrate similar recanalized thrombus with mural thrombus seen throughout both common femoral veins and the femoral veins of the thigh. No significant popliteal or tibial thrombus is identified on the left. The patient is status post amputation of the right lower leg.  Compressibility of deep veins: Partially compressible.  Duplex waveform respiratory phasicity:  Diminished but Normal.  Augmentation not performed.  Venous reflux: None visualized.  Other findings: No evidence of superficial thrombophlebitis or abnormal fluid collection.  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 No significant interval change.   Electronically Signed   By: Daryll Brod M.D.   On: 09/19/2013 12:26    ASSESSMENT/PLAN:  Cassandra Andrade is a 50 year old African American female.  1. History of bilateral DVT, transient Lupus anticoagulant positive, repeat tests were negative. -Her repeated Doppler showed chronic-appearing DVT in July 2015, which has not changed much. -She is  wheelchair bound due to her right lower extremity amputation above knee. She has certainly high risks for thrombosis due to her immobilization.  -I think she likely need long-term anticoagulation, she agrees with the plan. She asked about other anticoagulation options such a Xeralto, and the pros and cons of Coumadin and Xeralto, especially their reversibility. After the extensive discussion, she decided to stay on Coumadin.  -She  is following up with her primary care physician for Coumadin clinic. The patient and her mother still prefer to follow up with me for long-term anticoagulation management.  -I encouraged her to be more physically active as she tolerates, she is doing better overall, less dependent on her mother.   2. Right lower extremity osteosarcoma, status post amputation above knee. -Doing well, no evidence of recurrence. She uses prosthesis.  3. Mild anemia -Her hemoglobin was 11.4 in 10/2014, Iron study reviewed and normal ferritin and serum iron level. Her transferrin saturation is slightly low. -Anemia resolved, hemoglobin 12.5 today.  4. Slightly elevated Cr -Cr 1.2 today, she does not have diabetes or hypertension. -I encouraged her to drink more fluids, keep her hydration. Await NSAIDs  Follow-up:  - she will continue Coumadin, and follow-up with her primary care physician for Coumadin monitoring and adjustment  - I'll see her back in 8 months with lab CBC and CMP.   All questions were answered and she is in agreement with the plan. She knows to call here with any questions or concerns and to go to the ED in the event of an emergency. We can certainly see her sooner if need be.   Truitt Merle  05/05/2015

## 2015-05-07 ENCOUNTER — Ambulatory Visit (HOSPITAL_COMMUNITY): Payer: Self-pay | Admitting: Psychology

## 2015-05-07 ENCOUNTER — Encounter (HOSPITAL_COMMUNITY): Payer: Self-pay | Admitting: Psychology

## 2015-05-07 DIAGNOSIS — I749 Embolism and thrombosis of unspecified artery: Secondary | ICD-10-CM | POA: Diagnosis not present

## 2015-05-07 DIAGNOSIS — Z7901 Long term (current) use of anticoagulants: Secondary | ICD-10-CM | POA: Diagnosis not present

## 2015-05-07 NOTE — Progress Notes (Signed)
Cassandra Andrade is a 49 y.o. female patient who didn't show for her appointment.  Letter sent.        Desi Carby, LPC 

## 2015-05-21 DIAGNOSIS — Z86718 Personal history of other venous thrombosis and embolism: Secondary | ICD-10-CM | POA: Diagnosis not present

## 2015-05-22 DIAGNOSIS — Z1231 Encounter for screening mammogram for malignant neoplasm of breast: Secondary | ICD-10-CM | POA: Diagnosis not present

## 2015-05-22 DIAGNOSIS — Z7189 Other specified counseling: Secondary | ICD-10-CM | POA: Diagnosis not present

## 2015-05-22 DIAGNOSIS — E039 Hypothyroidism, unspecified: Secondary | ICD-10-CM | POA: Diagnosis not present

## 2015-06-11 ENCOUNTER — Ambulatory Visit (INDEPENDENT_AMBULATORY_CARE_PROVIDER_SITE_OTHER): Payer: Medicare Other | Admitting: Psychology

## 2015-06-11 DIAGNOSIS — F329 Major depressive disorder, single episode, unspecified: Secondary | ICD-10-CM

## 2015-06-11 DIAGNOSIS — F32A Depression, unspecified: Secondary | ICD-10-CM

## 2015-06-11 NOTE — Progress Notes (Signed)
   THERAPIST PROGRESS NOTE  Session Time: 2.35pm-3.17pm  Participation Level: Active  Behavioral Response: Well GroomedAlertDepressed  Type of Therapy: Individual Therapy  Treatment Goals addressed: Diagnosis: MDD and goal 1.  Interventions: CBT and Strength-based  Summary: Cassandra Andrade is a 50 y.o. female who presents with affect congruent w/ depressed mood.  Pt reported for the past week noticed increased depressed mood and feeling discouraged and as if a burden.  Pt reported that no new stressor- just bothered by what she sees as lack of progress and losses since stroke. Pt self talk was negative- calling self hideous- but increased awareness of this w/ counselor assistance.  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  Pt identified that having a positive of the day to post on social media would help, to identify a class for personal enrichment would be helpful.  Pt asked for print out of calendar to chart mood and use for planning.  Suicidal/Homicidal: Nowithout intent/plan  Therapist Response: Assessed pt current functioning per pt report.  Explored w/pt potential contributing factors to depressed mood increase- reflected negative self talk and distortions that might be contributing and assisted pt w/ reframing and finding facts against.  Discussed engaging a personal enrichment class through [place] or finding another community resource to increase want for learning and purpose.    Plan: Return again in 3 weeks.  Diagnosis: MDD   Jan Fireman, [facility] 06/11/2015

## 2015-06-18 DIAGNOSIS — Z86718 Personal history of other venous thrombosis and embolism: Secondary | ICD-10-CM | POA: Diagnosis not present

## 2015-06-25 ENCOUNTER — Encounter (HOSPITAL_COMMUNITY): Payer: Self-pay | Admitting: Psychiatry

## 2015-06-25 ENCOUNTER — Ambulatory Visit (INDEPENDENT_AMBULATORY_CARE_PROVIDER_SITE_OTHER): Payer: Medicare Other | Admitting: Psychiatry

## 2015-06-25 DIAGNOSIS — F321 Major depressive disorder, single episode, moderate: Secondary | ICD-10-CM | POA: Diagnosis not present

## 2015-06-25 DIAGNOSIS — R45851 Suicidal ideations: Secondary | ICD-10-CM

## 2015-06-25 MED ORDER — VENLAFAXINE HCL ER 37.5 MG PO CP24: 37.5000 mg | ORAL_CAPSULE | Freq: Two times a day (BID) | ORAL | Status: DC

## 2015-06-25 NOTE — Patient Instructions (Addendum)
Take Prozac every other day for next 5 days and than stop. Start Effexor 37.5 mg daily for 5 days and than twice daily. Call us back if any question.

## 2015-06-25 NOTE — Progress Notes (Signed)
[facility] [id number] Progress Note  Cassandra Andrade [id number] 50 y.o.  06/25/2015 3:03 PM  Chief Complaint:  I don't think my medicine is working.  I'm getting more depressed.          History of Present Illness: Cassandra Andrade came for her followup appointment.  She is taking Prozac however she reporting that it is not helping her depression.  She feels very sad, isolated, withdrawn.  She feels excessive guilty because her mother is helping her and sometimes she feels burden to her.  She has no other family member or any friend to help her.  Patient requires help in her ADLs.  She has amputation.  She admitted recently poor sleep, racing thoughts.  She admitted sometime feeling hopeless helpless but denies any active or passive suicidal thoughts.  Her energy level is low.  Her appetite is fair.  She has no tremors or shakes.  In the past she had taken Abilify, Lexapro, Zoloft and Remeron.  She is also taking Coumadin which may cause interaction with Prozac.  Patient denies any mania, psychosis or any self abusive behavior.  Patient denies drinking alcohol or using any illegal substances.  She lives with the mother who also supervises her medication.  Recently she's seen by oncologist and blood work was drawn.  Her CBC and basic chemistry is normal.   Suicidal Ideation: Yes Plan Formed: No Patient has means to carry out plan: No  Homicidal Ideation: No Plan Formed: No Patient has means to carry out plan: No  Review of Systems  Constitutional: Negative.   HENT: Negative.   Musculoskeletal:       Below-knee amputation and right leg  Skin: Negative.   Neurological: Positive for tingling.  Psychiatric/Behavioral: The patient is nervous/anxious and has insomnia.     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  She has history of MRSA resulted right knee amputation.  She has hypothyroidism, chronic neuropathy pain , headache and seizure disorder.  She see Dr. Anne Hahn for seizures.  Her primary care is Duayne Cal at [facility].    Outpatient Encounter Prescriptions as of 06/25/2015  Medication Sig  . Ascorbic Acid (VITAMIN C) 1000 MG tablet Take 1,000 mg by mouth daily.   . Calcium Carbonate-Vitamin D 600-400 MG-UNIT per tablet Take 1 tablet by mouth daily.   [name] DIAZEPAM RE Place 20 mg rectally. Rectally as needed for seizure lasting longer than 2 minutes  . lacosamide (VIMPAT) 200 MG TABS tablet Take 1 tablet (200 mg total) by mouth 2 (two) times daily.  [name] lamoTRIgine (LAMICTAL) 200 MG tablet Take 200 mg by mouth 2 (two) times daily.  [name] levETIRAcetam (KEPPRA) 1000 MG tablet TAKE 2 TABLETS (2,000 MG TOTAL) BY MOUTH 2 (TWO) TIMES DAILY.  [name] levothyroxine (SYNTHROID, LEVOTHROID) 50 MCG tablet Take 50 mcg by mouth daily before breakfast.  . promethazine (PHENERGAN) 25 MG tablet Take 25 mg by mouth every 6 (six) hours as needed. nausea  . venlafaxine XR (EFFEXOR XR) 37.5 MG 24 hr capsule Take 1 capsule (37.5 mg total) by mouth 2 (two) times daily.  [name] warfarin (COUMADIN) 5 MG tablet Take 5 mg by mouth 2 (two) times daily.  Presently taking alternating dose 1 & 1/2 tab x 4 days & 1 tab x 3 days  . [DISCONTINUED] FLUoxetine (PROZAC) 40 MG capsule Take 1 capsule (40 mg total) by mouth daily.   No facility-administered encounter medications on file as of 06/25/2015.   Past Psychiatric History/Hospitalization(s): Patient has at least one psychiatric admission in 2011 due to overdose on her pain medication.  At that time she has a marital conflict and having argument with her husband.  She denies any  history of mania psychosis hallucination.  In the past she has given Zoloft and Remeron however she do not remember the details.  She remembered taking Lexapro and Abilify but it was stopped because lack of response.   Anxiety: Yes Bipolar Disorder: No Depression: Yes Mania: No Psychosis: No Schizophrenia: No Personality Disorder: No Hospitalization for psychiatric illness: Yes History of Electroconvulsive Shock Therapy: No Prior Suicide Attempts: Yes  Physical Exam: Constitutional:  There were no vitals taken for this visit.  General Appearance: alert, oriented, no acute distress and well nourished  Recent Results (from the past 2160 hour(s))  CBC & Diff and Retic     Status: Abnormal   Collection Time: 05/05/15  1:54 PM  Result Value Ref Range   WBC 3.7 (L) 3.9 - 10.3 10e3/uL   NEUT# 1.6 1.5 - 6.5 10e3/uL   HGB 12.5 11.6 - 15.9 g/dL   HCT 38.7 34.8 - 46.6 %   Platelets 210 145 - 400 10e3/uL   MCV 88.8 79.5 - 101.0 fL   MCH 28.7 25.1 - 34.0 pg   MCHC 32.3 31.5 - 36.0 g/dL   RBC 4.36 3.70 - 5.45 10e6/uL   RDW 13.8 11.2 - 14.5 %   lymph# 1.8 0.9 - 3.3 10e3/uL   MONO# 0.3 0.1 - 0.9 10e3/uL   Eosinophils Absolute 0.1 0.0 - 0.5 10e3/uL   Basophils Absolute 0.0 0.0 - 0.1 10e3/uL   NEUT% 42.4 38.4 - 76.8 %   LYMPH% 48.8 14.0 - 49.7 %   MONO% 7.1 0.0 - 14.0 %   EOS% 1.4 0.0 - 7.0 %   BASO% 0.3 0.0 - 2.0 %   nRBC 0 0 - 0 %   Retic % 1.07 0.70 - 2.10 %   Retic Ct Abs 46.65 33.70 - 90.70 10e3/uL   Immature Retic Fract 3.60 1.60 - 10.00 %  Comprehensive metabolic panel     Status: Abnormal   Collection Time: 05/05/15  1:54 PM  Result Value Ref Range   Sodium 139 136 - 145 mEq/L   Potassium 3.5 3.5 - 5.1 mEq/L   Chloride 100 98 - 109 mEq/L   CO2 31 (H) 22 - 29 mEq/L   Glucose 102 70 - 140 mg/dl    Comment: Glucose reference range is for nonfasting patients. Fasting glucose reference range is 70- 100.   BUN 13.1 7.0 - 26.0 mg/dL   Creatinine 1.2 (H) 0.6 - 1.1 mg/dL   Total  Bilirubin <0.30 0.20 - 1.20 mg/dL   Alkaline Phosphatase 113 40 - 150 U/L   AST 23 5 - 34 U/L   ALT 27 0 - 55 U/L   Total Protein 7.9 6.4 - 8.3 g/dL   Albumin 4.0 3.5 - 5.0 g/dL   Calcium 10.1 8.4 - 10.4 mg/dL   Anion Gap 9 3 - 11 mEq/L   EGFR 62 (L) >90 ml/min/1.73 m2    Comment: eGFR is calculated using the CKD-EPI Creatinine Equation (2009)    Musculoskeletal: Strength &  Muscle Tone: decreased Gait & Station: Patient is wheelchair-bound.  She has the right below-knee amputation. Patient leans: See above  Mental Status Examination:  patient is casually dressed and groomed.  She maintained fair eye contact.  She described her mood sad depressed and anxious.  Her affect is constricted.  Her speech is very slow, decreased volume but coherent.  She uses wheelchair because of leg amputation.  She denies any auditory or visual hallucination.  She denies any active or passive suicidal thoughts or homicidal thought.  Her attention consultation is fair.  There were no delusions, paranoia or any obsessive thoughts.  Her attention and concentration is fair.  She's alert and oriented 3.  Her psychomotor activity is normal.  Her fund of knowledge is adequate.  Her insight judgment and impulse control is okay.  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  She like to try a different medication.  Due to interaction with Coumadin we will further defer Prozac dose.  In the past she had tried Abilify, Zoloft, Lexapro and Remeron.  Recommended  to try Effexor 37.5 mg .  Discuss cross titration and provide written instruction about cross titration with Prozac.  She will take Prozac 40 mg every other week for 1 week and then stop.  She will start Effexor 37.5 mg per 1 week and than twice a day.  Discussed medication side effects and recommended to call us back if she has any question or any concern.  Discuss safety plan that anytime having active suicidal thoughts or homicidal thoughts and she need to call 911 or go to the local emergency room.  Encourage to keep appointment with [name] for counseling.  Follow-up in 4 weeks.  She is taking Lamictal , vimpat and Keppra from her neurologist.    Renn Dirocco T., MD 06/25/2015

## 2015-07-02 ENCOUNTER — Other Ambulatory Visit: Payer: Self-pay | Admitting: Nurse Practitioner

## 2015-07-16 ENCOUNTER — Ambulatory Visit (INDEPENDENT_AMBULATORY_CARE_PROVIDER_SITE_OTHER): Payer: Medicare Other | Admitting: Psychology

## 2015-07-16 DIAGNOSIS — F330 Major depressive disorder, recurrent, mild: Secondary | ICD-10-CM

## 2015-07-16 DIAGNOSIS — Z1231 Encounter for screening mammogram for malignant neoplasm of breast: Secondary | ICD-10-CM | POA: Diagnosis not present

## 2015-07-16 DIAGNOSIS — Z86718 Personal history of other venous thrombosis and embolism: Secondary | ICD-10-CM | POA: Diagnosis not present

## 2015-07-16 NOTE — Progress Notes (Signed)
   THERAPIST PROGRESS NOTE  Session Time: 1.40pm-2.20pm  Participation Level: Active  Behavioral Response: Well GroomedAlertaffect WNL  Type of Therapy: Indiviudal Counseling  Treatment Goals addressed: Diagnosis: MDD and goal 1  Interventions: CBT and Supportive  Summary: Cassandra Andrade is a 50 y.o. female who presents with full and bright affect.  Mom reported that Dr. Adele Schilder had changed her medication- no side effects noted- did report a little spunk.  Pt reported that she feels that overall she is doing better since last session.  Pt reported not feeling as down and hopeless and feeling more encouraged.  Pt reported that she has been thinking more positive and that she is using her faith for coping.  Pt reported that she is using her calendars to plan for self.  Pt reported she enjoyed her visit from son's on mother's day.  Pt reported that she still struggles w/ frustrations about not being able to recall and remember things as used to and not walking- but encouraged to keep trying and working towards walking more comfortably w/ prosthesis. .   Suicidal/Homicidal: Nowithout intent/plan  Therapist Response: Assessed pt current functioning per pt and parent report. Processed w/ pt her mood and potential contributing factors w/ coping and patterns of thinking.  Explored w/pt continuing to make attainable goals for self daily.  Encouraged utilizing classes at mental health association.   Plan: Return again in 3 weeks.  MDD   Jan Fireman, [facility] 07/16/2015

## 2015-07-22 ENCOUNTER — Other Ambulatory Visit (HOSPITAL_COMMUNITY): Payer: Self-pay | Admitting: Psychiatry

## 2015-07-23 ENCOUNTER — Ambulatory Visit (INDEPENDENT_AMBULATORY_CARE_PROVIDER_SITE_OTHER): Payer: Medicare Other | Admitting: Psychiatry

## 2015-07-23 ENCOUNTER — Encounter (HOSPITAL_COMMUNITY): Payer: Self-pay | Admitting: Psychiatry

## 2015-07-23 VITALS — BP 110/74 | HR 91 | Ht 67.6 in | Wt 192.2 lb

## 2015-07-23 DIAGNOSIS — F332 Major depressive disorder, recurrent severe without psychotic features: Secondary | ICD-10-CM | POA: Diagnosis not present

## 2015-07-23 DIAGNOSIS — F321 Major depressive disorder, single episode, moderate: Secondary | ICD-10-CM

## 2015-07-23 MED ORDER — VENLAFAXINE HCL ER 75 MG PO CP24: 75.0000 mg | ORAL_CAPSULE | Freq: Every day | ORAL | Status: DC

## 2015-07-23 NOTE — Progress Notes (Signed)
[place] Progress Note  Cassandra Andrade [id number] 50 y.o.  07/23/2015 2:42 PM  Chief Complaint:  I like new medication.  I am feeling less anxious and less depressed.            History of Present Illness: Cassandra Andrade came for her followup appointment.  We started her on Effexor and now she is taking 37.5 mg twice a day.  She is no longer taking Prozac.  She described her mood is much better now.  She has more energy.  She denies any crying spells and she is more hopeful.  She admitted those negative thoughts are gone and she is trying to have more positive approach towards her life.  Her energy level is better.  She denies any irritability, anger, severe mood swing.  She is tolerating Effexor and reported no side effects.  She can use to have seizures for that she take multiple antiseizure medicine.  She is seeing [name]. She lives with the mother who also supervises her medication.  Her appetite is okay.  Her vitals are stable.  Patient denies drinking alcohol or using any illegal substances.  Suicidal Ideation: Yes Plan Formed: No Patient has means to carry out plan: No  Homicidal Ideation: No Plan Formed: No Patient has means to carry out plan: No  Review of Systems  Constitutional: Negative.   HENT: Negative.   Musculoskeletal:       Below-knee amputation and right leg  Skin: Negative.   Neurological: Positive for tingling.    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  She has history of MRSA resulted right knee amputation.  She has hypothyroidism, chronic neuropathy pain , headache  and seizure disorder.  She see Dr. [name] for seizures.  Her primary care is [name] at [facility].    Outpatient Encounter Prescriptions as of 07/23/2015  Medication Sig  . Ascorbic Acid (VITAMIN C) 1000 MG tablet Take 1,000 mg by mouth daily.   . Calcium Carbonate-Vitamin D 600-400 MG-UNIT per tablet Take 1 tablet by mouth daily.   [name] DIAZEPAM RE Place 20 mg rectally. Rectally as needed for seizure lasting longer than 2 minutes  . lacosamide (VIMPAT) 200 MG TABS tablet Take 1 tablet (200 mg total) by mouth 2 (two) times daily.  [name] lamoTRIgine (LAMICTAL) 200 MG tablet Take 200 mg by mouth 2 (two) times daily.  [name] levETIRAcetam (KEPPRA) 1000 MG tablet TAKE 2 TABLETS (2,000 MG TOTAL) BY MOUTH 2 (TWO) TIMES DAILY.  [name] levothyroxine (SYNTHROID, LEVOTHROID) 50 MCG tablet Take 50 mcg by mouth daily before breakfast.  . promethazine (PHENERGAN) 25 MG tablet Take 25 mg by mouth every 6 (six) hours as needed. nausea  . venlafaxine XR (EFFEXOR-XR) 75 MG 24 hr capsule Take 1 capsule (75 mg total) by mouth daily with breakfast.  . warfarin (COUMADIN) 5 MG tablet Take 5 mg by mouth 2 (two) times daily. Presently taking alternating dose 1 & 1/2 tab x 4 days & 1 tab x 3 days  . [DISCONTINUED] venlafaxine XR (EFFEXOR XR) 37.5 MG 24 hr capsule Take 1 capsule (37.5 mg total) by mouth 2 (two) times daily.   No facility-administered encounter medications  on file as of 07/23/2015.   Past Psychiatric History/Hospitalization(s): Patient has at least one psychiatric admission in 2011 due to overdose on her pain medication.  At that time she has a marital conflict and having argument with her husband.  She denies any history of mania psychosis hallucination.  In the past she has given Zoloft and Remeron however she do not remember the details.  She remembered taking Lexapro and Abilify but it was stopped because lack of response.  She took Prozac for a long time until recently it stopped  working. Anxiety: Yes Bipolar Disorder: No Depression: Yes Mania: No Psychosis: No Schizophrenia: No Personality Disorder: No Hospitalization for psychiatric illness: Yes History of Electroconvulsive Shock Therapy: No Prior Suicide Attempts: Yes  Physical Exam: Constitutional:  BP 110/74 mmHg  Pulse 91  Ht 5' 7.6" (1.717 m)  Wt 192 lb 3.2 oz (87.181 kg)  BMI 29.57 kg/m2  General Appearance: alert, oriented, no acute distress and well nourished  Recent Results (from the past 2160 hour(s))  CBC & Diff and Retic     Status: Abnormal   Collection Time: 05/05/15  1:54 PM  Result Value Ref Range   WBC 3.7 (L) 3.9 - 10.3 10e3/uL   NEUT# 1.6 1.5 - 6.5 10e3/uL   HGB 12.5 11.6 - 15.9 g/dL   HCT 38.7 34.8 - 46.6 %   Platelets 210 145 - 400 10e3/uL   MCV 88.8 79.5 - 101.0 fL   MCH 28.7 25.1 - 34.0 pg   MCHC 32.3 31.5 - 36.0 g/dL   RBC 4.36 3.70 - 5.45 10e6/uL   RDW 13.8 11.2 - 14.5 %   lymph# 1.8 0.9 - 3.3 10e3/uL   MONO# 0.3 0.1 - 0.9 10e3/uL   Eosinophils Absolute 0.1 0.0 - 0.5 10e3/uL   Basophils Absolute 0.0 0.0 - 0.1 10e3/uL   NEUT% 42.4 38.4 - 76.8 %   LYMPH% 48.8 14.0 - 49.7 %   MONO% 7.1 0.0 - 14.0 %   EOS% 1.4 0.0 - 7.0 %   BASO% 0.3 0.0 - 2.0 %   nRBC 0 0 - 0 %   Retic % 1.07 0.70 - 2.10 %   Retic Ct Abs 46.65 33.70 - 90.70 10e3/uL   Immature Retic Fract 3.60 1.60 - 10.00 %  Comprehensive metabolic panel     Status: Abnormal   Collection Time: 05/05/15  1:54 PM  Result Value Ref Range   Sodium 139 136 - 145 mEq/L   Potassium 3.5 3.5 - 5.1 mEq/L   Chloride 100 98 - 109 mEq/L   CO2 31 (H) 22 - 29 mEq/L   Glucose 102 70 - 140 mg/dl    Comment: Glucose reference range is for nonfasting patients. Fasting glucose reference range is 70- 100.   BUN 13.1 7.0 - 26.0 mg/dL   Creatinine 1.2 (H) 0.6 - 1.1 mg/dL   Total Bilirubin <0.30 0.20 - 1.20 mg/dL   Alkaline Phosphatase 113 40 - 150 U/L   AST 23 5 - 34 U/L   ALT 27 0 - 55 U/L   Total Protein 7.9 6.4 - 8.3 g/dL    Albumin 4.0 3.5 - 5.0 g/dL   Calcium 10.1 8.4 - 10.4 mg/dL   Anion Gap 9 3 - 11 mEq/L   EGFR 62 (L) >90 ml/min/1.73 m2    Comment: eGFR is calculated using the CKD-EPI Creatinine Equation (2009)    Musculoskeletal: Strength & Muscle Tone: decreased Gait & Station: Patient is wheelchair-bound.  She has the  right below-knee amputation. Patient leans: See above  Mental Status Examination:  patient is casually dressed and groomed.  She maintained fair eye contact.  She described herGood and her affect is improved from the past. Her speech is very slow, decreased volume but coherent.  She uses wheelchair because of leg amputation.  She denies any auditory or visual hallucination.  She denies any active or passive suicidal thoughts or homicidal thought.  Her attention consultation is fair.  There were no delusions, paranoia or any obsessive thoughts.  Her attention and concentration is fair.  She's alert and oriented 3.  Her psychomotor activity is normal.  Her fund of knowledge is adequate.  Her insight judgment and impulse control is okay.  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  I will continue Effexor XR 75 mg daily .  Patient does not have any side effects.  She is no longer taking Prozac.  Encouraged to see [name] for counseling.  Discussed medication side effects and recommended to call us back if she has any question or any concern.  Discuss safety plan that anytime having active suicidal thoughts or homicidal thoughts and she need to call 911 or go to the local emergency room.  Follow-up in 12 weeks.  She is taking Lamictal , vimpat and Keppra from her  neurologist.    ARFEEN,SYED T., MD 07/23/2015

## 2015-07-30 ENCOUNTER — Ambulatory Visit (INDEPENDENT_AMBULATORY_CARE_PROVIDER_SITE_OTHER): Payer: Medicare Other | Admitting: Nurse Practitioner

## 2015-07-30 ENCOUNTER — Encounter: Payer: Self-pay | Admitting: Nurse Practitioner

## 2015-07-30 DIAGNOSIS — G8929 Other chronic pain: Secondary | ICD-10-CM

## 2015-07-30 DIAGNOSIS — G40909 Epilepsy, unspecified, not intractable, without status epilepticus: Secondary | ICD-10-CM

## 2015-07-30 DIAGNOSIS — Z89511 Acquired absence of right leg below knee: Secondary | ICD-10-CM

## 2015-07-30 DIAGNOSIS — M549 Dorsalgia, unspecified: Secondary | ICD-10-CM

## 2015-07-30 DIAGNOSIS — R569 Unspecified convulsions: Secondary | ICD-10-CM

## 2015-07-30 NOTE — Progress Notes (Signed)
I have read the note, and I agree with the clinical assessment and plan.  Cu[place]ut[place] Attar KEITH   

## 2015-07-30 NOTE — Patient Instructions (Signed)
Continue Keppra thousand milligrams 2 tabs twice daily  Continue Lamictal 200mg  twice daily  Continued Vimpat 200 twice daily  Seizure meds have been prescribed by Dr. Ginny Forth. Follow-up in 6 months next with Dr. [name]

## 2015-07-30 NOTE — Progress Notes (Signed)
[facility]  PATIENT: Cassandra Andrade DOB: [birth year]   REASON FOR VISIT: follow up for seizure disorder, gait abnormality has VNS HISTORY FROM:patient and mother    HISTORY OF PRESENT ILLNESS:Cassandra Andrade, 50 year old female returns for followup with her mother. She was last seen in this office 01/29/15. She has a history of intractable seizures. The patient has a history of a cerebral aneurysm and a right brain stroke. The patient has been on Lamictal and Vimpat, and Keppra was added. The patient has had video EEG monitoring that has confirmed the presence of subclinical seizures as well as clinical seizures without EEG correlates. It is felt that these events are stereotypical, and likely represent focal seizures without EEG correlates. The patient has been placed on Keppra, currently on 2000 mg twice daily. Her seizure frequency is now weekly or a little less. The patient may have episodes of agitation, and slurred speech. The patient is eating well. The patient is ambulatory has a  BKA, on the right. The patient returns to this office for an evaluation. Recent labs reviewed, Dr. Raj Janus at [facility] placed VNS on 12/31/13 at [facility] and patient is seen every 3 months for adjustments. The most recent note from Dr. Marti Sleigh 3/2017continued her current seizure meds without change. She is on Lamictal 200 mg twice daily.  Continue Keppra thousand milligrams 2 capsules twice daily and continue Vimpat 200 twice daily. The mother and patient claim her mood is better since placement of VNS as well.  She returns for reevaluation   REVIEW OF SYSTEMS: Full 14 system review of systems performed and notable only for those listed, all others are neg:  Constitutional: neg  Cardiovascular: neg Ear/Nose/Throat: neg  Skin: neg Eyes: neg Respiratory: neg Gastroitestinal: neg  Hematology/Lymphatic: neg  Endocrine: neg Musculoskeletal:neg Allergy/Immunology: neg Neurological:  neg Psychiatric: neg Sleep : neg   ALLERGIES: Allergies  Allergen Reactions  . Vicodin [Hydrocodone-Acetaminophen] Nausea And Vomiting  . Morphine Rash  . Morphine And Related Rash    Allergic reaction only in iv form    HOME MEDICATIONS: Outpatient Prescriptions Prior to Visit  Medication Sig Dispense Refill  . Ascorbic Acid (VITAMIN C) 1000 MG tablet Take 1,000 mg by mouth daily.     . Calcium Carbonate-Vitamin D 600-400 MG-UNIT per tablet Take 1 tablet by mouth daily.     [name] DIAZEPAM RE Place 20 mg rectally. Rectally as needed for seizure lasting longer than 2 minutes    . lacosamide (VIMPAT) 200 MG TABS tablet Take 1 tablet (200 mg total) by mouth 2 (two) times daily. 180 tablet 1  . lamoTRIgine (LAMICTAL) 200 MG tablet Take 200 mg by mouth 2 (two) times daily.  5  . levETIRAcetam (KEPPRA) 1000 MG tablet TAKE 2 TABLETS (2,000 MG TOTAL) BY MOUTH 2 (TWO) TIMES DAILY. 120 tablet 1  . levothyroxine (SYNTHROID, LEVOTHROID) 50 MCG tablet Take 50 mcg by mouth daily before breakfast.    . promethazine (PHENERGAN) 25 MG tablet Take 25 mg by mouth every 6 (six) hours as needed. nausea    . venlafaxine XR (EFFEXOR-XR) 75 MG 24 hr capsule Take 1 capsule (75 mg total) by mouth daily with breakfast. 30 capsule 2  . warfarin (COUMADIN) 5 MG tablet Take 5 mg by mouth 2 (two) times daily. Presently taking alternating dose 1 & 1/2 tab x 4 days & 1 tab x 3 days     No facility-administered medications prior to visit.    PAST MEDICAL HISTORY: Past Medical History  Diagnosis Date  . Anxiety   . Depression   . Seizures ([place])   . CVA (cerebrovascular accident due to intracerebral hemorrhage) ([place]) 2004  . Arthritis   . MRSA (methicillin resistant Staphylococcus aureus)   . S/P BKA (below knee amputation) unilateral (HCC)     Right   . Thyroid disease   . 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 DVT (deep venous thrombosis) (HCC)      Left leg    PAST SURGICAL HISTORY: Past Surgical History  Procedure Laterality Date  . Fracture surgery      Rt. femur, folllowed by MRSA  . Brain surgery  2004    cerebral aneurysm  . Total knee arthroplasty Right   . Above knee leg amputation Right     Osteosarcoma    FAMILY HISTORY: Family History  Problem Relation Age of Onset  . Anxiety disorder Mother   . Diabetes Father     SOCIAL HISTORY: Social History   Social History  . Marital Status: Legally Separated    Spouse Name: N/A  . Number of Children: 3  . Years of Education: GED   Occupational History  . Not on file.   Social History Main Topics  . Smoking status: Never Smoker   . Smokeless tobacco: Never Used     Comment: never used tobacco  . Alcohol Use: No  . Drug Use: No  . Sexual Activity: Not Currently   Other Topics Concern  . Not on file   Social History Narrative   Patient lives at home with mom.    Patient has GED.    Patient has 3 children.    Patient is married but in the process of divorced.            PHYSICAL EXAM  Filed Vitals:   07/30/15 1554  Height: 5' 7.5" (1.715 m)  Weight: 193 lb 6.4 oz (87.726 kg)   Body mass index is 29.83 kg/(m^2). General: The patient is alert and cooperative at the time of the examination.  Skin: No significant peripheral edema is noted on the left.  Neurologic Exam  Mental status: The patient is oriented x 3.  Cranial nerves: Facial symmetry is present. Speech is normal, no aphasia or dysarthria is noted. Extraocular movements are notable for restriction of superior gaze. On primary gaze, there is exotropia of the left eye. Visual fields are full.  Motor: The patient has good strength in all 4 extremities. (R above knee amp) Sensory examination: Soft touch sensation on the legs, arms, and face is symmetric  Coordination: The patient has good finger-nose-finger and heel-to-shin on the left.  Gait and station: The patient has a Right  below-knee amputation, with prosthesis. Ambulated with a rolling walker. Reflexes: Deep tendon reflexes are symmetric in the arms, cannot get reflexes on the right leg secondary to amputation.  DIAGNOSTIC DATA (LABS, IMAGING, TESTING) - I reviewed patient records, labs, notes, testing and imaging myself where available.  Lab Results  Component Value Date   WBC 3.7* 05/05/2015   HGB 12.5 05/05/2015   HCT 38.7 05/05/2015   MCV 88.8 05/05/2015   PLT 210 05/05/2015      Component Value Date/Time   NA 139 05/05/2015 1354   NA 138 07/11/2013 1346   NA 139 05/05/2013 1217   NA 139 10/24/2012 1031   K 3.5 05/05/2015 1354   K 3.6 07/11/2013 1346   K  3.9 05/05/2013 1217   CL 97* 07/11/2013 1346   CL 99 05/05/2013 1217   CO2 31* 05/05/2015 1354   CO2 31 07/11/2013 1346   CO2 23 02/22/2013 0455   GLUCOSE 102 05/05/2015 1354   GLUCOSE 126* 07/11/2013 1346   GLUCOSE 75 05/05/2013 1217   GLUCOSE 75 10/24/2012 1031   BUN 13.1 05/05/2015 1354   BUN 16 07/11/2013 1346   BUN 11 05/05/2013 1217   BUN 11 10/24/2012 1031   CREATININE 1.2* 05/05/2015 1354   CREATININE 1.0 07/11/2013 1346   CREATININE 0.90 05/05/2013 1217   CALCIUM 10.1 05/05/2015 1354   CALCIUM 10.0 07/11/2013 1346   CALCIUM 8.8 02/22/2013 0455   CALCIUM 8.4 12/26/2009 1025   PROT 7.9 05/05/2015 1354   PROT 7.6 07/11/2013 1346   PROT 7.7 02/17/2013 0313   PROT 6.9 10/24/2012 1031   ALBUMIN 4.0 05/05/2015 1354   ALBUMIN 3.4 07/11/2013 1346   ALBUMIN 3.8 02/17/2013 0313   AST 23 05/05/2015 1354   AST 26 07/11/2013 1346   AST 26 02/17/2013 0313   ALT 27 05/05/2015 1354   ALT 27 07/11/2013 1346   ALT 33 02/17/2013 0313   ALKPHOS 113 05/05/2015 1354   ALKPHOS 102* 07/11/2013 1346   ALKPHOS 105 02/17/2013 0313   BILITOT <0.30 05/05/2015 1354   BILITOT 0.40 07/11/2013 1346   BILITOT 0.2* 02/17/2013 0313   GFRNONAA 75* 02/22/2013 0455   GFRAA 29* 02/22/2013 0455    ASSESSMENT AND PLAN 50 y.o. year old female has  a past medical history of Anxiety; Depression; Seizures; CVA (cerebrovascular accident due to intracerebral hemorrhage) (2004); MRSA (methicillin resistant Staphylococcus aureus); S/P BKA (below knee amputation) unilateral; Unspecified epilepsy with intractable epilepsy (02/28/2013); and DVT (deep venous thrombosis). here to follow-up. Placement of vagal nerve stimulator 12/17/2013.   Continue Keppra thousand milligrams 2 tabs twice daily  Continue Lamictal 200mg  twice daily  Continued Vimpat 200 twice daily  Seizure meds have been prescribed by Dr. Ginny Forth. Follow-up in 6 months next with Dr. [name] Mom will decide if she wants Dr. [name] to program VNS.  Dennie Bible, [facility], [facility], APRN  [facility] [address] [place], [place] [address] [phone]

## 2015-08-01 ENCOUNTER — Telehealth: Payer: Self-pay | Admitting: Neurology

## 2015-08-01 NOTE — Telephone Encounter (Signed)
Pt's mother called said she spoke to [place] at [facility] 07/30/15 reg pt's Cybronics. She said Hoyle Sauer went out of the room and when she returned she said Dr [name] knew how to check that and he said he would be willing to check it. S he is in the process of requesting records from [facility]

## 2015-08-06 ENCOUNTER — Ambulatory Visit (HOSPITAL_COMMUNITY): Payer: Self-pay | Admitting: Psychiatry

## 2015-08-13 DIAGNOSIS — Z86718 Personal history of other venous thrombosis and embolism: Secondary | ICD-10-CM | POA: Diagnosis not present

## 2015-08-19 ENCOUNTER — Telehealth: Payer: Self-pay | Admitting: *Deleted

## 2015-08-19 ENCOUNTER — Telehealth: Payer: Self-pay | Admitting: Neurology

## 2015-08-19 NOTE — Telephone Encounter (Signed)
I proceeded the medical reports from [facility], the patient has a history of a cerebral aneurysm, status post craniotomy, stroke-CT scan shows extensive encephalomalacia in the right temporal lobe and in both the anterior and posterior aspects. There is extensive hypometabolism throughout the right hemisphere. Seizure spells are characterized by left facial twitching associated with head deviation and arm twitching. These episodes are preceded by the patient hearing a noise and having a strange feeling in the head. Patient has episodes lasting several minutes, may occur 2-4 times a day. The patient has been through the epilepsy monitoring unit. Enhance psychiatric and counseling services were recommended. EEG study shows an unremarkable awake and drowsy prolonged EEG without definite epileptiform discharges. The patient had several typical episodes during the admission, EEG did not show underlying electrographic seizures. The patient did have a subclinical seizure while in the epilepsy monitoring unit, however. The patient remains on Vimpat, Lamictal, and Keppra. During the admission, the patient had 22 typical seizure events. The patient had one subclinical seizure originating from the right anterior temporal region and one subclinical seizure from the right posterior temporal region. EEG did show some right frontal sharp waves and right frontal delta slowing. Question of racing a vagal nerve stimulator was entertained. The patient indicated that she wished to pursue the vagal nerve stimulator placement. This was done in September 2015. Epilepsy monitoring unit hospitalization was in April 2015.

## 2015-08-19 NOTE — Telephone Encounter (Signed)
Pt notes from [facility] on Dr. Ollen Barges desk.

## 2015-08-20 ENCOUNTER — Ambulatory Visit (HOSPITAL_COMMUNITY): Payer: Self-pay | Admitting: Psychology

## 2015-08-30 ENCOUNTER — Other Ambulatory Visit: Payer: Self-pay | Admitting: Neurology

## 2015-09-10 DIAGNOSIS — Z86718 Personal history of other venous thrombosis and embolism: Secondary | ICD-10-CM | POA: Diagnosis not present

## 2015-09-17 ENCOUNTER — Ambulatory Visit (INDEPENDENT_AMBULATORY_CARE_PROVIDER_SITE_OTHER): Payer: Medicare Other | Admitting: Psychology

## 2015-09-17 DIAGNOSIS — F330 Major depressive disorder, recurrent, mild: Secondary | ICD-10-CM | POA: Diagnosis not present

## 2015-09-17 DIAGNOSIS — I749 Embolism and thrombosis of unspecified artery: Secondary | ICD-10-CM | POA: Diagnosis not present

## 2015-09-17 DIAGNOSIS — Z7901 Long term (current) use of anticoagulants: Secondary | ICD-10-CM | POA: Diagnosis not present

## 2015-09-17 NOTE — Progress Notes (Signed)
   THERAPIST PROGRESS NOTE  Session Time: 2.35pm-3.15pm  Participation Level: Active  Behavioral Response: Well GroomedAlertaffect WNL  Type of Therapy: Individual Therapy  Treatment Goals addressed: Diagnosis: MDD and goal 1.  Interventions: CBT and Supportive  Summary: Jimesha Schut is a 50 y.o. female who presents with full and bright affect.  Pt reported that she isn'f feeling depressed mood and feels "normal" with her mood.  Pt does report that she still struggles w/ feeling self conscious and with feeling that she should be further along w/ her walking.  Pt reported that she isn't giving up and is focused on continuing to move forward w/ her goals even if that is doing what she can each day.  Pt also gets concerned that she is a burden on her mother and wants her to know that she appreciates her.  Pt reports she will tell her and mother states not necessary.  Pt is able to challenge and reframe distortion that she is a burden.  Pt discussed recent interaction w/ her sons and discussed joy that they give her.    Suicidal/Homicidal: Nowithout intent/plan  Therapist Response: Assessed pt current functioning per pt report. Processed w/ pt her concerns- self worth, feeling burden and assisted pt in challenging and reframing.  Discussed pt acceptance of process towards her desired goals.   Plan: Return again in 4 weeks.  Diagnosis: MDD, mild   YATES,LEANNE, LPC 09/17/2015

## 2015-10-08 DIAGNOSIS — Z86718 Personal history of other venous thrombosis and embolism: Secondary | ICD-10-CM | POA: Diagnosis not present

## 2015-10-12 ENCOUNTER — Other Ambulatory Visit (HOSPITAL_COMMUNITY): Payer: Self-pay | Admitting: Psychiatry

## 2015-10-12 DIAGNOSIS — F321 Major depressive disorder, single episode, moderate: Secondary | ICD-10-CM

## 2015-10-14 ENCOUNTER — Other Ambulatory Visit (HOSPITAL_COMMUNITY): Payer: Self-pay | Admitting: Psychiatry

## 2015-10-14 DIAGNOSIS — F321 Major depressive disorder, single episode, moderate: Secondary | ICD-10-CM

## 2015-10-17 ENCOUNTER — Other Ambulatory Visit (HOSPITAL_COMMUNITY): Payer: Self-pay

## 2015-10-17 DIAGNOSIS — F321 Major depressive disorder, single episode, moderate: Secondary | ICD-10-CM

## 2015-10-17 MED ORDER — VENLAFAXINE HCL ER 75 MG PO CP24: 75.0000 mg | ORAL_CAPSULE | Freq: Every day | ORAL | 2 refills | Status: DC

## 2015-10-21 ENCOUNTER — Ambulatory Visit (HOSPITAL_COMMUNITY): Payer: Self-pay | Admitting: Psychology

## 2015-10-22 ENCOUNTER — Ambulatory Visit (HOSPITAL_COMMUNITY): Payer: Self-pay | Admitting: Psychiatry

## 2015-11-04 ENCOUNTER — Ambulatory Visit (INDEPENDENT_AMBULATORY_CARE_PROVIDER_SITE_OTHER): Payer: Medicare Other | Admitting: Psychology

## 2015-11-04 DIAGNOSIS — F331 Major depressive disorder, recurrent, moderate: Secondary | ICD-10-CM | POA: Diagnosis not present

## 2015-11-04 NOTE — Progress Notes (Signed)
   THERAPIST PROGRESS NOTE  Session Time: 2.35pm-3.18pm  Participation Level: Active  Behavioral Response: Well GroomedAlertDepressed  Type of Therapy: Individual Therapy  Treatment Goals addressed: Diagnosis: MDD and goal 1  Interventions: CBT and Supportive  Summary: Estera Blomgren is a 50 y.o. female who presents with report of increased depressed mood.  Pt reported that not sure if meds need to be tweaked as struggling more w/ depression and not feeling that normal coping skills are helping.  Pt states that she knows she is a burden on her mom to help take care of her.  Pt is feeling discouraged that she is not able to do more for self and wants for change.  Pt did talk about how she was able to walk cane assisted recent and this was a big step for her.  Pt was able to reframe negative self talk in session and acknowledge that she is making progress and needs the resources to assist her further.  Mom agreed to f/u w/ this at her next appointment w/physician.     Suicidal/Homicidal: Nowithout intent/plan  Therapist Response: Assessed pt current functioning per pt report.  Explored w/pt her increased depressed mood and thoughts related.  Assisted pt in challenge distortions and reframing.  Discussed w/ mom f/u w/ PCP re: referrals for physical therapy programs for next steps for pt.    Plan: Return again in 2 weeks.  Diagnosis: MDD, recurrent, moderate    YATES,LEANNE, LPC 11/04/2015

## 2015-11-05 DIAGNOSIS — Z86718 Personal history of other venous thrombosis and embolism: Secondary | ICD-10-CM | POA: Diagnosis not present

## 2015-11-18 ENCOUNTER — Ambulatory Visit (INDEPENDENT_AMBULATORY_CARE_PROVIDER_SITE_OTHER): Payer: Medicare Other | Admitting: Psychology

## 2015-11-18 DIAGNOSIS — F330 Major depressive disorder, recurrent, mild: Secondary | ICD-10-CM

## 2015-11-18 NOTE — Progress Notes (Signed)
   THERAPIST PROGRESS NOTE  Session Time: 11.12am-11.46am  Participation Level: Active  Behavioral Response: Well GroomedAlertaffect bright  Type of Therapy: Individual Therapy  Treatment Goals addressed: Diagnosis: MDd and goal 1.  Interventions: CBT and Supportive  Summary: Cassandra Andrade is a 50 y.o. female who presents with full and bright affect.  Pt reports she has good news- that she was able to get dental insurance and will have a visit next week. Pt felt good about this and that she was the one to initiate and research things.  Pt reported that she has been able to have improved mood- focus on becoming aware of negative thought patterns and reframe things. Pt reports she is continuing to work towards her goals of walking and walks daily w/ walker or cane.     Suicidal/Homicidal: Nowithout intent/plan  Therapist Response: Assessed pt current functioning per pt report.  Processed w/ pt her improved mood and reflected change of thinking and positive reframes.  Disucssed pt meting goal and not giving up on self.  Explored w/pt setting small daily goals to keep self moving forward.   Plan: Return again in 4 weeks.  Diagnosis: MDD    Jan Fireman, [facility] 11/18/2015

## 2015-12-03 ENCOUNTER — Telehealth: Payer: Self-pay | Admitting: Neurology

## 2015-12-03 DIAGNOSIS — Z86718 Personal history of other venous thrombosis and embolism: Secondary | ICD-10-CM | POA: Diagnosis not present

## 2015-12-03 NOTE — Telephone Encounter (Signed)
Work-in appt scheduled for tomorrow.

## 2015-12-03 NOTE — Telephone Encounter (Signed)
Pt's mother called said the pt had a seizure about 9:30 this morning.  She is wanting to make an appt with Dr [name] for him to adjust the Cybronics (see office note from 08/01/15). Please call to discuss with her.

## 2015-12-04 ENCOUNTER — Ambulatory Visit (INDEPENDENT_AMBULATORY_CARE_PROVIDER_SITE_OTHER): Payer: Medicare Other | Admitting: Neurology

## 2015-12-04 ENCOUNTER — Encounter: Payer: Self-pay | Admitting: Neurology

## 2015-12-04 VITALS — BP 106/73 | HR 90 | Ht 67.0 in | Wt 190.0 lb

## 2015-12-04 DIAGNOSIS — G40909 Epilepsy, unspecified, not intractable, without status epilepticus: Secondary | ICD-10-CM | POA: Diagnosis not present

## 2015-12-04 DIAGNOSIS — Z5181 Encounter for therapeutic drug level monitoring: Secondary | ICD-10-CM | POA: Diagnosis not present

## 2015-12-04 DIAGNOSIS — G40119 Localization-related (focal) (partial) symptomatic epilepsy and epileptic syndromes with simple partial seizures, intractable, without status epilepticus: Secondary | ICD-10-CM

## 2015-12-04 DIAGNOSIS — G40209 Localization-related (focal) (partial) symptomatic epilepsy and epileptic syndromes with complex partial seizures, not intractable, without status epilepticus: Secondary | ICD-10-CM

## 2015-12-04 NOTE — Procedures (Signed)
    History: Cassandra Andrade is a 50 year old patient with a history of intractable epilepsy. The patient has had a vagal nerve stimulator placed, she had a seizure yesterday associated with altered consciousness, no generalized jerking was noted. She comes in today for a readjustment of her vagal nerve stimulator. She has gone from having almost daily seizures to having 1 or 2 seizures a week following the placement of the vagal nerve stimulator.   VAGAL NERVE STIMULATOR SETTINGS:  Output current: 1.5 milliamps  Signal frequency: 30 Hz  Pulse width: 250 microseconds  On time: 30 seconds  Off time: 5.0 minutes changed to 3.0 minutes  Magnet current: 1.75 milliamps  Magnet on time: 60 seconds  Pulse width: 250 microseconds  Lead impedance: 2831 Ohms  IFI: No  The VNS unit was interrogated, and the settings were changed as above. The patient appeared to tolerate the changes well.  Jill Alexanders MD 12/04/2015 4:18 PM  [facility] [address] [place], [place] [address]  Phone [phone] Fax [phone]

## 2015-12-04 NOTE — Progress Notes (Signed)
Reason for visit: Seizures  Cassandra Andrade is an 50 y.o. female  History of present illness:  Cassandra Andrade is a 50 year old right-handed black female with a history of intractable epilepsy. The patient has a history of cerebral aneurysm with a right brain stroke. She has been treated with Lamictal, Vimpat, and Keppra, the mother thinks that she has been taken off of one of these medications at [facility], but she is not sure which medication she has stopped. The patient had a seizure yesterday, the vagal nerve stimulator has significantly reduced the frequency of the seizures from almost daily to one or 2 times a week. The patient overall is doing better following the vagal nerve stimulator placement. The patient comes in for an evaluation today.  Past Medical History:  Diagnosis Date  . Anxiety   . Arthritis   . Cancer (HCC)    Osteosarcoma, right leg  . CVA (cerebrovascular accident due to intracerebral hemorrhage) ([place]) 2004  . Depression   . DVT (deep venous thrombosis) (HCC)    Left leg  . MRSA (methicillin resistant Staphylococcus aureus)   . 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 Seizures ([place])   . Thyroid disease   . 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 ABOVE KNEE LEG AMPUTATION Right    Osteosarcoma  . BRAIN SURGERY  2004   cerebral aneurysm  . FRACTURE SURGERY     Rt. femur, folllowed by MRSA  . TOTAL KNEE ARTHROPLASTY Right     Family History  Problem Relation Age of Onset  . Anxiety disorder Mother   . Diabetes Father     Social history:  reports that she has never smoked. She has never used smokeless tobacco. She reports that she does not drink alcohol or use drugs.    Allergies  Allergen Reactions  . Vicodin [Hydrocodone-Acetaminophen] Nausea And Vomiting  . Morphine Rash  . Morphine And Related Rash    Allergic reaction only in iv form    Medications:    Prior to Admission medications   Medication Sig Start Date End Date Taking? Authorizing Provider  Ascorbic Acid (VITAMIN C) 1000 MG tablet Take 1,000 mg by mouth daily.  08/17/11  Yes Historical Provider, MD  Calcium Carbonate-Vitamin D 600-400 MG-UNIT per tablet Take 1 tablet by mouth daily.    Yes Historical Provider, MD  DIAZEPAM RE Place 20 mg rectally. Rectally as needed for seizure lasting longer than 2 minutes   Yes Historical Provider, MD  lacosamide (VIMPAT) 200 MG TABS tablet Take 1 tablet (200 mg total) by mouth 2 (two) times daily. 07/15/14  Yes Dennie Bible, NP  levETIRAcetam (KEPPRA) 1000 MG tablet TAKE 2 TABLETS (2,000 MG TOTAL) BY MOUTH 2 (TWO) TIMES DAILY. 09/01/15  Yes Kathrynn Ducking, MD  levothyroxine (SYNTHROID, LEVOTHROID) 50 MCG tablet Take 50 mcg by mouth daily before breakfast.   Yes Historical Provider, MD  promethazine (PHENERGAN) 25 MG tablet Take 25 mg by mouth every 6 (six) hours as needed. nausea   Yes Historical Provider, MD  venlafaxine XR (EFFEXOR-XR) 75 MG 24 hr capsule Take 1 capsule (75 mg total) by mouth daily with breakfast. 10/17/15  Yes Kathlee Nations, MD  warfarin (COUMADIN) 5 MG tablet Take 5 mg by mouth 2 (two) times daily. Presently taking alternating dose 1 & 1/2 tab x 4 days & 1 tab x 3 days  Yes Historical Provider, MD  lamoTRIgine (LAMICTAL) 200 MG tablet Take 200 mg by mouth 2 (two) times daily. 05/08/15   Historical Provider, MD    ROS:  Out of a complete 14 system review of symptoms, the patient complains only of the following symptoms, and all other reviewed systems are negative.  Bruising easily Memory loss, seizures Agitation, depression, anxiety  Blood pressure 106/73, pulse 90, height 5\' 7"  (1.702 m), weight 190 lb (86.2 kg).  Physical Exam  General: The patient is alert and cooperative at the time of the examination.  Skin: No significant peripheral edema is noted. The patient has an above-knee amputation on the  right.   Neurologic Exam  Mental status: The patient is alert and oriented x 3 at the time of the examination. The patient has apparent normal recent and remote memory, with an apparently normal attention span and concentration ability.   Cranial nerves: Facial symmetry is present. Speech is normal, no aphasia or dysarthria is noted. Extraocular movements are full. Visual fields are notable for a dense left homonymous visual field deficit.  Motor: The patient has good strength in all 4 extremities, with exception of some mild weakness of the left upper extremity.  Sensory examination: Soft touch sensation is symmetric on the face, arms, and legs.  Coordination: The patient has good finger-nose-finger bilaterally.  Gait and station: The patient has a right above-knee amputation, she was not ambulated. She is in a wheelchair.  Reflexes: Deep tendon reflexes are symmetric.   Assessment/Plan:  1. Intractable epilepsy  2. History of cerebral aneurysm, right brain stroke  The patient has had an adjustment of her vagal nerve stimulator today. She will have blood work done today. She will follow-up in December for her next scheduled appointment. The mother will try to get back to [place] with the actual dosing of her current medications.  Jill Alexanders MD 12/04/2015 3:48 PM  [facility] [address] [place], [place] [address]  Phone [phone] Fax [phone]

## 2015-12-06 LAB — COMPREHENSIVE METABOLIC PANEL
A/G RATIO: 1.5 (ref 1.2–2.2)
ALBUMIN: 4 g/dL (ref 3.5–5.5)
ALK PHOS: 119 IU/L — AB (ref 39–117)
ALT: 27 IU/L (ref 0–32)
AST: 27 IU/L (ref 0–40)
BUN / CREAT RATIO: 14 (ref 9–23)
BUN: 15 mg/dL (ref 6–24)
CHLORIDE: 102 mmol/L (ref 96–106)
CO2: 26 mmol/L (ref 18–29)
Calcium: 8.9 mg/dL (ref 8.7–10.2)
Creatinine, Ser: 1.06 mg/dL — ABNORMAL HIGH (ref 0.57–1.00)
GFR calc Af Amer: 71 mL/min/{1.73_m2} (ref >59)
GFR calc non Af Amer: 62 mL/min/{1.73_m2} (ref >59)
GLOBULIN, TOTAL: 2.7 g/dL (ref 1.5–4.5)
GLUCOSE: 151 mg/dL — AB (ref 65–99)
Potassium: 4.1 mmol/L (ref 3.5–5.2)
SODIUM: 143 mmol/L (ref 134–144)
Total Protein: 6.7 g/dL (ref 6.0–8.5)

## 2015-12-06 LAB — CBC WITH DIFFERENTIAL/PLATELET
BASOS ABS: 0 10*3/uL (ref 0.0–0.2)
Basos: 1 %
EOS (ABSOLUTE): 0.1 10*3/uL (ref 0.0–0.4)
Eos: 2 %
HEMATOCRIT: 35.8 % (ref 34.0–46.6)
Hemoglobin: 11.4 g/dL (ref 11.1–15.9)
Immature Grans (Abs): 0 10*3/uL (ref 0.0–0.1)
Immature Granulocytes: 0 %
LYMPHS ABS: 2.2 10*3/uL (ref 0.7–3.1)
Lymphs: 52 %
MCH: 28 pg (ref 26.6–33.0)
MCHC: 31.8 g/dL (ref 31.5–35.7)
MCV: 88 fL (ref 79–97)
MONOS ABS: 0.4 10*3/uL (ref 0.1–0.9)
Monocytes: 9 %
Neutrophils Absolute: 1.6 10*3/uL (ref 1.4–7.0)
Neutrophils: 36 %
Platelets: 201 10*3/uL (ref 150–379)
RBC: 4.07 x10E6/uL (ref 3.77–5.28)
RDW: 13.5 % (ref 12.3–15.4)
WBC: 4.3 10*3/uL (ref 3.4–10.8)

## 2015-12-06 LAB — LAMOTRIGINE LEVEL: LAMOTRIGINE LVL: 5.9 ug/mL (ref 2.0–20.0)

## 2015-12-10 DIAGNOSIS — Z23 Encounter for immunization: Secondary | ICD-10-CM | POA: Diagnosis not present

## 2015-12-10 DIAGNOSIS — Z0000 Encounter for general adult medical examination without abnormal findings: Secondary | ICD-10-CM | POA: Diagnosis not present

## 2015-12-10 DIAGNOSIS — G40909 Epilepsy, unspecified, not intractable, without status epilepticus: Secondary | ICD-10-CM | POA: Diagnosis not present

## 2015-12-10 DIAGNOSIS — Z01419 Encounter for gynecological examination (general) (routine) without abnormal findings: Secondary | ICD-10-CM | POA: Diagnosis not present

## 2015-12-10 DIAGNOSIS — Z124 Encounter for screening for malignant neoplasm of cervix: Secondary | ICD-10-CM | POA: Diagnosis not present

## 2015-12-10 DIAGNOSIS — F339 Major depressive disorder, recurrent, unspecified: Secondary | ICD-10-CM | POA: Diagnosis not present

## 2015-12-10 DIAGNOSIS — Z89611 Acquired absence of right leg above knee: Secondary | ICD-10-CM | POA: Diagnosis not present

## 2015-12-15 ENCOUNTER — Ambulatory Visit (HOSPITAL_COMMUNITY): Payer: Self-pay | Admitting: Psychology

## 2015-12-17 ENCOUNTER — Ambulatory Visit (HOSPITAL_COMMUNITY): Payer: Self-pay | Admitting: Psychiatry

## 2015-12-31 DIAGNOSIS — Z86718 Personal history of other venous thrombosis and embolism: Secondary | ICD-10-CM | POA: Diagnosis not present

## 2016-01-05 ENCOUNTER — Other Ambulatory Visit (HOSPITAL_COMMUNITY): Payer: Self-pay | Admitting: Psychiatry

## 2016-01-05 DIAGNOSIS — F321 Major depressive disorder, single episode, moderate: Secondary | ICD-10-CM

## 2016-01-12 ENCOUNTER — Ambulatory Visit (INDEPENDENT_AMBULATORY_CARE_PROVIDER_SITE_OTHER): Payer: Medicare Other | Admitting: Psychology

## 2016-01-12 DIAGNOSIS — F331 Major depressive disorder, recurrent, moderate: Secondary | ICD-10-CM | POA: Diagnosis not present

## 2016-01-12 NOTE — Progress Notes (Signed)
   THERAPIST PROGRESS NOTE  Session Time: 2.50pm-3.31pm  Participation Level: Active  Behavioral Response: Well GroomedAlertDepressed  Type of Therapy: Individual Therapy  Treatment Goals addressed: Diagnosis: MDD and goal 1.  Interventions: CBT  Summary: Cassandra Andrade is a 50 y.o. female who presents with affect WNL.  Pt reports that over the past couple of weeks she has had periods of depressed mood that lasted a couple of days.  Pt denies any crying spells, no SI.  Pt reported that her nurse aid has been difficult to get along w/ as acts like task master and not encouraging. Pt discussed how she is motivated to prove her wrong on things. Pt reported that she is remaining to reframe on positives and is enjoying contact w/her son's.  Pt reports that she continues to practice her walking 2x a day and feels making some progress- but still gets frustrated w/ slowness of progress.  Suicidal/Homicidal: Nowithout intent/plan  Therapist Response: Assessed pt current functioning per pt report.  Processed w/pt stressors and discussed pt mood and reframes for positive self talk and self compassion- despite negative messages received.  Explored w/pt progress made and discussed pt continued steps she makes each day.   Plan: Return again in 3-4 weeks.  Diagnosis: MDD   Jan Fireman, [facility] 01/12/2016

## 2016-01-14 ENCOUNTER — Telehealth: Payer: Self-pay | Admitting: Hematology

## 2016-01-14 ENCOUNTER — Ambulatory Visit (HOSPITAL_BASED_OUTPATIENT_CLINIC_OR_DEPARTMENT_OTHER): Payer: Medicare Other | Admitting: Hematology

## 2016-01-14 ENCOUNTER — Other Ambulatory Visit (HOSPITAL_BASED_OUTPATIENT_CLINIC_OR_DEPARTMENT_OTHER): Payer: Medicare Other

## 2016-01-14 ENCOUNTER — Encounter: Payer: Self-pay | Admitting: Hematology

## 2016-01-14 VITALS — BP 107/66 | HR 89 | Temp 98.7°F | Resp 17 | Ht 67.0 in | Wt 171.2 lb

## 2016-01-14 DIAGNOSIS — D649 Anemia, unspecified: Secondary | ICD-10-CM

## 2016-01-14 DIAGNOSIS — Z89611 Acquired absence of right leg above knee: Secondary | ICD-10-CM

## 2016-01-14 DIAGNOSIS — Z7901 Long term (current) use of anticoagulants: Secondary | ICD-10-CM

## 2016-01-14 DIAGNOSIS — I82513 Chronic embolism and thrombosis of femoral vein, bilateral: Secondary | ICD-10-CM

## 2016-01-14 DIAGNOSIS — D6862 Lupus anticoagulant syndrome: Secondary | ICD-10-CM

## 2016-01-14 DIAGNOSIS — Z86718 Personal history of other venous thrombosis and embolism: Secondary | ICD-10-CM

## 2016-01-14 DIAGNOSIS — Z85830 Personal history of malignant neoplasm of bone: Secondary | ICD-10-CM

## 2016-01-14 LAB — CBC & DIFF AND RETIC
BASO%: 0.3 % (ref 0.0–2.0)
BASOS ABS: 0 10*3/uL (ref 0.0–0.1)
EOS ABS: 0.1 10*3/uL (ref 0.0–0.5)
EOS%: 1.4 % (ref 0.0–7.0)
HEMATOCRIT: 35.3 % (ref 34.8–46.6)
HGB: 11.2 g/dL — ABNORMAL LOW (ref 11.6–15.9)
IMMATURE RETIC FRACT: 8.7 % (ref 1.60–10.00)
LYMPH#: 1.7 10*3/uL (ref 0.9–3.3)
LYMPH%: 24.3 % (ref 14.0–49.7)
MCH: 27.9 pg (ref 25.1–34.0)
MCHC: 31.7 g/dL (ref 31.5–36.0)
MCV: 88 fL (ref 79.5–101.0)
MONO#: 0.7 10*3/uL (ref 0.1–0.9)
MONO%: 9.9 % (ref 0.0–14.0)
NEUT#: 4.5 10*3/uL (ref 1.5–6.5)
NEUT%: 64.1 % (ref 38.4–76.8)
Platelets: 274 10*3/uL (ref 145–400)
RBC: 4.01 10*6/uL (ref 3.70–5.45)
RDW: 12.8 % (ref 11.2–14.5)
RETIC %: 0.78 % (ref 0.70–2.10)
RETIC CT ABS: 31.28 10*3/uL — AB (ref 33.70–90.70)
WBC: 7 10*3/uL (ref 3.9–10.3)

## 2016-01-14 LAB — COMPREHENSIVE METABOLIC PANEL
ALBUMIN: 3 g/dL — AB (ref 3.5–5.0)
ALK PHOS: 122 U/L (ref 40–150)
ALT: 42 U/L (ref 0–55)
AST: 32 U/L (ref 5–34)
Anion Gap: 8 mEq/L (ref 3–11)
BUN: 10.2 mg/dL (ref 7.0–26.0)
CO2: 30 meq/L — AB (ref 22–29)
Calcium: 9.9 mg/dL (ref 8.4–10.4)
Chloride: 101 mEq/L (ref 98–109)
Creatinine: 1 mg/dL (ref 0.6–1.1)
EGFR: 78 mL/min/{1.73_m2} — ABNORMAL LOW (ref >90)
GLUCOSE: 93 mg/dL (ref 70–140)
Potassium: 4.9 mEq/L (ref 3.5–5.1)
SODIUM: 139 meq/L (ref 136–145)
TOTAL PROTEIN: 8.2 g/dL (ref 6.4–8.3)

## 2016-01-14 NOTE — Progress Notes (Signed)
[place]  [phone]) [phone] [phone]) [phone]  HEMATOLOGY CLINIC FOLLOW UP VISIT    ID: Shykia Kilgo OB: [birth year] MR#: [id number] Patient Care Team: Junie Panning, NP as PCP - General (Nurse Practitioner) Kathlee Nations, MD as Consulting Physician (Psychiatry)  REASON FOR OFFICE VISIT:  1. Bilateral lower extremity deep venous thrombosis.  2. Right below-knee amputation for osteogenic sarcoma.  3. Multiple miscarriages.  4. Positive lupus anticoagulant, repeated negative   PATIENT IDENTIFICATION  [name] is a pleasant 50 years old African-American female who is coming to established here at [place]. Previously she was being seen by Dr. Burney Gauze at [facility] location. 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 She had radiation and chemotherapy. Should the surgery with the bone prosthesis. 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 It also causes her some memory problems. She was in coma for a while and she has a tracheostomy done and was placed in a long-term care facility. She has history of multiple miscarriages. She does have 3 children. She also had a right AKA because of the right lower leg infection Iin2013. 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 She was initially evaluated by Dr. Burney Gauze on 12/01/2012. She is wheelchair bound. She also have history of situational depression, iron deficiency anemia and hypothyroidism. 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 He also takes antiseizure therapy is no known family history of thromboembolic problems. She had couple of venous duplex studies done last one was on 09/19/2013 showing similar appearing residual chronic thrombus within the common femoral and femoral veins bilaterally. No significant interval change from 04/04/2013  patient's mother have a machine at home where they check her INR and the target INR is between 2-3. Her current Coumadin dose 10 mg daily and the results are being called out to physician who regulates the Coumadin. Patient and her mother would like to continue the same arrangement although I offered her that we have a dedicated Coumadin clinic and we can help in Coumadin regulation if they desire. Because of a positive lupus anticoagulant, a prior history of osteosarcoma, generalized deconditioning and wheelchair bound status, and presence of chronic thrombus on vascular imaging, Dr. Martha Clan recommended lifelong Coumadin.  INTERIM HISTORY: Cassandra Andrade is back today with her mother for a follow-up. She is doing well overall. She is still not very active, she is able to walk for 30 mins, twice a day, and uses the bathroom and kitchen a few times, but otherwise spends most of time in the chair. She had a tooth 3 weeks ago, still has issues with chewing and eating, no pain. No other new complains.   CURRENT TREATMENT: Coumadin 5mg  on MWF and 7.5mg  on the rest of week, to maintain INR between 2-3  REVIEW OF SYSTEMS: All other 10 point review of systems is negative.    PAST MEDICAL HISTORY: Past Medical History:  Diagnosis Date  . Anxiety   . Arthritis   . Cancer (HCC)    Osteosarcoma, right leg  . CVA (cerebrovascular accident due to intracerebral hemorrhage) ([place]) 2004  . Depression   . DVT (deep venous thrombosis) (HCC)    Left leg  . MRSA (methicillin resistant Staphylococcus aureus)   . S/P BKA (below knee amputation) unilateral ([place])  Right   . Seizures ([place])   . Thyroid disease   . Unspecified epilepsy with intractable epilepsy 02/28/2013   PAST SURGICAL HISTORY: Past Surgical History:  Procedure Laterality Date  . ABOVE KNEE LEG AMPUTATION Right    Osteosarcoma  . BRAIN SURGERY  2004   cerebral aneurysm  . FRACTURE SURGERY     Rt. femur, folllowed by MRSA  . TOTAL KNEE  ARTHROPLASTY Right    FAMILY HISTORY Family History  Problem Relation Age of Onset  . Anxiety disorder Mother   . Diabetes Father    GYNECOLOGIC HISTORY:  No LMP recorded. Patient has had an ablation.   SOCIAL HISTORY:  Social History   Social History  . Marital status: Legally Separated    Spouse name: N/A  . Number of children: 3  . Years of education: GED   Occupational History  . Not on file.   Social History Main Topics  . Smoking status: Never Smoker  . Smokeless tobacco: Never Used     Comment: never used tobacco  . Alcohol use No  . Drug use: No  . Sexual activity: Not Currently   Other Topics Concern  . Not on file   Social History Narrative   Patient lives at home with mom.    Patient has GED.    Patient has 3 children.    Patient is married but in the process of divorced.          ADVANCED DIRECTIVES: <no information>  HEALTH MAINTENANCE: Social History  Substance Use Topics  . Smoking status: Never Smoker  . Smokeless tobacco: Never Used     Comment: never used tobacco  . Alcohol use No   Colonoscopy: PAP: Bone density: Lipid panel:  Allergies  Allergen Reactions  . Vicodin [Hydrocodone-Acetaminophen] Nausea And Vomiting  . Morphine Rash  . Morphine And Related Rash    Allergic reaction only in iv form    Current Outpatient Prescriptions  Medication Sig Dispense Refill  . Ascorbic Acid (VITAMIN C) 1000 MG tablet Take 1,000 mg by mouth daily.     . Calcium Carbonate-Vitamin D 600-400 MG-UNIT per tablet Take 1 tablet by mouth daily.     [name] DIAZEPAM RE Place 20 mg rectally. Rectally as needed for seizure lasting longer than 2 minutes    . lamoTRIgine (LAMICTAL) 200 MG tablet Take 200 mg by mouth 2 (two) times daily.  5  . levETIRAcetam (KEPPRA) 1000 MG tablet TAKE 2 TABLETS (2,000 MG TOTAL) BY MOUTH 2 (TWO) TIMES DAILY. 360 tablet 3  . levothyroxine (SYNTHROID, LEVOTHROID) 50 MCG tablet Take 50 mcg by mouth daily before breakfast.    .  promethazine (PHENERGAN) 25 MG tablet Take 25 mg by mouth every 6 (six) hours as needed. nausea    . venlafaxine XR (EFFEXOR-XR) 75 MG 24 hr capsule TAKE 1 CAPSULE (75 MG TOTAL) BY MOUTH DAILY WITH BREAKFAST. 30 capsule 1  . warfarin (COUMADIN) 5 MG tablet Take 5 mg by mouth 2 (two) times daily. Presently taking alternating dose 1 & 1/2 tab x 4 days & 1 tab x 3 days     No current facility-administered medications for this visit.    OBJECTIVE: Vitals:   01/14/16 1137  BP: 107/66  Pulse: 89  Resp: 17  Temp: 98.7 F (37.1 C)   Body mass index is 26.81 kg/m. ECOG FS: 3 Ocular: Sclerae unicteric, pupils equal, round and reactive to light Ear-nose-throat: Oropharynx clear, dentition fair Lymphatic: No cervical or supraclavicular  adenopathy Lungs no rales or rhonchi, good excursion bilaterally Heart regular rate and rhythm, no murmur appreciated Abd soft, nontender, positive bowel sounds MSK no focal spinal tenderness, no joint edema. (+) right AKA Neuro: non-focal, well-oriented, appropriate affect Breasts: Deferred  LAB RESULTS: CBC Latest Ref Rng & Units 01/14/2016 12/04/2015 05/05/2015  WBC 3.9 - 10.3 10e3/uL 7.0 4.3 3.7(L)  Hemoglobin 11.6 - 15.9 g/dL 11.2(L) - 12.5  Hematocrit 34.8 - 46.6 % 35.3 35.8 38.7  Platelets 145 - 400 10e3/uL 274 201 210    CMP Latest Ref Rng & Units 01/14/2016 12/04/2015 05/05/2015  Glucose 70 - 140 mg/dl 93 151(H) 102  BUN 7.0 - 26.0 mg/dL 10.2 15 13.1  Creatinine 0.6 - 1.1 mg/dL 1.0 1.06(H) 1.2(H)  Sodium 136 - 145 mEq/L 139 143 139  Potassium 3.5 - 5.1 mEq/L 4.9 4.1 3.5  Chloride 96 - 106 mmol/L - 102 -  CO2 22 - 29 mEq/L 30(H) 26 31(H)  Calcium 8.4 - 10.4 mg/dL 9.9 8.9 10.1  Total Protein 6.4 - 8.3 g/dL 8.2 6.7 7.9  Total Bilirubin 0.20 - 1.20 mg/dL <0.22 <0.2 <0.30  Alkaline Phos 40 - 150 U/L 122 119(H) 113  AST 5 - 34 U/L 32 27 23  ALT 0 - 55 U/L 42 27 27    STUDIES: US Venous Img Lower Bilateral  09/19/2013   CLINICAL DATA:  Chronic  bilateral DVT  EXAM: BILATERAL LOWER EXTREMITY VENOUS DOPPLER ULTRASOUND  TECHNIQUE: Gray-scale sonography with graded compression, as well as color Doppler and duplex ultrasound were performed to evaluate the lower extremity deep venous systems from the level of the common femoral vein and including the common femoral, femoral, profunda femoral, popliteal and calf veins including the posterior tibial, peroneal and gastrocnemius veins when visible. The superficial great saphenous vein was also interrogated. Spectral Doppler was utilized to evaluate flow at rest and with distal augmentation maneuvers in the common femoral, femoral and popliteal veins.  COMPARISON:  None.  FINDINGS: Deep veins: Bilateral lower extremity deep veins demonstrate similar recanalized thrombus with mural thrombus seen throughout both common femoral veins and the femoral veins of the thigh. No significant popliteal or tibial thrombus is identified on the left. The patient is status post amputation of the right lower leg.  Compressibility of deep veins: Partially compressible.  Duplex waveform respiratory phasicity:  Diminished but Normal.  Augmentation not performed.  Venous reflux: None visualized.  Other findings: No evidence of superficial thrombophlebitis or abnormal fluid collection.  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 No significant interval change.   Electronically Signed   By: Daryll Brod M.D.   On: 09/19/2013 12:26    ASSESSMENT/PLAN:  Ms. Lesure is a 50 year old African American female.  1. History of bilateral DVT, transient Lupus anticoagulant positive, repeat tests were negative. -Her repeated Doppler showed chronic-appearing DVT in July 2015, which has not changed much. -She is wheelchair bound due to her right lower extremity amputation above knee. She has certainly high risks for thrombosis due to her immobilization.  -I think she likely  need long-term anticoagulation, she agrees with the plan. She asked about other anticoagulation options such a Xeralto, and the pros and cons of Coumadin and Xeralto, especially their reversibility. After the extensive discussion, she decided to stay on Coumadin.  -She is following up with her primary care physician for Coumadin clinic. The patient and her mother still prefer to follow up with me for long-term  anticoagulation management.  -I encouraged her to be more physically active as she tolerates, she is doing better overall, less dependent on her mother.  -She is clinically doing well, we'll continue Coumadin, OK to hold Coumadin before procedure or surgery.  2. Right lower extremity osteosarcoma, status post amputation above knee. -Doing well, no evidence of recurrence. She uses prosthesis.  3. Mild anemia -Her hemoglobin was 11.4 in 10/2014, Iron study reviewed and normal ferritin and serum iron level. Her transferrin saturation is slightly low. -Anemia stable, Hb 11.2 today    Follow-up:  - she will continue Coumadin, and follow-up with her primary care physician for Coumadin monitoring and adjustment  - I'll see her back in 12 months with lab CBC and CMP.   All questions were answered and she is in agreement with the plan. She knows to call here with any questions or concerns and to go to the ED in the event of an emergency. We can certainly see her sooner if need be.   Truitt Merle  01/14/2016

## 2016-01-14 NOTE — Progress Notes (Signed)
Open in error

## 2016-01-14 NOTE — Telephone Encounter (Signed)
Appointments scheduled per 11/22 LOS. Patient given AVS report and calendars with future scheduled appointments. °

## 2016-01-28 ENCOUNTER — Ambulatory Visit: Payer: Medicare Other | Admitting: Nurse Practitioner

## 2016-01-28 ENCOUNTER — Encounter: Payer: Self-pay | Admitting: Nurse Practitioner

## 2016-01-28 ENCOUNTER — Telehealth: Payer: Self-pay | Admitting: Neurology

## 2016-01-28 ENCOUNTER — Ambulatory Visit (INDEPENDENT_AMBULATORY_CARE_PROVIDER_SITE_OTHER): Payer: Medicare Other | Admitting: Nurse Practitioner

## 2016-01-28 VITALS — BP 119/71 | HR 77

## 2016-01-28 DIAGNOSIS — G40909 Epilepsy, unspecified, not intractable, without status epilepticus: Secondary | ICD-10-CM

## 2016-01-28 DIAGNOSIS — G40209 Localization-related (focal) (partial) symptomatic epilepsy and epileptic syndromes with complex partial seizures, not intractable, without status epilepticus: Secondary | ICD-10-CM | POA: Diagnosis not present

## 2016-01-28 DIAGNOSIS — Z86718 Personal history of other venous thrombosis and embolism: Secondary | ICD-10-CM | POA: Diagnosis not present

## 2016-01-28 DIAGNOSIS — G40919 Epilepsy, unspecified, intractable, without status epilepticus: Secondary | ICD-10-CM | POA: Diagnosis not present

## 2016-01-28 MED ORDER — LACOSAMIDE 50 MG PO TABS: 50.0000 mg | ORAL_TABLET | Freq: Two times a day (BID) | ORAL | 5 refills | Status: DC

## 2016-01-28 MED ORDER — LAMOTRIGINE 200 MG PO TABS: 200.0000 mg | ORAL_TABLET | Freq: Two times a day (BID) | ORAL | 6 refills | Status: DC

## 2016-01-28 NOTE — Patient Instructions (Addendum)
Continue Keppra at current dose Continue Lamictal at current dose will refill Add Vimpat 50 mg twice daily Follow-up with Dr. [name] in 4 months for VNS

## 2016-01-28 NOTE — Telephone Encounter (Signed)
There is no need to have the VNS representative present for this visit.

## 2016-01-28 NOTE — Progress Notes (Signed)
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, 50 year old female returns for follow-up. She was seen in the office by Dr. [name] 12/04/2015 and had readjustment of her vagal nerve stimulator. She is continuing to have seizure activity every few days. She is currently on Keppra 2000 mg twice daily and Lamictal 200 mg twice daily. She had previously been on Vimpat was discontinued. She has a history of cerebral aneurysm and right brain stroke. She also has right above-knee amputation. She returns for reevaluation. Overall she is doing better with placement of the vagal nerve stimulator   12/04/15 Cassandra Andrade is a 50 year old patient with a history of intractable epilepsy. The patient has had a vagal nerve stimulator placed, she had a seizure yesterday associated with altered consciousness, no generalized jerking was noted. She comes in today for a readjustment of her vagal nerve stimulator. She has gone from having almost daily seizures to having 1 or 2 seizures a week following the placement of the vagal nerve stimulator. Cassandra Andrade is a 50 year old right-handed black female with a history of intractable epilepsy. The patient has a history of cerebral aneurysm with a right brain stroke. She has been treated with Lamictal, Vimpat, and Keppra, the mother thinks that she has been taken off of one of these medications at [facility], but she is not sure which medication she has stopped. The patient had a seizure yesterday, the vagal nerve stimulator has significantly reduced the frequency of the seizures from almost daily to one or 2 times a week. The patient overall is doing better following the vagal nerve stimulator placement  REVIEW OF SYSTEMS: Full 14 system review of systems performed  and notable only for those listed, all others are neg:  Constitutional: neg  Cardiovascular: neg Ear/Nose/Throat: neg  Skin: neg Eyes: neg Respiratory: neg Gastroitestinal: neg  Hematology/Lymphatic: Easy bruising Endocrine: neg Musculoskeletal:neg Allergy/Immunology: neg Neurological: Seizure Psychiatric: Anxiety depression Sleep : neg   ALLERGIES: Allergies  Allergen Reactions  . Vicodin [Hydrocodone-Acetaminophen] Nausea And Vomiting  . Morphine Rash  . Morphine And Related Rash    Allergic reaction only in iv form    HOME MEDICATIONS: Outpatient Medications Prior to Visit  Medication Sig Dispense Refill  . Ascorbic Acid (VITAMIN C) 1000 MG tablet Take 1,000 mg by mouth daily.     . Calcium Carbonate-Vitamin D 600-400 MG-UNIT per tablet Take 1 tablet by mouth daily.     [name] DIAZEPAM RE Place 20 mg rectally. Rectally as needed for seizure lasting longer than 2 minutes    . lamoTRIgine (LAMICTAL) 200 MG tablet Take 200 mg by mouth 2 (two) times daily.  5  . levETIRAcetam (KEPPRA) 1000 MG tablet TAKE 2 TABLETS (2,000 MG TOTAL) BY MOUTH 2 (TWO) TIMES DAILY. 360 tablet 3  . levothyroxine (SYNTHROID, LEVOTHROID) 50 MCG tablet Take 50 mcg by mouth daily before breakfast.    . promethazine (PHENERGAN) 25 MG tablet Take 25 mg by mouth every 6 (six) hours as needed. nausea    . venlafaxine XR (EFFEXOR-XR) 75 MG 24 hr capsule TAKE 1 CAPSULE (75 MG TOTAL) BY MOUTH DAILY WITH BREAKFAST. 30 capsule 1  . warfarin (COUMADIN) 5 MG tablet Take 5 mg by mouth 2 (two) times daily. Presently taking alternating dose 1 & 1/2 tab x 4 days & 1 tab x 3  days     No facility-administered medications prior to visit.     PAST MEDICAL HISTORY: Past Medical History:  Diagnosis Date  . Anxiety   . Arthritis   . Cancer (HCC)    Osteosarcoma, right leg  . CVA (cerebrovascular accident due to intracerebral hemorrhage) ([place]) 2004  . Depression   . DVT (deep venous thrombosis) (HCC)    Left leg  .  MRSA (methicillin resistant Staphylococcus aureus)   . 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 Seizures ([place])   . Thyroid disease   . 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 ABOVE KNEE LEG AMPUTATION Right    Osteosarcoma  . BRAIN SURGERY  2004   cerebral aneurysm  . FRACTURE SURGERY     Rt. femur, folllowed by MRSA  . TOTAL KNEE ARTHROPLASTY Right     FAMILY HISTORY: Family History  Problem Relation Age of Onset  . Anxiety disorder Mother   . Diabetes Father     SOCIAL HISTORY: Social History   Social History  . Marital status: Legally Separated    Spouse name: N/A  . Number of children: 3  . Years of education: GED   Occupational History  . Not on file.   Social History Main Topics  . Smoking status: Never Smoker  . Smokeless tobacco: Never Used     Comment: never used tobacco  . Alcohol use No  . Drug use: No  . Sexual activity: Not Currently   Other Topics Concern  . Not on file   Social History Narrative   Patient lives at home with mom.    Patient has GED.    Patient has 3 children.    Patient is married but in the process of divorced.            PHYSICAL EXAM  Vitals:   01/28/16 1511  BP: 119/71  Pulse: 77   There is no height or weight on file to calculate BMI. General: The patient is alert and cooperative at the time of the examination. Skin: No significant peripheral edema is noted. The patient has an above-knee amputation on the right.   Neurologic Exam  Mental status: The patient is alert and oriented x 3 at the time of the examination. The patient has apparent normal recent and remote memory, with an apparently normal attention span and concentration ability. Cranial nerves: Facial symmetry is present. Speech is normal, no aphasia or dysarthria is noted. Extraocular movements are full. Visual fields are notable  for a dense left homonymous visual field deficit. Motor: The patient has good strength in all 4 extremities, with exception of some mild weakness of the left upper extremity. Sensory examination: Soft touch sensation is symmetric on the face, arms, and legs. Coordination: The patient has good finger-nose-finger bilaterally. Gait and station: The patient has a right above-knee amputation, she was not ambulated. She is in a wheelchair. Reflexes: Deep tendon reflexes are symmetric.   DIAGNOSTIC DATA (LABS, IMAGING, TESTING) - I reviewed patient records, labs, notes, testing and imaging myself where available.  Lab Results  Component Value Date   WBC 7.0 01/14/2016   HGB 11.2 (L) 01/14/2016   HCT 35.3 01/14/2016   MCV 88.0 01/14/2016   PLT 274 01/14/2016      Component Value Date/Time   NA 139 01/14/2016 1122   K 4.9 01/14/2016 1122  CL 102 12/04/2015 1626   CL 97 (L) 07/11/2013 1346   CO2 30 (H) 01/14/2016 1122   GLUCOSE 93 01/14/2016 1122   GLUCOSE 126 (H) 07/11/2013 1346   BUN 10.2 01/14/2016 1122   CREATININE 1.0 01/14/2016 1122   CALCIUM 9.9 01/14/2016 1122   PROT 8.2 01/14/2016 1122   ALBUMIN 3.0 (L) 01/14/2016 1122   AST 32 01/14/2016 1122   ALT 42 01/14/2016 1122   ALKPHOS 122 01/14/2016 1122   BILITOT <0.22 01/14/2016 1122   GFRNONAA 62 12/04/2015 1626   GFRAA 71 12/04/2015 1626   No results found for: CHOL, HDL, LDLCALC, LDLDIRECT, TRIG, CHOLHDL Lab Results  Component Value Date   HGBA1C (L) 12/26/2009    4.3 (NOTE)                                                                       According to the ADA Clinical Practice Recommendations for 2011, when HbA1c is used as a screening test:   >=6.5%   Diagnostic of Diabetes Mellitus           (if abnormal result  is confirmed)  5.7-6.4%   Increased risk of developing Diabetes Mellitus  References:Diagnosis and Classification of Diabetes Mellitus,Diabetes Care,2011,34(Suppl 1):S62-S69 and Standards of Medical Care  in         Diabetes - 2011,Diabetes [id number]  (Suppl 1):S11-S61.     ASSESSMENT AND PLAN  50 y.o. year old female  has a past medical history of  CVA (cerebrovascular accident due to intracerebral hemorrhage) ([place]) (2004);  DVT (deep venous thrombosis) (HCC); MRSA (methicillin resistant Staphylococcus aureus); S/P BKA (below knee amputation)  Unspecified epilepsy with intractable epilepsy here To follow-up.   Discussed with Dr. [name] Continue Keppra at current dose Continue Lamictal at current dose Add Vimpat 50 mg twice daily Follow-up with Dr. [name] in 4 months for VNS interrogation Dennie Bible, [facility], [facility], [facility] [address] [place], [place] [address] [phone]

## 2016-01-28 NOTE — Telephone Encounter (Signed)
VNS AND APPT WILLIS ON 06/08/16 AT 12:00. PLEASE SCHEDULE VNS REP WITH PT ON THIS DATE.

## 2016-01-28 NOTE — Progress Notes (Signed)
I have read the note, and I agree with the clinical assessment and plan.  WILLIS,CHARLES KEITH   

## 2016-02-03 ENCOUNTER — Ambulatory Visit (HOSPITAL_COMMUNITY): Payer: Self-pay | Admitting: Psychiatry

## 2016-02-03 DIAGNOSIS — H25013 Cortical age-related cataract, bilateral: Secondary | ICD-10-CM | POA: Diagnosis not present

## 2016-02-03 DIAGNOSIS — H5340 Unspecified visual field defects: Secondary | ICD-10-CM | POA: Diagnosis not present

## 2016-02-03 DIAGNOSIS — H5201 Hypermetropia, right eye: Secondary | ICD-10-CM | POA: Diagnosis not present

## 2016-02-03 DIAGNOSIS — H5212 Myopia, left eye: Secondary | ICD-10-CM | POA: Diagnosis not present

## 2016-02-03 DIAGNOSIS — H353111 Nonexudative age-related macular degeneration, right eye, early dry stage: Secondary | ICD-10-CM | POA: Diagnosis not present

## 2016-02-03 DIAGNOSIS — H35372 Puckering of macula, left eye: Secondary | ICD-10-CM | POA: Diagnosis not present

## 2016-02-03 DIAGNOSIS — H11423 Conjunctival edema, bilateral: Secondary | ICD-10-CM | POA: Diagnosis not present

## 2016-02-03 DIAGNOSIS — H2513 Age-related nuclear cataract, bilateral: Secondary | ICD-10-CM | POA: Diagnosis not present

## 2016-02-03 DIAGNOSIS — H40013 Open angle with borderline findings, low risk, bilateral: Secondary | ICD-10-CM | POA: Diagnosis not present

## 2016-02-03 DIAGNOSIS — H11153 Pinguecula, bilateral: Secondary | ICD-10-CM | POA: Diagnosis not present

## 2016-02-03 DIAGNOSIS — H04123 Dry eye syndrome of bilateral lacrimal glands: Secondary | ICD-10-CM | POA: Diagnosis not present

## 2016-02-03 DIAGNOSIS — H18413 Arcus senilis, bilateral: Secondary | ICD-10-CM | POA: Diagnosis not present

## 2016-02-09 ENCOUNTER — Ambulatory Visit (INDEPENDENT_AMBULATORY_CARE_PROVIDER_SITE_OTHER): Payer: Medicare Other | Admitting: Psychology

## 2016-02-09 DIAGNOSIS — F331 Major depressive disorder, recurrent, moderate: Secondary | ICD-10-CM | POA: Diagnosis not present

## 2016-02-09 NOTE — Progress Notes (Signed)
   THERAPIST PROGRESS NOTE  Session Time: 10.05am-10.50am  Participation Level: Active  Behavioral Response: Well GroomedAlertDepressed  Type of Therapy: Individual Therapy  Treatment Goals addressed: Diagnosis: MDD and goal 1.  Interventions: CBT, Strength-based and Supportive  Summary: Cassandra Andrade is a 50 y.o. female who presents with depressed mood and affect.  Pt reported that she has been feeling down and hopeless. Pt expressed feeling like a failure as "hasn't made the progress she should" w/ walking and that she is a burden and causing worry for her mom.  W/ therapist assistance pt was able to identify as distortions and identify depressed thinking.  Pt identified that she is setting the bar to high for self and not focusing on accepting where she is and how to set realistic goals for self and reframed thinking.   Suicidal/Homicidal: Nowithout intent/plan  Therapist Response: Assessed pt current functioning per pt report.  Processed w/pt connection of thoughts and moods.  Explored w/pt self compassion and how sh would respond to a love one w/ those thoughts.  Assisted pt in identifying reframes and encouraging thoughts and realistic goal setting.   Plan: Return again in 2 weeks.  Diagnosis: MDD   Cassandra Andrade, [facility] 02/09/2016

## 2016-02-25 ENCOUNTER — Ambulatory Visit (INDEPENDENT_AMBULATORY_CARE_PROVIDER_SITE_OTHER): Payer: Medicare Other | Admitting: Psychiatry

## 2016-02-25 ENCOUNTER — Encounter (HOSPITAL_COMMUNITY): Payer: Self-pay | Admitting: Psychiatry

## 2016-02-25 DIAGNOSIS — Z79899 Other long term (current) drug therapy: Secondary | ICD-10-CM

## 2016-02-25 DIAGNOSIS — Z888 Allergy status to other drugs, medicaments and biological substances status: Secondary | ICD-10-CM

## 2016-02-25 DIAGNOSIS — Z833 Family history of diabetes mellitus: Secondary | ICD-10-CM

## 2016-02-25 DIAGNOSIS — Z9889 Other specified postprocedural states: Secondary | ICD-10-CM

## 2016-02-25 DIAGNOSIS — F321 Major depressive disorder, single episode, moderate: Secondary | ICD-10-CM | POA: Diagnosis not present

## 2016-02-25 DIAGNOSIS — Z818 Family history of other mental and behavioral disorders: Secondary | ICD-10-CM | POA: Diagnosis not present

## 2016-02-25 MED ORDER — VENLAFAXINE HCL ER 75 MG PO CP24: 75.0000 mg | ORAL_CAPSULE | Freq: Every day | ORAL | 2 refills | Status: DC

## 2016-02-25 NOTE — Progress Notes (Signed)
BH MD/PA/NP OP Progress Note  02/25/2016 10:57 AM Cassandra Andrade  MRN:  [id number]  Chief Complaint:  Chief Complaint    Follow-up     Subjective:  I'm doing good.  I like Effexor.  HPI: Cassandra Andrade came for her follow-up appointment with her mother.  She is compliant with Effexor and reported no side effects.  She is seeing much improvement with Effexor and reported no side effects.  She is less anxious and less depressed.  She continues to have some time rumination and feeling burdened to her mother.  She admitted not using prosthesis every day despite recommended.  She admitted some time having arguments with the mother insists to use prosthesis every day.  She is seeing Cassandra Andrade for counseling.  She admitted those negative thoughts or less intense and less frequent and she is having positive approach towards her life.  She is excited because her 3 boys came from [place] on the Christmas and she had a good time.  Recently she see neurologist for the management of seizures.  She continues to have seizures despite taking 3 antiseizure medicine.  She lives with her mother who also supervises her medication.  Her appetite is okay.  Overall she lost the weight because she is more active and ambulatory.  Patient denies drinking alcohol or using any illegal substances.  Her energy level is fair.  Her vital signs are stable.  Patient denies any feeling of hopelessness or any worthlessness.  She denies any mania, psychosis or any hallucination.  Visit Diagnosis:    ICD-9-CM ICD-10-CM   1. Major depressive disorder, single episode, moderate (HCC) 296.22 F32.1 venlafaxine XR (EFFEXOR-XR) 75 MG 24 hr capsule    Past Psychiatric History: Reviewed  Past Medical History:  Past Medical History:  Diagnosis Date  . Anxiety   . Arthritis   . Cancer (HCC)    Osteosarcoma, right leg  . CVA (cerebrovascular accident due to intracerebral hemorrhage) ([name]) 2004  . Depression   . DVT (deep venous thrombosis)  (HCC)    Left leg  . MRSA (methicillin resistant Staphylococcus aureus)   . 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 Seizures ([place])   . Thyroid disease   . 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 ABOVE KNEE LEG AMPUTATION Right    Osteosarcoma  . BRAIN SURGERY  2004   cerebral aneurysm  . FRACTURE SURGERY     Rt. femur, folllowed by MRSA  . TOTAL KNEE ARTHROPLASTY Right     Family Psychiatric History: Reviewed  Family History:  Family History  Problem Relation Age of Onset  . Anxiety disorder Mother   . Diabetes Father     Social History:  Social History   Social History  . Marital status: Legally Separated    Spouse name: N/A  . Number of children: 3  . Years of education: GED   Social History Main Topics  . Smoking status: Never Smoker  . Smokeless tobacco: Never Used     Comment: never used tobacco  . Alcohol use No  . Drug use: No  . Sexual activity: Not Currently   Other Topics Concern  . None   Social History Narrative   Patient lives at home with mom.    Patient has GED.    Patient has 3 children.    Patient is married but in the process of divorced.  Allergies:  Allergies  Allergen Reactions  . Vicodin [Hydrocodone-Acetaminophen] Nausea And Vomiting  . Morphine Rash  . Morphine And Related Rash    Allergic reaction only in iv form    Metabolic Disorder Labs: Lab Results  Component Value Date   HGBA1C (L) 12/26/2009    4.3 (NOTE)                                                                       According to the ADA Clinical Practice Recommendations for 2011, when HbA1c is used as a screening test:   >=6.5%   Diagnostic of Diabetes Mellitus           (if abnormal result  is confirmed)  5.7-6.4%   Increased risk of developing Diabetes Mellitus  References:Diagnosis and Classification of Diabetes Mellitus,Diabetes [id number]  1):S62-S69 and Standards of Medical Care in         Diabetes - 2011,Diabetes Care,2011,34  (Suppl 1):S11-S61.   MPG 77 12/26/2009   Lab Results  Component Value Date   PROLACTIN 16.8 02/17/2013   No results found for: CHOL, TRIG, HDL, CHOLHDL, VLDL, LDLCALC   Current Medications: Current Outpatient Prescriptions  Medication Sig Dispense Refill  . Ascorbic Acid (VITAMIN C) 1000 MG tablet Take 1,000 mg by mouth daily.     . Calcium Carbonate-Vitamin D 600-400 MG-UNIT per tablet Take 1 tablet by mouth daily.     [name] DIAZEPAM RE Place 20 mg rectally. Rectally as needed for seizure lasting longer than 2 minutes    . lacosamide (VIMPAT) 50 MG TABS tablet Take 1 tablet (50 mg total) by mouth 2 (two) times daily. 60 tablet 5  . lamoTRIgine (LAMICTAL) 200 MG tablet Take 1 tablet (200 mg total) by mouth 2 (two) times daily. 60 tablet 6  . levETIRAcetam (KEPPRA) 1000 MG tablet TAKE 2 TABLETS (2,000 MG TOTAL) BY MOUTH 2 (TWO) TIMES DAILY. 360 tablet 3  . levothyroxine (SYNTHROID, LEVOTHROID) 50 MCG tablet Take 50 mcg by mouth daily before breakfast.    . promethazine (PHENERGAN) 25 MG tablet Take 25 mg by mouth every 6 (six) hours as needed. nausea    . venlafaxine XR (EFFEXOR-XR) 75 MG 24 hr capsule Take 1 capsule (75 mg total) by mouth daily with breakfast. 30 capsule 2  . warfarin (COUMADIN) 5 MG tablet Take 5 mg by mouth 2 (two) times daily. Presently taking alternating dose 1 & 1/2 tab x 4 days & 1 tab x 3 days     No current facility-administered medications for this visit.     Neurologic: Headache: No Seizure: Yes Paresthesias: Tingling  Musculoskeletal: Strength & Muscle Tone: decreased Gait & Station: unsteady, Patient has a right lower knee amputation.  She uses wheelchair. Patient leans: See above  Psychiatric Specialty Exam: Review of Systems  Musculoskeletal:       Right below-knee amputation   Skin: Negative for itching and rash.  Neurological: Positive for tingling.   Psychiatric/Behavioral: Negative.     Blood pressure 110/68, pulse 72, height 5' 7.5" (1.715 m), weight 175 lb (79.4 kg).Body mass index is 27 kg/m.  General Appearance: Casual  Eye Contact:  Good  Speech:  Slow  Volume:  Decreased  Mood:  Euthymic  Affect:  Appropriate  Thought Process:  Goal Directed  Orientation:  Full (Time, Place, and Person)  Thought Content: Rumination   Suicidal Thoughts:  No  Homicidal Thoughts:  No  Memory:  Immediate;   Good Recent;   Good Remote;   Good  Judgement:  Good  Insight:  Good  Psychomotor Activity:  Normal  Concentration:  Concentration: Fair and Attention Span: Fair  Recall:  [place] of Knowledge: Good  Language: Good  Akathisia:  No  Handed:  Right  AIMS (if indicated):  None reported   Assets:  Communication Skills Desire for Improvement Housing  ADL's:  Intact  Cognition: WNL  Sleep:  Fair      Assessment ; Cassandra Andrade is a 51 year old African-American female who came for her follow-up appointment for the management of major depressive disorder .  Plan ; Patient is a stable on Effexor XR 75 mg daily.  Discussed medication side effects and benefits.  Encouraged to use prosthesis to gain more mobility and freedom .  Explain in the beginning cause some difficulty but eventually it helped to regain her independence and mobility.  Encouraged to continue counseling with [name].  Discussed medication side effects and benefits.  Recommended to call us back if she has any question, concern if she feel worsening of the symptom.  Follow-up in 3 months.  Zoye Chandra T., MD 02/25/2016, 10:57 AM

## 2016-03-03 DIAGNOSIS — Z86718 Personal history of other venous thrombosis and embolism: Secondary | ICD-10-CM | POA: Diagnosis not present

## 2016-03-04 ENCOUNTER — Ambulatory Visit (HOSPITAL_COMMUNITY): Payer: Self-pay | Admitting: Psychology

## 2016-03-12 ENCOUNTER — Ambulatory Visit (HOSPITAL_COMMUNITY): Payer: Self-pay | Admitting: Psychology

## 2016-03-30 ENCOUNTER — Ambulatory Visit (INDEPENDENT_AMBULATORY_CARE_PROVIDER_SITE_OTHER): Payer: Medicare Other | Admitting: Psychology

## 2016-03-30 DIAGNOSIS — F330 Major depressive disorder, recurrent, mild: Secondary | ICD-10-CM

## 2016-03-31 DIAGNOSIS — Z86718 Personal history of other venous thrombosis and embolism: Secondary | ICD-10-CM | POA: Diagnosis not present
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
## 2016-04-07 DIAGNOSIS — Z7901 Long term (current) use of anticoagulants: Secondary | ICD-10-CM | POA: Diagnosis not present

## 2016-04-07 DIAGNOSIS — F339 Major depressive disorder, recurrent, unspecified: Secondary | ICD-10-CM | POA: Diagnosis not present

## 2016-04-07 DIAGNOSIS — Z01818 Encounter for other preprocedural examination: Secondary | ICD-10-CM | POA: Diagnosis not present

## 2016-04-07 DIAGNOSIS — Z89611 Acquired absence of right leg above knee: Secondary | ICD-10-CM | POA: Diagnosis not present

## 2016-04-07 DIAGNOSIS — G40909 Epilepsy, unspecified, not intractable, without status epilepticus: Secondary | ICD-10-CM | POA: Diagnosis not present

## 2016-04-27 ENCOUNTER — Ambulatory Visit (INDEPENDENT_AMBULATORY_CARE_PROVIDER_SITE_OTHER): Payer: Medicare Other | Admitting: Psychology

## 2016-04-27 DIAGNOSIS — F3341 Major depressive disorder, recurrent, in partial remission: Secondary | ICD-10-CM

## 2016-04-27 NOTE — Progress Notes (Signed)
   THERAPIST PROGRESS NOTE  Session Time: 11.09am-11.50am  Participation Level: Active  Behavioral Response: Well GroomedAlertEuthymic  Type of Therapy: Individual Therapy  Treatment Goals addressed: Diagnosis: MDD and goal 1.  Interventions: CBT and Supportive  Summary: Cassandra Andrade is a 51 y.o. female who presents with full and bright affect.  Pt reports mood has generally been good- occasional depressed moods that don't last.  Pt reports that she feels lonely at times but is talking w/ supports and friends and this is helpful.  Pt reported on stressors of living w/ mom- at times mom critical of how she chooses to do things.  Pt is able to speak up for self and also not internalize.  Pt discussed goals and progress- updated plan. Pt reported that excited about upcoming dental work.  Pt also discussed that she continues to use her prothesis and that ran out of time to prepare to wear today to appointment.  Pt reported that felt good about preparing her own breakfast when aide not present the other day.   Suicidal/Homicidal: Nowithout intent/plan  Therapist Response: Assessed pt current functioning per pt report.  Processed w/pt mood and stressors.  Explored dynamics of living w/ mom who at times has to assist pt and how this impacts.  Discussed celebrating daily progress and how this is important to continuing motivation.    Plan: Return again in 4-6 weeks.  Diagnosis: MDD, recurrent in partial remission    Carless Slatten, LPC 04/27/2016

## 2016-04-28 DIAGNOSIS — Z86718 Personal history of other venous thrombosis and embolism: Secondary | ICD-10-CM | POA: Diagnosis not present

## 2016-05-24 ENCOUNTER — Ambulatory Visit (HOSPITAL_COMMUNITY): Payer: Self-pay | Admitting: Psychology

## 2016-05-26 DIAGNOSIS — Z86718 Personal history of other venous thrombosis and embolism: Secondary | ICD-10-CM | POA: Diagnosis not present

## 2016-06-01 ENCOUNTER — Ambulatory Visit (INDEPENDENT_AMBULATORY_CARE_PROVIDER_SITE_OTHER): Payer: Medicare Other | Admitting: Psychiatry

## 2016-06-01 ENCOUNTER — Encounter (HOSPITAL_COMMUNITY): Payer: Self-pay | Admitting: Psychiatry

## 2016-06-01 DIAGNOSIS — Z79899 Other long term (current) drug therapy: Secondary | ICD-10-CM | POA: Diagnosis not present

## 2016-06-01 DIAGNOSIS — Z818 Family history of other mental and behavioral disorders: Secondary | ICD-10-CM

## 2016-06-01 DIAGNOSIS — F321 Major depressive disorder, single episode, moderate: Secondary | ICD-10-CM | POA: Diagnosis not present

## 2016-06-01 MED ORDER — VENLAFAXINE HCL ER 75 MG PO CP24: 75.0000 mg | ORAL_CAPSULE | Freq: Every day | ORAL | 2 refills | Status: DC
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
## 2016-06-02 ENCOUNTER — Ambulatory Visit (HOSPITAL_COMMUNITY): Payer: Self-pay | Admitting: Psychology

## 2016-06-08 ENCOUNTER — Ambulatory Visit: Payer: Medicare Other | Admitting: Neurology

## 2016-06-08 ENCOUNTER — Ambulatory Visit: Payer: Self-pay | Admitting: Neurology

## 2016-06-08 ENCOUNTER — Telehealth: Payer: Self-pay | Admitting: *Deleted

## 2016-06-08 ENCOUNTER — Ambulatory Visit (INDEPENDENT_AMBULATORY_CARE_PROVIDER_SITE_OTHER): Payer: Medicare Other | Admitting: Neurology

## 2016-06-08 ENCOUNTER — Encounter: Payer: Self-pay | Admitting: Neurology

## 2016-06-08 VITALS — BP 122/76 | HR 97 | Ht 67.5 in | Wt 175.5 lb

## 2016-06-08 DIAGNOSIS — R569 Unspecified convulsions: Secondary | ICD-10-CM | POA: Diagnosis not present

## 2016-06-08 DIAGNOSIS — G40119 Localization-related (focal) (partial) symptomatic epilepsy and epileptic syndromes with simple partial seizures, intractable, without status epilepticus: Secondary | ICD-10-CM

## 2016-06-08 MED ORDER — LAMOTRIGINE 25 MG PO TABS: ORAL_TABLET | ORAL | 1 refills | Status: DC

## 2016-06-08 NOTE — Patient Instructions (Signed)
   We will increase the Lamictal adding a 25 mg tablet, one twice a day for 2 weeks, then take 2 tablets twice a day.

## 2016-06-08 NOTE — Telephone Encounter (Signed)
Called and scheduled appt for today at 430pm, check in 400pm. Mother verbalized understanding.

## 2016-06-08 NOTE — Procedures (Signed)
     History: Cassandra Andrade is a 51 year old patient with a history of intractable epilepsy. The patient has had a vagal nerve stimulator placed, she had a seizure yesterday associated with altered consciousness, no generalized jerking was noted. She comes in today for a readjustment of her vagal nerve stimulator. She has gone from having almost daily seizures to having 1 or 2 seizures a week following the placement of the vagal nerve stimulator.   VAGAL NERVE STIMULATOR SETTINGS:  Output current: 1.5 milliamps  Signal frequency: 30 Hz  Pulse width: 250 microseconds  On time: 30 seconds  Off time: 3.0 minutes changed to 1.8 minutes  Magnet current: 1.75 milliamps  Magnet on time: 60 seconds  Pulse width: 250 microseconds  Lead impedance: 2695 Ohms  IFI: No  The VNS unit was interrogated, and the settings were changed as above. The patient appeared to tolerate the changes well.  Jill Alexanders MD 12/04/2015 4:18 PM  [facility] [address] [place], [place] [address]  Phone [phone] Fax [phone]

## 2016-06-08 NOTE — Progress Notes (Signed)
Reason for visit: Seizures  Cassandra Andrade is an 51 y.o. female  History of present illness:  Ms. Storrs is a 51 year old right-handed black female with a history of a seizure disorder with intractable epilepsy. The patient has a vagal nerve stimulator that has been placed. The patient still averages about 2 seizures a week. The patient has a sensation of a buzzing inside the head prior to the onset of the seizure, this may allow her to sit down before the seizure occurs. The patient is able to swipe her magnet often times. She has not had any falls or injuries because of the seizures. During the seizure, she may fumble with her fingers and hands, and be confused. The patient does not operate a motor vehicle. She has a prosthetic right leg. She walks with a walker. She is on Vimpat, Lamictal, and Keppra. She is tolerating these medications fairly well. The last Lamictal level done 6 months ago was 5.9.  Past Medical History:  Diagnosis Date  . Anxiety   . Arthritis   . Cancer (HCC)    Osteosarcoma, right leg  . CVA (cerebrovascular accident due to intracerebral hemorrhage) ([place]) 2004  . Depression   . DVT (deep venous thrombosis) (HCC)    Left leg  . MRSA (methicillin resistant Staphylococcus aureus)   . 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 Seizures ([place])   . Thyroid disease   . 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 ABOVE KNEE LEG AMPUTATION Right    Osteosarcoma  . BRAIN SURGERY  2004   cerebral aneurysm  . FRACTURE SURGERY     Rt. femur, folllowed by MRSA  . TOTAL KNEE ARTHROPLASTY Right     Family History  Problem Relation Age of Onset  . Anxiety disorder Mother   . Diabetes Father     Social history:  reports that she has never smoked. She has never used smokeless tobacco. She reports that she does not drink alcohol or use drugs.    Allergies  Allergen Reactions  .  Vicodin [Hydrocodone-Acetaminophen] Nausea And Vomiting  . Morphine Rash  . Morphine And Related Rash    Allergic reaction only in iv form    Medications:  Prior to Admission medications   Medication Sig Start Date End Date Taking? Authorizing Provider  Ascorbic Acid (VITAMIN C) 1000 MG tablet Take 1,000 mg by mouth daily.  08/17/11  Yes Historical Provider, MD  Calcium Carbonate-Vitamin D 600-400 MG-UNIT per tablet Take 1 tablet by mouth daily.    Yes Historical Provider, MD  DIAZEPAM RE Place 20 mg rectally. Rectally as needed for seizure lasting longer than 2 minutes   Yes Historical Provider, MD  lacosamide (VIMPAT) 50 MG TABS tablet Take 1 tablet (50 mg total) by mouth 2 (two) times daily. 01/28/16  Yes Dennie Bible, NP  lamoTRIgine (LAMICTAL) 200 MG tablet Take 1 tablet (200 mg total) by mouth 2 (two) times daily. 01/28/16  Yes Dennie Bible, NP  levETIRAcetam (KEPPRA) 1000 MG tablet TAKE 2 TABLETS (2,000 MG TOTAL) BY MOUTH 2 (TWO) TIMES DAILY. 09/01/15  Yes Kathrynn Ducking, MD  levothyroxine (SYNTHROID, LEVOTHROID) 50 MCG tablet Take 50 mcg by mouth daily before breakfast.   Yes Historical Provider, MD  promethazine (PHENERGAN) 25 MG tablet Take 25 mg by mouth every 6 (six) hours as needed. nausea   Yes Historical Provider, MD  venlafaxine XR (EFFEXOR-XR) 75 MG 24 hr capsule Take 1 capsule (75 mg total) by mouth daily with breakfast. 06/01/16  Yes Kathlee Nations, MD  warfarin (COUMADIN) 5 MG tablet Take 5 mg by mouth 2 (two) times daily. Presently taking alternating dose 1 & 1/2 tab x 4 days & 1 tab x 3 days   Yes Historical Provider, MD    ROS:  Out of a complete 14 system review of symptoms, the patient complains only of the following symptoms, and all other reviewed systems are negative.  Seizures Gait disorder  Blood pressure 122/76, pulse 97, height 5' 7.5" (1.715 m), weight 175 lb 8 oz (79.6 kg).  Physical Exam  General: The patient is alert and cooperative at  the time of the examination.  Skin: No significant peripheral edema is noted on the left, the patient has a prosthetic right leg.   Neurologic Exam  Mental status: The patient is alert and oriented x 3 at the time of the examination. The patient has apparent normal recent and remote memory, with an apparently normal attention span and concentration ability.   Cranial nerves: Facial symmetry is present. Speech is normal, no aphasia or dysarthria is noted. Extraocular movements are full. Visual fields are full.  Motor: The patient has good strength in all 4 extremities, with exception that the right leg is prosthetic.  Sensory examination: Soft touch sensation is symmetric on the face, arms, and legs.  Coordination: The patient has good finger-nose-finger bilaterally.  Gait and station: The patient has a wide-based gait, she walks with a walker. No drift is seen.  Reflexes: Deep tendon reflexes are symmetric, but the patient has a prosthetic right leg.   Assessment/Plan:  1. History of seizures, intractable  The patient continues to have frequent seizure events, she does not fall with the seizures and she remains safe. The patient will be increased on the Lamictal working up to a 250 mg twice daily dose. She had an adjustment to her vagal nerve stimulator today. She will follow-up in 6 months.  Jill Alexanders MD 06/08/2016 5:10 PM  [facility] [address] [place], [place] [address]  Phone [phone] Fax [phone]

## 2016-06-08 NOTE — Telephone Encounter (Signed)
Returned call to pt to notify that power was back on. Spoke to mother who agreed w/ 1:30 appt this afternoon.

## 2016-06-08 NOTE — Telephone Encounter (Signed)
Called and spoke with patient's mother.  Advised office closed, no power. Have to cx apt today. Once office back open, will call back to r/s. Apologized for any inconvenience. She verbalized understanding and appreciation for call.

## 2016-06-08 NOTE — Telephone Encounter (Signed)
Called back. Spoke with mother. Advised we do not have any water. Power is on, but no plumbing. Have to cx. Apologized again for having to cx. Will call once we know more and can r/s. She verbalized understanding.

## 2016-06-10 ENCOUNTER — Other Ambulatory Visit: Payer: Self-pay | Admitting: *Deleted

## 2016-06-10 MED ORDER — LAMOTRIGINE 200 MG PO TABS: 200.0000 mg | ORAL_TABLET | Freq: Two times a day (BID) | ORAL | 1 refills | Status: DC

## 2016-06-11 ENCOUNTER — Telehealth: Payer: Self-pay

## 2016-06-11 DIAGNOSIS — F321 Major depressive disorder, single episode, moderate: Secondary | ICD-10-CM

## 2016-06-11 NOTE — Telephone Encounter (Signed)
received  a fax requesting a 90 day refill on the venlafaxine hcl er 75mg  cap.   looks like a rx was sent eprescriped on  06-01-16 #30 with 2 refills.

## 2016-06-14 MED ORDER — VENLAFAXINE HCL ER 75 MG PO CP24: 75.0000 mg | ORAL_CAPSULE | Freq: Every day | ORAL | 0 refills | Status: DC

## 2016-06-14 NOTE — Telephone Encounter (Signed)
90 day order sent 

## 2016-06-14 NOTE — Telephone Encounter (Signed)
Okay to do 90 day supply? 

## 2016-06-23 ENCOUNTER — Ambulatory Visit (INDEPENDENT_AMBULATORY_CARE_PROVIDER_SITE_OTHER): Payer: Medicare Other | Admitting: Psychology

## 2016-06-23 DIAGNOSIS — F330 Major depressive disorder, recurrent, mild: Secondary | ICD-10-CM

## 2016-06-23 DIAGNOSIS — Z86718 Personal history of other venous thrombosis and embolism: Secondary | ICD-10-CM | POA: Diagnosis not present

## 2016-06-23 NOTE — Progress Notes (Signed)
   THERAPIST PROGRESS NOTE  Session Time: 10.05am-11am  Participation Level: Active  Behavioral Response: Well GroomedAlertaffect wnl  Type of Therapy: Individual Therapy  Treatment Goals addressed: Diagnosis: MDD and goal 1.  Interventions: CBT and Supportive  Summary: Cassandra Andrade is a 51 y.o. female who presents with affect WNL.  Pt reported that depressed mood has been less.  Pt reported that she didn't wear her prosthetic as she has chafing on other leg where stocking rubs. Pt reports she is doing her daily practice walking around her house and feels good about this.  Pt reports at times mom discouraging w/ comments of how she should be further along- but pt is reframing. Pt discussed struggles w/ assertiveness in past.  Pt reported that has felt good to assert self w/ her home aide.  Pt also reported mom even stood up for her and this felt good.   Suicidal/Homicidal: Nowithout intent/plan  Therapist Response: Assessed pt current functioning per pt report.  Processed w/pt coping w/ depression.  Explored w/ pt her asserting self and confidence assist to build.   Plan: Return again in 1 month.  Diagnosis: MDD, recurrent, mild    YATES,LEANNE, LPC 06/23/2016

## 2016-07-21 DIAGNOSIS — Z86718 Personal history of other venous thrombosis and embolism: Secondary | ICD-10-CM | POA: Diagnosis not present

## 2016-07-23 DIAGNOSIS — Z89611 Acquired absence of right leg above knee: Secondary | ICD-10-CM | POA: Diagnosis not present

## 2016-07-23 DIAGNOSIS — L219 Seborrheic dermatitis, unspecified: Secondary | ICD-10-CM | POA: Diagnosis not present

## 2016-07-23 DIAGNOSIS — Z1231 Encounter for screening mammogram for malignant neoplasm of breast: Secondary | ICD-10-CM | POA: Diagnosis not present

## 2016-07-29 DIAGNOSIS — Z1231 Encounter for screening mammogram for malignant neoplasm of breast: Secondary | ICD-10-CM | POA: Diagnosis not present

## 2016-08-04 ENCOUNTER — Ambulatory Visit (INDEPENDENT_AMBULATORY_CARE_PROVIDER_SITE_OTHER): Payer: Medicare Other | Admitting: Psychology

## 2016-08-04 DIAGNOSIS — F330 Major depressive disorder, recurrent, mild: Secondary | ICD-10-CM

## 2016-08-04 NOTE — Progress Notes (Signed)
   THERAPIST PROGRESS NOTE  Session Time: 11.05am-11.49am  Participation Level: Active  Behavioral Response: Well GroomedAlertaffect bright  Type of Therapy: Individual Therapy  Treatment Goals addressed: Diagnosis: MDD and goal 1.  Interventions: Strength-based and Supportive  Summary: Cassandra Andrade is a 51 y.o. female who presents with full and bright affect. Pt was using her prosthetic to walk today.  Pt reported that she was able to assert w/ mom that although slow moving she is doing well.  Pt reports she is asserting herself more. Pt discussed how asserting w/ her aide.  Pt reported that her mood overall has been good- coupled days here and there where feels more down.  Pt is focusing on finding things to laugh about.  Pt reports that she is looking forward to getting her teeth next month.   Suicidal/Homicidal: Nowithout intent/plan  Therapist Response: Assessed pt current functioning per pt report.  Processed w/pt her assertiveness and positive impact for resolving interactions as well as self worth.    Plan: Return again in 1 month.  Diagnosis: MDD, mild    Stephan Nelis, LPC 08/04/2016

## 2016-08-09 ENCOUNTER — Other Ambulatory Visit: Payer: Self-pay | Admitting: *Deleted

## 2016-08-09 MED ORDER — LACOSAMIDE 50 MG PO TABS: 50.0000 mg | ORAL_TABLET | Freq: Two times a day (BID) | ORAL | 5 refills | Status: DC

## 2016-08-09 NOTE — Telephone Encounter (Signed)
Saw Dr. [name] in 06/08/16.   Next appt with CM/NP in 11/2016.  No change in vimpat dose.

## 2016-08-09 NOTE — Telephone Encounter (Signed)
received fax confirmation [facility] [phone]. sy

## 2016-08-18 DIAGNOSIS — Z86718 Personal history of other venous thrombosis and embolism: Secondary | ICD-10-CM | POA: Diagnosis not present

## 2016-08-19 ENCOUNTER — Telehealth: Payer: Self-pay | Admitting: Neurology

## 2016-08-23 ENCOUNTER — Other Ambulatory Visit: Payer: Self-pay | Admitting: Neurology

## 2016-08-31 ENCOUNTER — Ambulatory Visit (INDEPENDENT_AMBULATORY_CARE_PROVIDER_SITE_OTHER): Payer: Medicare Other | Admitting: Psychiatry

## 2016-08-31 ENCOUNTER — Encounter (HOSPITAL_COMMUNITY): Payer: Self-pay | Admitting: Psychiatry

## 2016-08-31 DIAGNOSIS — Z818 Family history of other mental and behavioral disorders: Secondary | ICD-10-CM | POA: Diagnosis not present

## 2016-08-31 DIAGNOSIS — F321 Major depressive disorder, single episode, moderate: Secondary | ICD-10-CM | POA: Diagnosis not present

## 2016-08-31 MED ORDER — VENLAFAXINE HCL ER 75 MG PO CP24: 75.0000 mg | ORAL_CAPSULE | Freq: Every day | ORAL | 0 refills | Status: DC

## 2016-08-31 NOTE — Progress Notes (Signed)
BH MD/PA/NP OP Progress Note  08/31/2016 11:06 AM Cassandra Andrade  MRN:  [id number]  Chief Complaint:  Subjective:  I'm sleeping better.  HPI: Patient came for her follow-up appointment with her mother.  She is taking Effexor and denies any side effects.  Recently she got dentures and she has noticed more confidence in herself.  She is happy and smiling.  She admitted some time having issues with her caretaker who comes every day but no major agitation or anger.  She continues to struggle to walk with prosthesis but it is better than before.  She denies any crying spells, paranoia, hallucination or any feeling of hopelessness or worthlessness.  She is pleased that her seizures is also less intense and less frequent.  She is taking multiple medication for seizures.  She has no tremors shakes or any EPS.  Her energy level is good.  She lives with her mother who is very supportive and supervises her medication.  Patient denies drinking alcohol or using any illegal substances.  Visit Diagnosis:    ICD-10-CM   1. Major depressive disorder, single episode, moderate (HCC) F32.1 venlafaxine XR (EFFEXOR-XR) 75 MG 24 hr capsule    Past Psychiatric History: Reviewed. Patient has at least one psychiatric admission in 2011 due to overdose on her pain medication. At that time she has a marital conflict and having argument with her husband. She denies any history of mania psychosis hallucination. In the past she has given Zoloft and Remeron however she do not remember the details. She remembered taking Lexapro and Abilify but it was stopped because lack of response. She took Prozac for a long time until recently it stopped working.  Past Medical History:  Past Medical History:  Diagnosis Date  . Anxiety   . Arthritis   . Cancer (HCC)    Osteosarcoma, right leg  . CVA (cerebrovascular accident due to intracerebral hemorrhage) ([place]) 2004  . Depression   . DVT (deep venous thrombosis) (HCC)    Left leg   . MRSA (methicillin resistant Staphylococcus aureus)   . 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 Seizures ([place])   . Thyroid disease   . 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 ABOVE KNEE LEG AMPUTATION Right    Osteosarcoma  . BRAIN SURGERY  2004   cerebral aneurysm  . FRACTURE SURGERY     Rt. femur, folllowed by MRSA  . TOTAL KNEE ARTHROPLASTY Right     Family Psychiatric History: Reviewed.  Family History:  Family History  Problem Relation Age of Onset  . Anxiety disorder Mother   . Diabetes Father     Social History:  Social History   Social History  . Marital status: Legally Separated    Spouse name: N/A  . Number of children: 3  . Years of education: GED   Social History Main Topics  . Smoking status: Never Smoker  . Smokeless tobacco: Never Used     Comment: never used tobacco  . Alcohol use No  . Drug use: No  . Sexual activity: Not Currently   Other Topics Concern  . Not on file   Social History Narrative   Patient lives at home with mom.    Patient has GED.    Patient has 3 children.    Patient is married but in the process of divorced.  Allergies:  Allergies  Allergen Reactions  . Vicodin [Hydrocodone-Acetaminophen] Nausea And Vomiting  . Morphine Rash  . Morphine And Related Rash    Allergic reaction only in iv form    Metabolic Disorder Labs: Lab Results  Component Value Date   HGBA1C (L) 12/26/2009    4.3 (NOTE)                                                                       According to the ADA Clinical Practice Recommendations for 2011, when HbA1c is used as a screening test:   >=6.5%   Diagnostic of Diabetes Mellitus           (if abnormal result  is confirmed)  5.7-6.4%   Increased risk of developing Diabetes Mellitus  References:Diagnosis and Classification of Diabetes Mellitus,Diabetes ACZY,6063,01(SWFUX 1):S62-S69 and  Standards of Medical Care in         Diabetes - 2011,Diabetes Care,2011,34  (Suppl 1):S11-S61.   MPG 77 12/26/2009   Lab Results  Component Value Date   PROLACTIN 16.8 02/17/2013   No results found for: CHOL, TRIG, HDL, CHOLHDL, VLDL, LDLCALC   Current Medications: Current Outpatient Prescriptions  Medication Sig Dispense Refill  . Ascorbic Acid (VITAMIN C) 1000 MG tablet Take 1,000 mg by mouth daily.     . Calcium Carbonate-Vitamin D 600-400 MG-UNIT per tablet Take 1 tablet by mouth daily.     [name] DIAZEPAM RE Place 20 mg rectally. Rectally as needed for seizure lasting longer than 2 minutes    . lacosamide (VIMPAT) 50 MG TABS tablet Take 1 tablet (50 mg total) by mouth 2 (two) times daily. 60 tablet 5  . lamoTRIgine (LAMICTAL) 200 MG tablet Take 1 tablet (200 mg total) by mouth 2 (two) times daily. 180 tablet 1  . lamoTRIgine (LAMICTAL) 25 MG tablet One tablet twice a day for 2 weeks, then take 2 tablets twice a day 360 tablet 1  . levETIRAcetam (KEPPRA) 1000 MG tablet TAKE 2 TABLETS (2,000 MG TOTAL) BY MOUTH 2 (TWO) TIMES DAILY. 360 tablet 3  . levothyroxine (SYNTHROID, LEVOTHROID) 50 MCG tablet Take 50 mcg by mouth daily before breakfast.    . promethazine (PHENERGAN) 25 MG tablet Take 25 mg by mouth every 6 (six) hours as needed. nausea    . venlafaxine XR (EFFEXOR-XR) 75 MG 24 hr capsule Take 1 capsule (75 mg total) by mouth daily with breakfast. 90 capsule 0  . warfarin (COUMADIN) 5 MG tablet Take 5 mg by mouth 2 (two) times daily. Presently taking alternating dose 1 & 1/2 tab x 4 days & 1 tab x 3 days     No current facility-administered medications for this visit.     Neurologic: Headache: No Seizure: Yes Paresthesias: Tingling  Musculoskeletal: Strength & Muscle Tone: decreased Gait & Station: unsteady, Amputation right leg Patient leans: See above  Psychiatric Specialty Exam: ROS  Blood pressure 124/72, pulse 90, height 5' 7.5" (1.715 m), weight 137 lb 6.4 oz (62.3  kg).There is no height or weight on file to calculate BMI.  General Appearance: Casual  Eye Contact:  Good  Speech:  Clear and Coherent  Volume:  Normal  Mood:  Anxious  Affect:  Congruent  Thought Process:  Goal Directed  Orientation:  Full (Time, Place, and Person)  Thought Content: Logical   Suicidal Thoughts:  No  Homicidal Thoughts:  No  Memory:  Immediate;   Good Recent;   Good Remote;   Good  Judgement:  Good  Insight:  Good  Psychomotor Activity:  Normal  Concentration:  Concentration: Fair and Attention Span: Fair  Recall:  [place] of Knowledge: Good  Language: Good  Akathisia:  No  Handed:  Right  AIMS (if indicated):  0  Assets:  Communication Skills Desire for Improvement Housing Resilience Social Support  ADL's:  Intact  Cognition: WNL  Sleep:  Good     Assessment: Major depressive disorder, recurrent.  Plan: Patient is doing better on Effexor.  She has no side effects.  I will continue Effexor XR 75 mg daily.  She will continue counseling with [name].  Recommended to call us back if she has any question or any concern.  Follow-up in 3 months.  Jasan Doughtie T., MD 08/31/2016, 11:06 AM

## 2016-09-01 DIAGNOSIS — Z89611 Acquired absence of right leg above knee: Secondary | ICD-10-CM | POA: Diagnosis not present

## 2016-09-15 ENCOUNTER — Ambulatory Visit (INDEPENDENT_AMBULATORY_CARE_PROVIDER_SITE_OTHER): Payer: Medicare Other | Admitting: Psychology

## 2016-09-15 DIAGNOSIS — F330 Major depressive disorder, recurrent, mild: Secondary | ICD-10-CM | POA: Diagnosis not present

## 2016-09-15 DIAGNOSIS — Z86718 Personal history of other venous thrombosis and embolism: Secondary | ICD-10-CM | POA: Diagnosis not present

## 2016-09-15 NOTE — Progress Notes (Signed)
   THERAPIST PROGRESS NOTE  Session Time: 11.05am-11.50am  Participation Level: Active  Behavioral Response: Well GroomedAlertDepressed  Type of Therapy: Individual Therapy  Treatment Goals addressed: Diagnosis: MDD and goal 1.  Interventions: CBT and Supportive  Summary: Cassandra Andrade is a 51 y.o. female who presents with report of some depressed mood and affect congruent.  Pt reports that she is not using her prosthetic because not working right- want bend to take step.  Pt reports she has to f/u w/ physical therapist and get new one. Pt reported that mom is discouraging by making statements that she doesn't want to walk.  Pt gets upset and will internalize this at times.  Pt was able to identify ways to challenge and acknowledge struggles that she faces and ways she is working to overcome.  Pt is happy about her dentures received and feels good about accomplishing that goal .  .   Suicidal/Homicidal: Nowithout intent/plan  Therapist Response: Assessed pt current functioning per pt report. Processed w/pt depressed mood, thoughts and discouraging words.  Explored w/pt ways for self talk to challenge and reframe to continue encouraging positive growth.   Plan: Return again in 4 weeks.  Diagnosis: MDD    Jan Fireman, [facility] 09/15/2016

## 2016-10-05 ENCOUNTER — Ambulatory Visit: Payer: Medicare Other | Attending: Family | Admitting: Physical Therapy

## 2016-10-05 ENCOUNTER — Encounter: Payer: Self-pay | Admitting: Physical Therapy

## 2016-10-05 VITALS — BP 143/79 | HR 108

## 2016-10-05 DIAGNOSIS — R2689 Other abnormalities of gait and mobility: Secondary | ICD-10-CM

## 2016-10-05 DIAGNOSIS — M6281 Muscle weakness (generalized): Secondary | ICD-10-CM | POA: Diagnosis not present

## 2016-10-05 NOTE — Therapy (Signed)
[facility] [address] [place], [place], [address] Phone: [phone]   Fax:  [phone]  Physical Therapy Evaluation  Patient Details  Name: Stepfanie Yott MRN: [id number] Date of Birth: 01-07-1966 Referring Provider: Junie Panning, NP  Encounter Date: 10/05/2016      PT End of Session - 10/05/16 1520    Visit Number 1   Number of Visits 1   PT Start Time 1015   PT Stop Time 1104   PT Time Calculation (min) 49 min   Equipment Utilized During Treatment Gait belt   Activity Tolerance Patient tolerated treatment well   Behavior During Therapy WFL for tasks assessed/performed      Past Medical History:  Diagnosis Date   Anxiety    Arthritis    Cancer ([place])    Osteosarcoma, right leg   CVA (cerebrovascular accident due to intracerebral hemorrhage) ([place]) 2004   Depression    DVT (deep venous thrombosis) ([facility])    Left leg   MRSA (methicillin resistant Staphylococcus aureus)    S/P BKA (below knee amputation) unilateral ([place])    Right    Seizures ([place])    Thyroid disease    Unspecified epilepsy with intractable epilepsy 02/28/2013    Past Surgical History:  Procedure Laterality Date   ABOVE KNEE LEG AMPUTATION Right    Osteosarcoma   BRAIN SURGERY  2004   cerebral aneurysm   FRACTURE SURGERY     Rt. 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 In 2014, she underwent PT for prosthetic training and was able to ambulate with family using prosthesis usanding a walker. She had issues with bilateral LE DVTs and had to stop walking  in Sept. 2014. She received a socket revision with new suspension system on 04/18/2014, and she underwent PT at that time for prosthetic training to increase mobility. Since then, she has lost 30 pounds and reports that the leg no longer fits well, is heavy, and no longer flexes at the knee articulation. She presents to OPPT for an evaluation for a new prosthesis.   Patient is accompained by: Family member  mother   Pertinent History h/o R AKA, MRSA, aneurysm/CVA with left hemiparesis, memory deficits, B DVTs of LEs, osteosarcoma, hypothyroidism, seizure disorder, anxiety/depression   Limitations Walking;Standing;House hold activities   How long can you sit comfortably? an hour or more   How long can you stand comfortably? 15 minutes with support   How long can you walk comfortably? 30 minutes with rollator   Patient Stated Goals New prosthesis; pt reports current one is heavy and when it bends it "goes"   Currently in Pain? No/denies            [facility] PT Assessment - 10/05/16 1015      Assessment   Medical Diagnosis R AKA/hx of CVA   Referring Provider Junie Panning, NP   Onset Date/Surgical Date 09/01/16  NP office visit with PT referral   Hand Dominance Right   Prior Therapy yes  in 2016  Precautions   Precautions Fall;Other (comment)  Seizure disorder and neurostimulator     Restrictions   Weight Bearing Restrictions No     Balance Screen   Has the patient fallen in the past 6 months No   Has the patient had a decrease in activity level because of a fear of falling?  No   Is the patient reluctant to leave their home because of a fear of falling?  No     Home Ecologist residence   Transport planner;Other (Comment)  neice   Type of [place] entrance  1 step to enter at [place] One level   [place] - 4 wheels;Walker - standard;Cane - single point;Tub bench;Grab bars -  tub/shower;Grab bars - toilet;Wheelchair - manual;Hospital bed     Prior Function   Level of Independence Requires assistive device for independence;Independent with household mobility with device;Independent with community mobility with device;Other (comment)  prosthesis and rollator   Vocation On disability     Cognition   Overall Cognitive Status Within Functional Limits for tasks assessed   Attention --     Observation/Other Assessments   Skin Integrity --   Focus on Therapeutic Outcomes (FOTO)  35.82 Functional Status   Activities of Balance Confidence Scale (ABC Scale)  17.5%     ROM / Strength   AROM / PROM / Strength Strength     PROM   PROM Assessment Site --   Right/Left Hip --     Strength   Overall Strength Within functional limits for tasks performed   Overall Strength Comments Gross LE WFL due to patient able to amubulate with rollator and ascend/descend stairs     Right Hip   Right Hip Extension --     Flexibility   Soft Tissue Assessment /Muscle Length yes  full right passive hip extension in [place] position     Transfers   Transfers Sit to Stand;Stand to Sit   Sit to Stand 6: Modified independent (Device/Increase time);With upper extremity assist;With armrests;From chair/3-in-1  to rolling walker   Sit to Stand Details (indicate cue type and reason) MinA for LOB when attempting to stand without UE assist; modified independent when using UEs on armrests.    Stand to Sit 6: Modified independent (Device/Increase time);With upper extremity assist;With armrests;To chair/3-in-1  from rolling walker   Stand to Sit Details (indicate cue type and reason) --     Ambulation/Gait   Ambulation/Gait Yes   Ambulation/Gait Assistance 6: Modified independent (Device/Increase time)   Ambulation/Gait Assistance Details --   Ambulation Distance (Feet) 200 Feet   Assistive device Rollator;Prosthesis   Gait Pattern Right hip hike;Poor foot clearance - right;Poor foot  clearance - left;Step-to pattern;Decreased step length - left;Decreased stance time - right   Ambulation Surface Level;Indoor   Gait velocity 0.91 ft/sec   Stairs Yes   Stairs Assistance 4: Min guard   Stairs Assistance Details (indicate cue type and reason) MinGuard for safety/LOB   Stair Management Technique Two rails;Step to pattern;Forwards   Number of Stairs 4   Ramp 4: Min assist  rollator walker & prosthesis   Ramp Details (indicate cue type and reason) Verbal cues to manage walker   Curb 4: Min assist  rollator walker & prosthesis   Curb Details (indicate cue type and reason) Verbal cues to manage walker         Prosthetics Assessment - 10/05/16 1015  Prosthetics   Prosthetic Care Independent with Skin check;Care of non-amputated limb;Residual limb care;Prosthetic cleaning;Ply sock cleaning;Correct ply sock adjustment;Proper wear schedule/adjustment;Proper weight-bearing schedule/adjustment   Prosthetic Care Comments  22/47 on AmpPro (indicates K1 level, but patient presented to PT at K2 level ambulating with prosthesis and rollator)   Donning prosthesis  Independent   Doffing prosthesis  Independent   Current prosthetic wear tolerance (days/week)  7 days/week   Current prosthetic wear tolerance (#hours/day)  12 hours   Current prosthetic weight-bearing tolerance (hours/day)  tolerating Full WB   Edema none    Residual limb condition  Skin on residual limb has no open areas, good hair growth, normal temperature, well-healed non-adhered scar   K code/activity level with prosthetic use  K2  community ambulation fixed cadence        AMPUTEE MOBILITY PREDICTOR ASSESSMENT TOOL Initial instructions: Client is seated in a hard chair with arms. The following manoeuvres are tested with or without the use of the prosthesis.  Advise the person of each task or group of tasks prior to performance.  Please avoid unnecessary chatter throughout the test.  Safety First, no task should  be performed if either the tester or client is uncertain of a safe outcome.  1. Sitting Balance: Sit forward in a chair with arms folded across chest for 60s. Cannot sit upright independently for 60s Can sit upright independently for 60s = 0 = 1  1  2. Sitting reach:  Reach forwards and grasp the ruler.  (Tester holds ruler 12in beyond extended arms midline to the sternum) Does not attempt Cannot grasp or requires arm support Reaches forward and successfully grasps item.  = 0 = 1  = 2    2  3. Chair to chair transfer: 2 chairs at 53. Pt. may choose direction and use their upper limbs. Cannot do or requires physical assistance Performs independently, but appears unsteady Performs independently, appears to be steady and safe = 0  = 1 = 2  2  4. Arises from a chair: Ask pt. to fold arms across chest and stand. If unable, use arms or assistive device. Unable without help (physical assistance) Able, uses arms/assist device to help Able, without using arms = 0  = 1 = 2   1  5. Attempts to arise from a chair: (stopwatch ready) If attempt in no. 4. was without arms then ignore and allow another attempt without penalty. Unable without help (physical assistance) Able requires >1 attempt Able to rise one attempt = 0  = 1 = 2   1  6. Immediate Standing Balance: (first 5s) Begin timing immediately. Unsteady (staggers, moves foot, sways ) Steady using walking aid or other support Steady without walker or other support = 0 = 1  = 2  1  7. Standing Balance (30s): (stopwatch ready) For item no.s 7 & 8, first attempt is without assistive device.  If support is required allow after first attempt Unsteady Steady but uses walking aid or other support Standing without support = 0  = 1 = 2    1  8. Single limb standing balance: (stopwatch ready) Time the duration of single limb standing on both the sound and prosthetic limb up to 30s.   Grade the quality, not the  time.  *Eliminate item 8 for AMPnoPRO*  Sound side  30 seconds  Prosthetic side 30 seconds Non-prosthetic side Unsteady Steady but uses walking aid or other support for 30s Single-limb standing without support for 30s  Prosthetic Side Unsteady Steady but uses walking aid or other support for 30s Single-limb standing without support for 30s  = 0 = 1  = 2    = 0  = 1  = 2    1     1   9. Standing reach: Reach forward and grasp the ruler.  (Tester holds ruler 12in beyond extended arm(s) midline to the sternum) Does not attempt Cannot grasp or requires arm support on assistive device Reaches forward and successfully grasps item no support = 0  = 1  = 2   1  10. Nudge test: With feet as close together as possible, examiner pushes lightly on pt.s sternum with palm of hand 3 times (toes should rise) Begins to fall Staggers, grabs, catches self ore uses assistive device Steady = 0  = 1 = 2   0  11. Eyes Closed: (at maximum position #7) If support is required grade as unsteady. Unsteady or grips assistive device Steady without any use of assistive device = 0  = 1  0   12. Pick up objects off the floor: Pick up a pencil off the floor placed midline 12in in front of foot. Unable to pick up object and return to standing Performs with some help (table, chair, walking aid etc) Performs independently (without help) = 0  = 1   = 2  1  13. Sitting down:  Ask pt. to fold arms across chest and sit. If unable, use arm or assistive device. Unsafe (misjudged distance, falls into chair ) Uses arms, assistive device or not a smooth motion Safe, smooth motion = 0  = 1 = 2   1  14. Initiation of gait: (immediately after told to go) Any hesitancy or multiple attempts to start No hesitancy = 0 = 1  0  15. Step length and height: Walk a measured distance of 26ft twice (up and back). Four scores are required or two scores (a. & b.) for each leg. Marked  deviation is defined as extreme substitute movements to avoid clearing the floor. a. Swing Foot Does not advance a minimum of 12in Advances a minimum of 12in  b. Foot Clearance Foot does not completely clear floor without deviation Foot completely clears floor without marked deviation  = 0  = 1   = 0 = 1 Prosthesis  1   1 Sound   0   1  16. Step Continuity Stopping or discontinuity between steps (stop & go gait) Steps appear continuous = 0  = 1  0  17. Turning:  180 degree turn when returning to chair. Unable to turn, requires intervention to prevent falling Greater than three steps but completes task without intervention No more than three continuous steps with or without assistive aid = 0  = 1  = 2    0  18. Variable cadence:  Walk a distance of 69ft fast as possible safely 4 times.  (Speeds may vary from slow to fast and fast to slow varying cadence) Unable to vary cadence in a controlled manner Asymmetrical increase in cadence controlled manner Symmetrical increase in speed in a controlled manner  = 0 = 1 = 2      0  19. Stepping over an obstacle: Place a movable box of 4in in height in the walking path. Cannot step over the box Catches foot, interrupts stride Steps over without interrupting stride = 0 = 1 = 2   1  20. Stairs (must have  at least 2 steps):  Try to go up and down these stairs without holding on to the railing.  Dont hesitate to permit pt. to hold on to rail.  Safety First, if examiner feels that any risk in involved omit and score as 0.  Ascending Unsteady, cannot do One step at a time, or must hold on to railing or device Step over step, does not hold onto the railing or device  Descending Unsteady, cannot do One step at a time, or must hold on to railing or device Step over step, does not hold onto the railing or device  = 0  = 1 = 2    = 0  = 1 = 2   1     1   21. Assistive device selection:  Add points  for the use of an assistive device if used for two or more items.  If testing without prosthesis use of appropriate assistive device is mandatory.   Bed bound Wheelchair / Parallel Bars Walker Crutches (axillary or forearm) Cane (straight or quad) None = 0 = 1 = 2 = 3 = 4 = 5    2    Total Score      AMPPRO      22 /47     K LEVEL (converted from AMP score)  AMPnoPRO   K0 = (0-8)    K1= (9-20)    K2 = (21-28)    K3 = (29-36)    K4 = (37-43)  AMPPRO    K1 = (15-26)      K2 = (27-36)     K3 = (37-42)     K4 = (43-47)      Objective measurements completed on examination: See above findings.                  PT Education - 10/05/16 1516    Education provided Yes   Education Details PT clinical findings and prosthetic recommendation, weight of a prosthesis with microprocessor knee & requires K3 (full community with variable cadence) to qualify   Person(s) Educated Patient;Parent(s)   Methods Explanation;Verbal cues   Comprehension Verbalized understanding                     Plan - 10/05/16 1537    Clinical Impression Statement This 51yo female underwent a Right Transfemoral Amputation in 2012 or 2013. She recieved her first prosthesis ~2014 and underwent a socket revision with new suspension design in Feb 2016. She has lost 20-30# since socket revision which makes prosthesis ill-fitting and suspension loss. Her knee & foot are out of warranty which is why she is having difficulty with being able to lock/unlock her knee at times. Patient has been successfully using a prosthesis in her home modified independent with rollator walker and in community with her mothers supervision/assistance. Her ROM, strength and cardio reactions are within functional limits for safe mobility. AmpPro test 22/47 indicates K1 (household level ambulation). However patient is able & does ambulate at community level with fixed cadence which is a K2 level. She negotiated ramps &  curbs with rollator walker & prosthesis and stairs with 2 rails with minimal assist safely. Patient would benefit from a new prosthesis with recommended K2 level components: Silicon liner with suction ring suspension, Ischial Containment Socket with flexible inner liner, Weight activated knee unit and single axis foot. Patient would benefit from further PT once she receives new prosthesis to learn how to properly use new  knee design for community level functions.    History and Personal Factors relevant to plan of care: h/o R AKA, MRSA, aneurysm/CVA with left hemiparesis, memory deficits, B DVTs of LEs, osteosarcoma, hypothyroidism, seizure disorder, anxiety/depression, falls risk   Clinical Presentation Evolving   Clinical Presentation due to: balance issues due to prior CVA and poor socket fit, seizure disorder, prior medical history, risk of falls   Clinical Decision Making Moderate   Rehab Potential Good   PT Frequency One time visit   PT Treatment/Interventions Patient/family education   PT Next Visit Plan Prosthetic Prescrption evaluation only   Recommended Other Services K2 prosthesis with Silicon liner with suction ring suspension, Ischial Containment Socket with flexible inner liner, Weight activated knee unit and single axis foot.    Consulted and Agree with Plan of Care Patient;Family member/caregiver   Family Member Consulted mother      Patient will benefit from skilled therapeutic intervention in order to improve the following deficits and impairments:  Abnormal gait, Decreased activity tolerance, Decreased balance, Decreased mobility, Decreased endurance, Decreased strength, Difficulty walking, Prosthetic Dependency  Visit Diagnosis: Other abnormalities of gait and mobility  Muscle weakness (generalized)      G-Codes - 10/30/2016 1602    Functional Assessment Tool Used (Outpatient Only) Amputee Mobility Predictor Assessment Tool (22/47); pt ambulates household with RW modified  independent & community with rollator & prosthesis with minimal assist.    Functional Limitation Mobility: Walking and moving around   Mobility: Walking and Moving Around Current Status [phone]) At least 60 percent but less than 80 percent impaired, limited or restricted   Mobility: Walking and Moving Around Goal Status [phone]) At least 60 percent but less than 80 percent impaired, limited or restricted   Mobility: Walking and Moving Around Discharge Status [phone]) At least 60 percent but less than 80 percent impaired, limited or restricted       Problem List Patient Active Problem List   Diagnosis Date Noted   History of deep venous thrombosis 05/05/2015   Personal history of osteosarcoma 05/05/2015   Anemia 11/04/2014   Abnormal finding on mammography 05/18/2014   Major depression, recurrent ([place]) 03/19/2014   Vision disturbance 02/28/2014   Postprocedural state 12/31/2013   History of biliary T-tube placement 12/31/2013   Lupus anticoagulant disorder ([place]) 12/20/2013   Back pain, chronic 11/07/2013   Absence of bladder continence 08/05/2013   History of anticoagulant therapy 07/17/2013   Long term current use of anticoagulant therapy 07/17/2013   Partial epilepsy with impairment of consciousness ([place]) 06/21/2013   Intractable epilepsy ([place]) 02/28/2013   Absolute anemia 02/28/2013   Seizures ([place]) 02/21/2013   Seizure ([place]) 02/17/2013   DVT, bilateral lower limbs ([place]) 11/07/2012   Hypothyroid 07/04/2012   Seizure disorder ([place]) 07/04/2012   Chronic pain syndrome 07/04/2012   S/P BKA (below knee amputation) ([place]) 07/04/2012   Chronic pain associated with significant psychosocial dysfunction 07/04/2012   Cerebrovascular accident, late effects 06/05/2012   Status post above knee amputation ([place]) 06/08/2011   Mechanical complication of graft of bone, cartilage, muscle or tendon 05/10/2011   Depression 05/03/2011   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 [place], [place], [address] Phone: [phone]   Fax:  [phone]  Name: Deneen Slager MRN: [id number] Date of Birth: 1965/11/26

## 2016-10-08 ENCOUNTER — Other Ambulatory Visit: Payer: Self-pay | Admitting: Neurology

## 2016-10-13 ENCOUNTER — Ambulatory Visit (INDEPENDENT_AMBULATORY_CARE_PROVIDER_SITE_OTHER): Payer: Medicare Other | Admitting: Psychology

## 2016-10-13 DIAGNOSIS — F321 Major depressive disorder, single episode, moderate: Secondary | ICD-10-CM | POA: Diagnosis not present

## 2016-10-13 DIAGNOSIS — Z86718 Personal history of other venous thrombosis and embolism: Secondary | ICD-10-CM | POA: Diagnosis not present

## 2016-10-13 NOTE — Progress Notes (Signed)
   THERAPIST PROGRESS NOTE  Session Time: 11.01am-11.50am  Participation Level: Active  Behavioral Response: Well GroomedAlerttearfful  Type of Therapy: Family Therapy  Treatment Goals addressed: Diagnosis: MDD and goal 1.  Interventions: CBT and Supportive  Summary: Cassandra Andrade is a 51 y.o. female who presents with affect depressed, pt tearful at times.  Mom mentions in lobby pt tearful today.  Pt expressed feeling discouraged hearing mom's words that she is slower w/ walking than a year ago.  Pt reported she went for evaluation of new prosthetic leg- pt doesn't recall all the physical therapist recommendation- but feels that mom reminding her that she is walking slower than should.  pt did express wanting mom in session to express feelings.  Mom joined session.  Pt able to express that she feels discouraged when hears mom state she needs to walk faster and that feels a burden.  Mom is able to clarify that she isn't intending to put pt down but to encouage and reflecting change.  Pt and mom where able to acknowledge that physical therapy assessment did recommend for new prosthetic and that current one is impacting her ease of mobility as no longer fits.  mom acknowledges need for working on way she communicates at times and encouraging to pt about acceptance.  Pt acknowledges need to work on not putting self down and acceptance.   Suicidal/Homicidal: Nowithout intent/plan  Therapist Response: Assessed pt current functioning per pt report. Facilitated pt and parent communication re: feelings, communication and how to support and encourage each other.   Plan: Return again in 3 weeks.  Diagnosis: MDD   Jan Fireman, [facility] 10/13/2016

## 2016-11-03 ENCOUNTER — Ambulatory Visit: Payer: Medicare Other | Admitting: Nurse Practitioner

## 2016-11-03 ENCOUNTER — Encounter: Payer: Self-pay | Admitting: Neurology

## 2016-11-03 ENCOUNTER — Ambulatory Visit (INDEPENDENT_AMBULATORY_CARE_PROVIDER_SITE_OTHER): Payer: Medicare Other | Admitting: Neurology

## 2016-11-03 ENCOUNTER — Telehealth: Payer: Self-pay | Admitting: *Deleted

## 2016-11-03 VITALS — BP 140/86 | HR 82 | Ht 67.5 in

## 2016-11-03 DIAGNOSIS — G40209 Localization-related (focal) (partial) symptomatic epilepsy and epileptic syndromes with complex partial seizures, not intractable, without status epilepticus: Secondary | ICD-10-CM | POA: Diagnosis not present

## 2016-11-03 MED ORDER — LACOSAMIDE 100 MG PO TABS: 100.0000 mg | ORAL_TABLET | Freq: Two times a day (BID) | ORAL | 5 refills | Status: DC

## 2016-11-03 NOTE — Progress Notes (Signed)
Reason for visit: Seizures  Cassandra Andrade is an 51 y.o. female  History of present illness:  Cassandra Andrade is a 51 year old right-handed black female with a history of seizures with intractable epilepsy. The patient has had a prior intracranial hemorrhage. The patient is on Keppra, Lamictal, and Vimpat. She is still having at least 2 seizures a week, occasionally she may have 2 seizures in one day. The seizures are nonconvulsive in nature, the patient has a sensation in her head just prior to the seizure. She is able to swipe the magnet on her vagal nerve stimulator. The patient no longer feels the cycling of the stimulations. The patient is tolerating her anticonvulsant medications well. She does not sleep much at night, however. She returns to this office for an evaluation. Occasionally, the patient may go four days without a seizure but this is unusual. The patient gives a long-standing history of nausea that is chronic, she takes Phenergan on a regular basis. If there is any movement in her visual field, this exacerbates the nausea.  Past Medical History:  Diagnosis Date  . Anxiety   . Arthritis   . Cancer (HCC)    Osteosarcoma, right leg  . CVA (cerebrovascular accident due to intracerebral hemorrhage) ([place]) 2004  . Depression   . DVT (deep venous thrombosis) (HCC)    Left leg  . MRSA (methicillin resistant Staphylococcus aureus)   . 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 Seizures ([place])   . Thyroid disease   . 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 ABOVE KNEE LEG AMPUTATION Right    Osteosarcoma  . BRAIN SURGERY  2004   cerebral aneurysm  . FRACTURE SURGERY     Rt. femur, folllowed by MRSA  . TOTAL KNEE ARTHROPLASTY Right     Family History  Problem Relation Age of Onset  . Anxiety disorder Mother   . Diabetes Father     Social history:  reports that she has never smoked.  She has never used smokeless tobacco. She reports that she does not drink alcohol or use drugs.    Allergies  Allergen Reactions  . Vicodin [Hydrocodone-Acetaminophen] Nausea And Vomiting  . Morphine Rash  . Morphine And Related Rash    Allergic reaction only in iv form    Medications:  Prior to Admission medications   Medication Sig Start Date End Date Taking? Authorizing Provider  Ascorbic Acid (VITAMIN C) 1000 MG tablet Take 1,000 mg by mouth daily.  08/17/11  Yes [provider]  Calcium Carbonate-Vitamin D 600-400 MG-UNIT per tablet Take 1 tablet by mouth daily.    Yes [provider]  DIAZEPAM RE Place 20 mg rectally. Rectally as needed for seizure lasting longer than 2 minutes   Yes [provider]  lacosamide (VIMPAT) 50 MG TABS tablet Take 1 tablet (50 mg total) by mouth 2 (two) times daily. 08/09/16  Yes Dennie Bible, NP  lamoTRIgine (LAMICTAL) 200 MG tablet Take 1 tablet (200 mg total) by mouth 2 (two) times daily. 06/10/16  Yes Kathrynn Ducking, MD  lamoTRIgine (LAMICTAL) 25 MG tablet ONE TABLET TWICE A DAY FOR 2 WEEKS, THEN TAKE 2 TABLETS TWICE A DAY 10/11/16  Yes Kathrynn Ducking, MD  levETIRAcetam (KEPPRA) 1000 MG tablet TAKE 2 TABLETS (2,000 MG TOTAL) BY MOUTH 2 (TWO) TIMES DAILY. 08/23/16  Yes Kathrynn Ducking, MD  levothyroxine (  SYNTHROID, LEVOTHROID) 50 MCG tablet Take 50 mcg by mouth daily before breakfast.   Yes [provider]  promethazine (PHENERGAN) 25 MG tablet Take 25 mg by mouth every 6 (six) hours as needed. nausea   Yes [provider]  venlafaxine XR (EFFEXOR-XR) 75 MG 24 hr capsule Take 1 capsule (75 mg total) by mouth daily with breakfast. 08/31/16  Yes Arfeen, Arlyce Harman, MD  warfarin (COUMADIN) 5 MG tablet Take 5 mg by mouth 2 (two) times daily. Presently taking alternating dose 1 & 1/2 tab x 4 days & 1 tab x 3 days   Yes [provider]    ROS:  Out of a complete 14 system review of symptoms, the  patient complains only of the following symptoms, and all other reviewed systems are negative.  Blurred vision Cold intolerance Back pain, achy muscles Headache, seizures Confusion, depression  Blood pressure 140/86, pulse 82, height 5' 7.5" (1.715 m), SpO2 98 %.  Physical Exam  General: The patient is alert and cooperative at the time of the examination.  Skin: No significant peripheral edema is noted. The patient has a right above-knee amputation.   Neurologic Exam  Mental status: The patient is alert and oriented x 3 at the time of the examination. The patient has apparent normal recent and remote memory, with an apparently normal attention span and concentration ability.   Cranial nerves: Facial symmetry is present. Speech is normal, no aphasia or dysarthria is noted. Extraocular movements are restricted with superior and inferior gaze, the patient has divergence of gaze with primary gaze, exotropia on the left. Visual fields are full.  Motor: The patient has good strength in all 4 extremities.  Sensory examination: Soft touch sensation is symmetric on the face, arms, and legs.  Coordination: The patient has good finger-nose-finger and heel-to-shin bilaterally.  Gait and station: The patient has a normal gait. Tandem gait is normal. Romberg is negative. No drift is seen.  Reflexes: Deep tendon reflexes are symmetric.   Assessment/Plan:  1. Intractable seizures  2. Chronic nausea  The patient reports chronic nausea that seems to be visually induced. The patient is to use an occluder on one of her glasses lenses to see if this improves issue. She has significant divergence of gaze. The patient will go up on the Vimpat taking 100 mg twice daily. She will follow-up in 6 months. The vagal nerve stimulator was interrogated and appears to be working well, the patient is not able to feel the cycling as she has become acclimated to the stimulations.  Jill Alexanders  MD 11/03/2016 11:59 AM  [facility] [address] [place], [place] [address]  Phone [phone] Fax [phone]

## 2016-11-03 NOTE — Progress Notes (Signed)
Faxed printed/signed rx Vimpat to pt pharmacy. Fax: [phone]. Received confirmation.

## 2016-11-03 NOTE — Telephone Encounter (Signed)
Called patient to advise her that her follow up today needs to be with Dr [name] for her 6 month check of her VNS. She stated she has been having trouble with it. This RN asked if she can check in at 11:30 to see Dr [name] at 12 noon. Patient stated she can, verbalized appreciation for call and stated she is leaving town tomorrow. [name] RN for Dr [name] is aware and will change patient's appointment in [place].

## 2016-11-03 NOTE — Patient Instructions (Signed)
We will increase the Vimpat to 100 mg twice a day.

## 2016-11-10 DIAGNOSIS — Z86718 Personal history of other venous thrombosis and embolism: Secondary | ICD-10-CM | POA: Diagnosis not present

## 2016-11-26 NOTE — Telephone Encounter (Signed)
ERROR

## 2016-11-30 ENCOUNTER — Ambulatory Visit (HOSPITAL_COMMUNITY): Payer: Self-pay | Admitting: Psychology

## 2016-12-01 ENCOUNTER — Ambulatory Visit (INDEPENDENT_AMBULATORY_CARE_PROVIDER_SITE_OTHER): Payer: Medicare Other | Admitting: Psychiatry

## 2016-12-01 ENCOUNTER — Encounter (HOSPITAL_COMMUNITY): Payer: Self-pay | Admitting: Psychiatry

## 2016-12-01 DIAGNOSIS — R569 Unspecified convulsions: Secondary | ICD-10-CM

## 2016-12-01 DIAGNOSIS — F419 Anxiety disorder, unspecified: Secondary | ICD-10-CM | POA: Diagnosis not present

## 2016-12-01 DIAGNOSIS — R454 Irritability and anger: Secondary | ICD-10-CM

## 2016-12-01 DIAGNOSIS — Z818 Family history of other mental and behavioral disorders: Secondary | ICD-10-CM

## 2016-12-01 DIAGNOSIS — F321 Major depressive disorder, single episode, moderate: Secondary | ICD-10-CM

## 2016-12-01 DIAGNOSIS — R450 Nervousness: Secondary | ICD-10-CM

## 2016-12-01 DIAGNOSIS — R4583 Excessive crying of child, adolescent or adult: Secondary | ICD-10-CM | POA: Diagnosis not present

## 2016-12-01 MED ORDER — VENLAFAXINE HCL ER 150 MG PO CP24: 150.0000 mg | ORAL_CAPSULE | Freq: Every day | ORAL | 0 refills | Status: DC

## 2016-12-01 NOTE — Progress Notes (Signed)
[facility] MD/PA/NP OP Progress Note  12/01/2016 11:13 AM Cassandra Andrade  MRN:  [id number]  Chief Complaint: I still get depressed and frustrated.  I'm taking the medication.  HPI: Patient came for her follow-up appointment with her mother.  She is taking Effexor 75 mg in the morning.  She has noticed recently getting irritable, frustrated, depressed and having crying spells.  She used to enjoy drawing but she has not done recently.  Mother is concerned because she has anger time to time.  She is not using prosthesis because she lost weight and does not fit very well.  Her teeth were pulled and for quite a while she was not eating very well and lost weight.  Now she is able to get her new teeth and her appetite is improved but prosthesis needs to be replaced.  She is hoping that new prosthesis will arrived in few weeks.  Patient admitted that she feels burden to her mother who is taking responsibility to dating to the doctor's appointment and helping her ADLs.  Patient also endorsed lately having renovations at Department and she has difficulty ambulating.  She endorsed some time having crying spells but denies any paranoia, hallucination, feeling of hopelessness or worthlessness.  Recently she's seen her neurologist due to persistent seizures and medicines were adjusted.  She still get one to 2 seizures a week.  Her energy level is fair.  Her appetite is okay.  She denies drinking alcohol or using any illegal substances.  She denies any suicidal thoughts.  Visit Diagnosis:    ICD-10-CM   1. Major depressive disorder, single episode, moderate (HCC) F32.1 venlafaxine XR (EFFEXOR-XR) 150 MG 24 hr capsule    Past Psychiatric History: Reviewed. Patient has at least one psychiatric admission in 2011 due to overdose on her pain medication. At that time she has a marital conflict and having argument with her husband. She denies any history of mania psychosis hallucination. In the past she has given Zoloft and  Remeron however she do not remember the details. She remembered taking Lexapro and Abilify but it was stopped because lack of response. She took Prozac for a long time until recently it stopped working.  Past Medical History:  Past Medical History:  Diagnosis Date  . Anxiety   . Arthritis   . Cancer (HCC)    Osteosarcoma, right leg  . CVA (cerebrovascular accident due to intracerebral hemorrhage) ([place]) 2004  . Depression   . DVT (deep venous thrombosis) (HCC)    Left leg  . MRSA (methicillin resistant Staphylococcus aureus)   . 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 Seizures ([place])   . Thyroid disease   . 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 ABOVE KNEE LEG AMPUTATION Right    Osteosarcoma  . BRAIN SURGERY  2004   cerebral aneurysm  . FRACTURE SURGERY     Rt. femur, folllowed by MRSA  . TOTAL KNEE ARTHROPLASTY Right     Family Psychiatric History: Reviewed.  Family History:  Family History  Problem Relation Age of Onset  . Anxiety disorder Mother   . Diabetes Father     Social History:  Social History   Social History  . Marital status: Legally Separated    Spouse name: N/A  . Number of children: 3  . Years of education: GED   Social History Main Topics  . Smoking status: Never Smoker  .  Smokeless tobacco: Never Used     Comment: never used tobacco  . Alcohol use No  . Drug use: No  . Sexual activity: Not Currently   Other Topics Concern  . Not on file   Social History Narrative   Patient lives at home with mom.    Patient has GED.    Patient has 3 children.    Patient is married but in the process of divorced.           Allergies:  Allergies  Allergen Reactions  . Vicodin [Hydrocodone-Acetaminophen] Nausea And Vomiting  . Morphine Rash  . Morphine And Related Rash    Allergic reaction only in iv form    Metabolic Disorder Labs: Lab Results   Component Value Date   HGBA1C (L) 12/26/2009    4.3 (NOTE)                                                                       According to the ADA Clinical Practice Recommendations for 2011, when HbA1c is used as a screening test:   >=6.5%   Diagnostic of Diabetes Mellitus           (if abnormal result  is confirmed)  5.7-6.4%   Increased risk of developing Diabetes Mellitus  References:Diagnosis and Classification of Diabetes Mellitus,Diabetes FUXN,2355,73(UKGUR 1):S62-S69 and Standards of Medical Care in         Diabetes - 2011,Diabetes Care,2011,34  (Suppl 1):S11-S61.   MPG 77 12/26/2009   Lab Results  Component Value Date   PROLACTIN 16.8 02/17/2013   No results found for: CHOL, TRIG, HDL, CHOLHDL, VLDL, LDLCALC Lab Results  Component Value Date   TSH 2.417 02/17/2013   TSH 3.771 02/16/2010    Therapeutic Level Labs: No results found for: LITHIUM No results found for: VALPROATE No components found for:  CBMZ  Current Medications: Current Outpatient Prescriptions  Medication Sig Dispense Refill  . Ascorbic Acid (VITAMIN C) 1000 MG tablet Take 1,000 mg by mouth daily.     . Calcium Carbonate-Vitamin D 600-400 MG-UNIT per tablet Take 1 tablet by mouth daily.     [name] DIAZEPAM RE Place 20 mg rectally. Rectally as needed for seizure lasting longer than 2 minutes    . Lacosamide (VIMPAT) 100 MG TABS Take 1 tablet (100 mg total) by mouth 2 (two) times daily. 60 tablet 5  . lamoTRIgine (LAMICTAL) 200 MG tablet Take 1 tablet (200 mg total) by mouth 2 (two) times daily. 180 tablet 1  . lamoTRIgine (LAMICTAL) 25 MG tablet ONE TABLET TWICE A DAY FOR 2 WEEKS, THEN TAKE 2 TABLETS TWICE A DAY 360 tablet 1  . levETIRAcetam (KEPPRA) 1000 MG tablet TAKE 2 TABLETS (2,000 MG TOTAL) BY MOUTH 2 (TWO) TIMES DAILY. 360 tablet 3  . levothyroxine (SYNTHROID, LEVOTHROID) 50 MCG tablet Take 50 mcg by mouth daily before breakfast.    . promethazine (PHENERGAN) 25 MG tablet Take 25 mg by mouth every 6  (six) hours as needed. nausea    . venlafaxine XR (EFFEXOR-XR) 75 MG 24 hr capsule Take 1 capsule (75 mg total) by mouth daily with breakfast. 90 capsule 0  . warfarin (COUMADIN) 5 MG tablet Take 5 mg by mouth 2 (two) times daily. Presently  taking alternating dose 1 & 1/2 tab x 4 days & 1 tab x 3 days     No current facility-administered medications for this visit.      Musculoskeletal: Strength & Muscle Tone: decreased Gait & Station: Amputation right leg Patient leans: See above.  Psychiatric Specialty Exam: Review of Systems  Constitutional: Negative.   HENT: Negative.   Respiratory: Negative.   Genitourinary: Negative.   Musculoskeletal:       Right leg amputation  Skin: Negative.   Neurological: Positive for seizures.  Psychiatric/Behavioral: Positive for depression. The patient is nervous/anxious.     Blood pressure 128/74, pulse 81, height 5' 7.5" (1.715 m), weight 147 lb (66.7 kg).There is no height or weight on file to calculate BMI.  General Appearance: Fairly Groomed  Eye Contact:  Fair  Speech:  Clear and Coherent  Volume:  Normal  Mood:  Anxious and Dysphoric  Affect:  Constricted  Thought Process:  Goal Directed  Orientation:  Full (Time, Place, and Person)  Thought Content: Rumination   Suicidal Thoughts:  No  Homicidal Thoughts:  No  Memory:  Immediate;   Good Recent;   Good Remote;   Good  Judgement:  Good  Insight:  Good  Psychomotor Activity:  Decreased  Concentration:  Concentration: Fair and Attention Span: Fair  Recall:  Good  Fund of Knowledge: Good  Language: Good  Akathisia:  No  Handed:  Right  AIMS (if indicated): not done  Assets:  Communication Skills Desire for Improvement Housing Social Support  ADL's:  Intact  Cognition: WNL  Sleep:  Fair   Screenings: GAD-7     Counselor from 03/24/2015 in [facility]  Total GAD-7 Score  4    PHQ2-9     Counselor from 06/11/2015 in [facility] Counselor from 03/24/2015 in [facility] Office Visit from 05/16/2013 in [facility]  PHQ-2 Total Score  4  1  0  PHQ-9 Total Score  9  1  -       Assessment and Plan: Major depressive disorder, recurrent.  I review collateral information from other providers.  She recently seen neurologist and her medicines were adjusted.  She continues to have seizures one to 2 a week.  She is exhibiting depressive symptoms.  In the past she had tried Lexapro, Prozac and Abilify with limited response.  Recommended to increase Effexor 150 mg daily to help her depressive symptoms.  She is also anxious because her therapist is switching due to her insurance.  Reassurance given.  Patient is hoping to get new prosthesis which can affect so she can resume walking.  Recommended to call us back.  Patient and her mother has lot of questions which were answered and they were satisfied.  She is also taking Lamictal, Vimpat and Keppra from the neurologist.  Recommended to call us back if she feels worsening of the symptom.  Discuss safety concern that anytime having active suicidal thoughts or homicidal thoughts and she need to call 911 or go to the local emergency room.  Follow-up in 6 weeks.  Time spent 25 minutes.   Cranford Blessinger T., MD 12/01/2016, 11:13 AM

## 2016-12-03 ENCOUNTER — Other Ambulatory Visit: Payer: Self-pay | Admitting: Neurology

## 2016-12-08 ENCOUNTER — Ambulatory Visit: Payer: Medicare Other | Admitting: Nurse Practitioner

## 2016-12-08 DIAGNOSIS — Z86718 Personal history of other venous thrombosis and embolism: Secondary | ICD-10-CM | POA: Diagnosis not present

## 2016-12-23 ENCOUNTER — Encounter (HOSPITAL_COMMUNITY): Payer: Self-pay | Admitting: Licensed Clinical Social Worker

## 2016-12-23 ENCOUNTER — Ambulatory Visit (INDEPENDENT_AMBULATORY_CARE_PROVIDER_SITE_OTHER): Payer: Medicare Other | Admitting: Licensed Clinical Social Worker

## 2016-12-23 DIAGNOSIS — G40909 Epilepsy, unspecified, not intractable, without status epilepticus: Secondary | ICD-10-CM

## 2016-12-23 DIAGNOSIS — R4581 Low self-esteem: Secondary | ICD-10-CM | POA: Diagnosis not present

## 2016-12-23 DIAGNOSIS — F331 Major depressive disorder, recurrent, moderate: Secondary | ICD-10-CM
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
## 2016-12-23 NOTE — Progress Notes (Signed)
   THERAPIST PROGRESS NOTE  Session Time: 2:30-3:30pm  Participation Level: Active  Behavioral Response: CasualAlertEuthymic  Type of Therapy: Family Therapy  Treatment Goals addressed: Coping and Diagnosis: Major Depressive Disorder, recurrent, moderate  Interventions: CBT and Motivational Interviewing  Summary: Cassandra Andrade is a 51 y.o. female who presents with Major Depressive Disorder, recurrent episode, moderate.   Suicidal/Homicidal: Nowithout intent/plan  Therapist Response: Maycie and her mother engaged well in [place]. [name] reports depressed mood, difficulty with sleeping, some anxiety and worry, and low self esteem. She reports a history of multiple health conditions, including cancer, stroke, aneurysm, and multiple seizures per week. She also reports problems with her self esteem due to not being able to care for herself.    Plan: Return again in 4 weeks.  Diagnosis: Axis I: Major Depressive Disorder, recurrent episode, moderate      [name], LCSW 12/23/2016

## 2017-01-05 DIAGNOSIS — Z86718 Personal history of other venous thrombosis and embolism: Secondary | ICD-10-CM | POA: Diagnosis not present

## 2017-01-07 ENCOUNTER — Other Ambulatory Visit (HOSPITAL_COMMUNITY): Payer: Self-pay | Admitting: Psychiatry

## 2017-01-07 DIAGNOSIS — F321 Major depressive disorder, single episode, moderate: Secondary | ICD-10-CM

## 2017-01-11 ENCOUNTER — Other Ambulatory Visit: Payer: Self-pay | Admitting: Hematology

## 2017-01-11 DIAGNOSIS — Z86718 Personal history of other venous thrombosis and embolism: Secondary | ICD-10-CM

## 2017-01-11 NOTE — Progress Notes (Signed)
[place]  [phone] [phone]) [phone]  HEMATOLOGY CLINIC FOLLOW UP VISIT    ID: Cassandra Andrade OB: [birth year] MR#: [id number] RAQ#:[id number] Patient Care Team: Smothers, Andree Elk, NP as PCP - General (Nurse Practitioner) Kathlee Nations, MD as Consulting Physician (Psychiatry)  REASON FOR OFFICE VISIT: 1. Bilateral lower extremity deep venous thrombosis.  2. Right below-knee amputation for osteogenic sarcoma.  3. Multiple miscarriages.  4. Positive lupus anticoagulant, repeated negative   PATIENT IDENTIFICATION  Cassandra Andrade is a pleasant 51 years old African-American female who is coming to established here at [facility]. Previously she was being seen by Dr. Burney Gauze at [facility] location. 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 She had radiation and chemotherapy. Should the surgery with the bone prosthesis. 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 It also causes her some memory problems. She was in coma for a while and she has a tracheostomy done and was placed in a long-term care facility. She has history of multiple miscarriages. She does have 3 children. She also had a right AKA because of the right lower leg infection Iin2013. 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 She was initially evaluated by Dr. Burney Gauze on 12/01/2012. She is wheelchair bound. She also have history of situational depression, iron deficiency anemia and hypothyroidism. 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 He also takes antiseizure therapy is no known family history of thromboembolic problems. She had couple of venous duplex studies done last one was on 09/19/2013 showing similar appearing residual chronic thrombus within the common femoral and femoral veins bilaterally. No significant interval change from 04/04/2013  patient's mother have a machine at home where they check her INR and the target INR is between 2-3. Her current Coumadin dose 10 mg daily and the results are being called out to physician who regulates the Coumadin. Patient and her mother would like to continue the same arrangement although I offered her that we have a dedicated Coumadin clinic and we can help in Coumadin regulation if they desire. Because of a positive lupus anticoagulant, a prior history of osteosarcoma, generalized deconditioning and wheelchair bound status, and presence of chronic thrombus on vascular imaging, Dr. Martha Clan recommended lifelong Coumadin.   CURRENT TREATMENT: Coumadin 5mg  on MWF and 7.5mg  on the rest of week, to maintain INR between 2-3   INTERIM HISTORY:  Cassandra Andrade is here for a follow up. She was last seen by me a year ago. She presents to the clinic today accompanied by her mother.  She notes she has back pain that will cause headaches. She thinks it is the way she sleeps that will effect her back. She tkinks the headaches also come from agitation with others. She does take anti-depressant Effexor 150mg . She is tolerating Coumadin well. She denies bleeding but will occasionally get a bruise. She no longer has periods. She needs to get another prosthesis due to weight loss. She currently uses wheelchair. She loss weight after teeth extraction. She loss about 30 pounds. She currently is on a liquid diet. She now has partial dentures. She does not want to take oral iron and rather increase iron in diet.    REVIEW OF SYSTEMS: All other 10 point review of systems is negative.    PAST MEDICAL HISTORY: Past  Medical History:  Diagnosis Date  . Anxiety   . Arthritis   . Cancer (HCC)    Osteosarcoma, right leg  . CVA (cerebrovascular accident due to intracerebral hemorrhage) ([place]) 2004  . Depression   . DVT (deep venous thrombosis) (HCC)    Left leg  . MRSA (methicillin resistant Staphylococcus aureus)   . S/P  BKA (below knee amputation) unilateral (HCC)    Right   . Seizures ([place])   . Thyroid disease   . Unspecified epilepsy with intractable epilepsy 02/28/2013   PAST SURGICAL HISTORY: Past Surgical History:  Procedure Laterality Date  . ABOVE KNEE LEG AMPUTATION Right    Osteosarcoma  . BRAIN SURGERY  2004   cerebral aneurysm  . FRACTURE SURGERY     Rt. femur, folllowed by MRSA  . TOTAL KNEE ARTHROPLASTY Right    FAMILY HISTORY Family History  Problem Relation Age of Onset  . Anxiety disorder Mother   . Diabetes Father    GYNECOLOGIC HISTORY:  No LMP recorded. Patient has had an ablation.   SOCIAL HISTORY:  Social History   Socioeconomic History  . Marital status: Legally Separated    Spouse name: Not on file  . Number of children: 3  . Years of education: GED  . Highest education level: Not on file  Social Needs  . Financial resource strain: Not on file  . Food insecurity - worry: Not on file  . Food insecurity - inability: Not on file  . Transportation needs - medical: Not on file  . Transportation needs - non-medical: Not on file  Occupational History  . Not on file  Tobacco Use  . Smoking status: Never Smoker  . Smokeless tobacco: Never Used  . Tobacco comment: never used tobacco  Substance and Sexual Activity  . Alcohol use: No    Alcohol/week: 0.0 oz  . Drug use: No  . Sexual activity: Not Currently  Other Topics Concern  . Not on file  Social History Narrative   Patient lives at home with mom.    Patient has GED.    Patient has 3 children.    Patient is married but in the process of divorced.       ADVANCED DIRECTIVES: <no information>  HEALTH MAINTENANCE: Social History   Tobacco Use  . Smoking status: Never Smoker  . Smokeless tobacco: Never Used  . Tobacco comment: never used tobacco  Substance Use Topics  . Alcohol use: No    Alcohol/week: 0.0 oz  . Drug use: No   Colonoscopy: PAP: Bone density: Lipid panel:  Allergies  Allergen  Reactions  . Vicodin [Hydrocodone-Acetaminophen] Nausea And Vomiting  . Morphine Rash  . Morphine And Related Rash    Allergic reaction only in iv form    Current Outpatient Medications  Medication Sig Dispense Refill  . acetaminophen (TYLENOL) 500 MG tablet Take 1,000 mg by mouth every 8 (eight) hours as needed.    . Ascorbic Acid (VITAMIN C) 1000 MG tablet Take 1,000 mg by mouth daily.     . Calcium Carbonate-Vitamin D 600-400 MG-UNIT per tablet Take 1 tablet by mouth daily.     [name] DIAZEPAM RE Place 20 mg rectally. Rectally as needed for seizure lasting longer than 2 minutes    . Lacosamide (VIMPAT) 100 MG TABS Take 1 tablet (100 mg total) by mouth 2 (two) times daily. 60 tablet 5  . lamoTRIgine (LAMICTAL) 200 MG tablet TAKE 1 TABLET (200 MG TOTAL) BY MOUTH 2 (TWO) TIMES  DAILY. 180 tablet 1  . lamoTRIgine (LAMICTAL) 25 MG tablet ONE TABLET TWICE A DAY FOR 2 WEEKS, THEN TAKE 2 TABLETS TWICE A DAY 360 tablet 1  . levETIRAcetam (KEPPRA) 1000 MG tablet TAKE 2 TABLETS (2,000 MG TOTAL) BY MOUTH 2 (TWO) TIMES DAILY. 360 tablet 3  . levothyroxine (SYNTHROID, LEVOTHROID) 50 MCG tablet Take 50 mcg by mouth daily before breakfast.    . promethazine (PHENERGAN) 25 MG tablet Take 25 mg by mouth every 6 (six) hours as needed. nausea    . venlafaxine XR (EFFEXOR-XR) 150 MG 24 hr capsule Take 1 capsule (150 mg total) by mouth daily with breakfast. 90 capsule 0  . warfarin (COUMADIN) 5 MG tablet Take 5 mg by mouth 2 (two) times daily. Presently taking alternating dose 1 & 1/2 tab x 4 days & 1 tab x 3 days     No current facility-administered medications for this visit.    OBJECTIVE: Vitals:   01/12/17 1202  BP: 104/71  Pulse: 82  Resp: 18  Temp: 98 F (36.7 C)  SpO2: 100%   Body mass index is 22.68 kg/m.  ECOG FS: 3 Ocular: Sclerae unicteric, pupils equal, round and reactive to light Ear-nose-throat: Oropharynx clear, dentition fair Lymphatic: No cervical or supraclavicular adenopathy Lungs no  rales or rhonchi, good excursion bilaterally Heart regular rate and rhythm, no murmur appreciated Abd soft, nontender, positive bowel sounds MSK no focal spinal tenderness, no joint edema. (+) right AKA Neuro: non-focal, well-oriented, appropriate affect Breasts: Deferred  LAB RESULTS: CBC Latest Ref Rng & Units 01/12/2017 01/14/2016 12/04/2015  WBC 3.9 - 10.3 10e3/uL 4.0 7.0 4.3  Hemoglobin 11.6 - 15.9 g/dL 11.5(L) 11.2(L) 11.4  Hematocrit 34.8 - 46.6 % 36.9 35.3 35.8  Platelets 145 - 400 10e3/uL 197 274 201    CMP Latest Ref Rng & Units 01/12/2017 01/14/2016 12/04/2015  Glucose 70 - 140 mg/dl 79 93 151(H)  BUN 7.0 - 26.0 mg/dL 11.2 10.2 15  Creatinine 0.6 - 1.1 mg/dL 0.9 1.0 1.06(H)  Sodium 136 - 145 mEq/L 141 139 143  Potassium 3.5 - 5.1 mEq/L 4.5 4.9 4.1  Chloride 96 - 106 mmol/L - - 102  CO2 22 - 29 mEq/L 30(H) 30(H) 26  Calcium 8.4 - 10.4 mg/dL 10.1 9.9 8.9  Total Protein 6.4 - 8.3 g/dL 7.8 8.2 6.7  Total Bilirubin 0.20 - 1.20 mg/dL 0.23 <0.22 <0.2  Alkaline Phos 40 - 150 U/L 105 122 119(H)  AST 5 - 34 U/L 34 32 27  ALT 0 - 55 U/L 65(H) 42 27    STUDIES: US Venous Img Lower Bilateral  09/19/2013   CLINICAL DATA:  Chronic bilateral DVT  EXAM: BILATERAL LOWER EXTREMITY VENOUS DOPPLER ULTRASOUND  TECHNIQUE: Gray-scale sonography with graded compression, as well as color Doppler and duplex ultrasound were performed to evaluate the lower extremity deep venous systems from the level of the common femoral vein and including the common femoral, femoral, profunda femoral, popliteal and calf veins including the posterior tibial, peroneal and gastrocnemius veins when visible. The superficial great saphenous vein was also interrogated. Spectral Doppler was utilized to evaluate flow at rest and with distal augmentation maneuvers in the common femoral, femoral and popliteal veins.  COMPARISON:  None.  FINDINGS: Deep veins: Bilateral lower extremity deep veins demonstrate similar recanalized  thrombus with mural thrombus seen throughout both common femoral veins and the femoral veins of the thigh. No significant popliteal or tibial thrombus is identified on the left. The patient is status  post amputation of the right lower leg.  Compressibility of deep veins: Partially compressible.  Duplex waveform respiratory phasicity:  Diminished but Normal.  Augmentation not performed.  Venous reflux: None visualized.  Other findings: No evidence of superficial thrombophlebitis or abnormal fluid collection.  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 No significant interval change.   Electronically Signed   By: Daryll Brod M.D.   On: 09/19/2013 12:26    ASSESSMENT/PLAN: Christyl Osentoski is a 51 y.o. African American female.  1. History of bilateral DVT, transient Lupus anticoagulant positive, repeat tests were negative. -Her repeated Doppler showed chronic-appearing DVT in July 2015, which has not changed much. -She is wheelchair bound due to her right lower extremity amputation above knee. She has certainly high risks for thrombosis due to her immobilization.  -I think she likely need long-term anticoagulation, she agrees with the plan. She asked about other anticoagulation options such a Xeralto, and the pros and cons of Coumadin and Xeralto, especially their reversibility. After the extensive discussion, she decided to stay on Coumadin.  -She is following up with her primary care physician for Coumadin clinic. The patient and her mother still prefer to follow up with me for long-term anticoagulation management.  -I encouraged her to be more physically active as she tolerates, she is doing better overall, less dependent on her mother.  -Labs reviewed, Stable Anemia with hg 11.5. I suggest she can take prenatal multivitamin.  -She is clinically doing well, we'll continue Coumadin, OK to hold Coumadin before procedure or surgery. -Pt  would like to continue following me for her Coumadin.  -I discussed if she uses prosthesis and becomes more physically active we can consider switching her to baby aspirin -I encouraged her to contact [facility] about her last mammogram. I will schedule her for a mammogram in 1-2 months  -I offered flu shot today, She opted for it.   2. Right lower extremity osteosarcoma, status post amputation above knee. -Doing well, no evidence of recurrence. She uses prosthesis.  3. Mild anemia -Her hemoglobin was 11.4 in 10/2014, Iron study reviewed and normal ferritin and serum iron level. Her transferrin saturation is slightly low. -Anemia stable, Hb 11.5 on 01/12/17 -I suggest she can take prenatal multivitamin.  4. Insomnia -I suggest melatonin or benadryl.  -I reviewed sleep hygiene with her.     Follow-up:  -Flu shot today  -she will continue Coumadin, monitoring and dose adjustment by her PCP -Mammogram at [facility] in 1-2 months  -Lab and f/u in one year    All questions were answered and she is in agreement with the plan. She knows to call here with any questions or concerns and to go to the ED in the event of an emergency. We can certainly see her sooner if need be.   Truitt Merle  01/12/2017   This document serves as a record of services personally performed by Truitt Merle, MD. It was created on her behalf by Joslyn Devon, a trained medical scribe. The creation of this record is based on the scribe's personal observations and the provider's statements to them.    I have reviewed the above documentation for accuracy and completeness, and I agree with the above.

## 2017-01-12 ENCOUNTER — Telehealth: Payer: Self-pay

## 2017-01-12 ENCOUNTER — Ambulatory Visit (HOSPITAL_COMMUNITY): Payer: Self-pay | Admitting: Psychiatry

## 2017-01-12 ENCOUNTER — Ambulatory Visit (HOSPITAL_BASED_OUTPATIENT_CLINIC_OR_DEPARTMENT_OTHER): Payer: Medicare Other | Admitting: Hematology

## 2017-01-12 ENCOUNTER — Encounter: Payer: Self-pay | Admitting: Hematology

## 2017-01-12 ENCOUNTER — Other Ambulatory Visit (HOSPITAL_BASED_OUTPATIENT_CLINIC_OR_DEPARTMENT_OTHER): Payer: Medicare Other

## 2017-01-12 VITALS — BP 104/71 | HR 82 | Temp 98.0°F | Resp 18 | Ht 67.5 in

## 2017-01-12 DIAGNOSIS — D649 Anemia, unspecified: Secondary | ICD-10-CM | POA: Diagnosis not present

## 2017-01-12 DIAGNOSIS — Z86718 Personal history of other venous thrombosis and embolism: Secondary | ICD-10-CM

## 2017-01-12 DIAGNOSIS — G4700 Insomnia, unspecified: Secondary | ICD-10-CM | POA: Diagnosis not present

## 2017-01-12 DIAGNOSIS — Z85830 Personal history of malignant neoplasm of bone: Secondary | ICD-10-CM | POA: Diagnosis not present

## 2017-01-12 DIAGNOSIS — I82523 Chronic embolism and thrombosis of iliac vein, bilateral: Secondary | ICD-10-CM | POA: Diagnosis not present

## 2017-01-12 DIAGNOSIS — Z7901 Long term (current) use of anticoagulants: Secondary | ICD-10-CM

## 2017-01-12 DIAGNOSIS — M549 Dorsalgia, unspecified: Secondary | ICD-10-CM

## 2017-01-12 DIAGNOSIS — Z1231 Encounter for screening mammogram for malignant neoplasm of breast: Secondary | ICD-10-CM

## 2017-01-12 DIAGNOSIS — I82419 Acute embolism and thrombosis of unspecified femoral vein: Secondary | ICD-10-CM

## 2017-01-12 DIAGNOSIS — Z23 Encounter for immunization: Secondary | ICD-10-CM | POA: Diagnosis not present

## 2017-01-12 DIAGNOSIS — R51 Headache: Secondary | ICD-10-CM | POA: Diagnosis not present

## 2017-01-12 LAB — COMPREHENSIVE METABOLIC PANEL
ALT: 65 U/L — AB (ref 0–55)
ANION GAP: 8 meq/L (ref 3–11)
AST: 34 U/L (ref 5–34)
Albumin: 4 g/dL (ref 3.5–5.0)
Alkaline Phosphatase: 105 U/L (ref 40–150)
BUN: 11.2 mg/dL (ref 7.0–26.0)
CHLORIDE: 103 meq/L (ref 98–109)
CO2: 30 meq/L — AB (ref 22–29)
CREATININE: 0.9 mg/dL (ref 0.6–1.1)
Calcium: 10.1 mg/dL (ref 8.4–10.4)
EGFR: >60 mL/min/{1.73_m2} (ref >60)
Glucose: 79 mg/dl (ref 70–140)
POTASSIUM: 4.5 meq/L (ref 3.5–5.1)
Sodium: 141 mEq/L (ref 136–145)
Total Bilirubin: 0.23 mg/dL (ref 0.20–1.20)
Total Protein: 7.8 g/dL (ref 6.4–8.3)

## 2017-01-12 LAB — CBC WITH DIFFERENTIAL/PLATELET
BASO%: 0.5 % (ref 0.0–2.0)
BASOS ABS: 0 10*3/uL (ref 0.0–0.1)
EOS ABS: 0 10*3/uL (ref 0.0–0.5)
EOS%: 1 % (ref 0.0–7.0)
HCT: 36.9 % (ref 34.8–46.6)
HEMOGLOBIN: 11.5 g/dL — AB (ref 11.6–15.9)
LYMPH%: 54 % — ABNORMAL HIGH (ref 14.0–49.7)
MCH: 28.1 pg (ref 25.1–34.0)
MCHC: 31.2 g/dL — ABNORMAL LOW (ref 31.5–36.0)
MCV: 90.2 fL (ref 79.5–101.0)
MONO#: 0.4 10*3/uL (ref 0.1–0.9)
MONO%: 8.8 % (ref 0.0–14.0)
NEUT%: 35.7 % — ABNORMAL LOW (ref 38.4–76.8)
NEUTROS ABS: 1.4 10*3/uL — AB (ref 1.5–6.5)
PLATELETS: 197 10*3/uL (ref 145–400)
RBC: 4.09 10*6/uL (ref 3.70–5.45)
RDW: 14.1 % (ref 11.2–14.5)
WBC: 4 10*3/uL (ref 3.9–10.3)
lymph#: 2.1 10*3/uL (ref 0.9–3.3)

## 2017-01-12 MED ORDER — INFLUENZA VAC SPLIT QUAD 0.5 ML IM SUSY
0.5000 mL | PREFILLED_SYRINGE | Freq: Once | INTRAMUSCULAR | Status: AC
  Administered 2017-01-12: 0.5 mL via INTRAMUSCULAR
  Filled 2017-01-12: qty 0.5

## 2017-01-12 NOTE — Telephone Encounter (Signed)
Printed avs and calender or upcoming appointment. Per 11/21 los

## 2017-01-17 ENCOUNTER — Ambulatory Visit (INDEPENDENT_AMBULATORY_CARE_PROVIDER_SITE_OTHER): Payer: Medicare Other | Admitting: Psychiatry

## 2017-01-17 ENCOUNTER — Encounter (HOSPITAL_COMMUNITY): Payer: Self-pay | Admitting: Psychiatry

## 2017-01-17 DIAGNOSIS — Z993 Dependence on wheelchair: Secondary | ICD-10-CM

## 2017-01-17 DIAGNOSIS — Z79899 Other long term (current) drug therapy: Secondary | ICD-10-CM | POA: Diagnosis not present

## 2017-01-17 DIAGNOSIS — F321 Major depressive disorder, single episode, moderate: Secondary | ICD-10-CM | POA: Diagnosis not present

## 2017-01-17 DIAGNOSIS — G4700 Insomnia, unspecified: Secondary | ICD-10-CM | POA: Diagnosis not present

## 2017-01-17 DIAGNOSIS — G40909 Epilepsy, unspecified, not intractable, without status epilepticus: Secondary | ICD-10-CM

## 2017-01-17 DIAGNOSIS — Z89611 Acquired absence of right leg above knee: Secondary | ICD-10-CM | POA: Diagnosis not present

## 2017-01-17 MED ORDER — VENLAFAXINE HCL ER 150 MG PO CP24: 150.0000 mg | ORAL_CAPSULE | Freq: Every day | ORAL | 0 refills | Status: DC

## 2017-01-17 NOTE — Progress Notes (Signed)
[facility] MD/PA/NP OP Progress Note  01/17/2017 10:54 AM Cassandra Andrade  MRN:  [id number]  Chief Complaint: I like to increase Effexor.  I am feeling good.  HPI: Patient came for her follow-up appointment with her mother.  On her last visit we increase Effexor to 150 and she is seen much improvement.  She is more social, active and her affect is improved from the past.  Sometimes she has difficulty sleeping but denies any racing thoughts, irritability, crying spells or any feeling of hopelessness or worthlessness.  She is very happy because Thanksgiving her children came from [place] to visit her.  She has 3 son who are 55, 16 and 76 years old.  Patient denies any side effects from the medication.  She continues to have seizures on and off and she is taking multiple medication for that.  She recently seen neurologist and there were no changes.  Patient denies any suicidal thoughts or homicidal thoughts.  She like to continue Effexor.  Patient denies drinking alcohol or using any illegal substances.  Her appetite is okay.  She is not using prosthesis all the time but at home she tried to use and she feels proud that she is making progress.  She started seeing therapy with Janett Billow and that she is also going very well.  Visit Diagnosis:    ICD-10-CM   1. Major depressive disorder, single episode, moderate (HCC) F32.1 venlafaxine XR (EFFEXOR-XR) 150 MG 24 hr capsule    Past Psychiatric History: Viewed. Patient has at least one psychiatric admission in 2011 due to overdose on her pain medication. At that time she has a marital conflict and having argument with her husband. She denies any history of mania psychosis hallucination. In the past she has given Zoloft and Remeron however she do not remember the details. She remembered taking Lexapro and Abilify but it was stopped because lack of response. She took Prozac for a long time until recently it stopped working  Past Medical History:  Past Medical  History:  Diagnosis Date  . Anxiety   . Arthritis   . Cancer (HCC)    Osteosarcoma, right leg  . CVA (cerebrovascular accident due to intracerebral hemorrhage) ([place]) 2004  . Depression   . DVT (deep venous thrombosis) (HCC)    Left leg  . MRSA (methicillin resistant Staphylococcus aureus)   . 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 Seizures ([place])   . Thyroid disease   . 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 ABOVE KNEE LEG AMPUTATION Right    Osteosarcoma  . BRAIN SURGERY  2004   cerebral aneurysm  . FRACTURE SURGERY     Rt. femur, folllowed by MRSA  . TOTAL KNEE ARTHROPLASTY Right     Family Psychiatric History: Reviewed.  Family History:  Family History  Problem Relation Age of Onset  . Anxiety disorder Mother   . Diabetes Father     Social History:  Social History   Socioeconomic History  . Marital status: Legally Separated    Spouse name: Not on file  . Number of children: 3  . Years of education: GED  . Highest education level: Not on file  Social Needs  . Financial resource strain: Not on file  . Food insecurity - worry: Not on file  . Food insecurity - inability: Not on file  . Transportation needs - medical: Not on file  .  Transportation needs - non-medical: Not on file  Occupational History  . Not on file  Tobacco Use  . Smoking status: Never Smoker  . Smokeless tobacco: Never Used  . Tobacco comment: never used tobacco  Substance and Sexual Activity  . Alcohol use: No    Alcohol/week: 0.0 oz  . Drug use: No  . Sexual activity: Not Currently  Other Topics Concern  . Not on file  Social History Narrative   Patient lives at home with mom.    Patient has GED.    Patient has 3 children.    Patient is married but in the process of divorced.        Allergies:  Allergies  Allergen Reactions  . Vicodin [Hydrocodone-Acetaminophen] Nausea And  Vomiting  . Morphine Rash  . Morphine And Related Rash    Allergic reaction only in iv form    Metabolic Disorder Labs: Lab Results  Component Value Date   HGBA1C (L) 12/26/2009    4.3 (NOTE)                                                                       According to the ADA Clinical Practice Recommendations for 2011, when HbA1c is used as a screening test:   >=6.5%   Diagnostic of Diabetes Mellitus           (if abnormal result  is confirmed)  5.7-6.4%   Increased risk of developing Diabetes Mellitus  References:Diagnosis and Classification of Diabetes Mellitus,Diabetes FGHW,2993,71(IRCVE 1):S62-S69 and Standards of Medical Care in         Diabetes - 2011,Diabetes Care,2011,34  (Suppl 1):S11-S61.   MPG 77 12/26/2009   Lab Results  Component Value Date   PROLACTIN 16.8 02/17/2013   No results found for: CHOL, TRIG, HDL, CHOLHDL, VLDL, LDLCALC Lab Results  Component Value Date   TSH 2.417 02/17/2013   TSH 3.771 02/16/2010    Therapeutic Level Labs: No results found for: LITHIUM No results found for: VALPROATE No components found for:  CBMZ  Current Medications: Current Outpatient Medications  Medication Sig Dispense Refill  . acetaminophen (TYLENOL) 500 MG tablet Take 1,000 mg by mouth every 8 (eight) hours as needed.    . Ascorbic Acid (VITAMIN C) 1000 MG tablet Take 1,000 mg by mouth daily.     . Calcium Carbonate-Vitamin D 600-400 MG-UNIT per tablet Take 1 tablet by mouth daily.     [name] DIAZEPAM RE Place 20 mg rectally. Rectally as needed for seizure lasting longer than 2 minutes    . Lacosamide (VIMPAT) 100 MG TABS Take 1 tablet (100 mg total) by mouth 2 (two) times daily. 60 tablet 5  . lamoTRIgine (LAMICTAL) 200 MG tablet TAKE 1 TABLET (200 MG TOTAL) BY MOUTH 2 (TWO) TIMES DAILY. 180 tablet 1  . lamoTRIgine (LAMICTAL) 25 MG tablet ONE TABLET TWICE A DAY FOR 2 WEEKS, THEN TAKE 2 TABLETS TWICE A DAY 360 tablet 1  . levETIRAcetam (KEPPRA) 1000 MG tablet TAKE 2 TABLETS  (2,000 MG TOTAL) BY MOUTH 2 (TWO) TIMES DAILY. 360 tablet 3  . levothyroxine (SYNTHROID, LEVOTHROID) 50 MCG tablet Take 50 mcg by mouth daily before breakfast.    . promethazine (PHENERGAN) 25 MG tablet Take 25 mg by  mouth every 6 (six) hours as needed. nausea    . venlafaxine XR (EFFEXOR-XR) 150 MG 24 hr capsule Take 1 capsule (150 mg total) by mouth daily with breakfast. 90 capsule 0  . warfarin (COUMADIN) 5 MG tablet Take 5 mg by mouth 2 (two) times daily. Presently taking alternating dose 1 & 1/2 tab x 4 days & 1 tab x 3 days     No current facility-administered medications for this visit.      Musculoskeletal: Strength & Muscle Tone: decreased Gait & Station: unsteady, Uses wheelchair.  She had right leg amputation Patient leans: See above  Psychiatric Specialty Exam: Review of Systems  Musculoskeletal:       Right leg amputation  Neurological: Positive for seizures.    Blood pressure 136/72, pulse 82, height 5' 7.5" (1.715 m), weight 144 lb (65.3 kg).There is no height or weight on file to calculate BMI.  General Appearance: Casual  Eye Contact:  Good  Speech:  Clear and Coherent  Volume:  Normal  Mood:  Euthymic  Affect:  Appropriate  Thought Process:  Goal Directed  Orientation:  Full (Time, Place, and Person)  Thought Content: Logical   Suicidal Thoughts:  No  Homicidal Thoughts:  No  Memory:  Immediate;   Good Recent;   Good Remote;   Good  Judgement:  Good  Insight:  Good  Psychomotor Activity:  Normal  Concentration:  Concentration: Good and Attention Span: Good  Recall:  Good  Fund of Knowledge: Good  Language: Good  Akathisia:  No  Handed:  Right  AIMS (if indicated): not done  Assets:  Communication Skills Desire for Improvement Housing Social Support  ADL's:  Intact  Cognition: WNL  Sleep:  Fair   Screenings: GAD-7     Counselor from 03/24/2015 in [place]  Total GAD-7 Score  4    PHQ2-9     Counselor  from 06/11/2015 in [place] Counselor from 03/24/2015 in [place] Office Visit from 05/16/2013 in [place]  PHQ-2 Total Score  4  1  0  PHQ-9 Total Score  9  1  No data       Assessment and Plan: Major depressive disorder, recurrent.  Insomnia.  I review collect information from neurologist.  Patient is doing much better since we increased the Effexor.  She started counseling and that is helping her.  Recommended to try over-the-counter melatonin to help insomnia.  Discussed sleep hygiene.  Patient has no side effects from the Effexor.  She has no tremors or shakes.  Continue Effexor XR 150 mg daily.  Discussed medication side effects and benefits.  She is also taking Lamictal, Vimpat and Keppra from the neurologist.  Recommended to call us back if she has any question or any concern.  Encourage counseling regularly.  Follow-up in 3 months.   Kathlee Nations, MD 01/17/2017, 10:54 AM

## 2017-01-21 DIAGNOSIS — M545 Low back pain: Secondary | ICD-10-CM | POA: Diagnosis not present

## 2017-01-21 DIAGNOSIS — L219 Seborrheic dermatitis, unspecified: Secondary | ICD-10-CM | POA: Diagnosis not present

## 2017-01-21 DIAGNOSIS — Z7901 Long term (current) use of anticoagulants: Secondary | ICD-10-CM | POA: Diagnosis not present

## 2017-01-21 DIAGNOSIS — R110 Nausea: Secondary | ICD-10-CM | POA: Diagnosis not present

## 2017-01-21 DIAGNOSIS — I6990 Unspecified sequelae of unspecified cerebrovascular disease: Secondary | ICD-10-CM | POA: Diagnosis not present

## 2017-01-21 DIAGNOSIS — F331 Major depressive disorder, recurrent, moderate: Secondary | ICD-10-CM | POA: Diagnosis not present

## 2017-01-21 DIAGNOSIS — E039 Hypothyroidism, unspecified: Secondary | ICD-10-CM | POA: Diagnosis not present

## 2017-01-27 ENCOUNTER — Ambulatory Visit (INDEPENDENT_AMBULATORY_CARE_PROVIDER_SITE_OTHER): Payer: Medicare Other | Admitting: Licensed Clinical Social Worker

## 2017-01-27 ENCOUNTER — Encounter (HOSPITAL_COMMUNITY): Payer: Self-pay | Admitting: Licensed Clinical Social Worker

## 2017-01-27 DIAGNOSIS — F331 Major depressive disorder, recurrent, moderate: Secondary | ICD-10-CM | POA: Diagnosis not present

## 2017-01-27 NOTE — Progress Notes (Signed)
   THERAPIST PROGRESS NOTE  Session Time: 12:30pm-1:30pm  Participation Level: Active  Behavioral Response: CasualAlertEuthymic  Type of Therapy: Individual Therapy  Treatment Goals addressed: improve psychiatric symptoms, elevate mood, Improve unhelpful thought patterns, interpersonal relationship skills, learn about diagnosis, healthy coping skills    Interventions: CBT and Motivational Interviewing, Grounding and Mindfulness Techniques, psychoeducation   Summary: Cassandra Andrade is a 51 y.o. female who presents with Major Depressive Disorder, recurrent, moderate  Suicidal/Homicidal: No -without intent/plan  Therapist Response: Cassandra Andrade met with clinician for an individual session. Cassandra Andrade discussed her psychiatric symptoms, her current life events and her homework. She shared concerns about being a burden to her mother. Clinician discussed messages provided verbally and non-verbally about the stress and toll she is taking on her mother. Clinician also challenged belief that mother is doing this because she has to vs. Wants to. Clinician discussed the lengths she would go to for her own children, siblings, and mother. Clinician also discussed the importance of self-love and positive self talk.  Cassandra Andrade expressed concerns about her CNA, who has a negative attitude. Cassandra Andrade processed her thoughts and feelings. She also perfectly explained CBT as being able to choose her emotions, particularly choosing to be happy. Treatment plan updated.    Plan: Return again in 2-3 weeks.  Diagnosis: Axis I:   Major Depressive Disorder, recurrent, moderate  [name], LCSW 01/27/2017

## 2017-02-02 DIAGNOSIS — Z86718 Personal history of other venous thrombosis and embolism: Secondary | ICD-10-CM | POA: Diagnosis not present

## 2017-03-09 DIAGNOSIS — Z86718 Personal history of other venous thrombosis and embolism: Secondary | ICD-10-CM | POA: Diagnosis not present

## 2017-03-14 ENCOUNTER — Encounter (HOSPITAL_COMMUNITY): Payer: Self-pay | Admitting: Licensed Clinical Social Worker

## 2017-03-14 ENCOUNTER — Ambulatory Visit (INDEPENDENT_AMBULATORY_CARE_PROVIDER_SITE_OTHER): Payer: Medicare Other | Admitting: Licensed Clinical Social Worker

## 2017-03-14 DIAGNOSIS — F331 Major depressive disorder, recurrent, moderate: Secondary | ICD-10-CM | POA: Diagnosis not present

## 2017-03-14 NOTE — Progress Notes (Signed)
   THERAPIST PROGRESS NOTE  Session Time: 2:30pm-3:30pm  Participation Level: Active  Behavioral Response: NeatAlertAnxious and Irritable  Type of Therapy: Individual Therapy  Treatment Goals addressed: improve psychiatric symptoms, elevate mood, Improve unhelpful thought patterns, interpersonal relationship skills, learn about diagnosis, healthy coping skills    Interventions: CBT and Motivational Interviewing, Grounding and Mindfulness Techniques, psychoeducation   Summary: Cassandra Andrade is a 52 y.o. female who presents with Major Depressive Disorder, recurrent, moderate  Suicidal/Homicidal: No -without intent/plan  Therapist Response: Zhanae met with clinician for an individual session. [name] discussed her psychiatric symptoms, her current life events and her homework. She shared ongoing problems with her CNA, who comes across as very gruff and insensitive. [name] processed her thoughts and feelings about how the CNA treats her and "runs things". Clinician explored [name]'s ideal CNA, including personality, relationship, job tasks, Social research officer, government. Clinician encouraged [name] to plan a meeting with mom's support and address the issues with CNA. [name] identified worries that the CNA would be mad or upset. Clinician offered that this would be the only way to solve problems, instead of letting [name] stay unhappy with the service. [name] also discussed possibility of taking a class in Risk analyst to start making cartoons/comics. [name] identified memory problems, which may interfere, but she reported it could still be positive for her.    Plan: Return again in 2-3 weeks.  Diagnosis: Axis I:   Major Depressive Disorder, recurrent, moderate        [name], LCSW 03/14/2017

## 2017-03-23 DIAGNOSIS — H40013 Open angle with borderline findings, low risk, bilateral: Secondary | ICD-10-CM | POA: Diagnosis not present

## 2017-03-23 DIAGNOSIS — H40003 Preglaucoma, unspecified, bilateral: Secondary | ICD-10-CM | POA: Diagnosis not present

## 2017-03-23 DIAGNOSIS — H04123 Dry eye syndrome of bilateral lacrimal glands: Secondary | ICD-10-CM | POA: Diagnosis not present

## 2017-03-23 DIAGNOSIS — H5203 Hypermetropia, bilateral: Secondary | ICD-10-CM | POA: Diagnosis not present

## 2017-03-23 DIAGNOSIS — H11153 Pinguecula, bilateral: Secondary | ICD-10-CM | POA: Diagnosis not present

## 2017-03-23 DIAGNOSIS — H18413 Arcus senilis, bilateral: Secondary | ICD-10-CM | POA: Diagnosis not present

## 2017-03-23 DIAGNOSIS — H25013 Cortical age-related cataract, bilateral: Secondary | ICD-10-CM | POA: Diagnosis not present

## 2017-03-23 DIAGNOSIS — H11423 Conjunctival edema, bilateral: Secondary | ICD-10-CM | POA: Diagnosis not present

## 2017-03-23 DIAGNOSIS — H353131 Nonexudative age-related macular degeneration, bilateral, early dry stage: Secondary | ICD-10-CM | POA: Diagnosis not present

## 2017-03-23 DIAGNOSIS — H52223 Regular astigmatism, bilateral: Secondary | ICD-10-CM | POA: Diagnosis not present

## 2017-03-23 DIAGNOSIS — H2513 Age-related nuclear cataract, bilateral: Secondary | ICD-10-CM | POA: Diagnosis not present

## 2017-03-23 DIAGNOSIS — H4720 Unspecified optic atrophy: Secondary | ICD-10-CM | POA: Diagnosis not present

## 2017-04-07 DIAGNOSIS — Z86718 Personal history of other venous thrombosis and embolism: Secondary | ICD-10-CM | POA: Diagnosis not present

## 2017-04-19 ENCOUNTER — Ambulatory Visit (INDEPENDENT_AMBULATORY_CARE_PROVIDER_SITE_OTHER): Payer: Medicare Other | Admitting: Psychiatry

## 2017-04-19 ENCOUNTER — Encounter (HOSPITAL_COMMUNITY): Payer: Self-pay | Admitting: Psychiatry

## 2017-04-19 DIAGNOSIS — F321 Major depressive disorder, single episode, moderate: Secondary | ICD-10-CM | POA: Diagnosis not present

## 2017-04-19 DIAGNOSIS — Z818 Family history of other mental and behavioral disorders: Secondary | ICD-10-CM

## 2017-04-19 DIAGNOSIS — Z915 Personal history of self-harm: Secondary | ICD-10-CM | POA: Diagnosis not present

## 2017-04-19 MED ORDER — VENLAFAXINE HCL ER 150 MG PO CP24
150.0000 mg | ORAL_CAPSULE | Freq: Every day | ORAL | 0 refills | Status: DC
Start: 2017-04-19 — End: 2017-08-21

## 2017-04-19 NOTE — Progress Notes (Signed)
BH MD/PA/NP OP Progress Note  04/19/2017 10:50 AM Cassandra Andrade  MRN:  [id number]  Chief Complaint: I am doing good.  I am taking her medication.  HPI: Patient came for her follow-up appointment.  She is compliant with Effexor 150 mg daily.  She denies any irritability, anger, mania, psychosis or any hallucination.  She had a good Christmas because she was able to see her 3 son who lives in [place].  Patient has not seen neurologist but taking her seizure medicine regularly.  She continues to have seizures on and off and her last seizure was a few days ago.  Overall she describes her mood is good.  She started walking with a prosthesis and she feels proud about it.  Her appetite is okay.  She is seeing Janett Billow for CBT and she is happy that therapy going very well.  Patient denies any drinking or using any illegal substances.  Her energy level is good.  Visit Diagnosis:    ICD-10-CM   1. Major depressive disorder, single episode, moderate (HCC) F32.1 venlafaxine XR (EFFEXOR-XR) 150 MG 24 hr capsule    Past Psychiatric History: Viewed. Patient has at least one psychiatric admission in 2011 due to overdose on her pain medication. At that time she has a marital conflict and having argument with her husband. She denies any history of mania psychosis hallucination. In the past she has given Zoloft and Remeron however she do not remember the details. She remembered taking Lexapro and Abilify but it was stopped because lack of response. She took Prozac for a long time until recently it stopped working  Past Medical History:  Past Medical History:  Diagnosis Date  . Anxiety   . Arthritis   . Cancer (HCC)    Osteosarcoma, right leg  . CVA (cerebrovascular accident due to intracerebral hemorrhage) ([place]) 2004  . Depression   . DVT (deep venous thrombosis) (HCC)    Left leg  . MRSA (methicillin resistant Staphylococcus aureus)   . 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  Seizures ([place])   . Thyroid disease   . 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 ABOVE KNEE LEG AMPUTATION Right    Osteosarcoma  . BRAIN SURGERY  2004   cerebral aneurysm  . FRACTURE SURGERY     Rt. femur, folllowed by MRSA  . TOTAL KNEE ARTHROPLASTY Right     Family Psychiatric History: Viewed.  Family History:  Family History  Problem Relation Age of Onset  . Anxiety disorder Mother   . Diabetes Father     Social History:  Social History   Socioeconomic History  . Marital status: Legally Separated    Spouse name: Not on file  . Number of children: 3  . Years of education: GED  . Highest education level: Not on file  Social Needs  . Financial resource strain: Not on file  . Food insecurity - worry: Not on file  . Food insecurity - inability: Not on file  . Transportation needs - medical: Not on file  . Transportation needs - non-medical: Not on file  Occupational History  . Not on file  Tobacco Use  . Smoking status: Never Smoker  . Smokeless tobacco: Never Used  . Tobacco comment: never used tobacco  Substance and Sexual Activity  . Alcohol use: No    Alcohol/week: 0.0 oz  . Drug use: No  .  Sexual activity: Not Currently  Other Topics Concern  . Not on file  Social History Narrative   Patient lives at home with mom.    Patient has GED.    Patient has 3 children.    Patient is married but in the process of divorced.        Allergies:  Allergies  Allergen Reactions  . Vicodin [Hydrocodone-Acetaminophen] Nausea And Vomiting  . Morphine Rash  . Morphine And Related Rash    Allergic reaction only in iv form    Metabolic Disorder Labs: Lab Results  Component Value Date   HGBA1C (L) 12/26/2009    4.3 (NOTE)                                                                       According to the ADA Clinical Practice Recommendations for 2011, when HbA1c is used as a screening test:    >=6.5%   Diagnostic of Diabetes Mellitus           (if abnormal result  is confirmed)  5.7-6.4%   Increased risk of developing Diabetes Mellitus  References:Diagnosis and Classification of Diabetes Mellitus,Diabetes YBOF,7510,25(ENIDP 1):S62-S69 and Standards of Medical Care in         Diabetes - 2011,Diabetes Care,2011,34  (Suppl 1):S11-S61.   MPG 77 12/26/2009   Lab Results  Component Value Date   PROLACTIN 16.8 02/17/2013   No results found for: CHOL, TRIG, HDL, CHOLHDL, VLDL, LDLCALC Lab Results  Component Value Date   TSH 2.417 02/17/2013   TSH 3.771 02/16/2010    Therapeutic Level Labs: No results found for: LITHIUM No results found for: VALPROATE No components found for:  CBMZ  Current Medications: Current Outpatient Medications  Medication Sig Dispense Refill  . acetaminophen (TYLENOL) 500 MG tablet Take 1,000 mg by mouth every 8 (eight) hours as needed.    . Ascorbic Acid (VITAMIN C) 1000 MG tablet Take 1,000 mg by mouth daily.     . Calcium Carbonate-Vitamin D 600-400 MG-UNIT per tablet Take 1 tablet by mouth daily.     [name] DIAZEPAM RE Place 20 mg rectally. Rectally as needed for seizure lasting longer than 2 minutes    . Lacosamide (VIMPAT) 100 MG TABS Take 1 tablet (100 mg total) by mouth 2 (two) times daily. 60 tablet 5  . lamoTRIgine (LAMICTAL) 200 MG tablet TAKE 1 TABLET (200 MG TOTAL) BY MOUTH 2 (TWO) TIMES DAILY. 180 tablet 1  . lamoTRIgine (LAMICTAL) 25 MG tablet ONE TABLET TWICE A DAY FOR 2 WEEKS, THEN TAKE 2 TABLETS TWICE A DAY 360 tablet 1  . levETIRAcetam (KEPPRA) 1000 MG tablet TAKE 2 TABLETS (2,000 MG TOTAL) BY MOUTH 2 (TWO) TIMES DAILY. 360 tablet 3  . levothyroxine (SYNTHROID, LEVOTHROID) 50 MCG tablet Take 50 mcg by mouth daily before breakfast.    . promethazine (PHENERGAN) 25 MG tablet Take 25 mg by mouth every 6 (six) hours as needed. nausea    . venlafaxine XR (EFFEXOR-XR) 150 MG 24 hr capsule Take 1 capsule (150 mg total) by mouth daily with  breakfast. 90 capsule 0  . warfarin (COUMADIN) 5 MG tablet Take 5 mg by mouth 2 (two) times daily. Presently taking alternating dose 1 & 1/2 tab x 4  days & 1 tab x 3 days     No current facility-administered medications for this visit.      Musculoskeletal: Strength & Muscle Tone: decreased Gait & Station: unsteady, Use walker Patient leans: N/A  Psychiatric Specialty Exam: ROS  Blood pressure 130/80, pulse (!) 105, height 5' 7.5" (1.715 m), weight 152 lb (68.9 kg).There is no height or weight on file to calculate BMI.  General Appearance: Casual  Eye Contact:  Fair  Speech:  Slow  Volume:  Normal  Mood:  Anxious  Affect:  Appropriate  Thought Process:  Goal Directed  Orientation:  Full (Time, Place, and Person)  Thought Content: Logical   Suicidal Thoughts:  No  Homicidal Thoughts:  No  Memory:  Immediate;   Fair Recent;   Good Remote;   Good  Judgement:  Good  Insight:  Good  Psychomotor Activity:  Normal  Concentration:  Concentration: Fair and Attention Span: Fair  Recall:  Good  Fund of Knowledge: Good  Language: Good  Akathisia:  No  Handed:  Right  AIMS (if indicated): not done  Assets:  Communication Skills Desire for Improvement Housing Resilience Social Support  ADL's:  Intact  Cognition: WNL  Sleep:  Good   Screenings: GAD-7     Counselor from 03/24/2015 in [facility]  Total GAD-7 Score  4    PHQ2-9     Counselor from 06/11/2015 in [facility] Counselor from 03/24/2015 in [facility] [facility] Office Visit from 05/16/2013 in [place] [facility]  PHQ-2 Total Score  4  1  0  PHQ-9 Total Score  9  1  No data       Assessment and Plan: Major depressive disorder, recurrent.  Patient is a stable on her current psychiatric medication.  Sometimes she has insomnia and I recommended to try over-the-counter melatonin.  Continue Effexor XR  150 mg daily and counseling with Janett Billow for CBT.  Recommended to call us back if she has any question or any concern.  Follow-up in 3 months.   Kathlee Nations, MD 04/19/2017, 10:50 AM

## 2017-04-30 ENCOUNTER — Other Ambulatory Visit: Payer: Self-pay | Admitting: Neurology

## 2017-05-02 NOTE — Telephone Encounter (Signed)
Faxed printed/signed rx lacosamide to [facility] at [phone]. Received fax confirmation.

## 2017-05-03 ENCOUNTER — Ambulatory Visit (HOSPITAL_COMMUNITY): Payer: Self-pay | Admitting: Licensed Clinical Social Worker

## 2017-05-04 ENCOUNTER — Encounter: Payer: Self-pay | Admitting: Neurology

## 2017-05-04 ENCOUNTER — Ambulatory Visit (INDEPENDENT_AMBULATORY_CARE_PROVIDER_SITE_OTHER): Payer: Medicare Other | Admitting: Neurology

## 2017-05-04 VITALS — BP 108/71 | HR 93 | Ht 67.5 in | Wt 159.0 lb

## 2017-05-04 DIAGNOSIS — Z5181 Encounter for therapeutic drug level monitoring: Secondary | ICD-10-CM | POA: Diagnosis not present

## 2017-05-04 DIAGNOSIS — G40919 Epilepsy, unspecified, intractable, without status epilepticus: Secondary | ICD-10-CM | POA: Diagnosis not present

## 2017-05-04 DIAGNOSIS — G40119 Localization-related (focal) (partial) symptomatic epilepsy and epileptic syndromes with simple partial seizures, intractable, without status epilepticus: Secondary | ICD-10-CM | POA: Diagnosis not present

## 2017-05-04 DIAGNOSIS — Z86718 Personal history of other venous thrombosis and embolism: Secondary | ICD-10-CM | POA: Diagnosis not present

## 2017-05-04 MED ORDER — LACOSAMIDE 100 MG PO TABS: 1.0000 | ORAL_TABLET | Freq: Two times a day (BID) | ORAL | 3 refills | Status: DC

## 2017-05-04 MED ORDER — DIAZEPAM 20 MG RE GEL: 20.0000 mg | Freq: Every day | RECTAL | 3 refills | Status: DC | PRN

## 2017-05-04 MED ORDER — LAMOTRIGINE 200 MG PO TABS: 200.0000 mg | ORAL_TABLET | Freq: Two times a day (BID) | ORAL | 3 refills | Status: DC

## 2017-05-04 MED ORDER — LAMOTRIGINE 25 MG PO TABS: ORAL_TABLET | ORAL | 3 refills | Status: DC

## 2017-05-04 NOTE — Procedures (Signed)
     History:Cassandra Easonis a 52 year old patient with a history of intractable epilepsy. The patient has had a vagal nerve stimulator placed, she continues to have frequent seizures, she may have seizures at least every 2-3 days.  The patient is tolerating the vagal nerve stimulator well.  She comes in for a reevaluation.   VAGAL NERVE STIMULATOR SETTINGS:  Output current: 1.66milliamps changed to 1.75 milliamps  Signal frequency: 30Hz   Pulse width: 228microseconds  On time: 30seconds  Off time: 1.8 minutes  Magnet current: 1.22milliamps changed to 2.0 milliamps  Magnet on time: 60 seconds  Pulse width:250microseconds  Lead impedance:2560Ohms  IFI: No  The VNS unit was interrogated, and the settings were changed as above. The patient appeared to tolerate the changes well.  Jill Alexanders MD 10/12/20174:18 PM  [facility] [address] [place], [place] [address]  Phone [phone] Fax [phone]

## 2017-05-04 NOTE — Progress Notes (Signed)
Reason for visit: Intractable seizures  Aneesha Andrade is an 52 y.o. female  History of present illness:  Cassandra Andrade is a 52 year old right-handed black female with a history of intractable seizures.  The patient may go only a day or 2 without any seizures, she may have multiple seizures during a single day.  The patient is on several different medications including Keppra, Lamictal, and Vimpat.  She is tolerating the medications fairly well.  She has a vagal nerve stimulator in place, she tolerates this as well.  She has noted that caffeine may worsen her seizure events.  The patient does have some cognitive dysfunction associated with her epilepsy syndrome.  Her mother helps take care of her affairs.  The patient returns for an evaluation.  Past Medical History:  Diagnosis Date  . Anxiety   . Arthritis   . Cancer (HCC)    Osteosarcoma, right leg  . CVA (cerebrovascular accident due to intracerebral hemorrhage) ([place]) 2004  . Depression   . DVT (deep venous thrombosis) (HCC)    Left leg  . MRSA (methicillin resistant Staphylococcus aureus)   . 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 Seizures ([place])   . Thyroid disease   . 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 ABOVE KNEE LEG AMPUTATION Right    Osteosarcoma  . BRAIN SURGERY  2004   cerebral aneurysm  . FRACTURE SURGERY     Rt. femur, folllowed by MRSA  . TOTAL KNEE ARTHROPLASTY Right     Family History  Problem Relation Age of Onset  . Anxiety disorder Mother   . Diabetes Father     Social history:  reports that  has never smoked. she has never used smokeless tobacco. She reports that she does not drink alcohol or use drugs.    Allergies  Allergen Reactions  . Vicodin [Hydrocodone-Acetaminophen] Nausea And Vomiting  . Morphine Rash  . Morphine And Related Rash    Allergic reaction only in iv form    Medications:    Prior to Admission medications   Medication Sig Start Date End Date Taking? Authorizing Provider  acetaminophen (TYLENOL) 500 MG tablet Take 1,000 mg by mouth every 8 (eight) hours as needed.   Yes [provider]  Ascorbic Acid (VITAMIN C) 1000 MG tablet Take 1,000 mg by mouth daily.  08/17/11  Yes [provider]  Calcium Carbonate-Vitamin D 600-400 MG-UNIT per tablet Take 1 tablet by mouth daily.    Yes [provider]  ketoconazole (NIZORAL) 2 % shampoo APPLY 1 APPLICATION TOPICALLY TWICE A WEEK. 01/21/17  Yes [provider]  lamoTRIgine (LAMICTAL) 200 MG tablet TAKE 1 TABLET (200 MG TOTAL) BY MOUTH 2 (TWO) TIMES DAILY. 12/06/16  Yes Kathrynn Ducking, MD  lamoTRIgine (LAMICTAL) 25 MG tablet ONE TABLET TWICE A DAY FOR 2 WEEKS, THEN TAKE 2 TABLETS TWICE A DAY 10/11/16  Yes Kathrynn Ducking, MD  levETIRAcetam (KEPPRA) 1000 MG tablet TAKE 2 TABLETS (2,000 MG TOTAL) BY MOUTH 2 (TWO) TIMES DAILY. 08/23/16  Yes Kathrynn Ducking, MD  levothyroxine (SYNTHROID, LEVOTHROID) 50 MCG tablet Take 50 mcg by mouth daily before breakfast.   Yes [provider]  promethazine (PHENERGAN) 25 MG tablet Take 25 mg by mouth every 6 (six) hours as needed. nausea   Yes [provider]  ranitidine (ZANTAC) 150 MG tablet Take 150 mg by mouth daily.  03/25/17  Yes [provider]  venlafaxine XR (EFFEXOR-XR) 150 MG 24 hr capsule Take 1 capsule (150 mg total) by mouth daily with breakfast. 04/19/17  Yes Arfeen, Arlyce Harman, MD  VIMPAT 100 MG TABS TAKE 1 TABLET TWICE A DAY 05/02/17  Yes Kathrynn Ducking, MD  warfarin (COUMADIN) 5 MG tablet Take 5 mg by mouth 2 (two) times daily. Presently taking alternating dose 1 & 1/2 tab x 4 days & 1 tab x 3 days   Yes [provider]  DIAZEPAM RE Place 20 mg rectally. Rectally as needed for seizure lasting longer than 2 minutes    [provider]    ROS:  Out of a complete 14 system review of symptoms, the  patient complains only of the following symptoms, and all other reviewed systems are negative.  Seizures Memory loss  Blood pressure 108/71, pulse 93, height 5' 7.5" (1.715 m), weight 159 lb (72.1 kg).  Physical Exam  General: The patient is alert and cooperative at the time of the examination.  Skin: No significant peripheral edema is noted.  The patient has an above-knee amputation on the right with a prosthetic limb.   Neurologic Exam  Mental status: The patient is alert and oriented x 3 at the time of the examination. The patient has apparent normal recent and remote memory, with an apparently normal attention span and concentration ability.   Cranial nerves: Facial symmetry is present. Speech is normal, no aphasia or dysarthria is noted. Extraocular movements are full with the exception that there is restriction of vertical gaze. Visual fields are full.  Motor: The patient has good strength in all 4 extremities.  Sensory examination: Soft touch sensation is symmetric on the face, arms, and legs.  Coordination: The patient has good finger-nose-finger and heel-to-shin bilaterally.  Gait and station: The patient has a wide-based gait.  Patient uses a walker for ambulation.  Reflexes: Deep tendon reflexes are symmetric.   Assessment/Plan:  1.  Intractable epilepsy  The patient will continue her seizure medications.  The vagal nerve stimulator was interrogated today and readjusted.  The patient will follow-up in 6 months.  Will check blood work today.  Prescriptions were sent in for her medications.  Jill Alexanders MD 05/04/2017 4:11 PM  [facility] [address] [place], [place] [address]  Phone [phone] Fax [phone]

## 2017-05-04 NOTE — Progress Notes (Signed)
Faxed printed/signed rx Lacosamide to [facility] at [phone]. Received fax confirmation.

## 2017-05-07 LAB — COMPREHENSIVE METABOLIC PANEL
ALBUMIN: 4.3 g/dL (ref 3.5–5.5)
ALT: 54 IU/L — ABNORMAL HIGH (ref 0–32)
AST: 42 IU/L — AB (ref 0–40)
Albumin/Globulin Ratio: 1.5 (ref 1.2–2.2)
Alkaline Phosphatase: 122 IU/L — ABNORMAL HIGH (ref 39–117)
BUN / CREAT RATIO: 18 (ref 9–23)
BUN: 17 mg/dL (ref 6–24)
Bilirubin Total: <0.2 mg/dL (ref 0.0–1.2)
CALCIUM: 9.7 mg/dL (ref 8.7–10.2)
CO2: 27 mmol/L (ref 20–29)
CREATININE: 0.97 mg/dL (ref 0.57–1.00)
Chloride: 102 mmol/L (ref 96–106)
GFR calc Af Amer: 79 mL/min/{1.73_m2} (ref >59)
GFR, EST NON AFRICAN AMERICAN: 68 mL/min/{1.73_m2} (ref >59)
GLOBULIN, TOTAL: 2.8 g/dL (ref 1.5–4.5)
GLUCOSE: 91 mg/dL (ref 65–99)
Potassium: 4.2 mmol/L (ref 3.5–5.2)
Sodium: 145 mmol/L — ABNORMAL HIGH (ref 134–144)
Total Protein: 7.1 g/dL (ref 6.0–8.5)

## 2017-05-07 LAB — LEVETIRACETAM LEVEL: LEVETIRACETAM: 61.5 ug/mL — AB (ref 10.0–40.0)

## 2017-05-07 LAB — LAMOTRIGINE LEVEL: Lamotrigine Lvl: 11.4 ug/mL (ref 2.0–20.0)

## 2017-05-08 ENCOUNTER — Telehealth: Payer: Self-pay | Admitting: Neurology

## 2017-05-08 NOTE — Telephone Encounter (Signed)
I called the patient, talk with the mother.  Minimal elevations in the alkaline phosphatase, SGPT and SGOT are seen.  We will follow this, recheck in 6 weeks.

## 2017-05-16 ENCOUNTER — Ambulatory Visit (INDEPENDENT_AMBULATORY_CARE_PROVIDER_SITE_OTHER): Payer: Medicare Other | Admitting: Licensed Clinical Social Worker

## 2017-05-16 ENCOUNTER — Encounter (HOSPITAL_COMMUNITY): Payer: Self-pay | Admitting: Licensed Clinical Social Worker

## 2017-05-16 DIAGNOSIS — F331 Major depressive disorder, recurrent, moderate: Secondary | ICD-10-CM | POA: Diagnosis not present

## 2017-05-16 NOTE — Progress Notes (Signed)
   THERAPIST PROGRESS NOTE  Session Time: 3:30pm-4:30pm  Participation Level: Active  Behavioral Response: Well GroomedAlertAnxious  Type of Therapy: Individual Therapy  Treatment Goals addressed: improve psychiatric symptoms, elevate mood, Improve unhelpful thought patterns, interpersonal relationship skills, learn about diagnosis, healthy coping skills    Interventions: CBT and Motivational Interviewing, Grounding and Mindfulness Techniques, psychoeducation   Summary: Cassandra Andrade is a 52 y.o. female who presents with Major Depressive Disorder, recurrent, moderate  Suicidal/Homicidal: No -without intent/plan  Therapist Response: Cassandra Andrade met with Cassandra Andrade for an individual session. Cassandra Andrade discussed her psychiatric symptoms, her current life events and her homework. She shared ongoing strife with her CNA. Cassandra Andrade reports she continues to feel unhappy with the aid and reports she has not really said much to change anything because she feels guilty and does not want to get the woman fired. Cassandra Andrade reports she has not made any reports to the caseworker, who comes to the home regularly. However, she wants to get a new one. Cassandra Andrade explored barriers to coming out and saying how she feels. Cassandra Andrade also explored ways for Cassandra Andrade to express her concerns to the CNA 1:1. Cassandra Andrade reports some concerns that there would be repercussions for reporting these behaviors. Cassandra Andrade encouraged Cassandra Andrade to talk with Cassandra Andrade and to do what she needed to do in order to get the assistance she needs.  Cassandra Andrade met with Cassandra Andrade briefly at the end of session to explore her concerns about the aid. Cassandra Andrade encouraged Cassandra Andrade to talk with the case worker and to request a transfer to someone else. Cassandra Andrade agreed that CNA has been problematic and will follow up.    Plan: Return again in 2-3 weeks.  Diagnosis: Axis I:   Major Depressive Disorder, recurrent, moderate   [name], LCSW 05/16/2017

## 2017-05-17 DIAGNOSIS — I6990 Unspecified sequelae of unspecified cerebrovascular disease: Secondary | ICD-10-CM | POA: Diagnosis not present

## 2017-05-17 DIAGNOSIS — Z978 Presence of other specified devices: Secondary | ICD-10-CM | POA: Diagnosis not present

## 2017-05-17 DIAGNOSIS — F332 Major depressive disorder, recurrent severe without psychotic features: Secondary | ICD-10-CM | POA: Diagnosis not present

## 2017-05-17 DIAGNOSIS — Z89611 Acquired absence of right leg above knee: Secondary | ICD-10-CM | POA: Diagnosis not present

## 2017-05-24 DIAGNOSIS — R2689 Other abnormalities of gait and mobility: Secondary | ICD-10-CM | POA: Diagnosis not present

## 2017-05-24 DIAGNOSIS — F339 Major depressive disorder, recurrent, unspecified: Secondary | ICD-10-CM | POA: Diagnosis not present

## 2017-05-24 DIAGNOSIS — Z7901 Long term (current) use of anticoagulants: Secondary | ICD-10-CM | POA: Diagnosis not present

## 2017-05-24 DIAGNOSIS — Z89611 Acquired absence of right leg above knee: Secondary | ICD-10-CM | POA: Diagnosis not present

## 2017-05-24 DIAGNOSIS — G40909 Epilepsy, unspecified, not intractable, without status epilepticus: Secondary | ICD-10-CM | POA: Diagnosis not present

## 2017-05-24 DIAGNOSIS — Z79899 Other long term (current) drug therapy: Secondary | ICD-10-CM | POA: Diagnosis not present

## 2017-05-24 DIAGNOSIS — I69315 Cognitive social or emotional deficit following cerebral infarction: Secondary | ICD-10-CM | POA: Diagnosis not present

## 2017-05-24 DIAGNOSIS — Z85830 Personal history of malignant neoplasm of bone: Secondary | ICD-10-CM | POA: Diagnosis not present

## 2017-05-27 DIAGNOSIS — I69315 Cognitive social or emotional deficit following cerebral infarction: Secondary | ICD-10-CM | POA: Diagnosis not present

## 2017-05-27 DIAGNOSIS — R2689 Other abnormalities of gait and mobility: Secondary | ICD-10-CM | POA: Diagnosis not present

## 2017-05-27 DIAGNOSIS — G40909 Epilepsy, unspecified, not intractable, without status epilepticus: Secondary | ICD-10-CM | POA: Diagnosis not present

## 2017-05-27 DIAGNOSIS — Z85830 Personal history of malignant neoplasm of bone: Secondary | ICD-10-CM | POA: Diagnosis not present

## 2017-05-27 DIAGNOSIS — F339 Major depressive disorder, recurrent, unspecified: Secondary | ICD-10-CM | POA: Diagnosis not present

## 2017-05-27 DIAGNOSIS — Z89611 Acquired absence of right leg above knee: Secondary | ICD-10-CM | POA: Diagnosis not present

## 2017-05-30 DIAGNOSIS — R2689 Other abnormalities of gait and mobility: Secondary | ICD-10-CM | POA: Diagnosis not present

## 2017-05-30 DIAGNOSIS — Z89611 Acquired absence of right leg above knee: Secondary | ICD-10-CM | POA: Diagnosis not present

## 2017-05-30 DIAGNOSIS — Z85830 Personal history of malignant neoplasm of bone: Secondary | ICD-10-CM | POA: Diagnosis not present

## 2017-05-30 DIAGNOSIS — G40909 Epilepsy, unspecified, not intractable, without status epilepticus: Secondary | ICD-10-CM | POA: Diagnosis not present

## 2017-05-30 DIAGNOSIS — I69315 Cognitive social or emotional deficit following cerebral infarction: Secondary | ICD-10-CM | POA: Diagnosis not present

## 2017-05-30 DIAGNOSIS — F339 Major depressive disorder, recurrent, unspecified: Secondary | ICD-10-CM | POA: Diagnosis not present

## 2017-05-31 ENCOUNTER — Encounter (HOSPITAL_COMMUNITY): Payer: Self-pay | Admitting: Licensed Clinical Social Worker

## 2017-05-31 ENCOUNTER — Ambulatory Visit (INDEPENDENT_AMBULATORY_CARE_PROVIDER_SITE_OTHER): Payer: Medicare Other | Admitting: Licensed Clinical Social Worker

## 2017-05-31 DIAGNOSIS — F330 Major depressive disorder, recurrent, mild: Secondary | ICD-10-CM | POA: Diagnosis not present

## 2017-05-31 NOTE — Progress Notes (Signed)
   THERAPIST PROGRESS NOTE  Session Time: 12:45pm-1:30pm  Participation Level: Active  Behavioral Response: Well GroomedAlertAnxious  Type of Therapy: Individual Therapy  Treatment Goals addressed: improve psychiatric symptoms, elevate mood, Improve unhelpful thought patterns, interpersonal relationship skills, learn about diagnosis, healthy coping skills    Interventions: CBT and Motivational Interviewing, Grounding and Mindfulness Techniques, psychoeducation   Summary: Cassandra Andrade is a 52 y.o. female who presents with Major Depressive Disorder, recurrent, mild  Suicidal/Homicidal: No -without intent/plan  Therapist Response: [name] met with clinician for an individual session. [name] discussed her psychiatric symptoms, her current life events and her homework. She shared that things have been a bit better with her aid, but she still has some complaints. Clinician explored reasons why there have been no conversations about the aid's behaviors and [name]'s unwillingness to stand up for herself. [name] reports she has been trying to get along and not make waves. However, she did identify a couple of incidents where she stood up for herself. Clinician discussed self esteem and noted that [name] has been feeling good about herself. [name] processed her relationship with her sons. She discussed the birth stories of each child and her relationship with them    Plan: Return again in 2-3 weeks.  Diagnosis: Axis I:   Major Depressive Disorder, recurrent, mild    [name], LCSW 05/31/2017

## 2017-06-01 DIAGNOSIS — Z86718 Personal history of other venous thrombosis and embolism: Secondary | ICD-10-CM | POA: Diagnosis not present

## 2017-06-01 DIAGNOSIS — G40909 Epilepsy, unspecified, not intractable, without status epilepticus: Secondary | ICD-10-CM | POA: Diagnosis not present

## 2017-06-01 DIAGNOSIS — R2689 Other abnormalities of gait and mobility: Secondary | ICD-10-CM | POA: Diagnosis not present

## 2017-06-01 DIAGNOSIS — Z89611 Acquired absence of right leg above knee: Secondary | ICD-10-CM | POA: Diagnosis not present

## 2017-06-01 DIAGNOSIS — Z85830 Personal history of malignant neoplasm of bone: Secondary | ICD-10-CM | POA: Diagnosis not present

## 2017-06-01 DIAGNOSIS — I69315 Cognitive social or emotional deficit following cerebral infarction: Secondary | ICD-10-CM | POA: Diagnosis not present

## 2017-06-01 DIAGNOSIS — F339 Major depressive disorder, recurrent, unspecified: Secondary | ICD-10-CM | POA: Diagnosis not present

## 2017-06-03 DIAGNOSIS — G40909 Epilepsy, unspecified, not intractable, without status epilepticus: Secondary | ICD-10-CM | POA: Diagnosis not present

## 2017-06-03 DIAGNOSIS — I69315 Cognitive social or emotional deficit following cerebral infarction: Secondary | ICD-10-CM | POA: Diagnosis not present

## 2017-06-03 DIAGNOSIS — R2689 Other abnormalities of gait and mobility: Secondary | ICD-10-CM | POA: Diagnosis not present

## 2017-06-03 DIAGNOSIS — Z89611 Acquired absence of right leg above knee: Secondary | ICD-10-CM | POA: Diagnosis not present

## 2017-06-03 DIAGNOSIS — F339 Major depressive disorder, recurrent, unspecified: Secondary | ICD-10-CM | POA: Diagnosis not present

## 2017-06-03 DIAGNOSIS — Z85830 Personal history of malignant neoplasm of bone: Secondary | ICD-10-CM | POA: Diagnosis not present

## 2017-06-06 DIAGNOSIS — F339 Major depressive disorder, recurrent, unspecified: Secondary | ICD-10-CM | POA: Diagnosis not present

## 2017-06-06 DIAGNOSIS — Z89611 Acquired absence of right leg above knee: Secondary | ICD-10-CM | POA: Diagnosis not present

## 2017-06-06 DIAGNOSIS — R2689 Other abnormalities of gait and mobility: Secondary | ICD-10-CM | POA: Diagnosis not present

## 2017-06-06 DIAGNOSIS — Z85830 Personal history of malignant neoplasm of bone: Secondary | ICD-10-CM | POA: Diagnosis not present

## 2017-06-06 DIAGNOSIS — I69315 Cognitive social or emotional deficit following cerebral infarction: Secondary | ICD-10-CM | POA: Diagnosis not present

## 2017-06-06 DIAGNOSIS — G40909 Epilepsy, unspecified, not intractable, without status epilepticus: Secondary | ICD-10-CM | POA: Diagnosis not present

## 2017-06-08 DIAGNOSIS — Z89611 Acquired absence of right leg above knee: Secondary | ICD-10-CM | POA: Diagnosis not present

## 2017-06-08 DIAGNOSIS — R2689 Other abnormalities of gait and mobility: Secondary | ICD-10-CM | POA: Diagnosis not present

## 2017-06-08 DIAGNOSIS — Z85830 Personal history of malignant neoplasm of bone: Secondary | ICD-10-CM | POA: Diagnosis not present

## 2017-06-08 DIAGNOSIS — F339 Major depressive disorder, recurrent, unspecified: Secondary | ICD-10-CM | POA: Diagnosis not present

## 2017-06-08 DIAGNOSIS — I69315 Cognitive social or emotional deficit following cerebral infarction: Secondary | ICD-10-CM | POA: Diagnosis not present

## 2017-06-08 DIAGNOSIS — G40909 Epilepsy, unspecified, not intractable, without status epilepticus: Secondary | ICD-10-CM | POA: Diagnosis not present

## 2017-06-10 DIAGNOSIS — I69315 Cognitive social or emotional deficit following cerebral infarction: Secondary | ICD-10-CM | POA: Diagnosis not present

## 2017-06-10 DIAGNOSIS — Z89611 Acquired absence of right leg above knee: Secondary | ICD-10-CM | POA: Diagnosis not present

## 2017-06-10 DIAGNOSIS — F339 Major depressive disorder, recurrent, unspecified: Secondary | ICD-10-CM | POA: Diagnosis not present

## 2017-06-10 DIAGNOSIS — Z85830 Personal history of malignant neoplasm of bone: Secondary | ICD-10-CM | POA: Diagnosis not present

## 2017-06-10 DIAGNOSIS — G40909 Epilepsy, unspecified, not intractable, without status epilepticus: Secondary | ICD-10-CM | POA: Diagnosis not present

## 2017-06-10 DIAGNOSIS — R2689 Other abnormalities of gait and mobility: Secondary | ICD-10-CM | POA: Diagnosis not present

## 2017-06-13 DIAGNOSIS — F339 Major depressive disorder, recurrent, unspecified: Secondary | ICD-10-CM | POA: Diagnosis not present

## 2017-06-13 DIAGNOSIS — G40909 Epilepsy, unspecified, not intractable, without status epilepticus: Secondary | ICD-10-CM | POA: Diagnosis not present

## 2017-06-13 DIAGNOSIS — Z85830 Personal history of malignant neoplasm of bone: Secondary | ICD-10-CM | POA: Diagnosis not present

## 2017-06-13 DIAGNOSIS — Z89611 Acquired absence of right leg above knee: Secondary | ICD-10-CM | POA: Diagnosis not present

## 2017-06-13 DIAGNOSIS — I69315 Cognitive social or emotional deficit following cerebral infarction: Secondary | ICD-10-CM | POA: Diagnosis not present

## 2017-06-13 DIAGNOSIS — R2689 Other abnormalities of gait and mobility: Secondary | ICD-10-CM | POA: Diagnosis not present

## 2017-06-14 DIAGNOSIS — Z85830 Personal history of malignant neoplasm of bone: Secondary | ICD-10-CM | POA: Diagnosis not present

## 2017-06-14 DIAGNOSIS — F339 Major depressive disorder, recurrent, unspecified: Secondary | ICD-10-CM | POA: Diagnosis not present

## 2017-06-14 DIAGNOSIS — R2689 Other abnormalities of gait and mobility: Secondary | ICD-10-CM | POA: Diagnosis not present

## 2017-06-14 DIAGNOSIS — G40909 Epilepsy, unspecified, not intractable, without status epilepticus: Secondary | ICD-10-CM | POA: Diagnosis not present

## 2017-06-14 DIAGNOSIS — Z89611 Acquired absence of right leg above knee: Secondary | ICD-10-CM | POA: Diagnosis not present

## 2017-06-14 DIAGNOSIS — I69315 Cognitive social or emotional deficit following cerebral infarction: Secondary | ICD-10-CM | POA: Diagnosis not present

## 2017-06-22 ENCOUNTER — Other Ambulatory Visit (INDEPENDENT_AMBULATORY_CARE_PROVIDER_SITE_OTHER): Payer: Self-pay

## 2017-06-22 ENCOUNTER — Telehealth: Payer: Self-pay | Admitting: Neurology

## 2017-06-22 DIAGNOSIS — Z0289 Encounter for other administrative examinations: Secondary | ICD-10-CM

## 2017-06-22 DIAGNOSIS — Z86718 Personal history of other venous thrombosis and embolism: Secondary | ICD-10-CM

## 2017-06-22 DIAGNOSIS — Z5181 Encounter for therapeutic drug level monitoring: Secondary | ICD-10-CM

## 2017-06-22 NOTE — Telephone Encounter (Signed)
I called the patient.  The patient is to come in for a recheck on liver enzymes.  I will place the orders.

## 2017-06-23 LAB — COMPREHENSIVE METABOLIC PANEL
A/G RATIO: 1.6 (ref 1.2–2.2)
ALBUMIN: 4.4 g/dL (ref 3.5–5.5)
ALK PHOS: 114 IU/L (ref 39–117)
ALT: 73 IU/L — ABNORMAL HIGH (ref 0–32)
AST: 47 IU/L — AB (ref 0–40)
BUN / CREAT RATIO: 18 (ref 9–23)
BUN: 18 mg/dL (ref 6–24)
Bilirubin Total: <0.2 mg/dL (ref 0.0–1.2)
CO2: 27 mmol/L (ref 20–29)
CREATININE: 1.02 mg/dL — AB (ref 0.57–1.00)
Calcium: 9.5 mg/dL (ref 8.7–10.2)
Chloride: 100 mmol/L (ref 96–106)
GFR calc Af Amer: 74 mL/min/{1.73_m2} (ref >59)
GFR calc non Af Amer: 64 mL/min/{1.73_m2} (ref >59)
GLOBULIN, TOTAL: 2.8 g/dL (ref 1.5–4.5)
Glucose: 74 mg/dL (ref 65–99)
POTASSIUM: 4.3 mmol/L (ref 3.5–5.2)
SODIUM: 142 mmol/L (ref 134–144)
Total Protein: 7.2 g/dL (ref 6.0–8.5)

## 2017-06-29 DIAGNOSIS — Z86718 Personal history of other venous thrombosis and embolism: Secondary | ICD-10-CM | POA: Diagnosis not present

## 2017-06-30 ENCOUNTER — Encounter (HOSPITAL_COMMUNITY): Payer: Self-pay | Admitting: Licensed Clinical Social Worker

## 2017-06-30 ENCOUNTER — Ambulatory Visit (INDEPENDENT_AMBULATORY_CARE_PROVIDER_SITE_OTHER): Payer: Medicare Other | Admitting: Licensed Clinical Social Worker

## 2017-06-30 DIAGNOSIS — F330 Major depressive disorder, recurrent, mild: Secondary | ICD-10-CM | POA: Diagnosis not present

## 2017-06-30 NOTE — Progress Notes (Signed)
   THERAPIST PROGRESS NOTE  Session Time: 1:30pm-2:30pm  Participation Level: Active  Behavioral Response: NeatAlertDepressed  Type of Therapy: Individual Therapy  Treatment Goals addressed: improve psychiatric symptoms, elevate mood, Improve unhelpful thought patterns, interpersonal relationship skills, learn about diagnosis, healthy coping skills    Interventions: CBT and Motivational Interviewing, Grounding and Mindfulness Techniques, psychoeducation   Summary: Cassandra Andrade is a 52 y.o. female who presents with Major Depressive Disorder, recurrent, mild  Suicidal/Homicidal: No -without intent/plan  Therapist Response: Cassandra Andrade met with clinician for an individual session. [name] discussed her psychiatric symptoms, her current life events and her homework. She shared continuing problems with her aid, who can be rude and disrespectful at times. Clinician processed recent issues and continued to explore hesitation for not making a change. Clinician utilized CBT cost-benefits analysis and reality testing to assist Cassandra Andrade in understanding her options. Clinician also reflected guilt and concerns that this woman would lose her job and not be able to support her family. Clinician processed thoughts and feelings. Clinician also encouraged Cassandra Andrade to consider her own needs and to not put the other person first all the time.  Cassandra Andrade responded well and agreed to discuss this with mother and the supervisor.     Plan: Return again in 2-3 weeks.  Diagnosis: Axis I:   Major Depressive Disorder, recurrent, mild    [name], LCSW 06/30/2017

## 2017-07-11 ENCOUNTER — Ambulatory Visit (INDEPENDENT_AMBULATORY_CARE_PROVIDER_SITE_OTHER): Payer: Medicare Other | Admitting: Licensed Clinical Social Worker

## 2017-07-11 ENCOUNTER — Encounter (HOSPITAL_COMMUNITY): Payer: Self-pay | Admitting: Licensed Clinical Social Worker

## 2017-07-11 DIAGNOSIS — F330 Major depressive disorder, recurrent, mild: Secondary | ICD-10-CM

## 2017-07-11 NOTE — Progress Notes (Signed)
   THERAPIST PROGRESS NOTE  Session Time: 1:30pm-2:30pm  Participation Level: Active  Type of Therapy: Individual Therapy  Treatment Goals addressed: improve psychiatric symptoms, elevate mood, Improve unhelpful thought patterns, interpersonal relationship skills, learn about diagnosis, healthy coping skills    Interventions: CBT and Motivational Interviewing, Grounding and Mindfulness Techniques, psychoeducation   Summary: Blayre Papania is a 52 y.o. female who presents with Major Depressive Disorder, recurrent, mild  Suicidal/Homicidal: No -without intent/plan  Therapist Response: Gracyn met with clinician for an individual session. Koryn discussed her psychiatric symptoms, her current life events and her homework. She shared that she has been feeling good and continues to work on her physical strength with walking using her prosthetic leg. Clinician explored the details of her leg, the feeling of wearing it and using it, as well as her goals for walking without a walker. Clinician utilized MI OARS to reflect and summarize thoughts and feelings about her leg. Clinician also discussed Aivah's view of her progress, including her pride for how far she has come over the years. Clinician noted that while she has not yet met her goals, she can be inspiring to others, considering her physical health challenges (cancer, aneurysm, stroke, etc). Clinician discussed the possibility of Shelma using bibliotherapy and writing her story. Clinician provided psychoeducation about the value of writing therapeutically and noted the impact she may have on others by sharing her story. Aza reported she would consider this as part of her homework.  Joetta reported she and mom talked to the nurse aid about her attitude and things have been a little better. She will continue to note problems and reports feeling more comfortable with expressing herself.     Plan: Return again in 2-3 weeks.  Diagnosis: Axis I:    Major Depressive Disorder, recurrent, mild  [name], LCSW 07/11/2017

## 2017-07-12 ENCOUNTER — Other Ambulatory Visit: Payer: Self-pay | Admitting: Neurology

## 2017-07-20 ENCOUNTER — Ambulatory Visit (HOSPITAL_COMMUNITY): Payer: Self-pay | Admitting: Psychiatry

## 2017-07-26 ENCOUNTER — Ambulatory Visit (HOSPITAL_COMMUNITY): Payer: Self-pay | Admitting: Licensed Clinical Social Worker

## 2017-07-27 DIAGNOSIS — Z86718 Personal history of other venous thrombosis and embolism: Secondary | ICD-10-CM | POA: Diagnosis not present

## 2017-08-09 ENCOUNTER — Ambulatory Visit (HOSPITAL_COMMUNITY): Payer: Medicare Other | Admitting: Licensed Clinical Social Worker

## 2017-08-15 ENCOUNTER — Ambulatory Visit (HOSPITAL_COMMUNITY): Payer: Medicare Other | Admitting: Psychiatry

## 2017-08-17 ENCOUNTER — Ambulatory Visit (HOSPITAL_COMMUNITY): Payer: Self-pay | Admitting: Licensed Clinical Social Worker

## 2017-08-21 ENCOUNTER — Other Ambulatory Visit (HOSPITAL_COMMUNITY): Payer: Self-pay | Admitting: Psychiatry

## 2017-08-21 DIAGNOSIS — F321 Major depressive disorder, single episode, moderate: Secondary | ICD-10-CM

## 2017-08-23 ENCOUNTER — Encounter (HOSPITAL_COMMUNITY): Payer: Self-pay | Admitting: Licensed Clinical Social Worker

## 2017-08-23 ENCOUNTER — Ambulatory Visit (INDEPENDENT_AMBULATORY_CARE_PROVIDER_SITE_OTHER): Payer: Medicare Other | Admitting: Licensed Clinical Social Worker

## 2017-08-23 DIAGNOSIS — F330 Major depressive disorder, recurrent, mild: Secondary | ICD-10-CM | POA: Diagnosis not present

## 2017-08-23 NOTE — Progress Notes (Signed)
   THERAPIST PROGRESS NOTE  Session Time: 1:30pm-2:30pm  Participation Level: Active  Behavioral Response: Well GroomedAlertEuthymic  Type of Therapy: Individual Therapy  Treatment Goals addressed: improve psychiatric symptoms, elevate mood, Improve unhelpful thought patterns, interpersonal relationship skills, learn about diagnosis, healthy coping skills    Interventions: CBT and Motivational Interviewing, Grounding and Mindfulness Techniques, psychoeducation   Summary: Adrianah Prophete is a 52 y.o. female who presents with Major Depressive Disorder, recurrent, mild  Suicidal/Homicidal: No -without intent/plan  Therapist Response: Nohemi met with clinician for an individual session. Jimmie discussed her psychiatric symptoms, her current life events and her homework. She shared that she got a new aid, who is much younger, brighter, and generally happier and nicer to be around. Caeley presented with well-groomed hair and lip gloss. She smiled for most of the session and reported feeling happy. Clinician reflected the change in her mood and attitude. Clinician also discussed the "final straw" when it came to the other aid and noted how easy the transition had been. Clinician encouraged Courtny to continue open communication with mother, aid, and case manager to ensure appropriate care. Boluwatife identified her next personal goal as being able to walk on her own with a cane. Clinician discussed the importance of being able to stay focused and to remain positive.    Plan: Return again in 4 weeks.  Diagnosis: Axis I:   Major Depressive Disorder, recurrent, mild   [name] 08/23/2017

## 2017-08-24 DIAGNOSIS — Z86718 Personal history of other venous thrombosis and embolism: Secondary | ICD-10-CM | POA: Diagnosis not present

## 2017-09-05 ENCOUNTER — Ambulatory Visit (HOSPITAL_COMMUNITY): Payer: Self-pay | Admitting: Psychiatry

## 2017-09-06 ENCOUNTER — Ambulatory Visit (HOSPITAL_COMMUNITY): Payer: Self-pay | Admitting: Licensed Clinical Social Worker

## 2017-09-20 ENCOUNTER — Ambulatory Visit (HOSPITAL_COMMUNITY): Payer: Medicare Other | Admitting: Licensed Clinical Social Worker

## 2017-09-22 DIAGNOSIS — Z86718 Personal history of other venous thrombosis and embolism: Secondary | ICD-10-CM | POA: Diagnosis not present

## 2017-10-12 ENCOUNTER — Ambulatory Visit (INDEPENDENT_AMBULATORY_CARE_PROVIDER_SITE_OTHER): Payer: Medicare Other | Admitting: Licensed Clinical Social Worker

## 2017-10-12 ENCOUNTER — Encounter (HOSPITAL_COMMUNITY): Payer: Self-pay | Admitting: Licensed Clinical Social Worker

## 2017-10-12 DIAGNOSIS — F330 Major depressive disorder, recurrent, mild: Secondary | ICD-10-CM | POA: Diagnosis not present

## 2017-10-12 NOTE — Progress Notes (Signed)
   THERAPIST PROGRESS NOTE  Session Time: 2:30pm-3:30pm  Participation Level: Active  Behavioral Response: Well GroomedAlertEuthymic  Type of Therapy: Individual Therapy  Treatment Goals addressed: improve psychiatric symptoms, elevate mood, Improve unhelpful thought patterns, interpersonal relationship skills, learn about diagnosis, healthy coping skills    Interventions: CBT and Motivational Interviewing, Grounding and Mindfulness Techniques, psychoeducation   Summary: Cassandra Andrade is a 52 y.o. female who presents with Major Depressive Disorder, recurrent, mild  Suicidal/Homicidal: No -without intent/plan  Therapist Response: Tawnia met with clinician for an individual session. Lynanne discussed her psychiatric symptoms, her current life events and her homework. She shared a great deal of improvement in her mood since she had gotten a new CNA. She reports the new CNA is young, gets her sense of humor, and is bright and energetic. [name] identified that she feels more comfortable at home now that she does not have someone who is 'being bossy" and telling her what to do all the time. Clinician processed thoughts and feelings, as well as the differences the people in your environment can make in daily life. [name] discussed her involvement and care of new 55 year old niece and related her good mood to spending time with the baby as well.    Plan: Return again in 4 weeks.  Diagnosis: Axis I:   Major Depressive Disorder, recurrent, mild  [name], LCSW 10/12/2017

## 2017-10-14 ENCOUNTER — Other Ambulatory Visit: Payer: Self-pay | Admitting: Nurse Practitioner

## 2017-10-19 ENCOUNTER — Other Ambulatory Visit: Payer: Self-pay | Admitting: Nurse Practitioner

## 2017-10-20 DIAGNOSIS — Z86718 Personal history of other venous thrombosis and embolism: Secondary | ICD-10-CM | POA: Diagnosis not present

## 2017-11-10 ENCOUNTER — Ambulatory Visit (INDEPENDENT_AMBULATORY_CARE_PROVIDER_SITE_OTHER): Payer: Medicare Other | Admitting: Neurology

## 2017-11-10 ENCOUNTER — Encounter: Payer: Self-pay | Admitting: Neurology

## 2017-11-10 ENCOUNTER — Ambulatory Visit (HOSPITAL_COMMUNITY): Payer: Self-pay | Admitting: Licensed Clinical Social Worker

## 2017-11-10 VITALS — BP 116/73 | HR 82 | Ht 67.5 in | Wt 175.5 lb

## 2017-11-10 DIAGNOSIS — G40919 Epilepsy, unspecified, intractable, without status epilepticus: Secondary | ICD-10-CM | POA: Diagnosis not present

## 2017-11-10 MED ORDER — LACOSAMIDE 200 MG PO TABS: 200.0000 mg | ORAL_TABLET | Freq: Two times a day (BID) | ORAL | 1 refills | Status: DC

## 2017-11-10 NOTE — Patient Instructions (Signed)
Increase the Vimpat 200 mg twice a day.

## 2017-11-10 NOTE — Progress Notes (Signed)
Reason for visit: Intractable seizures  Cassandra Andrade is an 52 y.o. female  History of present illness:  Cassandra Andrade is a 52 year old right-handed black female with a history of intractable seizures.  The patient has a history of cerebral aneurysm and a right brain stroke, she has a left homonymous visual field deficit, mild left hemiparesis.  The patient has a prosthetic right leg, above-knee amputation.  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  The patient continues to have seizures with some regularity, she may sometimes go 3 days or so without a seizure, she usually has seizures on a daily basis, she has already had one this morning.  The patient sometimes can feel the seizure coming on and swiped the vagal nerve stimulator.  The patient has had one fall since last seen.  She reports no new numbness or weakness of the face, arms, legs.  She is sleeping well, she is eating well.  She returns for an evaluation.  Past Medical History:  Diagnosis Date  . Anxiety   . Arthritis   . Cancer (HCC)    Osteosarcoma, right leg  . CVA (cerebrovascular accident due to intracerebral hemorrhage) ([place]) 2004  . Depression   . DVT (deep venous thrombosis) (HCC)    Left leg  . MRSA (methicillin resistant Staphylococcus aureus)   . 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 Seizures ([place])   . Thyroid disease   . 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 ABOVE KNEE LEG AMPUTATION Right    Osteosarcoma  . BRAIN SURGERY  2004   cerebral aneurysm  . FRACTURE SURGERY     Rt. femur, folllowed by MRSA  . TOTAL KNEE ARTHROPLASTY Right     Family History  Problem Relation Age of Onset  . Anxiety disorder Mother   . Diabetes Father     Social history:  reports that she has never smoked. She has never used smokeless tobacco. She reports that she does not drink alcohol or use  drugs.    Allergies  Allergen Reactions  . Vicodin [Hydrocodone-Acetaminophen] Nausea And Vomiting  . Morphine Rash  . Morphine And Related Rash    Allergic reaction only in iv form    Medications:  Prior to Admission medications   Medication Sig Start Date End Date Taking? Authorizing Provider  acetaminophen (TYLENOL) 500 MG tablet Take 1,000 mg by mouth every 8 (eight) hours as needed.   Yes [provider]  Ascorbic Acid (VITAMIN C) 1000 MG tablet Take 1,000 mg by mouth daily.  08/17/11  Yes [provider]  Calcium Carbonate-Vitamin D 600-400 MG-UNIT per tablet Take 1 tablet by mouth daily.    Yes [provider]  diazepam (DIASAT) 20 MG GEL Place 20 mg rectally daily as needed. 05/04/17  Yes Kathrynn Ducking, MD  ketoconazole (NIZORAL) 2 % shampoo APPLY 1 APPLICATION TOPICALLY TWICE A WEEK. 01/21/17  Yes [provider]  lamoTRIgine (LAMICTAL) 200 MG tablet Take 1 tablet (200 mg total) by mouth 2 (two) times daily. 05/04/17  Yes Kathrynn Ducking, MD  lamoTRIgine (LAMICTAL) 25 MG tablet 2 TABLETS TWICE A DAY 05/04/17  Yes Kathrynn Ducking, MD  levETIRAcetam (KEPPRA) 1000 MG tablet TAKE 2 TABLETS (2,000 MG TOTAL) BY MOUTH 2 (TWO) TIMES DAILY. 07/12/17  Yes Kathrynn Ducking, MD  levothyroxine (SYNTHROID, LEVOTHROID) 50 MCG  tablet Take 50 mcg by mouth daily before breakfast.   Yes [provider]  promethazine (PHENERGAN) 25 MG tablet Take 25 mg by mouth every 6 (six) hours as needed. nausea   Yes [provider]  ranitidine (ZANTAC) 150 MG tablet Take 150 mg by mouth daily. 03/25/17  Yes [provider]  venlafaxine XR (EFFEXOR-XR) 150 MG 24 hr capsule TAKE 1 CAPSULE (150 MG TOTAL) BY MOUTH DAILY WITH BREAKFAST. 08/31/17  Yes Hampton Abbot, MD  warfarin (COUMADIN) 5 MG tablet Take 5 mg by mouth 2 (two) times daily. Presently taking alternating dose 1 & 1/2 tab x 4 days & 1 tab x 3 days   Yes [provider]  lacosamide  (VIMPAT) 200 MG TABS tablet Take 1 tablet (200 mg total) by mouth 2 (two) times daily. 11/10/17   Kathrynn Ducking, MD    ROS:  Out of a complete 14 system review of symptoms, the patient complains only of the following symptoms, and all other reviewed systems are negative.  Memory loss, seizures, tremors  Blood pressure 116/73, pulse 82, height 5' 7.5" (1.715 m), weight 175 lb 8 oz (79.6 kg).  Physical Exam  General: The patient is alert and cooperative at the time of the examination.  Skin: No significant peripheral edema is noted.  The patient has an above-knee amputation on the right with a prosthetic limb.   Neurologic Exam  Mental status: The patient is alert and oriented x 3 at the time of the examination. The patient has apparent normal recent and remote memory, with an apparently normal attention span and concentration ability.   Cranial nerves: Facial symmetry is present. Speech is normal, no aphasia or dysarthria is noted. Extraocular movements are full with the exception that there is restriction of vertical gaze. Visual fields are notable for left homonymous visual field deficit.  Motor: The patient has good strength in all 4 extremities.  The patient has a prosthetic right lower extremity, above-knee amputation.  Sensory examination: Soft touch sensation is symmetric on the face, arms, and legs.  Coordination: The patient has good finger-nose-finger and heel-to-shin bilaterally, with exception that the patient is unable to perform heel-to-shin with the right leg.  Gait and station: The patient has a wide-based gait.  The patient uses a walker for ambulation.  Reflexes: Deep tendon reflexes are symmetric.  Assessment/Plan:  1.  Intractable seizure disorder  2.  History of cerebral aneurysm, right brain stroke  3.  Vagal nerve stimulator placement  The patient continues to have seizures with some regularity.  The patient will be increased on the  Vimpat taking 200 mg twice daily, the patient seems to be tolerating the medications well.  She will remain on her current dose of Lamictal and Keppra.  She will follow-up in 6 months.   Jill Alexanders MD 11/10/2017 11:41 AM  [facility] [address] [place], [place] [address]  Phone [phone] Fax [phone]

## 2017-11-16 DIAGNOSIS — Z86718 Personal history of other venous thrombosis and embolism: Secondary | ICD-10-CM | POA: Diagnosis not present

## 2017-11-20 ENCOUNTER — Other Ambulatory Visit (HOSPITAL_COMMUNITY): Payer: Self-pay | Admitting: Psychiatry

## 2017-11-20 DIAGNOSIS — F321 Major depressive disorder, single episode, moderate: Secondary | ICD-10-CM

## 2017-12-01 ENCOUNTER — Other Ambulatory Visit: Payer: Self-pay

## 2017-12-01 ENCOUNTER — Encounter (HOSPITAL_COMMUNITY): Payer: Self-pay

## 2017-12-01 ENCOUNTER — Emergency Department (HOSPITAL_COMMUNITY)
Admission: EM | Admit: 2017-12-01 | Discharge: 2017-12-01 | Disposition: A | Discharge status: Home / Self Care | Payer: Medicare Other | Attending: Emergency Medicine | Admitting: Emergency Medicine

## 2017-12-01 DIAGNOSIS — E039 Hypothyroidism, unspecified: Secondary | ICD-10-CM | POA: Diagnosis not present

## 2017-12-01 DIAGNOSIS — Z85830 Personal history of malignant neoplasm of bone: Secondary | ICD-10-CM | POA: Insufficient documentation

## 2017-12-01 DIAGNOSIS — L02811 Cutaneous abscess of head [any part, except face]: Secondary | ICD-10-CM | POA: Insufficient documentation

## 2017-12-01 DIAGNOSIS — Z89611 Acquired absence of right leg above knee: Secondary | ICD-10-CM | POA: Insufficient documentation

## 2017-12-01 DIAGNOSIS — Z7901 Long term (current) use of anticoagulants: Secondary | ICD-10-CM | POA: Diagnosis not present

## 2017-12-01 DIAGNOSIS — L0291 Cutaneous abscess, unspecified: Secondary | ICD-10-CM

## 2017-12-01 DIAGNOSIS — R220 Localized swelling, mass and lump, head: Secondary | ICD-10-CM | POA: Diagnosis present

## 2017-12-01 DIAGNOSIS — Z79899 Other long term (current) drug therapy: Secondary | ICD-10-CM | POA: Diagnosis not present

## 2017-12-01 MED ORDER — ACETAMINOPHEN 325 MG PO TABS
650.0000 mg | ORAL_TABLET | Freq: Once | ORAL | Status: AC
  Administered 2017-12-01: 650 mg via ORAL
  Filled 2017-12-01: qty 2

## 2017-12-01 MED ORDER — DOXYCYCLINE HYCLATE 100 MG PO CAPS: 100.0000 mg | ORAL_CAPSULE | Freq: Two times a day (BID) | ORAL | 0 refills | Status: DC

## 2017-12-01 MED ORDER — LIDOCAINE-EPINEPHRINE (PF) 2 %-1:200000 IJ SOLN
10.0000 mL | Freq: Once | INTRAMUSCULAR | Status: AC
  Administered 2017-12-01: 10 mL
  Filled 2017-12-01: qty 20

## 2017-12-01 NOTE — ED Triage Notes (Signed)
Pt endorses knot to left posterior head x 3 days, redness noted to area. VSS.

## 2017-12-01 NOTE — ED Notes (Signed)
Pt verbalized understanding of discharge instructions and denies any further questions at this time.   

## 2017-12-01 NOTE — ED Notes (Signed)
ED Provider at bedside. 

## 2017-12-01 NOTE — ED Provider Notes (Signed)
[facility] EMERGENCY DEPARTMENT Provider Note   CSN: [id number] Arrival date & time: 12/01/17  9937     History   Chief Complaint Chief Complaint  Patient presents with  . Cyst    HPI Maily Hole is a 52 y.o. female with extensive past medical hx as listed below including prior MRSA infection who presents to the ED with complaints of painful red area to the L posterior head x 2-3 days.  Patient states she has had a bump to the back of the left side of her head which is painful and has appeared red.  She states discomfort has been progressively worsening, and it started to radiate to the generalized.  Current discomfort is a 5/10 in severity, no specific alleviating/aggravating factors. Denies fever, chills, numbness, weakness, nausea, vomiting, or neck stiffness. No prior injury to this area.   HPI  Past Medical History:  Diagnosis Date  . Anxiety   . Arthritis   . Cancer (HCC)    Osteosarcoma, right leg  . CVA (cerebrovascular accident due to intracerebral hemorrhage) ([place]) 2004  . Depression   . DVT (deep venous thrombosis) (HCC)    Left leg  . MRSA (methicillin resistant Staphylococcus aureus)   . 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 Seizures ([place])   . Thyroid disease   . 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 History of deep venous thrombosis 05/05/2015  . Personal history of osteosarcoma 05/05/2015  . Anemia 11/04/2014  . Abnormal finding on mammography 05/18/2014  . Major depression, recurrent ([place]) 03/19/2014  . Vision disturbance 02/28/2014  . Postprocedural state 12/31/2013  . History of biliary T-tube placement 12/31/2013  . Lupus anticoagulant disorder ([place]) 12/20/2013  . Back pain, chronic 11/07/2013  . Absence of bladder continence 08/05/2013  . History of anticoagulant therapy 07/17/2013  . Long term current use of anticoagulant  therapy 07/17/2013  . Partial epilepsy with impairment of consciousness ([place]) 06/21/2013  . Intractable epilepsy ([place]) 02/28/2013  . Absolute anemia 02/28/2013  . Seizures ([place]) 02/21/2013  . Seizure ([place]) 02/17/2013  . DVT, bilateral lower limbs ([place]) 11/07/2012  . Hypothyroid 07/04/2012  . Seizure disorder ([place]) 07/04/2012  . Chronic pain syndrome 07/04/2012  . S/P BKA (below knee amputation) ([place]) 07/04/2012  . Chronic pain associated with significant psychosocial dysfunction 07/04/2012  . Cerebrovascular accident, late effects 06/05/2012  . Status post above knee amputation 06/08/2011  . Mechanical complication of graft of bone, cartilage, muscle or tendon 05/10/2011  . Depression 05/03/2011  . 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 ABOVE KNEE LEG AMPUTATION Right    Osteosarcoma  . BRAIN SURGERY  2004   cerebral aneurysm  . FRACTURE SURGERY     Rt. femur, folllowed by MRSA  . TOTAL KNEE ARTHROPLASTY Right      OB History   None      Home Medications    Prior to Admission medications   Medication Sig Start Date End Date Taking? Authorizing Provider  acetaminophen (TYLENOL) 500 MG tablet Take 1,000 mg by mouth every 8 (eight) hours as needed.    [provider]  Ascorbic Acid (VITAMIN C) 1000 MG tablet Take 1,000 mg by mouth daily.  08/17/11   [provider]  Calcium Carbonate-Vitamin D 600-400 MG-UNIT per tablet Take 1 tablet by mouth daily.  [provider]  diazepam (DIASAT) 20 MG GEL Place 20 mg rectally daily as needed. 05/04/17   Kathrynn Ducking, MD  ketoconazole (NIZORAL) 2 % shampoo APPLY 1 APPLICATION TOPICALLY TWICE A WEEK. 01/21/17   [provider]  lacosamide (VIMPAT) 200 MG TABS tablet Take 1 tablet (200 mg total) by mouth 2 (two) times daily. 11/10/17   Kathrynn Ducking, MD  lamoTRIgine (LAMICTAL) 200 MG tablet Take 1 tablet (200 mg total) by mouth  2 (two) times daily. 05/04/17   Kathrynn Ducking, MD  lamoTRIgine (LAMICTAL) 25 MG tablet 2 TABLETS TWICE A DAY 05/04/17   Kathrynn Ducking, MD  levETIRAcetam (KEPPRA) 1000 MG tablet TAKE 2 TABLETS (2,000 MG TOTAL) BY MOUTH 2 (TWO) TIMES DAILY. 07/12/17   Kathrynn Ducking, MD  levothyroxine (SYNTHROID, LEVOTHROID) 50 MCG tablet Take 50 mcg by mouth daily before breakfast.    [provider]  promethazine (PHENERGAN) 25 MG tablet Take 25 mg by mouth every 6 (six) hours as needed. nausea    [provider]  ranitidine (ZANTAC) 150 MG tablet Take 150 mg by mouth daily. 03/25/17   [provider]  venlafaxine XR (EFFEXOR-XR) 150 MG 24 hr capsule TAKE 1 CAPSULE (150 MG TOTAL) BY MOUTH DAILY WITH BREAKFAST. 11/25/17   Hampton Abbot, MD  warfarin (COUMADIN) 5 MG tablet Take 5 mg by mouth 2 (two) times daily. Presently taking alternating dose 1 & 1/2 tab x 4 days & 1 tab x 3 days    [provider]    Family History Family History  Problem Relation Age of Onset  . Anxiety disorder Mother   . Diabetes Father     Social History Social History   Tobacco Use  . Smoking status: Never Smoker  . Smokeless tobacco: Never Used  . Tobacco comment: never used tobacco  Substance Use Topics  . Alcohol use: No    Alcohol/week: 0.0 standard drinks  . Drug use: No     Allergies   Vicodin [hydrocodone-acetaminophen]; Morphine; and Morphine and related   Review of Systems Review of Systems  Constitutional: Negative for chills and fever.  Gastrointestinal: Negative for nausea and vomiting.  Skin: Positive for wound.  Neurological: Negative for weakness and numbness.     Physical Exam Updated Vital Signs BP (!) 146/92 (BP Location: Left Arm)   Pulse 90   Temp 98.3 F (36.8 C) (Oral)   Resp 16   SpO2 100%   Physical Exam  Constitutional: She appears well-developed and well-nourished. No distress.  HENT:  Head: Head is without raccoon's eyes and without  Battle's sign.    Eyes: Pupils are equal, round, and reactive to light. Conjunctivae and EOM are normal. Right eye exhibits no discharge. Left eye exhibits no discharge.  Neck: Normal range of motion. No neck rigidity.  Cardiovascular: Normal rate and regular rhythm.  Pulmonary/Chest: Effort normal.  Neurological: She is alert.  Clear speech.   Psychiatric: She has a normal mood and affect. Her behavior is normal. Thought content normal.  Nursing note and vitals reviewed.    ED Treatments / Results  Labs (all labs ordered are listed, but only abnormal results are displayed) Labs Reviewed - No data to display  EKG None  Radiology No results found.  Procedures EMERGENCY DEPARTMENT US SOFT TISSUE INTERPRETATION "Study: Limited Soft Tissue Ultrasound"  INDICATIONS: Soft tissue infection Multiple views of the body part were obtained in real-time with a multi-frequency linear probe  PERFORMED BY: Myself  IMAGES ARCHIVED?: No SIDE:Left BODY PART:Scalp INTERPRETATION:  Abcess present    .Marland KitchenIncision and Drainage Date/Time: 12/01/2017 11:01 AM Performed by: Amaryllis Dyke, PA-C Authorized by: Amaryllis Dyke, PA-C   Consent:    Consent obtained:  Verbal   Consent given by:  Patient   Risks discussed:  Bleeding, damage to other organs, incomplete drainage, infection and pain   Alternatives discussed:  Alternative treatment and no treatment Location:    Type:  Abscess   Size:  2.5   Location:  Head   Head location:  Scalp Pre-procedure details:    Skin preparation:  Betadine Anesthesia (see MAR for exact dosages):    Anesthesia method:  Local infiltration   Local anesthetic:  Lidocaine 2% WITH epi Procedure type:    Complexity:  Simple Procedure details:    Incision types:  Stab incision   Scalpel blade:  11   Wound management:  Probed and deloculated and irrigated with saline   Drainage:  Bloody and purulent   Drainage amount:  Moderate   Wound  treatment:  Wound left open   Packing materials:  None Post-procedure details:    Patient tolerance of procedure:  Tolerated well, no immediate complications   (including critical care time)  Medications Ordered in ED Medications - No data to display   Initial Impression / Assessment and Plan / ED Course  I have reviewed the triage vital signs and the nursing notes.  Pertinent labs & imaging results that were available during my care of the patient were reviewed by me and considered in my medical decision making (see chart for details).   Patient presents to the emergency department with abscess and mild surrounding cellulitis amenable to incision and drainage.  Patient nontoxic-appearing, no apparent distress, vitals WNL with the exception of elevated blood pressure, doubt HTN emergency, patient aware of need for recheck.  I&D per procedure note above.  Abscess was not large enough to warrant packing or drain. Given prescription for abx given surrounding cellulitis, discussed with pharmacy- recommend doxycycline with instructions for patient to discuss with whomever manages her coumadin as this may mildly affect her INR- discussed with patient and her mother- they monitor this at home closely on a weekly basis. Encouraged warm soaks. Wound recheck in 2 days. I discussed treatment plan, need for follow-up, and return precautions with the patient. Provided opportunity for questions, patient confirmed understanding and is in agreement with plan.    Final Clinical Impressions(s) / ED Diagnoses   Final diagnoses:  Abscess    ED Discharge Orders         Ordered    doxycycline (VIBRAMYCIN) 100 MG capsule  2 times daily     12/01/17 1111           Shadrick Senne, [place] R, PA-C 12/01/17 1113    Charlesetta Shanks, MD 12/01/17 1502

## 2017-12-01 NOTE — Discharge Instructions (Addendum)
You were seen in the emergency department and found to have an abscess to your scalp.  The abscess was incised and drained with release of some of the pus.  We are placing you on doxycycline and antibiotic to help complete treatment of the infection.  Please take the antibiotic as prescribed.  We have prescribed you new medication(s) today. Discuss the medications prescribed today with your pharmacist as they can have adverse effects and interactions with your other medicines including over the counter and prescribed medications. Seek medical evaluation if you start to experience new or abnormal symptoms after taking one of these medicines, seek care immediately if you start to experience difficulty breathing, feeling of your throat closing, facial swelling, or rash as these could be indications of a more serious allergic reaction  Doxycycline has the potential to alter your Coumadin levels (INR/PT).  We would like you to continue to monitor this closely at home and discuss with your Coumadin clinic.  Take Tylenol for any continued discomfort.  Please apply warm compresses for 10 to 15 minutes 4-6 times per day.  we would like you to have this area rechecked within 2 days, you may return to the ER, go to your primary care, or go to urgent care.  Return to the ER sooner for new or worsening symptoms or any other concerns.

## 2017-12-05 DIAGNOSIS — I6990 Unspecified sequelae of unspecified cerebrovascular disease: Secondary | ICD-10-CM | POA: Diagnosis not present

## 2017-12-05 DIAGNOSIS — L02811 Cutaneous abscess of head [any part, except face]: Secondary | ICD-10-CM | POA: Diagnosis not present

## 2017-12-05 DIAGNOSIS — Z23 Encounter for immunization: Secondary | ICD-10-CM | POA: Diagnosis not present

## 2017-12-05 DIAGNOSIS — E039 Hypothyroidism, unspecified: Secondary | ICD-10-CM | POA: Diagnosis not present

## 2017-12-05 DIAGNOSIS — G40909 Epilepsy, unspecified, not intractable, without status epilepticus: Secondary | ICD-10-CM | POA: Diagnosis not present

## 2017-12-05 DIAGNOSIS — K5901 Slow transit constipation: Secondary | ICD-10-CM | POA: Diagnosis not present

## 2017-12-05 DIAGNOSIS — N951 Menopausal and female climacteric states: Secondary | ICD-10-CM | POA: Diagnosis not present

## 2017-12-05 DIAGNOSIS — Z7901 Long term (current) use of anticoagulants: Secondary | ICD-10-CM | POA: Diagnosis not present

## 2017-12-05 DIAGNOSIS — G40219 Localization-related (focal) (partial) symptomatic epilepsy and epileptic syndromes with complex partial seizures, intractable, without status epilepticus: Secondary | ICD-10-CM | POA: Diagnosis not present

## 2017-12-07 ENCOUNTER — Emergency Department (HOSPITAL_COMMUNITY)
Admission: EM | Admit: 2017-12-07 | Discharge: 2017-12-07 | Disposition: A | Discharge status: Home / Self Care | Payer: Medicare Other | Attending: Emergency Medicine | Admitting: Emergency Medicine

## 2017-12-07 ENCOUNTER — Encounter (HOSPITAL_COMMUNITY): Payer: Self-pay | Admitting: *Deleted

## 2017-12-07 ENCOUNTER — Emergency Department (HOSPITAL_COMMUNITY): Payer: Medicare Other

## 2017-12-07 DIAGNOSIS — R52 Pain, unspecified: Secondary | ICD-10-CM | POA: Diagnosis not present

## 2017-12-07 DIAGNOSIS — M25562 Pain in left knee: Secondary | ICD-10-CM | POA: Diagnosis not present

## 2017-12-07 DIAGNOSIS — Z79899 Other long term (current) drug therapy: Secondary | ICD-10-CM | POA: Diagnosis not present

## 2017-12-07 DIAGNOSIS — W19XXXA Unspecified fall, initial encounter: Secondary | ICD-10-CM | POA: Diagnosis not present

## 2017-12-07 DIAGNOSIS — R609 Edema, unspecified: Secondary | ICD-10-CM | POA: Diagnosis not present

## 2017-12-07 DIAGNOSIS — J80 Acute respiratory distress syndrome: Secondary | ICD-10-CM | POA: Diagnosis not present

## 2017-12-07 DIAGNOSIS — E079 Disorder of thyroid, unspecified: Secondary | ICD-10-CM | POA: Diagnosis not present

## 2017-12-07 DIAGNOSIS — Z86718 Personal history of other venous thrombosis and embolism: Secondary | ICD-10-CM | POA: Diagnosis not present

## 2017-12-07 DIAGNOSIS — S8992XA Unspecified injury of left lower leg, initial encounter: Secondary | ICD-10-CM | POA: Diagnosis not present

## 2017-12-07 DIAGNOSIS — R61 Generalized hyperhidrosis: Secondary | ICD-10-CM | POA: Diagnosis not present

## 2017-12-07 DIAGNOSIS — M7989 Other specified soft tissue disorders: Secondary | ICD-10-CM | POA: Diagnosis not present

## 2017-12-07 MED ORDER — OXYCODONE HCL 5 MG PO TABS
5.0000 mg | ORAL_TABLET | Freq: Once | ORAL | Status: AC
  Administered 2017-12-07: 5 mg via ORAL
  Filled 2017-12-07: qty 1

## 2017-12-07 MED ORDER — LEVETIRACETAM 500 MG PO TABS
1000.0000 mg | ORAL_TABLET | Freq: Once | ORAL | Status: AC
  Administered 2017-12-07: 1000 mg via ORAL
  Filled 2017-12-07: qty 2

## 2017-12-07 MED ORDER — LAMOTRIGINE 100 MG PO TABS
200.0000 mg | ORAL_TABLET | Freq: Once | ORAL | Status: AC
  Administered 2017-12-07: 200 mg via ORAL
  Filled 2017-12-07: qty 2

## 2017-12-07 MED ORDER — PROMETHAZINE HCL 25 MG PO TABS
25.0000 mg | ORAL_TABLET | Freq: Once | ORAL | Status: AC
  Administered 2017-12-07: 25 mg via ORAL
  Filled 2017-12-07: qty 1

## 2017-12-07 NOTE — ED Provider Notes (Signed)
[facility] DEPT Provider Note   CSN: [id number] Arrival date & time: 12/07/17  0730     History   Chief Complaint Chief Complaint  Patient presents with  . Knee Pain    HPI Cassandra Andrade is a 52 y.o. female.  The history is provided by the patient and medical records. No language interpreter was used.  Knee Pain     Cassandra Andrade is a 52 y.o. 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 She did twist her knee at onset. Last night, she felt a pop and pain acutely worsened. Pain will intermittently radiate down the leg.   Past Medical History:  Diagnosis Date  . Anxiety   . Arthritis   . Cancer (HCC)    Osteosarcoma, right leg  . CVA (cerebrovascular accident due to intracerebral hemorrhage) ([place]) 2004  . Depression   . DVT (deep venous thrombosis) (HCC)    Left leg  . MRSA (methicillin resistant Staphylococcus aureus)   . 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 Seizures ([place])   . Thyroid disease   . 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 History of deep venous thrombosis 05/05/2015  . Personal history of osteosarcoma 05/05/2015  . Anemia 11/04/2014  . Abnormal finding on mammography 05/18/2014  . Major depression, recurrent ([place]) 03/19/2014  . Vision disturbance 02/28/2014  . Postprocedural state 12/31/2013  . History of biliary T-tube placement 12/31/2013  . Lupus anticoagulant disorder ([place]) 12/20/2013  . Back pain, chronic 11/07/2013  . Absence of bladder continence 08/05/2013  . History of anticoagulant therapy 07/17/2013  . Long term current use of anticoagulant therapy 07/17/2013  . Partial epilepsy with impairment of consciousness ([place]) 06/21/2013  . Intractable epilepsy ([place]) 02/28/2013  . Absolute  anemia 02/28/2013  . Seizures ([place]) 02/21/2013  . Seizure ([place]) 02/17/2013  . DVT, bilateral lower limbs ([place]) 11/07/2012  . Hypothyroid 07/04/2012  . Seizure disorder ([place]) 07/04/2012  . Chronic pain syndrome 07/04/2012  . S/P BKA (below knee amputation) ([place]) 07/04/2012  . Chronic pain associated with significant psychosocial dysfunction 07/04/2012  . Cerebrovascular accident, late effects 06/05/2012  . Status post above knee amputation 06/08/2011  . Mechanical complication of graft of bone, cartilage, muscle or tendon 05/10/2011  . Depression 05/03/2011  . 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 ABOVE KNEE LEG AMPUTATION Right    Osteosarcoma  . BRAIN SURGERY  2004   cerebral aneurysm  . FRACTURE SURGERY     Rt. femur, folllowed by MRSA  . TOTAL KNEE ARTHROPLASTY Right      OB History   None      Home Medications    Prior to Admission medications   Medication Sig Start Date End Date Taking? Authorizing Provider  acetaminophen (TYLENOL) 500 MG tablet Take 1,000 mg by mouth every 8 (eight) hours as needed for mild pain or headache.    Yes [provider]  Ascorbic Acid (VITAMIN C) 1000 MG tablet Take 1,000 mg by mouth daily.  08/17/11  Yes [provider]  Calcium Carbonate-Vitamin D 600-400 MG-UNIT per tablet Take 1 tablet by mouth daily.    Yes [provider]  diazepam (DIASAT) 20  MG GEL Place 20 mg rectally daily as needed. Patient taking differently: Place 20 mg rectally daily as needed (Seizure).  05/04/17  Yes Kathrynn Ducking, MD  ketoconazole (NIZORAL) 2 % shampoo Apply 1 application topically 2 (two) times a week.  01/21/17  Yes [provider]  lacosamide (VIMPAT) 200 MG TABS tablet Take 1 tablet (200 mg total) by mouth 2 (two) times daily. 11/10/17  Yes Kathrynn Ducking, MD  lamoTRIgine (LAMICTAL) 200 MG tablet Take 1 tablet (200 mg total) by mouth 2 (two)  times daily. 05/04/17  Yes Kathrynn Ducking, MD  lamoTRIgine (LAMICTAL) 25 MG tablet 2 TABLETS TWICE A DAY Patient taking differently: Take 50 mg by mouth 2 (two) times daily.  05/04/17  Yes Kathrynn Ducking, MD  levETIRAcetam (KEPPRA) 1000 MG tablet TAKE 2 TABLETS (2,000 MG TOTAL) BY MOUTH 2 (TWO) TIMES DAILY. Patient taking differently: Take 1,000 mg by mouth 2 (two) times daily.  07/12/17  Yes Kathrynn Ducking, MD  levothyroxine (SYNTHROID, LEVOTHROID) 50 MCG tablet Take 50 mcg by mouth daily before breakfast.   Yes [provider]  promethazine (PHENERGAN) 25 MG tablet Take 25 mg by mouth every 6 (six) hours as needed. nausea   Yes [provider]  ranitidine (ZANTAC) 150 MG tablet Take 150 mg by mouth at bedtime.  03/25/17  Yes [provider]  venlafaxine XR (EFFEXOR-XR) 150 MG 24 hr capsule TAKE 1 CAPSULE (150 MG TOTAL) BY MOUTH DAILY WITH BREAKFAST. 11/25/17  Yes Hampton Abbot, MD  warfarin (COUMADIN) 5 MG tablet Take 7.5-10 mg by mouth daily at 6 PM. 1 1/2 tab (7.5mg ) on Sunday, and 2 tablets (10mg ) on all other days OR as directed   Yes [provider]  doxycycline (VIBRAMYCIN) 100 MG capsule Take 1 capsule (100 mg total) by mouth 2 (two) times daily. Patient not taking: Reported on 12/07/2017 12/01/17   Petrucelli, Glynda Jaeger, PA-C    Family History Family History  Problem Relation Age of Onset  . Anxiety disorder Mother   . Diabetes Father     Social History Social History   Tobacco Use  . Smoking status: Never Smoker  . Smokeless tobacco: Never Used  . Tobacco comment: never used tobacco  Substance Use Topics  . Alcohol use: No    Alcohol/week: 0.0 standard drinks  . Drug use: No     Allergies   Vicodin [hydrocodone-acetaminophen]; Morphine; and Morphine and related   Review of Systems Review of Systems  Musculoskeletal: Positive for arthralgias and myalgias.  All other systems reviewed and are negative.    Physical  Exam Updated Vital Signs BP (!) 146/91 (BP Location: Left Arm)   Pulse 88   Temp 97.9 F (36.6 C) (Oral)   Resp 16   SpO2 97%   Physical Exam  Constitutional: She is oriented to person, place, and time. She appears well-developed and well-nourished. No distress.  HENT:  Head: Normocephalic and atraumatic.  Cardiovascular: Normal rate, regular rhythm and normal heart sounds.  No murmur heard. Pulmonary/Chest: Effort normal and breath sounds normal. No respiratory distress.  Musculoskeletal:  Right BKA. LLE with tenderness to lateral aspect of the knee. Normal drawers and varus/valgus testing. 2+ DP. Sensation intact. Full ROM although flexion/extension does reproduce pain. No midline C/T/L spine tenderness. No swelling or calf tenderness.  Neurological: She is alert and oriented to person, place, and time.  Skin: Skin is warm and dry.  Nursing note and vitals reviewed.    ED  Treatments / Results  Labs (all labs ordered are listed, but only abnormal results are displayed) Labs Reviewed - No data to display  EKG None  Radiology Dg Knee Complete 4 Views Left  Result Date: 12/07/2017 CLINICAL DATA:  Acute left knee pain and swelling after injury several days ago. EXAM: LEFT KNEE - COMPLETE 4+ VIEW COMPARISON:  Radiographs of August 13, 2013. FINDINGS: No evidence of fracture, dislocation, or joint effusion. Chondrocalcinosis is noted in the medial and lateral joint spaces. Mild osteophyte formation is noted laterally. Soft tissues are unremarkable. IMPRESSION: Mild degenerative changes are noted in the left knee. No acute abnormality is noted. Electronically Signed   By: Marijo Conception, M.D.   On: 12/07/2017 09:06    Procedures Procedures (including critical care time)  Medications Ordered in ED Medications  levETIRAcetam (KEPPRA) tablet 1,000 mg (1,000 mg Oral Given 12/07/17 0907)  lamoTRIgine (LAMICTAL) tablet 200 mg (200 mg Oral Given 12/07/17 0906)  promethazine  (PHENERGAN) tablet 25 mg (25 mg Oral Given 12/07/17 0904)  oxyCODONE (Oxy IR/ROXICODONE) immediate release tablet 5 mg (5 mg Oral Given 12/07/17 0904)     Initial Impression / Assessment and Plan / ED Course  I have reviewed the triage vital signs and the nursing notes.  Pertinent labs & imaging results that were available during my care of the patient were reviewed by me and considered in my medical decision making (see chart for details).    Cassandra Andrade is a 52 y.o. female who presents to ED for right knee pain after twisting mechanism injury. NVI on exam. X-ray negative. Will tx symptomatically and have her follow up with ortho. Reasons to return to ER discussed. All questions answered.    Final Clinical Impressions(s) / ED Diagnoses   Final diagnoses:  Acute pain of left knee    ED Discharge Orders    None       Ward, Ozella Almond, PA-C 12/07/17 1005    Maudie Flakes, MD 12/07/17 1551

## 2017-12-07 NOTE — ED Notes (Signed)
Bed: ZL93 Expected date:  Expected time:  Means of arrival:  Comments: EMS/knee pain

## 2017-12-07 NOTE — Discharge Instructions (Addendum)
It was my pleasure taking care of you today!   Continue Tylenol as needed for pain. You can take up to 1000mg  three times a day. Stay off of your leg until it is feeling better. Ice and elevate knee throughout the day.  Call the orthopedist listed today or tomorrow to schedule follow up appointment for recheck of ongoing knee pain in one to two weeks. That appointment can be canceled with a 24-48 hour notice if complete resolution of pain.  Return to the ER for new or worsening symptoms, any additional concerns.

## 2017-12-07 NOTE — ED Triage Notes (Signed)
Per EMS, pt complains of left knee pain and swelling. Pt twisted her left knee a couple of days ago, then heard a pop last night and states pain became worse. Pt denies other injury.

## 2017-12-21 DIAGNOSIS — Z86718 Personal history of other venous thrombosis and embolism: Secondary | ICD-10-CM | POA: Diagnosis not present

## 2017-12-31 ENCOUNTER — Encounter (HOSPITAL_COMMUNITY): Payer: Self-pay

## 2017-12-31 ENCOUNTER — Other Ambulatory Visit: Payer: Self-pay

## 2017-12-31 ENCOUNTER — Observation Stay (HOSPITAL_COMMUNITY)
Admission: EM | Admit: 2017-12-31 | Discharge: 2018-01-01 | Disposition: A | Discharge status: Home / Self Care | Payer: Medicare Other | Attending: Internal Medicine | Admitting: Internal Medicine

## 2017-12-31 DIAGNOSIS — Z89511 Acquired absence of right leg below knee: Secondary | ICD-10-CM | POA: Insufficient documentation

## 2017-12-31 DIAGNOSIS — F32A Depression, unspecified: Secondary | ICD-10-CM | POA: Diagnosis present

## 2017-12-31 DIAGNOSIS — G40901 Epilepsy, unspecified, not intractable, with status epilepticus: Secondary | ICD-10-CM | POA: Diagnosis not present

## 2017-12-31 DIAGNOSIS — D649 Anemia, unspecified: Secondary | ICD-10-CM | POA: Diagnosis not present

## 2017-12-31 DIAGNOSIS — Z86718 Personal history of other venous thrombosis and embolism: Secondary | ICD-10-CM | POA: Diagnosis not present

## 2017-12-31 DIAGNOSIS — M25462 Effusion, left knee: Secondary | ICD-10-CM | POA: Insufficient documentation

## 2017-12-31 DIAGNOSIS — Z833 Family history of diabetes mellitus: Secondary | ICD-10-CM | POA: Diagnosis not present

## 2017-12-31 DIAGNOSIS — E039 Hypothyroidism, unspecified: Secondary | ICD-10-CM | POA: Insufficient documentation

## 2017-12-31 DIAGNOSIS — I499 Cardiac arrhythmia, unspecified: Secondary | ICD-10-CM | POA: Insufficient documentation

## 2017-12-31 DIAGNOSIS — Z85830 Personal history of malignant neoplasm of bone: Secondary | ICD-10-CM | POA: Diagnosis not present

## 2017-12-31 DIAGNOSIS — G8929 Other chronic pain: Secondary | ICD-10-CM | POA: Insufficient documentation

## 2017-12-31 DIAGNOSIS — M1990 Unspecified osteoarthritis, unspecified site: Secondary | ICD-10-CM | POA: Insufficient documentation

## 2017-12-31 DIAGNOSIS — Z8673 Personal history of transient ischemic attack (TIA), and cerebral infarction without residual deficits: Secondary | ICD-10-CM | POA: Insufficient documentation

## 2017-12-31 DIAGNOSIS — Z7901 Long term (current) use of anticoagulants: Secondary | ICD-10-CM

## 2017-12-31 DIAGNOSIS — G40909 Epilepsy, unspecified, not intractable, without status epilepticus: Secondary | ICD-10-CM | POA: Diagnosis not present

## 2017-12-31 DIAGNOSIS — R0902 Hypoxemia: Secondary | ICD-10-CM | POA: Diagnosis not present

## 2017-12-31 DIAGNOSIS — Z885 Allergy status to narcotic agent status: Secondary | ICD-10-CM | POA: Insufficient documentation

## 2017-12-31 DIAGNOSIS — M329 Systemic lupus erythematosus, unspecified: Secondary | ICD-10-CM | POA: Insufficient documentation

## 2017-12-31 DIAGNOSIS — F329 Major depressive disorder, single episode, unspecified: Secondary | ICD-10-CM | POA: Diagnosis present

## 2017-12-31 DIAGNOSIS — D6862 Lupus anticoagulant syndrome: Secondary | ICD-10-CM | POA: Diagnosis not present

## 2017-12-31 DIAGNOSIS — Z79899 Other long term (current) drug therapy: Secondary | ICD-10-CM | POA: Insufficient documentation

## 2017-12-31 DIAGNOSIS — R259 Unspecified abnormal involuntary movements: Secondary | ICD-10-CM | POA: Diagnosis not present

## 2017-12-31 DIAGNOSIS — R471 Dysarthria and anarthria: Secondary | ICD-10-CM | POA: Insufficient documentation

## 2017-12-31 DIAGNOSIS — Z818 Family history of other mental and behavioral disorders: Secondary | ICD-10-CM | POA: Insufficient documentation

## 2017-12-31 DIAGNOSIS — Z923 Personal history of irradiation: Secondary | ICD-10-CM | POA: Insufficient documentation

## 2017-12-31 DIAGNOSIS — R569 Unspecified convulsions: Secondary | ICD-10-CM

## 2017-12-31 DIAGNOSIS — M549 Dorsalgia, unspecified: Secondary | ICD-10-CM | POA: Insufficient documentation

## 2017-12-31 DIAGNOSIS — F419 Anxiety disorder, unspecified: Secondary | ICD-10-CM | POA: Insufficient documentation

## 2017-12-31 DIAGNOSIS — Z89611 Acquired absence of right leg above knee: Secondary | ICD-10-CM | POA: Insufficient documentation

## 2017-12-31 DIAGNOSIS — R404 Transient alteration of awareness: Secondary | ICD-10-CM | POA: Diagnosis not present

## 2017-12-31 LAB — MRSA PCR SCREENING: MRSA BY PCR: NEGATIVE

## 2017-12-31 LAB — URINALYSIS, ROUTINE W REFLEX MICROSCOPIC
Bilirubin Urine: NEGATIVE
GLUCOSE, UA: NEGATIVE mg/dL
Ketones, ur: NEGATIVE mg/dL
Nitrite: NEGATIVE
PROTEIN: NEGATIVE mg/dL
Specific Gravity, Urine: 1.017 (ref 1.005–1.030)
pH: 5 (ref 5.0–8.0)

## 2017-12-31 LAB — COMPREHENSIVE METABOLIC PANEL
ALK PHOS: 131 U/L — AB (ref 38–126)
ALT: 31 U/L (ref 0–44)
AST: 26 U/L (ref 15–41)
Albumin: 3.8 g/dL (ref 3.5–5.0)
Anion gap: 6 (ref 5–15)
BILIRUBIN TOTAL: 0.5 mg/dL (ref 0.3–1.2)
BUN: 16 mg/dL (ref 6–20)
CALCIUM: 9.4 mg/dL (ref 8.9–10.3)
CO2: 31 mmol/L (ref 22–32)
Chloride: 103 mmol/L (ref 98–111)
Creatinine, Ser: 0.99 mg/dL (ref 0.44–1.00)
GFR calc Af Amer: >60 mL/min (ref >60)
GFR calc non Af Amer: >60 mL/min (ref >60)
GLUCOSE: 99 mg/dL (ref 70–99)
Potassium: 3.9 mmol/L (ref 3.5–5.1)
SODIUM: 140 mmol/L (ref 135–145)
TOTAL PROTEIN: 7 g/dL (ref 6.5–8.1)

## 2017-12-31 LAB — CBC WITH DIFFERENTIAL/PLATELET
ABS IMMATURE GRANULOCYTES: 0 10*3/uL (ref 0.00–0.07)
BASOS PCT: 1 %
Basophils Absolute: 0 10*3/uL (ref 0.0–0.1)
Eosinophils Absolute: 0.1 10*3/uL (ref 0.0–0.5)
Eosinophils Relative: 2 %
HCT: 36.8 % (ref 36.0–46.0)
Hemoglobin: 11.5 g/dL — ABNORMAL LOW (ref 12.0–15.0)
Immature Granulocytes: 0 %
Lymphocytes Relative: 43 %
Lymphs Abs: 1.5 10*3/uL (ref 0.7–4.0)
MCH: 28.5 pg (ref 26.0–34.0)
MCHC: 31.3 g/dL (ref 30.0–36.0)
MCV: 91.3 fL (ref 80.0–100.0)
MONO ABS: 0.4 10*3/uL (ref 0.1–1.0)
Monocytes Relative: 10 %
NEUTROS ABS: 1.5 10*3/uL — AB (ref 1.7–7.7)
Neutrophils Relative %: 44 %
PLATELETS: 204 10*3/uL (ref 150–400)
RBC: 4.03 MIL/uL (ref 3.87–5.11)
RDW: 12.9 % (ref 11.5–15.5)
WBC: 3.5 10*3/uL — ABNORMAL LOW (ref 4.0–10.5)
nRBC: 0 % (ref 0.0–0.2)

## 2017-12-31 LAB — PROTIME-INR
INR: 2.46
PROTHROMBIN TIME: 26.3 s — AB (ref 11.4–15.2)

## 2017-12-31 LAB — PHOSPHORUS: Phosphorus: 3 mg/dL (ref 2.5–4.6)

## 2017-12-31 LAB — MAGNESIUM: Magnesium: 1.6 mg/dL — ABNORMAL LOW (ref 1.7–2.4)

## 2017-12-31 MED ORDER — LAMOTRIGINE 100 MG PO TABS
200.0000 mg | ORAL_TABLET | Freq: Once | ORAL | Status: AC
  Administered 2017-12-31: 200 mg via ORAL
  Filled 2017-12-31: qty 2

## 2017-12-31 MED ORDER — LORAZEPAM 2 MG/ML IJ SOLN
2.0000 mg | INTRAMUSCULAR | Status: DC | PRN
  Administered 2017-12-31: 2 mg via INTRAVENOUS

## 2017-12-31 MED ORDER — WARFARIN SODIUM 7.5 MG PO TABS
7.5000 mg | ORAL_TABLET | Freq: Once | ORAL | Status: AC
  Administered 2017-12-31: 7.5 mg via ORAL
  Filled 2017-12-31 (×2): qty 1

## 2017-12-31 MED ORDER — LORAZEPAM 2 MG/ML IJ SOLN
1.0000 mg | Freq: Once | INTRAMUSCULAR | Status: AC
  Administered 2017-12-31: 1 mg via INTRAVENOUS
  Filled 2017-12-31: qty 1

## 2017-12-31 MED ORDER — LACOSAMIDE 50 MG PO TABS: 200.0000 mg | ORAL_TABLET | Freq: Two times a day (BID) | ORAL | Status: DC

## 2017-12-31 MED ORDER — LEVETIRACETAM IN NACL 1000 MG/100ML IV SOLN: 1000.0000 mg | Freq: Once | INTRAVENOUS | Status: DC

## 2017-12-31 MED ORDER — LAMOTRIGINE 25 MG PO TABS
250.0000 mg | ORAL_TABLET | Freq: Two times a day (BID) | ORAL | Status: DC
  Administered 2017-12-31 – 2018-01-01 (×2): 250 mg via ORAL
  Filled 2017-12-31 (×2): qty 2

## 2017-12-31 MED ORDER — SODIUM CHLORIDE 0.9 % IV SOLN
2000.0000 mg | Freq: Two times a day (BID) | INTRAVENOUS | Status: DC
  Administered 2017-12-31 – 2018-01-01 (×3): 2000 mg via INTRAVENOUS
  Filled 2017-12-31 (×5): qty 20

## 2017-12-31 MED ORDER — VENLAFAXINE HCL ER 75 MG PO CP24
150.0000 mg | ORAL_CAPSULE | Freq: Every day | ORAL | Status: DC
  Administered 2017-12-31 – 2018-01-01 (×2): 150 mg via ORAL
  Filled 2017-12-31 (×2): qty 2

## 2017-12-31 MED ORDER — LAMOTRIGINE 25 MG PO TABS: 250.0000 mg | ORAL_TABLET | Freq: Two times a day (BID) | ORAL | Status: DC

## 2017-12-31 MED ORDER — SODIUM CHLORIDE 0.9 % IV SOLN
200.0000 mg | Freq: Two times a day (BID) | INTRAVENOUS | Status: DC
  Administered 2017-12-31 – 2018-01-01 (×3): 200 mg via INTRAVENOUS
  Filled 2017-12-31 (×4): qty 20

## 2017-12-31 MED ORDER — LORAZEPAM 2 MG/ML IJ SOLN
1.0000 mg | Freq: Once | INTRAMUSCULAR | Status: DC
  Filled 2017-12-31: qty 1

## 2017-12-31 MED ORDER — LEVETIRACETAM 500 MG PO TABS
2000.0000 mg | ORAL_TABLET | Freq: Once | ORAL | Status: DC
  Filled 2017-12-31: qty 4

## 2017-12-31 MED ORDER — SODIUM CHLORIDE 0.9 % IV SOLN
1500.0000 mg | Freq: Once | INTRAVENOUS | Status: AC
  Administered 2017-12-31: 1500 mg via INTRAVENOUS
  Filled 2017-12-31: qty 30

## 2017-12-31 MED ORDER — WARFARIN - PHARMACIST DOSING INPATIENT: Freq: Every day | Status: DC

## 2017-12-31 NOTE — Progress Notes (Signed)
ANTICOAGULATION CONSULT NOTE   Pharmacy Consult for Warfarin Indication: Lupus anti-coagulant +/ hx DVT  Allergies  Allergen Reactions  . Vicodin [Hydrocodone-Acetaminophen] Nausea And Vomiting  . Morphine Rash  . Morphine And Related Rash    Allergic reaction only in iv form   Patient Measurements: Weight: 175 lb 7.8 oz (79.6 kg)  Vital Signs: Temp: 97.9 F (36.6 C) (11/09 0143) Temp Source: Oral (11/09 0143) BP: 116/75 (11/09 0800) Pulse Rate: 100 (11/09 0800)  Labs: Recent Labs    12/31/17 0613  HGB 11.5*  HCT 36.8  PLT 204  LABPROT 26.3*  INR 2.46  CREATININE 0.99   Estimated Creatinine Clearance: 73.8 mL/min (by C-G formula based on SCr of 0.99 mg/dL).  Medical History: Past Medical History:  Diagnosis Date  . Anxiety   . Arthritis   . Cancer (HCC)    Osteosarcoma, right leg  . CVA (cerebrovascular accident due to intracerebral hemorrhage) ([place]) 2004  . Depression   . DVT (deep venous thrombosis) (HCC)    Left leg  . MRSA (methicillin resistant Staphylococcus aureus)   . 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 Seizures ([place])   . Thyroid disease   . Unspecified epilepsy with intractable epilepsy 02/28/2013   Medications:  Scheduled:  . venlafaxine XR  150 mg Oral Q breakfast   Assessment:  28 yoF with witnessed seizures, on Keppra/Vimpat PTA,  Vagal nerve stimulator PMH: Seizure disorder, hx CVA, Lupus anti-coag/hx DVT on Warfarin 7.5mg  W,Su; 10mg  other days, LD 11/8 Admit INR 2.46  Goal of Therapy:  INR 2-3 Monitor platelets by anticoagulation protocol: Yes   Plan:   Warfarin 7.5mg  today at 1800  Daily Protime/INR  Monitor CBC, s/s bleeding  Minda Ditto PharmD Pager [phone] 12/31/2017, 9:33 AM

## 2017-12-31 NOTE — ED Notes (Signed)
ED TO INPATIENT HANDOFF REPORT  Name/Age/Gender Cassandra Andrade 52 y.o. female  Code Status    Code Status Orders  (From admission, onward)         Start     Ordered   12/31/17 0755  Full code  Continuous     12/31/17 0755        Code Status History    Date Active Date Inactive Code Status Order ID Comments User Context   02/21/2013 0716 02/22/2013 1848 Full Code [id number]  Merton Border, MD Inpatient   02/17/2013 1130 02/18/2013 1503 Full Code [id number]  Orvan Falconer, MD Inpatient   11/07/2012 0250 11/07/2012 2032 Full Code [id number]  Etta Quill, DO ED      Home/SNF/Other Home  Chief Complaint .  Level of Care/Admitting Diagnosis ED Disposition    ED Disposition Condition [facility] Area: [place] [[id number]]  Level of Care: Stepdown [14]  Diagnosis: Seizures [facility]) [[id number]]  Admitting Physician: Shelly Coss [[id number]]  Attending Physician: Shelly Coss [[id number]]  Estimated length of stay: past midnight tomorrow  Certification:: I certify this patient will need inpatient services for at least 2 midnights  PT Class (Do Not Modify): Inpatient [101]  PT Acc Code (Do Not Modify): Private [1]       Medical History Past Medical History:  Diagnosis Date  . Anxiety   . Arthritis   . Cancer (HCC)    Osteosarcoma, right leg  . CVA (cerebrovascular accident due to intracerebral hemorrhage) ([place]) 2004  . Depression   . DVT (deep venous thrombosis) (HCC)    Left leg  . MRSA (methicillin resistant Staphylococcus aureus)   . 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 Seizures ([place])   . Thyroid disease   . Unspecified epilepsy with intractable epilepsy 02/28/2013    Allergies Allergies  Allergen Reactions  . Vicodin [Hydrocodone-Acetaminophen] Nausea And Vomiting  . Morphine Rash  . Morphine And Related Rash    Allergic reaction only in iv form    IV Location/Drains/Wounds Patient Lines/Drains/Airways  Status   Active Line/Drains/Airways    Name:   Placement date:   Placement time:   Site:   Days:   Peripheral IV 12/31/17 Right Wrist   12/31/17    -    Wrist   less than 1          Labs/Imaging Results for orders placed or performed during the hospital encounter of 12/31/17 (from the past 48 hour(s))  Comprehensive metabolic panel     Status: Abnormal   Collection Time: 12/31/17  6:13 AM  Result Value Ref Range   Sodium 140 135 - 145 mmol/L   Potassium 3.9 3.5 - 5.1 mmol/L   Chloride 103 98 - 111 mmol/L   CO2 31 22 - 32 mmol/L   Glucose, Bld 99 70 - 99 mg/dL   BUN 16 6 - 20 mg/dL   Creatinine, Ser 0.99 0.44 - 1.00 mg/dL   Calcium 9.4 8.9 - 10.3 mg/dL   Total Protein 7.0 6.5 - 8.1 g/dL   Albumin 3.8 3.5 - 5.0 g/dL   AST 26 15 - 41 U/L   ALT 31 0 - 44 U/L   Alkaline Phosphatase 131 (H) 38 - 126 U/L   Total Bilirubin 0.5 0.3 - 1.2 mg/dL   GFR calc non Af Amer >60 >60 mL/min   GFR calc Af Amer >60 >60 mL/min    Comment: (NOTE) The  eGFR has been calculated using the CKD EPI equation. This calculation has not been validated in all clinical situations. eGFR's persistently <60 mL/min signify possible Chronic Kidney Disease.    Anion gap 6 5 - 15    Comment: Performed at [facility], [place] [address] [place], [place] [address]  CBC with Differential     Status: Abnormal   Collection Time: 12/31/17  6:13 AM  Result Value Ref Range   WBC 3.5 (L) 4.0 - 10.5 K/uL   RBC 4.03 3.87 - 5.11 MIL/uL   Hemoglobin 11.5 (L) 12.0 - 15.0 g/dL   HCT 36.8 36.0 - 46.0 %   MCV 91.3 80.0 - 100.0 fL   MCH 28.5 26.0 - 34.0 pg   MCHC 31.3 30.0 - 36.0 g/dL   RDW 12.9 11.5 - 15.5 %   Platelets 204 150 - 400 K/uL   nRBC 0.0 0.0 - 0.2 %   Neutrophils Relative % 44 %   Neutro Abs 1.5 (L) 1.7 - 7.7 K/uL   Lymphocytes Relative 43 %   Lymphs Abs 1.5 0.7 - 4.0 K/uL   Monocytes Relative 10 %   Monocytes Absolute 0.4 0.1 - 1.0 K/uL   Eosinophils Relative 2 %   Eosinophils Absolute  0.1 0.0 - 0.5 K/uL   Basophils Relative 1 %   Basophils Absolute 0.0 0.0 - 0.1 K/uL   Immature Granulocytes 0 %   Abs Immature Granulocytes 0.00 0.00 - 0.07 K/uL    Comment: Performed at [facility], [place] [address], [place], [place] [address]  Protime-INR     Status: Abnormal   Collection Time: 12/31/17  6:13 AM  Result Value Ref Range   Prothrombin Time 26.3 (H) 11.4 - 15.2 seconds   INR 2.46     Comment: Performed at [facility], [place] [address], [place], [place] [address]  Urinalysis, Routine w reflex microscopic     Status: Abnormal   Collection Time: 12/31/17  6:21 AM  Result Value Ref Range   Color, Urine YELLOW YELLOW   APPearance CLEAR CLEAR   Specific Gravity, Urine 1.017 1.005 - 1.030   pH 5.0 5.0 - 8.0   Glucose, UA NEGATIVE NEGATIVE mg/dL   Hgb urine dipstick MODERATE (A) NEGATIVE   Bilirubin Urine NEGATIVE NEGATIVE   Ketones, ur NEGATIVE NEGATIVE mg/dL   Protein, ur NEGATIVE NEGATIVE mg/dL   Nitrite NEGATIVE NEGATIVE   Leukocytes, UA TRACE (A) NEGATIVE   RBC / HPF 11-20 0 - 5 RBC/hpf   WBC, UA 0-5 0 - 5 WBC/hpf   Bacteria, UA RARE (A) NONE SEEN   Squamous Epithelial / LPF 0-5 0 - 5    Comment: Performed at [facility], [place] [address] [place], [place] [address]   No results found. None  Pending Labs Unresulted Labs (From admission, onward)    Start     Ordered   01/01/18 6578  Basic metabolic panel  Tomorrow morning,   R     12/31/17 0755   01/01/18 0500  Comprehensive metabolic panel  Tomorrow morning,   R     12/31/17 0755   01/01/18 0500  Protime-INR  Daily,   R     12/31/17 0927   12/31/17 0753  HIV antibody (Routine Testing)  Once,   R     12/31/17 0755          Vitals/Pain Today's Vitals   12/31/17 1030 12/31/17 1045 12/31/17 1100 12/31/17 1130  BP:  117/76  111/72 127/61  Pulse: 92 90 88 94  Resp:    16  Temp:      TempSrc:      SpO2: 97% 97% 98% 100%  Weight:      PainSc:         Isolation Precautions No active isolations  Medications Medications  venlafaxine XR (EFFEXOR-XR) 24 hr capsule 150 mg (150 mg Oral Given 12/31/17 0802)  LORazepam (ATIVAN) injection 2 mg (2 mg Intravenous Given 12/31/17 0833)  levETIRAcetam (KEPPRA) 2,000 mg in sodium chloride 0.9 % 100 mL IVPB (2,000 mg Intravenous New Bag/Given 12/31/17 0817)  lacosamide (VIMPAT) 200 mg in sodium chloride 0.9 % 25 mL IVPB (200 mg Intravenous New Bag/Given 12/31/17 1008)  warfarin (COUMADIN) tablet 7.5 mg (has no administration in time range)  Warfarin - Pharmacist Dosing Inpatient (has no administration in time range)  LORazepam (ATIVAN) injection 1 mg (1 mg Intravenous Given 12/31/17 0616)  lamoTRIgine (LAMICTAL) tablet 200 mg (200 mg Oral Given 12/31/17 0746)  fosPHENYtoin (CEREBYX) 1,500 mg PE in sodium chloride 0.9 % 50 mL IVPB (0 mg PE Intravenous Stopped 12/31/17 0817)    Mobility walks with device

## 2017-12-31 NOTE — ED Notes (Signed)
Pt's family is upset because they have not seen a doctor yet. Rn explained delay

## 2017-12-31 NOTE — ED Notes (Signed)
Bed: EK35 Expected date:  Expected time:  Means of arrival:  Comments: EMS 52 yo female seizure/Versed 5 mg IM

## 2017-12-31 NOTE — H&P (Signed)
History and Physical    Cassandra Andrade UKG:[id number] DOB: 1965/10/19 DOA: 12/31/2017  PCP: Berkley Harvey, NP   Patient coming from: Home  Chief Complaint: Seizures  HPI: Cassandra Andrade is a 52 y.o. female with medical history significant of seizure disorder, status epilepticus, stroke, lupus anticoagulant on warfarin who presents to the emergency department with complaints of seizure episodes at home.  It was not described as tonic-clonic seizures.  Patient was reported to be alert and oriented during these episodes and was not postictal.  She felt jerky movements on on her upper extremities but more on the right. Patient also has a vagal nerve stimulator. Patient also takes rectal diazepam as needed for seizures but did not do that much of  benefit.  She denied any urinary or fecal incontinence, tongue bite.  Patient states she has been compliant with her medications.She follows at [facility] neurology. Patient seen and examined the bedside in the emergency department. She is hemodynamically stable.  She was found to be alert and oriented .She was reported to have jerking movements of her right upper extremity in the emergency department also  I did not see any during my evaluation. She denied any chest pain, shortness of breath no palpitations, abdominal pain, dysuria, nausea, vomiting, fever, chills, diarrhea, headache or blurry vision.  ED Course: Given Ativan for seizures.  Neurology consulted.  Loaded with Keppra, Vimpat and fosphenytoin.  Admission planned to [facility]    Past Medical History:  Diagnosis Date  . Anxiety   . Arthritis   . Cancer (HCC)    Osteosarcoma, right leg  . CVA (cerebrovascular accident due to intracerebral hemorrhage) ([place]) 2004  . Depression   . DVT (deep venous thrombosis) (HCC)    Left leg  . MRSA (methicillin resistant Staphylococcus aureus)   . 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 Seizures ([place])   . Thyroid disease     . 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 ABOVE KNEE LEG AMPUTATION Right    Osteosarcoma  . BRAIN SURGERY  2004   cerebral aneurysm  . FRACTURE SURGERY     Rt. femur, folllowed by MRSA  . TOTAL KNEE ARTHROPLASTY Right      reports that she has never smoked. She has never used smokeless tobacco. She reports that she does not drink alcohol or use drugs.  Allergies  Allergen Reactions  . Vicodin [Hydrocodone-Acetaminophen] Nausea And Vomiting  . Morphine Rash  . Morphine And Related Rash    Allergic reaction only in iv form    Family History  Problem Relation Age of Onset  . Anxiety disorder Mother   . Diabetes Father      Prior to Admission medications   Medication Sig Start Date End Date Taking? Authorizing Provider  acetaminophen (TYLENOL) 500 MG tablet Take 1,000 mg by mouth every 8 (eight) hours as needed for mild pain or headache.    Yes [provider]  Ascorbic Acid (VITAMIN C) 1000 MG tablet Take 1,000 mg by mouth daily.  08/17/11  Yes [provider]  Cholecalciferol (VITAMIN D) 50 MCG (2000 UT) CAPS Take 1 capsule by mouth daily.   Yes [provider]  diazepam (DIASAT) 20 MG GEL Place 20 mg rectally daily as needed. Patient taking differently: Place 20 mg rectally daily as needed (Seizure).  05/04/17  Yes Kathrynn Ducking, MD  ketoconazole ([place])  2 % shampoo Apply 1 application topically 2 (two) times a week.  01/21/17  Yes [provider]  lacosamide (VIMPAT) 200 MG TABS tablet Take 1 tablet (200 mg total) by mouth 2 (two) times daily. 11/10/17  Yes Kathrynn Ducking, MD  lamoTRIgine (LAMICTAL) 200 MG tablet Take 1 tablet (200 mg total) by mouth 2 (two) times daily. 05/04/17  Yes Kathrynn Ducking, MD  lamoTRIgine (LAMICTAL) 25 MG tablet 2 TABLETS TWICE A DAY Patient taking differently: Take 50 mg by mouth 2 (two) times daily.  05/04/17  Yes Kathrynn Ducking, MD  levETIRAcetam (KEPPRA) 1000 MG tablet TAKE 2 TABLETS (2,000 MG TOTAL) BY MOUTH 2 (TWO) TIMES DAILY. Patient taking differently: Take 2,000 mg by mouth 2 (two) times daily.  07/12/17  Yes Kathrynn Ducking, MD  levothyroxine (SYNTHROID, LEVOTHROID) 50 MCG tablet Take 50 mcg by mouth daily before breakfast.   Yes [provider]  Prenatal Vit-Fe Fumarate-FA (PRENATAL MULTIVITAMIN) TABS tablet Take 1 tablet by mouth daily at 12 noon.   Yes [provider]  promethazine (PHENERGAN) 25 MG tablet Take 25 mg by mouth every 6 (six) hours as needed. nausea   Yes [provider]  ranitidine (ZANTAC) 150 MG tablet Take 150 mg by mouth at bedtime.  03/25/17  Yes [provider]  venlafaxine XR (EFFEXOR-XR) 150 MG 24 hr capsule TAKE 1 CAPSULE (150 MG TOTAL) BY MOUTH DAILY WITH BREAKFAST. 11/25/17  Yes Hampton Abbot, MD  warfarin (COUMADIN) 5 MG tablet Take 7.5-10 mg by mouth See admin instructions. Pt takes one and a half tablets (7.5mg  total) on Sunday and Wednesday and 2 tablets (10mg  total) all other days.   Yes [provider]  doxycycline (VIBRAMYCIN) 100 MG capsule Take 1 capsule (100 mg total) by mouth 2 (two) times daily. Patient not taking: Reported on 12/07/2017 12/01/17   Amaryllis Dyke, PA-C    Physical Exam: Vitals:   12/31/17 0530 12/31/17 0600 12/31/17 0630 12/31/17 0700  BP: 125/74 119/70 113/65 117/70  Pulse: 78 87 90 91  Resp: 14 (!) 23 (!) 31 18  Temp:      TempSrc:      SpO2: 99% 100% 97% 97%  Weight:        Constitutional: NAD, calm, comfortable Vitals:   12/31/17 0530 12/31/17 0600 12/31/17 0630 12/31/17 0700  BP: 125/74 119/70 113/65 117/70  Pulse: 78 87 90 91  Resp: 14 (!) 23 (!) 31 18  Temp:      TempSrc:      SpO2: 99% 100% 97% 97%  Weight:       Eyes: PERRL, lids and conjunctivae normal ENMT: Mucous membranes are moist. Posterior pharynx clear of any exudate or lesions.Normal dentition.  Neck: normal, supple,  no masses, no thyromegaly Respiratory: clear to auscultation bilaterally, no wheezing, no crackles. Normal respiratory effort. No accessory muscle use.  Cardiovascular: Regular rate and rhythm, no murmurs / rubs / gallops. No extremity edema. 2+ pedal pulses. No carotid bruits.  Abdomen: no tenderness, no masses palpated. No hepatosplenomegaly. Bowel sounds positive.  Musculoskeletal: no clubbing / cyanosis. No joint deformity upper and lower extremities. Good ROM, no contractures. Normal muscle tone.  Right BKA Skin: no rashes, lesions, ulcers. No induration Neurologic: CN 2-12 grossly intact. Sensation intact, DTR normal. Strength 5/5 in all 4.  Psychiatric: Normal judgment and insight. Alert and oriented x 3. Normal mood.   Foley Catheter:None  Labs on Admission: I have personally reviewed following labs and  imaging studies  CBC: Recent Labs  Lab 12/31/17 0613  WBC 3.5*  NEUTROABS 1.5*  HGB 11.5*  HCT 36.8  MCV 91.3  PLT 160   Basic Metabolic Panel: Recent Labs  Lab 12/31/17 0613  NA 140  K 3.9  CL 103  CO2 31  GLUCOSE 99  BUN 16  CREATININE 0.99  CALCIUM 9.4   GFR: Estimated Creatinine Clearance: 73.8 mL/min (by C-G formula based on SCr of 0.99 mg/dL). Liver Function Tests: Recent Labs  Lab 12/31/17 0613  AST 26  ALT 31  ALKPHOS 131*  BILITOT 0.5  PROT 7.0  ALBUMIN 3.8   No results for input(s): LIPASE, AMYLASE in the last 168 hours. No results for input(s): AMMONIA in the last 168 hours. Coagulation Profile: Recent Labs  Lab 12/31/17 0613  INR 2.46   Cardiac Enzymes: No results for input(s): CKTOTAL, CKMB, CKMBINDEX, TROPONINI in the last 168 hours. BNP (last 3 results) No results for input(s): PROBNP in the last 8760 hours. HbA1C: No results for input(s): HGBA1C in the last 72 hours. CBG: No results for input(s): GLUCAP in the last 168 hours. Lipid Profile: No results for input(s): CHOL, HDL, LDLCALC, TRIG, CHOLHDL, LDLDIRECT in the last 72  hours. Thyroid Function Tests: No results for input(s): TSH, T4TOTAL, FREET4, T3FREE, THYROIDAB in the last 72 hours. Anemia Panel: No results for input(s): VITAMINB12, FOLATE, FERRITIN, TIBC, IRON, RETICCTPCT in the last 72 hours. Urine analysis:    Component Value Date/Time   COLORURINE YELLOW 12/31/2017 0621   APPEARANCEUR CLEAR 12/31/2017 0621   LABSPEC 1.017 12/31/2017 0621   PHURINE 5.0 12/31/2017 0621   GLUCOSEU NEGATIVE 12/31/2017 0621   HGBUR MODERATE (A) 12/31/2017 0621   BILIRUBINUR NEGATIVE 12/31/2017 0621   KETONESUR NEGATIVE 12/31/2017 0621   PROTEINUR NEGATIVE 12/31/2017 0621   UROBILINOGEN 0.2 05/05/2013 1110   NITRITE NEGATIVE 12/31/2017 0621   LEUKOCYTESUR TRACE (A) 12/31/2017 0621    Radiological Exams on Admission: No results found.   Assessment/Plan Principal Problem:   Seizure disorder [facility]) Active Problems:   Depression   Hypothyroid   Lupus anticoagulant disorder ([place])   Long term current use of anticoagulant therapy  Seizure disorder: Noted to have seizures at home ,nonconvulsive. Jerky movements of her right upper extremity. There was concern for focal non-tonic-clonic status epilepticus in the emergency department. Neurology consulted.  Patient will be transferred to [facility].  Continue Keppra IV, Vimpat IV, oral Lamictal.  Also given 1 IV dose of fosphenytoin.  Neurology will follow.  Continue Ativan for breakthrough seizures.  History of bilateral DVT/lupus anticoagulant: Follows with hematology as an outpatient.  Continue Coumadin.  Depression: Continue venlafaxine  Hypothyroidism: Continue Synthyroid  History of osteosarcoma of right leg: Status post radiation/chemotherapy/surgery.  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  Severity of Illness: The appropriate patient status for this patient is INPATIENT.  DVT prophylaxis: Coumadin Code Status: Full Family Communication: None present at the  bedside Consults called: Neurology     Shelly Coss MD [facility] Pager [phone]  If 7PM-7AM, please contact night-coverage [email or web address] Password [facility]  12/31/2017, 7:38 AM

## 2017-12-31 NOTE — ED Triage Notes (Addendum)
Per ems: pt coming from home c/o witnessed focal seizure that lasted 5 minutes and had involuntary movement of right arm afterward. Extensive hx of seizures. A&Ox2 currently   5mg  versed given by EMS  20 G right hand, NSR

## 2017-12-31 NOTE — ED Provider Notes (Signed)
[facility] DEPT Provider Note   CSN: [id number] Arrival date & time: 12/31/17  0139     History   Chief Complaint Chief Complaint  Patient presents with  . Seizures    HPI Cassandra Andrade is a 52 y.o. female.  The history is provided by the patient and a relative.  She has history of seizure disorder, stroke, lupus anticoagulant and comes in after having had 2 seizures at home.  Family has difficulty describing the seizures, but she is awake during them.  She has a vagal nerve stimulator and did feel that a seizure was coming on, but she was unable to use her stimulator.  She was given rectal diazepam without any improvement.  She denies urinary or fecal incontinence, denies bit lip or tongue.  She has been compliant with her medications.  Past Medical History:  Diagnosis Date  . Anxiety   . Arthritis   . Cancer (HCC)    Osteosarcoma, right leg  . CVA (cerebrovascular accident due to intracerebral hemorrhage) ([place]) 2004  . Depression   . DVT (deep venous thrombosis) (HCC)    Left leg  . MRSA (methicillin resistant Staphylococcus aureus)   . 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 Seizures ([place])   . Thyroid disease   . 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 History of deep venous thrombosis 05/05/2015  . Personal history of osteosarcoma 05/05/2015  . Anemia 11/04/2014  . Abnormal finding on mammography 05/18/2014  . Major depression, recurrent ([place]) 03/19/2014  . Vision disturbance 02/28/2014  . Postprocedural state 12/31/2013  . History of biliary T-tube placement 12/31/2013  . Lupus anticoagulant disorder ([place]) 12/20/2013  . Back pain, chronic 11/07/2013  . Absence of bladder continence 08/05/2013  . History of anticoagulant therapy 07/17/2013  . Long term current use of anticoagulant therapy 07/17/2013  . Partial epilepsy with impairment  of consciousness ([place]) 06/21/2013  . Intractable epilepsy ([place]) 02/28/2013  . Absolute anemia 02/28/2013  . Seizures ([place]) 02/21/2013  . Seizure ([place]) 02/17/2013  . DVT, bilateral lower limbs ([place]) 11/07/2012  . Hypothyroid 07/04/2012  . Seizure disorder ([place]) 07/04/2012  . Chronic pain syndrome 07/04/2012  . S/P BKA (below knee amputation) ([place]) 07/04/2012  . Chronic pain associated with significant psychosocial dysfunction 07/04/2012  . Cerebrovascular accident, late effects 06/05/2012  . Status post above knee amputation 06/08/2011  . Mechanical complication of graft of bone, cartilage, muscle or tendon 05/10/2011  . Depression 05/03/2011  . 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 ABOVE KNEE LEG AMPUTATION Right    Osteosarcoma  . BRAIN SURGERY  2004   cerebral aneurysm  . FRACTURE SURGERY     Rt. femur, folllowed by MRSA  . TOTAL KNEE ARTHROPLASTY Right      OB History   None      Home Medications    Prior to Admission medications   Medication Sig Start Date End Date Taking? Authorizing Provider  acetaminophen (TYLENOL) 500 MG tablet Take 1,000 mg by mouth every 8 (eight) hours as needed for mild pain or headache.    Yes [provider]  Ascorbic Acid (VITAMIN C) 1000 MG tablet Take 1,000 mg by mouth daily.  08/17/11  Yes [provider]  Cholecalciferol (VITAMIN D) 50 MCG (2000 UT) CAPS Take 1 capsule by mouth daily.  Yes [provider]  diazepam (DIASAT) 20 MG GEL Place 20 mg rectally daily as needed. Patient taking differently: Place 20 mg rectally daily as needed (Seizure).  05/04/17  Yes Kathrynn Ducking, MD  ketoconazole (NIZORAL) 2 % shampoo Apply 1 application topically 2 (two) times a week.  01/21/17  Yes [provider]  lacosamide (VIMPAT) 200 MG TABS tablet Take 1 tablet (200 mg total) by mouth 2 (two) times daily. 11/10/17  Yes Kathrynn Ducking, MD    lamoTRIgine (LAMICTAL) 200 MG tablet Take 1 tablet (200 mg total) by mouth 2 (two) times daily. 05/04/17  Yes Kathrynn Ducking, MD  lamoTRIgine (LAMICTAL) 25 MG tablet 2 TABLETS TWICE A DAY Patient taking differently: Take 50 mg by mouth 2 (two) times daily.  05/04/17  Yes Kathrynn Ducking, MD  levETIRAcetam (KEPPRA) 1000 MG tablet TAKE 2 TABLETS (2,000 MG TOTAL) BY MOUTH 2 (TWO) TIMES DAILY. Patient taking differently: Take 2,000 mg by mouth 2 (two) times daily.  07/12/17  Yes Kathrynn Ducking, MD  levothyroxine (SYNTHROID, LEVOTHROID) 50 MCG tablet Take 50 mcg by mouth daily before breakfast.   Yes [provider]  Prenatal Vit-Fe Fumarate-FA (PRENATAL MULTIVITAMIN) TABS tablet Take 1 tablet by mouth daily at 12 noon.   Yes [provider]  promethazine (PHENERGAN) 25 MG tablet Take 25 mg by mouth every 6 (six) hours as needed. nausea   Yes [provider]  ranitidine (ZANTAC) 150 MG tablet Take 150 mg by mouth at bedtime.  03/25/17  Yes [provider]  venlafaxine XR (EFFEXOR-XR) 150 MG 24 hr capsule TAKE 1 CAPSULE (150 MG TOTAL) BY MOUTH DAILY WITH BREAKFAST. 11/25/17  Yes Hampton Abbot, MD  warfarin (COUMADIN) 5 MG tablet Take 7.5-10 mg by mouth See admin instructions. Pt takes one and a half tablets (7.5mg  total) on Sunday and Wednesday and 2 tablets (10mg  total) all other days.   Yes [provider]  doxycycline (VIBRAMYCIN) 100 MG capsule Take 1 capsule (100 mg total) by mouth 2 (two) times daily. Patient not taking: Reported on 12/07/2017 12/01/17   Petrucelli, Glynda Jaeger, PA-C    Family History Family History  Problem Relation Age of Onset  . Anxiety disorder Mother   . Diabetes Father     Social History Social History   Tobacco Use  . Smoking status: Never Smoker  . Smokeless tobacco: Never Used  . Tobacco comment: never used tobacco  Substance Use Topics  . Alcohol use: No    Alcohol/week: 0.0 standard drinks  . Drug use: No      Allergies   Vicodin [hydrocodone-acetaminophen]; Morphine; and Morphine and related   Review of Systems Review of Systems  All other systems reviewed and are negative.    Physical Exam Updated Vital Signs BP 124/77   Pulse 84   Temp 97.9 F (36.6 C) (Oral)   Resp 12   Wt 79.6 kg   SpO2 100%   BMI 27.08 kg/m   Physical Exam  Nursing note and vitals reviewed.  52 year old female, resting comfortably and in no acute distress. Vital signs are normal. Oxygen saturation is 100%, which is normal. Head is normocephalic and atraumatic. PERRLA, EOMI. Oropharynx is clear. Neck is nontender and supple without adenopathy or JVD. Back is nontender and there is no CVA tenderness. Lungs are clear without rales, wheezes, or rhonchi. Chest is nontender. Heart has regular rate and rhythm without murmur. Abdomen is soft, flat, nontender without masses or  hepatosplenomegaly and peristalsis is normoactive. Extremities: Right above-the-knee amputation. Skin is warm and dry without rash. Neurologic: Mental status is normal, cranial nerves are intact, there are no motor or sensory deficits.  Mild twitching of the right upper extremity suggestive of focal seizure  ED Treatments / Results  Labs (all labs ordered are listed, but only abnormal results are displayed) Labs Reviewed  COMPREHENSIVE METABOLIC PANEL - Abnormal; Notable for the following components:      Result Value   Alkaline Phosphatase 131 (*)    All other components within normal limits  CBC WITH DIFFERENTIAL/PLATELET - Abnormal; Notable for the following components:   WBC 3.5 (*)    Hemoglobin 11.5 (*)    Neutro Abs 1.5 (*)    All other components within normal limits  URINALYSIS, ROUTINE W REFLEX MICROSCOPIC - Abnormal; Notable for the following components:   Hgb urine dipstick MODERATE (*)    Leukocytes, UA TRACE (*)    Bacteria, UA RARE (*)    All other components within normal limits  PROTIME-INR - Abnormal;  Notable for the following components:   Prothrombin Time 26.3 (*)    All other components within normal limits   Procedures Procedures  CRITICAL CARE Performed by: Delora Fuel Total critical care time: 50 minutes Critical care time was exclusive of separately billable procedures and treating other patients. Critical care was necessary to treat or prevent imminent or life-threatening deterioration. Critical care was time spent personally by me on the following activities: development of treatment plan with patient and/or surrogate as well as nursing, discussions with consultants, evaluation of patient's response to treatment, examination of patient, obtaining history from patient or surrogate, ordering and performing treatments and interventions, ordering and review of laboratory studies, ordering and review of radiographic studies, pulse oximetry and re-evaluation of patient's condition.  Medications Ordered in ED Medications - No data to display   Initial Impression / Assessment and Plan / ED Course  I have reviewed the triage vital signs and the nursing notes.  Pertinent labs & imaging results that were available during my care of the patient were reviewed by me and considered in my medical decision making (see chart for details).  Seizures and patient with known history of epilepsy.  Old records are reviewed, and she has had several admissions for status epilepticus.  I am concerned about patient having had 2 seizures so close together and I am suspicious that she might be continuing to seize.  Screening labs are obtained and she is given a dose of lorazepam.  Following lorazepam, she continues to have seizure.  Screening labs are unremarkable.  There is a mild anemia and leukopenia which are unchanged from baseline.  I have discussed the case with Dr. Rory Percy of neurology service to request patient received a loading dose of fosphenytoin and that she be transferred to [facility]  where he can arrange to have her on continuous EEG monitoring.  He requests she be admitted to hospitalist service.  Case is discussed with Dr. Tawanna Solo of [facility], who agrees to admit the patient.  Final Clinical Impressions(s) / ED Diagnoses   Final diagnoses:  Status epilepticus ([place])  Normochromic normocytic anemia    ED Discharge Orders    None       Delora Fuel, MD 68/12/75 [phone]

## 2017-12-31 NOTE — ED Notes (Signed)
Pt placed on purewick 

## 2017-12-31 NOTE — ED Notes (Signed)
ED TO INPATIENT HANDOFF REPORT  Name/Age/Gender Cassandra Andrade 52 y.o. female  Code Status    Code Status Orders  (From admission, onward)         Start     Ordered   12/31/17 0755  Full code  Continuous     12/31/17 0755        Code Status History    Date Active Date Inactive Code Status Order ID Comments User Context   02/21/2013 0716 02/22/2013 1848 Full Code [id number]  Merton Border, MD Inpatient   02/17/2013 1130 02/18/2013 1503 Full Code [id number]  Orvan Falconer, MD Inpatient   11/07/2012 0250 11/07/2012 2032 Full Code [id number]  Etta Quill, DO ED      Home/SNF/Other Home  Chief Complaint .  Level of Care/Admitting Diagnosis ED Disposition    ED Disposition Condition [facility] Area: [place] [[id number]]  Level of Care: Stepdown [14]  Diagnosis: Seizures [facility]) [[id number]]  Admitting Physician: Shelly Coss [[id number]]  Attending Physician: Shelly Coss [[id number]]  Estimated length of stay: past midnight tomorrow  Certification:: I certify this patient will need inpatient services for at least 2 midnights  PT Class (Do Not Modify): Inpatient [101]  PT Acc Code (Do Not Modify): Private [1]       Medical History Past Medical History:  Diagnosis Date  . Anxiety   . Arthritis   . Cancer (HCC)    Osteosarcoma, right leg  . CVA (cerebrovascular accident due to intracerebral hemorrhage) ([place]) 2004  . Depression   . DVT (deep venous thrombosis) (HCC)    Left leg  . MRSA (methicillin resistant Staphylococcus aureus)   . 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 Seizures ([place])   . Thyroid disease   . Unspecified epilepsy with intractable epilepsy 02/28/2013    Allergies Allergies  Allergen Reactions  . Vicodin [Hydrocodone-Acetaminophen] Nausea And Vomiting  . Morphine Rash  . Morphine And Related Rash    Allergic reaction only in iv form    IV Location/Drains/Wounds Patient Lines/Drains/Airways  Status   Active Line/Drains/Airways    Name:   Placement date:   Placement time:   Site:   Days:   Peripheral IV 12/31/17 Right Wrist   12/31/17    -    Wrist   less than 1          Labs/Imaging Results for orders placed or performed during the hospital encounter of 12/31/17 (from the past 48 hour(s))  Comprehensive metabolic panel     Status: Abnormal   Collection Time: 12/31/17  6:13 AM  Result Value Ref Range   Sodium 140 135 - 145 mmol/L   Potassium 3.9 3.5 - 5.1 mmol/L   Chloride 103 98 - 111 mmol/L   CO2 31 22 - 32 mmol/L   Glucose, Bld 99 70 - 99 mg/dL   BUN 16 6 - 20 mg/dL   Creatinine, Ser 0.99 0.44 - 1.00 mg/dL   Calcium 9.4 8.9 - 10.3 mg/dL   Total Protein 7.0 6.5 - 8.1 g/dL   Albumin 3.8 3.5 - 5.0 g/dL   AST 26 15 - 41 U/L   ALT 31 0 - 44 U/L   Alkaline Phosphatase 131 (H) 38 - 126 U/L   Total Bilirubin 0.5 0.3 - 1.2 mg/dL   GFR calc non Af Amer >60 >60 mL/min   GFR calc Af Amer >60 >60 mL/min    Comment: (NOTE) The  eGFR has been calculated using the CKD EPI equation. This calculation has not been validated in all clinical situations. eGFR's persistently <60 mL/min signify possible Chronic Kidney Disease.    Anion gap 6 5 - 15    Comment: Performed at [facility], [place] [address], [place], [place] [address]  CBC with Differential     Status: Abnormal   Collection Time: 12/31/17  6:13 AM  Result Value Ref Range   WBC 3.5 (L) 4.0 - 10.5 K/uL   RBC 4.03 3.87 - 5.11 MIL/uL   Hemoglobin 11.5 (L) 12.0 - 15.0 g/dL   HCT 36.8 36.0 - 46.0 %   MCV 91.3 80.0 - 100.0 fL   MCH 28.5 26.0 - 34.0 pg   MCHC 31.3 30.0 - 36.0 g/dL   RDW 12.9 11.5 - 15.5 %   Platelets 204 150 - 400 K/uL   nRBC 0.0 0.0 - 0.2 %   Neutrophils Relative % 44 %   Neutro Abs 1.5 (L) 1.7 - 7.7 K/uL   Lymphocytes Relative 43 %   Lymphs Abs 1.5 0.7 - 4.0 K/uL   Monocytes Relative 10 %   Monocytes Absolute 0.4 0.1 - 1.0 K/uL   Eosinophils Relative 2 %   Eosinophils Absolute  0.1 0.0 - 0.5 K/uL   Basophils Relative 1 %   Basophils Absolute 0.0 0.0 - 0.1 K/uL   Immature Granulocytes 0 %   Abs Immature Granulocytes 0.00 0.00 - 0.07 K/uL    Comment: Performed at [facility], [place] [address], [place], [place] [address]  Protime-INR     Status: Abnormal   Collection Time: 12/31/17  6:13 AM  Result Value Ref Range   Prothrombin Time 26.3 (H) 11.4 - 15.2 seconds   INR 2.46     Comment: Performed at [facility], [place] [address] [place], [place] [address]  Urinalysis, Routine w reflex microscopic     Status: Abnormal   Collection Time: 12/31/17  6:21 AM  Result Value Ref Range   Color, Urine YELLOW YELLOW   APPearance CLEAR CLEAR   Specific Gravity, Urine 1.017 1.005 - 1.030   pH 5.0 5.0 - 8.0   Glucose, UA NEGATIVE NEGATIVE mg/dL   Hgb urine dipstick MODERATE (A) NEGATIVE   Bilirubin Urine NEGATIVE NEGATIVE   Ketones, ur NEGATIVE NEGATIVE mg/dL   Protein, ur NEGATIVE NEGATIVE mg/dL   Nitrite NEGATIVE NEGATIVE   Leukocytes, UA TRACE (A) NEGATIVE   RBC / HPF 11-20 0 - 5 RBC/hpf   WBC, UA 0-5 0 - 5 WBC/hpf   Bacteria, UA RARE (A) NONE SEEN   Squamous Epithelial / LPF 0-5 0 - 5    Comment: Performed at [facility], [place] [address], [place], [place] [address]   No results found. None  Pending Labs Unresulted Labs (From admission, onward)    Start     Ordered   01/01/18 3570  Basic metabolic panel  Tomorrow morning,   R     12/31/17 0755   01/01/18 0500  Comprehensive metabolic panel  Tomorrow morning,   R     12/31/17 0755   12/31/17 0753  HIV antibody (Routine Testing)  Once,   R     12/31/17 0755          Vitals/Pain Today's Vitals   12/31/17 0700 12/31/17 0730 12/31/17 0746 12/31/17 0800  BP: 117/70 114/72  116/75  Pulse: 91 95  100  Resp: 18 17  15  Temp:      TempSrc:      SpO2: 97% 100%  99%  Weight:      PainSc:   0-No pain     Isolation Precautions No active  isolations  Medications Medications  venlafaxine XR (EFFEXOR-XR) 24 hr capsule 150 mg (150 mg Oral Given 12/31/17 0802)  LORazepam (ATIVAN) injection 2 mg (has no administration in time range)  fosPHENYtoin (CEREBYX) 1,500 mg PE in sodium chloride 0.9 % 50 mL IVPB (1,500 mg PE Intravenous New Bag/Given 12/31/17 0801)  levETIRAcetam (KEPPRA) 2,000 mg in sodium chloride 0.9 % 100 mL IVPB (has no administration in time range)  lacosamide (VIMPAT) 200 mg in sodium chloride 0.9 % 25 mL IVPB (has no administration in time range)  LORazepam (ATIVAN) injection 1 mg (1 mg Intravenous Given 12/31/17 0616)  lamoTRIgine (LAMICTAL) tablet 200 mg (200 mg Oral Given 12/31/17 0746)    Mobility walks with device

## 2017-12-31 NOTE — Consult Note (Addendum)
NEURO HOSPITALIST CONSULT NOTE   Requesting Physician: Dr. Tawanna Solo    Chief Complaint: seizures  History obtained from:  Chart  HPI:                                                                                                                                         Cassandra Andrade is an 52 y.o. female  with PMH significant for seizures, BKA right, DVT, ( coumadin), lupus, CVA (right 2004), Osteosarcoma ( right leg),  Presents to [facility] ED with seizure lasting 5 minutes.  Per EMS/chart review: patient has extensive seizure history. This seizure lasted 5 minutes. Versed 5 mg given in route to ED. Has a vagal nerve stimulator. She was given rectal diazepam w/o improvement and was unable to use her vagal nerve stimulator. Denies any urine or fecal incontinence. No family present at bedside.   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   left homonymous field deficit and mild left hemiparesis.  Seizure history : Patient is a patient of Dr. Margette Fast. He last saw her on 11/10/17. Patient at that time was having seizures daily. Might go 3 days without a seizures. She has a vagal nerve stimulator. Increased to Vimpat 200 mg twice daily. lamictal 250mg  BID and Keppra 2000 mg twice daily  Past Medical History:  Diagnosis Date  . Anxiety   . Arthritis   . Cancer (HCC)    Osteosarcoma, right leg  . CVA (cerebrovascular accident due to intracerebral hemorrhage) ([place]) 2004  . Depression   . DVT (deep venous thrombosis) (HCC)    Left leg  . MRSA (methicillin resistant Staphylococcus aureus)   . 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 Seizures ([place])   . Thyroid disease   . 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 ABOVE KNEE LEG AMPUTATION Right    Osteosarcoma  . BRAIN SURGERY  2004   cerebral aneurysm  . FRACTURE SURGERY     [place]t.  [place], folllowed by MRSA  . TOTAL KNEE ARTHROPLASTY Right     Family History  Problem Relation Age of Onset  . Anxiety disorder Mother   . Diabetes Father      Social History:  reports that she has never smoked. She has never used smokeless tobacco. She reports that she does not drink alcohol or use drugs.  Allergies:  Allergies  Allergen Reactions  . Vicodin [Hydrocodone-Acetaminophen] Nausea And Vomiting  . Morphine Rash  . Morphine And Related Rash    Allergic reaction only in iv form    Medications:  Current Facility-Administered Medications  Medication Dose Route Frequency Provider Last Rate Last Dose  . lacosamide (VIMPAT) 200 mg in sodium chloride 0.9 % 25 mL IVPB  200 mg Intravenous Q12H Adhikari, Amrit, MD      . levETIRAcetam (KEPPRA) 2,000 mg in sodium chloride 0.9 % 100 mL IVPB  2,000 mg Intravenous Q12H Shelly Coss, MD 480 mL/hr at 12/31/17 0817 2,000 mg at 12/31/17 0817  . LORazepam (ATIVAN) injection 2 mg  2 mg Intravenous Q4H PRN Shelly Coss, MD      . venlafaxine XR (EFFEXOR-XR) 24 hr capsule 150 mg  150 mg Oral Q breakfast Shelly Coss, MD   150 mg at 12/31/17 0802   Current Outpatient Medications  Medication Sig Dispense Refill  . acetaminophen (TYLENOL) 500 MG tablet Take 1,000 mg by mouth every 8 (eight) hours as needed for mild pain or headache.     . Ascorbic Acid (VITAMIN C) 1000 MG tablet Take 1,000 mg by mouth daily.     . Cholecalciferol (VITAMIN D) 50 MCG (2000 UT) CAPS Take 1 capsule by mouth daily.    . diazepam (DIASAT) 20 MG GEL Place 20 mg rectally daily as needed. (Patient taking differently: Place 20 mg rectally daily as needed (Seizure). ) 1 Package 3  . ketoconazole (NIZORAL) 2 % shampoo Apply 1 application topically 2 (two) times a week.   1  . lacosamide (VIMPAT) 200 MG TABS tablet Take 1 tablet (200 mg total) by  mouth 2 (two) times daily. 180 tablet 1  . lamoTRIgine (LAMICTAL) 200 MG tablet Take 1 tablet (200 mg total) by mouth 2 (two) times daily. 180 tablet 3  . lamoTRIgine (LAMICTAL) 25 MG tablet 2 TABLETS TWICE A DAY (Patient taking differently: Take 50 mg by mouth 2 (two) times daily. ) 360 tablet 3  . levETIRAcetam (KEPPRA) 1000 MG tablet TAKE 2 TABLETS (2,000 MG TOTAL) BY MOUTH 2 (TWO) TIMES DAILY. (Patient taking differently: Take 2,000 mg by mouth 2 (two) times daily. ) 360 tablet 1  . levothyroxine (SYNTHROID, LEVOTHROID) 50 MCG tablet Take 50 mcg by mouth daily before breakfast.    . Prenatal Vit-Fe Fumarate-FA (PRENATAL MULTIVITAMIN) TABS tablet Take 1 tablet by mouth daily at 12 noon.    . promethazine (PHENERGAN) 25 MG tablet Take 25 mg by mouth every 6 (six) hours as needed. nausea    . ranitidine (ZANTAC) 150 MG tablet Take 150 mg by mouth at bedtime.   2  . venlafaxine XR (EFFEXOR-XR) 150 MG 24 hr capsule TAKE 1 CAPSULE (150 MG TOTAL) BY MOUTH DAILY WITH BREAKFAST. 90 capsule 0  . warfarin (COUMADIN) 5 MG tablet Take 7.5-10 mg by mouth See admin instructions. Pt takes one and a half tablets (7.5mg  total) on Sunday and Wednesday and 2 tablets (10mg  total) all other days.      ROS:  unobtainable from patient due to mental status  General Examination:                                                                                                      Blood pressure 116/75, pulse 100, temperature 97.9 F (36.6 C), temperature source Oral, resp. rate 15, weight 79.6 kg, SpO2 99 %.  HEENT-  Normocephalic, no lesions, without obvious abnormality.  Normal external eye and conjunctiva.  Cardiovascular- S1-S2 audible, pulses palpable throughout   Lungs-no rhonchi or wheezing noted, no excessive working breathing.  Saturations within normal limits on RA Abdomen-  All 4 quadrants palpated and nontender Extremities- Warm, dry and intact Musculoskeletal-no joint tenderness, deformity or swelling Skin-warm and dry, no hyperpigmentation, vitiligo, or suspicious lesions  Neurological Examination Mental Status: Asleep, in bed, eyes closed. Easily aroused to light touch and name calling.  Oriented to name and age only. Speech fluent without evidence of aphasia.  Some confusion. Able to follow some simple commands without difficulty. No seizure like activity noted on exam.  Cranial Nerves: II: left homonymous hemaniopsia  III,IV, VI: ptosis not present,patient not cooperative with EOEM. pupils equal, round, reactive to light 32mm and briskly reactive V,VII: smile asymmetric, left facial droop. facial light touch sensation normal bilaterally VIII: hearing normal bilaterally IX,X: uvula rises symmetrically XI: bilateral shoulder shrug XII: midline tongue extension Motor: Right : Upper extremity   5/5 Left:     Upper extremity   3/5  Lower extremity   BKA Lower extremity   5/5 Tone and bulk:normal tone throughout; no atrophy noted Sensory:  light touch intact throughout, bilaterally Deep Tendon Reflexes: 2+ and left patella and bilateral biceps Plantars: Right: bka   Left: downgoing Cerebellar: UTA Gait: deferred   Lab Results: Basic Metabolic Panel: Recent Labs  Lab 12/31/17 0613  NA 140  K 3.9  CL 103  CO2 31  GLUCOSE 99  BUN 16  CREATININE 0.99  CALCIUM 9.4    CBC: Recent Labs  Lab 12/31/17 0613  WBC 3.5*  NEUTROABS 1.5*  HGB 11.5*  HCT 36.8  MCV 91.3  PLT 204    Assessment and plan discussed with with attending physician and they are in agreement.   Laurey Morale, MSN, NP-C [facility] [phone]   Attending Neurologist note to follow:   Attending addendum Patient seen and examined at the emergency room at [facility] prior to transfer to [facility] with the history and physical  documented above by the time. Although on my examination later, she was much more awake, was able to tell me her name, was able to follow commands-simple commands, no focal motor deficits, no focal sensory deficits and no cranial nerve deficits observed at the time. Her speech was not dysarthric but she was slow in answering questions.  She seemed to be awake, alert and oriented to self and the fact that she was in the hospital.  Assessment Patient with intractable seizures, on Keppra, Vimpat and Lamictal as well as a vagal nerve stimulator with breakthrough seizure and prolonged postictal phase-concern initially  for status epilepticus. At the time of my examination she was very cooperative and the exam was nonfocal-low suspicion for status epilepticus.  Recommendations I advised the ED provider to add a fosphenytoin load for her since there was concern for ongoing status epilepticus. She has received a fosphenytoin load in the emergency room. Going forward, continue with home doses of Keppra 2000 mg IV twice daily, Vimpat 200 IV twice daily, Lamictal  250 mg BID PO Maintain seizure precautions Routine EEG Call neurology with concerns that she has breakthrough seizures Ativan as needed for seizures longer than 5 minutes. Check urinalysis Check chest x-ray Check basic labs including mag and phos. We will continue to follow  -- Amie Portland, MD [facility] Pager: [phone] If 7pm to 7am, please call on call as listed on AMION.

## 2018-01-01 ENCOUNTER — Other Ambulatory Visit (HOSPITAL_COMMUNITY): Payer: Self-pay

## 2018-01-01 ENCOUNTER — Telehealth: Payer: Self-pay | Admitting: Neurology

## 2018-01-01 DIAGNOSIS — D649 Anemia, unspecified: Secondary | ICD-10-CM | POA: Diagnosis not present

## 2018-01-01 DIAGNOSIS — E039 Hypothyroidism, unspecified: Secondary | ICD-10-CM | POA: Diagnosis not present

## 2018-01-01 DIAGNOSIS — Z86718 Personal history of other venous thrombosis and embolism: Secondary | ICD-10-CM | POA: Diagnosis not present

## 2018-01-01 DIAGNOSIS — G40909 Epilepsy, unspecified, not intractable, without status epilepticus: Secondary | ICD-10-CM | POA: Diagnosis not present

## 2018-01-01 DIAGNOSIS — I499 Cardiac arrhythmia, unspecified: Secondary | ICD-10-CM | POA: Diagnosis not present

## 2018-01-01 DIAGNOSIS — G40901 Epilepsy, unspecified, not intractable, with status epilepticus: Secondary | ICD-10-CM | POA: Diagnosis not present

## 2018-01-01 DIAGNOSIS — Z8673 Personal history of transient ischemic attack (TIA), and cerebral infarction without residual deficits: Secondary | ICD-10-CM | POA: Diagnosis not present

## 2018-01-01 DIAGNOSIS — Z85830 Personal history of malignant neoplasm of bone: Secondary | ICD-10-CM | POA: Diagnosis not present

## 2018-01-01 DIAGNOSIS — M329 Systemic lupus erythematosus, unspecified: Secondary | ICD-10-CM | POA: Diagnosis not present

## 2018-01-01 DIAGNOSIS — F329 Major depressive disorder, single episode, unspecified: Secondary | ICD-10-CM | POA: Diagnosis not present

## 2018-01-01 DIAGNOSIS — Z7901 Long term (current) use of anticoagulants: Secondary | ICD-10-CM | POA: Diagnosis not present

## 2018-01-01 DIAGNOSIS — D6862 Lupus anticoagulant syndrome: Secondary | ICD-10-CM | POA: Diagnosis not present

## 2018-01-01 LAB — COMPREHENSIVE METABOLIC PANEL
ALBUMIN: 3.7 g/dL (ref 3.5–5.0)
ALK PHOS: 114 U/L (ref 38–126)
ALT: 28 U/L (ref 0–44)
ANION GAP: 7 (ref 5–15)
AST: 26 U/L (ref 15–41)
BILIRUBIN TOTAL: 0.2 mg/dL — AB (ref 0.3–1.2)
BUN: 9 mg/dL (ref 6–20)
CALCIUM: 9.3 mg/dL (ref 8.9–10.3)
CO2: 27 mmol/L (ref 22–32)
Chloride: 104 mmol/L (ref 98–111)
Creatinine, Ser: 0.96 mg/dL (ref 0.44–1.00)
GFR calc Af Amer: >60 mL/min (ref >60)
GFR calc non Af Amer: >60 mL/min (ref >60)
GLUCOSE: 97 mg/dL (ref 70–99)
Potassium: 4 mmol/L (ref 3.5–5.1)
Sodium: 138 mmol/L (ref 135–145)
TOTAL PROTEIN: 6.9 g/dL (ref 6.5–8.1)

## 2018-01-01 LAB — PROTIME-INR
INR: 3.66
Prothrombin Time: 35.8 seconds — ABNORMAL HIGH (ref 11.4–15.2)

## 2018-01-01 MED ORDER — WARFARIN SODIUM 5 MG PO TABS: 7.5000 mg | ORAL_TABLET | ORAL | Status: DC

## 2018-01-01 MED ORDER — DIAZEPAM 20 MG RE GEL: 20.0000 mg | Freq: Every day | RECTAL | 0 refills | Status: DC | PRN

## 2018-01-01 MED ORDER — DILTIAZEM HCL 25 MG/5ML IV SOLN
10.0000 mg | Freq: Four times a day (QID) | INTRAVENOUS | Status: DC | PRN
  Filled 2018-01-01: qty 5

## 2018-01-01 MED ORDER — LEVETIRACETAM 1000 MG PO TABS: 2000.0000 mg | ORAL_TABLET | Freq: Two times a day (BID) | ORAL | Status: DC

## 2018-01-01 MED ORDER — MAGNESIUM SULFATE 2 GM/50ML IV SOLN
2.0000 g | Freq: Once | INTRAVENOUS | Status: AC
  Administered 2018-01-01: 2 g via INTRAVENOUS
  Filled 2018-01-01: qty 50

## 2018-01-01 NOTE — Progress Notes (Signed)
I notified Dr. Candiss Norse and Dr. Rory Percy that told her mom "I'm going to seize now" and then pt curled up and began thrashing arms and legs.  After 1-2 minutes, activity stopped and pt said she was ready to go home now.  Mother states she is not comfortable taking pt home.  Dr. Rory Percy came to room within a few minutes after episode.

## 2018-01-01 NOTE — Discharge Summary (Addendum)
Cassandra Andrade DJT:[id number] DOB: 11/07/1965 DOA: 12/31/2017  PCP: Berkley Harvey, NP  Admit date: 12/31/2017  Discharge date: 01/01/2018  Admitted From: Home  Disposition:  Home   Recommendations for Outpatient Follow-up:   Follow up with PCP in 1-2 weeks  PCP Please obtain BMP/CBC, 2 view CXR in 1week,  (see Discharge instructions)   PCP Please follow up on the following pending results: Letter BMP, INR and magnesium levels closely.  Must follow with her neurologist within a week   Home Health: None Equipment/Devices: None  Consultations: Neuro Discharge Condition: Stable   CODE STATUS: Full   Diet Recommendation: Heart Healthy     Chief Complaint  Patient presents with  . Seizures     Brief history of present illness from the day of admission and additional interim summary    Cassandra Andrade is a 52 y.o. female with medical history significant of seizure disorder, status epilepticus, stroke, lupus anticoagulant on warfarin who presents to the emergency department with complaints of seizure episodes at home.  It was not described as tonic-clonic seizures.  Patient was reported to be alert and oriented during these episodes and was not postictal.  She felt jerky movements on on her upper extremities but more on the right. Patient also has a vagal nerve stimulator.  Patient also takes rectal diazepam as needed for seizures but did not do that much of  benefit.  She denied any urinary or fecal incontinence, tongue bite.                                                                 Hospital Course   Seizure disorder: Noted to have seizures at home long-standing history of seizures, had a breakthrough seizure at home with prolonged postictal phase.  Seen by neurology here.  Status epilepticus ruled out.  She was  given IV load of fosphenytoin after which she has been at baseline, home medications will be continued, she will be discharged home with close outpatient follow-up with her neurologist Dr. [name] within a week.  She already does not drive but has been told not to begin driving until she has been cleared by neurology if she wishes to do so.  She also has a vagal nerve stimulator which can be rechecked by her neurologist next visit.   History of bilateral DVT/lupus anticoagulant: Follows with hematology as an outpatient.  Continue Coumadin, since INR was 3.36 she was requested to resume her home dose on 01/03/2018 and get an INR checked by PCP that day.  Depression: Continue venlafaxine  Hypothyroidism: Continue Synthyroid  History of osteosarcoma of right leg: Status post radiation/chemotherapy/surgery.  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  Dysrhythmia on telemetry.  Artifact, was asymptomatic, had hypomagnesemia which was replaced.  EKG stable.  Discharge diagnosis     Principal Problem:   Seizure disorder [facility]) Active Problems:   Depression   Hypothyroid   Seizures (HCC)   Lupus anticoagulant disorder ([place])   Long term current use of anticoagulant therapy    Discharge instructions    Discharge Instructions    Diet - low sodium heart healthy   Complete by:  As directed    Discharge instructions   Complete by:  As directed    Follow with Primary MD Berkley Harvey, NP in 2 days   Get CBC, CMP INR checked  by Primary MD or SNF MD in 2 days    Activity: As tolerated with Full fall precautions use walker/cane & assistance as needed  Disposition Home   Diet: Heart Healthy  with feeding assistance and aspiration precautions.  For Heart failure patients - Check your Weight same time everyday, if you gain over 2 pounds, or you develop in leg swelling, experience more shortness of breath or chest pain, call your Primary MD  immediately. Follow Cardiac Low Salt Diet and 1.5 lit/day fluid restriction.  Special Instructions: If you have smoked or chewed Tobacco  in the last 2 yrs please stop smoking, stop any regular Alcohol  and or any Recreational drug use.  On your next visit with your primary care physician please Get Medicines reviewed and adjusted.  Please request your Prim.MD to go over all Hospital Tests and Procedure/Radiological results at the follow up, please get all Hospital records sent to your Prim MD by signing hospital release before you go home.  If you experience worsening of your admission symptoms, develop shortness of breath, life threatening emergency, suicidal or homicidal thoughts you must seek medical attention immediately by calling 911 or calling your MD immediately  if symptoms less severe.  You Must read complete instructions/literature along with all the possible adverse reactions/side effects for all the Medicines you take and that have been prescribed to you. Take any new Medicines after you have completely understood and accpet all the possible adverse reactions/side effects.   Do not drive, operate heavy machinery, perform activities at heights, swimming or participation in water activities or provide baby sitting services if your were admitted for syncope or siezures until you have seen by Primary MD or a Neurologist and advised to do so again.   Increase activity slowly   Complete by:  As directed       Discharge Medications   Allergies as of 01/01/2018      Reactions   Vicodin [hydrocodone-acetaminophen] Nausea And Vomiting   Morphine Rash   Morphine And Related Rash   Allergic reaction only in iv form      Medication List    TAKE these medications   acetaminophen 500 MG tablet Commonly known as:  TYLENOL Take 1,000 mg by mouth every 8 (eight) hours as needed for mild pain or headache.   diazepam 20 MG Gel Commonly known as:  DIASAT Place 20 mg rectally daily as  needed. What changed:  reasons to take this   ketoconazole 2 % shampoo Commonly known as:  NIZORAL Apply 1 application topically 2 (two) times a week.   lacosamide 200 MG Tabs tablet Commonly known as:  VIMPAT Take 1 tablet (200 mg total) by mouth 2 (two) times daily.   lamoTRIgine 200 MG tablet Commonly known as:  LAMICTAL Take 1 tablet (200 mg total) by mouth 2 (two) times daily. What changed:  Another medication with the same name was  changed. Make sure you understand how and when to take each.   lamoTRIgine 25 MG tablet Commonly known as:  LAMICTAL 2 TABLETS TWICE A DAY What changed:    how much to take  how to take this  when to take this  additional instructions   levETIRAcetam 1000 MG tablet Commonly known as:  KEPPRA Take 2 tablets (2,000 mg total) by mouth 2 (two) times daily. What changed:  See the new instructions.   levothyroxine 50 MCG tablet Commonly known as:  SYNTHROID, LEVOTHROID Take 50 mcg by mouth daily before breakfast.   prenatal multivitamin Tabs tablet Take 1 tablet by mouth daily at 12 noon.   promethazine 25 MG tablet Commonly known as:  PHENERGAN Take 25 mg by mouth every 6 (six) hours as needed. nausea   ranitidine 150 MG tablet Commonly known as:  ZANTAC Take 150 mg by mouth at bedtime.   venlafaxine XR 150 MG 24 hr capsule Commonly known as:  EFFEXOR-XR TAKE 1 CAPSULE (150 MG TOTAL) BY MOUTH DAILY WITH BREAKFAST.   vitamin C 1000 MG tablet Take 1,000 mg by mouth daily.   Vitamin D 50 MCG (2000 UT) Caps Take 1 capsule by mouth daily.   warfarin 5 MG tablet Commonly known as:  COUMADIN Take 1.5-2 tablets (7.5-10 mg total) by mouth See admin instructions. Pt takes one and a half tablets (7.5mg  total) on Sunday and Wednesday and 2 tablets (10mg  total) all other days. Start taking on:  01/03/2018 What changed:  These instructions start on 01/03/2018. If you are unsure what to do until then, ask your doctor or other care  provider.       Follow-up Information    Berkley Harvey, NP. Schedule an appointment as soon as possible for a visit in 1 week(s).   Specialty:  Nurse Practitioner Why:  and Your neurologist within 4 to 5 days Contact information: [place] [address] [phone]           Major procedures and Radiology Reports - PLEASE review detailed and final reports thoroughly  -         Dg Knee Complete 4 Views Left  Result Date: 12/07/2017 CLINICAL DATA:  Acute left knee pain and swelling after injury several days ago. EXAM: LEFT KNEE - COMPLETE 4+ VIEW COMPARISON:  Radiographs of August 13, 2013. FINDINGS: No evidence of fracture, dislocation, or joint effusion. Chondrocalcinosis is noted in the medial and lateral joint spaces. Mild osteophyte formation is noted laterally. Soft tissues are unremarkable. IMPRESSION: Mild degenerative changes are noted in the left knee. No acute abnormality is noted. Electronically Signed   By: Marijo Conception, M.D.   On: 12/07/2017 09:06    Micro Results     Recent Results (from the past 240 hour(s))  MRSA PCR Screening     Status: None   Collection Time: 12/31/17  3:15 PM  Result Value Ref Range Status   MRSA by PCR NEGATIVE NEGATIVE Final    Comment:        The GeneXpert MRSA Assay (FDA approved for NASAL specimens only), is one component of a comprehensive MRSA colonization surveillance program. It is not intended to diagnose MRSA infection nor to guide or monitor treatment for MRSA infections. Performed at [facility], [place] [address], [place], [place] [address]     Today   Subjective    Cassandra Andrade today has no headache,no chest abdominal pain,no new weakness tingling  or numbness, feels much better wants to go home today.     Objective   Blood pressure 125/78, pulse 84, temperature 98.1 F (36.7 C), temperature source Oral, resp. rate 13, weight 79.6 kg, SpO2 100 %.   Intake/Output Summary  (Last 24 hours) at 01/01/2018 0927 Last data filed at 01/01/2018 0646 Gross per 24 hour  Intake 145 ml  Output 550 ml  Net -405 ml    Exam Awake Alert, No new F.N deficits, Normal affect [place].AT,PERRAL Supple Neck,No JVD, No cervical lymphadenopathy appriciated.  Symmetrical Chest wall movement, Good air movement bilaterally, CTAB RRR,No Gallops,Rubs or new Murmurs, No Parasternal Heave +ve B.Sounds, Abd Soft, Non tender, No organomegaly appriciated, No rebound -guarding or rigidity. No Cyanosis, Clubbing or edema, No new Rash or bruise, old R. BKA   Data Review   CBC w Diff:  Lab Results  Component Value Date   WBC 3.5 (L) 12/31/2017   HGB 11.5 (L) 12/31/2017   HGB 11.5 (L) 01/12/2017   HCT 36.8 12/31/2017   HCT 36.9 01/12/2017   PLT 204 12/31/2017   PLT 197 01/12/2017   PLT 201 12/04/2015   LYMPHOPCT 43 12/31/2017   LYMPHOPCT 54.0 (H) 01/12/2017   MONOPCT 10 12/31/2017   MONOPCT 8.8 01/12/2017   EOSPCT 2 12/31/2017   EOSPCT 1.0 01/12/2017   BASOPCT 1 12/31/2017   BASOPCT 0.5 01/12/2017    CMP:  Lab Results  Component Value Date   NA 138 01/01/2018   NA 142 06/22/2017   NA 141 01/12/2017   K 4.0 01/01/2018   K 4.5 01/12/2017   CL 104 01/01/2018   CL 97 (L) 07/11/2013   CO2 27 01/01/2018   CO2 30 (H) 01/12/2017   BUN 9 01/01/2018   BUN 18 06/22/2017   BUN 11.2 01/12/2017   CREATININE 0.96 01/01/2018   CREATININE 0.9 01/12/2017   PROT 6.9 01/01/2018   PROT 7.2 06/22/2017   PROT 7.8 01/12/2017   ALBUMIN 3.7 01/01/2018   ALBUMIN 4.4 06/22/2017   ALBUMIN 4.0 01/12/2017   BILITOT 0.2 (L) 01/01/2018   BILITOT <0.2 06/22/2017   BILITOT 0.23 01/12/2017   ALKPHOS 114 01/01/2018   ALKPHOS 105 01/12/2017   AST 26 01/01/2018   AST 34 01/12/2017   ALT 28 01/01/2018   ALT 65 (H) 01/12/2017  . Lab Results  Component Value Date   INR 3.66 01/01/2018   INR 2.46 12/31/2017   INR 2.70 04/04/2014   PROTIME 32.4 (H) 04/04/2014   PROTIME Sent out for  confirmation 07/11/2013     Total Time in preparing paper work, data evaluation and todays exam - 37 minutes  Lala Lund M.D on 01/01/2018 at 9:27 AM  [facility]   Office  [phone]

## 2018-01-01 NOTE — Progress Notes (Signed)
Neurology Progress Note   S:// Seen and examined. Awake, drinking her morning coffee comfortably in bed.   O:// Current vital signs: BP 125/78   Pulse 84   Temp 98.1 F (36.7 C) (Oral)   Resp 13   Wt 79.6 kg   SpO2 100%   BMI 27.08 kg/m  Vital signs in last 24 hours: Temp:  [97.9 F (36.6 C)-98.2 F (36.8 C)] 98.1 F (36.7 C) (11/10 0435) Pulse Rate:  [84-94] 84 (11/09 2023) Resp:  [13-16] 13 (11/09 2023) BP: (111-127)/(61-78) 125/78 (11/09 1906) SpO2:  [97 %-100 %] 100 % (11/09 2023) General: Patient is awake alert in no distress HEENT: Normocephalic and atraumatic dry mucous membranes no lymphadenopathy Lungs: Clear to auscultation Cardia vascular: S1-S2 heard regular rate rhythm Abdomen: Nondistended nontender Extremities-right AKA Neurological exam Awake alert oriented x3 Speech is mildly dysarthric but at her baseline Naming, attention repetition is intact Slightly reduced attention concentration Cranial nerves: Pupils equal round react light extra movements are intact horizontally but somewhat restricted in vertical gaze, left hemianopsia, face symmetric, facial sensation intact, palate midline, tongue midline. Motor exam: 5/5 in right upper extremity, left upper extremity, left lower extremity and right lower extremity at the hip.  There is right AKA. Sensory exam: Intact light touch all over with no extinction Coordination: Intact finger-nose-finger, unable to test heel-knee-shin due to the AKA. DTRs: 2+ symmetric Left toe downgoing, right-unable to perform  Medications  Current Facility-Administered Medications:  .  diltiazem (CARDIZEM) injection 10 mg, 10 mg, Intravenous, Q6H PRN, Thurnell Lose, MD .  lacosamide (VIMPAT) 200 mg in sodium chloride 0.9 % 25 mL IVPB, 200 mg, Intravenous, Q12H, Adhikari, Amrit, MD, Last Rate: 90 mL/hr at 12/31/17 2204, 200 mg at 12/31/17 2204 .  lamoTRIgine (LAMICTAL) tablet 250 mg, 250 mg, Oral, BID, Makhlouf, Burnard Leigh,  RPH, 250 mg at 12/31/17 2158 .  levETIRAcetam (KEPPRA) 2,000 mg in sodium chloride 0.9 % 100 mL IVPB, 2,000 mg, Intravenous, Q12H, Adhikari, Amrit, MD, Last Rate: 480 mL/hr at 01/01/18 0646, 2,000 mg at 01/01/18 0646 .  LORazepam (ATIVAN) injection 2 mg, 2 mg, Intravenous, Q4H PRN, Shelly Coss, MD, 2 mg at 12/31/17 0833 .  venlafaxine XR (EFFEXOR-XR) 24 hr capsule 150 mg, 150 mg, Oral, Q breakfast, Adhikari, Amrit, MD, 150 mg at 12/31/17 0802 .  Warfarin - Pharmacist Dosing Inpatient, , Does not apply, q1800, Minda Ditto, [facility] Labs CBC    Component Value Date/Time   WBC 3.5 (L) 12/31/2017 0613   RBC 4.03 12/31/2017 0613   HGB 11.5 (L) 12/31/2017 0613   HGB 11.5 (L) 01/12/2017 1125   HCT 36.8 12/31/2017 0613   HCT 36.9 01/12/2017 1125   PLT 204 12/31/2017 0613   PLT 197 01/12/2017 1125   PLT 201 12/04/2015 1626   MCV 91.3 12/31/2017 0613   MCV 90.2 01/12/2017 1125   MCH 28.5 12/31/2017 0613   MCHC 31.3 12/31/2017 0613   RDW 12.9 12/31/2017 0613   RDW 14.1 01/12/2017 1125   LYMPHSABS 1.5 12/31/2017 0613   LYMPHSABS 2.1 01/12/2017 1125   MONOABS 0.4 12/31/2017 0613   MONOABS 0.4 01/12/2017 1125   EOSABS 0.1 12/31/2017 0613   EOSABS 0.0 01/12/2017 1125   EOSABS 0.1 12/04/2015 1626   EOSABS 0.1 09/19/2013 1311   BASOSABS 0.0 12/31/2017 0613   BASOSABS 0.0 01/12/2017 1125    CMP     Component Value Date/Time   NA 138 01/01/2018 0354   NA 142 06/22/2017 1622   NA  141 01/12/2017 1125   K 4.0 01/01/2018 0354   K 4.5 01/12/2017 1125   CL 104 01/01/2018 0354   CL 97 (L) 07/11/2013 1346   CO2 27 01/01/2018 0354   CO2 30 (H) 01/12/2017 1125   GLUCOSE 97 01/01/2018 0354   GLUCOSE 79 01/12/2017 1125   GLUCOSE 126 (H) 07/11/2013 1346   BUN 9 01/01/2018 0354   BUN 18 06/22/2017 1622   BUN 11.2 01/12/2017 1125   CREATININE 0.96 01/01/2018 0354   CREATININE 0.9 01/12/2017 1125   CALCIUM 9.3 01/01/2018 0354   CALCIUM 10.1 01/12/2017 1125   PROT 6.9 01/01/2018 0354   PROT  7.2 06/22/2017 1622   PROT 7.8 01/12/2017 1125   ALBUMIN 3.7 01/01/2018 0354   ALBUMIN 4.4 06/22/2017 1622   ALBUMIN 4.0 01/12/2017 1125   AST 26 01/01/2018 0354   AST 34 01/12/2017 1125   ALT 28 01/01/2018 0354   ALT 65 (H) 01/12/2017 1125   ALKPHOS 114 01/01/2018 0354   ALKPHOS 105 01/12/2017 1125   BILITOT 0.2 (L) 01/01/2018 0354   BILITOT <0.2 06/22/2017 1622   BILITOT 0.23 01/12/2017 1125   GFRNONAA >60 01/01/2018 0354   GFRAA >60 01/01/2018 0354  Urinalysis not impressive for UTI  Imaging No imaging to review  Assessment:  Patient with intractable seizures, on Keppra Vimpat Lamictal as well as vagal nerve stimulator with breakthrough seizure and prolonged postictal phase with initial concern for status epilepticus. Required a load with fosphenytoin and slowly came around to her normal baseline exam. Concern for VNS settings not being at the appropriate level. Currently back to baseline.  Recommendations: Continue with current doses of antiepileptics -Keppra 2000 mg p.o. twice daily -Vimpat 200 mg p.o. twice daily -Lamictal 250 mg twice daily p.o. Follow-up with Dr. [name] for follow-up appointment as well as VNS check. Maintain seizure precautions including no driving for 6 months unless seizure-free Per [employer]. Plan relayed to the primary hospitalist in person on the floor.  -- Amie Portland, MD [facility] Pager: [phone] If 7pm to 7am, please call on call as listed on AMION.

## 2018-01-01 NOTE — Care Management CC44 (Signed)
Condition Code 44 Documentation Completed  Patient Details  Name: Cassandra Andrade MRN: [id number] Date of Birth: 1965-11-06   Condition Code 44 given:  Yes Patient signature on Condition Code 44 notice:  Yes Documentation of 2 MD's agreement:  Yes Code 44 added to claim:  Yes    Claudie Leach, RN 01/01/2018, 10:08 AM

## 2018-01-01 NOTE — Progress Notes (Signed)
Nsg Discharge Note  Admit Date:  12/31/2017 Discharge date: 01/01/2018   Cassandra Andrade to be D/C'd Home per MD order.  AVS completed.  Copy for chart, and copy for patient signed, and dated. Patient's mother able to verbalize understanding.  Discharge Medication: Allergies as of 01/01/2018      Reactions   Vicodin [hydrocodone-acetaminophen] Nausea And Vomiting   Morphine Rash   Morphine And Related Rash   Allergic reaction only in iv form      Medication List    TAKE these medications   acetaminophen 500 MG tablet Commonly known as:  TYLENOL Take 1,000 mg by mouth every 8 (eight) hours as needed for mild pain or headache.   diazepam 20 MG Gel Commonly known as:  DIASAT Place 20 mg rectally daily as needed (Seizure).   ketoconazole 2 % shampoo Commonly known as:  NIZORAL Apply 1 application topically 2 (two) times a week.   lacosamide 200 MG Tabs tablet Commonly known as:  VIMPAT Take 1 tablet (200 mg total) by mouth 2 (two) times daily.   lamoTRIgine 200 MG tablet Commonly known as:  LAMICTAL Take 1 tablet (200 mg total) by mouth 2 (two) times daily. What changed:  Another medication with the same name was changed. Make sure you understand how and when to take each.   lamoTRIgine 25 MG tablet Commonly known as:  LAMICTAL 2 TABLETS TWICE A DAY What changed:    how much to take  how to take this  when to take this  additional instructions   levETIRAcetam 1000 MG tablet Commonly known as:  KEPPRA Take 2 tablets (2,000 mg total) by mouth 2 (two) times daily. What changed:  See the new instructions.   levothyroxine 50 MCG tablet Commonly known as:  SYNTHROID, LEVOTHROID Take 50 mcg by mouth daily before breakfast.   prenatal multivitamin Tabs tablet Take 1 tablet by mouth daily at 12 noon.   promethazine 25 MG tablet Commonly known as:  PHENERGAN Take 25 mg by mouth every 6 (six) hours as needed. nausea   ranitidine 150 MG tablet Commonly known as:   ZANTAC Take 150 mg by mouth at bedtime.   venlafaxine XR 150 MG 24 hr capsule Commonly known as:  EFFEXOR-XR TAKE 1 CAPSULE (150 MG TOTAL) BY MOUTH DAILY WITH BREAKFAST.   vitamin C 1000 MG tablet Take 1,000 mg by mouth daily.   Vitamin D 50 MCG (2000 UT) Caps Take 1 capsule by mouth daily.   warfarin 5 MG tablet Commonly known as:  COUMADIN Take 1.5-2 tablets (7.5-10 mg total) by mouth See admin instructions. Pt takes one and a half tablets (7.5mg  total) on Sunday and Wednesday and 2 tablets (10mg  total) all other days. Start taking on:  01/03/2018 What changed:  These instructions start on 01/03/2018. If you are unsure what to do until then, ask your doctor or other care provider.       Discharge Assessment: Vitals:   01/01/18 1100 01/01/18 1152  BP: 114/80 132/85  Pulse:  91  Resp:    Temp:  98.6 F (37 C)  SpO2:  99%   Skin clean, dry and intact without evidence of skin break down, no evidence of skin tears noted. IV catheter discontinued intact. Site without signs and symptoms of complications - no redness or edema noted at insertion site, patient denies c/o pain - only slight tenderness at site.  Dressing with slight pressure applied.  D/c Instructions-Education: Discharge instructions given to patient/family with verbalized  understanding. D/c education completed with patient/family including follow up instructions, medication list, d/c activities limitations if indicated, with other d/c instructions as indicated by MD - patient able to verbalize understanding, all questions fully answered. Patient instructed to return to ED, call 911, or call MD for any changes in condition.  Patient escorted via [place], and D/C home via private auto.  Hassan Rowan, RN 01/01/2018 2:45 PM

## 2018-01-01 NOTE — Progress Notes (Signed)
Patient alert x 2-3, denies pain with assess, rested well throughout shift.  Sitter at bedside, no behaviors or seizure activities noted.  Call from telemetry received around [place] noted on patient.  V-tach noted with heartrate as high as 170's.  Dr. Candiss Norse notified with patient status.  Patient denies distress, S1S2 auditory.  Patient stable condition, will continue to monitor.

## 2018-01-01 NOTE — Telephone Encounter (Signed)
I called the mother.  The patient was in the hospital with status epilepticus, she continues to have frequent seizures.  We will get a revisit next week, recheck the vagal nerve stimulator.

## 2018-01-01 NOTE — Discharge Instructions (Signed)
Follow with Primary MD Berkley Harvey, NP in 2 days   Get CBC, CMP INR checked  by Primary MD or SNF MD in 2 days    Activity: As tolerated with Full fall precautions use walker/cane & assistance as needed  Disposition Home   Diet: Heart Healthy  with feeding assistance and aspiration precautions.  For Heart failure patients - Check your Weight same time everyday, if you gain over 2 pounds, or you develop in leg swelling, experience more shortness of breath or chest pain, call your Primary MD immediately. Follow Cardiac Low Salt Diet and 1.5 lit/day fluid restriction.  Special Instructions: If you have smoked or chewed Tobacco  in the last 2 yrs please stop smoking, stop any regular Alcohol  and or any Recreational drug use.  On your next visit with your primary care physician please Get Medicines reviewed and adjusted.  Please request your Prim.MD to go over all Hospital Tests and Procedure/Radiological results at the follow up, please get all Hospital records sent to your Prim MD by signing hospital release before you go home.  If you experience worsening of your admission symptoms, develop shortness of breath, life threatening emergency, suicidal or homicidal thoughts you must seek medical attention immediately by calling 911 or calling your MD immediately  if symptoms less severe.  You Must read complete instructions/literature along with all the possible adverse reactions/side effects for all the Medicines you take and that have been prescribed to you. Take any new Medicines after you have completely understood and accpet all the possible adverse reactions/side effects.   Do not drive, operate heavy machinery, perform activities at heights, swimming or participation in water activities or provide baby sitting services if your were admitted for syncope or siezures until you have seen by Primary MD or a Neurologist and advised to do so again.

## 2018-01-02 LAB — HIV ANTIBODY (ROUTINE TESTING W REFLEX): HIV Screen 4th Generation wRfx: NONREACTIVE

## 2018-01-02 NOTE — Telephone Encounter (Signed)
Spoke with mother and gave appt. 01/03/18, arrival time 1130 for a 1200 appt/fim

## 2018-01-03 ENCOUNTER — Ambulatory Visit (INDEPENDENT_AMBULATORY_CARE_PROVIDER_SITE_OTHER): Payer: Medicare Other | Admitting: Neurology

## 2018-01-03 ENCOUNTER — Encounter: Payer: Self-pay | Admitting: Neurology

## 2018-01-03 VITALS — BP 128/81 | HR 93 | Resp 18 | Ht 67.5 in | Wt 175.0 lb

## 2018-01-03 DIAGNOSIS — G40119 Localization-related (focal) (partial) symptomatic epilepsy and epileptic syndromes with simple partial seizures, intractable, without status epilepticus: Secondary | ICD-10-CM

## 2018-01-03 DIAGNOSIS — G40919 Epilepsy, unspecified, intractable, without status epilepticus: Secondary | ICD-10-CM | POA: Diagnosis not present

## 2018-01-03 DIAGNOSIS — Z5181 Encounter for therapeutic drug level monitoring: Secondary | ICD-10-CM

## 2018-01-03 NOTE — Procedures (Signed)
   History:Cassandra Easonis a 52 year old patient with a history of intractable epilepsy. The patient has had a vagal nerve stimulator placed, she continues to have frequent seizures, she may have seizures at least every 2-3 days.  The patient is tolerating the vagal nerve stimulator well.  She comes in for a reevaluation.   VAGAL NERVE STIMULATOR SETTINGS:  Output current: 1.75 milliamps  Signal frequency: 30Hz   Pulse width: 262microseconds  On time: 30seconds change to 21 seconds  Off time: 1.8 minutes change to 1.1 minutes  Magnet current: 2.0 milliamps  Magnet on time: 60 seconds  Pulse width:260microseconds  Lead impedance:2668Ohms  IFI: No  The VNS unit was interrogated, and the settings were changed as above. The patient appeared to tolerate the changes well.  Jill Alexanders MD 10/12/20174:18 PM  [facility] [address] [place], [place] [address]  Phone [phone] Fax [phone]

## 2018-01-03 NOTE — Progress Notes (Signed)
Reason for visit: Seizures  Cassandra Andrade is an 52 y.o. female  History of present illness:  Cassandra Andrade is a 52 year old right-handed black female with a history of intractable seizures.  The patient has seizures almost daily in nature, at times she may go 2 or 3 days without any seizures.  She was in the hospital on 31 December 2017 with a prolonged seizure lasting greater than 5 minutes.  The patient has a vagal nerve stimulator in place.  She is on Vimpat, Lamictal, and Keppra.  There have been no changes in her medication regimen recently, she has not missed any doses, she has not been sick for any reason.  She comes in today for an evaluation.  Past Medical History:  Diagnosis Date  . Anxiety   . Arthritis   . Cancer (HCC)    Osteosarcoma, right leg  . CVA (cerebrovascular accident due to intracerebral hemorrhage) ([place]) 2004  . Depression   . DVT (deep venous thrombosis) (HCC)    Left leg  . MRSA (methicillin resistant Staphylococcus aureus)   . 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 Seizures ([place])   . Thyroid disease   . 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 ABOVE KNEE LEG AMPUTATION Right    Osteosarcoma  . BRAIN SURGERY  2004   cerebral aneurysm  . FRACTURE SURGERY     Rt. femur, folllowed by MRSA  . TOTAL KNEE ARTHROPLASTY Right     Family History  Problem Relation Age of Onset  . Anxiety disorder Mother   . Diabetes Father     Social history:  reports that she has never smoked. She has never used smokeless tobacco. She reports that she does not drink alcohol or use drugs.    Allergies  Allergen Reactions  . Vicodin [Hydrocodone-Acetaminophen] Nausea And Vomiting  . Morphine Rash  . Morphine And Related Rash    Allergic reaction only in iv form    Medications:  Prior to Admission medications   Medication Sig Start Date End Date Taking? Authorizing  Provider  acetaminophen (TYLENOL) 500 MG tablet Take 1,000 mg by mouth every 8 (eight) hours as needed for mild pain or headache.     [provider]  Ascorbic Acid (VITAMIN C) 1000 MG tablet Take 1,000 mg by mouth daily.  08/17/11   [provider]  Cholecalciferol (VITAMIN D) 50 MCG (2000 UT) CAPS Take 1 capsule by mouth daily.    [provider]  diazepam (DIASAT) 20 MG GEL Place 20 mg rectally daily as needed (Seizure). 01/01/18   Thurnell Lose, MD  ketoconazole (NIZORAL) 2 % shampoo Apply 1 application topically 2 (two) times a week.  01/21/17   [provider]  lacosamide (VIMPAT) 200 MG TABS tablet Take 1 tablet (200 mg total) by mouth 2 (two) times daily. 11/10/17   Kathrynn Ducking, MD  lamoTRIgine (LAMICTAL) 200 MG tablet Take 1 tablet (200 mg total) by mouth 2 (two) times daily. 05/04/17   Kathrynn Ducking, MD  lamoTRIgine (LAMICTAL) 25 MG tablet 2 TABLETS TWICE A DAY Patient taking differently: Take 50 mg by mouth 2 (two) times daily.  05/04/17   Kathrynn Ducking, MD  levETIRAcetam (KEPPRA) 1000 MG tablet Take 2 tablets (2,000 mg total) by mouth 2 (two) times daily. 01/01/18   Thurnell Lose, MD  levothyroxine (SYNTHROID, [place]) 50 MCG  tablet Take 50 mcg by mouth daily before breakfast.    [provider]  Prenatal Vit-Fe Fumarate-FA (PRENATAL MULTIVITAMIN) TABS tablet Take 1 tablet by mouth daily at 12 noon.    [provider]  promethazine (PHENERGAN) 25 MG tablet Take 25 mg by mouth every 6 (six) hours as needed. nausea    [provider]  ranitidine (ZANTAC) 150 MG tablet Take 150 mg by mouth at bedtime.  03/25/17   [provider]  venlafaxine XR (EFFEXOR-XR) 150 MG 24 hr capsule TAKE 1 CAPSULE (150 MG TOTAL) BY MOUTH DAILY WITH BREAKFAST. 11/25/17   Hampton Abbot, MD  warfarin (COUMADIN) 5 MG tablet Take 1.5-2 tablets (7.5-10 mg total) by mouth See admin instructions. Pt takes one and a half tablets  (7.5mg  total) on Sunday and Wednesday and 2 tablets (10mg  total) all other days. 01/03/18   Thurnell Lose, MD    ROS:  Out of a complete 14 system review of symptoms, the patient complains only of the following symptoms, and all other reviewed systems are negative.  Seizures  Blood pressure 128/81, pulse 93, resp. rate 18, height 5' 7.5" (1.715 m), weight 175 lb (79.4 kg).  Physical Exam  General: The patient is alert and cooperative at the time of the examination.  Skin: No significant peripheral edema is noted.   Neurologic Exam  Mental status: The patient is alert and oriented x 3 at the time of the examination. The patient has apparent normal recent and remote memory, with an apparently normal attention span and concentration ability.   Cranial nerves: Facial symmetry is present. Speech is normal, no aphasia or dysarthria is noted. Extraocular movements are full. Visual fields are notable for left homonymous visual field deficit.  Motor: The patient has good strength in all 4 extremities, the patient has a right above-knee amputation.  Sensory examination: Soft touch sensation is symmetric on the face, arms, and legs.  Coordination: The patient has good finger-nose-finger bilaterally.  Gait and station: The patient was not ambulated, she does not have her prosthetic right leg today.  She is in the wheelchair.  Reflexes: Deep tendon reflexes are symmetric.   Assessment/Plan:  1.  Intractable seizures, recent hospitalization  The patient had a more prolonged seizure than usual and was admitted to the hospital.  We will check blood levels of her Keppra and Lamictal today.  The vagal nerve stimulator was reprogrammed.  She will follow-up for next scheduled visit.  Jill Alexanders MD 01/03/2018 12:44 PM  [facility] [address] [place], [place] [address]  Phone [phone] Fax [phone]

## 2018-01-04 ENCOUNTER — Emergency Department (HOSPITAL_COMMUNITY)
Admission: EM | Admit: 2018-01-04 | Discharge: 2018-01-04 | Disposition: A | Discharge status: Home / Self Care | Payer: Medicare Other | Attending: Emergency Medicine | Admitting: Emergency Medicine

## 2018-01-04 ENCOUNTER — Emergency Department (HOSPITAL_COMMUNITY): Payer: Medicare Other

## 2018-01-04 DIAGNOSIS — D68318 Other hemorrhagic disorder due to intrinsic circulating anticoagulants, antibodies, or inhibitors: Secondary | ICD-10-CM | POA: Diagnosis not present

## 2018-01-04 DIAGNOSIS — Z79899 Other long term (current) drug therapy: Secondary | ICD-10-CM | POA: Insufficient documentation

## 2018-01-04 DIAGNOSIS — Z7901 Long term (current) use of anticoagulants: Secondary | ICD-10-CM | POA: Insufficient documentation

## 2018-01-04 DIAGNOSIS — E039 Hypothyroidism, unspecified: Secondary | ICD-10-CM | POA: Diagnosis not present

## 2018-01-04 DIAGNOSIS — R791 Abnormal coagulation profile: Secondary | ICD-10-CM

## 2018-01-04 DIAGNOSIS — R7989 Other specified abnormal findings of blood chemistry: Secondary | ICD-10-CM | POA: Insufficient documentation

## 2018-01-04 DIAGNOSIS — Z8669 Personal history of other diseases of the nervous system and sense organs: Secondary | ICD-10-CM | POA: Insufficient documentation

## 2018-01-04 DIAGNOSIS — G9389 Other specified disorders of brain: Secondary | ICD-10-CM | POA: Diagnosis not present

## 2018-01-04 DIAGNOSIS — R569 Unspecified convulsions: Secondary | ICD-10-CM | POA: Diagnosis not present

## 2018-01-04 LAB — CBC
HEMATOCRIT: 36.2 % (ref 36.0–46.0)
Hemoglobin: 11.3 g/dL — ABNORMAL LOW (ref 12.0–15.0)
MCH: 28.3 pg (ref 26.0–34.0)
MCHC: 31.2 g/dL (ref 30.0–36.0)
MCV: 90.5 fL (ref 80.0–100.0)
NRBC: 0 % (ref 0.0–0.2)
Platelets: 219 10*3/uL (ref 150–400)
RBC: 4 MIL/uL (ref 3.87–5.11)
RDW: 12.7 % (ref 11.5–15.5)
WBC: 3.8 10*3/uL — AB (ref 4.0–10.5)

## 2018-01-04 LAB — COMPREHENSIVE METABOLIC PANEL
ALBUMIN: 3.7 g/dL (ref 3.5–5.0)
ALT: 21 U/L (ref 0–44)
AST: 26 U/L (ref 15–41)
Alkaline Phosphatase: 130 U/L — ABNORMAL HIGH (ref 38–126)
Anion gap: 8 (ref 5–15)
BUN: 12 mg/dL (ref 6–20)
CHLORIDE: 102 mmol/L (ref 98–111)
CO2: 28 mmol/L (ref 22–32)
Calcium: 9.5 mg/dL (ref 8.9–10.3)
Creatinine, Ser: 0.88 mg/dL (ref 0.44–1.00)
GFR calc Af Amer: >60 mL/min (ref >60)
GFR calc non Af Amer: >60 mL/min (ref >60)
Glucose, Bld: 89 mg/dL (ref 70–99)
POTASSIUM: 3.5 mmol/L (ref 3.5–5.1)
SODIUM: 138 mmol/L (ref 135–145)
Total Bilirubin: 0.3 mg/dL (ref 0.3–1.2)
Total Protein: 7.2 g/dL (ref 6.5–8.1)

## 2018-01-04 LAB — APTT: APTT: 73 s — AB (ref 24–36)

## 2018-01-04 MED ORDER — PHYTONADIONE 5 MG PO TABS
2.5000 mg | ORAL_TABLET | Freq: Once | ORAL | Status: AC
  Administered 2018-01-04: 2.5 mg via ORAL
  Filled 2018-01-04: qty 1

## 2018-01-04 NOTE — ED Notes (Signed)
Patient left at this time with all belongings. 

## 2018-01-04 NOTE — Discharge Instructions (Signed)
Your INR was significantly elevated today, this was likely due to the seizure medications you received while you were in the hospital last week, you were given vitamin K here in the hospital today to help bring this back down to a therapeutic level, do not take your Coumadin tonight, recheck INR tomorrow and discuss with your doctor to determine Coumadin regimen.  Return to the emergency department if you have any trauma to the head, severe headache, vision changes, issues with bleeding or bruising or any other new or concerning symptoms.

## 2018-01-04 NOTE — ED Notes (Signed)
To CT at this time.

## 2018-01-04 NOTE — ED Provider Notes (Signed)
[facility] EMERGENCY DEPARTMENT Provider Note   CSN: [id number] Arrival date & time: 01/04/18  1122     History   Chief Complaint Chief Complaint  Patient presents with  . Abnormal Lab    HPI Cassandra Andrade is a 52 y.o. female.   Cassandra Andrade is a 52 y.o. Female with history of CVA from intracranial hemorrhage, DVT, epilepsy, osteosarcoma of the right leg s/p BKA, depression and anxiety, who presents to the emergency department for evaluation of elevated INR.  She reports she routinely checks this every Wednesday and today was notified by her doctor that it was 8.0.  Patient typically takes Coumadin nightly, last dose was yesterday.  She reports last week when it was checked it was 2.2.  She denies any abnormal bleeding or bruising recently.  No recent head trauma or injuries, denies headaches, vision changes, numbness weakness or tingling.  Patient reports she was recently admitted over the weekend for status epilepticus and was given multiple anticonvulsants and wondered if this could have messed up her Coumadin levels.     Past Medical History:  Diagnosis Date  . Anxiety   . Arthritis   . Cancer (HCC)    Osteosarcoma, right leg  . CVA (cerebrovascular accident due to intracerebral hemorrhage) ([place]) 2004  . Depression   . DVT (deep venous thrombosis) (HCC)    Left leg  . MRSA (methicillin resistant Staphylococcus aureus)   . 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 Seizures ([place])   . Thyroid disease   . 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 History of deep venous thrombosis 05/05/2015  . Personal history of osteosarcoma 05/05/2015  . Anemia 11/04/2014  . Abnormal finding on mammography 05/18/2014  . Major depression, recurrent ([place]) 03/19/2014  . Vision disturbance 02/28/2014  . Postprocedural state 12/31/2013  . History of biliary T-tube placement  12/31/2013  . Lupus anticoagulant disorder ([place]) 12/20/2013  . Back pain, chronic 11/07/2013  . Absence of bladder continence 08/05/2013  . History of anticoagulant therapy 07/17/2013  . Long term current use of anticoagulant therapy 07/17/2013  . Partial epilepsy with impairment of consciousness ([place]) 06/21/2013  . Intractable epilepsy ([place]) 02/28/2013  . Absolute anemia 02/28/2013  . Seizures ([place]) 02/21/2013  . Seizure ([place]) 02/17/2013  . DVT, bilateral lower limbs ([place]) 11/07/2012  . Hypothyroid 07/04/2012  . Seizure disorder ([place]) 07/04/2012  . Chronic pain syndrome 07/04/2012  . S/P BKA (below knee amputation) ([place]) 07/04/2012  . Chronic pain associated with significant psychosocial dysfunction 07/04/2012  . Cerebrovascular accident, late effects 06/05/2012  . Status post above knee amputation 06/08/2011  . Mechanical complication of graft of bone, cartilage, muscle or tendon 05/10/2011  . Depression 05/03/2011  . 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 ABOVE KNEE LEG AMPUTATION Right    Osteosarcoma  . BRAIN SURGERY  2004   cerebral aneurysm  . FRACTURE SURGERY     Rt. femur, folllowed by MRSA  . TOTAL KNEE ARTHROPLASTY Right      OB History   None      Home Medications    Prior to Admission medications   Medication Sig Start Date End Date Taking? Authorizing Provider  acetaminophen (TYLENOL) 500 MG tablet Take 1,000 mg by mouth every 8 (eight) hours as needed for mild pain or headache.  Yes [provider]  Ascorbic Acid (VITAMIN C) 1000 MG tablet Take 1,000 mg by mouth daily.  08/17/11  Yes [provider]  Cholecalciferol (VITAMIN D) 50 MCG (2000 UT) CAPS Take 1 capsule by mouth daily.   Yes [provider]  diazepam (DIASAT) 20 MG GEL Place 20 mg rectally daily as needed (Seizure). 01/01/18  Yes Thurnell Lose, MD  ketoconazole (NIZORAL) 2 % shampoo Apply 1  application topically 2 (two) times a week.  01/21/17  Yes [provider]  lacosamide (VIMPAT) 200 MG TABS tablet Take 1 tablet (200 mg total) by mouth 2 (two) times daily. 11/10/17  Yes Kathrynn Ducking, MD  lamoTRIgine (LAMICTAL) 200 MG tablet Take 1 tablet (200 mg total) by mouth 2 (two) times daily. 05/04/17  Yes Kathrynn Ducking, MD  lamoTRIgine (LAMICTAL) 25 MG tablet 2 TABLETS TWICE A DAY Patient taking differently: Take 50 mg by mouth 2 (two) times daily.  05/04/17  Yes Kathrynn Ducking, MD  levETIRAcetam (KEPPRA) 1000 MG tablet Take 2 tablets (2,000 mg total) by mouth 2 (two) times daily. 01/01/18  Yes Thurnell Lose, MD  levothyroxine (SYNTHROID, LEVOTHROID) 50 MCG tablet Take 50 mcg by mouth daily before breakfast.   Yes [provider]  Prenatal Vit-Fe Fumarate-FA (PRENATAL MULTIVITAMIN) TABS tablet Take 1 tablet by mouth daily at 12 noon.   Yes [provider]  promethazine (PHENERGAN) 25 MG tablet Take 25 mg by mouth every 6 (six) hours as needed. nausea   Yes [provider]  ranitidine (ZANTAC) 150 MG tablet Take 150 mg by mouth at bedtime.  03/25/17  Yes [provider]  venlafaxine XR (EFFEXOR-XR) 150 MG 24 hr capsule TAKE 1 CAPSULE (150 MG TOTAL) BY MOUTH DAILY WITH BREAKFAST. 11/25/17  Yes Hampton Abbot, MD  warfarin (COUMADIN) 5 MG tablet Take 1.5-2 tablets (7.5-10 mg total) by mouth See admin instructions. Pt takes one and a half tablets (7.5mg  total) on Sunday and Wednesday and 2 tablets (10mg  total) all other days. 01/03/18  Yes Thurnell Lose, MD    Family History Family History  Problem Relation Age of Onset  . Anxiety disorder Mother   . Diabetes Father     Social History Social History   Tobacco Use  . Smoking status: Never Smoker  . Smokeless tobacco: Never Used  . Tobacco comment: never used tobacco  Substance Use Topics  . Alcohol use: No    Alcohol/week: 0.0 standard drinks  . Drug use: No      Allergies   Vicodin [hydrocodone-acetaminophen]; Morphine; and Morphine and related   Review of Systems Review of Systems  Constitutional: Negative for chills and fever.  HENT: Negative.   Eyes: Negative for visual disturbance.  Respiratory: Negative for cough and shortness of breath.   Cardiovascular: Negative for chest pain and leg swelling.  Gastrointestinal: Negative for abdominal pain, nausea and vomiting.  Genitourinary: Negative for dysuria, flank pain and hematuria.  Musculoskeletal: Negative for arthralgias, joint swelling and myalgias.  Neurological: Negative for dizziness, seizures, syncope, facial asymmetry, speech difficulty, weakness, light-headedness, numbness and headaches.  Hematological: Does not bruise/bleed easily.     Physical Exam Updated Vital Signs BP 133/85   Pulse 73   Resp 15   SpO2 98%   Physical Exam  Constitutional: She is oriented to person, place, and time. She appears well-developed and well-nourished. No distress.  HENT:  Head: Normocephalic and atraumatic.  Mouth/Throat: Oropharynx is clear and moist.  Eyes: Right  eye exhibits no discharge. Left eye exhibits no discharge.  Right pupil larger than left, both pupils are reactive to light, EOMs intact  Neck: Neck supple.  Cardiovascular: Normal rate, regular rhythm, normal heart sounds and intact distal pulses. Exam reveals no gallop and no friction rub.  No murmur heard. Pulmonary/Chest: Effort normal and breath sounds normal. No respiratory distress. She has no wheezes. She has no rales.  Respirations equal and unlabored, patient able to speak in full sentences, lungs clear to auscultation bilaterally  Abdominal: Soft. Bowel sounds are normal. She exhibits no distension and no mass. There is no tenderness. There is no guarding.  Abdomen soft, nondistended, nontender to palpation in all quadrants without guarding or peritoneal signs  Musculoskeletal: She exhibits no edema or deformity.   Neurological: She is alert and oriented to person, place, and time. Coordination normal.  Speech is clear, able to follow commands CN III-XII intact Normal strength in upper and lower extremities bilaterally including dorsiflexion and plantar flexion, strong and equal grip strength Sensation normal to light and sharp touch Moves extremities without ataxia, coordination intact  Skin: Skin is warm and dry. Capillary refill takes less than 2 seconds. She is not diaphoretic.  Psychiatric: She has a normal mood and affect. Her behavior is normal.  Nursing note and vitals reviewed.    ED Treatments / Results  Labs (all labs ordered are listed, but only abnormal results are displayed) Labs Reviewed  PROTIME-INR - Abnormal; Notable for the following components:      Result Value   Prothrombin Time 58.8 (*)    INR 6.94 (*)    All other components within normal limits  APTT - Abnormal; Notable for the following components:   aPTT 73 (*)    All other components within normal limits  CBC - Abnormal; Notable for the following components:   WBC 3.8 (*)    Hemoglobin 11.3 (*)    All other components within normal limits  COMPREHENSIVE METABOLIC PANEL - Abnormal; Notable for the following components:   Alkaline Phosphatase 130 (*)    All other components within normal limits    EKG None  Radiology Ct Head Wo Contrast  Result Date: 01/04/2018 CLINICAL DATA:  Pt arrived via POV after her MD referred her to the ED for lab f/u after abnormal INR was resulted. Pt is alert and oriented x4. Pt has hx of seizures but does not report recent seizure activity. Prior aneurysm surgery. EXAM: CT HEAD WITHOUT CONTRAST TECHNIQUE: Contiguous axial images were obtained from the base of the skull through the vertex without intravenous contrast. COMPARISON:  02/21/2013 FINDINGS: Brain: Are stable changes from a prior right frontoparietal temporal lobe craniotomy a persistent skull defect the anterolateral  right temporal lobe. There is underlying encephalomalacia primarily of the temporal lobe and ex vacuo dilation of the right lateral ventricle. These findings are stable. There are no parenchymal masses or mass effect. There are no extra-axial masses. There are no abnormal fluid collections. There is no evidence of an ischemic infarct. No hydrocephalus. No intracranial hemorrhage. Vascular: No hyperdense vessel or unexpected calcification. Skull: No acute skull abnormality.  No bone lesion. Sinuses/Orbits: Globes and orbits are unremarkable. Visualized sinuses and mastoid air cells are clear. Other: None. IMPRESSION: 1. No acute intracranial abnormalities. 2. Chronic changes from a prior right-sided craniotomy with underlying encephalomalacia. The appearance is stable from prior head CT. Electronically Signed   By: Lajean Manes M.D.   On: 01/04/2018 14:10    Procedures  Procedures (including critical care time)  Medications Ordered in ED Medications  phytonadione (VITAMIN K) tablet 2.5 mg (2.5 mg Oral Given 01/04/18 1530)     Initial Impression / Assessment and Plan / ED Course  I have reviewed the triage vital signs and the nursing notes.  Pertinent labs & imaging results that were available during my care of the patient were reviewed by me and considered in my medical decision making (see chart for details).  Pt presents for evaluation of supratherapeutic INR, was measured to be 8.0 today, last week was 2.2, I think this is likely in the setting of patient receiving multiple IV anticonvulsants over the weekend for status epilepticus, discussed with pharmacy who thinks that phenytoin likely the cause of this elevated INR.  Denies any abnormal bruising or bleeding issues.  Has not taken her Coumadin today, usually takes it in the evenings.  Does have known history of epilepsy, denies any headache or neuro deficits, patient is noted to have different sized pupils on exam, chart review does not reveal  this being previously documented and mom is not sure if this is chronic or not, CT of the head obtained which shows no evidence of acute intracranial abnormality, there are chronic changes from prior right-sided craniotomy with underlying encephalomalacia but no evidence of bleeding.  Labs here show an INR of 6.9, PTT of 73, chronic leukopenia which is unchanged from baseline, hemoglobin 11.3, no acute electrolyte derangements and normal renal function.  Discussed with pharmacy and given patient's high risk with history of seizures, and unilateral BKA, will give 1 dose of vitamin K, pharmacy recommended 2.5 mg p.o., will have patient recheck INR and discuss with her doctor tomorrow prior to taking any additional doses of Coumadin.  Return precautions discussed.  Patient and mom expressed understanding and agreement with plan.  Stable for discharge home at this time.  Final Clinical Impressions(s) / ED Diagnoses   Final diagnoses:  Elevated INR  Chronic anticoagulation    ED Discharge Orders    None      Jacqlyn Larsen, [place] 01/04/18 1657  Duffy Bruce, MD 01/05/18 [phone]

## 2018-01-04 NOTE — ED Triage Notes (Signed)
Pt arrived via POV after her MD referred her to the ED for lab f/u after abnormal INR was resulted. Pt is alert and oriented x4. Pt has hx of seizures but does not report recent seizure activity.

## 2018-01-05 LAB — LEVETIRACETAM LEVEL: Levetiracetam Lvl: 74.9 ug/mL — ABNORMAL HIGH (ref 10.0–40.0)

## 2018-01-05 LAB — PROTIME-INR
INR: 6.94
PROTHROMBIN TIME: 58.8 s — AB (ref 11.4–15.2)

## 2018-01-05 LAB — LAMOTRIGINE LEVEL: Lamotrigine Lvl: 7.2 ug/mL (ref 2.0–20.0)

## 2018-01-10 ENCOUNTER — Other Ambulatory Visit: Payer: Self-pay | Admitting: Hematology

## 2018-01-10 DIAGNOSIS — I82419 Acute embolism and thrombosis of unspecified femoral vein: Secondary | ICD-10-CM

## 2018-01-10 NOTE — Progress Notes (Signed)
[place]  Telephone:([phone]) [phone] Fax:(336) [place] UP VISIT    ID: Ilana Prezioso OB: 06/12/1965 MR#: [id number] DDU#:[id number] Patient Care Team: Berkley Harvey, NP as PCP - General (Nurse Practitioner) Kathlee Nations, MD as Consulting Physician (Psychiatry)  REASON FOR OFFICE VISIT: 1. Bilateral lower extremity deep venous thrombosis.  2. Right below-knee amputation for osteogenic sarcoma.  3. Multiple miscarriages.  4. Positive lupus anticoagulant, repeated negative   PATIENT IDENTIFICATION  Cassandra Andrade is a pleasant 52 years old African-American female who is coming to established here at [facility]. Previously she was being seen by Dr. Burney Gauze at [facility] location. 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 She had radiation and chemotherapy. Should the surgery with the bone prosthesis. 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 It also causes her some memory problems. She was in coma for a while and she has a tracheostomy done and was placed in a long-term care facility. She has history of multiple miscarriages. She does have 3 children. She also had a right AKA because of the right lower leg infection Iin2013. 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 She was initially evaluated by Dr. Burney Gauze on 12/01/2012. She is wheelchair bound. She also have history of situational depression, iron deficiency anemia and hypothyroidism. 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 He also takes antiseizure therapy is no known family history of thromboembolic problems. She had couple of venous duplex studies done last one was on 09/19/2013 showing similar appearing residual chronic thrombus within the common femoral and femoral veins bilaterally. No significant interval change from 04/04/2013  patient's mother have a machine at home where they check her INR and the target INR is between 2-3. Her current Coumadin dose 10 mg daily and the results are being called out to physician who regulates the Coumadin. Patient and her mother would like to continue the same arrangement although I offered her that we have a dedicated Coumadin clinic and we can help in Coumadin regulation if they desire. Because of a positive lupus anticoagulant, a prior history of osteosarcoma, generalized deconditioning and wheelchair bound status, and presence of chronic thrombus on vascular imaging, Dr. Martha Clan recommended lifelong Coumadin.   CURRENT TREATMENT: Coumadin dose per PCP to maintain INR between 2-3   INTERIM HISTORY:  Cassandra Andrade is here for a follow up. She was last seen by me a year ago. She was unfortunately hospitalized for seizures earlier this month. Today, she is here with her family member. She is doing well. She has been checking her INR.      REVIEW OF SYSTEMS:   Constitutional: Denies fevers, chills or abnormal night sweats Eyes: Denies blurriness of vision, double vision or watery eyes Ears, nose, mouth, throat, and face: Denies mucositis or sore throat Respiratory: Denies cough, dyspnea or wheezes Cardiovascular: Denies palpitation, chest discomfort or lower extremity swelling Gastrointestinal:  Denies nausea, heartburn or change in bowel habits Skin: Denies abnormal skin rashes Lymphatics: Denies new lymphadenopathy or easy bruising Neurological:Denies numbness, tingling or new weaknesses Behavioral/Psych: Mood is stable, no new changes  All other systems were reviewed with the patient and are negative.  All other 10 point review of systems is negative.    PAST MEDICAL HISTORY: Past Medical History:  Diagnosis  Date  . Anxiety   . Arthritis   . Cancer (HCC)    Osteosarcoma, right leg  . CVA (cerebrovascular accident due to intracerebral hemorrhage) ([place]) 2004  . Depression    . DVT (deep venous thrombosis) (HCC)    Left leg  . MRSA (methicillin resistant Staphylococcus aureus)   . 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 Seizures ([place])   . Thyroid disease   . Unspecified epilepsy with intractable epilepsy 02/28/2013   PAST SURGICAL HISTORY: Past Surgical History:  Procedure Laterality Date  . ABOVE KNEE LEG AMPUTATION Right    Osteosarcoma  . BRAIN SURGERY  2004   cerebral aneurysm  . FRACTURE SURGERY     Rt. femur, folllowed by MRSA  . TOTAL KNEE ARTHROPLASTY Right    FAMILY HISTORY Family History  Problem Relation Age of Onset  . Anxiety disorder Mother   . Diabetes Father    GYNECOLOGIC HISTORY:  No LMP recorded. Patient has had an ablation.   SOCIAL HISTORY:  Social History   Socioeconomic History  . Marital status: Legally Separated    Spouse name: Not on file  . Number of children: 3  . Years of education: GED  . Highest education level: Not on file  Occupational History  . Not on file  Social Needs  . Financial resource strain: Not on file  . Food insecurity:    Worry: Not on file    Inability: Not on file  . Transportation needs:    Medical: Not on file    Non-medical: Not on file  Tobacco Use  . Smoking status: Never Smoker  . Smokeless tobacco: Never Used  . Tobacco comment: never used tobacco  Substance and Sexual Activity  . Alcohol use: No    Alcohol/week: 0.0 standard drinks  . Drug use: No  . Sexual activity: Not Currently  Lifestyle  . Physical activity:    Days per week: Not on file    Minutes per session: Not on file  . Stress: Not on file  Relationships  . Social connections:    Talks on phone: Not on file    Gets together: Not on file    Attends religious service: Not on file    Active member of club or organization: Not on file    Attends meetings of clubs or organizations: Not on file    Relationship status: Not on file  . Intimate partner violence:    Fear of current or ex  partner: Not on file    Emotionally abused: Not on file    Physically abused: Not on file    Forced sexual activity: Not on file  Other Topics Concern  . Not on file  Social History Narrative   Patient lives at home with mom.    Patient has GED.    Patient has 3 children.    Patient is married but in the process of divorced.       ADVANCED DIRECTIVES: <no information>  HEALTH MAINTENANCE: Social History   Tobacco Use  . Smoking status: Never Smoker  . Smokeless tobacco: Never Used  . Tobacco comment: never used tobacco  Substance Use Topics  . Alcohol use: No    Alcohol/week: 0.0 standard drinks  . Drug use: No   Colonoscopy: PAP: Bone density: Lipid panel:  Allergies  Allergen Reactions  . Vicodin [Hydrocodone-Acetaminophen] Nausea And Vomiting  . Morphine Rash  . Morphine And Related Rash  Allergic reaction only in iv form    Current Outpatient Medications  Medication Sig Dispense Refill  . acetaminophen (TYLENOL) 500 MG tablet Take 1,000 mg by mouth every 8 (eight) hours as needed for mild pain or headache.     . Ascorbic Acid (VITAMIN C) 1000 MG tablet Take 1,000 mg by mouth daily.     . Cholecalciferol (VITAMIN D) 50 MCG (2000 UT) CAPS Take 1 capsule by mouth daily.    . diazepam (DIASAT) 20 MG GEL Place 20 mg rectally daily as needed (Seizure). 1 Package 0  . ketoconazole (NIZORAL) 2 % shampoo Apply 1 application topically 2 (two) times a week.   1  . lacosamide (VIMPAT) 200 MG TABS tablet Take 1 tablet (200 mg total) by mouth 2 (two) times daily. 180 tablet 1  . lamoTRIgine (LAMICTAL) 200 MG tablet Take 1 tablet (200 mg total) by mouth 2 (two) times daily. 180 tablet 3  . lamoTRIgine (LAMICTAL) 25 MG tablet 2 TABLETS TWICE A DAY (Patient taking differently: Take 50 mg by mouth 2 (two) times daily. ) 360 tablet 3  . levETIRAcetam (KEPPRA) 1000 MG tablet Take 2 tablets (2,000 mg total) by mouth 2 (two) times daily.    [name] levothyroxine (SYNTHROID, LEVOTHROID)  50 MCG tablet Take 50 mcg by mouth daily before breakfast.    . Prenatal Vit-Fe Fumarate-FA (PRENATAL MULTIVITAMIN) TABS tablet Take 1 tablet by mouth daily at 12 noon.    . promethazine (PHENERGAN) 25 MG tablet Take 25 mg by mouth every 6 (six) hours as needed. nausea    . ranitidine (ZANTAC) 150 MG tablet Take 150 mg by mouth at bedtime.   2  . venlafaxine XR (EFFEXOR-XR) 150 MG 24 hr capsule TAKE 1 CAPSULE (150 MG TOTAL) BY MOUTH DAILY WITH BREAKFAST. 90 capsule 0  . warfarin (COUMADIN) 5 MG tablet Take 1.5-2 tablets (7.5-10 mg total) by mouth See admin instructions. Pt takes one and a half tablets (7.5mg  total) on Sunday and Wednesday and 2 tablets (10mg  total) all other days.     No current facility-administered medications for this visit.    OBJECTIVE: Vitals:   01/11/18 1139  BP: 121/76  Pulse: 95  Resp: 18  Temp: 98.3 F (36.8 C)  SpO2: 98%   Body mass index is 27 kg/m.  ECOG FS: 3 Ocular: Sclerae unicteric, pupils equal, round and reactive to light Ear-nose-throat: Oropharynx clear, dentition fair Lymphatic: No cervical or supraclavicular adenopathy Lungs no rales or rhonchi, good excursion bilaterally Heart regular rate and rhythm, no murmur appreciated Abd soft, nontender, positive bowel sounds MSK no focal spinal tenderness, no joint edema. (+) right AKA, small healed blister at the tip of her RLL Neuro: non-focal, well-oriented, appropriate affect Breasts: Deferred  LAB RESULTS: CBC Latest Ref Rng & Units 01/11/2018 01/04/2018 12/31/2017  WBC 4.0 - 10.5 K/uL 4.1 3.8(L) 3.5(L)  Hemoglobin 12.0 - 15.0 g/dL 12.0 11.3(L) 11.5(L)  Hematocrit 36.0 - 46.0 % 38.3 36.2 36.8  Platelets 150 - 400 K/uL 229 219 204    CMP Latest Ref Rng & Units 01/11/2018 01/04/2018 01/01/2018  Glucose 70 - 99 mg/dL 115(H) 89 97  BUN 6 - 20 mg/dL 14 12 9   Creatinine 0.44 - 1.00 mg/dL 0.98 0.88 0.96  Sodium 135 - 145 mmol/L 140 138 138  Potassium 3.5 - 5.1 mmol/L 3.4(L) 3.5 4.0  Chloride 98  - 111 mmol/L 102 102 104  CO2 22 - 32 mmol/L 30 28 27   Calcium 8.9 - 10.3 mg/dL  9.5 9.5 9.3  Total Protein 6.5 - 8.1 g/dL 7.6 7.2 6.9  Total Bilirubin 0.3 - 1.2 mg/dL 0.2(L) 0.3 0.2(L)  Alkaline Phos 38 - 126 U/L 144(H) 130(H) 114  AST 15 - 41 U/L 23 26 26   ALT 0 - 44 U/L 22 21 28     STUDIES: US Venous Img Lower Bilateral  09/19/2013   CLINICAL DATA:  Chronic bilateral DVT  EXAM: BILATERAL LOWER EXTREMITY VENOUS DOPPLER ULTRASOUND  TECHNIQUE: Gray-scale sonography with graded compression, as well as color Doppler and duplex ultrasound were performed to evaluate the lower extremity deep venous systems from the level of the common femoral vein and including the common femoral, femoral, profunda femoral, popliteal and calf veins including the posterior tibial, peroneal and gastrocnemius veins when visible. The superficial great saphenous vein was also interrogated. Spectral Doppler was utilized to evaluate flow at rest and with distal augmentation maneuvers in the common femoral, femoral and popliteal veins.  COMPARISON:  None.  FINDINGS: Deep veins: Bilateral lower extremity deep veins demonstrate similar recanalized thrombus with mural thrombus seen throughout both common femoral veins and the femoral veins of the thigh. No significant popliteal or tibial thrombus is identified on the left. The patient is status post amputation of the right lower leg.  Compressibility of deep veins: Partially compressible.  Duplex waveform respiratory phasicity:  Diminished but Normal.  Augmentation not performed.  Venous reflux: None visualized.  Other findings: No evidence of superficial thrombophlebitis or abnormal fluid collection.  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 No significant interval change.   Electronically Signed   By: Daryll Brod M.D.   On: 09/19/2013 12:26    ASSESSMENT/PLAN: Cassandra Andrade is a 52 y.o. African American  female.  1. History of bilateral DVT, transient Lupus anticoagulant positive, repeat tests were negative. -Her repeated Doppler showed chronic-appearing DVT in July 2015, which has not changed much. -She is wheelchair bound due to her right lower extremity amputation above knee. She has certainly high risks for thrombosis due to her immobilization.  -I think she likely need long-term anticoagulation, she agrees with the plan. She asked about other anticoagulation options such a Xeralto, and the pros and cons of Coumadin and Xeralto, especially their reversibility. After the extensive discussion, she decided to stay on Coumadin.  -She is following up with her primary care physician for Coumadin clinic. She checks INR at home  -Labs reviewed, CBC is WNLs. CMP pending.  -She is clinically doing well, we'll continue Coumadin, OK to hold Coumadin before procedure or surgery. I advised her to be careful with coumadin interaction with food and medications. -Due to her multiple medical issues, she has not been walking lately, we discussed the high risk of recurrent DVT due to the sedentary lifestyle.  I recommend her to continue Coumadin indefinitely.  2. Right lower extremity osteosarcoma, status post amputation above knee. -Doing well, no evidence of recurrence. She has a prosthesis but does not use often. -It has been 30 years since her diagnosis, no need for routine f/u   3. Mild anemia -Her hemoglobin was 11.4 in 10/2014, Iron study reviewed and normal ferritin and serum iron level. Her transferrin saturation is slightly low. -Anemia resolved. Hb 12.0 -I suggest she can take prenatal multivitamin.  4. Insomnia -I suggest melatonin or benadryl.  -I reviewed sleep hygiene with her.     Follow-up:  -she will continue Coumadin, monitoring and dose adjustment by her PCP  -I  will discharge her from my clinic, f/u as needed. She knows to call me if she has any concerns.    All questions were  answered and she is in agreement with the plan. She knows to call here with any questions or concerns and to go to the ED in the event of an emergency. We can certainly see her sooner if need be.   Dierdre Searles Dweik am acting as scribe for Dr. Truitt Merle.  I have reviewed the above documentation for accuracy and completeness, and I agree with the above.    Truitt Merle  01/11/2018

## 2018-01-11 ENCOUNTER — Inpatient Hospital Stay: Payer: Medicare Other

## 2018-01-11 ENCOUNTER — Encounter: Payer: Self-pay | Admitting: Hematology

## 2018-01-11 ENCOUNTER — Inpatient Hospital Stay: Payer: Medicare Other | Attending: Hematology | Admitting: Hematology

## 2018-01-11 VITALS — BP 121/76 | HR 95 | Temp 98.3°F | Resp 18 | Ht 67.5 in

## 2018-01-11 DIAGNOSIS — Z9221 Personal history of antineoplastic chemotherapy: Secondary | ICD-10-CM | POA: Diagnosis not present

## 2018-01-11 DIAGNOSIS — I82419 Acute embolism and thrombosis of unspecified femoral vein: Secondary | ICD-10-CM

## 2018-01-11 DIAGNOSIS — Z7901 Long term (current) use of anticoagulants: Secondary | ICD-10-CM

## 2018-01-11 DIAGNOSIS — Z85830 Personal history of malignant neoplasm of bone: Secondary | ICD-10-CM

## 2018-01-11 DIAGNOSIS — Z89511 Acquired absence of right leg below knee: Secondary | ICD-10-CM

## 2018-01-11 DIAGNOSIS — Z923 Personal history of irradiation: Secondary | ICD-10-CM

## 2018-01-11 DIAGNOSIS — Z993 Dependence on wheelchair: Secondary | ICD-10-CM | POA: Diagnosis not present

## 2018-01-11 DIAGNOSIS — F3341 Major depressive disorder, recurrent, in partial remission: Secondary | ICD-10-CM

## 2018-01-11 DIAGNOSIS — Z86718 Personal history of other venous thrombosis and embolism: Secondary | ICD-10-CM | POA: Diagnosis not present

## 2018-01-11 LAB — CBC WITH DIFFERENTIAL/PLATELET
ABS IMMATURE GRANULOCYTES: 0.01 10*3/uL (ref 0.00–0.07)
BASOS ABS: 0 10*3/uL (ref 0.0–0.1)
BASOS PCT: 1 %
Eosinophils Absolute: 0 10*3/uL (ref 0.0–0.5)
Eosinophils Relative: 1 %
HCT: 38.3 % (ref 36.0–46.0)
HEMOGLOBIN: 12 g/dL (ref 12.0–15.0)
IMMATURE GRANULOCYTES: 0 %
LYMPHS PCT: 50 %
Lymphs Abs: 2 10*3/uL (ref 0.7–4.0)
MCH: 28.5 pg (ref 26.0–34.0)
MCHC: 31.3 g/dL (ref 30.0–36.0)
MCV: 91 fL (ref 80.0–100.0)
Monocytes Absolute: 0.3 10*3/uL (ref 0.1–1.0)
Monocytes Relative: 7 %
NEUTROS ABS: 1.7 10*3/uL (ref 1.7–7.7)
NEUTROS PCT: 41 %
NRBC: 0 % (ref 0.0–0.2)
PLATELETS: 229 10*3/uL (ref 150–400)
RBC: 4.21 MIL/uL (ref 3.87–5.11)
RDW: 12.8 % (ref 11.5–15.5)
WBC: 4.1 10*3/uL (ref 4.0–10.5)

## 2018-01-11 LAB — CMP (CANCER CENTER ONLY)
ALT: 22 U/L (ref 0–44)
ANION GAP: 8 (ref 5–15)
AST: 23 U/L (ref 15–41)
Albumin: 3.9 g/dL (ref 3.5–5.0)
Alkaline Phosphatase: 144 U/L — ABNORMAL HIGH (ref 38–126)
BILIRUBIN TOTAL: 0.2 mg/dL — AB (ref 0.3–1.2)
BUN: 14 mg/dL (ref 6–20)
CHLORIDE: 102 mmol/L (ref 98–111)
CO2: 30 mmol/L (ref 22–32)
Calcium: 9.5 mg/dL (ref 8.9–10.3)
Creatinine: 0.98 mg/dL (ref 0.44–1.00)
GFR, Est AFR Am: >60 mL/min (ref >60)
Glucose, Bld: 115 mg/dL — ABNORMAL HIGH (ref 70–99)
Potassium: 3.4 mmol/L — ABNORMAL LOW (ref 3.5–5.1)
Sodium: 140 mmol/L (ref 135–145)
TOTAL PROTEIN: 7.6 g/dL (ref 6.5–8.1)

## 2018-01-17 ENCOUNTER — Other Ambulatory Visit: Payer: Self-pay | Admitting: Neurology

## 2018-01-18 DIAGNOSIS — Z86718 Personal history of other venous thrombosis and embolism: Secondary | ICD-10-CM | POA: Diagnosis not present

## 2018-01-24 ENCOUNTER — Ambulatory Visit (INDEPENDENT_AMBULATORY_CARE_PROVIDER_SITE_OTHER): Payer: Medicare Other | Admitting: Psychiatry

## 2018-01-24 DIAGNOSIS — F321 Major depressive disorder, single episode, moderate: Secondary | ICD-10-CM

## 2018-01-24 MED ORDER — VENLAFAXINE HCL ER 150 MG PO CP24: 150.0000 mg | ORAL_CAPSULE | Freq: Every day | ORAL | 0 refills | Status: DC

## 2018-01-24 NOTE — Progress Notes (Signed)
[place] MD/PA/NP OP Progress Note  01/24/2018 3:56 PM Cassandra Andrade  MRN:  [id number]  Chief Complaint: I could not come because I had a seizure.  But I am doing good.  HPI: Patient came for her follow-up appointment with her mother.  She was last seen in February.  She missed appointment because she has a seizure and she was hospitalized.  She recently seen neurologist and there were no changes.  Overall she describes her anxiety and depression is a stable.  She is sleeping good.  She denies any irritability, anger, mania or any psychosis.  Her energy level is okay.  Patient told that her younger sister who moved in with them but decided to move out because it was not a good fit with them.  Patient excited because her kids came from [place] to visit her on Thanksgiving.  And she is hoping that they will come on Christmas.  She admitted not able to use prosthesis because it is uncomfortable but she does not want to give up and keep trying.  Patient like to continue her current medication.  She has no tremors, shakes or any EPS.  I reviewed her blood work results from recent hospitalization.    Visit Diagnosis:    ICD-10-CM   1. Major depressive disorder, single episode, moderate (HCC) F32.1 venlafaxine XR (EFFEXOR-XR) 150 MG 24 hr capsule    Past Psychiatric History: Reviewed. History of psychiatric admission in 2011 due to overdose on her pain medication.  No history of mania or psychosis.  Tried Zoloft and Remeron but do not remember.  Took Lexapro and Abilify but stopped due to lack of response.  Prozac worked for a long time until it stopped working.  Past Medical History:  Past Medical History:  Diagnosis Date  . Anxiety   . Arthritis   . Cancer (HCC)    Osteosarcoma, right leg  . CVA (cerebrovascular accident due to intracerebral hemorrhage) ([place]) 2004  . Depression   . DVT (deep venous thrombosis) (HCC)    Left leg  . MRSA (methicillin resistant Staphylococcus aureus)   . S/P BKA  (below knee amputation) unilateral (HCC)    Right   . Seizures ([place])   . Thyroid disease   . 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 ABOVE KNEE LEG AMPUTATION Right    Osteosarcoma  . BRAIN SURGERY  2004   cerebral aneurysm  . FRACTURE SURGERY     Rt. femur, folllowed by MRSA  . TOTAL KNEE ARTHROPLASTY Right     Family Psychiatric History: Viewed.  Family History:  Family History  Problem Relation Age of Onset  . Anxiety disorder Mother   . Diabetes Father     Social History:  Social History   Socioeconomic History  . Marital status: Legally Separated    Spouse name: Not on file  . Number of children: 3  . Years of education: GED  . Highest education level: Not on file  Occupational History  . Not on file  Social Needs  . Financial resource strain: Not on file  . Food insecurity:    Worry: Not on file    Inability: Not on file  . Transportation needs:    Medical: Not on file    Non-medical: Not on file  Tobacco Use  . Smoking status: Never Smoker  . Smokeless tobacco: Never Used  . Tobacco comment: never used tobacco  Substance and Sexual Activity  .  Alcohol use: No    Alcohol/week: 0.0 standard drinks  . Drug use: No  . Sexual activity: Not Currently  Lifestyle  . Physical activity:    Days per week: Not on file    Minutes per session: Not on file  . Stress: Not on file  Relationships  . Social connections:    Talks on phone: Not on file    Gets together: Not on file    Attends religious service: Not on file    Active member of club or organization: Not on file    Attends meetings of clubs or organizations: Not on file    Relationship status: Not on file  Other Topics Concern  . Not on file  Social History Narrative   Patient lives at home with mom.    Patient has GED.    Patient has 3 children.    Patient is married but in the process of divorced.        Allergies:   Allergies  Allergen Reactions  . Vicodin [Hydrocodone-Acetaminophen] Nausea And Vomiting  . Morphine Rash  . Morphine And Related Rash    Allergic reaction only in iv form    Metabolic Disorder Labs: Lab Results  Component Value Date   HGBA1C (L) 12/26/2009    4.3 (NOTE)                                                                       According to the ADA Clinical Practice Recommendations for 2011, when HbA1c is used as a screening test:   >=6.5%   Diagnostic of Diabetes Mellitus           (if abnormal result  is confirmed)  5.7-6.4%   Increased risk of developing Diabetes Mellitus  References:Diagnosis and Classification of Diabetes Mellitus,Diabetes RSWN,4627,03(JKKXF 1):S62-S69 and Standards of Medical Care in         Diabetes - 2011,Diabetes Care,2011,34  (Suppl 1):S11-S61.   MPG 77 12/26/2009   Lab Results  Component Value Date   PROLACTIN 16.8 02/17/2013   No results found for: CHOL, TRIG, HDL, CHOLHDL, VLDL, LDLCALC Lab Results  Component Value Date   TSH 2.417 02/17/2013   TSH 3.771 02/16/2010    Therapeutic Level Labs: No results found for: LITHIUM No results found for: VALPROATE No components found for:  CBMZ  Current Medications: Current Outpatient Medications  Medication Sig Dispense Refill  . acetaminophen (TYLENOL) 500 MG tablet Take 1,000 mg by mouth every 8 (eight) hours as needed for mild pain or headache.     . Ascorbic Acid (VITAMIN C) 1000 MG tablet Take 1,000 mg by mouth daily.     . Cholecalciferol (VITAMIN D) 50 MCG (2000 UT) CAPS Take 1 capsule by mouth daily.    . diazepam (DIASAT) 20 MG GEL Place 20 mg rectally daily as needed (Seizure). 1 Package 0  . ketoconazole (NIZORAL) 2 % shampoo Apply 1 application topically 2 (two) times a week.   1  . lacosamide (VIMPAT) 200 MG TABS tablet Take 1 tablet (200 mg total) by mouth 2 (two) times daily. 180 tablet 1  . lamoTRIgine (LAMICTAL) 200 MG tablet Take 1 tablet (200 mg total) by mouth 2 (two)  times daily. 180 tablet 3  .  lamoTRIgine (LAMICTAL) 25 MG tablet 2 TABLETS TWICE A DAY (Patient taking differently: Take 50 mg by mouth 2 (two) times daily. ) 360 tablet 3  . levETIRAcetam (KEPPRA) 1000 MG tablet TAKE 2 TABLETS (2,000 MG TOTAL) BY MOUTH 2 (TWO) TIMES DAILY. 360 tablet 3  . levothyroxine (SYNTHROID, LEVOTHROID) 50 MCG tablet Take 50 mcg by mouth daily before breakfast.    . Prenatal Vit-Fe Fumarate-FA (PRENATAL MULTIVITAMIN) TABS tablet Take 1 tablet by mouth daily at 12 noon.    . promethazine (PHENERGAN) 25 MG tablet Take 25 mg by mouth every 6 (six) hours as needed. nausea    . ranitidine (ZANTAC) 150 MG tablet Take 150 mg by mouth at bedtime.   2  . venlafaxine XR (EFFEXOR-XR) 150 MG 24 hr capsule TAKE 1 CAPSULE (150 MG TOTAL) BY MOUTH DAILY WITH BREAKFAST. 90 capsule 0  . warfarin (COUMADIN) 5 MG tablet Take 1.5-2 tablets (7.5-10 mg total) by mouth See admin instructions. Pt takes one and a half tablets (7.5mg  total) on Sunday and Wednesday and 2 tablets (10mg  total) all other days.     No current facility-administered medications for this visit.      Musculoskeletal: Strength & Muscle Tone: decreased Gait & Station: use wheelchair Patient leans: N/A  Psychiatric Specialty Exam: ROS  Blood pressure 122/74, pulse 70, height 5' 7.5" (1.715 m), weight 160 lb (72.6 kg).There is no height or weight on file to calculate BMI.  General Appearance: Casual  Eye Contact:  Fair  Speech:  Slow  Volume:  Normal  Mood:  Anxious  Affect:  Appropriate  Thought Process:  Descriptions of Associations: Intact  Orientation:  Full (Time, Place, and Person)  Thought Content: Rumination   Suicidal Thoughts:  No  Homicidal Thoughts:  No  Memory:  Immediate;   Fair Recent;   Fair Remote;   Fair  Judgement:  Intact  Insight:  Present  Psychomotor Activity:  Normal  Concentration:  Concentration: Fair and Attention Span: Fair  Recall:  Good  Fund of Knowledge: Good  Language: Good   Akathisia:  No  Handed:  Right  AIMS (if indicated): not done  Assets:  Communication Skills Desire for Improvement Housing Resilience Social Support  ADL's:  Intact  Cognition: WNL  Sleep:  Fair   Screenings: GAD-7     Counselor from 03/24/2015 in [place]  Total GAD-7 Score  4    PHQ2-9     Counselor from 06/11/2015 in [facility] Counselor from 03/24/2015 in [facility] Office Visit from 05/16/2013 in [facility]  PHQ-2 Total Score  4  1  0  PHQ-9 Total Score  9  1  -       Assessment and Plan: Major depressive disorder, recurrent.  She is a stable on Effexor.  She has no side effects.  I reviewed blood work results from recent hospitalization.  Continue Effexor XR 150 mg daily.  Encouraged to restart therapy with Janett Billow for CBT.  Recommended to call us back if she has any question or any concern.  Follow-up in 3 months.   Kathlee Nations, MD 01/24/2018, 3:56 PM

## 2018-02-15 DIAGNOSIS — Z86718 Personal history of other venous thrombosis and embolism: Secondary | ICD-10-CM | POA: Diagnosis not present

## 2018-02-28 ENCOUNTER — Encounter (HOSPITAL_COMMUNITY): Payer: Self-pay | Admitting: Licensed Clinical Social Worker

## 2018-02-28 ENCOUNTER — Ambulatory Visit (INDEPENDENT_AMBULATORY_CARE_PROVIDER_SITE_OTHER): Payer: Medicare Other | Admitting: Licensed Clinical Social Worker

## 2018-02-28 DIAGNOSIS — F331 Major depressive disorder, recurrent, moderate: Secondary | ICD-10-CM | POA: Diagnosis not present

## 2018-02-28 NOTE — Progress Notes (Signed)
   THERAPIST PROGRESS NOTE  Session Time: 3:30pm-4:30pm  Participation Level: Active  Behavioral Response: CasualAlertAnxious  Type of Therapy: Individual Therapy  Treatment Goals addressed: improve psychiatric symptoms, elevate mood, Improve unhelpful thought patterns, interpersonal relationship skills, healthy coping skills    Interventions: CBT and Motivational Interviewing, Grounding and Mindfulness Techniques, psychoeducation   Summary: Ekam Bonebrake is a 53 y.o. female who presents with Major Depressive Disorder, recurrent, moderate  Suicidal/Homicidal: No -without intent/plan  Therapist Response: Cadee met with clinician for an individual session. Tayloranne discussed her psychiatric symptoms, her current life events and her homework. She shared ongoing issues with feeling guilty and like a burden to her mother. She reports concerns that her mother is not well and not happy. However, she reports mother has never said these things. Clinician explored the changes in point of view, due to last year feeling happy and confident. Clinician explored level of depression at this time and noted that there has been an increase. Clinician utilized CBT to identify coping skills. Marisah reported she felt her memory is slipping and she wanted to do something to boost this memory. Clinician explored ability to learn new information and identified options for taking a class online or in a community college setting. Shatasia identified cost as a concern, but reported she could take time now to research options for getting financial aid or scholarships.    Plan: Return again in 3-4 weeks.  Diagnosis: Axis I:   Major Depressive Disorder, recurrent, moderate   [name], LCSW 02/28/2018

## 2018-03-03 ENCOUNTER — Emergency Department (HOSPITAL_COMMUNITY)
Admission: EM | Admit: 2018-03-03 | Discharge: 2018-03-03 | Disposition: A | Discharge status: Home / Self Care | Payer: Medicare Other | Attending: Emergency Medicine | Admitting: Emergency Medicine

## 2018-03-03 ENCOUNTER — Encounter (HOSPITAL_COMMUNITY): Payer: Self-pay | Admitting: *Deleted

## 2018-03-03 ENCOUNTER — Emergency Department (HOSPITAL_COMMUNITY): Payer: Medicare Other

## 2018-03-03 ENCOUNTER — Other Ambulatory Visit: Payer: Self-pay

## 2018-03-03 DIAGNOSIS — Z7901 Long term (current) use of anticoagulants: Secondary | ICD-10-CM | POA: Diagnosis not present

## 2018-03-03 DIAGNOSIS — R569 Unspecified convulsions: Secondary | ICD-10-CM | POA: Insufficient documentation

## 2018-03-03 DIAGNOSIS — R51 Headache: Secondary | ICD-10-CM | POA: Insufficient documentation

## 2018-03-03 DIAGNOSIS — Z85830 Personal history of malignant neoplasm of bone: Secondary | ICD-10-CM | POA: Diagnosis not present

## 2018-03-03 DIAGNOSIS — Z79899 Other long term (current) drug therapy: Secondary | ICD-10-CM | POA: Diagnosis not present

## 2018-03-03 DIAGNOSIS — E079 Disorder of thyroid, unspecified: Secondary | ICD-10-CM | POA: Insufficient documentation

## 2018-03-03 DIAGNOSIS — R0902 Hypoxemia: Secondary | ICD-10-CM | POA: Diagnosis not present

## 2018-03-03 DIAGNOSIS — Z86718 Personal history of other venous thrombosis and embolism: Secondary | ICD-10-CM | POA: Diagnosis not present

## 2018-03-03 DIAGNOSIS — G40909 Epilepsy, unspecified, not intractable, without status epilepticus: Secondary | ICD-10-CM | POA: Diagnosis not present

## 2018-03-03 DIAGNOSIS — R531 Weakness: Secondary | ICD-10-CM | POA: Diagnosis not present

## 2018-03-03 DIAGNOSIS — R110 Nausea: Secondary | ICD-10-CM | POA: Diagnosis not present

## 2018-03-03 DIAGNOSIS — R457 State of emotional shock and stress, unspecified: Secondary | ICD-10-CM | POA: Diagnosis not present

## 2018-03-03 LAB — URINALYSIS, ROUTINE W REFLEX MICROSCOPIC
Bilirubin Urine: NEGATIVE
Glucose, UA: NEGATIVE mg/dL
Ketones, ur: NEGATIVE mg/dL
Nitrite: NEGATIVE
PH: 5 (ref 5.0–8.0)
Protein, ur: NEGATIVE mg/dL
SPECIFIC GRAVITY, URINE: 1.025 (ref 1.005–1.030)

## 2018-03-03 LAB — CBC WITH DIFFERENTIAL/PLATELET
Abs Immature Granulocytes: 0 10*3/uL (ref 0.00–0.07)
Basophils Absolute: 0 10*3/uL (ref 0.0–0.1)
Basophils Relative: 1 %
EOS PCT: 2 %
Eosinophils Absolute: 0.1 10*3/uL (ref 0.0–0.5)
HCT: 37.1 % (ref 36.0–46.0)
Hemoglobin: 11.5 g/dL — ABNORMAL LOW (ref 12.0–15.0)
Immature Granulocytes: 0 %
Lymphocytes Relative: 57 %
Lymphs Abs: 2.1 10*3/uL (ref 0.7–4.0)
MCH: 29 pg (ref 26.0–34.0)
MCHC: 31 g/dL (ref 30.0–36.0)
MCV: 93.5 fL (ref 80.0–100.0)
Monocytes Absolute: 0.3 10*3/uL (ref 0.1–1.0)
Monocytes Relative: 7 %
Neutro Abs: 1.2 10*3/uL — ABNORMAL LOW (ref 1.7–7.7)
Neutrophils Relative %: 33 %
PLATELETS: 180 10*3/uL (ref 150–400)
RBC: 3.97 MIL/uL (ref 3.87–5.11)
RDW: 13.1 % (ref 11.5–15.5)
WBC: 3.7 10*3/uL — ABNORMAL LOW (ref 4.0–10.5)
nRBC: 0 % (ref 0.0–0.2)

## 2018-03-03 LAB — COMPREHENSIVE METABOLIC PANEL
ALT: 18 U/L (ref 0–44)
ANION GAP: 10 (ref 5–15)
AST: 26 U/L (ref 15–41)
Albumin: 3.8 g/dL (ref 3.5–5.0)
Alkaline Phosphatase: 76 U/L (ref 38–126)
BUN: 8 mg/dL (ref 6–20)
CHLORIDE: 102 mmol/L (ref 98–111)
CO2: 29 mmol/L (ref 22–32)
Calcium: 9.2 mg/dL (ref 8.9–10.3)
Creatinine, Ser: 1.04 mg/dL — ABNORMAL HIGH (ref 0.44–1.00)
GFR calc Af Amer: >60 mL/min (ref >60)
GFR calc non Af Amer: >60 mL/min (ref >60)
Glucose, Bld: 108 mg/dL — ABNORMAL HIGH (ref 70–99)
Potassium: 3.4 mmol/L — ABNORMAL LOW (ref 3.5–5.1)
Sodium: 141 mmol/L (ref 135–145)
Total Bilirubin: 0.5 mg/dL (ref 0.3–1.2)
Total Protein: 6.7 g/dL (ref 6.5–8.1)

## 2018-03-03 LAB — MAGNESIUM: Magnesium: 1.7 mg/dL (ref 1.7–2.4)

## 2018-03-03 LAB — CBG MONITORING, ED: GLUCOSE-CAPILLARY: 107 mg/dL — AB (ref 70–99)

## 2018-03-03 LAB — PHOSPHORUS: Phosphorus: 2.2 mg/dL — ABNORMAL LOW (ref 2.5–4.6)

## 2018-03-03 MED ORDER — LEVETIRACETAM IN NACL 1000 MG/100ML IV SOLN
1000.0000 mg | Freq: Once | INTRAVENOUS | Status: AC
  Administered 2018-03-03: 1000 mg via INTRAVENOUS
  Filled 2018-03-03: qty 100

## 2018-03-03 MED ORDER — ONDANSETRON HCL 4 MG/2ML IJ SOLN
4.0000 mg | Freq: Once | INTRAMUSCULAR | Status: AC
  Administered 2018-03-03: 4 mg via INTRAVENOUS
  Filled 2018-03-03: qty 2

## 2018-03-03 MED ORDER — LACOSAMIDE 200 MG PO TABS
200.0000 mg | ORAL_TABLET | Freq: Two times a day (BID) | ORAL | Status: DC
  Administered 2018-03-03: 200 mg via ORAL
  Filled 2018-03-03: qty 4

## 2018-03-03 MED ORDER — SODIUM CHLORIDE 0.9 % IV BOLUS
500.0000 mL | Freq: Once | INTRAVENOUS | Status: AC
  Administered 2018-03-03: 500 mL via INTRAVENOUS

## 2018-03-03 MED ORDER — LAMOTRIGINE 100 MG PO TABS
200.0000 mg | ORAL_TABLET | Freq: Two times a day (BID) | ORAL | Status: DC
  Administered 2018-03-03: 200 mg via ORAL
  Filled 2018-03-03: qty 2

## 2018-03-03 NOTE — ED Triage Notes (Signed)
Pt here via [facility] for motor focal seizures per ems.  Pt was seizing at home x 20 minutes and seizure stopped after 2.5 versed iv.  Vs 110/60, hr 90, rr 16 cbg 134.  Pt states her head hurts, she feels anxious.

## 2018-03-03 NOTE — Discharge Instructions (Signed)
It was my pleasure taking care of you today!   Please call your neurologist to schedule a follow-up appointment.  Return to the emergency department for another seizure in the next 48 hours, new or worsening symptoms, any additional concerns.

## 2018-03-03 NOTE — ED Provider Notes (Signed)
[facility] EMERGENCY DEPARTMENT Provider Note   CSN: [id number] Arrival date & time: 03/03/18  1439     History   Chief Complaint Chief Complaint  Patient presents with  . Seizures    HPI Cassandra Andrade is a 53 y.o. female.  The history is provided by the patient and medical records. No language interpreter was used.  Seizures   Cassandra Andrade is a 53 y.o. female  with a PMH of prior CVA, prior DVT on coumadin, chronic seizure disorder on multiple medications who presents to the Emergency Department for evaluation after seizure just prior to arrival.  Patient states that she may have had a seizure this morning.  She believes that her mom told her this, but is unsure.  She does not remember having a seizure this morning.  She did take her medications this morning and reports compliance with medications recently.  She states that she accidentally smashed her knees and the foot rest of the recliner.  This was very distressing to her.  She states that her niece was fine, but she went to the bathroom because she was very upset about the incident.  While she was in the bathroom, she apparently had a seizure.  She typically will have prodromal symptoms beforehand, but did not today.  Her nurse aide found her and called EMS.  Per EMS, she was still seizing at home and had been about 20 minutes.  She was given 2.5 of Versed which made her seizure stopped.  Currently, she states that she feels nauseous with a mild headache and very tired.  This is consistent with her typical postictal symptoms.  She denies any other additional complaints.  No recent illness.  She feels as if her seizure today was elicited due to her emotional state and anxiety about incident with her niece today.   Past Medical History:  Diagnosis Date  . Anxiety   . Arthritis   . Cancer (HCC)    Osteosarcoma, right leg  . CVA (cerebrovascular accident due to intracerebral hemorrhage) ([place]) 2004  . Depression     . DVT (deep venous thrombosis) (HCC)    Left leg  . MRSA (methicillin resistant Staphylococcus aureus)   . 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 Seizures ([place])   . Thyroid disease   . 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 History of deep venous thrombosis 05/05/2015  . Personal history of osteosarcoma 05/05/2015  . Anemia 11/04/2014  . Abnormal finding on mammography 05/18/2014  . Major depression, recurrent ([place]) 03/19/2014  . Vision disturbance 02/28/2014  . Postprocedural state 12/31/2013  . History of biliary T-tube placement 12/31/2013  . Lupus anticoagulant disorder ([place]) 12/20/2013  . Back pain, chronic 11/07/2013  . Absence of bladder continence 08/05/2013  . History of anticoagulant therapy 07/17/2013  . Long term current use of anticoagulant therapy 07/17/2013  . Partial epilepsy with impairment of consciousness ([place]) 06/21/2013  . Intractable epilepsy ([place]) 02/28/2013  . Absolute anemia 02/28/2013  . Seizures ([place]) 02/21/2013  . Seizure ([place]) 02/17/2013  . DVT, bilateral lower limbs ([place]) 11/07/2012  . Hypothyroid 07/04/2012  . Seizure disorder ([place]) 07/04/2012  . Chronic pain syndrome 07/04/2012  . S/P BKA (below knee amputation) ([place]) 07/04/2012  . Chronic pain associated with significant psychosocial dysfunction 07/04/2012  . Cerebrovascular accident, late effects 06/05/2012  . Status post above knee amputation  06/08/2011  . Mechanical complication of graft of bone, cartilage, muscle or tendon 05/10/2011  . Depression 05/03/2011  . 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 ABOVE KNEE LEG AMPUTATION Right    Osteosarcoma  . BRAIN SURGERY  2004   cerebral aneurysm  . FRACTURE SURGERY     Rt. femur, folllowed by MRSA  . TOTAL KNEE ARTHROPLASTY Right      OB History   No obstetric history  on file.      Home Medications    Prior to Admission medications   Medication Sig Start Date End Date Taking? Authorizing Provider  acetaminophen (TYLENOL) 500 MG tablet Take 1,000 mg by mouth every 8 (eight) hours as needed for mild pain or headache.    Yes [provider]  Ascorbic Acid (VITAMIN C) 1000 MG tablet Take 1,000 mg by mouth daily.  08/17/11  Yes [provider]  Cholecalciferol (VITAMIN D) 50 MCG (2000 UT) CAPS Take 1 capsule by mouth daily.   Yes [provider]  diazepam (DIASAT) 20 MG GEL Place 20 mg rectally daily as needed (Seizure). 01/01/18  Yes Thurnell Lose, MD  lacosamide (VIMPAT) 200 MG TABS tablet Take 1 tablet (200 mg total) by mouth 2 (two) times daily. 11/10/17  Yes Kathrynn Ducking, MD  lamoTRIgine (LAMICTAL) 200 MG tablet Take 1 tablet (200 mg total) by mouth 2 (two) times daily. 05/04/17  Yes Kathrynn Ducking, MD  lamoTRIgine (LAMICTAL) 25 MG tablet 2 TABLETS TWICE A DAY Patient taking differently: Take 25 mg by mouth 2 (two) times daily.  05/04/17  Yes Kathrynn Ducking, MD  levothyroxine (SYNTHROID, LEVOTHROID) 50 MCG tablet Take 50 mcg by mouth daily before breakfast.   Yes [provider]  Prenatal Vit-Fe Fumarate-FA (PRENATAL MULTIVITAMIN) TABS tablet Take 1 tablet by mouth daily at 12 noon.   Yes [provider]  promethazine (PHENERGAN) 25 MG tablet Take 25 mg by mouth every 6 (six) hours as needed for nausea.    Yes [provider]  ranitidine (ZANTAC) 150 MG tablet Take 150 mg by mouth 2 (two) times daily.  03/25/17  Yes [provider]  venlafaxine XR (EFFEXOR-XR) 150 MG 24 hr capsule Take 1 capsule (150 mg total) by mouth daily with breakfast. 01/24/18  Yes Arfeen, Arlyce Harman, MD  warfarin (COUMADIN) 5 MG tablet Take 1.5-2 tablets (7.5-10 mg total) by mouth See admin instructions. Pt takes one and a half tablets (7.5mg  total) on Sunday and Wednesday and 2 tablets (10mg  total) all other  days. Patient taking differently: Take 7.5-10 mg by mouth See admin instructions. Pt takes 7.5mg  on Sunday and Wednesday and 10mg  all other days. 01/03/18  Yes Thurnell Lose, MD  levETIRAcetam (KEPPRA) 1000 MG tablet TAKE 2 TABLETS (2,000 MG TOTAL) BY MOUTH 2 (TWO) TIMES DAILY. Patient taking differently: Take 2,000 mg by mouth 2 (two) times daily.  01/17/18   Kathrynn Ducking, MD    Family History Family History  Problem Relation Age of Onset  . Anxiety disorder Mother   . Diabetes Father     Social History Social History   Tobacco Use  . Smoking status: Never Smoker  . Smokeless tobacco: Never Used  . Tobacco comment: never used tobacco  Substance Use Topics  . Alcohol use: No    Alcohol/week: 0.0 standard drinks  . Drug use: No     Allergies   Vicodin [hydrocodone-acetaminophen] and  Morphine and related   Review of Systems Review of Systems  Gastrointestinal: Positive for nausea. Negative for abdominal pain and vomiting.  Neurological: Positive for seizures.  All other systems reviewed and are negative.    Physical Exam Updated Vital Signs BP 116/72   Pulse 82   Temp 98.7 F (37.1 C) (Oral)   Resp 15   Ht 5' 7.5" (1.715 m)   Wt 79.4 kg   SpO2 100%   BMI 27.00 kg/m   Physical Exam Vitals signs and nursing note reviewed.  Constitutional:      General: She is not in acute distress.    Appearance: She is well-developed.  HENT:     Head: Normocephalic and atraumatic.     Comments: No evidence of tongue biting.  Cardiovascular:     Rate and Rhythm: Normal rate and regular rhythm.     Heart sounds: Normal heart sounds. No murmur.  Pulmonary:     Effort: Pulmonary effort is normal. No respiratory distress.     Breath sounds: Normal breath sounds.  Abdominal:     General: There is no distension.     Palpations: Abdomen is soft.     Tenderness: There is no abdominal tenderness.  Skin:    General: Skin is warm and dry.  Neurological:     Mental  Status: She is alert and oriented to person, place, and time.     Comments: Alert, oriented, thought content appropriate, able to give a coherent history. Speech is clear and goal oriented, able to follow commands.  Cranial Nerves:  II:  Peripheral visual fields to the left worse than right - patient states this is baseline, pupils equal, round, reactive to light III, IV, VI: EOM intact bilaterally, ptosis not present V,VII: smile symmetric, eyes kept closed tightly against resistance, facial light touch sensation equal VIII: hearing grossly normal IX, X: symmetric soft palate movement, uvula elevates symmetrically  XI: bilateral shoulder shrug symmetric and strong XII: midline tongue extension 5/5 muscle strength in upper and lower extremities bilaterally including strong and equal grip strength and dorsiflexion/plantar flexion Sensory to light touch normal in all four extremities.  Normal finger-to-nose and rapid alternating movements.      ED Treatments / Results  Labs (all labs ordered are listed, but only abnormal results are displayed) Labs Reviewed  CBC WITH DIFFERENTIAL/PLATELET - Abnormal; Notable for the following components:      Result Value   WBC 3.7 (*)    Hemoglobin 11.5 (*)    Neutro Abs 1.2 (*)    All other components within normal limits  COMPREHENSIVE METABOLIC PANEL - Abnormal; Notable for the following components:   Potassium 3.4 (*)    Glucose, Bld 108 (*)    Creatinine, Ser 1.04 (*)    All other components within normal limits  PHOSPHORUS - Abnormal; Notable for the following components:   Phosphorus 2.2 (*)    All other components within normal limits  URINALYSIS, ROUTINE W REFLEX MICROSCOPIC - Abnormal; Notable for the following components:   Hgb urine dipstick SMALL (*)    Leukocytes, UA TRACE (*)    Bacteria, UA FEW (*)    All other components within normal limits  CBG MONITORING, ED - Abnormal; Notable for the following components:    Glucose-Capillary 107 (*)    All other components within normal limits  MAGNESIUM  LEVETIRACETAM LEVEL  LAMOTRIGINE LEVEL    EKG EKG Interpretation  Date/Time:  Friday March 03 2018 14:46:10 EST Ventricular Rate:  93 PR Interval:    QRS Duration: 103 QT Interval:  351 QTC Calculation: 437 R Axis:   57 Text Interpretation:  Sinus rhythm Borderline prolonged PR interval ST elevation, consider inferior injury No significant change since last tracing Confirmed by Wandra Arthurs [phone]) on 03/03/2018 3:21:43 PM   Radiology Ct Head Wo Contrast  Result Date: 03/03/2018 CLINICAL DATA:  Seizures. EXAM: CT HEAD WITHOUT CONTRAST TECHNIQUE: Contiguous axial images were obtained from the base of the skull through the vertex without intravenous contrast. COMPARISON:  01/04/2018 FINDINGS: Brain: There is no evidence for acute hemorrhage, hydrocephalus, mass lesion, or abnormal extra-axial fluid collection. No definite CT evidence for acute infarction. Right temporal lobe encephalomalacia extends into the right frontal region with ex vacuo dilatation of the right lateral ventricle, findings stable since prior study. Overlying craniotomy defect is compatible with prior surgery. Vascular: No hyperdense vessel or unexpected calcification. Skull: Right frontotemporal craniotomy defect again noted. Sinuses/Orbits: The visualized paranasal sinuses and mastoid air cells are clear. Visualized portions of the globes and intraorbital fat are unremarkable. Other: None. IMPRESSION: 1. Stable.  No acute intracranial abnormality. 2. Stable postsurgical changes and encephalomalacia in the right frontotemporal region. Electronically Signed   By: Misty Stanley M.D.   On: 03/03/2018 17:29    Procedures Procedures (including critical care time)  Medications Ordered in ED Medications  lacosamide (VIMPAT) tablet 200 mg (200 mg Oral Given 03/03/18 1735)  lamoTRIgine (LAMICTAL) tablet 200 mg (200 mg Oral Given 03/03/18 1819)   ondansetron (ZOFRAN) injection 4 mg (4 mg Intravenous Given 03/03/18 1529)  levETIRAcetam (KEPPRA) IVPB 1000 mg/100 mL premix (0 mg Intravenous Stopped 03/03/18 1725)  sodium chloride 0.9 % bolus 500 mL (0 mLs Intravenous Stopped 03/03/18 1814)     Initial Impression / Assessment and Plan / ED Course  I have reviewed the triage vital signs and the nursing notes.  Pertinent labs & imaging results that were available during my care of the patient were reviewed by me and considered in my medical decision making (see chart for details).    Cassandra Andrade is a 53 y.o. female known seizure disorder who presents to the emergency department after having seizure at home today lasting about 15 minutes.  Was given Versed IV which stopped her seizure.  No new neurologic deficits appreciated.  CT head showed no acute findings.  Labs reassuring.  No infectious symptoms to suggest inciting event.  Monitored in the ER for several hours without seizure.  Family at bedside reports she is back to her baseline and are staying with her tonight.  She would like to go home and I feel this is reasonable.  She will call her neurologist on Monday to schedule follow-up appointment.  She has enough of all of her seizure medications and I stressed importance of compliance.  She agrees.  Reasons to return to the emergency department were discussed with patient and family at bedside.  All questions answered.  Patient discussed with Dr. Darl Householder who agrees with treatment plan.    Final Clinical Impressions(s) / ED Diagnoses   Final diagnoses:  Seizure [facility])    ED Discharge Orders    None       Nehemyah Foushee, Ozella Almond, PA-C 03/03/18 1826    Drenda Freeze, MD 03/05/18 1721

## 2018-03-06 LAB — LAMOTRIGINE LEVEL: Lamotrigine Lvl: 2.7 ug/mL (ref 2.0–20.0)

## 2018-03-08 ENCOUNTER — Telehealth: Payer: Self-pay | Admitting: Neurology

## 2018-03-08 LAB — LEVETIRACETAM LEVEL: Levetiracetam Lvl: <1 ug/mL — ABNORMAL LOW (ref 10.0–40.0)

## 2018-03-08 NOTE — Telephone Encounter (Signed)
I called the mother.  The patient was in the hospital on 03 March 2018.  Patient had a seizure.  Blood work shows a Keppra level of 0, the level was 75 in November.  The Lamictal level went from 7.2 to a level of 2.7.  The patient likely has not been taking medications properly, she had no Keppra in her system at the time of the seizure.  The patient is also on Vimpat.   I will get a revisit for the patient, we will recheck levels in a couple weeks.

## 2018-03-08 NOTE — Telephone Encounter (Signed)
Pt. Mother [name] (on [place]) called to inform us that pt was in the hospital on 03/03/18 due to seizures. [name] states that the hospital called her to inform her that the pt is taking too little of the levETIRAcetam (KEPPRA) 1000 MG tablet  Pt. Had less than 1.0 in her system when they tested her. Please advise.

## 2018-03-09 NOTE — Telephone Encounter (Addendum)
I contacted the patient's mom(Janice) and was able to schedule a f/u with Dr. [name] on 03/23/18 for a 12 pm appt with a arrival time of 11:30 am.

## 2018-03-15 DIAGNOSIS — Z86718 Personal history of other venous thrombosis and embolism: Secondary | ICD-10-CM | POA: Diagnosis not present

## 2018-03-23 ENCOUNTER — Ambulatory Visit (INDEPENDENT_AMBULATORY_CARE_PROVIDER_SITE_OTHER): Payer: Medicare Other | Admitting: Neurology

## 2018-03-23 ENCOUNTER — Encounter: Payer: Self-pay | Admitting: Neurology

## 2018-03-23 VITALS — BP 120/82 | HR 87 | Ht 67.5 in | Wt 177.0 lb

## 2018-03-23 DIAGNOSIS — Z5181 Encounter for therapeutic drug level monitoring: Secondary | ICD-10-CM

## 2018-03-23 DIAGNOSIS — G40909 Epilepsy, unspecified, not intractable, without status epilepticus: Secondary | ICD-10-CM

## 2018-03-23 DIAGNOSIS — G40919 Epilepsy, unspecified, intractable, without status epilepticus: Secondary | ICD-10-CM

## 2018-03-23 MED ORDER — MIDAZOLAM 5 MG/0.1ML NA SOLN: 0.1000 mL | Freq: Once | NASAL | 3 refills | Status: DC | PRN

## 2018-03-23 NOTE — Progress Notes (Signed)
Reason for visit: Intractable seizures  Cassandra Andrade is an 53 y.o. female  History of present illness:  Cassandra Andrade is a 53 year old right-handed black female with a history of intractable seizures.  The patient has a history of a cerebral aneurysm, she has encephalomalacia involving the right hemisphere.  The patient has undergone video EEG monitoring and was told that she was inducing her seizure events, epilepsy surgery was not indicated.  The patient is had a vagal nerve stimulator placed, she continues to have very frequent seizures.  The patient went to the emergency room on 03 March 2018 with a prolonged seizure requiring Versed.  Blood work in the emergency room showed that the Barrera level was 0, the Lamictal level was much lower than her usual level, the level was 2.7 and usually she runs anywhere from 7-11.  Vimpat level was not checked.  The patient oftentimes has seizures if she gets upset about something.  She returns to this office for an evaluation.  She continues to complain of some mild memory problems.  Past Medical History:  Diagnosis Date  . Anxiety   . Arthritis   . Cancer (HCC)    Osteosarcoma, right leg  . CVA (cerebrovascular accident due to intracerebral hemorrhage) ([place]) 2004  . Depression   . DVT (deep venous thrombosis) (HCC)    Left leg  . MRSA (methicillin resistant Staphylococcus aureus)   . 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 Seizures ([place])   . Thyroid disease   . 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 ABOVE KNEE LEG AMPUTATION Right    Osteosarcoma  . BRAIN SURGERY  2004   cerebral aneurysm  . FRACTURE SURGERY     Rt. femur, folllowed by MRSA  . TOTAL KNEE ARTHROPLASTY Right     Family History  Problem Relation Age of Onset  . Anxiety disorder Mother   . Diabetes Father     Social history:  reports that she has never smoked. She has  never used smokeless tobacco. She reports that she does not drink alcohol or use drugs.    Allergies  Allergen Reactions  . Vicodin [Hydrocodone-Acetaminophen] Nausea And Vomiting  . Morphine And Related Rash    Allergic reaction only in iv form    Medications:  Prior to Admission medications   Medication Sig Start Date End Date Taking? Authorizing Provider  acetaminophen (TYLENOL) 500 MG tablet Take 1,000 mg by mouth every 8 (eight) hours as needed for mild pain or headache.    Yes [provider]  Ascorbic Acid (VITAMIN C) 1000 MG tablet Take 1,000 mg by mouth daily.  08/17/11  Yes [provider]  Cholecalciferol (VITAMIN D) 50 MCG (2000 UT) CAPS Take 1 capsule by mouth daily.   Yes [provider]  diazepam (DIASAT) 20 MG GEL Place 20 mg rectally daily as needed (Seizure). 01/01/18  Yes Thurnell Lose, MD  lacosamide (VIMPAT) 200 MG TABS tablet Take 1 tablet (200 mg total) by mouth 2 (two) times daily. 11/10/17  Yes Kathrynn Ducking, MD  lamoTRIgine (LAMICTAL) 200 MG tablet Take 1 tablet (200 mg total) by mouth 2 (two) times daily. 05/04/17  Yes Kathrynn Ducking, MD  lamoTRIgine (LAMICTAL) 25 MG tablet 2 TABLETS TWICE A DAY Patient taking differently: Take 25 mg by mouth 2 (two) times daily.  05/04/17  Yes Kathrynn Ducking, MD  levETIRAcetam (KEPPRA) 1000 MG tablet TAKE 2 TABLETS (2,000 MG TOTAL) BY MOUTH 2 (TWO) TIMES DAILY. Patient taking differently: Take 2,000 mg by mouth 2 (two) times daily.  01/17/18  Yes Kathrynn Ducking, MD  levothyroxine (SYNTHROID, LEVOTHROID) 50 MCG tablet Take 50 mcg by mouth daily before breakfast.   Yes [provider]  Prenatal Vit-Fe Fumarate-FA (PRENATAL MULTIVITAMIN) TABS tablet Take 1 tablet by mouth daily at 12 noon.   Yes [provider]  promethazine (PHENERGAN) 25 MG tablet Take 25 mg by mouth every 6 (six) hours as needed for nausea.    Yes [provider]  ranitidine (ZANTAC) 150 MG tablet  Take 150 mg by mouth 2 (two) times daily.  03/25/17  Yes [provider]  venlafaxine XR (EFFEXOR-XR) 150 MG 24 hr capsule Take 1 capsule (150 mg total) by mouth daily with breakfast. 01/24/18  Yes Arfeen, Arlyce Harman, MD  warfarin (COUMADIN) 5 MG tablet Take 1.5-2 tablets (7.5-10 mg total) by mouth See admin instructions. Pt takes one and a half tablets (7.5mg  total) on Sunday and Wednesday and 2 tablets (10mg  total) all other days. Patient taking differently: Take 7.5-10 mg by mouth See admin instructions. Pt takes 7.5mg  on Sunday and Wednesday and 10mg  all other days. 01/03/18  Yes Thurnell Lose, MD    ROS:  Out of a complete 14 system review of symptoms, the patient complains only of the following symptoms, and all other reviewed systems are negative.  Incontinence of the bladder, frequency of urination, decreased urine Bruising easily Memory loss, seizures Depression, anxiety, suicidal thoughts  Blood pressure 120/82, pulse 87, height 5' 7.5" (1.715 m), weight 177 lb (80.3 kg), SpO2 98 %.  Physical Exam  General: The patient is alert and cooperative at the time of the examination.  Skin: No significant peripheral edema is noted.   Neurologic Exam  Mental status: The patient is alert and oriented x 3 at the time of the examination. The patient has apparent normal recent and remote memory, with an apparently normal attention span and concentration ability.   Cranial nerves: Facial symmetry is present. Speech is normal, no aphasia or dysarthria is noted. Extraocular movements are full. Visual fields are notable for a mild left superior quadrantanopsia.  Motor: The patient has good strength in all 4 extremities.  The patient has a prosthetic right leg, above-knee amputation.  Sensory examination: Soft touch sensation is symmetric on the face, arms, and legs.  Coordination: The patient has good finger-nose-finger and heel-to-shin bilaterally.  Gait and station: The patient  walks with a walker.  Reflexes: Deep tendon reflexes are symmetric.   CT head 03/03/18:  IMPRESSION: 1. Stable.  No acute intracranial abnormality. 2. Stable postsurgical changes and encephalomalacia in the right frontotemporal region.  * CT scan images were reviewed online. I agree with the written report.    Assessment/Plan:  1.  History of aneurysm, right brain encephalomalacia  2.  Intractable seizures  3.  Reports of memory disturbance  4.  Gait disturbance  On the most recent ER visit, the drug levels of her antiepileptic medications have clearly been altered, she has been missing doses apparently.  The patient and the mother denied this.  The patient will be sent for blood work again today to document the blood levels.  They will stop the rectal diazepam, we will use intranasal Versed for prolonged seizures in the future.   Jill Alexanders MD 03/23/2018 12:08 PM  [facility] [address]  [place], [place] [address]  Phone [phone] Fax [phone]

## 2018-03-27 LAB — LACOSAMIDE: Lacosamide: 13.2 ug/mL — ABNORMAL HIGH (ref 5.0–10.0)

## 2018-03-27 LAB — LEVETIRACETAM LEVEL: Levetiracetam Lvl: 81.3 ug/mL — ABNORMAL HIGH (ref 10.0–40.0)

## 2018-03-27 LAB — LAMOTRIGINE LEVEL: LAMOTRIGINE LVL: 12.1 ug/mL (ref 2.0–20.0)

## 2018-04-06 ENCOUNTER — Ambulatory Visit (HOSPITAL_COMMUNITY): Payer: Medicare Other | Admitting: Licensed Clinical Social Worker

## 2018-04-12 ENCOUNTER — Encounter (HOSPITAL_COMMUNITY): Payer: Self-pay | Admitting: Licensed Clinical Social Worker

## 2018-04-12 ENCOUNTER — Ambulatory Visit (INDEPENDENT_AMBULATORY_CARE_PROVIDER_SITE_OTHER): Payer: Medicare Other | Admitting: Licensed Clinical Social Worker

## 2018-04-12 DIAGNOSIS — F330 Major depressive disorder, recurrent, mild: Secondary | ICD-10-CM

## 2018-04-12 DIAGNOSIS — Z86718 Personal history of other venous thrombosis and embolism: Secondary | ICD-10-CM | POA: Diagnosis not present

## 2018-04-13 ENCOUNTER — Encounter (HOSPITAL_COMMUNITY): Payer: Self-pay | Admitting: Licensed Clinical Social Worker

## 2018-04-13 NOTE — Progress Notes (Signed)
   THERAPIST PROGRESS NOTE  Session Time: 2:30pm-3:30pm  Participation Level: Active  Behavioral Response: CasualAlertEuthymic  Type of Therapy: Individual Therapy  Treatment Goals addressed: improve psychiatric symptoms, elevate mood, Improve unhelpful thought patterns, interpersonal relationship skills, learn about diagnosis, healthy coping skills    Interventions: CBT and Motivational Interviewing, Grounding and Mindfulness Techniques, psychoeducation   Summary: Lemoyne Scarpati is a 53 y.o. female who presents with Major Depressive Disorder, recurrent, mild  Suicidal/Homicidal: No -without intent/plan  Therapist Response: Letrice met with clinician for an individual session. Ammy discussed her psychiatric symptoms, her current life events and her homework. She shared that due to her aid having to go back to school, she is now using her mother as her full-time aid. Clinician explored how this was going and discussed options for other day programs for Tailey to get involved with. Clinician provided information for [facility] and encouraged her to reach out and get involved. Clinician also identified that mom will need to contact social security to sign up to be a paid caregiver.  Cherine reports overall positive outlook and goal to walk on her own without a walker, but using a cane. Clinician explored options for returning to physical therapy and possibly OT.    Plan: Return again in 4 weeks.  Diagnosis: Axis I:   Major Depressive Disorder, recurrent, mild  [name], LCSW 04/13/2018

## 2018-04-25 ENCOUNTER — Ambulatory Visit (HOSPITAL_COMMUNITY): Payer: Medicare Other | Admitting: Psychiatry

## 2018-05-01 ENCOUNTER — Ambulatory Visit (INDEPENDENT_AMBULATORY_CARE_PROVIDER_SITE_OTHER): Payer: Medicare Other | Admitting: Licensed Clinical Social Worker

## 2018-05-01 ENCOUNTER — Encounter (HOSPITAL_COMMUNITY): Payer: Self-pay | Admitting: Licensed Clinical Social Worker

## 2018-05-01 DIAGNOSIS — F330 Major depressive disorder, recurrent, mild: Secondary | ICD-10-CM

## 2018-05-01 NOTE — Progress Notes (Signed)
   THERAPIST PROGRESS NOTE  Session Time: 2:30pm-3:30pm  Participation Level: Active  Behavioral Response: CasualAlertEuthymic  Type of Therapy: Individual Therapy  Treatment Goals addressed: improve psychiatric symptoms, elevate mood, Improve unhelpful thought patterns, interpersonal relationship skills, learn about diagnosis, healthy coping skills    Interventions: CBT and Motivational Interviewing, Grounding and Mindfulness Techniques, psychoeducation   Summary: Cassandra Andrade is a 53 y.o. female who presents with Major Depressive Disorder, recurrent, mild  Suicidal/Homicidal: No -without intent/plan  Therapist Response: Cassandra Andrade met with clinician for an individual session. Cassandra Andrade discussed her psychiatric symptoms, her current life events and her homework. She shared that things have been going well since mom became her caregiver. Clinician explored changes and noted that mom got hired by the company they used for CNAs to care for Cassandra Andrade. Mom is now a paid provider for Cassandra Andrade, which has helped their family financially, as well as helped boost mom's self-esteem and confidence. Clinician assisted Cassandra Andrade in processing her own thoughts and feelings about having mom as her aid. Cassandra Andrade reported feeling happy about it and very comfortable in this arrangement.  Clinician explored Cassandra Andrade's mood and interactions with others. Clinician also validated and provided some coping skills around feeling depressed sometimes, coping with self-esteem issues, and being able to do more for herself.    Plan: Return again in 4 weeks.  Diagnosis: Axis I:   Major Depressive Disorder, recurrent, mild  [name], LCSW 05/01/2018

## 2018-05-09 ENCOUNTER — Other Ambulatory Visit (HOSPITAL_COMMUNITY): Payer: Self-pay

## 2018-05-09 DIAGNOSIS — F321 Major depressive disorder, single episode, moderate: Secondary | ICD-10-CM

## 2018-05-09 MED ORDER — VENLAFAXINE HCL ER 150 MG PO CP24: 150.0000 mg | ORAL_CAPSULE | Freq: Every day | ORAL | 0 refills | Status: DC

## 2018-05-10 DIAGNOSIS — Z86718 Personal history of other venous thrombosis and embolism: Secondary | ICD-10-CM | POA: Diagnosis not present

## 2018-05-13 ENCOUNTER — Other Ambulatory Visit: Payer: Self-pay | Admitting: Neurology

## 2018-05-15 ENCOUNTER — Ambulatory Visit (INDEPENDENT_AMBULATORY_CARE_PROVIDER_SITE_OTHER): Payer: Medicare Other | Admitting: Licensed Clinical Social Worker

## 2018-05-15 ENCOUNTER — Encounter (HOSPITAL_COMMUNITY): Payer: Self-pay | Admitting: Licensed Clinical Social Worker

## 2018-05-15 ENCOUNTER — Encounter (HOSPITAL_COMMUNITY): Payer: Self-pay | Admitting: Psychiatry

## 2018-05-15 ENCOUNTER — Other Ambulatory Visit: Payer: Self-pay

## 2018-05-15 DIAGNOSIS — F321 Major depressive disorder, single episode, moderate: Secondary | ICD-10-CM | POA: Diagnosis not present

## 2018-05-15 NOTE — Progress Notes (Signed)
   THERAPIST PROGRESS NOTE  Session Time: 3:30pm-4:30pm  Participation Level: Active  Behavioral Response: NeatAlertDepressed  Type of Therapy: Individual Therapy  Treatment Goals addressed: improve psychiatric symptoms, elevate mood, Improve unhelpful thought patterns, interpersonal relationship skills, learn about diagnosis, healthy coping skills    Interventions: CBT and Motivational Interviewing, Grounding and Mindfulness Techniques, psychoeducation   Summary: Neftaly Inzunza is a 53 y.o. female who presents with Major Depressive Disorder, recurrent, moderate  Suicidal/Homicidal: No -without intent/plan  Therapist Response: Adilenne met with clinician for an individual session. Belita discussed her psychiatric symptoms, her current life events and her homework. She shared ongoing disappointment in working toward her goal of retiring her wheelchair. She reports she has not been wearing her prosthetic leg and she has made minimal progress in walking and doing things on her own. Clinician validated frustration and encouraged Norvell to engage in self compassion, as well as some motivation to work on it daily. Clinician assigned Greenleigh to "befriend" her leg and to spend time with it daily. Clinician identified goal to wear the leg daily for at least a few hours per day and whenever she goes out of the house. Clinician also challenged Yomira to do more for herself.    Plan: Return again in 2-3 weeks.  Diagnosis: Axis I:   Major Depressive Disorder, recurrent, moderate   [name], LCSW 05/15/2018

## 2018-05-18 ENCOUNTER — Ambulatory Visit (INDEPENDENT_AMBULATORY_CARE_PROVIDER_SITE_OTHER): Payer: Medicare Other | Admitting: Psychiatry

## 2018-05-18 ENCOUNTER — Other Ambulatory Visit: Payer: Self-pay

## 2018-05-18 DIAGNOSIS — F321 Major depressive disorder, single episode, moderate: Secondary | ICD-10-CM | POA: Diagnosis not present

## 2018-05-18 DIAGNOSIS — F419 Anxiety disorder, unspecified: Secondary | ICD-10-CM

## 2018-05-18 MED ORDER — VENLAFAXINE HCL ER 150 MG PO CP24: 150.0000 mg | ORAL_CAPSULE | Freq: Every day | ORAL | 0 refills | Status: DC

## 2018-05-18 NOTE — Progress Notes (Signed)
Virtual Visit via Telephone Note  I connected with Goldy Cristobal on 05/18/18 at  1:40 PM EDT by telephone and verified that I am speaking with the correct person using two identifiers.   I discussed the limitations, risks, security and privacy concerns of performing an evaluation and management service by telephone and the availability of in person appointments. I also discussed with the patient that there may be a patient responsible charge related to this service. The patient expressed understanding and agreed to proceed.   History of Present Illness: Patient was evaluated through phone session.  She is doing better on her current medication.  She like Effexor.  She is seeing Janett Billow for therapy.  She is pleased that her seizures are not as frequent and as intense.  Her anxiety is much better.  Her appetite is okay.  She denies any irritability, panic attack or any feeling of hopelessness.  She admitted lately concerned about pandemic coronavirus but she is not leaving outside.  She is in contact with her good friends and she usually chat with her most of the time.  She is also trying to use prosthesis every day and trying to learn walk slowly and gradually.  She does not want to change medication.  She has no paranoia or any suicidal thoughts.  Her appetite is okay.   Mental status examination: Limited mental status examination done on the phone.  She describes her mood is good.  Her speech is fast but clear coherent and logical.  There were no flight of ideas or any loose association.  She denies any paranoia, hallucination, suicidal thoughts or homicidal thought.  She does not appear to be distracted on phone conversation.  She is alert and oriented x3.  Her fund of knowledge is adequate.  Her insight and judgment is good.  Assessment and Plan: Major depressive disorder, recurrent.  Anxiety.  Patient like to continue her current medication.  She has no side effects.  She appears to be stable on  Effexor.  I will continue Effexor XR 150 mg daily.  Encouraged to continue therapy with Janett Billow for CBT.  Recommended to call us back if she is any question, concern if you feel worsening of her symptoms.  I will see her again in 3 months.  Follow Up Instructions:    I discussed the assessment and treatment plan with the patient. The patient was provided an opportunity to ask questions and all were answered. The patient agreed with the plan and demonstrated an understanding of the instructions.   The patient was advised to call back or seek an in-person evaluation if the symptoms worsen or if the condition fails to improve as anticipated.  I provided 15 minutes of non-face-to-face time during this encounter.   Kathlee Nations, MD

## 2018-05-23 ENCOUNTER — Other Ambulatory Visit: Payer: Self-pay | Admitting: Neurology

## 2018-05-24 NOTE — Telephone Encounter (Signed)
Drug registry checked; last fill 02-17-2018 #180.  Has F/u appt 06-01-18 with Dr. [name].

## 2018-05-30 ENCOUNTER — Other Ambulatory Visit: Payer: Self-pay

## 2018-05-30 ENCOUNTER — Encounter (HOSPITAL_COMMUNITY): Payer: Self-pay | Admitting: Licensed Clinical Social Worker

## 2018-05-30 ENCOUNTER — Ambulatory Visit (INDEPENDENT_AMBULATORY_CARE_PROVIDER_SITE_OTHER): Payer: Medicare Other | Admitting: Licensed Clinical Social Worker

## 2018-05-30 DIAGNOSIS — F330 Major depressive disorder, recurrent, mild: Secondary | ICD-10-CM

## 2018-05-30 NOTE — Progress Notes (Signed)
Virtual Visit via Telephone Note  I connected with Cassandra Andrade on 05/30/18 at  2:30 PM EDT by telephone and verified that I am speaking with the correct person using two identifiers.   I discussed the limitations, risks, security and privacy concerns of performing an evaluation and management service by telephone and the availability of in person appointments. I also discussed with the patient that there may be a patient responsible charge related to this service. The patient expressed understanding and agreed to proceed.   Type of Therapy: Individual Therapy   Treatment Goals addressed: improve psychiatric symptoms, elevate mood, Improve unhelpful thought patterns, interpersonal relationship skills, learn about diagnosis, healthy coping skills    Interventions: CBT and Motivational Interviewing, Grounding and Mindfulness Techniques, psychoeducation   Summary: Cassandra Andrade is a 52 y.o. female who presents with Major Depressive Disorder, recurrent, mild   Suicidal/Homicidal: No -without intent/plan   Therapist Response: Cassandra Andrade met with clinician for an individual session. Cassandra Andrade discussed her psychiatric symptoms, her current life events and her homework. She shared that things are going pretty well. She identified ongoing lack of motivation to wear her prosthetic leg. However, she reports that she wants to learn to walk with the leg. Clinician challenged those thoughts and utilized MI OARS to reflect and assist in positive change talk. Clinician discussed ways to remind her to wear the leg on a daily basis, such as keeping the leg propped up near the bed to remind her to put it on before she gets out of bed daily. Clinician also discussed the importance of intentional visualization in order to motivate herself to walk with the leg.     Plan: Return again in 2-3 weeks.   Diagnosis: Axis I: Major Depressive Disorder, recurrent, mild   I discussed the assessment and treatment plan with the  patient. The patient was provided an opportunity to ask questions and all were answered. The patient agreed with the plan and demonstrated an understanding of the instructions.   The patient was advised to call back or seek an in-person evaluation if the symptoms worsen or if the condition fails to improve as anticipated.  I provided 30 minutes of non-face-to-face time during this encounter.    R , LCSW  

## 2018-06-01 ENCOUNTER — Other Ambulatory Visit: Payer: Self-pay

## 2018-06-01 ENCOUNTER — Ambulatory Visit (INDEPENDENT_AMBULATORY_CARE_PROVIDER_SITE_OTHER): Payer: Medicare Other | Admitting: Neurology

## 2018-06-01 ENCOUNTER — Encounter: Payer: Self-pay | Admitting: Neurology

## 2018-06-01 DIAGNOSIS — G40919 Epilepsy, unspecified, intractable, without status epilepticus: Secondary | ICD-10-CM

## 2018-06-01 MED ORDER — LAMOTRIGINE 25 MG PO TABS: ORAL_TABLET | ORAL | 3 refills | Status: DC

## 2018-06-01 NOTE — Progress Notes (Signed)
     Virtual Visit via Video Note  I connected with Cassandra Andrade on 06/01/18 at 11:00 AM EDT by a video enabled telemedicine application and verified that I am speaking with the correct person using two identifiers.   I discussed the limitations of evaluation and management by telemedicine and the availability of in person appointments. The patient expressed understanding and agreed to proceed.  History of Present Illness: Cassandra Andrade is a 53 year old right-handed black female with a history of intractable seizures.  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  The patient currently is on Vimpat, Lamictal, and Keppra, she still has ongoing seizures, she has had 4 seizures since seen in the end of January 2020.  Last seizure was this morning.  The patient will have mild tremors, she is able to activate her vagal nerve stimulator.  She reports no significant side effects on the antiepileptic medications, she is not drowsy or having significant cognitive slowing.  The patient reports that she has some problems with depression, she just recently had an adjustment to her Effexor dosing.  The patient is eating and drinking well, she states that she sleeps well at night.  She has not had any falls, she does have a prosthetic limb and walks with a walker.  Overall, she feels that her seizures have been under relatively good control.   Observations/Objective: The patient is alert and cooperative, speech is well enunciated, not aphasic or dysarthric.  Extraocular movements are full.  The patient has good facial symmetry, she is able to protrude the tongue in midline with good lateral movement of the tongue.  Good finger-nose-finger is noted bilaterally.  Assessment and Plan: 1.  Intractable seizures  2.  Depression  3.  History of cerebral aneurysm, right brain encephalomalacia  The patient will be maintained on her current doses.  The patient was  seen in the emergency room in January 2020, she apparently had not been taking her medications properly.  Repeat blood work showed excellent levels of her drugs on the same dosing.  The patient will be maintained on her antiepileptic medications, she will follow-up here in 6 months.  A prescription for the 25 mg Lamictal tablets were sent in.  Follow Up Instructions: The patient will follow-up in 6 months, may see nurse practitioner.   I discussed the assessment and treatment plan with the patient. The patient was provided an opportunity to ask questions and all were answered. The patient agreed with the plan and demonstrated an understanding of the instructions.   The patient was advised to call back or seek an in-person evaluation if the symptoms worsen or if the condition fails to improve as anticipated.  I provided 15 minutes of non-face-to-face time during this encounter.   Kathrynn Ducking, MD

## 2018-06-07 DIAGNOSIS — Z86718 Personal history of other venous thrombosis and embolism: Secondary | ICD-10-CM | POA: Diagnosis not present

## 2018-06-15 ENCOUNTER — Encounter (HOSPITAL_COMMUNITY): Payer: Self-pay | Admitting: Licensed Clinical Social Worker

## 2018-06-15 ENCOUNTER — Other Ambulatory Visit: Payer: Self-pay

## 2018-06-15 ENCOUNTER — Ambulatory Visit (INDEPENDENT_AMBULATORY_CARE_PROVIDER_SITE_OTHER): Payer: Medicare Other | Admitting: Licensed Clinical Social Worker

## 2018-06-15 DIAGNOSIS — F330 Major depressive disorder, recurrent, mild: Secondary | ICD-10-CM

## 2018-06-15 NOTE — Progress Notes (Signed)
Virtual Visit via Telephone Note  I connected with Cassandra Andrade on 06/15/18 at  2:30 PM EDT by telephone and verified that I am speaking with the correct person using two identifiers.   I discussed the limitations, risks, security and privacy concerns of performing an evaluation and management service by telephone and the availability of in person appointments. I also discussed with the patient that there may be a patient responsible charge related to this service. The patient expressed understanding and agreed to proceed.   Type of Therapy: Individual Therapy   Treatment Goals addressed: improve psychiatric symptoms, elevate mood, Improve unhelpful thought patterns, interpersonal relationship skills, learn about diagnosis, healthy coping skills    Interventions: CBT and Motivational Interviewing, Grounding and Mindfulness Techniques, psychoeducation   Summary: Cassandra Andrade is a 53 y.o. female who presents with Major Depressive Disorder, recurrent, mild  Suicidal/Homicidal: No -without intent/plan   Therapist Response: Cassandra Andrade met with clinician for an individual session. Cassandra Andrade discussed her psychiatric symptoms, her current life events and her homework. She shared that she has been demotivated lately, not interested in putting on her leg and has not been getting out and about much in several days. Clinician assessed for depression and noted no significant concerns, other than lack of motivation and tiredness. Clinician explored motivating factors and encouraged Cassandra Andrade to go put on her leg for the rest of the evening and try to do some walking around the house, atleast for a few hours per day. Clinician explored interactions with mom and noted that things continue to go well with mom as her caregiver.    Plan: Return again in 2-3 weeks.   Diagnosis: Axis I: Major Depressive Disorder, recurrent, mild    I discussed the assessment and treatment plan with the patient. The patient was provided  an opportunity to ask questions and all were answered. The patient agreed with the plan and demonstrated an understanding of the instructions.   The patient was advised to call back or seek an in-person evaluation if the symptoms worsen or if the condition fails to improve as anticipated.  I provided 20 minutes of non-face-to-face time during this encounter.   [name], LCSW

## 2018-06-28 DIAGNOSIS — Z1231 Encounter for screening mammogram for malignant neoplasm of breast: Secondary | ICD-10-CM | POA: Diagnosis not present

## 2018-06-28 DIAGNOSIS — Z1211 Encounter for screening for malignant neoplasm of colon: Secondary | ICD-10-CM | POA: Diagnosis not present

## 2018-06-28 DIAGNOSIS — Z1239 Encounter for other screening for malignant neoplasm of breast: Secondary | ICD-10-CM | POA: Diagnosis not present

## 2018-06-28 DIAGNOSIS — G40219 Localization-related (focal) (partial) symptomatic epilepsy and epileptic syndromes with complex partial seizures, intractable, without status epilepticus: Secondary | ICD-10-CM | POA: Diagnosis not present

## 2018-06-28 DIAGNOSIS — Z7901 Long term (current) use of anticoagulants: Secondary | ICD-10-CM | POA: Diagnosis not present

## 2018-06-28 DIAGNOSIS — F332 Major depressive disorder, recurrent severe without psychotic features: Secondary | ICD-10-CM | POA: Diagnosis not present

## 2018-06-28 DIAGNOSIS — D649 Anemia, unspecified: Secondary | ICD-10-CM | POA: Diagnosis not present

## 2018-06-28 DIAGNOSIS — Z89611 Acquired absence of right leg above knee: Secondary | ICD-10-CM | POA: Diagnosis not present

## 2018-07-01 DIAGNOSIS — Z9689 Presence of other specified functional implants: Secondary | ICD-10-CM | POA: Diagnosis not present

## 2018-07-01 DIAGNOSIS — Z9181 History of falling: Secondary | ICD-10-CM | POA: Diagnosis not present

## 2018-07-01 DIAGNOSIS — Z7901 Long term (current) use of anticoagulants: Secondary | ICD-10-CM | POA: Diagnosis not present

## 2018-07-01 DIAGNOSIS — M549 Dorsalgia, unspecified: Secondary | ICD-10-CM | POA: Diagnosis not present

## 2018-07-01 DIAGNOSIS — F332 Major depressive disorder, recurrent severe without psychotic features: Secondary | ICD-10-CM | POA: Diagnosis not present

## 2018-07-01 DIAGNOSIS — Z89611 Acquired absence of right leg above knee: Secondary | ICD-10-CM | POA: Diagnosis not present

## 2018-07-01 DIAGNOSIS — G8929 Other chronic pain: Secondary | ICD-10-CM | POA: Diagnosis not present

## 2018-07-01 DIAGNOSIS — Z8673 Personal history of transient ischemic attack (TIA), and cerebral infarction without residual deficits: Secondary | ICD-10-CM | POA: Diagnosis not present

## 2018-07-01 DIAGNOSIS — Z86718 Personal history of other venous thrombosis and embolism: Secondary | ICD-10-CM | POA: Diagnosis not present

## 2018-07-01 DIAGNOSIS — Z85830 Personal history of malignant neoplasm of bone: Secondary | ICD-10-CM | POA: Diagnosis not present

## 2018-07-01 DIAGNOSIS — Z79899 Other long term (current) drug therapy: Secondary | ICD-10-CM | POA: Diagnosis not present

## 2018-07-01 DIAGNOSIS — D6862 Lupus anticoagulant syndrome: Secondary | ICD-10-CM | POA: Diagnosis not present

## 2018-07-01 DIAGNOSIS — G40219 Localization-related (focal) (partial) symptomatic epilepsy and epileptic syndromes with complex partial seizures, intractable, without status epilepticus: Secondary | ICD-10-CM | POA: Diagnosis not present

## 2018-07-05 DIAGNOSIS — G8929 Other chronic pain: Secondary | ICD-10-CM | POA: Diagnosis not present

## 2018-07-05 DIAGNOSIS — M549 Dorsalgia, unspecified: Secondary | ICD-10-CM | POA: Diagnosis not present

## 2018-07-05 DIAGNOSIS — Z89611 Acquired absence of right leg above knee: Secondary | ICD-10-CM | POA: Diagnosis not present

## 2018-07-05 DIAGNOSIS — D6862 Lupus anticoagulant syndrome: Secondary | ICD-10-CM | POA: Diagnosis not present

## 2018-07-05 DIAGNOSIS — G40219 Localization-related (focal) (partial) symptomatic epilepsy and epileptic syndromes with complex partial seizures, intractable, without status epilepticus: Secondary | ICD-10-CM | POA: Diagnosis not present

## 2018-07-05 DIAGNOSIS — F332 Major depressive disorder, recurrent severe without psychotic features: Secondary | ICD-10-CM | POA: Diagnosis not present

## 2018-07-07 DIAGNOSIS — G8929 Other chronic pain: Secondary | ICD-10-CM | POA: Diagnosis not present

## 2018-07-07 DIAGNOSIS — Z89611 Acquired absence of right leg above knee: Secondary | ICD-10-CM | POA: Diagnosis not present

## 2018-07-07 DIAGNOSIS — F332 Major depressive disorder, recurrent severe without psychotic features: Secondary | ICD-10-CM | POA: Diagnosis not present

## 2018-07-07 DIAGNOSIS — G40219 Localization-related (focal) (partial) symptomatic epilepsy and epileptic syndromes with complex partial seizures, intractable, without status epilepticus: Secondary | ICD-10-CM | POA: Diagnosis not present

## 2018-07-07 DIAGNOSIS — M549 Dorsalgia, unspecified: Secondary | ICD-10-CM | POA: Diagnosis not present

## 2018-07-07 DIAGNOSIS — D6862 Lupus anticoagulant syndrome: Secondary | ICD-10-CM | POA: Diagnosis not present

## 2018-07-10 DIAGNOSIS — Z89611 Acquired absence of right leg above knee: Secondary | ICD-10-CM | POA: Diagnosis not present

## 2018-07-10 DIAGNOSIS — M549 Dorsalgia, unspecified: Secondary | ICD-10-CM | POA: Diagnosis not present

## 2018-07-10 DIAGNOSIS — F332 Major depressive disorder, recurrent severe without psychotic features: Secondary | ICD-10-CM | POA: Diagnosis not present

## 2018-07-10 DIAGNOSIS — G40219 Localization-related (focal) (partial) symptomatic epilepsy and epileptic syndromes with complex partial seizures, intractable, without status epilepticus: Secondary | ICD-10-CM | POA: Diagnosis not present

## 2018-07-10 DIAGNOSIS — G8929 Other chronic pain: Secondary | ICD-10-CM | POA: Diagnosis not present

## 2018-07-10 DIAGNOSIS — D6862 Lupus anticoagulant syndrome: Secondary | ICD-10-CM | POA: Diagnosis not present

## 2018-07-12 ENCOUNTER — Other Ambulatory Visit: Payer: Self-pay

## 2018-07-12 ENCOUNTER — Encounter (HOSPITAL_COMMUNITY): Payer: Self-pay | Admitting: Licensed Clinical Social Worker

## 2018-07-12 ENCOUNTER — Ambulatory Visit (INDEPENDENT_AMBULATORY_CARE_PROVIDER_SITE_OTHER): Payer: Medicare Other | Admitting: Licensed Clinical Social Worker

## 2018-07-12 DIAGNOSIS — D6862 Lupus anticoagulant syndrome: Secondary | ICD-10-CM | POA: Diagnosis not present

## 2018-07-12 DIAGNOSIS — F330 Major depressive disorder, recurrent, mild: Secondary | ICD-10-CM

## 2018-07-12 DIAGNOSIS — Z86718 Personal history of other venous thrombosis and embolism: Secondary | ICD-10-CM | POA: Diagnosis not present

## 2018-07-12 DIAGNOSIS — M549 Dorsalgia, unspecified: Secondary | ICD-10-CM | POA: Diagnosis not present

## 2018-07-12 DIAGNOSIS — G8929 Other chronic pain: Secondary | ICD-10-CM | POA: Diagnosis not present

## 2018-07-12 DIAGNOSIS — Z89611 Acquired absence of right leg above knee: Secondary | ICD-10-CM | POA: Diagnosis not present

## 2018-07-12 DIAGNOSIS — G40219 Localization-related (focal) (partial) symptomatic epilepsy and epileptic syndromes with complex partial seizures, intractable, without status epilepticus: Secondary | ICD-10-CM | POA: Diagnosis not present

## 2018-07-12 DIAGNOSIS — F332 Major depressive disorder, recurrent severe without psychotic features: Secondary | ICD-10-CM | POA: Diagnosis not present

## 2018-07-12 NOTE — Progress Notes (Signed)
Virtual Visit via Telephone Note  I connected with Cassandra Andrade on 07/12/18 at  1:30 PM EDT by telephone and verified that I am speaking with the correct person using two identifiers.   I discussed the limitations, risks, security and privacy concerns of performing an evaluation and management service by telephone and the availability of in person appointments. I also discussed with the patient that there may be a patient responsible charge related to this service. The patient expressed understanding and agreed to proceed.  Type of Therapy: Individual Therapy   Treatment Goals addressed: improve psychiatric symptoms, elevate mood, Improve unhelpful thought patterns, interpersonal relationship skills, learn about diagnosis, healthy coping skills    Interventions: CBT and Motivational Interviewing, Grounding and Mindfulness Techniques, psychoeducation   Summary: Cassandra Andrade is a 53 y.o. female who presents with Major Depressive Disorder, recurrent, mild   Suicidal/Homicidal: No -without intent/plan   Therapist Response: Timberly met with clinician for an individual session. Cassandra Andrade discussed her psychiatric symptoms, her current life events and her homework. She shared that she has been doing pretty well, getting along with mom. She reports she has started up physical therapy and has been working on walking with a cane. Clinician explored the thoughts and feelings associated with walking with a cane and noted some hesitation and concern about practicing walking with a cane when the PT is not there, because she worries that mom cannot pick her up if she falls. Clinician offered support and encouraged positive visualization of herself walking with the cane. Clinician also urged Cassandra Andrade to take her time and to keep trying. Cassandra Andrade noted that she often says unkind things to herself regarding her walking and she can put her progress back because she gets discouraged. Clinician urged Cassandra Andrade to give herself a  pep talk and to be kind to herself about her progress.    Plan: Return again in 2-3 weeks.   Diagnosis: Axis I: Major Depressive Disorder, recurrent, mild   I discussed the assessment and treatment plan with the patient. The patient was provided an opportunity to ask questions and all were answered. The patient agreed with the plan and demonstrated an understanding of the instructions.   The patient was advised to call back or seek an in-person evaluation if the symptoms worsen or if the condition fails to improve as anticipated.  I provided 40 minutes of non-face-to-face time during this encounter.   [name], LCSW

## 2018-07-19 DIAGNOSIS — M549 Dorsalgia, unspecified: Secondary | ICD-10-CM | POA: Diagnosis not present

## 2018-07-19 DIAGNOSIS — G8929 Other chronic pain: Secondary | ICD-10-CM | POA: Diagnosis not present

## 2018-07-19 DIAGNOSIS — G40219 Localization-related (focal) (partial) symptomatic epilepsy and epileptic syndromes with complex partial seizures, intractable, without status epilepticus: Secondary | ICD-10-CM | POA: Diagnosis not present

## 2018-07-19 DIAGNOSIS — F332 Major depressive disorder, recurrent severe without psychotic features: Secondary | ICD-10-CM | POA: Diagnosis not present

## 2018-07-19 DIAGNOSIS — D6862 Lupus anticoagulant syndrome: Secondary | ICD-10-CM | POA: Diagnosis not present

## 2018-07-19 DIAGNOSIS — Z89611 Acquired absence of right leg above knee: Secondary | ICD-10-CM | POA: Diagnosis not present

## 2018-07-21 DIAGNOSIS — F332 Major depressive disorder, recurrent severe without psychotic features: Secondary | ICD-10-CM | POA: Diagnosis not present

## 2018-07-21 DIAGNOSIS — Z89611 Acquired absence of right leg above knee: Secondary | ICD-10-CM | POA: Diagnosis not present

## 2018-07-21 DIAGNOSIS — G8929 Other chronic pain: Secondary | ICD-10-CM | POA: Diagnosis not present

## 2018-07-21 DIAGNOSIS — G40219 Localization-related (focal) (partial) symptomatic epilepsy and epileptic syndromes with complex partial seizures, intractable, without status epilepticus: Secondary | ICD-10-CM | POA: Diagnosis not present

## 2018-07-21 DIAGNOSIS — M549 Dorsalgia, unspecified: Secondary | ICD-10-CM | POA: Diagnosis not present

## 2018-07-21 DIAGNOSIS — D6862 Lupus anticoagulant syndrome: Secondary | ICD-10-CM | POA: Diagnosis not present

## 2018-07-25 DIAGNOSIS — M549 Dorsalgia, unspecified: Secondary | ICD-10-CM | POA: Diagnosis not present

## 2018-07-25 DIAGNOSIS — D6862 Lupus anticoagulant syndrome: Secondary | ICD-10-CM | POA: Diagnosis not present

## 2018-07-25 DIAGNOSIS — F332 Major depressive disorder, recurrent severe without psychotic features: Secondary | ICD-10-CM | POA: Diagnosis not present

## 2018-07-25 DIAGNOSIS — Z89611 Acquired absence of right leg above knee: Secondary | ICD-10-CM | POA: Diagnosis not present

## 2018-07-25 DIAGNOSIS — G40219 Localization-related (focal) (partial) symptomatic epilepsy and epileptic syndromes with complex partial seizures, intractable, without status epilepticus: Secondary | ICD-10-CM | POA: Diagnosis not present

## 2018-07-25 DIAGNOSIS — G8929 Other chronic pain: Secondary | ICD-10-CM | POA: Diagnosis not present

## 2018-07-28 DIAGNOSIS — D6862 Lupus anticoagulant syndrome: Secondary | ICD-10-CM | POA: Diagnosis not present

## 2018-07-28 DIAGNOSIS — M549 Dorsalgia, unspecified: Secondary | ICD-10-CM | POA: Diagnosis not present

## 2018-07-28 DIAGNOSIS — G40219 Localization-related (focal) (partial) symptomatic epilepsy and epileptic syndromes with complex partial seizures, intractable, without status epilepticus: Secondary | ICD-10-CM | POA: Diagnosis not present

## 2018-07-28 DIAGNOSIS — Z89611 Acquired absence of right leg above knee: Secondary | ICD-10-CM | POA: Diagnosis not present

## 2018-07-28 DIAGNOSIS — G8929 Other chronic pain: Secondary | ICD-10-CM | POA: Diagnosis not present

## 2018-07-28 DIAGNOSIS — F332 Major depressive disorder, recurrent severe without psychotic features: Secondary | ICD-10-CM | POA: Diagnosis not present

## 2018-07-31 DIAGNOSIS — Z9689 Presence of other specified functional implants: Secondary | ICD-10-CM | POA: Diagnosis not present

## 2018-07-31 DIAGNOSIS — Z79899 Other long term (current) drug therapy: Secondary | ICD-10-CM | POA: Diagnosis not present

## 2018-07-31 DIAGNOSIS — Z8673 Personal history of transient ischemic attack (TIA), and cerebral infarction without residual deficits: Secondary | ICD-10-CM | POA: Diagnosis not present

## 2018-07-31 DIAGNOSIS — Z7901 Long term (current) use of anticoagulants: Secondary | ICD-10-CM | POA: Diagnosis not present

## 2018-07-31 DIAGNOSIS — M549 Dorsalgia, unspecified: Secondary | ICD-10-CM | POA: Diagnosis not present

## 2018-07-31 DIAGNOSIS — Z89611 Acquired absence of right leg above knee: Secondary | ICD-10-CM | POA: Diagnosis not present

## 2018-07-31 DIAGNOSIS — D6862 Lupus anticoagulant syndrome: Secondary | ICD-10-CM | POA: Diagnosis not present

## 2018-07-31 DIAGNOSIS — G40219 Localization-related (focal) (partial) symptomatic epilepsy and epileptic syndromes with complex partial seizures, intractable, without status epilepticus: Secondary | ICD-10-CM | POA: Diagnosis not present

## 2018-07-31 DIAGNOSIS — Z9181 History of falling: Secondary | ICD-10-CM | POA: Diagnosis not present

## 2018-07-31 DIAGNOSIS — Z86718 Personal history of other venous thrombosis and embolism: Secondary | ICD-10-CM | POA: Diagnosis not present

## 2018-07-31 DIAGNOSIS — F332 Major depressive disorder, recurrent severe without psychotic features: Secondary | ICD-10-CM | POA: Diagnosis not present

## 2018-07-31 DIAGNOSIS — Z85830 Personal history of malignant neoplasm of bone: Secondary | ICD-10-CM | POA: Diagnosis not present

## 2018-07-31 DIAGNOSIS — G8929 Other chronic pain: Secondary | ICD-10-CM | POA: Diagnosis not present

## 2018-08-03 DIAGNOSIS — G40219 Localization-related (focal) (partial) symptomatic epilepsy and epileptic syndromes with complex partial seizures, intractable, without status epilepticus: Secondary | ICD-10-CM | POA: Diagnosis not present

## 2018-08-03 DIAGNOSIS — M549 Dorsalgia, unspecified: Secondary | ICD-10-CM | POA: Diagnosis not present

## 2018-08-03 DIAGNOSIS — F332 Major depressive disorder, recurrent severe without psychotic features: Secondary | ICD-10-CM | POA: Diagnosis not present

## 2018-08-03 DIAGNOSIS — D6862 Lupus anticoagulant syndrome: Secondary | ICD-10-CM | POA: Diagnosis not present

## 2018-08-03 DIAGNOSIS — G8929 Other chronic pain: Secondary | ICD-10-CM | POA: Diagnosis not present

## 2018-08-03 DIAGNOSIS — Z89611 Acquired absence of right leg above knee: Secondary | ICD-10-CM | POA: Diagnosis not present

## 2018-08-09 DIAGNOSIS — Z86718 Personal history of other venous thrombosis and embolism: Secondary | ICD-10-CM | POA: Diagnosis not present

## 2018-08-10 ENCOUNTER — Ambulatory Visit (INDEPENDENT_AMBULATORY_CARE_PROVIDER_SITE_OTHER): Payer: Medicare Other | Admitting: Licensed Clinical Social Worker

## 2018-08-10 ENCOUNTER — Encounter (HOSPITAL_COMMUNITY): Payer: Self-pay | Admitting: Licensed Clinical Social Worker

## 2018-08-10 ENCOUNTER — Other Ambulatory Visit: Payer: Self-pay

## 2018-08-10 DIAGNOSIS — F330 Major depressive disorder, recurrent, mild: Secondary | ICD-10-CM | POA: Diagnosis not present

## 2018-08-10 NOTE — Progress Notes (Signed)
Virtual Visit via Telephone Note  I connected with Cassandra Andrade on 08/10/18 at  1:30 PM EDT by telephone and verified that I am speaking with the correct person using two identifiers.    I discussed the limitations, risks, security and privacy concerns of performing an evaluation and management service by telephone and the availability of in person appointments. I also discussed with the patient that there may be a patient responsible charge related to this service. The patient expressed understanding and agreed to proceed.    Type of Therapy: Individual Therapy   Treatment Goals addressed: improve psychiatric symptoms, elevate mood, Improve unhelpful thought patterns, interpersonal relationship skills, learn about diagnosis, healthy coping skills    Interventions: CBT and Motivational Interviewing, Grounding and Mindfulness Techniques, psychoeducation   Summary: Cassandra Andrade is a 53 y.o. female who presents with Major Depressive Disorder, recurrent, mild  Suicidal/Homicidal: No -without intent/plan   Therapist Response: Asami met with clinician for an individual session. Monty discussed her psychiatric symptoms, her current life events and her homework. She shared that she and her mom are both doing great. She reports increased motivation to exercise and has been doing about 30 minutes of walking a day with her walker. She reports she has been enjoying more inspirational talks and motivational speakers, such as Fransico Him. She also reports that she has been talking to her sons on the phone a lot. She is much happier with mom as her full time caregiver. Clinician processed changes in attitude and improved motivation. Clinician discussed the value of being present in every day.    Plan: Return again in 4 weeks.   Diagnosis: Axis I: Major Depressive Disorder, recurrent, mild I discussed the assessment and treatment plan with the patient. The patient was provided an opportunity to ask  questions and all were answered. The patient agreed with the plan and demonstrated an understanding of the instructions.   The patient was advised to call back or seek an in-person evaluation if the symptoms worsen or if the condition fails to improve as anticipated.  I provided 25 minutes of non-face-to-face time during this encounter.   [name], LCSW

## 2018-08-11 DIAGNOSIS — G8929 Other chronic pain: Secondary | ICD-10-CM | POA: Diagnosis not present

## 2018-08-11 DIAGNOSIS — M549 Dorsalgia, unspecified: Secondary | ICD-10-CM | POA: Diagnosis not present

## 2018-08-11 DIAGNOSIS — G40219 Localization-related (focal) (partial) symptomatic epilepsy and epileptic syndromes with complex partial seizures, intractable, without status epilepticus: Secondary | ICD-10-CM | POA: Diagnosis not present

## 2018-08-11 DIAGNOSIS — F332 Major depressive disorder, recurrent severe without psychotic features: Secondary | ICD-10-CM | POA: Diagnosis not present

## 2018-08-11 DIAGNOSIS — D6862 Lupus anticoagulant syndrome: Secondary | ICD-10-CM | POA: Diagnosis not present

## 2018-08-11 DIAGNOSIS — Z89611 Acquired absence of right leg above knee: Secondary | ICD-10-CM | POA: Diagnosis not present

## 2018-08-15 DIAGNOSIS — Z89611 Acquired absence of right leg above knee: Secondary | ICD-10-CM | POA: Diagnosis not present

## 2018-08-15 DIAGNOSIS — M549 Dorsalgia, unspecified: Secondary | ICD-10-CM | POA: Diagnosis not present

## 2018-08-15 DIAGNOSIS — G40219 Localization-related (focal) (partial) symptomatic epilepsy and epileptic syndromes with complex partial seizures, intractable, without status epilepticus: Secondary | ICD-10-CM | POA: Diagnosis not present

## 2018-08-15 DIAGNOSIS — D6862 Lupus anticoagulant syndrome: Secondary | ICD-10-CM | POA: Diagnosis not present

## 2018-08-15 DIAGNOSIS — G8929 Other chronic pain: Secondary | ICD-10-CM | POA: Diagnosis not present

## 2018-08-15 DIAGNOSIS — F332 Major depressive disorder, recurrent severe without psychotic features: Secondary | ICD-10-CM | POA: Diagnosis not present

## 2018-08-17 DIAGNOSIS — D6862 Lupus anticoagulant syndrome: Secondary | ICD-10-CM | POA: Diagnosis not present

## 2018-08-17 DIAGNOSIS — G40219 Localization-related (focal) (partial) symptomatic epilepsy and epileptic syndromes with complex partial seizures, intractable, without status epilepticus: Secondary | ICD-10-CM | POA: Diagnosis not present

## 2018-08-17 DIAGNOSIS — M549 Dorsalgia, unspecified: Secondary | ICD-10-CM | POA: Diagnosis not present

## 2018-08-17 DIAGNOSIS — F332 Major depressive disorder, recurrent severe without psychotic features: Secondary | ICD-10-CM | POA: Diagnosis not present

## 2018-08-17 DIAGNOSIS — Z89611 Acquired absence of right leg above knee: Secondary | ICD-10-CM | POA: Diagnosis not present

## 2018-08-17 DIAGNOSIS — G8929 Other chronic pain: Secondary | ICD-10-CM | POA: Diagnosis not present

## 2018-08-21 ENCOUNTER — Ambulatory Visit (INDEPENDENT_AMBULATORY_CARE_PROVIDER_SITE_OTHER): Payer: Medicare Other | Admitting: Psychiatry

## 2018-08-21 ENCOUNTER — Encounter (HOSPITAL_COMMUNITY): Payer: Self-pay | Admitting: Psychiatry

## 2018-08-21 DIAGNOSIS — F321 Major depressive disorder, single episode, moderate: Secondary | ICD-10-CM | POA: Diagnosis not present

## 2018-08-21 DIAGNOSIS — F339 Major depressive disorder, recurrent, unspecified: Secondary | ICD-10-CM | POA: Diagnosis not present

## 2018-08-21 DIAGNOSIS — F419 Anxiety disorder, unspecified: Secondary | ICD-10-CM

## 2018-08-21 MED ORDER — VENLAFAXINE HCL ER 150 MG PO CP24: 150.0000 mg | ORAL_CAPSULE | Freq: Every day | ORAL | 0 refills | Status: DC

## 2018-08-21 NOTE — Progress Notes (Signed)
Virtual Visit via Telephone Note  I connected with Cassandra Andrade on 08/21/18 at  1:00 PM EDT by telephone and verified that I am speaking with the correct person using two identifiers.   I discussed the limitations, risks, security and privacy concerns of performing an evaluation and management service by telephone and the availability of in person appointments. I also discussed with the patient that there may be a patient responsible charge related to this service. The patient expressed understanding and agreed to proceed.   History of Present Illness: Patient was evaluated by phone session.  She admitted lately getting more frustrated and anxious because she is not making enough progress with her prosthesis.  She is doing physical therapy.  She started walking and try to use prosthesis every day but she was hoping to make a lot of progress which she has not done yet.  Overall she describes her mood is stable.  She denies any crying spells or any feeling of hopelessness or worthlessness.  She is in touch with her older son who is now 57 year old.  She usually do texting with him.  She denies any panic attack or any agitation.  Her appetite is okay.  Her energy level is stable.  She had a good support from her mother.  She is getting therapy from [facility] on a regular basis.  She denies drinking or using any illegal substances.  She wants to continue her Effexor.  She is also getting Lamictal for her seizure prescribed by neurologist.  Past Psychiatric History: Reviewed. History of psychiatric admission in 2011 due to overdose on her pain medication.  No history of mania or psychosis.  Tried Zoloft and Remeron but do not remember.  Took Lexapro and Abilify but stopped due to lack of response.  Prozac worked for a long time until it stopped working.   Psychiatric Specialty Exam: Physical Exam  ROS  There were no vitals taken for this visit.There is no height or weight on file to calculate BMI.  General  Appearance: NA  Eye Contact:  NA  Speech:  Clear and Coherent and Slow  Volume:  Normal  Mood:  Anxious  Affect:  NA  Thought Process:  Goal Directed  Orientation:  Full (Time, Place, and Person)  Thought Content:  Logical  Suicidal Thoughts:  No  Homicidal Thoughts:  No  Memory:  Immediate;   Fair Recent;   Fair Remote;   Fair  Judgement:  Good  Insight:  Good  Psychomotor Activity:  Normal  Concentration:  Concentration: Fair and Attention Span: Fair  Recall:  [employer] of Knowledge:  Good  Language:  Good  Akathisia:  No  Handed:  Right  AIMS (if indicated):     Assets:  Communication Skills Desire for Improvement Housing Resilience Social Support  ADL's:  Intact  Cognition:  WNL  Sleep:         Assessment and Plan: Major depressive disorder, recurrent.  Anxiety.  Discuss her lack of progress with the prosthesis.  I encouraged she should discuss with the physical therapist if something she is not doing right.  She like to continue Effexor which is helping her depression anxiety.  Encouraged to continue therapy with the therapist.  Recommended to call us back if she is any question or any concern.  She has no side effects from the Effexor.  Follow-up in 3 months.  Continue Effexor XR 150 mg daily.  Follow Up Instructions:    I discussed the assessment  and treatment plan with the patient. The patient was provided an opportunity to ask questions and all were answered. The patient agreed with the plan and demonstrated an understanding of the instructions.   The patient was advised to call back or seek an in-person evaluation if the symptoms worsen or if the condition fails to improve as anticipated.  I provided 15 minutes of non-face-to-face time during this encounter.   Kathlee Nations, MD

## 2018-08-22 DIAGNOSIS — Z89611 Acquired absence of right leg above knee: Secondary | ICD-10-CM | POA: Diagnosis not present

## 2018-08-22 DIAGNOSIS — D6862 Lupus anticoagulant syndrome: Secondary | ICD-10-CM | POA: Diagnosis not present

## 2018-08-22 DIAGNOSIS — G8929 Other chronic pain: Secondary | ICD-10-CM | POA: Diagnosis not present

## 2018-08-22 DIAGNOSIS — F332 Major depressive disorder, recurrent severe without psychotic features: Secondary | ICD-10-CM | POA: Diagnosis not present

## 2018-08-22 DIAGNOSIS — G40219 Localization-related (focal) (partial) symptomatic epilepsy and epileptic syndromes with complex partial seizures, intractable, without status epilepticus: Secondary | ICD-10-CM | POA: Diagnosis not present

## 2018-08-22 DIAGNOSIS — M549 Dorsalgia, unspecified: Secondary | ICD-10-CM | POA: Diagnosis not present

## 2018-08-23 ENCOUNTER — Telehealth: Payer: Self-pay | Admitting: Neurology

## 2018-08-23 NOTE — Progress Notes (Signed)
PATIENT: Cassandra Andrade DOB: February 09, 1966  REASON FOR VISIT: follow up HISTORY FROM: patient  HISTORY OF PRESENT ILLNESS: Today 08/24/18  [name] is a 53 year old female with history of intractable seizures.  She currently has a vagal nerve stimulator.  The reason for this visit is the patient's mother called the office, reporting she has been dizzy and recently had 2 separate seizure events.  She indicates she has been compliant with her medications.  Her mother worries that the vagal nerve stimulator felt different.  She is on Lamictal, Keppra, and Vimpat. Laboratory evaluation in January 2020 showed seizure levels were in therapeutic range or above therapeutic range.  She is here today with her mother.  Her mother reports for the last 2 weeks, she feels that the VNS feels differently.  Normally it feels smoother, she feels there is a "bump" to it at her neck.  They indicate it is functioning properly.  She says 2 days ago, she had 2 seizures back-to-back.  She was able to stop the seizure by activating the VNS.  It has been a long time since she had seizures back to back.  During the time of the back to back seizures, she reports increase in her depression.  Apparently the depression has worsened, due to her feeling that she is not making as much progress in physical therapy with her right prosthesis.  Otherwise, they feel the VNS is functioning correctly and she can feel it cycling.  She did have 2 mornings where she felt dizzy while crawling to the bathroom (she had not put on her prosthesis yet). These events were not correlated with a seizure. She presents today for follow-up, accompanied by her mother.  HISTORY 06/01/2018 Dr. [name]: Cassandra Andrade is a 53 year old right-handed black female with a history of intractable seizures.  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  The patient currently is on Vimpat, Lamictal, and Keppra, she still  has ongoing seizures, she has had 4 seizures since seen in the end of January 2020.  Last seizure was this morning.  The patient will have mild tremors, she is able to activate her vagal nerve stimulator.  She reports no significant side effects on the antiepileptic medications, she is not drowsy or having significant cognitive slowing.  The patient reports that she has some problems with depression, she just recently had an adjustment to her Effexor dosing.  The patient is eating and drinking well, she states that she sleeps well at night.  She has not had any falls, she does have a prosthetic limb and walks with a walker.  Overall, she feels that her seizures have been under relatively good control.  REVIEW OF SYSTEMS: Out of a complete 14 system review of symptoms, the patient complains only of the following symptoms, and all other reviewed systems are negative.  Seizure  ALLERGIES: Allergies  Allergen Reactions  . Vicodin [Hydrocodone-Acetaminophen] Nausea And Vomiting  . Morphine And Related Rash    Allergic reaction only in iv form    HOME MEDICATIONS: Outpatient Medications Prior to Visit  Medication Sig Dispense Refill  . acetaminophen (TYLENOL) 500 MG tablet Take 1,000 mg by mouth every 8 (eight) hours as needed for mild pain or headache.     . Ascorbic Acid (VITAMIN C) 1000 MG tablet Take 1,000 mg by mouth daily.     . Cholecalciferol (VITAMIN D) 50 MCG (2000 UT) CAPS Take 1 capsule by mouth daily.    [name]  lamoTRIgine (LAMICTAL) 200 MG tablet TAKE 1 TABLET BY MOUTH TWICE A DAY 180 tablet 3  . lamoTRIgine (LAMICTAL) 25 MG tablet 2 TABLETS TWICE A DAY 360 tablet 3  . levETIRAcetam (KEPPRA) 1000 MG tablet TAKE 2 TABLETS (2,000 MG TOTAL) BY MOUTH 2 (TWO) TIMES DAILY. (Patient taking differently: Take 2,000 mg by mouth 2 (two) times daily. ) 360 tablet 3  . levothyroxine (SYNTHROID, LEVOTHROID) 50 MCG tablet Take 50 mcg by mouth daily before breakfast.    . Midazolam 5 MG/0.1ML SOLN Place  0.1 mLs into the nose once as needed for up to 1 dose (prolonged seizure). 1 each 3  . Prenatal Vit-Fe Fumarate-FA (PRENATAL MULTIVITAMIN) TABS tablet Take 1 tablet by mouth daily at 12 noon.    . promethazine (PHENERGAN) 25 MG tablet Take 25 mg by mouth every 6 (six) hours as needed for nausea.     [name] venlafaxine XR (EFFEXOR-XR) 150 MG 24 hr capsule Take 1 capsule (150 mg total) by mouth daily with breakfast. 90 capsule 0  . VIMPAT 200 MG TABS tablet TAKE 1 TABLET BY MOUTH TWICE A DAY 180 tablet 1  . warfarin (COUMADIN) 5 MG tablet Take 1.5-2 tablets (7.5-10 mg total) by mouth See admin instructions. Pt takes one and a half tablets (7.5mg  total) on Sunday and Wednesday and 2 tablets (10mg  total) all other days. (Patient taking differently: Take 7.5-10 mg by mouth See admin instructions. Pt takes 7.5mg  on Sunday and Wednesday and 10mg  all other days.)    . ranitidine (ZANTAC) 150 MG tablet Take 150 mg by mouth 2 (two) times daily.   2   No facility-administered medications prior to visit.     PAST MEDICAL HISTORY: Past Medical History:  Diagnosis Date  . Anxiety   . Arthritis   . Cancer (HCC)    Osteosarcoma, right leg  . CVA (cerebrovascular accident due to intracerebral hemorrhage) ([place]) 2004  . Depression   . DVT (deep venous thrombosis) (HCC)    Left leg  . MRSA (methicillin resistant Staphylococcus aureus)   . 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 Seizures ([place])   . Thyroid disease   . 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 ABOVE KNEE LEG AMPUTATION Right    Osteosarcoma  . BRAIN SURGERY  2004   cerebral aneurysm  . FRACTURE SURGERY     Rt. femur, folllowed by MRSA  . TOTAL KNEE ARTHROPLASTY Right     FAMILY HISTORY: Family History  Problem Relation Age of Onset  . Anxiety disorder Mother   . Diabetes Father     SOCIAL HISTORY: Social History    Socioeconomic History  . Marital status: Legally Separated    Spouse name: Not on file  . Number of children: 3  . Years of education: GED  . Highest education level: Not on file  Occupational History  . Not on file  Social Needs  . Financial resource strain: Not on file  . Food insecurity    Worry: Not on file    Inability: Not on file  . Transportation needs    Medical: Not on file    Non-medical: Not on file  Tobacco Use  . Smoking status: Never Smoker  . Smokeless tobacco: Never Used  . Tobacco comment: never used tobacco  Substance and Sexual Activity  . Alcohol use: No    Alcohol/week: 0.0  standard drinks  . Drug use: No  . Sexual activity: Not Currently  Lifestyle  . Physical activity    Days per week: Not on file    Minutes per session: Not on file  . Stress: Not on file  Relationships  . Social Herbalist on phone: Not on file    Gets together: Not on file    Attends religious service: Not on file    Active member of club or organization: Not on file    Attends meetings of clubs or organizations: Not on file    Relationship status: Not on file  . Intimate partner violence    Fear of current or ex partner: Not on file    Emotionally abused: Not on file    Physically abused: Not on file    Forced sexual activity: Not on file  Other Topics Concern  . Not on file  Social History Narrative   Patient lives at home with mom.    Patient has GED.    Patient has 3 children.    Patient is married but in the process of divorced.          PHYSICAL EXAM  Vitals:   08/24/18 1110  BP: 111/69  Pulse: 87  Temp: (!) 96.9 F (36.1 C)  Weight: 179 lb 6.4 oz (81.4 kg)  Height: 5' 7.5" (1.715 m)   Body mass index is 27.68 kg/m.  Generalized: Well developed, in no acute distress, using a walker   Neurological examination  Mentation: Alert oriented to time, place, history taking. Follows all commands speech and language fluent Cranial nerve II-XII:  Pupils were equal round reactive to light. Extraocular movements were full, visual field were full on confrontational test. Facial sensation and strength were normal. Uvula tongue midline. Head turning and shoulder shrug  were normal and symmetric. Motor: The motor testing reveals 5 over 5 strength of all 4 extremities, mild weakness left upper extremity good symmetric motor tone is noted throughout.  Sensory: Sensory testing is intact to soft touch on all extremities, with the exception of a right leg prosthesis Coordination: Cerebellar testing reveals good finger-nose-finger, unable to perform heel-to-shin due to  Gait and station: Gait is unsteady, using a walker, right prosthesis Reflexes: Deep tendon reflexes are symmetric and normal bilaterally, with the exception of RLE  DIAGNOSTIC DATA (LABS, IMAGING, TESTING) - I reviewed patient records, labs, notes, testing and imaging myself where available.  Lab Results  Component Value Date   WBC 3.7 (L) 03/03/2018   HGB 11.5 (L) 03/03/2018   HCT 37.1 03/03/2018   MCV 93.5 03/03/2018   PLT 180 03/03/2018      Component Value Date/Time   NA 141 03/03/2018 1526   NA 142 06/22/2017 1622   NA 141 01/12/2017 1125   K 3.4 (L) 03/03/2018 1526   K 4.5 01/12/2017 1125   CL 102 03/03/2018 1526   CL 97 (L) 07/11/2013 1346   CO2 29 03/03/2018 1526   CO2 30 (H) 01/12/2017 1125   GLUCOSE 108 (H) 03/03/2018 1526   GLUCOSE 79 01/12/2017 1125   GLUCOSE 126 (H) 07/11/2013 1346   BUN 8 03/03/2018 1526   BUN 18 06/22/2017 1622   BUN 11.2 01/12/2017 1125   CREATININE 1.04 (H) 03/03/2018 1526   CREATININE 0.98 01/11/2018 1116   CREATININE 0.9 01/12/2017 1125   CALCIUM 9.2 03/03/2018 1526   CALCIUM 10.1 01/12/2017 1125   PROT 6.7 03/03/2018 1526   PROT 7.2 06/22/2017  1622   PROT 7.8 01/12/2017 1125   ALBUMIN 3.8 03/03/2018 1526   ALBUMIN 4.4 06/22/2017 1622   ALBUMIN 4.0 01/12/2017 1125   AST 26 03/03/2018 1526   AST 23 01/11/2018 1116   AST 34  01/12/2017 1125   ALT 18 03/03/2018 1526   ALT 22 01/11/2018 1116   ALT 65 (H) 01/12/2017 1125   ALKPHOS 76 03/03/2018 1526   ALKPHOS 105 01/12/2017 1125   BILITOT 0.5 03/03/2018 1526   BILITOT 0.2 (L) 01/11/2018 1116   BILITOT 0.23 01/12/2017 1125   GFRNONAA >60 03/03/2018 1526   GFRNONAA >60 01/11/2018 1116   GFRAA >60 03/03/2018 1526   GFRAA >60 01/11/2018 1116   No results found for: CHOL, HDL, LDLCALC, LDLDIRECT, TRIG, CHOLHDL Lab Results  Component Value Date   HGBA1C (L) 12/26/2009    4.3 (NOTE)                                                                       According to the ADA Clinical Practice Recommendations for 2011, when HbA1c is used as a screening test:   >=6.5%   Diagnostic of Diabetes Mellitus           (if abnormal result  is confirmed)  5.7-6.4%   Increased risk of developing Diabetes Mellitus  References:Diagnosis and Classification of Diabetes Mellitus,Diabetes OBSJ,6283,66(QHUTM 1):S62-S69 and Standards of Medical Care in         Diabetes - 2011,Diabetes LYYT,0354,65  (Suppl 1):S11-S61.   Lab Results  Component Value Date   VITAMINB12 602 02/11/2010   Lab Results  Component Value Date   TSH 2.417 02/17/2013      ASSESSMENT AND PLAN 53 y.o. year old female  has a past medical history of Anxiety, Arthritis, Cancer ([place]), CVA (cerebrovascular accident due to intracerebral hemorrhage) ([place]) (2004), Depression, DVT (deep venous thrombosis) (HCC), MRSA (methicillin resistant Staphylococcus aureus), S/P BKA (below knee amputation) unilateral ([place]), Seizures ([place]), Thyroid disease, and Unspecified epilepsy with intractable epilepsy (02/28/2013). here with:  1.  Seizures, with VNS 2. Depression  Dr. [name] was able to interrogate the VNS.  We did not change any settings, it appears to be functioning correctly.  She may be developing scar tissue to the VNS site at her neck.  When she turns her head, she does not feel pulling or discomfort.  In regards to her, 2  back to back seizures.  She was able to stop the seizures with her VNS.  Her depression was more significant at the time, due to discouragement about her progress in physical therapy with her prosthesis.  She will work with physical therapy to determine what she can do to improve her progress so that she does not feel so discouraged.  She had a recent visit with psychiatry to discuss this concern.  She will be continuing on daily Effexor.  She has a follow-up appointment scheduled in October, at that time we can check seizure medication levels. Her report of dizziness seemed to be an isolated event. They will continue to monitor. It did not occur in the setting of seizure.    I spent 15 minutes with the patient. 50% of this time was spent discussing her plan of care and having Dr. [name]  interrogate her VNS.    Butler Denmark, AGNP-C, DNP 08/24/2018, 12:02 PM [facility] [address] [place], [place] [address] [phone]

## 2018-08-23 NOTE — Telephone Encounter (Signed)
Pt mother is asking for a call to discuss concerns she has about dizzy spells pt has had and recent 2 seizures from yesterday.

## 2018-08-23 NOTE — Telephone Encounter (Addendum)
I reached out to the pt's mom. She states the pt has been having difficulty with dizziness and recently had two separate seizure events. Mom denied the pt missing/chaning any of her seizure medications.  Mom is requesting for an evaluation I advised Dr. [name] NP, Maryjean Ka had an opening tomorrow at 11:15 and she accepted.  Mom did mention the location of the pt's vagal never stimulator felt "different" and would like for NP to look at tomorrow.   Mom understands to check in at 10:30 am tomorrow and to bring a face mask.

## 2018-08-24 ENCOUNTER — Encounter: Payer: Self-pay | Admitting: Neurology

## 2018-08-24 ENCOUNTER — Ambulatory Visit (INDEPENDENT_AMBULATORY_CARE_PROVIDER_SITE_OTHER): Payer: Medicare Other | Admitting: Neurology

## 2018-08-24 ENCOUNTER — Other Ambulatory Visit: Payer: Self-pay

## 2018-08-24 VITALS — BP 111/69 | HR 87 | Temp 96.9°F | Ht 67.5 in | Wt 179.4 lb

## 2018-08-24 DIAGNOSIS — Z89611 Acquired absence of right leg above knee: Secondary | ICD-10-CM | POA: Diagnosis not present

## 2018-08-24 DIAGNOSIS — G8929 Other chronic pain: Secondary | ICD-10-CM | POA: Diagnosis not present

## 2018-08-24 DIAGNOSIS — G40909 Epilepsy, unspecified, not intractable, without status epilepticus: Secondary | ICD-10-CM | POA: Diagnosis not present

## 2018-08-24 DIAGNOSIS — D6862 Lupus anticoagulant syndrome: Secondary | ICD-10-CM | POA: Diagnosis not present

## 2018-08-24 DIAGNOSIS — G40219 Localization-related (focal) (partial) symptomatic epilepsy and epileptic syndromes with complex partial seizures, intractable, without status epilepticus: Secondary | ICD-10-CM | POA: Diagnosis not present

## 2018-08-24 DIAGNOSIS — F332 Major depressive disorder, recurrent severe without psychotic features: Secondary | ICD-10-CM | POA: Diagnosis not present

## 2018-08-24 DIAGNOSIS — M549 Dorsalgia, unspecified: Secondary | ICD-10-CM | POA: Diagnosis not present

## 2018-08-24 NOTE — Progress Notes (Signed)
I have read the note, and I agree with the clinical assessment and plan.  Quantasia Stegner K Jennice Renegar   

## 2018-08-28 DIAGNOSIS — D6862 Lupus anticoagulant syndrome: Secondary | ICD-10-CM | POA: Diagnosis not present

## 2018-08-28 DIAGNOSIS — Z89611 Acquired absence of right leg above knee: Secondary | ICD-10-CM | POA: Diagnosis not present

## 2018-08-28 DIAGNOSIS — G40219 Localization-related (focal) (partial) symptomatic epilepsy and epileptic syndromes with complex partial seizures, intractable, without status epilepticus: Secondary | ICD-10-CM | POA: Diagnosis not present

## 2018-08-28 DIAGNOSIS — G8929 Other chronic pain: Secondary | ICD-10-CM | POA: Diagnosis not present

## 2018-08-28 DIAGNOSIS — F332 Major depressive disorder, recurrent severe without psychotic features: Secondary | ICD-10-CM | POA: Diagnosis not present

## 2018-08-28 DIAGNOSIS — M549 Dorsalgia, unspecified: Secondary | ICD-10-CM | POA: Diagnosis not present

## 2018-09-06 DIAGNOSIS — Z86718 Personal history of other venous thrombosis and embolism: Secondary | ICD-10-CM | POA: Diagnosis not present

## 2018-09-07 DIAGNOSIS — Z1231 Encounter for screening mammogram for malignant neoplasm of breast: Secondary | ICD-10-CM | POA: Diagnosis not present

## 2018-09-12 ENCOUNTER — Encounter (HOSPITAL_COMMUNITY): Payer: Self-pay | Admitting: Licensed Clinical Social Worker

## 2018-09-12 ENCOUNTER — Ambulatory Visit (INDEPENDENT_AMBULATORY_CARE_PROVIDER_SITE_OTHER): Payer: Medicare Other | Admitting: Licensed Clinical Social Worker

## 2018-09-12 ENCOUNTER — Other Ambulatory Visit: Payer: Self-pay

## 2018-09-12 DIAGNOSIS — F330 Major depressive disorder, recurrent, mild: Secondary | ICD-10-CM

## 2018-09-12 NOTE — Progress Notes (Signed)
Virtual Visit via Telephone Note  I connected with Cassandra Andrade on 09/12/18 at 11:00 AM EDT by telephone and verified that I am speaking with the correct person using two identifiers.     I discussed the limitations, risks, security and privacy concerns of performing an evaluation and management service by telephone and the availability of in person appointments. I also discussed with the patient that there may be a patient responsible charge related to this service. The patient expressed understanding and agreed to proceed.   Type of Therapy: Individual Therapy  Treatment Goals addressed: improve psychiatric symptoms, elevate mood, Improve unhelpful thought patterns, interpersonal relationship skills, learn about diagnosis, healthy coping skills  Interventions: CBT and Motivational Interviewing, Grounding and Mindfulness Techniques, psychoeducation  Summary: Cassandra Andrade is a 53 y.o. female who presents with Major Depressive Disorder, recurrent, mild  Suicidal/Homicidal: No -without intent/plan  Therapist Response: Jamelah met with clinician for an individual session. Zalaya discussed her psychiatric symptoms, her current life events and her homework. She shared that she got a little depressed and even cried once last week. Clinician explored trigger to depressed mood. Zerina reported feeling lonely was a big issue. Clinician processed experience and the coping skills put in place to improve her mood. Benicia identified that she got in touch with some family members who helped her feel better. Clinician processed thoughts and feelings about being at home and not being able to live a "normal life". Clinician validated thoughts and feelings, noting that this experience was universal. Clinician challenged [name] to take advantage of being home and to take up a new interest or hobby. Clinician discussed self esteem and identified the vast series of challenges Angelique has lived through and the miracle of  her being alive today. Clinician also identified the value of focusing on gratitude, as a way to manage stress and sadness.   Plan: Return again in 4 weeks.  Diagnosis: Axis I: Major Depressive Disorder, recurrent, mild   I discussed the assessment and treatment plan with the patient. The patient was provided an opportunity to ask questions and all were answered. The patient agreed with the plan and demonstrated an understanding of the instructions.   The patient was advised to call back or seek an in-person evaluation if the symptoms worsen or if the condition fails to improve as anticipated.  I provided 40 minutes of non-face-to-face time during this encounter.   [name], LCSW

## 2018-09-15 ENCOUNTER — Other Ambulatory Visit (HOSPITAL_COMMUNITY): Payer: Self-pay | Admitting: Psychiatry

## 2018-09-15 DIAGNOSIS — F321 Major depressive disorder, single episode, moderate: Secondary | ICD-10-CM

## 2018-09-15 DIAGNOSIS — F419 Anxiety disorder, unspecified: Secondary | ICD-10-CM

## 2018-09-30 ENCOUNTER — Other Ambulatory Visit: Payer: Self-pay | Admitting: Neurology

## 2018-10-04 DIAGNOSIS — Z86718 Personal history of other venous thrombosis and embolism: Secondary | ICD-10-CM | POA: Diagnosis not present

## 2018-10-10 ENCOUNTER — Ambulatory Visit (HOSPITAL_COMMUNITY): Payer: Medicare Other | Admitting: Licensed Clinical Social Worker

## 2018-10-10 ENCOUNTER — Other Ambulatory Visit: Payer: Self-pay

## 2018-10-16 ENCOUNTER — Ambulatory Visit (INDEPENDENT_AMBULATORY_CARE_PROVIDER_SITE_OTHER): Payer: Medicare Other | Admitting: Licensed Clinical Social Worker

## 2018-10-16 ENCOUNTER — Encounter (HOSPITAL_COMMUNITY): Payer: Self-pay | Admitting: Licensed Clinical Social Worker

## 2018-10-16 ENCOUNTER — Other Ambulatory Visit: Payer: Self-pay

## 2018-10-16 DIAGNOSIS — F330 Major depressive disorder, recurrent, mild: Secondary | ICD-10-CM

## 2018-10-16 NOTE — Progress Notes (Signed)
Virtual Visit via Telephone Note  I connected with Cassandra Andrade on 10/16/18 at  3:30 PM EDT by telephone and verified that I am speaking with the correct person using two identifiers.     I discussed the limitations, risks, security and privacy concerns of performing an evaluation and management service by telephone and the availability of in person appointments. I also discussed with the patient that there may be a patient responsible charge related to this service. The patient expressed understanding and agreed to proceed.  Type of Therapy: Individual Therapy  Treatment Goals addressed: improve psychiatric symptoms, elevate mood, Improve unhelpful thought patterns, interpersonal relationship skills, learn about diagnosis, healthy coping skills  Interventions: CBT and Motivational Interviewing, Grounding and Mindfulness Techniques, psychoeducation  Summary: Cassandra Andrade is a 53 y.o. female who presents with Major Depressive Disorder, recurrent, mild  Suicidal/Homicidal: No -without intent/plan  Therapist Response: Cassandra Andrade met with clinician for an individual session. Cassandra Andrade discussed her psychiatric symptoms, her current life events and her homework. She shared that she has been thinking about ways to get involved with children who have been abused in an outreach sense. She planned out an idea for an intervention and reported interest in sharing it through a church or community center. Clinician explored the ideas and noted ways for her to use this for herself and also to share in the community.  Clinician explored mood and overall health. Cassandra Andrade reports she is doing well overall. She identified interest in getting a support dog. However, her mother reports she does not want to take on the responsibility of a dog. Clinician agreed to provide information about service dogs and to give Cassandra Andrade and mom time to do research.  Plan: Return again in 4 weeks.  Diagnosis: Axis I: Major Depressive  Disorder, recurrent, mild   I discussed the assessment and treatment plan with the patient. The patient was provided an opportunity to ask questions and all were answered. The patient agreed with the plan and demonstrated an understanding of the instructions.   The patient was advised to call back or seek an in-person evaluation if the symptoms worsen or if the condition fails to improve as anticipated.  I provided 45 minutes of non-face-to-face time during this encounter.   [name], LCSW

## 2018-10-18 ENCOUNTER — Other Ambulatory Visit (HOSPITAL_COMMUNITY): Payer: Self-pay | Admitting: Psychiatry

## 2018-10-18 DIAGNOSIS — F419 Anxiety disorder, unspecified: Secondary | ICD-10-CM

## 2018-10-18 DIAGNOSIS — F321 Major depressive disorder, single episode, moderate: Secondary | ICD-10-CM

## 2018-11-01 DIAGNOSIS — Z86718 Personal history of other venous thrombosis and embolism: Secondary | ICD-10-CM | POA: Diagnosis not present

## 2018-11-07 DIAGNOSIS — Z23 Encounter for immunization: Secondary | ICD-10-CM | POA: Diagnosis not present

## 2018-11-16 ENCOUNTER — Encounter (HOSPITAL_COMMUNITY): Payer: Self-pay | Admitting: Licensed Clinical Social Worker

## 2018-11-16 ENCOUNTER — Ambulatory Visit (INDEPENDENT_AMBULATORY_CARE_PROVIDER_SITE_OTHER): Payer: Medicare Other | Admitting: Licensed Clinical Social Worker

## 2018-11-16 ENCOUNTER — Other Ambulatory Visit: Payer: Self-pay

## 2018-11-16 DIAGNOSIS — F330 Major depressive disorder, recurrent, mild: Secondary | ICD-10-CM | POA: Diagnosis not present

## 2018-11-16 NOTE — Progress Notes (Signed)
Virtual Visit via Telephone Note  I connected with Cassandra Andrade on 11/16/18 at 10:00 AM EDT by telephone and verified that I am speaking with the correct person using two identifiers.    I discussed the limitations, risks, security and privacy concerns of performing an evaluation and management service by telephone and the availability of in person appointments. I also discussed with the patient that there may be a patient responsible charge related to this service. The patient expressed understanding and agreed to proceed.  Type of Therapy: Individual Therapy  Treatment Goals addressed: improve psychiatric symptoms, elevate mood, Improve unhelpful thought patterns, interpersonal relationship skills, learn about diagnosis, healthy coping skills  Interventions: CBT and Motivational Interviewing, Grounding and Mindfulness Techniques, psychoeducation  Summary: Cassandra Andrade is a 53 y.o. female who presents with Major Depressive Disorder, recurrent, mild  Suicidal/Homicidal: No -without intent/plan  Therapist Response: Cassandra Andrade met with clinician for an individual session. Cassandra Andrade discussed her psychiatric symptoms, her current life events and her homework. She shared that things have been doing okay. She reports mainly positive mood and happy feelings. However, she reports lack of motivation to move forward with her walking. Clinician explored daily routine and encouraged her to put the leg on daily and do walking exercises in the house. Clinician also encouraged Cassandra Andrade to start daily journal to process thoughts and feelings.Clinician provided feedback about ways to start and encouraged her to choose a time, put in on her calendar, and to start writing for a few minutes every day.   Plan: Return again in 4 weeks.  Diagnosis: Axis I: Major Depressive Disorder, recurrent, mild    I discussed the assessment and treatment plan with the patient. The patient was provided an opportunity to ask questions  and all were answered. The patient agreed with the plan and demonstrated an understanding of the instructions.   The patient was advised to call back or seek an in-person evaluation if the symptoms worsen or if the condition fails to improve as anticipated.  I provided 40 minutes of non-face-to-face time during this encounter.   [name], LCSW

## 2018-11-22 ENCOUNTER — Other Ambulatory Visit: Payer: Self-pay

## 2018-11-22 ENCOUNTER — Encounter (HOSPITAL_COMMUNITY): Payer: Self-pay | Admitting: Psychiatry

## 2018-11-22 ENCOUNTER — Ambulatory Visit (INDEPENDENT_AMBULATORY_CARE_PROVIDER_SITE_OTHER): Payer: Medicare Other | Admitting: Psychiatry

## 2018-11-22 DIAGNOSIS — F419 Anxiety disorder, unspecified: Secondary | ICD-10-CM

## 2018-11-22 DIAGNOSIS — F321 Major depressive disorder, single episode, moderate: Secondary | ICD-10-CM | POA: Diagnosis not present

## 2018-11-22 MED ORDER — VENLAFAXINE HCL ER 150 MG PO CP24: 150.0000 mg | ORAL_CAPSULE | Freq: Every day | ORAL | 0 refills | Status: DC

## 2018-11-22 NOTE — Progress Notes (Signed)
Virtual Visit via Telephone Note  I connected with Cassandra Andrade on 11/22/18 at  1:00 PM EDT by telephone and verified that I am speaking with the correct person using two identifiers.   I discussed the limitations, risks, security and privacy concerns of performing an evaluation and management service by telephone and the availability of in person appointments. I also discussed with the patient that there may be a patient responsible charge related to this service. The patient expressed understanding and agreed to proceed.   History of Present Illness: Patient was evaluated by phone session.  She started using prosthesis and now sometimes she go outside but also uses walker.  She feels her depression is much stable.  She takes Effexor every day.  She denies any crying spells and feeling of hopelessness and worthlessness.  She is in touch with her 31 year old son through the text and sometimes she calls.  She denies any agitation, anger.  Her appetite is okay.  Her energy level is good.  She had a good support from her mother.  She is getting therapy from [facility] on a regular basis.  She denies drinking or using any illegal substances.  She saw neurology in July which she thought having dizzy spells.  Patient is on vagal nerve stimulator to prevent seizures.  She is also on multiple antiseizure medicines.  Patient reported no tremors shakes or any EPS.  She wants to continue current Effexor.  Her anxiety is a stable.   Past Psychiatric History:Reviewed. History of psychiatric admission in 2011 due to overdose on her pain medication. No history of mania or psychosis. Tried Zoloft and Remeron but do not remember. Took Lexapro and Abilify but stopped due to lack of response. Prozac worked for a long time until it stopped working.  Psychiatric Specialty Exam: Physical Exam  ROS  There were no vitals taken for this visit.There is no height or weight on file to calculate BMI.  General Appearance: NA   Eye Contact:  NA  Speech:  Clear and Coherent and fast  Volume:  Normal  Mood:  Anxious  Affect:  NA  Thought Process:  Descriptions of Associations: Intact  Orientation:  Full (Time, Place, and Person)  Thought Content:  WDL and Logical  Suicidal Thoughts:  No  Homicidal Thoughts:  No  Memory:  Immediate;   Fair Recent;   Fair Remote;   Fair  Judgement:  Good  Insight:  Good  Psychomotor Activity:  NA  Concentration:  Concentration: Fair and Attention Span: Fair  Recall:  [place] of Knowledge:  Good  Language:  Good  Akathisia:  No  Handed:  Right  AIMS (if indicated):     Assets:  Communication Skills Desire for Improvement Housing Resilience Social Support  ADL's:  Intact  Cognition:  WNL  Sleep:   fair      Assessment and Plan: Major depressive disorder, recurrent.  Anxiety.  Patient is doing well on Effexor.  She is more motivated and now started using prosthesis on a regular basis and even go outside with the prosthesis but also need a walker.  Her anxiety is much better.  Encouraged to continue therapy with Janett Billow for coping skills.  Continue Effexor XR 150 mg daily.  Encouraged to keep appointment with neurology for the management of seizures.  Recommended to call us back if she has any question or any concern.  Follow-up in 3 months.  Follow Up Instructions:    I discussed the assessment and  treatment plan with the patient. The patient was provided an opportunity to ask questions and all were answered. The patient agreed with the plan and demonstrated an understanding of the instructions.   The patient was advised to call back or seek an in-person evaluation if the symptoms worsen or if the condition fails to improve as anticipated.  I provided 20 minutes of non-face-to-face time during this encounter.   Kathlee Nations, MD

## 2018-11-29 DIAGNOSIS — Z86718 Personal history of other venous thrombosis and embolism: Secondary | ICD-10-CM | POA: Diagnosis not present

## 2018-11-30 ENCOUNTER — Other Ambulatory Visit (HOSPITAL_COMMUNITY): Payer: Self-pay | Admitting: Psychiatry

## 2018-11-30 DIAGNOSIS — F321 Major depressive disorder, single episode, moderate: Secondary | ICD-10-CM

## 2018-11-30 DIAGNOSIS — F419 Anxiety disorder, unspecified: Secondary | ICD-10-CM

## 2018-12-01 ENCOUNTER — Other Ambulatory Visit: Payer: Self-pay | Admitting: Neurology

## 2018-12-01 MED ORDER — LACOSAMIDE 200 MG PO TABS: 200.0000 mg | ORAL_TABLET | Freq: Two times a day (BID) | ORAL | 1 refills | Status: DC

## 2018-12-04 ENCOUNTER — Ambulatory Visit: Payer: Medicare Other | Admitting: Neurology

## 2018-12-13 ENCOUNTER — Other Ambulatory Visit: Payer: Self-pay

## 2018-12-13 ENCOUNTER — Encounter (HOSPITAL_COMMUNITY): Payer: Self-pay | Admitting: Licensed Clinical Social Worker

## 2018-12-13 ENCOUNTER — Ambulatory Visit (INDEPENDENT_AMBULATORY_CARE_PROVIDER_SITE_OTHER): Payer: Medicare Other | Admitting: Licensed Clinical Social Worker

## 2018-12-13 DIAGNOSIS — F330 Major depressive disorder, recurrent, mild: Secondary | ICD-10-CM

## 2018-12-13 NOTE — Progress Notes (Signed)
Virtual Visit via Telephone Note  I connected with Cassandra Andrade on 12/13/18 at 11:00 AM EDT by telephone and verified that I am speaking with the correct person using two identifiers.     I discussed the limitations, risks, security and privacy concerns of performing an evaluation and management service by telephone and the availability of in person appointments. I also discussed with the patient that there may be a patient responsible charge related to this service. The patient expressed understanding and agreed to proceed.   Type of Therapy: Individual Therapy  Treatment Goals addressed: improve psychiatric symptoms, elevate mood, Improve unhelpful thought patterns, interpersonal relationship skills, learn about diagnosis, healthy coping skills  Interventions: CBT and Motivational Interviewing, Grounding and Mindfulness Techniques, psychoeducation  Summary: Cassandra Andrade is a 53 y.o. female who presents with Major Depressive Disorder, recurrent, mild  Suicidal/Homicidal: No -without intent/plan  Therapist Response: Cassandra Andrade met with clinician for an individual session. Cassandra Andrade discussed her psychiatric symptoms, her current life events and her homework. She shared that she has been doing pretty well. She reports she has been thinking a lot about her friends and family. She went to [place] to visit her sister and brother in law who just moved there. She reports the trip was very rejuvenating for her and she really enjoyed her time. She also reports that she and mom have been talking about possibly moving to [place] to be closer with her sister. Clinician explored thoughts and feelings about relocating and discussed pros and cons. Cassandra Andrade reports she would like to be closer but had some reservations about changing providers. Clinician encouraged Cassandra Andrade to discuss this with mom and to not let doctors be the reason she does not take this opportunity if she really wants to go. Clinician discussed  relationships with other family members and noted that Cassandra Andrade is the "trusted adult" for a lot of her cousins, nieces, and nephews. Clinician noted the importance of being that person for the kids, as well as being able to talk to her mother about things that are bothering her of confuse her.   Plan: Return again in 4 weeks.  Diagnosis: Axis I: Major Depressive Disorder, recurrent, mild     I discussed the assessment and treatment plan with the patient. The patient was provided an opportunity to ask questions and all were answered. The patient agreed with the plan and demonstrated an understanding of the instructions.   The patient was advised to call back or seek an in-person evaluation if the symptoms worsen or if the condition fails to improve as anticipated.  I provided 45 minutes of non-face-to-face time during this encounter.   [name], LCSW

## 2018-12-24 NOTE — Progress Notes (Signed)
PATIENT: Cassandra Andrade DOB: 11/16/1965  REASON FOR VISIT: follow up HISTORY FROM: patient  HISTORY OF PRESENT ILLNESS: Today 12/25/18  Cassandra Andrade is a 53 year old female with history intractable seizures with vagal nerve stimulator.  She indicates she has been doing well.  On average, she may have 1 seizure a week, but she is able to stop by swiping her stimulator.  There may be some weeks, she doesn't have any seizures.  She has not had a prolonged seizure.  She remains on Vimpat, Keppra, and Lamictal.  She has been wearing her right prosthesis more often and trying to be more active.  She does use a walker.  She indicates that her depression has been under good control with Effexor.  She notices she will have more seizures if she is nervous.  She denies any further episodes of dizziness.  Since last seen, she had 1 fall where she lost her balance.  She says she is considering getting a therapy dog for her depression.  She presents today for follow-up accompanied by her mother.  Overall, she feels that her seizures have been under good control.  HISTORY 08/24/2018 SS: Cassandra Andrade is a 53 year old female with history of intractable seizures.  She currently has a vagal nerve stimulator.  The reason for this visit is the patient's mother called the office, reporting she has been dizzy and recently had 2 separate seizure events.  She indicates she has been compliant with her medications.  Her mother worries that the vagal nerve stimulator felt different.  She is on Lamictal, Keppra, and Vimpat. Laboratory evaluation in January 2020 showed seizure levels were in therapeutic range or above therapeutic range.  She is here today with her mother.  Her mother reports for the last 2 weeks, she feels that the VNS feels differently.  Normally it feels smoother, she feels there is a "bump" to it at her neck.  They indicate it is functioning properly.  She says 2 days ago, she had 2 seizures back-to-back.  She was  able to stop the seizure by activating the VNS.  It has been a long time since she had seizures back to back.  During the time of the back to back seizures, she reports increase in her depression.  Apparently the depression has worsened, due to her feeling that she is not making as much progress in physical therapy with her right prosthesis.  Otherwise, they feel the VNS is functioning correctly and she can feel it cycling.  She did have 2 mornings where she felt dizzy while crawling to the bathroom (she had not put on her prosthesis yet). These events were not correlated with a seizure. She presents today for follow-up, accompanied by her mother.  REVIEW OF SYSTEMS: Out of a complete 14 system review of symptoms, the patient complains only of the following symptoms, and all other reviewed systems are negative.  Frequency of urination, memory loss, headache, seizure, depression, nervous/anxious  ALLERGIES: Allergies  Allergen Reactions  . Vicodin [Hydrocodone-Acetaminophen] Nausea And Vomiting  . Morphine And Related Rash    Allergic reaction only in iv form    HOME MEDICATIONS: Outpatient Medications Prior to Visit  Medication Sig Dispense Refill  . acetaminophen (TYLENOL) 500 MG tablet Take 1,000 mg by mouth every 8 (eight) hours as needed for mild pain or headache.     . Ascorbic Acid (VITAMIN C) 1000 MG tablet Take 1,000 mg by mouth daily.     . Cholecalciferol (VITAMIN D)  50 MCG (2000 UT) CAPS Take 1 capsule by mouth daily.    [name] lacosamide (VIMPAT) 200 MG TABS tablet Take 1 tablet (200 mg total) by mouth 2 (two) times daily. 180 tablet 1  . lamoTRIgine (LAMICTAL) 200 MG tablet TAKE 1 TABLET BY MOUTH TWICE A DAY 180 tablet 3  . lamoTRIgine (LAMICTAL) 25 MG tablet 2 TABLETS TWICE A DAY 360 tablet 3  . levETIRAcetam (KEPPRA) 1000 MG tablet TAKE 2 TABLETS (2,000 MG TOTAL) BY MOUTH 2 (TWO) TIMES DAILY. 360 tablet 0  . levothyroxine (SYNTHROID, LEVOTHROID) 50 MCG tablet Take 50 mcg by mouth  daily before breakfast.    . Midazolam 5 MG/0.1ML SOLN Place 0.1 mLs into the nose once as needed for up to 1 dose (prolonged seizure). 1 each 3  . Prenatal Vit-Fe Fumarate-FA (PRENATAL MULTIVITAMIN) TABS tablet Take 1 tablet by mouth daily at 12 noon.    . promethazine (PHENERGAN) 25 MG tablet Take 25 mg by mouth every 6 (six) hours as needed for nausea.     [name] venlafaxine XR (EFFEXOR-XR) 150 MG 24 hr capsule Take 1 capsule (150 mg total) by mouth daily with breakfast. 90 capsule 0  . warfarin (COUMADIN) 5 MG tablet Take 1.5-2 tablets (7.5-10 mg total) by mouth See admin instructions. Pt takes one and a half tablets (7.5mg  total) on Sunday and Wednesday and 2 tablets (10mg  total) all other days. (Patient taking differently: Take 7.5-10 mg by mouth See admin instructions. Pt takes 7.5mg  on Sunday and Wednesday and 10mg  all other days.)     No facility-administered medications prior to visit.     PAST MEDICAL HISTORY: Past Medical History:  Diagnosis Date  . Anxiety   . Arthritis   . Cancer (HCC)    Osteosarcoma, right leg  . CVA (cerebrovascular accident due to intracerebral hemorrhage) ([place]) 2004  . Depression   . DVT (deep venous thrombosis) (HCC)    Left leg  . MRSA (methicillin resistant Staphylococcus aureus)   . 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 Seizures ([facility])   . Thyroid disease   . 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 ABOVE KNEE LEG AMPUTATION Right    Osteosarcoma  . BRAIN SURGERY  2004   cerebral aneurysm  . FRACTURE SURGERY     Rt. femur, folllowed by MRSA  . TOTAL KNEE ARTHROPLASTY Right     FAMILY HISTORY: Family History  Problem Relation Age of Onset  . Anxiety disorder Mother   . Diabetes Father     SOCIAL HISTORY: Social History   Socioeconomic History  . Marital status: Legally Separated    Spouse name: Not on file  .  Number of children: 3  . Years of education: GED  . Highest education level: Not on file  Occupational History  . Not on file  Social Needs  . Financial resource strain: Not on file  . Food insecurity    Worry: Not on file    Inability: Not on file  . Transportation needs    Medical: Not on file    Non-medical: Not on file  Tobacco Use  . Smoking status: Never Smoker  . Smokeless tobacco: Never Used  . Tobacco comment: never used tobacco  Substance and Sexual Activity  . Alcohol use: No    Alcohol/week: 0.0 standard drinks  . Drug use: No  . Sexual activity: Not  Currently  Lifestyle  . Physical activity    Days per week: Not on file    Minutes per session: Not on file  . Stress: Not on file  Relationships  . Social Herbalist on phone: Not on file    Gets together: Not on file    Attends religious service: Not on file    Active member of club or organization: Not on file    Attends meetings of clubs or organizations: Not on file    Relationship status: Not on file  . Intimate partner violence    Fear of current or ex partner: Not on file    Emotionally abused: Not on file    Physically abused: Not on file    Forced sexual activity: Not on file  Other Topics Concern  . Not on file  Social History Narrative   Patient lives at home with mom.    Patient has GED.    Patient has 3 children.    Patient is married but in the process of divorced.        PHYSICAL EXAM  Vitals:   12/25/18 1005  BP: 126/75  Pulse: 95  Temp: 97.9 F (36.6 C)  TempSrc: Oral  Weight: 179 lb 3.2 oz (81.3 kg)  Height: 5' 7.5" (1.715 m)   Body mass index is 27.65 kg/m.  Generalized: Well developed, in no acute distress  Neurological examination  Mentation: Alert oriented to time, place, history taking. Follows all commands speech and language fluent Cranial nerve II-XII: Pupils were equal round reactive to light. Extraocular movements were full, visual field were full on  confrontational test. Facial sensation and strength were normal.  Head turning and shoulder shrug  were normal and symmetric. Motor: The motor testing reveals 5 over 5 strength of extremities, right above-the-knee prosthesis. Good symmetric motor tone is noted throughout.  Sensory: Sensory testing is intact to soft touch on all 4 extremities. No evidence of extinction is noted.  Coordination: Cerebellar testing reveals good finger-nose-finger bilaterally Gait and station: Has to push off from seated position to stand with walker, gait is wide-based, unsteady, assisted with walker Reflexes: Deep tendon reflexes are symmetric, with exception of RLE  DIAGNOSTIC DATA (LABS, IMAGING, TESTING) - I reviewed patient records, labs, notes, testing and imaging myself where available.  Lab Results  Component Value Date   WBC 3.7 (L) 03/03/2018   HGB 11.5 (L) 03/03/2018   HCT 37.1 03/03/2018   MCV 93.5 03/03/2018   PLT 180 03/03/2018      Component Value Date/Time   NA 141 03/03/2018 1526   NA 142 06/22/2017 1622   NA 141 01/12/2017 1125   K 3.4 (L) 03/03/2018 1526   K 4.5 01/12/2017 1125   CL 102 03/03/2018 1526   CL 97 (L) 07/11/2013 1346   CO2 29 03/03/2018 1526   CO2 30 (H) 01/12/2017 1125   GLUCOSE 108 (H) 03/03/2018 1526   GLUCOSE 79 01/12/2017 1125   GLUCOSE 126 (H) 07/11/2013 1346   BUN 8 03/03/2018 1526   BUN 18 06/22/2017 1622   BUN 11.2 01/12/2017 1125   CREATININE 1.04 (H) 03/03/2018 1526   CREATININE 0.98 01/11/2018 1116   CREATININE 0.9 01/12/2017 1125   CALCIUM 9.2 03/03/2018 1526   CALCIUM 10.1 01/12/2017 1125   PROT 6.7 03/03/2018 1526   PROT 7.2 06/22/2017 1622   PROT 7.8 01/12/2017 1125   ALBUMIN 3.8 03/03/2018 1526   ALBUMIN 4.4 06/22/2017 1622   ALBUMIN  4.0 01/12/2017 1125   AST 26 03/03/2018 1526   AST 23 01/11/2018 1116   AST 34 01/12/2017 1125   ALT 18 03/03/2018 1526   ALT 22 01/11/2018 1116   ALT 65 (H) 01/12/2017 1125   ALKPHOS 76 03/03/2018 1526    ALKPHOS 105 01/12/2017 1125   BILITOT 0.5 03/03/2018 1526   BILITOT 0.2 (L) 01/11/2018 1116   BILITOT 0.23 01/12/2017 1125   GFRNONAA >60 03/03/2018 1526   GFRNONAA >60 01/11/2018 1116   GFRAA >60 03/03/2018 1526   GFRAA >60 01/11/2018 1116   No results found for: CHOL, HDL, LDLCALC, LDLDIRECT, TRIG, CHOLHDL Lab Results  Component Value Date   HGBA1C (L) 12/26/2009    4.3 (NOTE)                                                                       According to the ADA Clinical Practice Recommendations for 2011, when HbA1c is used as a screening test:   >=6.5%   Diagnostic of Diabetes Mellitus           (if abnormal result  is confirmed)  5.7-6.4%   Increased risk of developing Diabetes Mellitus  References:Diagnosis and Classification of Diabetes Mellitus,Diabetes [id number] 1):S62-S69 and Standards of Medical Care in         Diabetes - 2011,Diabetes [id number]  (Suppl 1):S11-S61.   Lab Results  Component Value Date   VITAMINB12 602 02/11/2010   Lab Results  Component Value Date   TSH 2.417 02/17/2013    ASSESSMENT AND PLAN 53 y.o. year old female  has a past medical history of Anxiety, Arthritis, Cancer ([facility]), CVA (cerebrovascular accident due to intracerebral hemorrhage) ([facility]) (2004), Depression, DVT (deep venous thrombosis) (HCC), MRSA (methicillin resistant Staphylococcus aureus), S/P BKA (below knee amputation) unilateral ([place]), Seizures ([facility] [place]), Thyroid disease, and Unspecified epilepsy with intractable epilepsy (02/28/2013). here with:  1.  Seizures, with vagal nerve stimulator 2.  Depression  Since last seen, she has continued to do well.  She indicates her VNS is functioning well and she is able to stop oncoming seizures.  She will remain on Vimpat, Lamictal, and Keppra.  Her mother has midazolam on hand, in case of a prolonged seizure.  Today, I will check routine lab work.  Her depression seems to be under good control with daily Effexor.  She will follow-up in 6  months or sooner if needed.  I did advise if her symptoms worsen or she develops any new symptoms she should let us know. Her VNS was checked in July 2020, no settings were changed.   I spent 15 minutes with the patient. 50% of this time was spent discussing her plan of care.  Butler Denmark, AGNP-C, DNP 12/25/2018, 10:14 AM [facility] [address] [place], [place] [address] [phone]

## 2018-12-25 ENCOUNTER — Ambulatory Visit (INDEPENDENT_AMBULATORY_CARE_PROVIDER_SITE_OTHER): Payer: Medicare Other | Admitting: Neurology

## 2018-12-25 ENCOUNTER — Encounter: Payer: Self-pay | Admitting: Neurology

## 2018-12-25 ENCOUNTER — Other Ambulatory Visit: Payer: Self-pay

## 2018-12-25 VITALS — BP 126/75 | HR 95 | Temp 97.9°F | Ht 67.5 in | Wt 179.2 lb

## 2018-12-25 DIAGNOSIS — G40909 Epilepsy, unspecified, not intractable, without status epilepticus: Secondary | ICD-10-CM

## 2018-12-25 MED ORDER — LEVETIRACETAM 1000 MG PO TABS: ORAL_TABLET | ORAL | 1 refills | Status: DC

## 2018-12-25 NOTE — Patient Instructions (Signed)
Continue current medications vimpat, lamictal, and keppra. I will check lab work today. Return in 6 months.

## 2018-12-26 NOTE — Progress Notes (Signed)
I have read the note, and I agree with the clinical assessment and plan.  Eman Rynders K Breon Diss   

## 2018-12-27 ENCOUNTER — Telehealth: Payer: Self-pay | Admitting: *Deleted

## 2018-12-27 DIAGNOSIS — Z86718 Personal history of other venous thrombosis and embolism: Secondary | ICD-10-CM | POA: Diagnosis not present

## 2018-12-27 LAB — CBC WITH DIFFERENTIAL/PLATELET
Basophils Absolute: 0 10*3/uL (ref 0.0–0.2)
Basos: 1 %
EOS (ABSOLUTE): 0 10*3/uL (ref 0.0–0.4)
Eos: 1 %
Hematocrit: 40.1 % (ref 34.0–46.6)
Hemoglobin: 12.7 g/dL (ref 11.1–15.9)
Immature Grans (Abs): 0 10*3/uL (ref 0.0–0.1)
Immature Granulocytes: 0 %
Lymphocytes Absolute: 1.7 10*3/uL (ref 0.7–3.1)
Lymphs: 48 %
MCH: 27.9 pg (ref 26.6–33.0)
MCHC: 31.7 g/dL (ref 31.5–35.7)
MCV: 88 fL (ref 79–97)
Monocytes Absolute: 0.3 10*3/uL (ref 0.1–0.9)
Monocytes: 8 %
Neutrophils Absolute: 1.5 10*3/uL (ref 1.4–7.0)
Neutrophils: 42 %
Platelets: 242 10*3/uL (ref 150–450)
RBC: 4.56 x10E6/uL (ref 3.77–5.28)
RDW: 12.6 % (ref 11.7–15.4)
WBC: 3.5 10*3/uL (ref 3.4–10.8)

## 2018-12-27 LAB — COMPREHENSIVE METABOLIC PANEL
ALT: 24 IU/L (ref 0–32)
AST: 22 IU/L (ref 0–40)
Albumin/Globulin Ratio: 1.7 (ref 1.2–2.2)
Albumin: 4.9 g/dL (ref 3.8–4.9)
Alkaline Phosphatase: 114 IU/L (ref 39–117)
BUN/Creatinine Ratio: 15 (ref 9–23)
BUN: 16 mg/dL (ref 6–24)
Bilirubin Total: 0.2 mg/dL (ref 0.0–1.2)
CO2: 28 mmol/L (ref 20–29)
Calcium: 9.9 mg/dL (ref 8.7–10.2)
Chloride: 100 mmol/L (ref 96–106)
Creatinine, Ser: 1.1 mg/dL — ABNORMAL HIGH (ref 0.57–1.00)
GFR calc Af Amer: 67 mL/min/{1.73_m2} (ref >59)
GFR calc non Af Amer: 58 mL/min/{1.73_m2} — ABNORMAL LOW (ref >59)
Globulin, Total: 2.9 g/dL (ref 1.5–4.5)
Glucose: 87 mg/dL (ref 65–99)
Potassium: 4.6 mmol/L (ref 3.5–5.2)
Sodium: 143 mmol/L (ref 134–144)
Total Protein: 7.8 g/dL (ref 6.0–8.5)

## 2018-12-27 LAB — LAMOTRIGINE LEVEL: Lamotrigine Lvl: 10.6 ug/mL (ref 2.0–20.0)

## 2018-12-27 LAB — LACOSAMIDE: Lacosamide: 13.7 ug/mL — ABNORMAL HIGH (ref 5.0–10.0)

## 2018-12-27 NOTE — Telephone Encounter (Signed)
LVM advising that the patient's labs look good. A random Vimpat level was elevated, but she has no signs of toxicity and was doing well. The NP will not make any change in dosing. Left number for any questions.

## 2019-01-03 DIAGNOSIS — Z0000 Encounter for general adult medical examination without abnormal findings: Secondary | ICD-10-CM | POA: Diagnosis not present

## 2019-01-03 DIAGNOSIS — S78119A Complete traumatic amputation at level between unspecified hip and knee, initial encounter: Secondary | ICD-10-CM | POA: Diagnosis not present

## 2019-01-10 ENCOUNTER — Telehealth (HOSPITAL_COMMUNITY): Payer: Self-pay | Admitting: Licensed Clinical Social Worker

## 2019-01-10 ENCOUNTER — Other Ambulatory Visit: Payer: Self-pay

## 2019-01-10 ENCOUNTER — Ambulatory Visit (HOSPITAL_COMMUNITY): Payer: Medicare Other | Admitting: Licensed Clinical Social Worker

## 2019-01-10 NOTE — Telephone Encounter (Signed)
Clinician attempted to contact [facility] for her scheduled appointment at 2:30, 2:40, 2:50 with no answer. Clinician left voicemail to return the call and reschedule.   No show.

## 2019-01-24 DIAGNOSIS — Z86718 Personal history of other venous thrombosis and embolism: Secondary | ICD-10-CM | POA: Diagnosis not present

## 2019-02-05 ENCOUNTER — Ambulatory Visit (INDEPENDENT_AMBULATORY_CARE_PROVIDER_SITE_OTHER): Payer: Medicare Other | Admitting: Psychiatry

## 2019-02-05 ENCOUNTER — Encounter (HOSPITAL_COMMUNITY): Payer: Self-pay | Admitting: Psychiatry

## 2019-02-05 ENCOUNTER — Other Ambulatory Visit: Payer: Self-pay

## 2019-02-05 DIAGNOSIS — F419 Anxiety disorder, unspecified: Secondary | ICD-10-CM

## 2019-02-05 DIAGNOSIS — F321 Major depressive disorder, single episode, moderate: Secondary | ICD-10-CM | POA: Diagnosis not present

## 2019-02-05 MED ORDER — VENLAFAXINE HCL ER 150 MG PO CP24: 150.0000 mg | ORAL_CAPSULE | Freq: Every day | ORAL | 0 refills | Status: DC
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
## 2019-02-07 ENCOUNTER — Ambulatory Visit (HOSPITAL_COMMUNITY): Payer: Medicare Other | Admitting: Psychiatry

## 2019-02-08 ENCOUNTER — Other Ambulatory Visit: Payer: Self-pay

## 2019-02-08 ENCOUNTER — Encounter (HOSPITAL_COMMUNITY): Payer: Self-pay | Admitting: Licensed Clinical Social Worker

## 2019-02-08 ENCOUNTER — Ambulatory Visit (INDEPENDENT_AMBULATORY_CARE_PROVIDER_SITE_OTHER): Payer: Medicare Other | Admitting: Licensed Clinical Social Worker

## 2019-02-08 DIAGNOSIS — F330 Major depressive disorder, recurrent, mild: Secondary | ICD-10-CM

## 2019-02-08 NOTE — Progress Notes (Signed)
Virtual Visit via Telephone Note  I connected with Cassandra Andrade on 02/08/19 at  2:30 PM EST by telephone and verified that I am speaking with the correct person using two identifiers.     I discussed the limitations, risks, security and privacy concerns of performing an evaluation and management service by telephone and the availability of in person appointments. I also discussed with the patient that there may be a patient responsible charge related to this service. The patient expressed understanding and agreed to proceed.   Type of Therapy: Individual Therapy  Treatment Goals addressed: improve psychiatric symptoms, elevate mood, Improve unhelpful thought patterns, interpersonal relationship skills, learn about diagnosis, healthy coping skills  Interventions: CBT and Motivational Interviewing, Grounding and Mindfulness Techniques, psychoeducation  Summary: Cassandra Andrade is a 53y.o. female who presents with Major Depressive Disorder, recurrent, mild  Suicidal/Homicidal: No -without intent/plan  Therapist Response: Cassandra Andrade met with clinician for an individual session. Dim discussed her psychiatric symptoms, her current life events and her homework. She sharedthat she just got her hair styled and was feeling really good about herself. She reports that she and mom have been getting along great and she continues to feel happy with her care arrangement. Cassandra Andrade also reported that she and mom have started really thinking about moving to [place] to be close to her sister. She reports she hopes this will happen in the next year or so. Clinician identified that moving to a new home close to her sister would add so much to her life, give her new places to explore, and would provide her a chance to start fresh. Clinician identified that whatever records that need to be transferred to new docs could be done with no problems.    Plan: Return again in4weeks.  Diagnosis: Axis I: Major Depressive  Disorder, recurrent,mild     I discussed the assessment and treatment plan with the patient. The patient was provided an opportunity to ask questions and all were answered. The patient agreed with the plan and demonstrated an understanding of the instructions.   The patient was advised to call back or seek an in-person evaluation if the symptoms worsen or if the condition fails to improve as anticipated.  I provided 40 minutes of non-face-to-face time during this encounter.   [name], LCSW

## 2019-02-20 ENCOUNTER — Telehealth: Payer: Self-pay | Admitting: Neurology

## 2019-02-20 NOTE — Telephone Encounter (Signed)
I would have her discuss with PCP to start.

## 2019-02-20 NOTE — Telephone Encounter (Signed)
I called and LMVM for Cassandra Andrade, mother of pt.  I relayed via VM that per SS/NP would have her discuss with her pcp to start.  They are to call back if questions.

## 2019-02-20 NOTE — Telephone Encounter (Signed)
I called pt.  She has AKA R.  She states that she has sharp shooting (like a hammer hitting top of her foot) causing jerking in the R leg.  This started yesterday.  No seizures has VNS (this is working appropriately).  I relayed not sure that this is neuro?  Will forward to SS/NP who has seen her last, Dr. [name] is out.

## 2019-02-20 NOTE — Telephone Encounter (Signed)
Patient called in and stated that on her amputated leg she has began to have pain , she says it feels as if she has been hit by a hammer and feels as if something is pressing on it really bad. She states when it hurts it becomes uncontrollable and she begins to jerk. She also has a stimulator in her chest.   CB# [phone]

## 2019-02-20 NOTE — Telephone Encounter (Signed)
Patient mother called back informed VM was left and states they will check VM and FU with any questions

## 2019-03-08 ENCOUNTER — Other Ambulatory Visit: Payer: Self-pay

## 2019-03-08 ENCOUNTER — Ambulatory Visit (INDEPENDENT_AMBULATORY_CARE_PROVIDER_SITE_OTHER): Payer: Medicare Other | Admitting: Licensed Clinical Social Worker

## 2019-03-08 ENCOUNTER — Encounter (HOSPITAL_COMMUNITY): Payer: Self-pay | Admitting: Licensed Clinical Social Worker

## 2019-03-08 DIAGNOSIS — F330 Major depressive disorder, recurrent, mild: Secondary | ICD-10-CM

## 2019-03-08 NOTE — Progress Notes (Signed)
Virtual Visit via Telephone Note  I connected with Cassandra Andrade on 03/08/19 at  2:30 PM EST by telephone and verified that I am speaking with the correct person using two identifiers.     I discussed the limitations, risks, security and privacy concerns of performing an evaluation and management service by telephone and the availability of in person appointments. I also discussed with the patient that there may be a patient responsible charge related to this service. The patient expressed understanding and agreed to proceed.  Type of Therapy: Individual Therapy  Treatment Goals addressed: improve psychiatric symptoms, elevate mood, Improve unhelpful thought patterns, interpersonal relationship skills, learn about diagnosis, healthy coping skills  Interventions: CBT and Motivational Interviewing, Grounding and Mindfulness Techniques, psychoeducation  Summary: Cassandra Andrade is a 54y.o. female who presents with Major Depressive Disorder, recurrent, mild  Suicidal/Homicidal: No -without intent/plan  Therapist Response: Cassandra Andrade met with clinician for an individual session. Cassandra Andrade discussed her psychiatric symptoms, her current life events and her homework. She sharedthatshe has been doing pretty well. She reports that she received a computer for Christmas and has been busy working on it and writing her life goals. Clinician explored the goals and noted high level interest in helping others through volunteering, particularly working with kids and animals. Clinician reflected the importance of this work and noted how this would impact Cassandra Andrade's sense of self worth. Clinician discussed options for starting her own volunteer group or joining another that is already established. Clinician explored Faye's progress toward walking. Cassandra Andrade reports she has been much more consistent in wearing her prosthetic leg daily.   Plan: Return again in4weeks.  Diagnosis: Axis I: Major Depressive Disorder,  recurrent,mild      I discussed the assessment and treatment plan with the patient. The patient was provided an opportunity to ask questions and all were answered. The patient agreed with the plan and demonstrated an understanding of the instructions.   The patient was advised to call back or seek an in-person evaluation if the symptoms worsen or if the condition fails to improve as anticipated.  I provided 45 minutes of non-face-to-face time during this encounter.   [name], LCSW

## 2019-03-11 ENCOUNTER — Other Ambulatory Visit: Payer: Self-pay | Admitting: Neurology

## 2019-03-20 ENCOUNTER — Telehealth: Payer: Self-pay | Admitting: *Deleted

## 2019-03-20 NOTE — Telephone Encounter (Signed)
Received Medical History Form, [facility] [employer] [facility]. Form placed on Butler Denmark, NP's desk for review, completion, signature.

## 2019-03-21 ENCOUNTER — Telehealth: Payer: Self-pay | Admitting: Neurology

## 2019-03-21 DIAGNOSIS — Z86718 Personal history of other venous thrombosis and embolism: Secondary | ICD-10-CM | POA: Diagnosis not present

## 2019-03-21 NOTE — Telephone Encounter (Signed)
I signed her form for social services, indicating her health is in a "good" state.

## 2019-03-21 NOTE — Telephone Encounter (Signed)
From S SLack, NP: I signed her form for social services, indicating her health is in a "good" state. Completed form sent to [facility] for processing.

## 2019-03-21 NOTE — Telephone Encounter (Signed)
See note dated 03/20/19.

## 2019-03-22 NOTE — Telephone Encounter (Signed)
SS forms were sent to Cassandra Andrade in [facility] yesterday.

## 2019-03-22 NOTE — Telephone Encounter (Signed)
Cassandra Andrade is calling in wanting to know if forms were faxed , if not she wants to know if they can be picked up   Fax# [phone] ( [place] fax to [employer])

## 2019-03-22 NOTE — Telephone Encounter (Signed)
I mail a copy to pt home address and I faxed a copy to [employer].

## 2019-03-23 DIAGNOSIS — H0102B Squamous blepharitis left eye, upper and lower eyelids: Secondary | ICD-10-CM | POA: Diagnosis not present

## 2019-03-23 DIAGNOSIS — H04123 Dry eye syndrome of bilateral lacrimal glands: Secondary | ICD-10-CM | POA: Diagnosis not present

## 2019-03-23 DIAGNOSIS — H35033 Hypertensive retinopathy, bilateral: Secondary | ICD-10-CM | POA: Diagnosis not present

## 2019-03-23 DIAGNOSIS — H2513 Age-related nuclear cataract, bilateral: Secondary | ICD-10-CM | POA: Diagnosis not present

## 2019-03-23 DIAGNOSIS — H47293 Other optic atrophy, bilateral: Secondary | ICD-10-CM | POA: Diagnosis not present

## 2019-03-23 DIAGNOSIS — H0102A Squamous blepharitis right eye, upper and lower eyelids: Secondary | ICD-10-CM | POA: Diagnosis not present

## 2019-03-23 DIAGNOSIS — H40013 Open angle with borderline findings, low risk, bilateral: Secondary | ICD-10-CM | POA: Diagnosis not present

## 2019-03-23 DIAGNOSIS — H353131 Nonexudative age-related macular degeneration, bilateral, early dry stage: Secondary | ICD-10-CM | POA: Diagnosis not present

## 2019-03-23 DIAGNOSIS — H25013 Cortical age-related cataract, bilateral: Secondary | ICD-10-CM | POA: Diagnosis not present

## 2019-04-05 DIAGNOSIS — R110 Nausea: Secondary | ICD-10-CM | POA: Diagnosis not present

## 2019-04-05 DIAGNOSIS — K219 Gastro-esophageal reflux disease without esophagitis: Secondary | ICD-10-CM | POA: Diagnosis not present

## 2019-04-05 DIAGNOSIS — S78119A Complete traumatic amputation at level between unspecified hip and knee, initial encounter: Secondary | ICD-10-CM | POA: Diagnosis not present

## 2019-04-05 DIAGNOSIS — E039 Hypothyroidism, unspecified: Secondary | ICD-10-CM | POA: Diagnosis not present

## 2019-04-05 DIAGNOSIS — I6990 Unspecified sequelae of unspecified cerebrovascular disease: Secondary | ICD-10-CM | POA: Diagnosis not present

## 2019-04-12 ENCOUNTER — Other Ambulatory Visit: Payer: Self-pay

## 2019-04-12 ENCOUNTER — Encounter (HOSPITAL_COMMUNITY): Payer: Self-pay | Admitting: Licensed Clinical Social Worker

## 2019-04-12 ENCOUNTER — Ambulatory Visit (INDEPENDENT_AMBULATORY_CARE_PROVIDER_SITE_OTHER): Payer: Medicare Other | Admitting: Licensed Clinical Social Worker

## 2019-04-12 DIAGNOSIS — F321 Major depressive disorder, single episode, moderate: Secondary | ICD-10-CM
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
## 2019-04-13 ENCOUNTER — Ambulatory Visit
Admission: RE | Admit: 2019-04-13 | Discharge: 2019-04-13 | Disposition: A | Discharge status: Home / Self Care | Payer: Medicare Other | Source: Ambulatory Visit | Attending: Nurse Practitioner | Admitting: Nurse Practitioner

## 2019-04-13 ENCOUNTER — Other Ambulatory Visit: Payer: Self-pay | Admitting: Nurse Practitioner

## 2019-04-13 DIAGNOSIS — Z89611 Acquired absence of right leg above knee: Secondary | ICD-10-CM | POA: Diagnosis not present

## 2019-04-13 DIAGNOSIS — T8789 Other complications of amputation stump: Secondary | ICD-10-CM

## 2019-04-13 DIAGNOSIS — M79651 Pain in right thigh: Secondary | ICD-10-CM | POA: Diagnosis not present

## 2019-04-13 DIAGNOSIS — M79604 Pain in right leg: Secondary | ICD-10-CM

## 2019-04-18 DIAGNOSIS — Z86718 Personal history of other venous thrombosis and embolism: Secondary | ICD-10-CM | POA: Diagnosis not present

## 2019-04-26 DIAGNOSIS — R7989 Other specified abnormal findings of blood chemistry: Secondary | ICD-10-CM | POA: Diagnosis not present

## 2019-04-26 DIAGNOSIS — D649 Anemia, unspecified: Secondary | ICD-10-CM | POA: Diagnosis not present

## 2019-05-03 ENCOUNTER — Ambulatory Visit (INDEPENDENT_AMBULATORY_CARE_PROVIDER_SITE_OTHER): Payer: Medicare Other | Admitting: Psychiatry

## 2019-05-03 ENCOUNTER — Other Ambulatory Visit: Payer: Self-pay

## 2019-05-03 ENCOUNTER — Encounter (HOSPITAL_COMMUNITY): Payer: Self-pay | Admitting: Psychiatry

## 2019-05-03 DIAGNOSIS — F419 Anxiety disorder, unspecified: Secondary | ICD-10-CM

## 2019-05-03 DIAGNOSIS — F339 Major depressive disorder, recurrent, unspecified: Secondary | ICD-10-CM

## 2019-05-03 DIAGNOSIS — F321 Major depressive disorder, single episode, moderate: Secondary | ICD-10-CM

## 2019-05-03 MED ORDER — VENLAFAXINE HCL ER 150 MG PO CP24: 150.0000 mg | ORAL_CAPSULE | Freq: Every day | ORAL | 0 refills | Status: DC

## 2019-05-03 NOTE — Progress Notes (Signed)
Virtual Visit via Telephone Note  I connected with Cassandra Andrade on 05/03/19 at  3:40 PM EST by telephone and verified that I am speaking with the correct person using two identifiers.   I discussed the limitations, risks, security and privacy concerns of performing an evaluation and management service by telephone and the availability of in person appointments. I also discussed with the patient that there may be a patient responsible charge related to this service. The patient expressed understanding and agreed to proceed.   History of Present Illness: Patient was evaluated by phone session.  Patient reported sometimes she feels sad because she cannot see her voice and they cannot visit her because of Covid.  She admitted missing the boys lately a lot.  Her mother told that this morning she had a seizure and I explained it could be a postictal phase that she has been sad and down.  She did not walk today with the prosthesis.  But she is in compliant with therapy and taking the medication.  She denies any highs and lows, anger, suicidal thoughts.  She reported sometimes lack of motivation and lack of energy to do things.  Her appetite is okay.  She sleeps good.  Her mother told that she had a computer and she can login and keep in touch with her voice through social media.  I encouraged back.  I also encouraged that she should discuss with the therapist about her missing voice.  I also encouraged that she should talk to them more frequently.  She agreed with the plan.  She does not want to change the medication at this time.   Past Psychiatric History:Reviewed. H/O inpatient in 2011 due to overdose on pain medication. No h/o mania or psychosis. Tried Zoloft and Remeron but do not remember. Took Lexapro and Abilify but stopped due to lack of response. Prozac worked for a long time until it stopped working.   Psychiatric Specialty Exam: Physical Exam  Review of Systems  There were no vitals taken  for this visit.There is no height or weight on file to calculate BMI.  General Appearance: NA  Eye Contact:  NA  Speech:  Clear and Coherent  Volume:  Normal  Mood:  Dysphoric  Affect:  NA  Thought Process:  Goal Directed  Orientation:  Full (Time, Place, and Person)  Thought Content:  Rumination  Suicidal Thoughts:  No  Homicidal Thoughts:  No  Memory:  Immediate;   Good Recent;   Fair Remote;   Fair  Judgement:  Intact  Insight:  Present  Psychomotor Activity:  NA  Concentration:  Concentration: Fair and Attention Span: Fair  Recall:  Good  Fund of Knowledge:  Fair  Language:  Good  Akathisia:  No  Handed:  Right  AIMS (if indicated):     Assets:  Communication Skills Desire for Improvement Housing Resilience Social Support  ADL's:  Intact  Cognition:  WNL  Sleep:   fair      Assessment and Plan: Major depressive disorder, recurrent.  Anxiety.  Reassurance given and encouraged to keep in contact with her voice through social media since she had computer.  We talked about postictal phase as patient has seizure this morning.  She appears down today.  Encouraged to keep appointment with therapy.  Continue Effexor XR 150 mg in the morning.  She has no tremors, shakes or any EPS.  She is taking Lamictal and seizure medicine from neurology.  Discussed medication side effects and benefits.  Recommended to call us back if she has any question or require early appointment otherwise we will follow-up in 3 months.  Follow Up Instructions:    I discussed the assessment and treatment plan with the patient. The patient was provided an opportunity to ask questions and all were answered. The patient agreed with the plan and demonstrated an understanding of the instructions.   The patient was advised to call back or seek an in-person evaluation if the symptoms worsen or if the condition fails to improve as anticipated.  I provided 20 minutes of non-face-to-face time during this  encounter.   Kathlee Nations, MD

## 2019-05-10 ENCOUNTER — Encounter (HOSPITAL_COMMUNITY): Payer: Self-pay | Admitting: Licensed Clinical Social Worker

## 2019-05-10 ENCOUNTER — Other Ambulatory Visit: Payer: Self-pay

## 2019-05-10 ENCOUNTER — Ambulatory Visit (INDEPENDENT_AMBULATORY_CARE_PROVIDER_SITE_OTHER): Payer: Medicare Other | Admitting: Licensed Clinical Social Worker

## 2019-05-10 DIAGNOSIS — F321 Major depressive disorder, single episode, moderate: Secondary | ICD-10-CM | POA: Diagnosis not present

## 2019-05-10 NOTE — Progress Notes (Signed)
Virtual Visit via Telephone Note  I connected with Cassandra Andrade on 05/10/19 at  2:30 PM EDT by telephone and verified that I am speaking with the correct person using two identifiers.     I discussed the limitations, risks, security and privacy concerns of performing an evaluation and management service by telephone and the availability of in person appointments. I also discussed with the patient that there may be a patient responsible charge related to this service. The patient expressed understanding and agreed to proceed.   Type of Therapy: Individual Therapy   Treatment Goals addressed: improve psychiatric symptoms, elevate mood, Improve unhelpful thought patterns, interpersonal relationship skills, learn about diagnosis, healthy coping skills   Interventions: CBT and psychoeducation   Summary: Cassandra Andrade is a 54 y.o. female who presents with Major Depressive Disorder, recurrent, moderate   Suicidal/Homicidal: No -without intent/plan   Therapist Response: Tanashia met with clinician for an individual session. Wynnie discussed her psychiatric symptoms, her current life events and her homework. Cassandra Andrade shared that she is feeling better lately, taking one day at a time, spending time with mom, and using her new computer. Clinician discussed progress toward walking, as well as mood issues that were present at last session. Clinician utilized [facility] OARS to reflect and summarize thoughts and feelings. Clinician explored interactions with sons and explored ways to increase communication. Clinician also identified the value of talking more to her sister, as she is a significant support.   Plan: Return again in 4 weeks.   Diagnosis: Axis I: Major Depressive Disorder, recurrent, moderate     I discussed the assessment and treatment plan with the patient. The patient was provided an opportunity to ask questions and all were answered. The patient agreed with the plan and demonstrated an understanding  of the instructions.   The patient was advised to call back or seek an in-person evaluation if the symptoms worsen or if the condition fails to improve as anticipated.  I provided 45 minutes of non-face-to-face time during this encounter.   Cassandra Curling, LCSW

## 2019-05-16 DIAGNOSIS — Z86718 Personal history of other venous thrombosis and embolism: Secondary | ICD-10-CM | POA: Diagnosis not present

## 2019-06-07 ENCOUNTER — Other Ambulatory Visit: Payer: Self-pay | Admitting: Neurology

## 2019-06-11 ENCOUNTER — Encounter (HOSPITAL_COMMUNITY): Payer: Self-pay | Admitting: Licensed Clinical Social Worker

## 2019-06-11 ENCOUNTER — Ambulatory Visit (INDEPENDENT_AMBULATORY_CARE_PROVIDER_SITE_OTHER): Payer: Medicare Other | Admitting: Licensed Clinical Social Worker

## 2019-06-11 ENCOUNTER — Other Ambulatory Visit: Payer: Self-pay

## 2019-06-11 DIAGNOSIS — F330 Major depressive disorder, recurrent, mild: Secondary | ICD-10-CM

## 2019-06-11 NOTE — Progress Notes (Signed)
Virtual Visit via Telephone Note  I connected with Cassandra Andrade on 06/11/19 at  1:30 PM EDT by telephone and verified that I am speaking with the correct person using two identifiers.    I discussed the limitations, risks, security and privacy concerns of performing an evaluation and management service by telephone and the availability of in person appointments. I also discussed with the patient that there may be a patient responsible charge related to this service. The patient expressed understanding and agreed to proceed.  Type of Therapy: Individual Therapy   Treatment Goals addressed: improve psychiatric symptoms, elevate mood, Improve unhelpful thought patterns, interpersonal relationship skills, learn about diagnosis, healthy coping skills   Interventions: CBT and psychoeducation   Summary: Cassandra Andrade is a 53 y.o. female who presents with Major Depressive Disorder, recurrent, mild   Suicidal/Homicidal: No -without intent/plan   Therapist Response: Dariah met with clinician for an individual session. Jeny discussed her psychiatric symptoms, her current life events and her homework. Fernanda reports she has been doing pretty well. Clinician explored mood, interactions with mom, updates on kids, etc. Clinician utilized CBT to normalize the rise and fall of moods throughout the day and reassured Zhara that this does not mean she has bipolar disorder. Clinician discussed coping skills and encouraged Mika to utilized deep breathing, movement, and talking to mom as positive outlet feelings.    Plan: Return again in 4 weeks.   Diagnosis: Axis I: Major Depressive Disorder, recurrent, mild         I discussed the assessment and treatment plan with the patient. The patient was provided an opportunity to ask questions and all were answered. The patient agreed with the plan and demonstrated an understanding of the instructions.   The patient was advised to call back or seek an in-person  evaluation if the symptoms worsen or if the condition fails to improve as anticipated.  I provided 45 minutes of non-face-to-face time during this encounter.    R , LCSW   

## 2019-06-13 DIAGNOSIS — Z86718 Personal history of other venous thrombosis and embolism: Secondary | ICD-10-CM | POA: Diagnosis not present

## 2019-06-14 ENCOUNTER — Other Ambulatory Visit: Payer: Self-pay | Admitting: Neurology

## 2019-06-15 ENCOUNTER — Telehealth: Payer: Self-pay | Admitting: Neurology

## 2019-06-15 NOTE — Telephone Encounter (Signed)
Pt is needing a refill on her lacosamide (VIMPAT) 200 MG TABS tablet sent in to the [facility] on [address].

## 2019-06-16 ENCOUNTER — Other Ambulatory Visit: Payer: Self-pay | Admitting: Neurology

## 2019-06-18 NOTE — Telephone Encounter (Signed)
This was sen to SS/NP for refill.

## 2019-06-18 NOTE — Telephone Encounter (Signed)
Next app 07-02-19 with Dr. [name].

## 2019-06-18 NOTE — Telephone Encounter (Signed)
Pt is a refill for Vimpat sent to [facility] stating she is almost out

## 2019-06-23 ENCOUNTER — Other Ambulatory Visit: Payer: Self-pay | Admitting: Neurology

## 2019-06-23 ENCOUNTER — Other Ambulatory Visit (HOSPITAL_COMMUNITY): Payer: Self-pay | Admitting: Psychiatry

## 2019-06-23 DIAGNOSIS — F321 Major depressive disorder, single episode, moderate: Secondary | ICD-10-CM

## 2019-06-23 DIAGNOSIS — F419 Anxiety disorder, unspecified: Secondary | ICD-10-CM

## 2019-07-02 ENCOUNTER — Ambulatory Visit (INDEPENDENT_AMBULATORY_CARE_PROVIDER_SITE_OTHER): Payer: Medicare Other | Admitting: Neurology

## 2019-07-02 ENCOUNTER — Encounter: Payer: Self-pay | Admitting: Neurology

## 2019-07-02 VITALS — BP 111/70 | HR 98 | Temp 97.2°F

## 2019-07-02 DIAGNOSIS — R269 Unspecified abnormalities of gait and mobility: Secondary | ICD-10-CM

## 2019-07-02 DIAGNOSIS — G40919 Epilepsy, unspecified, intractable, without status epilepticus: Secondary | ICD-10-CM | POA: Diagnosis not present

## 2019-07-02 NOTE — Progress Notes (Signed)
Reason for visit: Intractable seizures  Cassandra Andrade is an 54 y.o. 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  The patient has a right above-knee amputation, she has a prosthesis for this but she believes that her ability to walk has declined somewhat over the last several months, she has not had any further falls.  She tries to do exercises.  The patient is still having relatively frequent seizures, but most of her seizures she can abort with the vagal nerve stimulator.  She returns for further evaluation.  She remains on Lamictal, Vimpat, and Keppra.  She tolerates medications well.  Past Medical History:  Diagnosis Date  . Anxiety   . Arthritis   . Cancer (HCC)    Osteosarcoma, right leg  . CVA (cerebrovascular accident due to intracerebral hemorrhage) ([place]) 2004  . Depression   . DVT (deep venous thrombosis) (HCC)    Left leg  . MRSA (methicillin resistant Staphylococcus aureus)   . 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 Seizures ([place])   . Thyroid disease   . 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 ABOVE KNEE LEG AMPUTATION Right    Osteosarcoma  . BRAIN SURGERY  2004   cerebral aneurysm  . FRACTURE SURGERY     Rt. femur, folllowed by MRSA  . TOTAL KNEE ARTHROPLASTY Right     Family History  Problem Relation Age of Onset  . Anxiety disorder Mother   . Diabetes Father     Social history:  reports that she has never smoked. She has never used smokeless tobacco. She reports that she does not drink alcohol or use drugs.    Allergies  Allergen Reactions  . Vicodin [Hydrocodone-Acetaminophen] Nausea And Vomiting  . Morphine And Related Rash    Allergic reaction only in iv form    Medications:  Prior to Admission medications   Medication Sig Start Date End  Date Taking? Authorizing Provider  acetaminophen (TYLENOL) 500 MG tablet Take 1,000 mg by mouth every 8 (eight) hours as needed for mild pain or headache.    Yes [provider]  Ascorbic Acid (VITAMIN C) 1000 MG tablet Take 1,000 mg by mouth daily.  08/17/11  Yes [provider]  Cholecalciferol (VITAMIN D) 50 MCG (2000 UT) CAPS Take 1 capsule by mouth daily.   Yes [provider]  lamoTRIgine (LAMICTAL) 200 MG tablet TAKE 1 TABLET BY MOUTH TWICE A DAY 06/07/19  Yes Suzzanne Cloud, NP  lamoTRIgine (LAMICTAL) 25 MG tablet TAKE 2 TABLETS BY MOUTH TWICE A DAY 06/25/19  Yes Kathrynn Ducking, MD  levETIRAcetam (KEPPRA) 1000 MG tablet TAKE 2 TABLETS (2,000 MG TOTAL) BY MOUTH 2 (TWO) TIMES DAILY. 06/18/19  Yes Suzzanne Cloud, NP  levothyroxine (SYNTHROID, LEVOTHROID) 50 MCG tablet Take 50 mcg by mouth daily before breakfast.   Yes [provider]  Midazolam 5 MG/0.1ML SOLN Place 0.1 mLs into the nose once as needed for up to 1 dose (prolonged seizure). 03/23/18  Yes Kathrynn Ducking, MD  omeprazole (PRILOSEC) 20 MG capsule Take 20 mg by mouth daily. 06/12/19  Yes [provider]  Prenatal Vit-Fe Fumarate-FA (PRENATAL MULTIVITAMIN) TABS tablet Take 1 tablet by mouth daily at 12 noon.   Yes [provider]  promethazine (PHENERGAN)  25 MG tablet Take 25 mg by mouth every 6 (six) hours as needed for nausea.    Yes [provider]  promethazine (PHENERGAN) 25 MG tablet Take by mouth. 04/27/19  Yes [provider]  venlafaxine XR (EFFEXOR-XR) 150 MG 24 hr capsule Take 1 capsule (150 mg total) by mouth daily with breakfast. 05/03/19  Yes Arfeen, Arlyce Harman, MD  VIMPAT 200 MG TABS tablet TAKE 1 TABLET BY MOUTH TWICE A DAY 06/18/19  Yes Suzzanne Cloud, NP  warfarin (COUMADIN) 5 MG tablet Take 1.5-2 tablets (7.5-10 mg total) by mouth See admin instructions. Pt takes one and a half tablets (7.5mg  total) on Sunday and Wednesday and 2 tablets (10mg  total) all  other days. Patient taking differently: Take 7.5-10 mg by mouth See admin instructions. Pt takes 7.5mg  on Sunday and Wednesday and 10mg  all other days. 01/03/18  Yes Thurnell Lose, MD    ROS:  Out of a complete 14 system review of symptoms, the patient complains only of the following symptoms, and all other reviewed systems are negative.  Seizures Difficulty walking  Blood pressure 111/70, pulse 98, temperature (!) 97.2 F (36.2 C).  Physical Exam  General: The patient is alert and cooperative at the time of the examination.  Skin: No significant peripheral edema is noted.  The patient has a right above-knee amputation.   Neurologic Exam  Mental status: The patient is alert and oriented x 3 at the time of the examination. The patient has apparent normal recent and remote memory, with an apparently normal attention span and concentration ability.   Cranial nerves: Facial symmetry is present. Speech is normal, no aphasia or dysarthria is noted. Extraocular movements are full, with exception there is some limitation of superior gaze, with attempted up gaze, there is exotropia of the right eye. Visual fields are full.  Motor: The patient has good strength in all 4 extremities.  Sensory examination: Soft touch sensation is symmetric on the face, arms, and legs.  Coordination: The patient has good finger-nose-finger bilaterally and toe to finger on the left.  Gait and station: The patient is in a wheelchair, she did not bring her prosthesis with her today, she cannot be ambulated.  Reflexes: Deep tendon reflexes are symmetric.   Assessment/Plan:  1.  Intractable seizures  2.  Gait disorder, right above-knee amputation  The patient will be sent for physical therapy as she has had some increased difficulty with walking with her prosthesis.  She will remain on her current dosing regimen of medications.  She will follow-up here in 6 months.  Jill Alexanders MD 07/02/2019  10:38 AM  [facility] [address] [place], [place] [address]  Phone [phone] Fax [phone]

## 2019-07-09 ENCOUNTER — Other Ambulatory Visit: Payer: Self-pay

## 2019-07-09 ENCOUNTER — Ambulatory Visit (INDEPENDENT_AMBULATORY_CARE_PROVIDER_SITE_OTHER): Payer: Medicare Other | Admitting: Licensed Clinical Social Worker

## 2019-07-09 DIAGNOSIS — F330 Major depressive disorder, recurrent, mild: Secondary | ICD-10-CM

## 2019-07-10 ENCOUNTER — Encounter (HOSPITAL_COMMUNITY): Payer: Self-pay | Admitting: Licensed Clinical Social Worker

## 2019-07-10 NOTE — Progress Notes (Signed)
Virtual Visit via Telephone Note  I connected with Cassandra Andrade on 07/10/19 at  1:30 PM EDT by telephone and verified that I am speaking with the correct person using two identifiers.    I discussed the limitations, risks, security and privacy concerns of performing an evaluation and management service by telephone and the availability of in person appointments. I also discussed with the patient that there may be a patient responsible charge related to this service. The patient expressed understanding and agreed to proceed.  Type of Therapy: Individual Therapy   Treatment Goals addressed: improve psychiatric symptoms, elevate mood, Improve unhelpful thought patterns, interpersonal relationship skills, learn about diagnosis, healthy coping skills   Interventions: CBT    Summary: Cassandra Andrade is a 54 y.o. female who presents with Major Depressive Disorder, recurrent, mild   Suicidal/Homicidal: No -without intent/plan   Therapist Response: Cassandra Andrade met with clinician for an individual session. Cassandra Andrade discussed her psychiatric symptoms, her current life events and her homework. Cassandra Andrade shared that things are good. Clinician explored moods and interactions. Clinician noted increased activity and practice with walking/physical therapy. Clinician explored changes in mood and motivation to get up and walking again. Clinician utilized CBT to process thoughts, feelings, and behaviors associated with triggers of frustration. Clinician encouraged coping skills to continue to be utilized, such as deep breathing, time alone, and writing in a journal/coloring.  Plan: Return again in 4 weeks.   Diagnosis: Axis I: Major Depressive Disorder, recurrent, mild         I discussed the assessment and treatment plan with the patient. The patient was provided an opportunity to ask questions and all were answered. The patient agreed with the plan and demonstrated an understanding of the instructions.   The patient  was advised to call back or seek an in-person evaluation if the symptoms worsen or if the condition fails to improve as anticipated.  I provided 45 minutes of non-face-to-face time during this encounter.   [name], LCSW

## 2019-07-11 DIAGNOSIS — Z86718 Personal history of other venous thrombosis and embolism: Secondary | ICD-10-CM | POA: Diagnosis not present

## 2019-07-14 ENCOUNTER — Other Ambulatory Visit (HOSPITAL_COMMUNITY): Payer: Self-pay | Admitting: Psychiatry

## 2019-07-14 DIAGNOSIS — F419 Anxiety disorder, unspecified: Secondary | ICD-10-CM

## 2019-07-14 DIAGNOSIS — F321 Major depressive disorder, single episode, moderate: Secondary | ICD-10-CM

## 2019-07-19 ENCOUNTER — Other Ambulatory Visit (HOSPITAL_COMMUNITY): Payer: Self-pay | Admitting: *Deleted

## 2019-07-19 DIAGNOSIS — F419 Anxiety disorder, unspecified: Secondary | ICD-10-CM

## 2019-07-19 DIAGNOSIS — F321 Major depressive disorder, single episode, moderate: Secondary | ICD-10-CM

## 2019-07-19 MED ORDER — VENLAFAXINE HCL ER 150 MG PO CP24: 150.0000 mg | ORAL_CAPSULE | Freq: Every day | ORAL | 0 refills | Status: DC

## 2019-07-21 ENCOUNTER — Other Ambulatory Visit (HOSPITAL_COMMUNITY): Payer: Self-pay | Admitting: Psychiatry

## 2019-07-21 DIAGNOSIS — F419 Anxiety disorder, unspecified: Secondary | ICD-10-CM

## 2019-07-21 DIAGNOSIS — F321 Major depressive disorder, single episode, moderate: Secondary | ICD-10-CM

## 2019-07-26 ENCOUNTER — Other Ambulatory Visit (HOSPITAL_COMMUNITY): Payer: Self-pay | Admitting: *Deleted

## 2019-07-26 DIAGNOSIS — F419 Anxiety disorder, unspecified: Secondary | ICD-10-CM

## 2019-07-26 DIAGNOSIS — F321 Major depressive disorder, single episode, moderate: Secondary | ICD-10-CM

## 2019-07-26 MED ORDER — VENLAFAXINE HCL ER 150 MG PO CP24: 150.0000 mg | ORAL_CAPSULE | Freq: Every day | ORAL | 2 refills | Status: DC

## 2019-08-06 ENCOUNTER — Encounter (HOSPITAL_COMMUNITY): Payer: Self-pay | Admitting: Psychiatry

## 2019-08-06 ENCOUNTER — Telehealth (INDEPENDENT_AMBULATORY_CARE_PROVIDER_SITE_OTHER): Payer: Medicare Other | Admitting: Psychiatry

## 2019-08-06 ENCOUNTER — Other Ambulatory Visit: Payer: Self-pay

## 2019-08-06 DIAGNOSIS — F321 Major depressive disorder, single episode, moderate: Secondary | ICD-10-CM | POA: Diagnosis not present

## 2019-08-06 DIAGNOSIS — F419 Anxiety disorder, unspecified: Secondary | ICD-10-CM

## 2019-08-06 MED ORDER — VENLAFAXINE HCL ER 150 MG PO CP24: 150.0000 mg | ORAL_CAPSULE | Freq: Every day | ORAL | 0 refills | Status: DC

## 2019-08-06 NOTE — Progress Notes (Signed)
Virtual Visit via Video Note  I connected with Cassandra Andrade on 08/06/19 at 11:00 AM EDT by a video enabled telemedicine application and verified that I am speaking with the correct person using two identifiers.   I discussed the limitations of evaluation and management by telemedicine and the availability of in person appointments. The patient expressed understanding and agreed to proceed.  Patient location; home Provider location; home office   History of Present Illness: Patient is evaluated by video session.  Her mother also close by.  She is excited because her 53 year old and 105 year old son came to visit her.  Currently her sisters kids are visiting and she is very happy with the company.  This morning she had a seizure and she is little bit tremulous and her hands were shaking.  Her mother is concerned about today seizure which is usually longer than normal.  Patient described her mood is mostly stable but sometimes she does get depressed but lack of motivation to do things.  However she started using her prosthesis more than usual.  She started going out and doing the activities that can keep her self busy.  She also trying to be more active on social media.  She has a computer now.  Her energy level is better than before.  Her appetite is okay.  She is sleeping good.  She denies any paranoia, hallucination or any suicidal thoughts.  She actually saw her ex-husband who came to help to replace the fridge.  Patient reported it did go well but they did not talk in details.  Patient lives with her mother who is very supportive.  Patient denies any substance use.  She denies any panic attacks.   Past Psychiatric History:Reviewed. H/O inpatientin 2011 due to overdose on pain medication.No h/omania or psychosis. Tried Zoloft and Remeron but do not remember. Took Lexapro and Abilify but stopped due to lack of response. Prozac worked for a long time until it stopped working.   Psychiatric  Specialty Exam: Physical Exam  Review of Systems  There were no vitals taken for this visit.There is no height or weight on file to calculate BMI.  General Appearance: Casual  Eye Contact:  Good  Speech:  Clear and Coherent  Volume:  Normal  Mood:  Euthymic  Affect:  Congruent  Thought Process:  Goal Directed  Orientation:  Full (Time, Place, and Person)  Thought Content:  Logical  Suicidal Thoughts:  No  Homicidal Thoughts:  No  Memory:  Immediate;   Good Recent;   Fair Remote;   Fair  Judgement:  Intact  Insight:  Present  Psychomotor Activity:  Tremor  Concentration:  Concentration: Fair and Attention Span: Fair  Recall:  Good  Fund of Knowledge:  Good  Language:  Good  Akathisia:  No  Handed:  Right  AIMS (if indicated):     Assets:  Communication Skills Desire for Improvement Housing Resilience Social Support  ADL's:  Intact  Cognition:  WNL  Sleep:   ok      Assessment and Plan: Trigger depressive disorder, recurrent.  Anxiety.  This morning patient has seizures and mother is concerned because duration was long then normal.  I recommend that she should contact Dr. [name] who is managing her seizures.  Patient admitted some time having dysphoria and sadness but denies any crying spells or any feeling of hopelessness or suicidal thoughts.  She is on Lamictal and I recommend that she should discuss with neurologist that if Lamictal dose can  be increased to help the mood and seizures.  She agreed and she will call the neurologist.  We will continue Effexor 150 mg daily.  I am reluctant to increase the Effexor as it may cause change in seizure threshold.  Encouraged to continue therapy with Janett Billow.  Recommended to call us back if she has any question or any concern.  I will forward my notes to her neurologist Dr. [name].  Follow-up in 3 months.  Unafraid increasing the Effexor  Follow Up Instructions:    I discussed the assessment and treatment plan with the patient.  The patient was provided an opportunity to ask questions and all were answered. The patient agreed with the plan and demonstrated an understanding of the instructions.   The patient was advised to call back or seek an in-person evaluation if the symptoms worsen or if the condition fails to improve as anticipated.  I provided 20 minutes of non-face-to-face time during this encounter.   Kathlee Nations, MD

## 2019-08-07 ENCOUNTER — Ambulatory Visit (INDEPENDENT_AMBULATORY_CARE_PROVIDER_SITE_OTHER): Payer: Medicare Other | Admitting: Licensed Clinical Social Worker

## 2019-08-07 ENCOUNTER — Encounter (HOSPITAL_COMMUNITY): Payer: Self-pay | Admitting: Licensed Clinical Social Worker

## 2019-08-07 ENCOUNTER — Other Ambulatory Visit: Payer: Self-pay

## 2019-08-07 DIAGNOSIS — F330 Major depressive disorder, recurrent, mild: Secondary | ICD-10-CM

## 2019-08-07 NOTE — Progress Notes (Signed)
Virtual Visit via Telephone Note  I connected with Cassandra Andrade on 08/07/19 at  2:30 PM EDT by telephone and verified that I am speaking with the correct person using two identifiers.  Location: Patient: home Provider: office   I discussed the limitations, risks, security and privacy concerns of performing an evaluation and management service by telephone and the availability of in person appointments. I also discussed with the patient that there may be a patient responsible charge related to this service. The patient expressed understanding and agreed to proceed.  Type of Therapy: Individual Therapy   Treatment Goals addressed: improve psychiatric symptoms, elevate mood, Improve unhelpful thought patterns, interpersonal relationship skills, learn about diagnosis, healthy coping skills   Interventions: Motivational Interviewing   Summary: Cassandra Andrade is a 54 y.o. female who presents with Major Depressive Disorder, recurrent, mild   Suicidal/Homicidal: No -without intent/plan   Therapist Response: Cassandra Andrade met with clinician for an individual session. Cassandra Andrade discussed her psychiatric symptoms, her current life events and her homework. Cassandra Andrade reported that things have been going better today. She reports that she is spending more time with family and this is really helping her feel motivated for life. Clinician utilized MI OARS to reflect and summarize thoughts and feelings about her health, her mood, and her interactions. Clinician processed ways to utilized positive self talk on down days in order to lift her up. Clinician also identified the importance of getting up daily, putting on her leg, and getting some movement on a daily routine.    Plan: Return again in 4 weeks.   Diagnosis: Axis I: Major Depressive Disorder, recurrent, mild       I discussed the assessment and treatment plan with the patient. The patient was provided an opportunity to ask questions and all were answered. The  patient agreed with the plan and demonstrated an understanding of the instructions.   The patient was advised to call back or seek an in-person evaluation if the symptoms worsen or if the condition fails to improve as anticipated.  I provided 25 minutes of non-face-to-face time during this encounter.   [name], LCSW

## 2019-08-08 DIAGNOSIS — Z86718 Personal history of other venous thrombosis and embolism: Secondary | ICD-10-CM | POA: Diagnosis not present

## 2019-08-29 ENCOUNTER — Ambulatory Visit: Payer: Medicare Other | Attending: Nurse Practitioner | Admitting: Rehabilitation

## 2019-08-29 ENCOUNTER — Encounter: Payer: Self-pay | Admitting: Rehabilitation

## 2019-08-29 ENCOUNTER — Other Ambulatory Visit: Payer: Self-pay

## 2019-08-29 DIAGNOSIS — M25651 Stiffness of right hip, not elsewhere classified: Secondary | ICD-10-CM | POA: Insufficient documentation

## 2019-08-29 DIAGNOSIS — M6281 Muscle weakness (generalized): Secondary | ICD-10-CM | POA: Diagnosis not present

## 2019-08-29 DIAGNOSIS — R2689 Other abnormalities of gait and mobility: Secondary | ICD-10-CM

## 2019-08-29 DIAGNOSIS — R2681 Unsteadiness on feet: Secondary | ICD-10-CM

## 2019-08-29 NOTE — Therapy (Signed)
[facility] [address] [place], [place], [address] Phone: [phone]   Fax:  [phone]  Physical Therapy Evaluation  Patient Details  Name: Edit Ricciardelli MRN: [id number] Date of Birth: May 02, 1965 Referring Provider (PT): Margette Fast, MD   Encounter Date: 08/29/2019   PT End of Session - 08/29/19 1459    Visit Number 1    Number of Visits 17    Date for PT Re-Evaluation 10/28/19    Authorization Type Medicare, Medicaid, Tricare    PT Start Time 1404    PT Stop Time 1500    PT Time Calculation (min) 56 min    Activity Tolerance Patient tolerated treatment well    Behavior During Therapy [facility] for tasks assessed/performed           Past Medical History:  Diagnosis Date  . Anxiety   . Arthritis   . Cancer (HCC)    Osteosarcoma, right leg  . CVA (cerebrovascular accident due to intracerebral hemorrhage) ([place]) 2004  . Depression   . DVT (deep venous thrombosis) (HCC)    Left leg  . MRSA (methicillin resistant Staphylococcus aureus)   . 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 Seizures ([place])   . Thyroid disease   . 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 ABOVE KNEE LEG AMPUTATION Right    Osteosarcoma  . BRAIN SURGERY  2004   cerebral aneurysm  . FRACTURE SURGERY     Rt. femur, folllowed by MRSA  . TOTAL KNEE ARTHROPLASTY Right     There were no vitals filed for this visit.    Subjective Assessment - 08/29/19 1326    Subjective Pt reports is not where she wants to be with walking, with balance, and everything.    Patient is accompained by: Family member   mother [name]   Limitations Walking;House hold activities;Standing    How long can you stand comfortably? approx 30 mins per pt report    How long can you walk comfortably? 15-20 mins per pt report    Patient Stated Goals To walk with a cane if  possible and get my balance better.    Currently in Pain? No/denies              [facility] PT Assessment - 08/29/19 0001      Assessment   Medical Diagnosis gait abnormality (old R TFA)    Referring Provider (PT) Margette Fast, MD    Onset Date/Surgical Date --   Amp was in 2014   Prior Therapy OP in 2018      Precautions   Precautions Fall      Balance Screen   Has the patient fallen in the past 6 months Yes    How many times? 1    Has the patient had a decrease in activity level because of a fear of falling?  No    Is the patient reluctant to leave their home because of a fear of falling?  No      Home Social worker Private residence    Living Arrangements Parent    Available Help at Discharge Family;Available 24 hours/day    Type of [place] One level    [place] - 2 wheels;Bedside commode;Tub bench;Wheelchair - manual;Hospital bed   tub/shower  Additional Comments Pt is mostly non ambulatory and scoots on buttocks for mobility at home      Prior Function   Level of Independence --   mostly scoot around house on bottom   Leisure Get on the computer, word finds      Cognition   Overall Cognitive Status History of cognitive impairments - at baseline    Memory Impaired    Memory Impairment Decreased short term memory      Observation/Other Assessments   Observations Has L sided peripheral vision deficits       Sensation   Light Touch Appears Intact      Coordination   Gross Motor Movements are Fluid and Coordinated Yes    Fine Motor Movements are Fluid and Coordinated Yes      ROM / Strength   AROM / PROM / Strength Strength      Strength   Overall Strength Deficits    Overall Strength Comments LLE WFL (generalized weakness noted), R hip flex 4/5, R hip ext 3/5, R hip abd 4/5 (seated-likely weaker due to gait abnormalities), hip add 4/5      Transfers   Transfers Sit to  Stand;Stand to Sit    Sit to Stand 5: Supervision    Sit to Stand Details Verbal cues for sequencing;Verbal cues for technique;Verbal cues for precautions/safety    Sit to Stand Details (indicate cue type and reason) Min cues for donning prosthesis, using valve to release air somewhat prior to standing and releasing remaining air.      Stand to Sit 5: Supervision;4: Min guard    Stand to Sit Details (indicate cue type and reason) Verbal cues for sequencing;Verbal cues for technique;Verbal cues for precautions/safety      Ambulation/Gait   Ambulation/Gait Yes    Ambulation/Gait Assistance 4: Min guard    Ambulation Distance (Feet) 100 Feet    Assistive device Rolling walker;Prosthesis    Gait Pattern Step-through pattern;Decreased stance time - right;Decreased step length - left;Decreased stride length;Decreased weight shift to right;Right circumduction;Lateral hip instability;Narrow base of support    Ambulation Surface Level;Indoor    Gait velocity 26.81 secs=1.22 ft/sec with RW and prosthesis      Standardized Balance Assessment   Standardized Balance Assessment Berg Balance Test      Berg Balance Test   Sit to Stand Able to stand  independently using hands    Standing Unsupported Able to stand 30 seconds unsupported    Sitting with Back Unsupported but Feet Supported on Floor or Stool Able to sit safely and securely 2 minutes    Stand to Sit Controls descent by using hands    Transfers Able to transfer with verbal cueing and /or supervision    Standing Unsupported with Eyes Closed Needs help to keep from falling    Standing Unsupported with Feet Together Needs help to attain position but able to stand for 30 seconds with feet together    From Standing, Reach Forward with Outstretched Arm Reaches forward but needs supervision    From Standing Position, Pick up Object from Floor Unable to try/needs assist to keep balance    From Standing Position, Turn to Look Behind Over each Shoulder  Turn sideways only but maintains balance    Turn 360 Degrees Needs assistance while turning    Standing Unsupported, Alternately Place Feet on Step/Stool Needs assistance to keep from falling or unable to try    Standing Unsupported, One Foot in [employer] balance while stepping  or standing    Standing on One Leg Unable to try or needs assist to prevent fall    Total Score 18    Berg comment: < 36 high risk for falls (close to 100%)            Prosthetics Assessment - 08/29/19 0001      Prosthetics   Prosthetic Care Dependent with Skin check;Residual limb care;Prosthetic cleaning;Ply sock cleaning;Correct ply sock adjustment;Proper wear schedule/adjustment;Proper weight-bearing schedule/adjustment    Prosthetic Care Comments  Pt only has 1 liner and does not wear prosthesis very often and when not in prosthesis does not wear liner nor shrinker.      Donning prosthesis  Min assist    Doffing prosthesis  Supervision    Current prosthetic wear tolerance (days/week)  not wearing at home    Current prosthetic wear tolerance (#hours/day)  not wearing at home    Current prosthetic weight-bearing tolerance (hours/day)  Able to tolerate 90' (approx 5-6 mins of WB)     Edema pt with no edema but limb not shaped well with adipose tissue    Residual limb condition  Skin on residual limb has no open areas, good hair growth, normal temperature, well-healed non-adhered scar.  Skin does go in towards scar, but no visible wounds    Prosthesis Description Suction ring with valve socket with toe unlocking mechanism at knee (has foam over knee, so unable to see)     K code/activity level with prosthetic use  --                     Objective measurements completed on examination: See above findings.               PT Education - 08/29/19 1458    Education Details Education on eval findings, POC, goals, initial wear time, wearing shrinker/liner at all times when not wearing leg,  prosthetic care, getting appt with [employer]    Person(s) Educated Patient;Parent(s)    Methods Explanation    Comprehension Verbalized understanding            PT Short Term Goals - 08/29/19 1511      PT SHORT TERM GOAL #1   Title Pt will be don/doff prosthesis at S level and tolerate wearing prosthesis 5 hours, 2 times per day without skin issues or discomfort.  (Target Date: 09/28/19)    Baseline Donning (not properly) and not wearing consistently daily    Time 4    Period Weeks    Status New    Target Date 09/28/19      PT SHORT TERM GOAL #2   Title Pt will be independent with sink exercises/other HEP (with S from mother as needed) in order to indicate improved functional mobility.    Time 4    Period Weeks    Status New      PT SHORT TERM GOAL #3   Title Pt will improve gait speed to >/=1.82 ft/sec with RW/prosthesis in order to indicate decreased fall risk.    Time 4    Period Weeks    Status New      PT SHORT TERM GOAL #4   Title Pt will ambulate x 56' with RW in simulated household environment at S level in order to indicate safety in home.    Time 4    Period Weeks    Status New      PT SHORT TERM GOAL #5   Title Pt  will improve BERG balance test to >/=22/56 in order to indicate decreased fall risk.    Time 4    Period Weeks    Status New             PT Long Term Goals - 08/29/19 1528      PT LONG TERM GOAL #1   Title Pt will be able to don/doff prosthesis at S level and tolerate wear of prosthesis >75% of awake hours without change in skin integrity or tenderness.  (Target Date: 10/28/19)    Time 8    Period Weeks    Status New    Target Date 10/28/19      PT LONG TERM GOAL #2   Title Pt will improve gait speed to >/=2.42 ft/sec with LRAD and prosthesis in order to indicate safer community ambulation.    Time 8    Period Weeks    Status New      PT LONG TERM GOAL #3   Title Pt will ambulate x 300' with LRAD at S level over indoor and unlevel paved  outdoor surfaces in order to indicate safe home and limited community ambulation.    Time 8    Period Weeks    Status New      PT LONG TERM GOAL #4   Title Pt will improve BERG score to >/=26/56 in order to indicate decreased fall risk.    Time 8    Period Weeks    Status New      PT LONG TERM GOAL #5   Title Standing balance with rolling walker and prosthesis reaching 10" anteriorly, across midline and to floor with S.    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  Upon PT evaluation, note that she reports she does not wear prosthesis very often and gets around via scooting and rarely leaves her home, but she does well leg when mother takes her out of home.  Note issues with donning prosthesis during session which PT educated on and advised not to wear lotion to don liner.  Also provided education on sock ply adjustment and initiating a wear/weight bearing schedule.  Pts mother provided with this education as well (see pt instruction).  Note gait speed of 1.22 ft/sec with RW/prosthesis which indicates recurrent falls risk and a BERG balance score of 16/56 which is indicates high risk for falls as well. Pt will benefit from skilled OP neuro PT in order to address deficits.    Personal Factors and Comorbidities Comorbidity 3+;Time since onset of injury/illness/exacerbation    Comorbidities see above    Examination-Activity Limitations Bathing;Bend;Dressing;Hygiene/Grooming;Locomotion Level;Reach Overhead;Stairs;Stand;Transfers    Examination-Participation Restrictions Community Activity;Laundry;Shop;Meal Prep    Stability/Clinical Decision Making Evolving/Moderate complexity    Clinical Decision Making Moderate    Rehab Potential Good    PT Frequency 2x / week     PT Duration 8 weeks    PT Treatment/Interventions ADLs/Self Care Home Management;DME Instruction;Gait training;Stair training;Functional mobility training;Therapeutic activities;Therapeutic exercise;Balance training;Neuromuscular re-education;Cognitive remediation;Patient/family education;Prosthetic Training;Passive range of motion;Energy conservation;Vestibular    PT Next Visit Plan Check skin, has she been wearing 2hrs/2x/day?? If skin looking good, can we increase  to 3 hours/2x/day?? Is she better able to don prosthesis (work on decreased hip retraction during donning), educate on sock ply adjustment, provide sink exercises and go over with pt/mother.    Recommended Other Services memory is poor so if doing exercises have mother present    Consulted and Agree with Plan of Care Patient;Family member/caregiver    Family Member Consulted mother           Patient will benefit from skilled therapeutic intervention in order to improve the following deficits and impairments:  Abnormal gait, Decreased activity tolerance, Decreased balance, Decreased cognition, Decreased endurance, Decreased knowledge of precautions, Decreased knowledge of use of DME, Decreased mobility, Decreased range of motion, Decreased safety awareness, Decreased strength, Difficulty walking, Increased edema, Impaired perceived functional ability, Impaired flexibility, Postural dysfunction  Visit Diagnosis: Unsteadiness on feet  Other abnormalities of gait and mobility  Muscle weakness (generalized)  Hip stiffness, right     Problem List Patient Active Problem List   Diagnosis Date Noted  . History of deep venous thrombosis 05/05/2015  . Personal history of osteosarcoma 05/05/2015  . Anemia 11/04/2014  . Abnormal finding on mammography 05/18/2014  . Major depression, recurrent ([place]) 03/19/2014  . Vision disturbance 02/28/2014  . Postprocedural state 12/31/2013  . History of biliary T-tube placement 12/31/2013  .  Lupus anticoagulant disorder ([place]) 12/20/2013  . Back pain, chronic 11/07/2013  . Absence of bladder continence 08/05/2013  . History of anticoagulant therapy 07/17/2013  . Long term current use of anticoagulant therapy 07/17/2013  . Partial epilepsy with impairment of consciousness ([place]) 06/21/2013  . Intractable epilepsy ([place]) 02/28/2013  . Absolute anemia 02/28/2013  . Seizures ([place]) 02/21/2013  . Seizure ([place]) 02/17/2013  . DVT, bilateral lower limbs ([place]) 11/07/2012  . Hypothyroid 07/04/2012  . Seizure disorder ([place]) 07/04/2012  . Chronic pain syndrome 07/04/2012  . S/P BKA (below knee amputation) ([place]) 07/04/2012  . Chronic pain associated with significant psychosocial dysfunction 07/04/2012  . Cerebrovascular accident, late effects 06/05/2012  . Status post above knee amputation 06/08/2011  . Mechanical complication of graft of bone, cartilage, muscle or tendon 05/10/2011  . Depression 05/03/2011  . 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

## 2019-08-29 NOTE — Patient Instructions (Signed)
Instructions for wear time:    1:  Begin wearing leg 2hrs, 2 times per day.  2 hours in the morning and 2 hours in the afternoon.    2: When leg is off during the day, wear shrinker (can wear liner but check skin often).  Wear shrinker at night.    3:  Use foam sanitizer if able on both liner and the inside of the socket.  This will help getting leg on.   4:  Make appt with [employer] and have them make necessary adjustments. Ask about having another liner (should have 2).   5:  Can twist off valve to make that leg is all the way at the bottom.    6:  If over the 2 hours (don't have to be up the entire 2 hours) but over that time, if you feel more pain at the bottom of your leg or like the socket is pushing into your groin, you probably need to add a sock.    7: NO LOTION in the morning.  Can do at night but make sure leg is dry in the morning.

## 2019-09-05 ENCOUNTER — Ambulatory Visit (INDEPENDENT_AMBULATORY_CARE_PROVIDER_SITE_OTHER): Payer: Medicare Other | Admitting: Licensed Clinical Social Worker

## 2019-09-05 ENCOUNTER — Encounter (HOSPITAL_COMMUNITY): Payer: Self-pay | Admitting: Licensed Clinical Social Worker

## 2019-09-05 ENCOUNTER — Other Ambulatory Visit: Payer: Self-pay

## 2019-09-05 DIAGNOSIS — F330 Major depressive disorder, recurrent, mild: Secondary | ICD-10-CM | POA: Diagnosis not present

## 2019-09-05 NOTE — Progress Notes (Signed)
Virtual Visit via Telephone Note  I connected with Cassandra Andrade on 09/05/19 at  2:30 PM EDT by telephone and verified that I am speaking with the correct person using two identifiers.  Location: Patient: home Provider: office   I discussed the limitations, risks, security and privacy concerns of performing an evaluation and management service by telephone and the availability of in person appointments. I also discussed with the patient that there may be a patient responsible charge related to this service. The patient expressed understanding and agreed to proceed. Type of Therapy: Individual Therapy   Treatment Goals addressed: improve psychiatric symptoms, elevate mood, Improve unhelpful thought patterns, interpersonal relationship skills, learn about diagnosis, healthy coping skills   Interventions: Motivational Interviewing   Summary: Cassandra Andrade is a 54 y.o. female who presents with Major Depressive Disorder, recurrent, mild   Suicidal/Homicidal: No -without intent/plan   Therapist Response: Cassandra Andrade met with clinician for an individual session. Cassandra Andrade discussed her psychiatric symptoms, her current life events and her homework. Cassandra Andrade shared that she has been feeling pretty good overall. She identified some fleeting sadness, but nothing severe or too worrisome. Clinician processed thoughts and feelings about getting back into physical therapy using MI. Clinician reflected increased hope and happiness that she will be able to improve her walking. Clinician processed thoughts and feelings about mom becoming a foster parent. Cassandra Andrade processed her excitement about having a kid in the house, someone to spend time with and get to help.    Plan: Return again in 4 weeks.   Diagnosis: Axis I: Major Depressive Disorder, recurrent, mild        I discussed the assessment and treatment plan with the patient. The patient was provided an opportunity to ask questions and all were answered. The patient  agreed with the plan and demonstrated an understanding of the instructions.   The patient was advised to call back or seek an in-person evaluation if the symptoms worsen or if the condition fails to improve as anticipated.  I provided 45 minutes of non-face-to-face time during this encounter.   Cassandra Curling, LCSW

## 2019-09-10 ENCOUNTER — Ambulatory Visit: Payer: Medicare Other | Admitting: Physical Therapy

## 2019-09-10 ENCOUNTER — Encounter: Payer: Self-pay | Admitting: Physical Therapy

## 2019-09-10 ENCOUNTER — Other Ambulatory Visit: Payer: Self-pay

## 2019-09-10 DIAGNOSIS — R2689 Other abnormalities of gait and mobility: Secondary | ICD-10-CM

## 2019-09-10 DIAGNOSIS — M6281 Muscle weakness (generalized): Secondary | ICD-10-CM | POA: Diagnosis not present

## 2019-09-10 DIAGNOSIS — R2681 Unsteadiness on feet: Secondary | ICD-10-CM

## 2019-09-10 DIAGNOSIS — M25651 Stiffness of right hip, not elsewhere classified: Secondary | ICD-10-CM

## 2019-09-10 NOTE — Therapy (Signed)
[facility] [address] [place], [place], [address] Phone: [phone]   Fax:  [phone]  Physical Therapy Treatment  Patient Details  Name: Cassandra Andrade MRN: [id number] Date of Birth: 10/16/1965 Referring Provider (PT): Margette Fast, MD   Encounter Date: 09/10/2019   PT End of Session - 09/10/19 1847    Visit Number 2    Number of Visits 17    Date for PT Re-Evaluation 10/28/19    Authorization Type Medicare, Medicaid, Tricare    PT Start Time 4854    PT Stop Time 1833    PT Time Calculation (min) 46 min    Activity Tolerance Patient tolerated treatment well    Behavior During Therapy [facility] for tasks assessed/performed           Past Medical History:  Diagnosis Date  . Anxiety   . Arthritis   . Cancer (HCC)    Osteosarcoma, right leg  . CVA (cerebrovascular accident due to intracerebral hemorrhage) ([place]) 2004  . Depression   . DVT (deep venous thrombosis) (HCC)    Left leg  . MRSA (methicillin resistant Staphylococcus aureus)   . 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 Seizures ([place])   . Thyroid disease   . 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 ABOVE KNEE LEG AMPUTATION Right    Osteosarcoma  . BRAIN SURGERY  2004   cerebral aneurysm  . FRACTURE SURGERY     Rt. femur, folllowed by MRSA  . TOTAL KNEE ARTHROPLASTY Right     There were no vitals filed for this visit.                      [facility] Adult PT Treatment/Exercise - 09/10/19 0001      Transfers   Transfers Sit to Stand;Stand to Sit    Sit to Stand 5: Supervision    Sit to Stand Details --    Stand to Sit 5: Supervision;4: Min guard    Stand to Sit Details (indicate cue type and reason) Verbal cues for sequencing;Verbal cues for technique;Verbal cues for precautions/safety      Ambulation/Gait   Ambulation/Gait Yes     Ambulation/Gait Assistance 4: Min guard    Ambulation Distance (Feet) 80 Feet    Assistive device Rolling walker;Prosthesis    Gait Pattern Step-through pattern;Decreased stance time - right;Decreased step length - left;Decreased stride length;Decreased weight shift to right;Right circumduction;Lateral hip instability;Narrow base of support    Ambulation Surface Level;Indoor      Prosthetics   Prosthetic Care Comments  recommend increase wearing  prosthesis 3hrs 2x/day. Recommend trying deodorant on resiudal limb to reduce sweating.     Current prosthetic wear tolerance (days/week)  2hr 2x/day.    Current prosthetic wear tolerance (#hours/day)  2hrs 2x/day    Current prosthetic weight-bearing tolerance (hours/day)  Able to tolerate 90' (approx 20 mins of WB)     Residual limb condition  no issues reported    Education Provided Residual limb care;Correct ply sock adjustment;Proper wear schedule/adjustment;Proper weight-bearing schedule/adjustment    Person(s) Educated Spouse;Parent(s)    Education Method Explanation;Verbal cues;Handout               Balance Exercises - 09/10/19 0001      Balance Exercises: Standing   Standing Eyes Opened Wide (BOA);5 reps   standing at the sink working on  midline awareness using visual, manual, and verbal cues. Worked on weight shifts- lateral, A/P, trunk rotation.  Multi level reaching, close supervision to min guard for safety.            PT Education - 09/10/19 1857    Education Details standing balance at the sink.    Person(s) Educated Patient;Caregiver(s)    Methods Explanation;Demonstration;Tactile cues;Verbal cues;Handout    Comprehension Verbalized understanding;Returned demonstration;Verbal cues required;Tactile cues required;Need further instruction            PT Short Term Goals - 08/29/19 1511      PT SHORT TERM GOAL #1   Title Pt will be don/doff prosthesis at S level and tolerate wearing prosthesis 5 hours, 2 times per day  without skin issues or discomfort.  (Target Date: 09/28/19)    Baseline Donning (not properly) and not wearing consistently daily    Time 4    Period Weeks    Status New    Target Date 09/28/19      PT SHORT TERM GOAL #2   Title Pt will be independent with sink exercises/other HEP (with S from mother as needed) in order to indicate improved functional mobility.    Time 4    Period Weeks    Status New      PT SHORT TERM GOAL #3   Title Pt will improve gait speed to >/=1.82 ft/sec with RW/prosthesis in order to indicate decreased fall risk.    Time 4    Period Weeks    Status New      PT SHORT TERM GOAL #4   Title Pt will ambulate x 37' with RW in simulated household environment at S level in order to indicate safety in home.    Time 4    Period Weeks    Status New      PT SHORT TERM GOAL #5   Title Pt will improve BERG balance test to >/=22/56 in order to indicate decreased fall risk.    Time 4    Period Weeks    Status New             PT Long Term Goals - 08/29/19 1528      PT LONG TERM GOAL #1   Title Pt will be able to don/doff prosthesis at S level and tolerate wear of prosthesis >75% of awake hours without change in skin integrity or tenderness.  (Target Date: 10/28/19)    Time 8    Period Weeks    Status New    Target Date 10/28/19      PT LONG TERM GOAL #2   Title Pt will improve gait speed to >/=2.42 ft/sec with LRAD and prosthesis in order to indicate safer community ambulation.    Time 8    Period Weeks    Status New      PT LONG TERM GOAL #3   Title Pt will ambulate x 300' with LRAD at S level over indoor and unlevel paved outdoor surfaces in order to indicate safe home and limited community ambulation.    Time 8    Period Weeks    Status New      PT LONG TERM GOAL #4   Title Pt will improve BERG score to >/=26/56 in order to indicate decreased fall risk.    Time 8    Period Weeks    Status New      PT LONG TERM GOAL #5   Title Standing balance  with rolling walker and prosthesis reaching 10" anteriorly, across midline and to floor with S.    Time 8    Period Weeks    Status New                 Plan - 09/10/19 1848    Clinical Impression Statement Reveiwed prosthetic and residual limb care.  Increased wear time reommendation to 3hrs 2x/day; pt reports no skin issues. Initiated balance exercise HEP with standing; pt's mother present to aid with carryover at home and safety.  Pt required max cues for midline standing postition and min guard for safety with balance exercises.    Personal Factors and Comorbidities Comorbidity 3+;Time since onset of injury/illness/exacerbation    Comorbidities see above    Examination-Activity Limitations Bathing;Bend;Dressing;Hygiene/Grooming;Locomotion Level;Reach Overhead;Stairs;Stand;Transfers    Examination-Participation Restrictions Community Activity;Laundry;Shop;Meal Prep    Stability/Clinical Decision Making Evolving/Moderate complexity    Rehab Potential Good    PT Frequency 2x / week    PT Duration 8 weeks    PT Treatment/Interventions ADLs/Self Care Home Management;DME Instruction;Gait training;Stair training;Functional mobility training;Therapeutic activities;Therapeutic exercise;Balance training;Neuromuscular re-education;Cognitive remediation;Patient/family education;Prosthetic Training;Passive range of motion;Energy conservation;Vestibular    PT Next Visit Plan Check skin, increase wear time. Is she better able to don prosthesis (work on decreased hip retraction during donning), educate on sock ply adjustment, review sink exercises and go over with pt/mother.    Consulted and Agree with Plan of Care Patient;Family member/caregiver    Family Member Consulted mother           Patient will benefit from skilled therapeutic intervention in order to improve the following deficits and impairments:  Abnormal gait, Decreased activity tolerance, Decreased balance, Decreased cognition,  Decreased endurance, Decreased knowledge of precautions, Decreased knowledge of use of DME, Decreased mobility, Decreased range of motion, Decreased safety awareness, Decreased strength, Difficulty walking, Increased edema, Impaired perceived functional ability, Impaired flexibility, Postural dysfunction  Visit Diagnosis: Unsteadiness on feet  Other abnormalities of gait and mobility  Muscle weakness (generalized)  Hip stiffness, right     Problem List Patient Active Problem List   Diagnosis Date Noted  . History of deep venous thrombosis 05/05/2015  . Personal history of osteosarcoma 05/05/2015  . Anemia 11/04/2014  . Abnormal finding on mammography 05/18/2014  . Major depression, recurrent ([place]) 03/19/2014  . Vision disturbance 02/28/2014  . Postprocedural state 12/31/2013  . History of biliary T-tube placement 12/31/2013  . Lupus anticoagulant disorder ([place]) 12/20/2013  . Back pain, chronic 11/07/2013  . Absence of bladder continence 08/05/2013  . History of anticoagulant therapy 07/17/2013  . Long term current use of anticoagulant therapy 07/17/2013  . Partial epilepsy with impairment of consciousness ([place]) 06/21/2013  . Intractable epilepsy ([place]) 02/28/2013  . Absolute anemia 02/28/2013  . Seizures ([place]) 02/21/2013  . Seizure ([place]) 02/17/2013  . DVT, bilateral lower limbs ([place]) 11/07/2012  . Hypothyroid 07/04/2012  . Seizure disorder ([place]) 07/04/2012  . Chronic pain syndrome 07/04/2012  . S/P BKA (below knee amputation) ([place]) 07/04/2012  . Chronic pain associated with significant psychosocial dysfunction 07/04/2012  . Cerebrovascular accident, late effects 06/05/2012  . Status post above knee amputation 06/08/2011  . Mechanical complication of graft of bone, cartilage, muscle or tendon 05/10/2011  . Depression 05/03/2011  . 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

## 2019-09-10 NOTE — Patient Instructions (Addendum)
Do each exercise 1-2  times per day Do each exercise 10 repetitions Hold each exercise for 5 seconds to feel your location  Put timer on for 10 min  AT [place].  USE TAPE ON FLOOR TO MARK THE MIDLINE POSITION. You also should try to feel with your limb pressure in socket.  You are trying to feel with limb what you used to feel with the bottom of your foot.  1. Side to Side Shift: Moving your hips only (not shoulders): move weight onto your left leg, HOLD/FEEL.  Move back to equal weight on each leg, HOLD/FEEL. Move weight onto your right leg, HOLD/FEEL. Move back to equal weight on each leg, HOLD/FEEL. Repeat. 2. Front to Back Shift: Moving your hips only (not shoulders): move your weight forward onto your toes, HOLD/FEEL. Move your weight back to equal Flat Foot on both legs, HOLD/FEEL. Move your weight back onto your heels, HOLD/FEEL. Move your weight back to equal on both legs, HOLD/FEEL. Repeat. 3. Moving Cones / Cups: With equal weight on each leg: Hold on with one hand the first time, then progress to no hand supports. Move cups from one side of sink to the other. Place cups ~2" out of your reach, progress to 10" beyond reach. 4. Overhead/Upward Reaching: alternated reaching up to top cabinets or ceiling if no cabinets present. Keep equal weight on each leg. Start with one hand support on counter while other hand reaches and progress to no hand support with reaching. 5.   Looking Over Shoulders: With equal weight on each leg: alternate turning to look          over your shoulders with one hand support on counter as needed. Shift weight to  side looking, pull hip then shoulder then head/eyes around to look behind you. Start       with one hand support & progress to no hand support.

## 2019-09-17 ENCOUNTER — Ambulatory Visit: Payer: Medicare Other | Admitting: Physical Therapy

## 2019-09-20 ENCOUNTER — Other Ambulatory Visit (HOSPITAL_COMMUNITY): Payer: Self-pay | Admitting: Psychiatry

## 2019-09-20 DIAGNOSIS — F419 Anxiety disorder, unspecified: Secondary | ICD-10-CM

## 2019-09-20 DIAGNOSIS — F321 Major depressive disorder, single episode, moderate: Secondary | ICD-10-CM

## 2019-09-24 ENCOUNTER — Ambulatory Visit: Payer: Medicare Other | Admitting: Physical Therapy

## 2019-09-26 ENCOUNTER — Ambulatory Visit: Payer: Medicare Other | Admitting: Physical Therapy

## 2019-10-01 ENCOUNTER — Ambulatory Visit: Payer: Medicare Other | Attending: Nurse Practitioner | Admitting: Physical Therapy

## 2019-10-01 ENCOUNTER — Other Ambulatory Visit: Payer: Self-pay

## 2019-10-01 DIAGNOSIS — M25651 Stiffness of right hip, not elsewhere classified: Secondary | ICD-10-CM | POA: Diagnosis not present

## 2019-10-01 DIAGNOSIS — M6281 Muscle weakness (generalized): Secondary | ICD-10-CM | POA: Insufficient documentation

## 2019-10-01 DIAGNOSIS — R2689 Other abnormalities of gait and mobility: Secondary | ICD-10-CM | POA: Insufficient documentation

## 2019-10-01 DIAGNOSIS — R2681 Unsteadiness on feet: Secondary | ICD-10-CM | POA: Diagnosis not present

## 2019-10-01 NOTE — Therapy (Signed)
[facility] [address] [place], [place], [address] Phone: [phone]   Fax:  [phone]  Physical Therapy Treatment  Patient Details  Name: Cassandra Andrade MRN: [id number] Date of Birth: Jul 20, 1965 Referring Provider (PT): Margette Fast, MD   Encounter Date: 10/01/2019   PT End of Session - 10/01/19 1841    Visit Number 3    Number of Visits 17    Date for PT Re-Evaluation 10/28/19    Authorization Type Medicare, Medicaid, Tricare    PT Start Time 1752    PT Stop Time 1837    PT Time Calculation (min) 45 min    Activity Tolerance Patient tolerated treatment well    Behavior During Therapy [facility] for tasks assessed/performed           Past Medical History:  Diagnosis Date   Anxiety    Arthritis    Cancer ([place])    Osteosarcoma, right leg   CVA (cerebrovascular accident due to intracerebral hemorrhage) ([place]) 2004   Depression    DVT (deep venous thrombosis) (HCC)    Left leg   MRSA (methicillin resistant Staphylococcus aureus)    S/P BKA (below knee amputation) unilateral ([place])    Right    Seizures ([place])    Thyroid disease    Unspecified epilepsy with intractable epilepsy 02/28/2013    Past Surgical History:  Procedure Laterality Date   ABOVE KNEE LEG AMPUTATION Right    Osteosarcoma   BRAIN SURGERY  2004   cerebral aneurysm   FRACTURE SURGERY     Rt. femur, folllowed by MRSA   TOTAL KNEE ARTHROPLASTY Right     There were no vitals filed for this visit.   Subjective Assessment - 10/01/19 1853    Subjective Pt's mother reports pt had a fall in the bathroom; no injury except bruised knee and pt reports getting back up herself with external support.    Patient is accompained by: Family member   mother Cassandra Andrade   Limitations Walking;House hold activities;Standing    How long can you stand comfortably? approx 30 mins per pt report    How long can you walk comfortably? 15-20 mins per pt  report    Patient Stated Goals To walk with a cane if possible and get my balance better.    Currently in Pain? No/denies                             [facility] Adult PT Treatment/Exercise - 10/01/19 0001      Transfers   Transfers Sit to Stand;Stand to Sit    Sit to Stand 5: Supervision    Stand to Sit 5: Supervision      Ambulation/Gait   Ambulation/Gait Yes    Ambulation/Gait Assistance 5: Supervision    Ambulation/Gait Assistance Details cues for controlled steplength with prosthesis    Ambulation Distance (Feet) 150 Feet    Assistive device Rolling walker;Prosthesis    Gait Pattern Step-through pattern;Decreased stance time - right;Decreased step length - left;Decreased stride length;Decreased weight shift to right;Right circumduction;Lateral hip instability;Narrow base of support    Ambulation Surface Level;Indoor      Prosthetics   Prosthetic Care Comments  recommend increase wearing  prosthesis 5hrs 2x/day. Recommend trying deodorant on resiudal limb to reduce sweating.     Current prosthetic wear tolerance (days/week)  3hr 2x/day.    Current prosthetic wear tolerance (#hours/day)  3hrs 2x/day    Residual limb  condition  no skin issues    Education Provided Skin check;Residual limb care;Proper Donning;Proper wear schedule/adjustment;Proper weight-bearing schedule/adjustment    Person(s) Educated Patient;Parent(s)    Education Method Explanation;Demonstration;Tactile cues;Verbal cues;Handout    Education Method Verbalized understanding;Returned demonstration;Verbal cues required;Needs further instruction    Donning Prosthesis Minimal assist                    PT Short Term Goals - 10/01/19 1842      PT SHORT TERM GOAL #1   Title Pt will be don/doff prosthesis at S level and tolerate wearing prosthesis 5 hours, 2 times per day without skin issues or discomfort.  (Target Date: 09/28/19)    Baseline [place] supervision level, donning S-MinA ( at times  still needs A). Tolerate wearing prosthesis 3 hours, 2 times per day without skin issues or discomfort    Time 4    Period Weeks    Status Not Met    Target Date 09/28/19      PT SHORT TERM GOAL #2   Title Pt will be independent with sink exercises/other HEP (with S from mother as needed) in order to indicate improved functional mobility.    Baseline Met 10/01/19    Time 4    Period Weeks    Status Achieved      PT SHORT TERM GOAL #3   Title Pt will improve gait speed to >/=1.82 ft/sec with RW/prosthesis in order to indicate decreased fall risk.    Baseline 1.69f/sec with RW/prosthesis.    Time 4    Period Weeks    Status Not Met      PT SHORT TERM GOAL #4   Title Pt will ambulate x 571 with RW in simulated household environment at S level in order to indicate safety in home.    Baseline Met with cues for safety 10/01/19.    Time 4    Period Weeks    Status Achieved      PT SHORT TERM GOAL #5   Title Pt will improve BERG balance test to >/=22/56 in order to indicate decreased fall risk.    Baseline Not tested on 10/01/19 due to time constraints.    Time 4    Period Weeks    Status On-going             PT Long Term Goals - 08/29/19 1528      PT LONG TERM GOAL #1   Title Pt will be able to don/doff prosthesis at S level and tolerate wear of prosthesis >75% of awake hours without change in skin integrity or tenderness.  (Target Date: 10/28/19)    Time 8    Period Weeks    Status New    Target Date 10/28/19      PT LONG TERM GOAL #2   Title Pt will improve gait speed to >/=2.42 ft/sec with LRAD and prosthesis in order to indicate safer community ambulation.    Time 8    Period Weeks    Status New      PT LONG TERM GOAL #3   Title Pt will ambulate x 300' with LRAD at S level over indoor and unlevel paved outdoor surfaces in order to indicate safe home and limited community ambulation.    Time 8    Period Weeks    Status New      PT LONG TERM GOAL #4   Title Pt will  improve BERG score to >/=26/56 in order  to indicate decreased fall risk.    Time 8    Period Weeks    Status New      PT LONG TERM GOAL #5   Title Standing balance with rolling walker and prosthesis reaching 10" anteriorly, across midline and to floor with S.    Time 8    Period Weeks    Status New                 Plan - 10/01/19 1847    Clinical Impression Statement Skilled session focused on checking STGs.  Pt has made some progress with donning/doffing prosthesis, functional gait with RW/prosthesis, HEP performance for functional standing and residual limb care. But progress has been limited; this is only pt's 3rd visit due to cancellations for various reasons including limited transportation.  Discussed the importance of attendence with pt and caregiver and they verbalized understanding.    Personal Factors and Comorbidities Comorbidity 3+;Time since onset of injury/illness/exacerbation    Comorbidities see above    Examination-Activity Limitations Bathing;Bend;Dressing;Hygiene/Grooming;Locomotion Level;Reach Overhead;Stairs;Stand;Transfers    Examination-Participation Restrictions Community Activity;Laundry;Shop;Meal Prep    Stability/Clinical Decision Making Evolving/Moderate complexity    Rehab Potential Good    PT Frequency 2x / week    PT Duration 8 weeks    PT Treatment/Interventions ADLs/Self Care Home Management;DME Instruction;Gait training;Stair training;Functional mobility training;Therapeutic activities;Therapeutic exercise;Balance training;Neuromuscular re-education;Cognitive remediation;Patient/family education;Prosthetic Training;Passive range of motion;Energy conservation;Vestibular    PT Next Visit Plan Check Berg test for STG #5. Is she better able to don prosthesis (work on decreased hip retraction during donning), educate on sock ply adjustment, review sink exercises and go over with pt/mother.    Consulted and Agree with Plan of Care Patient;Family  member/caregiver    Family Member Consulted mother           Patient will benefit from skilled therapeutic intervention in order to improve the following deficits and impairments:  Abnormal gait, Decreased activity tolerance, Decreased balance, Decreased cognition, Decreased endurance, Decreased knowledge of precautions, Decreased knowledge of use of DME, Decreased mobility, Decreased range of motion, Decreased safety awareness, Decreased strength, Difficulty walking, Increased edema, Impaired perceived functional ability, Impaired flexibility, Postural dysfunction  Visit Diagnosis: Unsteadiness on feet  Other abnormalities of gait and mobility  Muscle weakness (generalized)  Hip stiffness, right     Problem List Patient Active Problem List   Diagnosis Date Noted   History of deep venous thrombosis 05/05/2015   Personal history of osteosarcoma 05/05/2015   Anemia 11/04/2014   Abnormal finding on mammography 05/18/2014   Major depression, recurrent (HCC) 03/19/2014   Vision disturbance 02/28/2014   Postprocedural state 12/31/2013   History of biliary T-tube placement 12/31/2013   Lupus anticoagulant disorder (HCC) 12/20/2013   Back pain, chronic 11/07/2013   Absence of bladder continence 08/05/2013   History of anticoagulant therapy 07/17/2013   Long term current use of anticoagulant therapy 07/17/2013   Partial epilepsy with impairment of consciousness (HCC) 06/21/2013   Intractable epilepsy (HCC) 02/28/2013   Absolute anemia 02/28/2013   Seizures (HCC) 02/21/2013   Seizure (HCC) 02/17/2013   DVT, bilateral lower limbs (HCC) 11/07/2012   Hypothyroid 07/04/2012   Seizure disorder (HCC) 07/04/2012   Chronic pain syndrome 07/04/2012   S/P BKA (below knee amputation) (HCC) 07/04/2012   Chronic pain associated with significant psychosocial dysfunction 07/04/2012   Cerebrovascular accident, late effects 06/05/2012   Status post above knee  amputation 06/08/2011   Mechanical complication of graft of bone, cartilage, muscle or tendon 05/10/2011  Depression 05/03/2011   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 [place], [place], [address] Phone: [phone]   Fax:  [phone]  Name: Cassandra Andrade MRN: [id number] Date of Birth: [birth year]

## 2019-10-01 NOTE — Patient Instructions (Signed)
May use Antiperspirtant to control sweating  Make an appointment with [facility] for any adjustments.  Try to do standing exercises every day for 5-10 min  Wear prosthesis 5 hrs 2x/day

## 2019-10-03 ENCOUNTER — Other Ambulatory Visit: Payer: Self-pay

## 2019-10-03 ENCOUNTER — Encounter: Payer: Self-pay | Admitting: Physical Therapy

## 2019-10-03 ENCOUNTER — Ambulatory Visit: Payer: Medicare Other | Admitting: Physical Therapy

## 2019-10-03 DIAGNOSIS — R2689 Other abnormalities of gait and mobility: Secondary | ICD-10-CM | POA: Diagnosis not present

## 2019-10-03 DIAGNOSIS — Z1231 Encounter for screening mammogram for malignant neoplasm of breast: Secondary | ICD-10-CM | POA: Diagnosis not present

## 2019-10-03 DIAGNOSIS — R2681 Unsteadiness on feet: Secondary | ICD-10-CM

## 2019-10-03 DIAGNOSIS — Z86718 Personal history of other venous thrombosis and embolism: Secondary | ICD-10-CM | POA: Diagnosis not present

## 2019-10-03 DIAGNOSIS — M6281 Muscle weakness (generalized): Secondary | ICD-10-CM | POA: Diagnosis not present

## 2019-10-03 DIAGNOSIS — I6990 Unspecified sequelae of unspecified cerebrovascular disease: Secondary | ICD-10-CM | POA: Diagnosis not present

## 2019-10-03 DIAGNOSIS — M25651 Stiffness of right hip, not elsewhere classified: Secondary | ICD-10-CM | POA: Diagnosis not present

## 2019-10-04 ENCOUNTER — Other Ambulatory Visit: Payer: Self-pay | Admitting: Neurology

## 2019-10-05 NOTE — Therapy (Signed)
[facility] [address] [place], [place], [address] Phone: [phone]   Fax:  [phone]  Physical Therapy Treatment  Patient Details  Name: Cassandra Andrade MRN: [id number] Date of Birth: 11-Jul-1965 Referring Provider (PT): Margette Fast, MD   Encounter Date: 10/03/2019    10/03/19 1548  PT Visits / Re-Eval  Visit Number 4  Number of Visits 17  Date for PT Re-Evaluation 10/28/19  Authorization  Authorization Type Medicare, Medicaid, Tricare  PT Time Calculation  PT Start Time 1532  PT Stop Time 1612  PT Time Calculation (min) 40 min  PT - End of Session  Equipment Utilized During Treatment Gait belt  Activity Tolerance Patient tolerated treatment well  Behavior During Therapy [facility] for tasks assessed/performed     Past Medical History:  Diagnosis Date  . Anxiety   . Arthritis   . Cancer (HCC)    Osteosarcoma, right leg  . CVA (cerebrovascular accident due to intracerebral hemorrhage) ([place]) 2004  . Depression   . DVT (deep venous thrombosis) (HCC)    Left leg  . MRSA (methicillin resistant Staphylococcus aureus)   . 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 Seizures ([place])   . Thyroid disease   . 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 ABOVE KNEE LEG AMPUTATION Right    Osteosarcoma  . BRAIN SURGERY  2004   cerebral aneurysm  . FRACTURE SURGERY     Rt. femur, folllowed by MRSA  . TOTAL KNEE ARTHROPLASTY Right     There were no vitals filed for this visit.     10/03/19 1546  Symptoms/Limitations  Subjective No new falls or pain to report. Does report the liner "pulling" on skin on inner thigh.  Patient is accompained by: Family member (mom, [name], in lobby)  Limitations Walking;House hold activities;Standing  How long can you stand comfortably? approx 30 mins per pt report  How long can you walk  comfortably? 15-20 mins per pt report  Patient Stated Goals To walk with a cane if possible and get my balance better.  Pain Assessment  Currently in Pain? No/denies      10/03/19 1550  Berg Balance Test  Sit to Stand 4  Standing Unsupported 3  Sitting with Back Unsupported but Feet Supported on Floor or Stool 4  Stand to Sit 3  Transfers 2  Standing Unsupported with Eyes Closed 2  Standing Unsupported with Feet Together 3  From Standing, Reach Forward with Outstretched Arm 3 (5 inches)  From Standing Position, Pick up Object from Floor 0 (needed mod cues on correct technique with AKA prosthesis)  From Standing Position, Turn to Look Behind Over each Shoulder 2  Turn 360 Degrees 0  Standing Unsupported, Alternately Place Feet on Step/Stool 0  Standing Unsupported, One Foot in Front 2  Standing on One Leg 0  Total Score 28  Berg comment: < 36 high risk for falls (close to 100%)      10/03/19 1550  Transfers  Transfers Sit to Stand;Stand to Sit  Sit to Stand 5: Supervision  Stand to Sit 5: Supervision  Ambulation/Gait  Ambulation/Gait Yes  Ambulation/Gait Assistance 5: Supervision  Ambulation/Gait Assistance Details into/out of gym with walker/prosthesis. cues needed on posture, step placement, weight shifting and for walker position with gait.   Assistive device Rolling walker;Prosthesis  Gait Pattern Step-through pattern;Decreased stance time - right;Decreased step length -  left;Decreased stride length;Decreased weight shift to right;Right circumduction;Lateral hip instability;Narrow base of support  Ambulation Surface Level;Indoor  Prosthetics  Prosthetic Care Comments  pt reported wearing 5 hours 2 x day,mom not in session to confirm.  Current prosthetic wear tolerance (days/week)  3hr 2x/day.  Residual limb condition  no skin issues  Education Provided Residual limb care;Proper wear schedule/adjustment;Proper weight-bearing schedule/adjustment  Person(s) Educated  Patient  Education Method Explanation;Demonstration;Verbal cues  Education Method Verbalized understanding;Returned demonstration;Verbal cues required;Needs further instruction  Donning Prosthesis 4  Doffing Prosthesis 5        PT Short Term Goals - 10/05/19 0830      PT SHORT TERM GOAL #1   Title Pt will be don/doff prosthesis at S level and tolerate wearing prosthesis 5 hours, 2 times per day without skin issues or discomfort.  (Target Date: 09/28/19)    Baseline 10/01/19: Doffing supervision level, donning S-MinA ( at times still needs A). Tolerate wearing prosthesis 3 hours, 2 times per day without skin issues or discomfort    Time --    Period --    Status Not Met    Target Date 09/28/19      PT SHORT TERM GOAL #2   Title Pt will be independent with sink exercises/other HEP (with S from mother as needed) in order to indicate improved functional mobility.    Baseline Met 10/01/19    Time --    Period --    Status Achieved      PT SHORT TERM GOAL #3   Title Pt will improve gait speed to >/=1.82 ft/sec with RW/prosthesis in order to indicate decreased fall risk.    Baseline 10/01/19: 1.5f/sec with RW/prosthesis.    Time --    Period --    Status Not Met      PT SHORT TERM GOAL #4   Title Pt will ambulate x 553 with RW in simulated household environment at S level in order to indicate safety in home.    Baseline Met with cues for safety 10/01/19.    Time --    Period --    Status Achieved      PT SHORT TERM GOAL #5   Title Pt will improve BERG balance test to >/=22/56 in order to indicate decreased fall risk.    Baseline 10/03/19: 28/56    Time --    Period --    Status Achieved               PT Long Term Goals - 08/29/19 1528      PT LONG TERM GOAL #1   Title Pt will be able to don/doff prosthesis at S level and tolerate wear of prosthesis >75% of awake hours without change in skin integrity or tenderness.  (Target Date: 10/28/19)    Time 8    Period Weeks    Status  New    Target Date 10/28/19      PT LONG TERM GOAL #2   Title Pt will improve gait speed to >/=2.42 ft/sec with LRAD and prosthesis in order to indicate safer community ambulation.    Time 8    Period Weeks    Status New      PT LONG TERM GOAL #3   Title Pt will ambulate x 300' with LRAD at S level over indoor and unlevel paved outdoor surfaces in order to indicate safe home and limited community ambulation.    Time 8    Period Weeks  Status New      PT LONG TERM GOAL #4   Title Pt will improve BERG score to >/=26/56 in order to indicate decreased fall risk.    Time 8    Period Weeks    Status New      PT LONG TERM GOAL #5   Title Standing balance with rolling walker and prosthesis reaching 10" anteriorly, across midline and to floor with S.    Time 8    Period Weeks    Status New              10/03/19 1548  Plan  Clinical Impression Statement Today's skilled session initially focused on Berg Balance test to check progress toward remaining STG. Pt scored 28/56 today, meeting the remaining goal. Remainder of session worked on gait. Skin checked with prosthesis redonned due to rotation on entering gym before any activities were done. The pt is progressing toward goals and should benefit from continued PT to progress toward unmet goals.  Personal Factors and Comorbidities Comorbidity 3+;Time since onset of injury/illness/exacerbation  Comorbidities see above  Examination-Activity Limitations Bathing;Bend;Dressing;Hygiene/Grooming;Locomotion Level;Reach Overhead;Stairs;Stand;Transfers  Examination-Participation Restrictions Community Activity;Laundry;Shop;Meal Prep  Pt will benefit from skilled therapeutic intervention in order to improve on the following deficits Abnormal gait;Decreased activity tolerance;Decreased balance;Decreased cognition;Decreased endurance;Decreased knowledge of precautions;Decreased knowledge of use of DME;Decreased mobility;Decreased range of  motion;Decreased safety awareness;Decreased strength;Difficulty walking;Increased edema;Impaired perceived functional ability;Impaired flexibility;Postural dysfunction  Stability/Clinical Decision Making Evolving/Moderate complexity  Rehab Potential Good  PT Frequency 2x / week  PT Duration 8 weeks  PT Treatment/Interventions ADLs/Self Care Home Management;DME Instruction;Gait training;Stair training;Functional mobility training;Therapeutic activities;Therapeutic exercise;Balance training;Neuromuscular re-education;Cognitive remediation;Patient/family education;Prosthetic Training;Passive range of motion;Energy conservation;Vestibular  PT Next Visit Plan Is she better able to don prosthesis (work on decreased hip retraction during donning), educate on sock ply adjustment, review sink exercises and go over with pt/mother.  Consulted and Agree with Plan of Care Patient;Family member/caregiver  Family Member Consulted mother         Patient will benefit from skilled therapeutic intervention in order to improve the following deficits and impairments:  Abnormal gait, Decreased activity tolerance, Decreased balance, Decreased cognition, Decreased endurance, Decreased knowledge of precautions, Decreased knowledge of use of DME, Decreased mobility, Decreased range of motion, Decreased safety awareness, Decreased strength, Difficulty walking, Increased edema, Impaired perceived functional ability, Impaired flexibility, Postural dysfunction  Visit Diagnosis: Unsteadiness on feet  Other abnormalities of gait and mobility  Muscle weakness (generalized)     Problem List Patient Active Problem List   Diagnosis Date Noted  . History of deep venous thrombosis 05/05/2015  . Personal history of osteosarcoma 05/05/2015  . Anemia 11/04/2014  . Abnormal finding on mammography 05/18/2014  . Major depression, recurrent ([place]) 03/19/2014  . Vision disturbance 02/28/2014  . Postprocedural state 12/31/2013   . History of biliary T-tube placement 12/31/2013  . Lupus anticoagulant disorder ([place]) 12/20/2013  . Back pain, chronic 11/07/2013  . Absence of bladder continence 08/05/2013  . History of anticoagulant therapy 07/17/2013  . Long term current use of anticoagulant therapy 07/17/2013  . Partial epilepsy with impairment of consciousness ([place]) 06/21/2013  . Intractable epilepsy ([place]) 02/28/2013  . Absolute anemia 02/28/2013  . Seizures ([place]) 02/21/2013  . Seizure ([place]) 02/17/2013  . DVT, bilateral lower limbs ([place]) 11/07/2012  . Hypothyroid 07/04/2012  . Seizure disorder ([place]) 07/04/2012  . Chronic pain syndrome 07/04/2012  . S/P BKA (below knee amputation) ([place]) 07/04/2012  . Chronic pain associated with significant psychosocial dysfunction 07/04/2012  .  Cerebrovascular accident, late effects 06/05/2012  . Status post above knee amputation 06/08/2011  . Mechanical complication of graft of bone, cartilage, muscle or tendon 05/10/2011  . Depression 05/03/2011  . 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

## 2019-10-06 DIAGNOSIS — Z1231 Encounter for screening mammogram for malignant neoplasm of breast: Secondary | ICD-10-CM | POA: Diagnosis not present

## 2019-10-08 ENCOUNTER — Ambulatory Visit: Payer: Medicare Other | Admitting: Physical Therapy

## 2019-10-08 ENCOUNTER — Encounter: Payer: Self-pay | Admitting: Physical Therapy

## 2019-10-08 ENCOUNTER — Other Ambulatory Visit: Payer: Self-pay

## 2019-10-08 DIAGNOSIS — R2681 Unsteadiness on feet: Secondary | ICD-10-CM

## 2019-10-08 DIAGNOSIS — M25651 Stiffness of right hip, not elsewhere classified: Secondary | ICD-10-CM | POA: Diagnosis not present

## 2019-10-08 DIAGNOSIS — M6281 Muscle weakness (generalized): Secondary | ICD-10-CM

## 2019-10-08 DIAGNOSIS — R2689 Other abnormalities of gait and mobility: Secondary | ICD-10-CM | POA: Diagnosis not present

## 2019-10-08 NOTE — Therapy (Signed)
[facility] [address] [place], [place], [address] Phone: [phone]   Fax:  [phone]  Physical Therapy Treatment  Patient Details  Name: Cassandra Andrade MRN: [id number] Date of Birth: 1966-02-16 Referring Provider (PT): Margette Fast, MD   Encounter Date: 10/08/2019   PT End of Session - 10/08/19 1851    Number of Visits 17    Date for PT Re-Evaluation 10/28/19    Authorization Type Medicare, Medicaid, Tricare    PT Start Time 1750    PT Stop Time 1835    PT Time Calculation (min) 45 min    Equipment Utilized During Treatment Gait belt    Activity Tolerance Patient tolerated treatment well    Behavior During Therapy [facility] for tasks assessed/performed           Past Medical History:  Diagnosis Date  . Anxiety   . Arthritis   . Cancer (HCC)    Osteosarcoma, right leg  . CVA (cerebrovascular accident due to intracerebral hemorrhage) ([place]) 2004  . Depression   . DVT (deep venous thrombosis) (HCC)    Left leg  . MRSA (methicillin resistant Staphylococcus aureus)   . 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 Seizures ([place])   . Thyroid disease   . 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 ABOVE KNEE LEG AMPUTATION Right    Osteosarcoma  . BRAIN SURGERY  2004   cerebral aneurysm  . FRACTURE SURGERY     Rt. femur, folllowed by MRSA  . TOTAL KNEE ARTHROPLASTY Right     There were no vitals filed for this visit.   Subjective Assessment - 10/08/19 1756    Subjective No falls since last visit.    Patient is accompained by: Family member   mom, [name], in lobby   Limitations Walking;House hold activities;Standing    How long can you stand comfortably? approx 30 mins per pt report    How long can you walk comfortably? 15-20 mins per pt report    Patient Stated Goals To walk with a cane if possible and get my  balance better.    Currently in Pain? No/denies                             [facility] Adult PT Treatment/Exercise - 10/08/19 0001      Transfers   Transfers Sit to Stand;Stand to Sit    Sit to Stand 5: Supervision    Stand to Sit 5: Supervision      Ambulation/Gait   Ambulation/Gait Yes    Ambulation/Gait Assistance 4: Min guard    Ambulation/Gait Assistance Details training for hip control and right foot placement using multi colored tile for visual feedback     Assistive device Rolling walker;Prosthesis    Gait Pattern Step-through pattern;Decreased stance time - right;Decreased step length - left;Decreased stride length;Decreased weight shift to right;Right circumduction;Lateral hip instability;Narrow base of support    Ambulation Surface Level;Indoor      Exercises   Exercises Knee/Hip      Knee/Hip Exercises: Sidelying   Clams right hip abduction 2x10      Prosthetics   Prosthetic Care Comments  pt reported wearing 5 hours 2 x day, recommend increasing wear time to 6hrs 2x/day  Current prosthetic wear tolerance (days/week)  5 hrs 2x/day    Residual limb condition  pt reports no skin issues    Education Provided Ply sock cleaning    Person(s) Educated Patient    Education Method Explanation;Demonstration    Education Method Verbalized understanding               Balance Exercises - 10/08/19 0001      Balance Exercises: Standing   Other Standing Exercises training for weight shifting to the right and midline awarenss using mirror for biofeedback, multimodal cues.                                     standing with walker.            PT Education - 10/08/19 1820    Education Details Updated HEP to include hip abd strengthening.    Person(s) Educated Patient    Methods Explanation;Demonstration;Tactile cues;Verbal cues;Handout    Comprehension Verbalized understanding;Returned demonstration;Verbal cues required              PT Short Term Goals - 10/05/19 0830      PT SHORT TERM GOAL #1   Title Pt will be don/doff prosthesis at S level and tolerate wearing prosthesis 5 hours, 2 times per day without skin issues or discomfort.  (Target Date: 09/28/19)    Baseline 10/01/19: Doffing supervision level, donning S-MinA ( at times still needs A). Tolerate wearing prosthesis 3 hours, 2 times per day without skin issues or discomfort    Time --    Period --    Status Not Met    Target Date 09/28/19      PT SHORT TERM GOAL #2   Title Pt will be independent with sink exercises/other HEP (with S from mother as needed) in order to indicate improved functional mobility.    Baseline Met 10/01/19    Time --    Period --    Status Achieved      PT SHORT TERM GOAL #3   Title Pt will improve gait speed to >/=1.82 ft/sec with RW/prosthesis in order to indicate decreased fall risk.    Baseline 10/01/19: 1.68f/sec with RW/prosthesis.    Time --    Period --    Status Not Met      PT SHORT TERM GOAL #4   Title Pt will ambulate x 558 with RW in simulated household environment at S level in order to indicate safety in home.    Baseline Met with cues for safety 10/01/19.    Time --    Period --    Status Achieved      PT SHORT TERM GOAL #5   Title Pt will improve BERG balance test to >/=22/56 in order to indicate decreased fall risk.    Baseline 10/03/19: 28/56    Time --    Period --    Status Achieved             PT Long Term Goals - 08/29/19 1528      PT LONG TERM GOAL #1   Title Pt will be able to don/doff prosthesis at S level and tolerate wear of prosthesis >75% of awake hours without change in skin integrity or tenderness.  (Target Date: 10/28/19)    Time 8    Period Weeks    Status New    Target Date 10/28/19  PT LONG TERM GOAL #2   Title Pt will improve gait speed to >/=2.42 ft/sec with LRAD and prosthesis in order to indicate safer community ambulation.    Time 8    Period Weeks    Status  New      PT LONG TERM GOAL #3   Title Pt will ambulate x 300' with LRAD at S level over indoor and unlevel paved outdoor surfaces in order to indicate safe home and limited community ambulation.    Time 8    Period Weeks    Status New      PT LONG TERM GOAL #4   Title Pt will improve BERG score to >/=26/56 in order to indicate decreased fall risk.    Time 8    Period Weeks    Status New      PT LONG TERM GOAL #5   Title Standing balance with rolling walker and prosthesis reaching 10" anteriorly, across midline and to floor with S.    Time 8    Period Weeks    Status New                 Plan - 10/08/19 1853    Clinical Impression Statement Skilled session focused on prosthetic gait training, balance with weight shifting and midline awareness, and Right hip strengthening for increased hip control with gait. Updated HEP with hip strengthening exercise.  Pt requires multimodal  cueing for Right hip control during gait.    Personal Factors and Comorbidities Comorbidity 3+;Time since onset of injury/illness/exacerbation    Comorbidities see above    Examination-Activity Limitations Bathing;Bend;Dressing;Hygiene/Grooming;Locomotion Level;Reach Overhead;Stairs;Stand;Transfers    Examination-Participation Restrictions Community Activity;Laundry;Shop;Meal Prep    Stability/Clinical Decision Making Evolving/Moderate complexity    Rehab Potential Good    PT Frequency 2x / week    PT Duration 8 weeks    PT Treatment/Interventions ADLs/Self Care Home Management;DME Instruction;Gait training;Stair training;Functional mobility training;Therapeutic activities;Therapeutic exercise;Balance training;Neuromuscular re-education;Cognitive remediation;Patient/family education;Prosthetic Training;Passive range of motion;Energy conservation;Vestibular    PT Next Visit Plan educate on sock ply adjustment, balance training and Right hip strengthening.    Consulted and Agree with Plan of Care  Patient;Family member/caregiver    Family Member Consulted mother           Patient will benefit from skilled therapeutic intervention in order to improve the following deficits and impairments:  Abnormal gait, Decreased activity tolerance, Decreased balance, Decreased cognition, Decreased endurance, Decreased knowledge of precautions, Decreased knowledge of use of DME, Decreased mobility, Decreased range of motion, Decreased safety awareness, Decreased strength, Difficulty walking, Increased edema, Impaired perceived functional ability, Impaired flexibility, Postural dysfunction  Visit Diagnosis: Other abnormalities of gait and mobility  Muscle weakness (generalized)  Unsteadiness on feet     Problem List Patient Active Problem List   Diagnosis Date Noted  . History of deep venous thrombosis 05/05/2015  . Personal history of osteosarcoma 05/05/2015  . Anemia 11/04/2014  . Abnormal finding on mammography 05/18/2014  . Major depression, recurrent ([place]) 03/19/2014  . Vision disturbance 02/28/2014  . Postprocedural state 12/31/2013  . History of biliary T-tube placement 12/31/2013  . Lupus anticoagulant disorder ([place]) 12/20/2013  . Back pain, chronic 11/07/2013  . Absence of bladder continence 08/05/2013  . History of anticoagulant therapy 07/17/2013  . Long term current use of anticoagulant therapy 07/17/2013  . Partial epilepsy with impairment of consciousness ([place]) 06/21/2013  . Intractable epilepsy ([place]) 02/28/2013  . Absolute anemia 02/28/2013  . Seizures ([place]) 02/21/2013  . Seizure (  [place]) 02/17/2013  . DVT, bilateral lower limbs ([place]) 11/07/2012  . Hypothyroid 07/04/2012  . Seizure disorder ([place]) 07/04/2012  . Chronic pain syndrome 07/04/2012  . S/P BKA (below knee amputation) ([place]) 07/04/2012  . Chronic pain associated with significant psychosocial dysfunction 07/04/2012  . Cerebrovascular accident, late effects 06/05/2012  . Status post above knee amputation  06/08/2011  . Mechanical complication of graft of bone, cartilage, muscle or tendon 05/10/2011  . Depression 05/03/2011  . 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 [place], [place], [address] Phone: [phone]   Fax:  [phone]  Name: Cassandra Andrade MRN: [id number] Date of Birth: 10/29/1965

## 2019-10-08 NOTE — Patient Instructions (Signed)
Access Code: [email or web address] URL: [email or web address] Date: 10/08/2019 Prepared by: Oneita Kras  Exercises Beginner Clam - 1-2 x daily - 5 x weekly - 2 sets - 10 reps - 3 seocnds hold

## 2019-10-10 ENCOUNTER — Other Ambulatory Visit (HOSPITAL_COMMUNITY): Payer: Self-pay | Admitting: Psychiatry

## 2019-10-10 ENCOUNTER — Ambulatory Visit: Payer: Medicare Other | Admitting: Rehabilitation

## 2019-10-10 ENCOUNTER — Encounter: Payer: Self-pay | Admitting: Rehabilitation

## 2019-10-10 ENCOUNTER — Other Ambulatory Visit: Payer: Self-pay

## 2019-10-10 ENCOUNTER — Ambulatory Visit (INDEPENDENT_AMBULATORY_CARE_PROVIDER_SITE_OTHER): Payer: Medicare Other | Admitting: Licensed Clinical Social Worker

## 2019-10-10 ENCOUNTER — Encounter (HOSPITAL_COMMUNITY): Payer: Self-pay | Admitting: Licensed Clinical Social Worker

## 2019-10-10 DIAGNOSIS — M6281 Muscle weakness (generalized): Secondary | ICD-10-CM

## 2019-10-10 DIAGNOSIS — F330 Major depressive disorder, recurrent, mild: Secondary | ICD-10-CM | POA: Diagnosis not present

## 2019-10-10 DIAGNOSIS — M25651 Stiffness of right hip, not elsewhere classified: Secondary | ICD-10-CM

## 2019-10-10 DIAGNOSIS — F419 Anxiety disorder, unspecified: Secondary | ICD-10-CM

## 2019-10-10 DIAGNOSIS — R2681 Unsteadiness on feet: Secondary | ICD-10-CM | POA: Diagnosis not present

## 2019-10-10 DIAGNOSIS — R2689 Other abnormalities of gait and mobility: Secondary | ICD-10-CM | POA: Diagnosis not present

## 2019-10-10 DIAGNOSIS — F321 Major depressive disorder, single episode, moderate: Secondary | ICD-10-CM

## 2019-10-10 NOTE — Therapy (Signed)
[facility] [address] [place], [place], [address] Phone: [phone]   Fax:  [phone]  Physical Therapy Treatment  Patient Details  Name: Cassandra Andrade MRN: [id number] Date of Birth: 08-14-1965 Referring Provider (PT): Margette Fast, MD   Encounter Date: 10/10/2019   PT End of Session - 10/10/19 1304    Visit Number 6    Number of Visits 17    Date for PT Re-Evaluation 10/28/19    Authorization Type Medicare, Medicaid, Tricare    PT Start Time 7    PT Stop Time 1147    PT Time Calculation (min) 45 min    Equipment Utilized During Treatment Gait belt    Activity Tolerance Patient tolerated treatment well    Behavior During Therapy [facility] for tasks assessed/performed           Past Medical History:  Diagnosis Date  . Anxiety   . Arthritis   . Cancer (HCC)    Osteosarcoma, right leg  . CVA (cerebrovascular accident due to intracerebral hemorrhage) ([place]) 2004  . Depression   . DVT (deep venous thrombosis) (HCC)    Left leg  . MRSA (methicillin resistant Staphylococcus aureus)   . 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 Seizures ([place])   . Thyroid disease   . 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 ABOVE KNEE LEG AMPUTATION Right    Osteosarcoma  . BRAIN SURGERY  2004   cerebral aneurysm  . FRACTURE SURGERY     Rt. femur, folllowed by MRSA  . TOTAL KNEE ARTHROPLASTY Right     There were no vitals filed for this visit.   Subjective Assessment - 10/10/19 1100    Subjective No changes or falls since last visit, wearing leg 6 hours, 2x/day.  No skin issues.    Limitations Walking;House hold activities;Standing    How long can you stand comfortably? approx 30 mins per pt report    How long can you walk comfortably? 15-20 mins per pt report    Patient Stated Goals To walk with a cane if possible and get  my balance better.    Currently in Pain? No/denies                             [facility] Adult PT Treatment/Exercise - 10/10/19 1105      Transfers   Transfers Sit to Stand;Stand to Sit    Sit to Stand 5: Supervision    Sit to Stand Details Verbal cues for sequencing;Verbal cues for technique;Verbal cues for precautions/safety    Stand to Sit 5: Supervision    Stand to Sit Details (indicate cue type and reason) Verbal cues for sequencing;Verbal cues for technique;Verbal cues for precautions/safety      Ambulation/Gait   Ambulation/Gait Yes    Ambulation/Gait Assistance 4: Min guard;4: Min assist    Ambulation/Gait Assistance Details Min/guard to min A for facilitation of improved hip protraction during stance phase of gait on both R and LLE.  Pt tends to keep R hip very retracted and rotates trunk R but does show improvement with facilitation at pelvis and also at trunk/ribcage.  Pt also needs continued cues for wider step width esp on the LLE.  Pt did very well following NMR tasks in // bars and counter top.     Ambulation Distance (Feet)  115 Feet   x 2reps    Assistive device Rolling walker;Prosthesis    Gait Pattern Step-through pattern;Decreased stance time - right;Decreased step length - left;Decreased stride length;Decreased weight shift to right;Right circumduction;Lateral hip instability;Narrow base of support    Ambulation Surface Level;Indoor      Neuro Re-ed    Neuro Re-ed Details  In // bars worked on advancing/retrostepping RLE for NMR to LLE with improved proximal hip and quad activation during stance phase of gait as she tends to keep L knee flexed during stance and loses balance to the L.  Had pt face mirror for visual feedback after doing facing gym did not produce great results.  Also went to counter top to perform with R hand on counter and L hand on walker for BUE support to carryover to home exercise.  Again working on forward stepping/retro stepping of RLE,  shifting onto RLE (forward hip protraction) then back onto LLE with retro step of RLE (cues to make single step).  Cues for maintaining wider BOS as she tends to keep feet close together.  Performed many times during session for improved carryover.                    PT Education - 10/10/19 1304    Education Details trying to work on forward/backwards R stepping at counter top at home.    Person(s) Educated Patient    Methods Explanation;Demonstration    Comprehension Verbalized understanding;Returned demonstration;Need further instruction            PT Short Term Goals - 10/05/19 0830      PT SHORT TERM GOAL #1   Title Pt will be don/doff prosthesis at S level and tolerate wearing prosthesis 5 hours, 2 times per day without skin issues or discomfort.  (Target Date: 09/28/19)    Baseline 10/01/19: Doffing supervision level, donning S-MinA ( at times still needs A). Tolerate wearing prosthesis 3 hours, 2 times per day without skin issues or discomfort    Time --    Period --    Status Not Met    Target Date 09/28/19      PT SHORT TERM GOAL #2   Title Pt will be independent with sink exercises/other HEP (with S from mother as needed) in order to indicate improved functional mobility.    Baseline Met 10/01/19    Time --    Period --    Status Achieved      PT SHORT TERM GOAL #3   Title Pt will improve gait speed to >/=1.82 ft/sec with RW/prosthesis in order to indicate decreased fall risk.    Baseline 10/01/19: 1.48ft/sec with RW/prosthesis.    Time --    Period --    Status Not Met      PT SHORT TERM GOAL #4   Title Pt will ambulate x 59' with RW in simulated household environment at S level in order to indicate safety in home.    Baseline Met with cues for safety 10/01/19.    Time --    Period --    Status Achieved      PT SHORT TERM GOAL #5   Title Pt will improve BERG balance test to >/=22/56 in order to indicate decreased fall risk.    Baseline 10/03/19: 28/56    Time  --    Period --    Status Achieved             PT Long Term Goals -  08/29/19 1528      PT LONG TERM GOAL #1   Title Pt will be able to don/doff prosthesis at S level and tolerate wear of prosthesis >75% of awake hours without change in skin integrity or tenderness.  (Target Date: 10/28/19)    Time 8    Period Weeks    Status New    Target Date 10/28/19      PT LONG TERM GOAL #2   Title Pt will improve gait speed to >/=2.42 ft/sec with LRAD and prosthesis in order to indicate safer community ambulation.    Time 8    Period Weeks    Status New      PT LONG TERM GOAL #3   Title Pt will ambulate x 300' with LRAD at S level over indoor and unlevel paved outdoor surfaces in order to indicate safe home and limited community ambulation.    Time 8    Period Weeks    Status New      PT LONG TERM GOAL #4   Title Pt will improve BERG score to >/=26/56 in order to indicate decreased fall risk.    Time 8    Period Weeks    Status New      PT LONG TERM GOAL #5   Title Standing balance with rolling walker and prosthesis reaching 10" anteriorly, across midline and to floor with S.    Time 8    Period Weeks    Status New                 Plan - 10/10/19 1307    Clinical Impression Statement Skilled session focused on NMR to LLE as well as improving quality of weight shift over R prosthesis during stance phase of gait.  Pt did well during session but worry about carryover due to poor cognition.    Personal Factors and Comorbidities Comorbidity 3+;Time since onset of injury/illness/exacerbation    Comorbidities see above    Examination-Activity Limitations Bathing;Bend;Dressing;Hygiene/Grooming;Locomotion Level;Reach Overhead;Stairs;Stand;Transfers    Examination-Participation Restrictions Community Activity;Laundry;Shop;Meal Prep    Stability/Clinical Decision Making Evolving/Moderate complexity    Rehab Potential Good    PT Frequency 2x / week    PT Duration 8 weeks    PT  Treatment/Interventions ADLs/Self Care Home Management;DME Instruction;Gait training;Stair training;Functional mobility training;Therapeutic activities;Therapeutic exercise;Balance training;Neuromuscular re-education;Cognitive remediation;Patient/family education;Prosthetic Training;Passive range of motion;Energy conservation;Vestibular    PT Next Visit Plan educate on sock ply adjustment, balance training and Right hip strengthening. LLE NMR from previous stroke    Consulted and Agree with Plan of Care Patient;Family member/caregiver    Family Member Consulted mother           Patient will benefit from skilled therapeutic intervention in order to improve the following deficits and impairments:  Abnormal gait, Decreased activity tolerance, Decreased balance, Decreased cognition, Decreased endurance, Decreased knowledge of precautions, Decreased knowledge of use of DME, Decreased mobility, Decreased range of motion, Decreased safety awareness, Decreased strength, Difficulty walking, Increased edema, Impaired perceived functional ability, Impaired flexibility, Postural dysfunction  Visit Diagnosis: Other abnormalities of gait and mobility  Muscle weakness (generalized)  Unsteadiness on feet  Hip stiffness, right     Problem List Patient Active Problem List   Diagnosis Date Noted  . History of deep venous thrombosis 05/05/2015  . Personal history of osteosarcoma 05/05/2015  . Anemia 11/04/2014  . Abnormal finding on mammography 05/18/2014  . Major depression, recurrent ([place]) 03/19/2014  . Vision disturbance 02/28/2014  . Postprocedural state 12/31/2013  .  History of biliary T-tube placement 12/31/2013  . Lupus anticoagulant disorder ([place]) 12/20/2013  . Back pain, chronic 11/07/2013  . Absence of bladder continence 08/05/2013  . History of anticoagulant therapy 07/17/2013  . Long term current use of anticoagulant therapy 07/17/2013  . Partial epilepsy with impairment of  consciousness ([place]) 06/21/2013  . Intractable epilepsy ([place]) 02/28/2013  . Absolute anemia 02/28/2013  . Seizures ([place]) 02/21/2013  . Seizure ([place]) 02/17/2013  . DVT, bilateral lower limbs ([place]) 11/07/2012  . Hypothyroid 07/04/2012  . Seizure disorder ([place]) 07/04/2012  . Chronic pain syndrome 07/04/2012  . S/P BKA (below knee amputation) ([place]) 07/04/2012  . Chronic pain associated with significant psychosocial dysfunction 07/04/2012  . Cerebrovascular accident, late effects 06/05/2012  . Status post above knee amputation 06/08/2011  . Mechanical complication of graft of bone, cartilage, muscle or tendon 05/10/2011  . Depression 05/03/2011  . 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

## 2019-10-10 NOTE — Progress Notes (Signed)
Virtual Visit via Telephone Note  I connected with Cassandra Andrade on 10/10/19 at  2:30 PM EDT by telephone and verified that I am speaking with the correct person using two identifiers.  Location: Patient: home Provider: office   I discussed the limitations, risks, security and privacy concerns of performing an evaluation and management service by telephone and the availability of in person appointments. I also discussed with the patient that there may be a patient responsible charge related to this service. The patient expressed understanding and agreed to proceed.  Type of Therapy: Individual Therapy   Treatment Goals addressed: improve psychiatric symptoms, elevate mood, Improve unhelpful thought patterns, interpersonal relationship skills, learn about diagnosis, healthy coping skills   Interventions: Motivational Interviewing   Summary: Cassandra Andrade is a 54 y.o. female who presents with Major Depressive Disorder, recurrent, mild   Suicidal/Homicidal: No -without intent/plan   Therapist Response: Madesyn met with clinician for an individual session. [name] discussed her psychiatric symptoms, her current life events and her homework. [name] reports that she has been doing great. She identified that with physical therapy, she is walking much more, recognizing that she was standing in her own way mentally, which is why it took so long to get motivated for walking. Clinician reflected current motivation levels and identified that this was exactly what she needed to get her moving and feeling confident.    Plan: Return again in 4 weeks.   Diagnosis: Axis I: Major Depressive Disorder, recurrent, mild         I discussed the assessment and treatment plan with the patient. The patient was provided an opportunity to ask questions and all were answered. The patient agreed with the plan and demonstrated an understanding of the instructions.   The patient was advised to call back or seek an  in-person evaluation if the symptoms worsen or if the condition fails to improve as anticipated.  I provided 30 minutes of non-face-to-face time during this encounter.   Cassandra Curling, LCSW

## 2019-10-12 DIAGNOSIS — R7989 Other specified abnormal findings of blood chemistry: Secondary | ICD-10-CM | POA: Diagnosis not present

## 2019-10-15 ENCOUNTER — Ambulatory Visit: Payer: Medicare Other | Admitting: Physical Therapy

## 2019-10-17 ENCOUNTER — Ambulatory Visit: Payer: Medicare Other | Admitting: Rehabilitation

## 2019-10-17 ENCOUNTER — Encounter: Payer: Self-pay | Admitting: Rehabilitation

## 2019-10-17 ENCOUNTER — Other Ambulatory Visit: Payer: Self-pay

## 2019-10-17 DIAGNOSIS — R2681 Unsteadiness on feet: Secondary | ICD-10-CM

## 2019-10-17 DIAGNOSIS — R2689 Other abnormalities of gait and mobility: Secondary | ICD-10-CM | POA: Diagnosis not present

## 2019-10-17 DIAGNOSIS — M6281 Muscle weakness (generalized): Secondary | ICD-10-CM

## 2019-10-17 DIAGNOSIS — M25651 Stiffness of right hip, not elsewhere classified: Secondary | ICD-10-CM | POA: Diagnosis not present

## 2019-10-17 NOTE — Patient Instructions (Signed)
Standing exercises at Counter top:   1: Sideways with right hand on counter, left hand on walker, stepping R leg forward (make left leg strong) then shifting weight onto right leg (move hip forward over leg).  Then shift hip back onto left leg (make it strong) and step right leg back (single step).    2: Facing counter top use left hand to reach up and to the left shifting weight onto left leg (make it strong) (Mom can help with weight shift to the left by keeping her hip on your hip).  Then repeat on right side.  Each time return to center.  Do 10 reps each side.   3:  Side stepping holding onto counter.  Stand tall and step R leg to the R (keep R hip strong when moving left leg) and then repeat opposite direction (keep left leg strong when moving R leg).  Do 4 laps down and back at counter (approx 10 feet).

## 2019-10-17 NOTE — Therapy (Signed)
[facility] [address] [place], [place], [address] Phone: [phone]   Fax:  [phone]  Physical Therapy Treatment  Patient Details  Name: Cassandra Andrade MRN: [id number] Date of Birth: 1965-08-09 Referring Provider (PT): Margette Fast, MD   Encounter Date: 10/17/2019   PT End of Session - 10/17/19 1354    Visit Number 7    Number of Visits 17    Date for PT Re-Evaluation 10/28/19    Authorization Type Medicare, Medicaid, T[employer]    PT Start Time 1150   pt went to restroom x 5 mins, only billing 35 mins   PT Stop Time 1230    PT Time Calculation (min) 40 min    Equipment Utilized During Treatment Gait belt    Activity Tolerance Patient tolerated treatment well    Behavior During Therapy [facility] for tasks assessed/performed           Past Medical History:  Diagnosis Date  . Anxiety   . Arthritis   . Cancer (HCC)    Osteosarcoma, right leg  . CVA (cerebrovascular accident due to intracerebral hemorrhage) ([place]) 2004  . Depression   . DVT (deep venous thrombosis) (HCC)    Left leg  . MRSA (methicillin resistant Staphylococcus aureus)   . 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 Seizures ([place])   . Thyroid disease   . 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 ABOVE KNEE LEG AMPUTATION Right    Osteosarcoma  . BRAIN SURGERY  2004   cerebral aneurysm  . FRACTURE SURGERY     Rt. femur, folllowed by MRSA  . TOTAL KNEE ARTHROPLASTY Right     There were no vitals filed for this visit.   Subjective Assessment - 10/17/19 1133    Subjective no falls since last visit, no other changes.  Pt with questions regarding different types of prostheses.    Limitations Walking;House hold activities;Standing    How long can you stand comfortably? approx 30 mins per pt report    How long can you walk comfortably? 15-20 mins per pt  report    Patient Stated Goals To walk with a cane if possible and get my balance better.    Currently in Pain? No/denies               Prosthetics Assessment - 10/17/19 1225      Prosthetics   Prosthetic Care Independent with Skin check;Residual limb care;Care of non-amputated limb;Prosthetic cleaning;Ply sock cleaning    Prosthetic Care Dependent with Correct ply sock adjustment;Proper wear schedule/adjustment;Proper weight-bearing schedule/adjustment    Prosthetic Care Comments  Pt wearing 6hrs 2x/day.  Pt with many questions regarding getting new prosthesis (would like more computerized knee component).  PT educated that this would greatly depend on functional level and expected outcomes with therapy.  Discussed that those components are for more community functioning patients who will be returning to work, playing/working outdoors, going up/down stairs/ramp/in grass.  Reviewed these on her phone with her and educated that she will likely not qualify for this leg and can be very functional with her current prosthesis with increased training.  Pt verbalized understanding.     Donning prosthesis  Min assist    Doffing prosthesis  Supervision                        [facility]  PT Treatment/Exercise - 10/17/19 1225      Transfers   Transfers Sit to Stand;Stand to Sit    Sit to Stand 5: Supervision    Sit to Stand Details Verbal cues for sequencing;Verbal cues for technique;Verbal cues for precautions/safety    Sit to Stand Details (indicate cue type and reason) Continues to require cues for improved L foot placement and hand placement     Stand to Sit 5: Supervision    Stand to Sit Details (indicate cue type and reason) Verbal cues for sequencing;Verbal cues for technique;Verbal cues for precautions/safety      Ambulation/Gait   Ambulation/Gait Yes    Ambulation/Gait Assistance 4: Min guard    Ambulation/Gait Assistance Details Light tactile cues throughout session  (from one task to another) for improved R hip protraction and dec trunk rotation,     Ambulation Distance (Feet) 80 Feet   plus another 35, 20   Assistive device Rolling walker;Prosthesis    Gait Pattern Step-through pattern;Decreased stance time - right;Decreased step length - left;Decreased stride length;Decreased weight shift to right;Right circumduction;Lateral hip instability;Narrow base of support    Ambulation Surface Level;Indoor             Standing exercises at [employer] top:   1: Sideways with right hand on counter, left hand on walker, stepping R leg forward (make left leg strong) then shifting weight onto right leg (move hip forward over leg).  Then shift hip back onto left leg (make it strong) and step right leg back (single step).    2: Facing counter top use left hand to reach up and to the left shifting weight onto left leg (make it strong) (Mom can help with weight shift to the left by keeping her hip on your hip).  Then repeat on right side.  Each time return to center.  Do 10 reps each side.   3:  Side stepping holding onto counter.  Stand tall and step R leg to the R (keep R hip strong when moving left leg) and then repeat opposite direction (keep left leg strong when moving R leg).  Do 4 laps down and back at counter (approx 10 feet).   Performed the above during session with mother present with PT providing cues and demo to mother on how to cue/assist pt at home.       PT Education - 10/17/19 1354    Education Details working with mother to do counter top exercises.    Person(s) Educated Patient;Parent(s)    Methods Explanation;Demonstration;Tactile cues;Handout    Comprehension Verbalized understanding;Returned demonstration;Need further instruction            PT Short Term Goals - 10/05/19 0830      PT SHORT TERM GOAL #1   Title Pt will be don/doff prosthesis at S level and tolerate wearing prosthesis 5 hours, 2 times per day without skin issues or  discomfort.  (Target Date: 09/28/19)    Baseline 10/01/19: Doffing supervision level, donning S-MinA ( at times still needs A). Tolerate wearing prosthesis 3 hours, 2 times per day without skin issues or discomfort    Time --    Period --    Status Not Met    Target Date 09/28/19      PT SHORT TERM GOAL #2   Title Pt will be independent with sink exercises/other HEP (with S from mother as needed) in order to indicate improved functional mobility.    Baseline Met 10/01/19    Time --  Period --    Status Achieved      PT SHORT TERM GOAL #3   Title Pt will improve gait speed to >/=1.82 ft/sec with RW/prosthesis in order to indicate decreased fall risk.    Baseline 10/01/19: 1.55ft/sec with RW/prosthesis.    Time --    Period --    Status Not Met      PT SHORT TERM GOAL #4   Title Pt will ambulate x 92' with RW in simulated household environment at S level in order to indicate safety in home.    Baseline Met with cues for safety 10/01/19.    Time --    Period --    Status Achieved      PT SHORT TERM GOAL #5   Title Pt will improve BERG balance test to >/=22/56 in order to indicate decreased fall risk.    Baseline 10/03/19: 28/56    Time --    Period --    Status Achieved             PT Long Term Goals - 08/29/19 1528      PT LONG TERM GOAL #1   Title Pt will be able to don/doff prosthesis at S level and tolerate wear of prosthesis >75% of awake hours without change in skin integrity or tenderness.  (Target Date: 10/28/19)    Time 8    Period Weeks    Status New    Target Date 10/28/19      PT LONG TERM GOAL #2   Title Pt will improve gait speed to >/=2.42 ft/sec with LRAD and prosthesis in order to indicate safer community ambulation.    Time 8    Period Weeks    Status New      PT LONG TERM GOAL #3   Title Pt will ambulate x 300' with LRAD at S level over indoor and unlevel paved outdoor surfaces in order to indicate safe home and limited community ambulation.    Time 8     Period Weeks    Status New      PT LONG TERM GOAL #4   Title Pt will improve BERG score to >/=26/56 in order to indicate decreased fall risk.    Time 8    Period Weeks    Status New      PT LONG TERM GOAL #5   Title Standing balance with rolling walker and prosthesis reaching 10" anteriorly, across midline and to floor with S.    Time 8    Period Weeks    Status New                 Plan - 10/17/19 1355    Clinical Impression Statement Skilled session focused on education of varying prostheses and who and when they are used.  Also reviewed counter top and added 2 additional counter top exercises for both LLE NMR and R prosthetic WB and balance.  Mother present during session to go over with her and provide cues/demo on how to assist/cue pt at home.    Personal Factors and Comorbidities Comorbidity 3+;Time since onset of injury/illness/exacerbation    Comorbidities see above    Examination-Activity Limitations Bathing;Bend;Dressing;Hygiene/Grooming;Locomotion Level;Reach Overhead;Stairs;Stand;Transfers    Examination-Participation Restrictions Community Activity;Laundry;Shop;Meal Prep    Stability/Clinical Decision Making Evolving/Moderate complexity    Rehab Potential Good    PT Frequency 2x / week    PT Duration 8 weeks    PT Treatment/Interventions ADLs/Self Care Home Management;DME Instruction;Gait training;Stair  training;Functional mobility training;Therapeutic activities;Therapeutic exercise;Balance training;Neuromuscular re-education;Cognitive remediation;Patient/family education;Prosthetic Training;Passive range of motion;Energy conservation;Vestibular    PT Next Visit Plan see how counter top exercises are going, educate on sock ply adjustment, balance training and Right hip strengthening. LLE NMR from previous stroke    PT Home Exercise Plan will plan to recert at end of this cert    Consulted and Agree with Plan of Care Patient;Family member/caregiver    Family Member  Consulted mother           Patient will benefit from skilled therapeutic intervention in order to improve the following deficits and impairments:  Abnormal gait, Decreased activity tolerance, Decreased balance, Decreased cognition, Decreased endurance, Decreased knowledge of precautions, Decreased knowledge of use of DME, Decreased mobility, Decreased range of motion, Decreased safety awareness, Decreased strength, Difficulty walking, Increased edema, Impaired perceived functional ability, Impaired flexibility, Postural dysfunction  Visit Diagnosis: Other abnormalities of gait and mobility  Muscle weakness (generalized)  Unsteadiness on feet  Hip stiffness, right     Problem List Patient Active Problem List   Diagnosis Date Noted  . History of deep venous thrombosis 05/05/2015  . Personal history of osteosarcoma 05/05/2015  . Anemia 11/04/2014  . Abnormal finding on mammography 05/18/2014  . Major depression, recurrent ([place]) 03/19/2014  . Vision disturbance 02/28/2014  . Postprocedural state 12/31/2013  . History of biliary T-tube placement 12/31/2013  . Lupus anticoagulant disorder ([place]) 12/20/2013  . Back pain, chronic 11/07/2013  . Absence of bladder continence 08/05/2013  . History of anticoagulant therapy 07/17/2013  . Long term current use of anticoagulant therapy 07/17/2013  . Partial epilepsy with impairment of consciousness ([place]) 06/21/2013  . Intractable epilepsy ([place]) 02/28/2013  . Absolute anemia 02/28/2013  . Seizures ([place]) 02/21/2013  . Seizure ([place]) 02/17/2013  . DVT, bilateral lower limbs ([place]) 11/07/2012  . Hypothyroid 07/04/2012  . Seizure disorder ([place]) 07/04/2012  . Chronic pain syndrome 07/04/2012  . S/P BKA (below knee amputation) ([place]) 07/04/2012  . Chronic pain associated with significant psychosocial dysfunction 07/04/2012  . Cerebrovascular accident, late effects 06/05/2012  . Status post above knee amputation 06/08/2011  . Mechanical  complication of graft of bone, cartilage, muscle or tendon 05/10/2011  . Depression 05/03/2011  . 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 [place], [place], [address] Phone: [phone]   Fax:  [phone] 10/17/19, 1:58 PM  Name: Cassandra Andrade MRN: [id number] Date of Birth: 11/19/1965

## 2019-10-22 ENCOUNTER — Ambulatory Visit: Payer: Medicare Other | Admitting: Physical Therapy

## 2019-10-23 ENCOUNTER — Other Ambulatory Visit (HOSPITAL_COMMUNITY): Payer: Self-pay | Admitting: Psychiatry

## 2019-10-23 DIAGNOSIS — F419 Anxiety disorder, unspecified: Secondary | ICD-10-CM

## 2019-10-23 DIAGNOSIS — F321 Major depressive disorder, single episode, moderate: Secondary | ICD-10-CM

## 2019-10-26 ENCOUNTER — Other Ambulatory Visit: Payer: Self-pay

## 2019-10-26 ENCOUNTER — Encounter: Payer: Self-pay | Admitting: Physical Therapy

## 2019-10-26 ENCOUNTER — Ambulatory Visit: Payer: Medicare Other | Attending: Nurse Practitioner | Admitting: Physical Therapy

## 2019-10-26 DIAGNOSIS — R2689 Other abnormalities of gait and mobility: Secondary | ICD-10-CM | POA: Insufficient documentation

## 2019-10-26 DIAGNOSIS — R2681 Unsteadiness on feet: Secondary | ICD-10-CM | POA: Diagnosis not present

## 2019-10-26 DIAGNOSIS — M6281 Muscle weakness (generalized): Secondary | ICD-10-CM

## 2019-10-26 NOTE — Therapy (Addendum)
[facility] [address] [place], [place], [address] Phone: [phone]   Fax:  [phone]  Physical Therapy Treatment and Re-cert  Patient Details  Name: Cassandra Andrade MRN: [id number] Date of Birth: Apr 01, 1965 Referring Provider (PT): Margette Fast, MD   Encounter Date: 10/26/2019   PT End of Session - 10/26/19 1244    Visit Number 8    Number of Visits 17    Date for PT Re-Evaluation 10/28/19    Authorization Type Medicare, Medicaid, Tricare    PT Start Time 1233    PT Stop Time 1315   pt checked out early when mom when to schedule during session   PT Time Calculation (min) 42 min    Equipment Utilized During Treatment Gait belt    Activity Tolerance Patient tolerated treatment well    Behavior During Therapy [facility] for tasks assessed/performed           Past Medical History:  Diagnosis Date  . Anxiety   . Arthritis   . Cancer (HCC)    Osteosarcoma, right leg  . CVA (cerebrovascular accident due to intracerebral hemorrhage) ([place]) 2004  . Depression   . DVT (deep venous thrombosis) (HCC)    Left leg  . MRSA (methicillin resistant Staphylococcus aureus)   . 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 Seizures ([place])   . Thyroid disease   . 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 ABOVE KNEE LEG AMPUTATION Right    Osteosarcoma  . BRAIN SURGERY  2004   cerebral aneurysm  . FRACTURE SURGERY     Rt. femur, folllowed by MRSA  . TOTAL KNEE ARTHROPLASTY Right     There were no vitals filed for this visit.   Subjective Assessment - 10/26/19 1238    Subjective Working on getting an order from the doctor to get new prosthesis/socket as this one is not fitting. Have contacted [employer] who stated they needed the script. Appears that the current prosthesis is about 54 years old. Pt has has weight gain per mom.    Patient is  accompained by: Family member   mom, [name]   Limitations Walking;House hold activities;Standing    How long can you stand comfortably? approx 30 mins per pt report    Patient Stated Goals To walk with a cane if possible and get my balance better.    Currently in Pain? No/denies              [facility] PT Assessment - 10/26/19 1250      Berg Balance Test   Sit to Stand Able to stand  independently using hands    Standing Unsupported Able to stand 2 minutes with supervision    Sitting with Back Unsupported but Feet Supported on Floor or Stool Able to sit safely and securely 2 minutes    Stand to Sit Controls descent by using hands    Transfers Able to transfer with verbal cueing and /or supervision   turned a full 180* to go mat to chair, 90* back to mat   Standing Unsupported with Eyes Closed Able to stand 3 seconds   posterior loss of balance   Standing Unsupported with Feet Together Able to place feet together independently but unable to hold for 30 seconds    From Standing, Reach Forward with Outstretched Arm Can reach forward >5 cm safely (2")   3  inches   From Standing Position, Pick up Object from Floor Unable to pick up and needs supervision    From Standing Position, Turn to Look Behind Over each Shoulder Turn sideways only but maintains balance    Turn 360 Degrees Needs assistance while turning    Standing Unsupported, Alternately Place Feet on Step/Stool Needs assistance to keep from falling or unable to try    Standing Unsupported, One Foot in [place] to take small step independently and hold 30 seconds    Standing on One Leg Unable to try or needs assist to prevent fall    Total Score 26    Berg comment: < 36 high risk for falls (close to 100%)                  [facility] Adult PT Treatment/Exercise - 10/26/19 1250      Transfers   Transfers Sit to Stand;Stand to Sit    Sit to Stand 5: Supervision    Stand to Sit 5: Supervision      Ambulation/Gait    Ambulation/Gait Yes    Ambulation/Gait Assistance 4: Min guard    Ambulation/Gait Assistance Details cues for increased base of support, right step placement and increased bil step length. assist for right hip protraction.    Ambulation Distance (Feet) 142 Feet   x1   Assistive device Rolling walker;Prosthesis    Gait Pattern Step-through pattern;Decreased stance time - right;Decreased step length - left;Decreased stride length;Decreased weight shift to right;Right circumduction;Lateral hip instability;Narrow base of support    Ambulation Surface Level;Indoor    Gait velocity 46.79 sec's= 0.71 ft/sec with RW/prosthesis                 PT Short Term Goals - 10/05/19 0830      PT SHORT TERM GOAL #1   Title Pt will be don/doff prosthesis at S level and tolerate wearing prosthesis 5 hours, 2 times per day without skin issues or discomfort.  (Target Date: 09/28/19)    Baseline 10/01/19: Doffing supervision level, donning S-MinA ( at times still needs A). Tolerate wearing prosthesis 3 hours, 2 times per day without skin issues or discomfort    Time --    Period --    Status Not Met    Target Date 09/28/19      PT SHORT TERM GOAL #2   Title Pt will be independent with sink exercises/other HEP (with S from mother as needed) in order to indicate improved functional mobility.    Baseline Met 10/01/19    Time --    Period --    Status Achieved      PT SHORT TERM GOAL #3   Title Pt will improve gait speed to >/=1.82 ft/sec with RW/prosthesis in order to indicate decreased fall risk.    Baseline 10/01/19: 1.93f/sec with RW/prosthesis.    Time --    Period --    Status Not Met      PT SHORT TERM GOAL #4   Title Pt will ambulate x 524 with RW in simulated household environment at S level in order to indicate safety in home.    Baseline Met with cues for safety 10/01/19.    Time --    Period --    Status Achieved      PT SHORT TERM GOAL #5   Title Pt will improve BERG balance test to  >/=22/56 in order to indicate decreased fall risk.    Baseline 10/03/19: 28/56  Time --    Period --    Status Achieved            PT Long Term Goals - 10/26/19 1245      PT LONG TERM GOAL #1   Title Pt will be able to don/doff prosthesis at S level and tolerate wear of prosthesis >75% of awake hours without change in skin integrity or tenderness.  (Target Date: 10/28/19)    Baseline 10/26/19: missed 2 days this week, prior to that was wearing prosthesis daily, for most of awake hours. pt is self donning/doffing prothesis    Status Partially Met      PT LONG TERM GOAL #2   Title Pt will improve gait speed to >/=2.42 ft/sec with LRAD and prosthesis in order to indicate safer community ambulation.    Baseline 10/26/19: 0.71 ft/sec with RW/prosthesis    Time --    Period --    Status Not Met      PT LONG TERM GOAL #3   Title Pt will ambulate x 300' with LRAD at S level over indoor and unlevel paved outdoor surfaces in order to indicate safe home and limited community ambulation.    Baseline 10/27/19: not met to date with sessions, 142 feet today    Time --    Period --    Status Not Met      PT LONG TERM GOAL #4   Title Pt will improve BERG score to >/=26/56 in order to indicate decreased fall risk.    Baseline 10/27/19: 26/56 scored today    Time --    Period --    Status Achieved      PT LONG TERM GOAL #5   Title Standing balance with rolling walker and prosthesis reaching 10" anteriorly, across midline and to floor with S.    Baseline 10/26/19: not tested due to time constraints    Time --    Period --    Status Unable to assess               PT Short Term Goals - 11/14/19 1449      PT SHORT TERM GOAL #1   Title Pt will be don/doff prosthesis at mod I level and tolerate wearing prosthesis 6 hours, 2 times per day without skin issues or discomfort.  (Target Date: 11/25/19)    Baseline 10/01/19: Doffing supervision level, donning S-MinA ( at times still needs A). Tolerate  wearing prosthesis 3 hours, 2 times per day without skin issues or discomfort    Status Revised    Target Date 09/28/19      PT SHORT TERM GOAL #2   Title Pt will be independent with sink exercises/other HEP (with S from mother as needed) in order to indicate improved functional mobility.    Baseline --    Status On-going      PT SHORT TERM GOAL #3   Title Pt will improve gait speed to >/=1.82 ft/sec with RW/prosthesis in order to indicate decreased fall risk.    Baseline 10/01/19: 1.30f/sec with RW/prosthesis.    Status On-going      PT SHORT TERM GOAL #4   Title Pt will ambulate x 150' with RW in simulated household environment at S level in order to indicate safety in home.    Baseline Met with cues for safety 10/01/19.    Status Revised      PT SHORT TERM GOAL #5   Title Pt will improve BERG balance test  to >/=29/56 in order to indicate decreased fall risk.    Baseline 10/03/19: 28/56    Time 4    Status Revised              PT Long Term Goals - 11/14/19 1448      PT LONG TERM GOAL #1   Title Pt will be able to don/doff prosthesis at S level and tolerate wear of prosthesis >90% of awake hours without change in skin integrity or tenderness.  (Target Date: 12/23/19)    Baseline 10/26/19: missed 2 days this week, prior to that was wearing prosthesis daily, for most of awake hours. pt is self donning/doffing prothesis    Time 8    Period Weeks    Status Revised      PT LONG TERM GOAL #2   Title Pt will improve gait speed to >/=2.42 ft/sec with LRAD and prosthesis in order to indicate safer community ambulation.    Baseline 10/26/19: 0.71 ft/sec with RW/prosthesis    Time 8    Status On-going      PT LONG TERM GOAL #3   Title Pt will ambulate x 300' with LRAD at S level over indoor and unlevel paved outdoor surfaces in order to indicate safe home and limited community ambulation.    Baseline 10/27/19: not met to date with sessions, 142 feet today    Status On-going      PT LONG  TERM GOAL #4   Title Pt will improve BERG score to >/=32/56 in order to indicate decreased fall risk.    Baseline 10/27/19: 26/56 scored today    Status Revised      PT LONG TERM GOAL #5   Title Standing balance with rolling walker and prosthesis reaching 10" anteriorly, across midline and to floor with S.    Baseline 10/26/19: not tested due to time constraints    Status On-going          Goals and recert done by: Cameron Sprang, PT, MPT [facility] [address] [place], [place], [address] Phone: [phone]   Fax:  [phone] 11/14/19, 2:52 PM      Plan - 10/26/19 1244    Clinical Impression Statement Today's skilled session focused on progress toward LTG with primary PT planning to recert. Pt with decrease in Berg Balance test and gait speed this session. Pt and mom did report she had a seizure this morining which could have impacted her scores today as the pt did states she was tired. The pt should benefit from continued PT to progress toward goals.    Personal Factors and Comorbidities Comorbidity 3+;Time since onset of injury/illness/exacerbation    Comorbidities see above    Examination-Activity Limitations Bathing;Bend;Dressing;Hygiene/Grooming;Locomotion Level;Reach Overhead;Stairs;Stand;Transfers    Examination-Participation Restrictions Community Activity;Laundry;Shop;Meal Prep    Stability/Clinical Decision Making Evolving/Moderate complexity    Rehab Potential Good    PT Frequency 2x / week    PT Duration 8 weeks    PT Treatment/Interventions ADLs/Self Care Home Management;DME Instruction;Gait training;Stair training;Functional mobility training;Therapeutic activities;Therapeutic exercise;Balance training;Neuromuscular re-education;Cognitive remediation;Patient/family education;Prosthetic Training;Passive range of motion;Energy conservation;Vestibular    PT Next Visit Plan Primary PT to recert. see how counter top exercises are  going, educate on sock ply adjustment, balance training and Right hip strengthening. LLE NMR from previous stroke    PT Home Exercise Plan will plan to recert at end of this cert    Consulted and Agree with Plan of Care Patient;Family member/caregiver  Family Member Consulted mother           Patient will benefit from skilled therapeutic intervention in order to improve the following deficits and impairments:  Abnormal gait, Decreased activity tolerance, Decreased balance, Decreased cognition, Decreased endurance, Decreased knowledge of precautions, Decreased knowledge of use of DME, Decreased mobility, Decreased range of motion, Decreased safety awareness, Decreased strength, Difficulty walking, Increased edema, Impaired perceived functional ability, Impaired flexibility, Postural dysfunction  Visit Diagnosis: Other abnormalities of gait and mobility  Muscle weakness (generalized)  Unsteadiness on feet     Problem List Patient Active Problem List   Diagnosis Date Noted  . History of deep venous thrombosis 05/05/2015  . Personal history of osteosarcoma 05/05/2015  . Anemia 11/04/2014  . Abnormal finding on mammography 05/18/2014  . Major depression, recurrent ([place]) 03/19/2014  . Vision disturbance 02/28/2014  . Postprocedural state 12/31/2013  . History of biliary T-tube placement 12/31/2013  . Lupus anticoagulant disorder ([place]) 12/20/2013  . Back pain, chronic 11/07/2013  . Absence of bladder continence 08/05/2013  . History of anticoagulant therapy 07/17/2013  . Long term current use of anticoagulant therapy 07/17/2013  . Partial epilepsy with impairment of consciousness ([place]) 06/21/2013  . Intractable epilepsy ([place]) 02/28/2013  . Absolute anemia 02/28/2013  . Seizures ([place]) 02/21/2013  . Seizure ([place]) 02/17/2013  . DVT, bilateral lower limbs ([place]) 11/07/2012  . Hypothyroid 07/04/2012  . Seizure disorder ([place]) 07/04/2012  . Chronic pain syndrome 07/04/2012  . S/P BKA  (below knee amputation) ([place]) 07/04/2012  . Chronic pain associated with significant psychosocial dysfunction 07/04/2012  . Cerebrovascular accident, late effects 06/05/2012  . Status post above knee amputation 06/08/2011  . Mechanical complication of graft of bone, cartilage, muscle or tendon 05/10/2011  . Depression 05/03/2011  . 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

## 2019-10-30 ENCOUNTER — Encounter: Payer: Self-pay | Admitting: Physical Therapy

## 2019-10-30 ENCOUNTER — Ambulatory Visit: Payer: Medicare Other | Admitting: Physical Therapy

## 2019-10-30 ENCOUNTER — Other Ambulatory Visit: Payer: Self-pay

## 2019-10-30 DIAGNOSIS — R2681 Unsteadiness on feet: Secondary | ICD-10-CM | POA: Diagnosis not present

## 2019-10-30 DIAGNOSIS — R2689 Other abnormalities of gait and mobility: Secondary | ICD-10-CM | POA: Diagnosis not present

## 2019-10-30 DIAGNOSIS — M6281 Muscle weakness (generalized): Secondary | ICD-10-CM

## 2019-10-30 NOTE — Therapy (Signed)
[facility] [address] [place], [place], [address] Phone: [phone]   Fax:  [phone]  Physical Therapy Treatment  Patient Details  Name: Cassandra Andrade MRN: [id number] Date of Birth: [birth year] Referring Provider (PT): Margette Fast, MD   Encounter Date: 10/30/2019   PT End of Session - 10/30/19 1539    Visit Number 9    Number of Visits 17    Date for PT Re-Evaluation 10/28/19    Authorization Type Medicare, Medicaid, Tricare    PT Start Time 1532    PT Stop Time 1615    PT Time Calculation (min) 43 min    Equipment Utilized During Treatment Gait belt    Activity Tolerance Patient tolerated treatment well    Behavior During Therapy WFL for tasks assessed/performed           Past Medical History:  Diagnosis Date  . Anxiety   . Arthritis   . Cancer (HCC)    Osteosarcoma, right leg  . CVA (cerebrovascular accident due to intracerebral hemorrhage) ([place]) 2004  . Depression   . DVT (deep venous thrombosis) (HCC)    Left leg  . MRSA (methicillin resistant Staphylococcus aureus)   . 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 Seizures ([place])   . Thyroid disease   . 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 ABOVE KNEE LEG AMPUTATION Right    Osteosarcoma  . BRAIN SURGERY  2004   cerebral aneurysm  . FRACTURE SURGERY     Rt. femur, folllowed by MRSA  . TOTAL KNEE ARTHROPLASTY Right     There were no vitals filed for this visit.   Subjective Assessment - 10/30/19 1536    Subjective No new complaints. Has got the script for a new prosthesis. Has scheduled with Mortimer Fries at [employer]. Pt wanting to discuss types of prosthetics out there. Wants one that "is more like a normal knee".    Patient is accompained by: Family member   mom, [name]   Limitations Walking;House hold activities;Standing    How long can you stand  comfortably? approx 30 mins per pt report    How long can you walk comfortably? 15-20 mins per pt report    Patient Stated Goals To walk with a cane if possible and get my balance better.    Currently in Pain? No/denies                 [facility] Adult PT Treatment/Exercise - 10/30/19 1541      Transfers   Transfers Sit to Stand;Stand to Sit    Sit to Stand 5: Supervision    Stand to Sit 5: Supervision      Ambulation/Gait   Ambulation/Gait Yes    Ambulation/Gait Assistance 4: Min guard;4: Min assist    Ambulation/Gait Assistance Details tactile cues needed for right hip protraction. verbal cues for step placement, weight shifting onto prosthesis in stance phase of gait and to increase base of support wiht gait.     Ambulation Distance (Feet) 350 Feet   x1, plus around gym with session   Assistive device Rolling walker;Prosthesis    Gait Pattern Step-through pattern;Decreased stance time - right;Decreased step length - left;Decreased stride length;Decreased weight shift to right;Right circumduction;Lateral hip instability;Narrow base of support    Ambulation Surface Level;Indoor      Prosthetics   Prosthetic Care Comments  pt with multiple  questions on getting a new prosthesis and wanting one that is more "normal".  Disussed she will most likely get a socket revision due to it being ill fitting per her and mom. Disussed types of knee components out there and that she most likley will not qualify for the microprocessor knee. Pt is to further discuss this with her prosthetist at her appt on Monday 11/05/19.            Current prosthetic wear tolerance (days/week)  daily    Current prosthetic wear tolerance (#hours/day)  5 hours 2 a day    Residual limb condition  pt reports no skin issues    Donning Prosthesis Supervision    Doffing Prosthesis Supervision               Balance Exercises - 10/30/19 1601      Balance Exercises: Standing   Standing Eyes Opened Wide (BOA);Head  turns;Solid surface;Other reps (comment);30 secs;Limitations    Standing Eyes Opened Limitations on floor with feet hip width apart- EC no head movements, progressing toward EC head movements left<>right, up<>down and diagonals both ways for 10 reps each side. min guard to min assist for balance with posterior balance loss > anterior. no UE support with touch to sturdy surface needed at times.                PT Short Term Goals - 10/05/19 0830      PT SHORT TERM GOAL #1   Title Pt will be don/doff prosthesis at S level and tolerate wearing prosthesis 5 hours, 2 times per day without skin issues or discomfort.  (Target Date: 09/28/19)    Baseline 10/01/19: Doffing supervision level, donning S-MinA ( at times still needs A). Tolerate wearing prosthesis 3 hours, 2 times per day without skin issues or discomfort    Time --    Period --    Status Not Met    Target Date 09/28/19      PT SHORT TERM GOAL #2   Title Pt will be independent with sink exercises/other HEP (with S from mother as needed) in order to indicate improved functional mobility.    Baseline Met 10/01/19    Time --    Period --    Status Achieved      PT SHORT TERM GOAL #3   Title Pt will improve gait speed to >/=1.82 ft/sec with RW/prosthesis in order to indicate decreased fall risk.    Baseline 10/01/19: 1.45f/sec with RW/prosthesis.    Time --    Period --    Status Not Met      PT SHORT TERM GOAL #4   Title Pt will ambulate x 542 with RW in simulated household environment at S level in order to indicate safety in home.    Baseline Met with cues for safety 10/01/19.    Time --    Period --    Status Achieved      PT SHORT TERM GOAL #5   Title Pt will improve BERG balance test to >/=22/56 in order to indicate decreased fall risk.    Baseline 10/03/19: 28/56    Time --    Period --    Status Achieved             PT Long Term Goals - 10/26/19 1245      PT LONG TERM GOAL #1   Title Pt will be able to don/doff  prosthesis at S level and tolerate wear of prosthesis >  75% of awake hours without change in skin integrity or tenderness.  (Target Date: 10/28/19)    Baseline 10/26/19: missed 2 days this week, prior to that was wearing prosthesis daily, for most of awake hours. pt is self donning/doffing prothesis    Status Partially Met      PT LONG TERM GOAL #2   Title Pt will improve gait speed to >/=2.42 ft/sec with LRAD and prosthesis in order to indicate safer community ambulation.    Baseline 10/26/19: 0.71 ft/sec with RW/prosthesis    Time --    Period --    Status Not Met      PT LONG TERM GOAL #3   Title Pt will ambulate x 300' with LRAD at S level over indoor and unlevel paved outdoor surfaces in order to indicate safe home and limited community ambulation.    Baseline 10/27/19: not met to date with sessions, 142 feet today    Time --    Period --    Status Not Met      PT LONG TERM GOAL #4   Title Pt will improve BERG score to >/=26/56 in order to indicate decreased fall risk.    Baseline 10/27/19: 26/56 scored today    Time --    Period --    Status Achieved      PT LONG TERM GOAL #5   Title Standing balance with rolling walker and prosthesis reaching 10" anteriorly, across midline and to floor with S.    Baseline 10/26/19: not tested due to time constraints    Time --    Period --    Status Unable to assess                 Plan - 10/30/19 1540    Clinical Impression Statement Today's skilled session continued to focus on gait mechanics with prosthesis/walker and unsupported standing balance with up to min assist needed. Several times during session pt inquiring about new prosthesis and what to get. Referrenced her converstation with primary PT a few sessions ago on this same topic each time. The pt is progressing toward goals and should benefit from continued PT to progress toward unmet goals.    Personal Factors and Comorbidities Comorbidity 3+;Time since onset of  injury/illness/exacerbation    Comorbidities see above    Examination-Activity Limitations Bathing;Bend;Dressing;Hygiene/Grooming;Locomotion Level;Reach Overhead;Stairs;Stand;Transfers    Examination-Participation Restrictions Community Activity;Laundry;Shop;Meal Prep    Stability/Clinical Decision Making Evolving/Moderate complexity    Rehab Potential Good    PT Frequency 2x / week    PT Duration 8 weeks    PT Treatment/Interventions ADLs/Self Care Home Management;DME Instruction;Gait training;Stair training;Functional mobility training;Therapeutic activities;Therapeutic exercise;Balance training;Neuromuscular re-education;Cognitive remediation;Patient/family education;Prosthetic Training;Passive range of motion;Energy conservation;Vestibular    PT Next Visit Plan Primary PT to recert. 10th visit progress note next session. see how counter top exercises are going, educate on sock ply adjustment, balance training and Right hip strengthening. LLE NMR from previous stroke    PT Home Exercise Plan will plan to recert at end of this cert    Consulted and Agree with Plan of Care Patient;Family member/caregiver    Family Member Consulted mother           Patient will benefit from skilled therapeutic intervention in order to improve the following deficits and impairments:  Abnormal gait, Decreased activity tolerance, Decreased balance, Decreased cognition, Decreased endurance, Decreased knowledge of precautions, Decreased knowledge of use of DME, Decreased mobility, Decreased range of motion, Decreased safety awareness, Decreased strength,  Difficulty walking, Increased edema, Impaired perceived functional ability, Impaired flexibility, Postural dysfunction  Visit Diagnosis: Other abnormalities of gait and mobility  Muscle weakness (generalized)  Unsteadiness on feet     Problem List Patient Active Problem List   Diagnosis Date Noted  . History of deep venous thrombosis 05/05/2015  .  Personal history of osteosarcoma 05/05/2015  . Anemia 11/04/2014  . Abnormal finding on mammography 05/18/2014  . Major depression, recurrent ([place]) 03/19/2014  . Vision disturbance 02/28/2014  . Postprocedural state 12/31/2013  . History of biliary T-tube placement 12/31/2013  . Lupus anticoagulant disorder ([place]) 12/20/2013  . Back pain, chronic 11/07/2013  . Absence of bladder continence 08/05/2013  . History of anticoagulant therapy 07/17/2013  . Long term current use of anticoagulant therapy 07/17/2013  . Partial epilepsy with impairment of consciousness ([place]) 06/21/2013  . Intractable epilepsy ([place]) 02/28/2013  . Absolute anemia 02/28/2013  . Seizures ([place]) 02/21/2013  . Seizure ([place]) 02/17/2013  . DVT, bilateral lower limbs ([place]) 11/07/2012  . Hypothyroid 07/04/2012  . Seizure disorder ([place]) 07/04/2012  . Chronic pain syndrome 07/04/2012  . S/P BKA (below knee amputation) ([place]) 07/04/2012  . Chronic pain associated with significant psychosocial dysfunction 07/04/2012  . Cerebrovascular accident, late effects 06/05/2012  . Status post above knee amputation 06/08/2011  . Mechanical complication of graft of bone, cartilage, muscle or tendon 05/10/2011  . Depression 05/03/2011  . 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

## 2019-10-31 DIAGNOSIS — Z86718 Personal history of other venous thrombosis and embolism: Secondary | ICD-10-CM | POA: Diagnosis not present

## 2019-11-01 ENCOUNTER — Encounter: Payer: Self-pay | Admitting: Physical Therapy

## 2019-11-01 ENCOUNTER — Ambulatory Visit: Payer: Medicare Other | Admitting: Physical Therapy

## 2019-11-01 ENCOUNTER — Other Ambulatory Visit: Payer: Self-pay

## 2019-11-01 DIAGNOSIS — M6281 Muscle weakness (generalized): Secondary | ICD-10-CM

## 2019-11-01 DIAGNOSIS — R2681 Unsteadiness on feet: Secondary | ICD-10-CM | POA: Diagnosis not present

## 2019-11-01 DIAGNOSIS — R2689 Other abnormalities of gait and mobility: Secondary | ICD-10-CM

## 2019-11-02 ENCOUNTER — Encounter: Payer: Medicare Other | Admitting: Physical Therapy

## 2019-11-02 NOTE — Therapy (Addendum)
[facility] [address] [place], [place], [address] Phone: [phone]   Fax:  [phone]  Physical Therapy Treatment  Patient Details  Name: Cassandra Andrade MRN: [id number] Date of Birth: Nov 01, 1965 Referring Provider (PT): Margette Fast, MD   Encounter Date: 11/01/2019   PT End of Session - 11/01/19 1325    Visit Number 10    Number of Visits 17    Date for PT Re-Evaluation 10/28/19    Authorization Type Medicare, Medicaid, Tricare    PT Start Time 6503    PT Stop Time 1400    PT Time Calculation (min) 43 min    Equipment Utilized During Treatment Gait belt    Activity Tolerance Patient tolerated treatment well    Behavior During Therapy WFL for tasks assessed/performed           Past Medical History:  Diagnosis Date  . Anxiety   . Arthritis   . Cancer (HCC)    Osteosarcoma, right leg  . CVA (cerebrovascular accident due to intracerebral hemorrhage) ([place]) 2004  . Depression   . DVT (deep venous thrombosis) (HCC)    Left leg  . MRSA (methicillin resistant Staphylococcus aureus)   . 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 Seizures ([place])   . Thyroid disease   . 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 ABOVE KNEE LEG AMPUTATION Right    Osteosarcoma  . BRAIN SURGERY  2004   cerebral aneurysm  . FRACTURE SURGERY     Rt. femur, folllowed by MRSA  . TOTAL KNEE ARTHROPLASTY Right     There were no vitals filed for this visit.   Subjective Assessment - 11/01/19 1323    Subjective No new complaints. Mom does report pt had a seizure this am, so is sleepy today. Pt asking again once in session about type of prosthesis she should get. Re-inforced prior conversations regarding this topic.    Patient is accompained by: Family member   mom, [name] in lobby   Limitations Walking;House hold activities;Standing    How  long can you stand comfortably? approx 30 mins per pt report    How long can you walk comfortably? 15-20 mins per pt report    Patient Stated Goals To walk with a cane if possible and get my balance better.    Currently in Pain? No/denies                11/01/19 1328  Transfers  Transfers Sit to Stand;Stand to Sit  Sit to Stand 5: Supervision  Stand to Sit 5: Supervision  Ambulation/Gait  Ambulation/Gait Yes  Ambulation/Gait Assistance 4: Min guard  Ambulation/Gait Assistance Details around gym with session with cues for hip position, step position and weight shifting  Assistive device Rolling walker;Prosthesis  Gait Pattern Step-through pattern;Decreased stance time - right;Decreased step length - left;Decreased stride length;Decreased weight shift to right;Right circumduction;Lateral hip instability;Narrow base of support  Ambulation Surface Level;Indoor  High Level Balance  High Level Balance Activities Side stepping  High Level Balance Comments in parallel bars for 4 laps each way with cues on posture, step placement, weight shifting and step placement.           Balance Exercises - 11/01/19 1336      Balance Exercises: Standing   Standing Eyes Opened Foam/compliant surface;5 reps;Limitations    Standing Eyes Opened Limitations on once inch  foam with wide base of support working on alteranting UE raises with assist/cues on correct stand position.     Standing Eyes Closed Wide (BOA);Solid surface;3 reps;30 secs;Limitations    Standing Eyes Closed Limitations on floor with UE progressing to no UE support- facilitation for correct pelvic position, shoulder position and forward gaze (pt with left gaze preference once right hip rectraction corrected for more protraction, then needing right shoulder retraction)       Partial Tandem Stance Eyes open;Intermittent upper extremity support;3 reps;30 secs;Limitations    Partial Tandem Stance Limitations 3 reps each foot forward, min  to mod assist with cues for left knee extension, weight shifting and posture/trunk position.                PT Short Term Goals - 10/05/19 0830      PT SHORT TERM GOAL #1   Title Pt will be don/doff prosthesis at S level and tolerate wearing prosthesis 5 hours, 2 times per day without skin issues or discomfort.  (Target Date: 09/28/19)    Baseline 10/01/19: Doffing supervision level, donning S-MinA ( at times still needs A). Tolerate wearing prosthesis 3 hours, 2 times per day without skin issues or discomfort    Time --    Period --    Status Not Met    Target Date 09/28/19      PT SHORT TERM GOAL #2   Title Pt will be independent with sink exercises/other HEP (with S from mother as needed) in order to indicate improved functional mobility.    Baseline Met 10/01/19    Time --    Period --    Status Achieved      PT SHORT TERM GOAL #3   Title Pt will improve gait speed to >/=1.82 ft/sec with RW/prosthesis in order to indicate decreased fall risk.    Baseline 10/01/19: 1.20ft/sec with RW/prosthesis.    Time --    Period --    Status Not Met      PT SHORT TERM GOAL #4   Title Pt will ambulate x 33' with RW in simulated household environment at S level in order to indicate safety in home.    Baseline Met with cues for safety 10/01/19.    Time --    Period --    Status Achieved      PT SHORT TERM GOAL #5   Title Pt will improve BERG balance test to >/=22/56 in order to indicate decreased fall risk.    Baseline 10/03/19: 28/56    Time --    Period --    Status Achieved             PT Long Term Goals - 10/26/19 1245      PT LONG TERM GOAL #1   Title Pt will be able to don/doff prosthesis at S level and tolerate wear of prosthesis >75% of awake hours without change in skin integrity or tenderness.  (Target Date: 10/28/19)    Baseline 10/26/19: missed 2 days this week, prior to that was wearing prosthesis daily, for most of awake hours. pt is self donning/doffing prothesis    Status  Partially Met      PT LONG TERM GOAL #2   Title Pt will improve gait speed to >/=2.42 ft/sec with LRAD and prosthesis in order to indicate safer community ambulation.    Baseline 10/26/19: 0.71 ft/sec with RW/prosthesis    Time --    Period --    Status Not  Met      PT LONG TERM GOAL #3   Title Pt will ambulate x 300' with LRAD at S level over indoor and unlevel paved outdoor surfaces in order to indicate safe home and limited community ambulation.    Baseline 10/27/19: not met to date with sessions, 142 feet today    Time --    Period --    Status Not Met      PT LONG TERM GOAL #4   Title Pt will improve BERG score to >/=26/56 in order to indicate decreased fall risk.    Baseline 10/27/19: 26/56 scored today    Time --    Period --    Status Achieved      PT LONG TERM GOAL #5   Title Standing balance with rolling walker and prosthesis reaching 10" anteriorly, across midline and to floor with S.    Baseline 10/26/19: not tested due to time constraints    Time --    Period --    Status Unable to assess                 Plan - 11/01/19 1326    Clinical Impression Statement Today's skilled session focused on balance training with prosthesis with decreased UE support. Pt with hesitancy to bear weight on left LE and demo's decreased ability to keep left knee straight with standing. Needs further work on this. The pt should benefit from continued PT to progress toward unmet goals.    Personal Factors and Comorbidities Comorbidity 3+;Time since onset of injury/illness/exacerbation    Comorbidities see above    Examination-Activity Limitations Bathing;Bend;Dressing;Hygiene/Grooming;Locomotion Level;Reach Overhead;Stairs;Stand;Transfers    Examination-Participation Restrictions Community Activity;Laundry;Shop;Meal Prep    Stability/Clinical Decision Making Evolving/Moderate complexity    Rehab Potential Good    PT Frequency 2x / week    PT Duration 8 weeks    PT Treatment/Interventions  ADLs/Self Care Home Management;DME Instruction;Gait training;Stair training;Functional mobility training;Therapeutic activities;Therapeutic exercise;Balance training;Neuromuscular re-education;Cognitive remediation;Patient/family education;Prosthetic Training;Passive range of motion;Energy conservation;Vestibular    PT Next Visit Plan continue with prosthetic education as indicated, balance training and Right hip strengthening. LLE NMR from previous stroke    PT Home Exercise Plan will plan to recert at end of this cert    Consulted and Agree with Plan of Care Patient;Family member/caregiver    Family Member Consulted mother           Patient will benefit from skilled therapeutic intervention in order to improve the following deficits and impairments:  Abnormal gait, Decreased activity tolerance, Decreased balance, Decreased cognition, Decreased endurance, Decreased knowledge of precautions, Decreased knowledge of use of DME, Decreased mobility, Decreased range of motion, Decreased safety awareness, Decreased strength, Difficulty walking, Increased edema, Impaired perceived functional ability, Impaired flexibility, Postural dysfunction  Visit Diagnosis: Other abnormalities of gait and mobility  Muscle weakness (generalized)  Unsteadiness on feet     Problem List Patient Active Problem List   Diagnosis Date Noted  . History of deep venous thrombosis 05/05/2015  . Personal history of osteosarcoma 05/05/2015  . Anemia 11/04/2014  . Abnormal finding on mammography 05/18/2014  . Major depression, recurrent ([place]) 03/19/2014  . Vision disturbance 02/28/2014  . Postprocedural state 12/31/2013  . History of biliary T-tube placement 12/31/2013  . Lupus anticoagulant disorder ([place]) 12/20/2013  . Back pain, chronic 11/07/2013  . Absence of bladder continence 08/05/2013  . History of anticoagulant therapy 07/17/2013  . Long term current use of anticoagulant therapy 07/17/2013  . Partial  epilepsy  with impairment of consciousness ([place]) 06/21/2013  . Intractable epilepsy ([place]) 02/28/2013  . Absolute anemia 02/28/2013  . Seizures ([place]) 02/21/2013  . Seizure ([place]) 02/17/2013  . DVT, bilateral lower limbs ([place]) 11/07/2012  . Hypothyroid 07/04/2012  . Seizure disorder ([place]) 07/04/2012  . Chronic pain syndrome 07/04/2012  . S/P BKA (below knee amputation) ([place]) 07/04/2012  . Chronic pain associated with significant psychosocial dysfunction 07/04/2012  . Cerebrovascular accident, late effects 06/05/2012  . Status post above knee amputation 06/08/2011  . Mechanical complication of graft of bone, cartilage, muscle or tendon 05/10/2011  . Depression 05/03/2011  . 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, [place], [place] [address] [phone] 11/05/19, 4:29 PM  Name: Lekisha Mcghee MRN: [id number] Date of Birth: [birth year]

## 2019-11-05 ENCOUNTER — Ambulatory Visit: Payer: Medicare Other | Admitting: Physical Therapy

## 2019-11-05 ENCOUNTER — Encounter: Payer: Medicare Other | Admitting: Rehabilitation

## 2019-11-06 ENCOUNTER — Encounter (HOSPITAL_COMMUNITY): Payer: Self-pay | Admitting: Psychiatry

## 2019-11-06 ENCOUNTER — Telehealth (INDEPENDENT_AMBULATORY_CARE_PROVIDER_SITE_OTHER): Payer: Medicare Other | Admitting: Psychiatry

## 2019-11-06 ENCOUNTER — Other Ambulatory Visit: Payer: Self-pay

## 2019-11-06 DIAGNOSIS — F419 Anxiety disorder, unspecified: Secondary | ICD-10-CM | POA: Diagnosis not present

## 2019-11-06 DIAGNOSIS — F321 Major depressive disorder, single episode, moderate: Secondary | ICD-10-CM

## 2019-11-06 MED ORDER — VENLAFAXINE HCL ER 150 MG PO CP24: 150.0000 mg | ORAL_CAPSULE | Freq: Every day | ORAL | 0 refills | Status: DC

## 2019-11-06 NOTE — Progress Notes (Signed)
Virtual Visit via Telephone Note  I connected with Cassandra Andrade on 11/06/19 at 11:00 AM EDT by telephone and verified that I am speaking with the correct person using two identifiers.  Location: Patient: home Provider: home office   I discussed the limitations, risks, security and privacy concerns of performing an evaluation and management service by telephone and the availability of in person appointments. I also discussed with the patient that there may be a patient responsible charge related to this service. The patient expressed understanding and agreed to proceed.   History of Present Illness: Patient is evaluated by phone session. Her mother is also present. She is doing well and currently feeling much better because she is started using prosthesis regularly. She did physical therapy and that give her confidence. She is also thinking about doing puzzles to help her thinking and feel that helps her attention and concentration. She enjoys the company of her niece who stays sometimes on the weekend. She also in touch with her kids and she was pleased that his son is grown out as a man. She denies any irritability, crying spells, feeling of hopelessness. She is pleased that she has less episodes of seizures and her seizure medicine is working. She feels more joy in the life. She is trying to be on social media and now she has a Teaching laboratory technician. She has no tremors shakes or any EPS. Her mother also reported improvement in her mood. She is in therapy with Janett Billow regularly. Her appetite is okay.  Past Psychiatric History: H/O inpatientin 2011 due to overdose on pain medication.No h/omania or psychosis. Tried Zoloft and Remeron but do not remember. Took Lexapro and Abilify but stopped due to lack of response. Prozac worked for a long time until it stopped working.   Psychiatric Specialty Exam: Physical Exam  Review of Systems  Weight 179 lb (81.2 kg).There is no height or weight on file to  calculate BMI.  General Appearance: NA  Eye Contact:  NA  Speech:  Normal Rate  Volume:  Normal  Mood:  Euthymic  Affect:  NA  Thought Process:  Goal Directed  Orientation:  Full (Time, Place, and Person)  Thought Content:  WDL  Suicidal Thoughts:  No  Homicidal Thoughts:  No  Memory:  Immediate;   Good Recent;   Fair Remote;   Fair  Judgement:  Intact  Insight:  Present  Psychomotor Activity:  NA  Concentration:  Concentration: Fair and Attention Span: Fair  Recall:  Good  Fund of Knowledge:  Good  Language:  Good  Akathisia:  No  Handed:  Right  AIMS (if indicated):     Assets:  Communication Skills Desire for Improvement Housing Resilience Social Support  ADL's:  Intact  Cognition:  WNL  Sleep:   ok      Assessment and Plan: Major depressive disorder, recurrent. Anxiety.  Patient is doing well on her current medication. She does not want to change medication since it is working. Continue Effexor 150 mg daily. Discussed medication side effects and benefits. Recommended to call us back if she has any question or any concern. I also suggested that she should sit outside and go with her mother for grocery store since she is using prosthesis more often. She had appointment to see Dr. [name] in few months. Encouraged to continue therapy with Janett Billow. Recommended to call us back if she has any question or any concern. Follow-up in 3 months.  Follow Up Instructions:    I discussed  the assessment and treatment plan with the patient. The patient was provided an opportunity to ask questions and all were answered. The patient agreed with the plan and demonstrated an understanding of the instructions.   The patient was advised to call back or seek an in-person evaluation if the symptoms worsen or if the condition fails to improve as anticipated.  I provided 23 minutes of non-face-to-face time during this encounter.   Kathlee Nations, MD

## 2019-11-07 ENCOUNTER — Ambulatory Visit: Payer: Medicare Other | Admitting: Physical Therapy

## 2019-11-07 ENCOUNTER — Encounter: Payer: Self-pay | Admitting: Physical Therapy

## 2019-11-07 ENCOUNTER — Other Ambulatory Visit: Payer: Self-pay

## 2019-11-07 DIAGNOSIS — R2689 Other abnormalities of gait and mobility: Secondary | ICD-10-CM

## 2019-11-07 DIAGNOSIS — R2681 Unsteadiness on feet: Secondary | ICD-10-CM

## 2019-11-07 DIAGNOSIS — M6281 Muscle weakness (generalized): Secondary | ICD-10-CM

## 2019-11-08 NOTE — Therapy (Signed)
[facility] [address] [place], [place], [address] Phone: [phone]   Fax:  [phone]  Physical Therapy Treatment  Patient Details  Name: Cassandra Andrade MRN: [id number] Date of Birth: 06/05/1965 Referring Provider (PT): Charles Willis, MD   Encounter Date: 11/07/2019   PT End of Session - 11/07/19 1406    Visit Number 11    Number of Visits 17    Date for PT Re-Evaluation 10/28/19    Authorization Type Medicare, Medicaid, Tricare    PT Start Time 1401    PT Stop Time 1442    PT Time Calculation (min) 41 min    Equipment Utilized During Treatment Gait belt    Activity Tolerance Patient tolerated treatment well    Behavior During Therapy WFL for tasks assessed/performed           Past Medical History:  Diagnosis Date  . Anxiety   . Arthritis   . Cancer (HCC)    Osteosarcoma, right leg  . CVA (cerebrovascular accident due to intracerebral hemorrhage) (HCC) 2004  . Depression   . DVT (deep venous thrombosis) (HCC)    Left leg  . MRSA (methicillin resistant Staphylococcus aureus)   . 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 Seizures (HCC)   . Thyroid disease   . 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 ABOVE KNEE LEG AMPUTATION Right    Osteosarcoma  . BRAIN SURGERY  2004   cerebral aneurysm  . FRACTURE SURGERY     Rt. femur, folllowed by MRSA  . TOTAL KNEE ARTHROPLASTY Right     There were no vitals filed for this visit.   Subjective Assessment - 11/07/19 1406    Patient is accompained by: Family member   mom in lobby   Limitations Walking;House hold activities;Standing    How long can you stand comfortably? approx 30 mins per pt report    Patient Stated Goals To walk with a cane if possible and get my balance better.    Currently in Pain? No/denies                [facility] Adult PT  Treatment/Exercise - 11/07/19 1407      Transfers   Transfers Sit to Stand;Stand to Sit    Sit to Stand 5: Supervision;With upper extremity assist;From bed;From chair/3-in-1    Stand to Sit 5: Supervision;With upper extremity assist;To bed;To chair/3-in-1      Ambulation/Gait   Ambulation/Gait Yes    Ambulation/Gait Assistance 4: Min guard;4: Min assist    Ambulation/Gait Assistance Details cues on posture, walker position with gait and step placement. assist needed for walker management and balance on outdoor paved sufaces.     Ambulation Distance (Feet) 300 Feet   x1 in/outdoors, 115 x1 indoors, plus around gym with session   Assistive device Rolling walker;Prosthesis    Gait Pattern Step-through pattern;Decreased stance time - right;Decreased step length - left;Decreased stride length;Decreased weight shift to right;Right circumduction;Lateral hip instability;Narrow base of support    Ambulation Surface Level;Indoor      High Level Balance   High Level Balance Activities Side stepping    High Level Balance Comments in parallel bars for 4 laps each way with cues on posture, step placement, weight shifting and step placement.       Neuro Re-ed    Neuro Re-ed Details  for balance/proprioception: static standing with   feet hip width apart for EC no head movements, no UE support, 30 sec's x 3 reps. min assist cue to increased postural sway with cues to keep left knee straight needed.       Prosthetics   Prosthetic Care Comments  pt see[facility] [employer] on Friday for consult for possible socket revision. Pt again asking about different prosthetic wanting one that "is more like a real leg". Re-visited conversations from previous sessions that she most likely would not quailfy for a hydraulic knee and most likely would get a socket revision only. She is to further discuss this with the prosthetist on Friday.     Current prosthetic wear tolerance (days/week)  daily    Current prosthetic wear tolerance  (#hours/day)  5 hours 2 a day per pt report, mom not present to confirm    Residual limb condition  pt reports no skin issues    Donning Prosthesis Supervision    Doffing Prosthesis Supervision                    PT Short Term Goals - 10/05/19 0830      PT SHORT TERM GOAL #1   Title Pt will be don/doff prosthesis at S level and tolerate wearing prosthesis 5 hours, 2 times per day without skin issues or discomfort.  (Target Date: 09/28/19)    Baseline 10/01/19: Doffing supervision level, donning S-MinA ( at times still needs A). Tolerate wearing prosthesis 3 hours, 2 times per day without skin issues or discomfort    Time --    Period --    Status Not Met    Target Date 09/28/19      PT SHORT TERM GOAL #2   Title Pt will be independent with sink exercises/other HEP (with S from mother as needed) in order to indicate improved functional mobility.    Baseline Met 10/01/19    Time --    Period --    Status Achieved      PT SHORT TERM GOAL #3   Title Pt will improve gait speed to >/=1.82 ft/sec with RW/prosthesis in order to indicate decreased fall risk.    Baseline 10/01/19: 1.45ft/sec with RW/prosthesis.    Time --    Period --    Status Not Met      PT SHORT TERM GOAL #4   Title Pt will ambulate x 50' with RW in simulated household environment at S level in order to indicate safety in home.    Baseline Met with cues for safety 10/01/19.    Time --    Period --    Status Achieved      PT SHORT TERM GOAL #5   Title Pt will improve BERG balance test to >/=22/56 in order to indicate decreased fall risk.    Baseline 10/03/19: 28/56    Time --    Period --    Status Achieved             PT Long Term Goals - 10/26/19 1245      PT LONG TERM GOAL #1   Title Pt will be able to don/doff prosthesis at S level and tolerate wear of prosthesis >75% of awake hours without change in skin integrity or tenderness.  (Target Date: 10/28/19)    Baseline 10/26/19: missed 2 days this week,  prior to that was wearing prosthesis daily, for most of awake hours. pt is self donning/doffing prothesis    Status Partially Met        PT LONG TERM GOAL #2   Title Pt will improve gait speed to >/=2.42 ft/sec with LRAD and prosthesis in order to indicate safer community ambulation.    Baseline 10/26/19: 0.71 ft/sec with RW/prosthesis    Time --    Period --    Status Not Met      PT LONG TERM GOAL #3   Title Pt will ambulate x 300' with LRAD at S level over indoor and unlevel paved outdoor surfaces in order to indicate safe home and limited community ambulation.    Baseline 10/27/19: not met to date with sessions, 142 feet today    Time --    Period --    Status Not Met      PT LONG TERM GOAL #4   Title Pt will improve BERG score to >/=26/56 in order to indicate decreased fall risk.    Baseline 10/27/19: 26/56 scored today    Time --    Period --    Status Achieved      PT LONG TERM GOAL #5   Title Standing balance with rolling walker and prosthesis reaching 10" anteriorly, across midline and to floor with S.    Baseline 10/26/19: not tested due to time constraints    Time --    Period --    Status Unable to assess                 Plan - 11/07/19 1406    Clinical Impression Statement Today's skilled session focused on gait on various surfaces with RW/prosthesis and balance reactions with rest breaks taken as needed. Pt needed up to min assist for balance on outdoor paved surfaces, including assist with walker at times when it would get caught on the contraction lines on the sidewalk. Pt continues to need assist for standing balance without UE support as well. The pt should benefit from continued PT to progress toward unmet goals.    Personal Factors and Comorbidities Comorbidity 3+;Time since onset of injury/illness/exacerbation    Comorbidities see above    Examination-Activity Limitations Bathing;Bend;Dressing;Hygiene/Grooming;Locomotion Level;Reach  Overhead;Stairs;Stand;Transfers    Examination-Participation Restrictions Community Activity;Laundry;Shop;Meal Prep    Stability/Clinical Decision Making Evolving/Moderate complexity    Rehab Potential Good    PT Frequency 2x / week    PT Duration 8 weeks    PT Treatment/Interventions ADLs/Self Care Home Management;DME Instruction;Gait training;Stair training;Functional mobility training;Therapeutic activities;Therapeutic exercise;Balance training;Neuromuscular re-education;Cognitive remediation;Patient/family education;Prosthetic Training;Passive range of motion;Energy conservation;Vestibular    PT Next Visit Plan continue with prosthetic education as indicated, balance training and Right hip strengthening. LLE NMR from previous stroke    PT Home Exercise Plan will plan to recert at end of this cert    Consulted and Agree with Plan of Care Patient;Family member/caregiver    Family Member Consulted mother           Patient will benefit from skilled therapeutic intervention in order to improve the following deficits and impairments:  Abnormal gait, Decreased activity tolerance, Decreased balance, Decreased cognition, Decreased endurance, Decreased knowledge of precautions, Decreased knowledge of use of DME, Decreased mobility, Decreased range of motion, Decreased safety awareness, Decreased strength, Difficulty walking, Increased edema, Impaired perceived functional ability, Impaired flexibility, Postural dysfunction  Visit Diagnosis: Other abnormalities of gait and mobility  Muscle weakness (generalized)  Unsteadiness on feet     Problem List Patient Active Problem List   Diagnosis Date Noted  . History of deep venous thrombosis 05/05/2015  . Personal history of osteosarcoma 05/05/2015  .   Anemia 11/04/2014  . Abnormal finding on mammography 05/18/2014  . Major depression, recurrent ([place]) 03/19/2014  . Vision disturbance 02/28/2014  . Postprocedural state 12/31/2013  . History of  biliary T-tube placement 12/31/2013  . Lupus anticoagulant disorder ([place]) 12/20/2013  . Back pain, chronic 11/07/2013  . Absence of bladder continence 08/05/2013  . History of anticoagulant therapy 07/17/2013  . Long term current use of anticoagulant therapy 07/17/2013  . Partial epilepsy with impairment of consciousness ([place]) 06/21/2013  . Intractable epilepsy ([place]) 02/28/2013  . Absolute anemia 02/28/2013  . Seizures ([place]) 02/21/2013  . Seizure ([place]) 02/17/2013  . DVT, bilateral lower limbs ([place]) 11/07/2012  . Hypothyroid 07/04/2012  . Seizure disorder ([place]) 07/04/2012  . Chronic pain syndrome 07/04/2012  . S/P BKA (below knee amputation) ([place]) 07/04/2012  . Chronic pain associated with significant psychosocial dysfunction 07/04/2012  . Cerebrovascular accident, late effects 06/05/2012  . Status post above knee amputation 06/08/2011  . Mechanical complication of graft of bone, cartilage, muscle or tendon 05/10/2011  . Depression 05/03/2011  . 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

## 2019-11-12 ENCOUNTER — Ambulatory Visit: Payer: Medicare Other | Admitting: Physical Therapy

## 2019-11-14 ENCOUNTER — Ambulatory Visit (INDEPENDENT_AMBULATORY_CARE_PROVIDER_SITE_OTHER): Payer: Medicare Other | Admitting: Licensed Clinical Social Worker

## 2019-11-14 ENCOUNTER — Ambulatory Visit: Payer: Medicare Other | Admitting: Rehabilitation

## 2019-11-14 ENCOUNTER — Other Ambulatory Visit: Payer: Self-pay

## 2019-11-14 ENCOUNTER — Encounter (HOSPITAL_COMMUNITY): Payer: Self-pay | Admitting: Licensed Clinical Social Worker

## 2019-11-14 DIAGNOSIS — F330 Major depressive disorder, recurrent, mild: Secondary | ICD-10-CM | POA: Diagnosis not present

## 2019-11-14 NOTE — Addendum Note (Signed)
Addended by: Cameron Sprang A on: 11/14/2019 02:54 PM   Modules accepted: Orders

## 2019-11-14 NOTE — Progress Notes (Signed)
Virtual Visit via Video Note  I connected with Cassandra Andrade on 11/14/19 at  1:30 PM EDT by a video enabled telemedicine application and verified that I am speaking with the correct person using two identifiers.  Location: Patient: home Provider: office   I discussed the limitations of evaluation and management by telemedicine and the availability of in person appointments. The patient expressed understanding and agreed to proceed.  Type of Therapy: Individual Therapy   Treatment Goals addressed: improve psychiatric symptoms, elevate mood, Improve unhelpful thought patterns, interpersonal relationship skills, learn about diagnosis, healthy coping skills   Interventions: Motivational Interviewing   Summary: Cassandra Andrade is a 54 y.o. female who presents with Major Depressive Disorder, recurrent, mild   Suicidal/Homicidal: No -without intent/plan   Therapist Response: Cassandra Andrade met with clinician for an individual session. Cassandra Andrade discussed her psychiatric symptoms, her current life events and her homework. Cassandra Andrade reports that things are going well. She is spending time with her mother, her nieces, and got some time with her son. Clinician processed through Cassandra Andrade's experience as a mother and gave time for Cassandra Andrade to tell the story of adopting her son. Clinician reflected the excitement and joy of being a parent. Clinician also noted the importance of adoption to build families. Clinician explored physical health and noted ongoing motivation to walk.    Plan: Return again in 4 weeks.   Diagnosis: Axis I: Major Depressive Disorder, recurrent, mild     I discussed the assessment and treatment plan with the patient. The patient was provided an opportunity to ask questions and all were answered. The patient agreed with the plan and demonstrated an understanding of the instructions.   The patient was advised to call back or seek an in-person evaluation if the symptoms worsen or if the condition fails  to improve as anticipated.  I provided 45 minutes of non-face-to-face time during this encounter.   Cassandra Curling, LCSW

## 2019-11-17 ENCOUNTER — Other Ambulatory Visit (HOSPITAL_COMMUNITY): Payer: Self-pay | Admitting: Psychiatry

## 2019-11-17 DIAGNOSIS — F419 Anxiety disorder, unspecified: Secondary | ICD-10-CM

## 2019-11-17 DIAGNOSIS — F321 Major depressive disorder, single episode, moderate: Secondary | ICD-10-CM

## 2019-11-19 ENCOUNTER — Encounter: Payer: Self-pay | Admitting: Physical Therapy

## 2019-11-19 ENCOUNTER — Other Ambulatory Visit: Payer: Self-pay

## 2019-11-19 ENCOUNTER — Ambulatory Visit: Payer: Medicare Other | Admitting: Physical Therapy

## 2019-11-19 DIAGNOSIS — R2689 Other abnormalities of gait and mobility: Secondary | ICD-10-CM

## 2019-11-19 DIAGNOSIS — R2681 Unsteadiness on feet: Secondary | ICD-10-CM | POA: Diagnosis not present

## 2019-11-19 DIAGNOSIS — M6281 Muscle weakness (generalized): Secondary | ICD-10-CM

## 2019-11-19 NOTE — Therapy (Signed)
[facility] [address] [place], [place], [address] Phone: [phone]   Fax:  [phone]  Physical Therapy Treatment  Patient Details  Name: Cassandra Andrade MRN: [id number] Date of Birth: 1965/12/28 Referring Provider (PT): Margette Fast, MD   Encounter Date: 11/19/2019   PT End of Session - 11/19/19 1839    Visit Number 12    Number of Visits 17    Date for PT Re-Evaluation 10/28/19    Authorization Type Medicare, Medicaid, Tricare    PT Start Time 1752    PT Stop Time 1830    PT Time Calculation (min) 38 min    Equipment Utilized During Treatment Gait belt    Activity Tolerance Patient tolerated treatment well    Behavior During Therapy [facility] for tasks assessed/performed           Past Medical History:  Diagnosis Date  . Anxiety   . Arthritis   . Cancer (HCC)    Osteosarcoma, right leg  . CVA (cerebrovascular accident due to intracerebral hemorrhage) ([place]) 2004  . Depression   . DVT (deep venous thrombosis) (HCC)    Left leg  . MRSA (methicillin resistant Staphylococcus aureus)   . 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 Seizures ([place])   . Thyroid disease   . 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 ABOVE KNEE LEG AMPUTATION Right    Osteosarcoma  . BRAIN SURGERY  2004   cerebral aneurysm  . FRACTURE SURGERY     Rt. femur, folllowed by MRSA  . TOTAL KNEE ARTHROPLASTY Right     There were no vitals filed for this visit.   Subjective Assessment - 11/19/19 1755    Subjective Pt reports no falls. Reports going to [employer] for adjustments.    Patient is accompained by: Family member   mom in lobby   Limitations Walking;House hold activities;Standing    How long can you stand comfortably? approx 30 mins per pt report    Patient Stated Goals To walk with a cane if possible and get my balance better.     Currently in Pain? No/denies                             [facility] Adult PT Treatment/Exercise - 11/19/19 0001      Transfers   Transfers Sit to Stand;Stand to Sit    Sit to Stand 6: Modified independent (Device/Increase time)    Stand to Sit 6: Modified independent (Device/Increase time)      Ambulation/Gait   Ambulation/Gait Yes    Ambulation/Gait Assistance 4: Min guard;5: Supervision    Ambulation/Gait Assistance Details multi modal cues for weight shifting onto R LE and pelvic movment, cues to increase width of steps, cues for visual scanning environment for outdoor gait, instruction on how to navigate uneven surfaces safely including inclines and curb.                                              Ambulation Distance (Feet) 200 Feet    Assistive device Rolling walker;Prosthesis    Gait Pattern Step-through pattern;Decreased stance time - right;Decreased step length - left;Decreased stride length;Decreased weight shift to right;Right circumduction;Lateral hip instability;Narrow base of  support    Ambulation Surface Outdoor;Paved;Gravel;Unlevel;Level;Indoor      Prosthetics   Prosthetic Care Comments  Pt reports having adjustment from [employer]    Current prosthetic wear tolerance (days/week)  daily    Current prosthetic wear tolerance (#hours/day)  all awake hrs    Residual limb condition  pt reports no skin issues    Donning Prosthesis Modified independent (device/increased time)   per pt report   Doffing Prosthesis Modified independent (device/increased time)               Balance Exercises - 11/19/19 0001      Balance Exercises: Standing   Other Standing Exercises standing without UE support working on lateral weight shifts increasing weight on R LE and bringing hip to neutral; progressing weight shifts onto RLE with LLE stepping using parallel bars for support                                                     PT Education - 11/19/19 1838     Education Details Encourage pt with supervision to practise walking more outside in the community and to keep working on balance exercises at home.    Person(s) Educated Parent(s);Patient    Methods Explanation;Demonstration;Verbal cues    Comprehension Need further instruction;Verbalized understanding            PT Short Term Goals - 11/14/19 1449      PT SHORT TERM GOAL #1   Title Pt will be don/doff prosthesis at mod I level and tolerate wearing prosthesis 6 hours, 2 times per day without skin issues or discomfort.  (Target Date: 11/25/19)    Baseline 10/01/19: Doffing supervision level, donning S-MinA ( at times still needs A). Tolerate wearing prosthesis 3 hours, 2 times per day without skin issues or discomfort    Status Revised    Target Date 09/28/19      PT SHORT TERM GOAL #2   Title Pt will be independent with sink exercises/other HEP (with S from mother as needed) in order to indicate improved functional mobility.    Baseline --    Status On-going      PT SHORT TERM GOAL #3   Title Pt will improve gait speed to >/=1.82 ft/sec with RW/prosthesis in order to indicate decreased fall risk.    Baseline 10/01/19: 1.27ft/sec with RW/prosthesis.    Status On-going      PT SHORT TERM GOAL #4   Title Pt will ambulate x 150' with RW in simulated household environment at S level in order to indicate safety in home.    Baseline Met with cues for safety 10/01/19.    Status Revised      PT SHORT TERM GOAL #5   Title Pt will improve BERG balance test to >/=29/56 in order to indicate decreased fall risk.    Baseline 10/03/19: 28/56    Time 4    Status Revised             PT Long Term Goals - 11/14/19 1448      PT LONG TERM GOAL #1   Title Pt will be able to don/doff prosthesis at S level and tolerate wear of prosthesis >90% of awake hours without change in skin integrity or tenderness.  (Target Date: 12/23/19)    Baseline 10/26/19: missed 2 days this  week, prior to that was wearing  prosthesis daily, for most of awake hours. pt is self donning/doffing prothesis    Time 8    Period Weeks    Status Revised      PT LONG TERM GOAL #2   Title Pt will improve gait speed to >/=2.42 ft/sec with LRAD and prosthesis in order to indicate safer community ambulation.    Baseline 10/26/19: 0.71 ft/sec with RW/prosthesis    Time 8    Status On-going      PT LONG TERM GOAL #3   Title Pt will ambulate x 300' with LRAD at S level over indoor and unlevel paved outdoor surfaces in order to indicate safe home and limited community ambulation.    Baseline 10/27/19: not met to date with sessions, 142 feet today    Status On-going      PT LONG TERM GOAL #4   Title Pt will improve BERG score to >/=32/56 in order to indicate decreased fall risk.    Baseline 10/27/19: 26/56 scored today    Status Revised      PT LONG TERM GOAL #5   Title Standing balance with rolling walker and prosthesis reaching 10" anteriorly, across midline and to floor with S.    Baseline 10/26/19: not tested due to time constraints    Status On-going                 Plan - 11/19/19 1840    Clinical Impression Statement Skilled session focused on standing balance and community type gait with walker and prosthesis.  Pt progressed today with outdoor gait requiring a little less assist and demonstrating increased awareness of safety issues especially with L peripheral visual deficit.  Pt continues to require cues for increasing weight shift onto RLE to get into/ through midline postion and requires UE support for balance.    Personal Factors and Comorbidities Comorbidity 3+;Time since onset of injury/illness/exacerbation    Comorbidities see above    Examination-Activity Limitations Bathing;Bend;Dressing;Hygiene/Grooming;Locomotion Level;Reach Overhead;Stairs;Stand;Transfers    Examination-Participation Restrictions Community Activity;Laundry;Shop;Meal Prep    Stability/Clinical Decision Making Evolving/Moderate  complexity    Rehab Potential Good    PT Frequency 2x / week    PT Duration 8 weeks    PT Treatment/Interventions ADLs/Self Care Home Management;DME Instruction;Gait training;Stair training;Functional mobility training;Therapeutic activities;Therapeutic exercise;Balance training;Neuromuscular re-education;Cognitive remediation;Patient/family education;Prosthetic Training;Passive range of motion;Energy conservation;Vestibular    PT Next Visit Plan continue with prosthetic education as indicated, balance training and Right hip strengthening. LLE NMR from previous stroke    PT Home Exercise Plan will plan to recert at end of this cert    Consulted and Agree with Plan of Care Patient;Family member/caregiver    Family Member Consulted mother           Patient will benefit from skilled therapeutic intervention in order to improve the following deficits and impairments:  Abnormal gait, Decreased activity tolerance, Decreased balance, Decreased cognition, Decreased endurance, Decreased knowledge of precautions, Decreased knowledge of use of DME, Decreased mobility, Decreased range of motion, Decreased safety awareness, Decreased strength, Difficulty walking, Increased edema, Impaired perceived functional ability, Impaired flexibility, Postural dysfunction  Visit Diagnosis: Other abnormalities of gait and mobility  Muscle weakness (generalized)  Unsteadiness on feet     Problem List Patient Active Problem List   Diagnosis Date Noted  . History of deep venous thrombosis 05/05/2015  . Personal history of osteosarcoma 05/05/2015  . Anemia 11/04/2014  . Abnormal finding on mammography 05/18/2014  . Major  depression, recurrent ([place]) 03/19/2014  . Vision disturbance 02/28/2014  . Postprocedural state 12/31/2013  . History of biliary T-tube placement 12/31/2013  . Lupus anticoagulant disorder ([place]) 12/20/2013  . Back pain, chronic 11/07/2013  . Absence of bladder continence 08/05/2013  .  History of anticoagulant therapy 07/17/2013  . Long term current use of anticoagulant therapy 07/17/2013  . Partial epilepsy with impairment of consciousness ([place]) 06/21/2013  . Intractable epilepsy ([place]) 02/28/2013  . Absolute anemia 02/28/2013  . Seizures ([place]) 02/21/2013  . Seizure ([place]) 02/17/2013  . DVT, bilateral lower limbs ([place]) 11/07/2012  . Hypothyroid 07/04/2012  . Seizure disorder ([place]) 07/04/2012  . Chronic pain syndrome 07/04/2012  . S/P BKA (below knee amputation) ([place]) 07/04/2012  . Chronic pain associated with significant psychosocial dysfunction 07/04/2012  . Cerebrovascular accident, late effects 06/05/2012  . Status post above knee amputation 06/08/2011  . Mechanical complication of graft of bone, cartilage, muscle or tendon 05/10/2011  . Depression 05/03/2011  . 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

## 2019-11-21 ENCOUNTER — Other Ambulatory Visit: Payer: Self-pay

## 2019-11-21 ENCOUNTER — Ambulatory Visit: Payer: Medicare Other | Admitting: Rehabilitation

## 2019-11-21 ENCOUNTER — Encounter: Payer: Self-pay | Admitting: Rehabilitation

## 2019-11-21 DIAGNOSIS — M6281 Muscle weakness (generalized): Secondary | ICD-10-CM

## 2019-11-21 DIAGNOSIS — R2689 Other abnormalities of gait and mobility: Secondary | ICD-10-CM | POA: Diagnosis not present

## 2019-11-21 DIAGNOSIS — R2681 Unsteadiness on feet: Secondary | ICD-10-CM

## 2019-11-21 NOTE — Therapy (Signed)
[facility] [address] [place], [place], [address] Phone: [phone]   Fax:  [phone]  Physical Therapy Treatment  Patient Details  Name: Cassandra Andrade MRN: [id number] Date of Birth: 12/03/1965 Referring Provider (PT): Stephanie Acre, MD   Encounter Date: 11/21/2019   PT End of Session - 11/21/19 1853    Visit Number 13    Number of Visits 25    Date for PT Re-Evaluation 12/23/19    Authorization Type Medicare, Medicaid, Tricare    PT Start Time 1706    PT Stop Time 1745    PT Time Calculation (min) 39 min    Equipment Utilized During Treatment Gait belt    Activity Tolerance Patient tolerated treatment well    Behavior During Therapy WFL for tasks assessed/performed           Past Medical History:  Diagnosis Date  . Anxiety   . Arthritis   . Cancer (HCC)    Osteosarcoma, right leg  . CVA (cerebrovascular accident due to intracerebral hemorrhage) (HCC) 2004  . Depression   . DVT (deep venous thrombosis) (HCC)    Left leg  . MRSA (methicillin resistant Staphylococcus aureus)   . 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 Seizures (HCC)   . Thyroid disease   . 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 ABOVE KNEE LEG AMPUTATION Right    Osteosarcoma  . BRAIN SURGERY  2004   cerebral aneurysm  . FRACTURE SURGERY     Rt. femur, folllowed by MRSA  . TOTAL KNEE ARTHROPLASTY Right     There were no vitals filed for this visit.   Subjective Assessment - 11/21/19 1710    Subjective Pt reports doing well, no changes, no pain.    Limitations Walking;House hold activities;Standing    How long can you walk comfortably? 15-20 mins per pt report    Patient Stated Goals To walk with a cane if possible and get my balance better.    Currently in Pain? No/denies                             [facility]  Adult PT Treatment/Exercise - 11/21/19 1722      Transfers   Transfers Sit to Stand;Stand to Sit    Sit to Stand 6: Modified independent (Device/Increase time)    Stand to Sit 6: Modified independent (Device/Increase time)      Ambulation/Gait   Ambulation/Gait Yes    Ambulation/Gait Assistance 5: Supervision    Ambulation/Gait Assistance Details Pt able to ambulate up to 200' throughout simulated household environment with RW without assist but does need cues for negotiating due to visual deficits.     Ambulation Distance (Feet) 200 Feet    Assistive device Rolling walker;Prosthesis    Gait Pattern Step-through pattern;Decreased stance time - right;Decreased step length - left;Decreased stride length;Decreased weight shift to right;Right circumduction;Lateral hip instability;Narrow base of support    Ambulation Surface Level;Indoor    Gait velocity 1.10 ft/sec       Standardized Balance Assessment   Standardized Balance Assessment Berg Balance Test      Berg Balance Test   Sit to Stand Able to stand  independently using hands    Standing Unsupported Able to stand 30 seconds unsupported    Sitting with Back Unsupported but Feet Supported  on Floor or Stool Able to sit safely and securely 2 minutes    Stand to Sit Controls descent by using hands    Transfers Able to transfer with verbal cueing and /or supervision    Standing Unsupported with Eyes Closed Needs help to keep from falling    Standing Ubsupported with Feet Together Needs help to attain position but able to stand for 30 seconds with feet together    From Standing, Reach Forward with Outstretched Arm Reaches forward but needs supervision    From Standing Position, Pick up Object from Floor Able to pick up shoe, needs supervision    From Standing Position, Turn to Look Behind Over each Shoulder Needs assist to keep from losing balance and falling    Turn 360 Degrees Needs assistance while turning    Standing Unsupported,  Alternately Place Feet on Step/Stool Needs assistance to keep from falling or unable to try    Standing Unsupported, One Foot in Front Needs help to step but can hold 15 seconds    Standing on One Leg Unable to try or needs assist to prevent fall    Total Score 20      Prosthetics   Prosthetic Care Comments  Pt reports that [employer] adjusted leg, she is unsure exactly what they did.      Current prosthetic wear tolerance (days/week)  daily    Current prosthetic wear tolerance (#hours/day)  all awake    Residual limb condition  pt reports no skin issues    Donning Prosthesis Modified independent (device/increased time)    Doffing Prosthesis Modified independent (device/increased time)                    PT Short Term Goals - 11/21/19 1710      PT SHORT TERM GOAL #1   Title Pt will be don/doff prosthesis at mod I level and tolerate wearing prosthesis 6 hours, 2 times per day without skin issues or discomfort.  (Target Date: 11/25/19)    Baseline 10/01/19: Doffing supervision level, donning S-MinA ( at times still needs A). Tolerate wearing prosthesis 3 hours, 2 times per day without skin issues or discomfort    Status Achieved      PT SHORT TERM GOAL #2   Title Pt will be independent with sink exercises/other HEP (with S from mother as needed) in order to indicate improved functional mobility.    Status Achieved      PT SHORT TERM GOAL #3   Title Pt will improve gait speed to >/=1.82 ft/sec with RW/prosthesis in order to indicate decreased fall risk.    Baseline 1.10 ft/sec with RW    Status Not Met      PT SHORT TERM GOAL #4   Title Pt will ambulate x 150' with RW in simulated household environment at S level in order to indicate safety in home.    Baseline met 9/29 at S level with min cues due to visual deficits    Status Achieved      PT SHORT TERM GOAL #5   Title Pt will improve BERG balance test to >/=29/56 in order to indicate decreased fall risk.    Baseline 9/29/  22/56    Time 4    Status Not Met             PT Long Term Goals - 11/14/19 1448      PT LONG TERM GOAL #1   Title Pt will be able to  don/doff prosthesis at S level and tolerate wear of prosthesis >90% of awake hours without change in skin integrity or tenderness.  (Target Date: 12/23/19)    Baseline 10/26/19: missed 2 days this week, prior to that was wearing prosthesis daily, for most of awake hours. pt is self donning/doffing prothesis    Time 8    Period Weeks    Status Revised      PT LONG TERM GOAL #2   Title Pt will improve gait speed to >/=2.42 ft/sec with LRAD and prosthesis in order to indicate safer community ambulation.    Baseline 10/26/19: 0.71 ft/sec with RW/prosthesis    Time 8    Status On-going      PT LONG TERM GOAL #3   Title Pt will ambulate x 300' with LRAD at S level over indoor and unlevel paved outdoor surfaces in order to indicate safe home and limited community ambulation.    Baseline 10/27/19: not met to date with sessions, 142 feet today    Status On-going      PT LONG TERM GOAL #4   Title Pt will improve BERG score to >/=32/56 in order to indicate decreased fall risk.    Baseline 10/27/19: 26/56 scored today    Status Revised      PT LONG TERM GOAL #5   Title Standing balance with rolling walker and prosthesis reaching 10" anteriorly, across midline and to floor with S.    Baseline 10/26/19: not tested due to time constraints    Status On-going                 Plan - 11/21/19 1854    Clinical Impression Statement Skilled session focused on assessment of STGs.  Pt has met 3/5 STGs, however has declined in BERG balance score and gait speed, remaining a high fall risk.  Pt continues to be limited by decreased cognition leading to decreased carryover from session to session.    Personal Factors and Comorbidities Comorbidity 3+;Time since onset of injury/illness/exacerbation    Comorbidities see above    Examination-Activity Limitations  Bathing;Bend;Dressing;Hygiene/Grooming;Locomotion Level;Reach Overhead;Stairs;Stand;Transfers    Examination-Participation Restrictions Community Activity;Laundry;Shop;Meal Prep    Stability/Clinical Decision Making Evolving/Moderate complexity    Rehab Potential Good    PT Frequency 2x / week    PT Duration 8 weeks    PT Treatment/Interventions ADLs/Self Care Home Management;DME Instruction;Gait training;Stair training;Functional mobility training;Therapeutic activities;Therapeutic exercise;Balance training;Neuromuscular re-education;Cognitive remediation;Patient/family education;Prosthetic Training;Passive range of motion;Energy conservation;Vestibular    PT Next Visit Plan continue with prosthetic education as indicated, balance training and Right hip strengthening. LLE NMR from previous stroke    PT Home Exercise Plan will plan to recert at end of this cert    Consulted and Agree with Plan of Care Patient;Family member/caregiver    Family Member Consulted mother           Patient will benefit from skilled therapeutic intervention in order to improve the following deficits and impairments:  Abnormal gait, Decreased activity tolerance, Decreased balance, Decreased cognition, Decreased endurance, Decreased knowledge of precautions, Decreased knowledge of use of DME, Decreased mobility, Decreased range of motion, Decreased safety awareness, Decreased strength, Difficulty walking, Increased edema, Impaired perceived functional ability, Impaired flexibility, Postural dysfunction  Visit Diagnosis: Other abnormalities of gait and mobility  Muscle weakness (generalized)  Unsteadiness on feet     Problem List Patient Active Problem List   Diagnosis Date Noted  . History of deep venous thrombosis 05/05/2015  . Personal history of osteosarcoma  05/05/2015  . Anemia 11/04/2014  . Abnormal finding on mammography 05/18/2014  . Major depression, recurrent ([place]) 03/19/2014  . Vision disturbance  02/28/2014  . Postprocedural state 12/31/2013  . History of biliary T-tube placement 12/31/2013  . Lupus anticoagulant disorder ([place]) 12/20/2013  . Back pain, chronic 11/07/2013  . Absence of bladder continence 08/05/2013  . History of anticoagulant therapy 07/17/2013  . Long term current use of anticoagulant therapy 07/17/2013  . Partial epilepsy with impairment of consciousness ([place]) 06/21/2013  . Intractable epilepsy ([place]) 02/28/2013  . Absolute anemia 02/28/2013  . Seizures ([place]) 02/21/2013  . Seizure ([place]) 02/17/2013  . DVT, bilateral lower limbs ([place]) 11/07/2012  . Hypothyroid 07/04/2012  . Seizure disorder ([place]) 07/04/2012  . Chronic pain syndrome 07/04/2012  . S/P BKA (below knee amputation) ([place]) 07/04/2012  . Chronic pain associated with significant psychosocial dysfunction 07/04/2012  . Cerebrovascular accident, late effects 06/05/2012  . Status post above knee amputation 06/08/2011  . Mechanical complication of graft of bone, cartilage, muscle or tendon 05/10/2011  . Depression 05/03/2011  . 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 [place], [place], [address] Phone: [phone]   Fax:  [phone] 11/21/19, 6:56 PM  Name: Cassandra Andrade MRN: [id number] Date of Birth: 1965/04/07

## 2019-11-26 ENCOUNTER — Ambulatory Visit: Payer: Medicare Other | Admitting: Physical Therapy

## 2019-11-28 ENCOUNTER — Other Ambulatory Visit: Payer: Self-pay

## 2019-11-28 ENCOUNTER — Encounter: Payer: Self-pay | Admitting: Rehabilitation

## 2019-11-28 ENCOUNTER — Ambulatory Visit: Payer: Medicare Other | Attending: Nurse Practitioner | Admitting: Rehabilitation

## 2019-11-28 DIAGNOSIS — M25651 Stiffness of right hip, not elsewhere classified: Secondary | ICD-10-CM | POA: Insufficient documentation

## 2019-11-28 DIAGNOSIS — M6281 Muscle weakness (generalized): Secondary | ICD-10-CM | POA: Diagnosis not present

## 2019-11-28 DIAGNOSIS — R2689 Other abnormalities of gait and mobility: Secondary | ICD-10-CM | POA: Diagnosis not present

## 2019-11-28 DIAGNOSIS — R2681 Unsteadiness on feet: Secondary | ICD-10-CM | POA: Insufficient documentation

## 2019-11-28 DIAGNOSIS — Z86718 Personal history of other venous thrombosis and embolism: Secondary | ICD-10-CM | POA: Diagnosis not present

## 2019-11-28 NOTE — Therapy (Signed)
[facility] [address] [place], [place], [address] Phone: [phone]   Fax:  [phone]  Physical Therapy Treatment  Patient Details  Name: Cassandra Andrade MRN: [id number] Date of Birth: 29-Mar-1965 Referring Provider (PT): Margette Fast, MD   Encounter Date: 11/28/2019   PT End of Session - 11/28/19 1737    Visit Number 14    Number of Visits 25    Date for PT Re-Evaluation 12/23/19    Authorization Type Medicare, Medicaid, Tricare    PT Start Time 1435    PT Stop Time 1520    PT Time Calculation (min) 45 min    Equipment Utilized During Treatment Gait belt    Activity Tolerance Patient tolerated treatment well    Behavior During Therapy [facility] for tasks assessed/performed           Past Medical History:  Diagnosis Date  . Anxiety   . Arthritis   . Cancer (HCC)    Osteosarcoma, right leg  . CVA (cerebrovascular accident due to intracerebral hemorrhage) ([place]) 2004  . Depression   . DVT (deep venous thrombosis) (HCC)    Left leg  . MRSA (methicillin resistant Staphylococcus aureus)   . 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 Seizures ([place])   . Thyroid disease   . 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 ABOVE KNEE LEG AMPUTATION Right    Osteosarcoma  . BRAIN SURGERY  2004   cerebral aneurysm  . FRACTURE SURGERY     Rt. femur, folllowed by MRSA  . TOTAL KNEE ARTHROPLASTY Right     There were no vitals filed for this visit.   Subjective Assessment - 11/28/19 1638    Subjective No changes.  Did have some chest pain Monday.  Has an appt with MD on Friday.  No further pain since then and went away in about 30 mins.    Patient is accompained by: Family member    Limitations Walking;House hold activities;Standing    Patient Stated Goals To walk with a cane if possible and get my balance better.    Currently  in Pain? No/denies                             [facility] Adult PT Treatment/Exercise - 11/28/19 1706      Ambulation/Gait   Ambulation/Gait Yes    Ambulation/Gait Assistance 5: Supervision;4: Min assist;3: Mod assist    Ambulation/Gait Assistance Details Pt ambulatory with RW throughout part of session at S level with min cues for forward gaze and light tactile facilitation at R hip for forward protraction over prosthesis in stance all the way to terminal stance so that prosthetic knee will flex for less forceful swing through.  Also cues for improved L LE activation in L stance phase of gait.  Then worked on gait at [employer] with RUE support and PT providing LUE support for forward and retro gait x 6 laps total with cues for improved weight shift bilaterally esp with retro gait.  Then finally attempted gait with quad tip cane at end of session with PT providing assist on opposite side more so to improve LE weight shift and activation as she requires more min/mod A to ambulate this way. Educated that she is NOT to use cane at home.  Pt also asking about treadmill at  home.  Again, PT educated that we would try treadmill here in session and that mother would need to be present before doing at home to determine if safe.  pt verbalized understanding.     Ambulation Distance (Feet) 150 Feet   then another 65' x 1, 30' x 1    Assistive device Rolling walker;Straight cane;Prosthesis   with quad tip    Gait Pattern Step-through pattern;Decreased stance time - right;Decreased step length - left;Decreased stride length;Decreased weight shift to right;Right circumduction;Lateral hip instability;Narrow base of support    Ambulation Surface Level;Indoor      High Level Balance   High Level Balance Comments At counter top worked on dynamic standing balance without (or intermittent as needed) UE support with reaching across body for targets and placing them far to opposite side.  Pt needs tactile  cues for adequate weight shift to both sides, but did demo improvement by end of task.  then had pt stand in front of sink for better visualization of midline.  Had her reach laterally and upward on counter top x 10 reps each direction, again with min facilitation for adequate weight shift.  Had pt tap LLE to inside of bottom cabinet x 10 reps (with UE support) for improved R lateral weight shift.  Pt tolerated well.  Pt remained standing throughout most of session today with 2 seated rest breaks throughout.        Prosthetics   Current prosthetic wear tolerance (days/week)  daily    Current prosthetic wear tolerance (#hours/day)  all awake    Current prosthetic weight-bearing tolerance (hours/day)  walked Friday, Sat, Sun outdoors with mother    Residual limb condition  pt reports no skin issues    Donning Prosthesis Modified independent (device/increased time)    Doffing Prosthesis Modified independent (device/increased time)                    PT Short Term Goals - 11/21/19 1710      PT SHORT TERM GOAL #1   Title Pt will be don/doff prosthesis at mod I level and tolerate wearing prosthesis 6 hours, 2 times per day without skin issues or discomfort.  (Target Date: 11/25/19)    Baseline 10/01/19: Doffing supervision level, donning S-MinA ( at times still needs A). Tolerate wearing prosthesis 3 hours, 2 times per day without skin issues or discomfort    Status Achieved      PT SHORT TERM GOAL #2   Title Pt will be independent with sink exercises/other HEP (with S from mother as needed) in order to indicate improved functional mobility.    Status Achieved      PT SHORT TERM GOAL #3   Title Pt will improve gait speed to >/=1.82 ft/sec with RW/prosthesis in order to indicate decreased fall risk.    Baseline 1.10 ft/sec with RW    Status Not Met      PT SHORT TERM GOAL #4   Title Pt will ambulate x 150' with RW in simulated household environment at S level in order to indicate safety  in home.    Baseline met 9/29 at S level with min cues due to visual deficits    Status Achieved      PT SHORT TERM GOAL #5   Title Pt will improve BERG balance test to >/=29/56 in order to indicate decreased fall risk.    Baseline 9/29/ 22/56    Time 4    Status Not Met  PT Long Term Goals - 11/14/19 1448      PT LONG TERM GOAL #1   Title Pt will be able to don/doff prosthesis at S level and tolerate wear of prosthesis >90% of awake hours without change in skin integrity or tenderness.  (Target Date: 12/23/19)    Baseline 10/26/19: missed 2 days this week, prior to that was wearing prosthesis daily, for most of awake hours. pt is self donning/doffing prothesis    Time 8    Period Weeks    Status Revised      PT LONG TERM GOAL #2   Title Pt will improve gait speed to >/=2.42 ft/sec with LRAD and prosthesis in order to indicate safer community ambulation.    Baseline 10/26/19: 0.71 ft/sec with RW/prosthesis    Time 8    Status On-going      PT LONG TERM GOAL #3   Title Pt will ambulate x 300' with LRAD at S level over indoor and unlevel paved outdoor surfaces in order to indicate safe home and limited community ambulation.    Baseline 10/27/19: not met to date with sessions, 142 feet today    Status On-going      PT LONG TERM GOAL #4   Title Pt will improve BERG score to >/=32/56 in order to indicate decreased fall risk.    Baseline 10/27/19: 26/56 scored today    Status Revised      PT LONG TERM GOAL #5   Title Standing balance with rolling walker and prosthesis reaching 10" anteriorly, across midline and to floor with S.    Baseline 10/26/19: not tested due to time constraints    Status On-going                 Plan - 11/28/19 1737    Clinical Impression Statement Skilled session focused on dynamic balance with decreasing UE support as well as gait with LRAD in order to have more automatic weight shift and activation of LEs.  Pt did demonstrate improved  activity tolerance today as she was able to remain standing throughout most of session.    Personal Factors and Comorbidities Comorbidity 3+;Time since onset of injury/illness/exacerbation    Comorbidities see above    Examination-Activity Limitations Bathing;Bend;Dressing;Hygiene/Grooming;Locomotion Level;Reach Overhead;Stairs;Stand;Transfers    Examination-Participation Restrictions Community Activity;Laundry;Shop;Meal Prep    Stability/Clinical Decision Making Evolving/Moderate complexity    Rehab Potential Good    PT Frequency 2x / week    PT Duration 8 weeks    PT Treatment/Interventions ADLs/Self Care Home Management;DME Instruction;Gait training;Stair training;Functional mobility training;Therapeutic activities;Therapeutic exercise;Balance training;Neuromuscular re-education;Cognitive remediation;Patient/family education;Prosthetic Training;Passive range of motion;Energy conservation;Vestibular    PT Next Visit Plan continue with prosthetic education as indicated, balance training without/limited UE support and Right hip strengthening. LLE NMR from previous stroke    Recommended Other [place], she wants to get on the treadmill.  I am not sure how that would look at home, but we could try it here VERY slowly and see??    Consulted and Agree with Plan of Care Patient;Family member/caregiver    Family Member Consulted mother           Patient will benefit from skilled therapeutic intervention in order to improve the following deficits and impairments:  Abnormal gait, Decreased activity tolerance, Decreased balance, Decreased cognition, Decreased endurance, Decreased knowledge of precautions, Decreased knowledge of use of DME, Decreased mobility, Decreased range of motion, Decreased safety awareness, Decreased strength, Difficulty walking, Increased edema, Impaired perceived functional ability, Impaired  flexibility, Postural dysfunction  Visit Diagnosis: Other abnormalities of gait  and mobility  Muscle weakness (generalized)  Unsteadiness on feet     Problem List Patient Active Problem List   Diagnosis Date Noted  . History of deep venous thrombosis 05/05/2015  . Personal history of osteosarcoma 05/05/2015  . Anemia 11/04/2014  . Abnormal finding on mammography 05/18/2014  . Major depression, recurrent ([place]) 03/19/2014  . Vision disturbance 02/28/2014  . Postprocedural state 12/31/2013  . History of biliary T-tube placement 12/31/2013  . Lupus anticoagulant disorder ([place]) 12/20/2013  . Back pain, chronic 11/07/2013  . Absence of bladder continence 08/05/2013  . History of anticoagulant therapy 07/17/2013  . Long term current use of anticoagulant therapy 07/17/2013  . Partial epilepsy with impairment of consciousness ([place]) 06/21/2013  . Intractable epilepsy ([place]) 02/28/2013  . Absolute anemia 02/28/2013  . Seizures ([place]) 02/21/2013  . Seizure ([place]) 02/17/2013  . DVT, bilateral lower limbs ([place]) 11/07/2012  . Hypothyroid 07/04/2012  . Seizure disorder ([place]) 07/04/2012  . Chronic pain syndrome 07/04/2012  . S/P BKA (below knee amputation) ([place]) 07/04/2012  . Chronic pain associated with significant psychosocial dysfunction 07/04/2012  . Cerebrovascular accident, late effects 06/05/2012  . Status post above knee amputation 06/08/2011  . Mechanical complication of graft of bone, cartilage, muscle or tendon 05/10/2011  . Depression 05/03/2011  . 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 [place], [place], [address] Phone: [phone]   Fax:  [phone] 11/28/19, 5:43 PM  Name: Cassandra Andrade MRN: [id number] Date of Birth: 20-Feb-1966

## 2019-11-30 DIAGNOSIS — R7301 Impaired fasting glucose: Secondary | ICD-10-CM | POA: Diagnosis not present

## 2019-11-30 DIAGNOSIS — Z23 Encounter for immunization: Secondary | ICD-10-CM | POA: Diagnosis not present

## 2019-11-30 DIAGNOSIS — R5383 Other fatigue: Secondary | ICD-10-CM | POA: Diagnosis not present

## 2019-11-30 DIAGNOSIS — G40909 Epilepsy, unspecified, not intractable, without status epilepticus: Secondary | ICD-10-CM | POA: Diagnosis not present

## 2019-11-30 DIAGNOSIS — Z1329 Encounter for screening for other suspected endocrine disorder: Secondary | ICD-10-CM | POA: Diagnosis not present

## 2019-12-03 ENCOUNTER — Ambulatory Visit: Payer: Medicare Other | Admitting: Rehabilitation

## 2019-12-05 ENCOUNTER — Ambulatory Visit: Payer: Medicare Other | Admitting: Rehabilitation

## 2019-12-12 ENCOUNTER — Ambulatory Visit: Payer: Medicare Other | Admitting: Rehabilitation

## 2019-12-19 ENCOUNTER — Encounter: Payer: Self-pay | Admitting: Rehabilitation

## 2019-12-19 ENCOUNTER — Other Ambulatory Visit: Payer: Self-pay

## 2019-12-19 ENCOUNTER — Ambulatory Visit: Payer: Medicare Other | Admitting: Rehabilitation

## 2019-12-19 DIAGNOSIS — M6281 Muscle weakness (generalized): Secondary | ICD-10-CM | POA: Diagnosis not present

## 2019-12-19 DIAGNOSIS — R2689 Other abnormalities of gait and mobility: Secondary | ICD-10-CM

## 2019-12-19 DIAGNOSIS — M25651 Stiffness of right hip, not elsewhere classified: Secondary | ICD-10-CM

## 2019-12-19 DIAGNOSIS — R2681 Unsteadiness on feet: Secondary | ICD-10-CM

## 2019-12-19 NOTE — Therapy (Signed)
[facility] [address] [place], [place], [address] Phone: [phone]   Fax:  [phone]  Physical Therapy Treatment and D/C Summary   Patient Details  Name: Cassandra Andrade MRN: [id number] Date of Birth: Nov 15, 1965 Referring Provider (PT): Margette Fast, MD   Encounter Date: 12/19/2019   PT End of Session - 12/19/19 1655    Visit Number 15    Number of Visits 25    Date for PT Re-Evaluation 12/23/19    Authorization Type Medicare, Medicaid, Tricare    PT Start Time 1445    PT Stop Time 1531    PT Time Calculation (min) 46 min    Equipment Utilized During Treatment Gait belt    Activity Tolerance Patient tolerated treatment well    Behavior During Therapy [facility] for tasks assessed/performed           Past Medical History:  Diagnosis Date  . Anxiety   . Arthritis   . Cancer (HCC)    Osteosarcoma, right leg  . CVA (cerebrovascular accident due to intracerebral hemorrhage) ([place]) 2004  . Depression   . DVT (deep venous thrombosis) (HCC)    Left leg  . MRSA (methicillin resistant Staphylococcus aureus)   . 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 Seizures ([place])   . Thyroid disease   . 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 ABOVE KNEE LEG AMPUTATION Right    Osteosarcoma  . BRAIN SURGERY  2004   cerebral aneurysm  . FRACTURE SURGERY     Rt. femur, folllowed by MRSA  . TOTAL KNEE ARTHROPLASTY Right     There were no vitals filed for this visit.   Subjective Assessment - 12/19/19 1649    Subjective Pt reports wanting to walk with cane today.  Educated why we did this last session and would practice at counter top to see if she can do at home.    Patient is accompained by: Family member    Limitations Walking;House hold activities;Standing    How long can you stand comfortably? approx 30 mins per pt report     How long can you walk comfortably? 15-20 mins per pt report    Patient Stated Goals To walk with a cane if possible and get my balance better.    Currently in Pain? No/denies                             [facility] Adult PT Treatment/Exercise - 12/19/19 1456      Transfers   Transfers Sit to Stand;Stand to Sit    Sit to Stand 6: Modified independent (Device/Increase time)    Stand to Sit 6: Modified independent (Device/Increase time)    Comments Mod I to RW, however S to min/guard without device.       Ambulation/Gait   Ambulation/Gait Yes    Ambulation/Gait Assistance 5: Supervision;4: Min guard    Ambulation/Gait Assistance Details Pt S for indoor and unlevel outdoor paved surfaces with RW today with min cues for safety when going uphilll/downhill and to increase attention to the L due to poor peripheral vision.  Did practice using quad tip cane in LUE and counter on the R with PT providing cues for sequencing with cane and min/guard for safety.  did discuss with pts mother that they could work on this at home (  they report they have small base quad cane.  PT reports that mother ensure all 4 leg are down when pt is stepping.  Both verbalize understanding.      Ambulation Distance (Feet) 350 Feet    Assistive device Rolling walker;Straight cane;Prosthesis    Gait Pattern Step-through pattern;Decreased stance time - right;Decreased step length - left;Decreased stride length;Decreased weight shift to right;Right circumduction;Lateral hip instability;Narrow base of support    Ambulation Surface Level;Indoor;Unlevel;Outdoor;Paved    Gait velocity 1.50 ft/sec with RW/prosthesis       Standardized Balance Assessment   Standardized Balance Assessment Berg Balance Test      Berg Balance Test   Sit to Stand Able to stand  independently using hands    Standing Unsupported Able to stand 2 minutes with supervision    Sitting with Back Unsupported but Feet Supported on Floor or Stool  Able to sit safely and securely 2 minutes    Stand to Sit Controls descent by using hands    Transfers Able to transfer with verbal cueing and /or supervision    Standing Unsupported with Eyes Closed Able to stand 10 seconds with supervision    Standing Ubsupported with Feet Together Needs help to attain position and unable to hold for 15 seconds    From Standing, Reach Forward with Outstretched Arm Loses balance while trying/requires external support    From Standing Position, Pick up Object from Floor Unable to pick up shoe, but reaches 2-5 cm (1-2") from shoe and balances independently    From Standing Position, Turn to Look Behind Over each Shoulder Looks behind one side only/other side shows less weight shift    Turn 360 Degrees Needs assistance while turning    Standing Unsupported, Alternately Place Feet on Step/Stool Needs assistance to keep from falling or unable to try    Standing Unsupported, One Foot in [employer] balance while stepping or standing    Standing on One Leg Unable to try or needs assist to prevent fall    Total Score 23      Prosthetics   Current prosthetic wear tolerance (days/week)  daily    Current prosthetic wear tolerance (#hours/day)  all awake    Current prosthetic weight-bearing tolerance (hours/day)  Continues to walk with mother outdoors     Edema pt with no edema but limb not shaped well with adipose tissue    Residual limb condition  pt reports no skin issues    Education Provided Other (comment)   returning to therapy in Jan for refresher, walking with cane   Person(s) Educated Patient;Parent(s)    Education Method Explanation;Demonstration    Education Method Verbalized understanding;Verbal cues required    [employer] Prosthesis Supervision    Doffing Prosthesis Modified independent (device/increased time)                    PT Short Term Goals - 11/21/19 1710      PT SHORT TERM GOAL #1   Title Pt will be don/doff prosthesis at mod I  level and tolerate wearing prosthesis 6 hours, 2 times per day without skin issues or discomfort.  (Target Date: 11/25/19)    Baseline 10/01/19: Doffing supervision level, donning S-MinA ( at times still needs A). Tolerate wearing prosthesis 3 hours, 2 times per day without skin issues or discomfort    Status Achieved      PT SHORT TERM GOAL #2   Title Pt will be independent with sink exercises/other HEP (with S  from mother as needed) in order to indicate improved functional mobility.    Status Achieved      PT SHORT TERM GOAL #3   Title Pt will improve gait speed to >/=1.82 ft/sec with RW/prosthesis in order to indicate decreased fall risk.    Baseline 1.10 ft/sec with RW    Status Not Met      PT SHORT TERM GOAL #4   Title Pt will ambulate x 150' with RW in simulated household environment at S level in order to indicate safety in home.    Baseline met 9/29 at S level with min cues due to visual deficits    Status Achieved      PT SHORT TERM GOAL #5   Title Pt will improve BERG balance test to >/=29/56 in order to indicate decreased fall risk.    Baseline 9/29/ 22/56    Time 4    Status Not Met             PT Long Term Goals - 12/19/19 1449      PT LONG TERM GOAL #1   Title Pt will be able to don/doff prosthesis at S level and tolerate wear of prosthesis >90% of awake hours without change in skin integrity or tenderness.  (Target Date: 12/23/19)    Baseline met per pt report 12/19/19    Time 8    Period Weeks    Status Achieved      PT LONG TERM GOAL #2   Title Pt will improve gait speed to >/=2.42 ft/sec with LRAD and prosthesis in order to indicate safer community ambulation.    Baseline 1.50 ft/sec with RW, improved but not to goal level    Time 8    Status Partially Met      PT LONG TERM GOAL #3   Title Pt will ambulate x 300' with LRAD at S level over indoor and unlevel paved outdoor surfaces in order to indicate safe home and limited community ambulation.    Baseline  met 12/19/19    Status Achieved      PT LONG TERM GOAL #4   Title Pt will improve BERG score to >/=32/56 in order to indicate decreased fall risk.    Baseline 23/56 on 12/19/19    Status Not Met      PT LONG TERM GOAL #5   Title Standing balance with rolling walker and prosthesis reaching 10" anteriorly, across midline and to floor with S.    Baseline Needs min A for balance    Status Not Met             PHYSICAL THERAPY DISCHARGE SUMMARY  Visits from Start of Care: 15  Current functional level related to goals / functional outcomes: See LTGs above   Remaining deficits: Pt continues to have balance and endurance along with strength deficits.  However a limiting factor in her progress in decreased cognition and vision deficits.    Education / Equipment: HEP   Plan: Patient agrees to discharge.  Patient goals were partially met. Patient is being discharged due to meeting the stated rehab goals.  ?????          Plan - 12/19/19 1655    Clinical Impression Statement Skilled session focused on assessment of LTGs as pt is beginning to plateau in function and reports busy home life at this time.  She has met 2/5 LTGs, partially meeting goal for gait speed.  Discussed that she can ambulate along counter  with cane and mother at home and continue HEP with plans to return to OP in the new year. Pt and mother verbalize understanding.    Personal Factors and Comorbidities Comorbidity 3+;Time since onset of injury/illness/exacerbation    Comorbidities see above    Examination-Activity Limitations Bathing;Bend;Dressing;Hygiene/Grooming;Locomotion Level;Reach Overhead;Stairs;Stand;Transfers    Examination-Participation Restrictions Community Activity;Laundry;Shop;Meal Prep    Stability/Clinical Decision Making Evolving/Moderate complexity    Rehab Potential Good    PT Frequency 2x / week    PT Duration 8 weeks    PT Treatment/Interventions ADLs/Self Care Home Management;DME  Instruction;Gait training;Stair training;Functional mobility training;Therapeutic activities;Therapeutic exercise;Balance training;Neuromuscular re-education;Cognitive remediation;Patient/family education;Prosthetic Training;Passive range of motion;Energy conservation;Vestibular    Consulted and Agree with Plan of Care Patient;Family member/caregiver    Family Member Consulted mother           Patient will benefit from skilled therapeutic intervention in order to improve the following deficits and impairments:  Abnormal gait, Decreased activity tolerance, Decreased balance, Decreased cognition, Decreased endurance, Decreased knowledge of precautions, Decreased knowledge of use of DME, Decreased mobility, Decreased range of motion, Decreased safety awareness, Decreased strength, Difficulty walking, Increased edema, Impaired perceived functional ability, Impaired flexibility, Postural dysfunction  Visit Diagnosis: Other abnormalities of gait and mobility  Muscle weakness (generalized)  Unsteadiness on feet  Hip stiffness, right     Problem List Patient Active Problem List   Diagnosis Date Noted  . History of deep venous thrombosis 05/05/2015  . Personal history of osteosarcoma 05/05/2015  . Anemia 11/04/2014  . Abnormal finding on mammography 05/18/2014  . Major depression, recurrent ([place]) 03/19/2014  . Vision disturbance 02/28/2014  . Postprocedural state 12/31/2013  . History of biliary T-tube placement 12/31/2013  . Lupus anticoagulant disorder ([place]) 12/20/2013  . Back pain, chronic 11/07/2013  . Absence of bladder continence 08/05/2013  . History of anticoagulant therapy 07/17/2013  . Long term current use of anticoagulant therapy 07/17/2013  . Partial epilepsy with impairment of consciousness ([place]) 06/21/2013  . Intractable epilepsy ([place]) 02/28/2013  . Absolute anemia 02/28/2013  . Seizures ([place]) 02/21/2013  . Seizure ([place]) 02/17/2013  . DVT, bilateral lower limbs ([place])  11/07/2012  . Hypothyroid 07/04/2012  . Seizure disorder ([place]) 07/04/2012  . Chronic pain syndrome 07/04/2012  . S/P BKA (below knee amputation) ([place]) 07/04/2012  . Chronic pain associated with significant psychosocial dysfunction 07/04/2012  . Cerebrovascular accident, late effects 06/05/2012  . Status post above knee amputation 06/08/2011  . Mechanical complication of graft of bone, cartilage, muscle or tendon 05/10/2011  . Depression 05/03/2011  . 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

## 2019-12-24 ENCOUNTER — Ambulatory Visit: Payer: Medicare Other | Admitting: Physical Therapy

## 2019-12-25 ENCOUNTER — Other Ambulatory Visit: Payer: Self-pay | Admitting: Neurology

## 2019-12-25 ENCOUNTER — Other Ambulatory Visit (HOSPITAL_COMMUNITY): Payer: Self-pay | Admitting: Psychiatry

## 2019-12-25 DIAGNOSIS — F419 Anxiety disorder, unspecified: Secondary | ICD-10-CM

## 2019-12-25 DIAGNOSIS — F321 Major depressive disorder, single episode, moderate: Secondary | ICD-10-CM

## 2019-12-26 ENCOUNTER — Ambulatory Visit: Payer: Medicare Other | Admitting: Rehabilitation

## 2019-12-26 DIAGNOSIS — Z86718 Personal history of other venous thrombosis and embolism: Secondary | ICD-10-CM | POA: Diagnosis not present

## 2019-12-31 ENCOUNTER — Encounter: Payer: Medicare Other | Admitting: Physical Therapy

## 2020-01-01 ENCOUNTER — Encounter (HOSPITAL_COMMUNITY): Payer: Self-pay | Admitting: Licensed Clinical Social Worker

## 2020-01-01 ENCOUNTER — Ambulatory Visit (INDEPENDENT_AMBULATORY_CARE_PROVIDER_SITE_OTHER): Payer: Medicare Other | Admitting: Licensed Clinical Social Worker

## 2020-01-01 ENCOUNTER — Other Ambulatory Visit: Payer: Self-pay

## 2020-01-01 DIAGNOSIS — F330 Major depressive disorder, recurrent, mild: Secondary | ICD-10-CM

## 2020-01-01 NOTE — Progress Notes (Signed)
Virtual Visit via Telephone Note  I connected with Cassandra Andrade on 01/01/20 at  2:30 PM EST by telephone and verified that I am speaking with the correct person using two identifiers.  Location: Patient: home Provider: office   I discussed the limitations, risks, security and privacy concerns of performing an evaluation and management service by telephone and the availability of in person appointments. I also discussed with the patient that there may be a patient responsible charge related to this service. The patient expressed understanding and agreed to proceed.  Type of Therapy: Individual Therapy   Treatment Goals addressed: improve psychiatric symptoms, elevate mood, Improve unhelpful thought patterns, interpersonal relationship skills, learn about diagnosis, healthy coping skills   Interventions: Motivational Interviewing   Summary: Cassandra Andrade is a 54 y.o. female who presents with Major Depressive Disorder, recurrent, mild   Suicidal/Homicidal: No -without intent/plan   Therapist Response: Cassandra Andrade met with clinician for an individual session. Cassandra Andrade discussed her psychiatric symptoms, her current life events and her homework. Cassandra Andrade reports that she is doing well overall. Clinician utilized MI OARS to reflect and summarize thoughts and feelings about her progress toward walking, motivation to wear her prosthetic leg all day every day, and keeping a vision of herself walking and even running one day. Clinician identified the importance of positive thoughts to keep her moving in the right direction. Clinician explored progress with foster child. Clinician reflected the challenges with fostering, particularly with adolescents. Clinician provided feedback and explanation about why the behaviors are so challenging. Clinician also identified the importance of coming to terms with what she and mother can handle, and what is too much.    Plan: Return again in 4 weeks.   Diagnosis: Axis I:  Major Depressive Disorder, recurrent, mild     I discussed the assessment and treatment plan with the patient. The patient was provided an opportunity to ask questions and all were answered. The patient agreed with the plan and demonstrated an understanding of the instructions.   The patient was advised to call back or seek an in-person evaluation if the symptoms worsen or if the condition fails to improve as anticipated.  I provided 40 minutes of non-face-to-face time during this encounter.   [name], LCSW

## 2020-01-01 NOTE — Progress Notes (Deleted)
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  She has a VNS, is on Lamictal, Vimpat, Keppra.  HISTORY 07/02/2019 Dr. [name]: Cassandra Andrade is a 54 year old right-handed white female with a history of a stroke related to a prior aneurysm rupture.  The patient has a right above-knee amputation, she has a prosthesis for this but she believes that her ability to walk has declined somewhat over the last several months, she has not had any further falls.  She tries to do exercises.  The patient is still having relatively frequent seizures, but most of her seizures she can abort with the vagal nerve stimulator.  She returns for further evaluation.  She remains on Lamictal, Vimpat, and Keppra.  She tolerates medications well.   REVIEW OF SYSTEMS: Out of a complete 14 system review of symptoms, the patient complains only of the following symptoms, and all other reviewed systems are negative.  ALLERGIES: Allergies  Allergen Reactions  . Vicodin [Hydrocodone-Acetaminophen] Nausea And Vomiting  . Morphine And Related Rash    Allergic reaction only in iv form    HOME MEDICATIONS: Outpatient Medications Prior to Visit  Medication Sig Dispense Refill  . acetaminophen (TYLENOL) 500 MG tablet Take 1,000 mg by mouth every 8 (eight) hours as needed for mild pain or headache.     . Ascorbic Acid (VITAMIN C) 1000 MG tablet Take 1,000 mg by mouth daily.     . Cholecalciferol (VITAMIN D) 50 MCG (2000 UT) CAPS Take 1 capsule by mouth daily.    [name] lamoTRIgine (LAMICTAL) 200 MG tablet TAKE 1 TABLET BY MOUTH TWICE A DAY 180 tablet 3  . lamoTRIgine (LAMICTAL) 25 MG tablet TAKE 2 TABLETS BY MOUTH TWICE A DAY 360 tablet 3  . levETIRAcetam (KEPPRA) 1000 MG tablet TAKE 2 TABLETS (2,000 MG TOTAL)  BY MOUTH 2 (TWO) TIMES DAILY. 360 tablet 1  . levothyroxine (SYNTHROID, LEVOTHROID) 50 MCG tablet Take 50 mcg by mouth daily before breakfast.    . Midazolam 5 MG/0.1ML SOLN Place 0.1 mLs into the nose once as needed for up to 1 dose (prolonged seizure). 1 each 3  . omeprazole (PRILOSEC) 20 MG capsule Take 20 mg by mouth daily.    . Prenatal Vit-Fe Fumarate-FA (PRENATAL MULTIVITAMIN) TABS tablet Take 1 tablet by mouth daily at 12 noon.    . promethazine (PHENERGAN) 25 MG tablet Take by mouth.    . venlafaxine XR (EFFEXOR-XR) 150 MG 24 hr capsule Take 1 capsule (150 mg total) by mouth daily with breakfast. 90 capsule 0  . VIMPAT 200 MG TABS tablet TAKE 1 TABLET BY MOUTH TWICE A DAY 60 tablet 5  . warfarin (COUMADIN) 5 MG tablet Take 1.5-2 tablets (7.5-10 mg total) by mouth See admin instructions. Pt takes one and a half tablets (7.5mg  total) on Sunday and Wednesday and 2 tablets (10mg  total) all other days. (Patient taking differently: Take 7.5-10 mg by mouth See admin instructions. Pt takes 7.5mg  on Sunday and Wednesday and 10mg  all other days.)     No facility-administered medications prior to visit.    PAST MEDICAL HISTORY: Past Medical History:  Diagnosis Date  . Anxiety   . Arthritis   . Cancer (HCC)    Osteosarcoma, right leg  . CVA (cerebrovascular accident due to intracerebral hemorrhage) ([place]) 2004  .  Depression   . DVT (deep venous thrombosis) (HCC)    Left leg  . MRSA (methicillin resistant Staphylococcus aureus)   . 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 Seizures ([place])   . Thyroid disease   . 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 ABOVE KNEE LEG AMPUTATION Right    Osteosarcoma  . BRAIN SURGERY  2004   cerebral aneurysm  . FRACTURE SURGERY     Rt. femur, folllowed by MRSA  . TOTAL KNEE ARTHROPLASTY Right     FAMILY HISTORY: Family History   Problem Relation Age of Onset  . Anxiety disorder Mother   . Diabetes Father     SOCIAL HISTORY: Social History   Socioeconomic History  . Marital status: Legally Separated    Spouse name: Not on file  . Number of children: 3  . Years of education: GED  . Highest education level: Not on file  Occupational History  . Not on file  Tobacco Use  . Smoking status: Never Smoker  . Smokeless tobacco: Never Used  . Tobacco comment: never used tobacco  Vaping Use  . Vaping Use: Never used  Substance and Sexual Activity  . Alcohol use: No    Alcohol/week: 0.0 standard drinks  . Drug use: No  . Sexual activity: Not Currently  Other Topics Concern  . Not on file  Social History Narrative   Patient lives at home with mom.    Patient has GED.    Patient has 3 children.    Patient is married but in the process of divorced.       Social Determinants of Health   Financial Resource Strain:   . Difficulty of Paying Living Expenses: Not on file  Food Insecurity:   . Worried About Charity fundraiser in the Last Year: Not on file  . Ran Out of Food in the Last Year: Not on file  Transportation Needs:   . Lack of Transportation (Medical): Not on file  . Lack of Transportation (Non-Medical): Not on file  Physical Activity:   . Days of Exercise per Week: Not on file  . Minutes of Exercise per Session: Not on file  Stress:   . Feeling of Stress : Not on file  Social Connections:   . Frequency of Communication with Friends and Family: Not on file  . Frequency of Social Gatherings with Friends and Family: Not on file  . Attends Religious Services: Not on file  . Active Member of Clubs or Organizations: Not on file  . Attends Archivist Meetings: Not on file  . Marital Status: Not on file  Intimate Partner Violence:   . Fear of Current or Ex-Partner: Not on file  . Emotionally Abused: Not on file  . Physically Abused: Not on file  . Sexually Abused: Not on file       PHYSICAL EXAM  There were no vitals filed for this visit. There is no height or weight on file to calculate BMI.  Generalized: Well developed, in no acute distress   Neurological examination  Mentation: Alert oriented to time, place, history taking. Follows all commands speech and language fluent Cranial nerve II-XII: Pupils were equal round reactive to light. Extraocular movements were full, visual field were full on confrontational test. Facial sensation and strength were normal. Uvula tongue midline. Head turning and shoulder shrug  were normal and symmetric. Motor: The motor testing reveals 5 over 5 strength of all 4 extremities. Good symmetric motor tone is noted throughout.  Sensory: Sensory testing is intact to soft touch on all 4 extremities. No evidence of extinction is noted.  Coordination: Cerebellar testing reveals good finger-nose-finger and heel-to-shin bilaterally.  Gait and station: Gait is normal. Tandem gait is normal. Romberg is negative. No drift is seen.  Reflexes: Deep tendon reflexes are symmetric and normal bilaterally.   DIAGNOSTIC DATA (LABS, IMAGING, TESTING) - I reviewed patient records, labs, notes, testing and imaging myself where available.  Lab Results  Component Value Date   WBC 3.5 12/25/2018   HGB 12.7 12/25/2018   HCT 40.1 12/25/2018   MCV 88 12/25/2018   PLT 242 12/25/2018      Component Value Date/Time   NA 143 12/25/2018 1045   NA 141 01/12/2017 1125   K 4.6 12/25/2018 1045   K 4.5 01/12/2017 1125   CL 100 12/25/2018 1045   CL 97 (L) 07/11/2013 1346   CO2 28 12/25/2018 1045   CO2 30 (H) 01/12/2017 1125   GLUCOSE 87 12/25/2018 1045   GLUCOSE 108 (H) 03/03/2018 1526   GLUCOSE 79 01/12/2017 1125   GLUCOSE 126 (H) 07/11/2013 1346   BUN 16 12/25/2018 1045   BUN 11.2 01/12/2017 1125   CREATININE 1.10 (H) 12/25/2018 1045   CREATININE 0.98 01/11/2018 1116   CREATININE 0.9 01/12/2017 1125   CALCIUM 9.9 12/25/2018 1045   CALCIUM  10.1 01/12/2017 1125   PROT 7.8 12/25/2018 1045   PROT 7.8 01/12/2017 1125   ALBUMIN 4.9 12/25/2018 1045   ALBUMIN 4.0 01/12/2017 1125   AST 22 12/25/2018 1045   AST 23 01/11/2018 1116   AST 34 01/12/2017 1125   ALT 24 12/25/2018 1045   ALT 22 01/11/2018 1116   ALT 65 (H) 01/12/2017 1125   ALKPHOS 114 12/25/2018 1045   ALKPHOS 105 01/12/2017 1125   BILITOT 0.2 12/25/2018 1045   BILITOT 0.2 (L) 01/11/2018 1116   BILITOT 0.23 01/12/2017 1125   GFRNONAA 58 (L) 12/25/2018 1045   GFRNONAA >60 01/11/2018 1116   GFRAA 67 12/25/2018 1045   GFRAA >60 01/11/2018 1116   No results found for: CHOL, HDL, LDLCALC, LDLDIRECT, TRIG, CHOLHDL Lab Results  Component Value Date   HGBA1C (L) 12/26/2009    4.3 (NOTE)                                                                       According to the ADA Clinical Practice Recommendations for 2011, when HbA1c is used as a screening test:   >=6.5%   Diagnostic of Diabetes Mellitus           (if abnormal result  is confirmed)  5.7-6.4%   Increased risk of developing Diabetes Mellitus  References:Diagnosis and Classification of Diabetes Mellitus,Diabetes [id number],7846,96(EXBMW 1):S62-S69 and Standards of Medical Care in         Diabetes - 2011,Diabetes Care,2011,34  (Suppl 1):S11-S61.   Lab Results  Component Value Date   [id number] 725 02/11/2010   Lab Results  Component Value Date   TSH 2.417 02/17/2013      ASSESSMENT AND PLAN 54 y.o. year old female  has a  past medical history of Anxiety, Arthritis, Cancer ([place]), CVA (cerebrovascular accident due to intracerebral hemorrhage) ([place]) (2004), Depression, DVT (deep venous thrombosis) ([place]), MRSA (methicillin resistant Staphylococcus aureus), S/P BKA (below knee amputation) unilateral ([place]), Seizures ([place]), Thyroid disease, and Unspecified epilepsy with intractable epilepsy (02/28/2013). here with ***   I spent 15 minutes with the patient. 50% of this time was spent   Butler Denmark, [place], DNP  01/01/2020, 4:22 PM [facility] [address] [place], [place] [address] [phone]

## 2020-01-02 ENCOUNTER — Encounter: Payer: Self-pay | Admitting: Neurology

## 2020-01-02 ENCOUNTER — Ambulatory Visit: Payer: Medicare Other | Admitting: Rehabilitation

## 2020-01-02 ENCOUNTER — Ambulatory Visit: Payer: Medicare Other | Admitting: Neurology

## 2020-01-21 ENCOUNTER — Institutional Professional Consult (permissible substitution): Payer: Medicare Other | Admitting: Neurology

## 2020-01-23 DIAGNOSIS — Z86718 Personal history of other venous thrombosis and embolism: Secondary | ICD-10-CM | POA: Diagnosis not present

## 2020-01-25 ENCOUNTER — Other Ambulatory Visit (HOSPITAL_COMMUNITY): Payer: Self-pay | Admitting: Psychiatry

## 2020-01-25 DIAGNOSIS — F419 Anxiety disorder, unspecified: Secondary | ICD-10-CM

## 2020-01-25 DIAGNOSIS — F321 Major depressive disorder, single episode, moderate: Secondary | ICD-10-CM

## 2020-02-04 ENCOUNTER — Telehealth (INDEPENDENT_AMBULATORY_CARE_PROVIDER_SITE_OTHER): Payer: Medicare Other | Admitting: Psychiatry

## 2020-02-04 ENCOUNTER — Other Ambulatory Visit: Payer: Self-pay

## 2020-02-04 ENCOUNTER — Encounter (HOSPITAL_COMMUNITY): Payer: Self-pay | Admitting: Psychiatry

## 2020-02-04 DIAGNOSIS — F419 Anxiety disorder, unspecified: Secondary | ICD-10-CM | POA: Diagnosis not present

## 2020-02-04 DIAGNOSIS — F321 Major depressive disorder, single episode, moderate: Secondary | ICD-10-CM

## 2020-02-04 MED ORDER — VENLAFAXINE HCL ER 150 MG PO CP24: 150.0000 mg | ORAL_CAPSULE | Freq: Every day | ORAL | 0 refills | Status: DC

## 2020-02-04 NOTE — Progress Notes (Signed)
Virtual Visit via Telephone Note  I connected with Sagal Bhattacharyya on 02/04/20 at 11:00 AM EST by telephone and verified that I am speaking with the correct person using two identifiers.  Location: Patient: Home Provider: Home Office   I discussed the limitations, risks, security and privacy concerns of performing an evaluation and management service by telephone and the availability of in person appointments. I also discussed with the patient that there may be a patient responsible charge related to this service. The patient expressed understanding and agreed to proceed.   History of Present Illness: Patient is evaluated by phone session.  She is on the phone by herself.  She is taking her medication but she admitted sometimes she is not motivated to walk.  Even though she is using processes more frequently and sometimes she uses cane to support her balance but she wished that she have walk better and make recovery sooner.  She is also in physical therapy.  She did make a lot of improvement from the past.  She feels her depression and anxiety is sporadic and some time she goes very down when she do not see improvement in her walking.  Overall she reported her sleep is good.  She denies any crying spells, feeling of hopelessness or worthlessness.  She see her kids regularly and she enjoyed.  She does not want to change the medication.  She is in therapy with Janett Billow.  She is also pleased that there are less episodes of seizures and her medicines is working.  She has no tremors or shakes.  Her energy level is okay.  Her appetite is okay.  She denies any panic attack.   Past Psychiatric History: H/O inpatientin 2011 due to overdose on pain medication.No h/omania or psychosis. Tried Zoloft and Remeron but do not remember. Took Lexapro and Abilify but stopped due to lack of response. Prozac worked for a long time until it stopped working.   Psychiatric Specialty Exam: Physical Exam  Review of  Systems  There were no vitals taken for this visit.There is no height or weight on file to calculate BMI.  General Appearance: NA  Eye Contact:  NA  Speech:  Normal Rate  Volume:  Normal  Mood:  Euthymic  Affect:  NA  Thought Process:  Goal Directed  Orientation:  Full (Time, Place, and Person)  Thought Content:  Logical  Suicidal Thoughts:  No  Homicidal Thoughts:  No  Memory:  Immediate;   Good Recent;   Fair Remote;   Fair  Judgement:  Intact  Insight:  Present  Psychomotor Activity:  NA  Concentration:  Concentration: Fair and Attention Span: Fair  Recall:  [employer] of Knowledge:  Good  Language:  Good  Akathisia:  No  Handed:  Right  AIMS (if indicated):     Assets:  Communication Skills Desire for Improvement Housing Social Support  ADL's:  Intact  Cognition:  WNL  Sleep:   ok      Assessment and Plan: Major depressive disorder, recurrent.  Anxiety.  Discussed her anxiety and depression.  Patient is still need a lot of motivation to work on her prosthesis.  But overall she feels her medicines are working but she need to be motivated to work herself.  I encouraged to continue therapy with Janett Billow.  Continue Effexor 150 mg daily.  Recommended to call us back if she has any question or any concern.  Follow-up in 3 months.  Follow Up Instructions:  I discussed the assessment and treatment plan with the patient. The patient was provided an opportunity to ask questions and all were answered. The patient agreed with the plan and demonstrated an understanding of the instructions.   The patient was advised to call back or seek an in-person evaluation if the symptoms worsen or if the condition fails to improve as anticipated.  I provided 18 minutes of non-face-to-face time during this encounter.   Kathlee Nations, MD

## 2020-02-06 ENCOUNTER — Other Ambulatory Visit: Payer: Self-pay

## 2020-02-06 ENCOUNTER — Ambulatory Visit (INDEPENDENT_AMBULATORY_CARE_PROVIDER_SITE_OTHER): Payer: Medicare Other | Admitting: Licensed Clinical Social Worker

## 2020-02-06 DIAGNOSIS — F330 Major depressive disorder, recurrent, mild: Secondary | ICD-10-CM

## 2020-02-07 ENCOUNTER — Encounter (HOSPITAL_COMMUNITY): Payer: Self-pay | Admitting: Licensed Clinical Social Worker

## 2020-02-07 NOTE — Progress Notes (Signed)
Virtual Visit via Telephone Note  I connected with Cassandra Andrade on 02/07/20 at  2:30 PM EST by telephone and verified that I am speaking with the correct person using two identifiers.  Location: Patient: home Provider: home office   I discussed the limitations, risks, security and privacy concerns of performing an evaluation and management service by telephone and the availability of in person appointments. I also discussed with the patient that there may be a patient responsible charge related to this service. The patient expressed understanding and agreed to proceed.   Type of Therapy: Individual Therapy   Treatment Goals addressed: improve psychiatric symptoms, elevate mood, Improve unhelpful thought patterns, interpersonal relationship skills, learn about diagnosis, healthy coping skills   Interventions: Motivational Interviewing   Summary: Cassandra Andrade is a 54 y.o. female who presents with Major Depressive Disorder, recurrent, mild   Suicidal/Homicidal: No -without intent/plan   Therapist Response: Cassandra Andrade met with clinician for an individual session. Cassandra Andrade discussed her psychiatric symptoms, her current life events and her homework. Cassandra Andrade reports that she continues to do well emotionally. She reports that the foster child that was staying with them is gone to another home. Clinician utilized MI OARS to reflect and summarize the mixed feelings about having the child moved. Clinician explored the way Cassandra Andrade and mom have coped with that decision and the sense of sadness, but also certainty that it was the correct decision. Clinician validated that choice and explored positive thoughts about other subjects. Clinician explored interactions with mom and children. Cassandra Andrade reports her son came to visit the other week and she felt really happy. Cassandra Andrade also reports she has a better attitude about wearing her prosthetic leg daily and practicing her walking.    Plan: Return again in 4 weeks.    Diagnosis: Axis I: Major Depressive Disorder, recurrent, mild   I discussed the assessment and treatment plan with the patient. The patient was provided an opportunity to ask questions and all were answered. The patient agreed with the plan and demonstrated an understanding of the instructions.   The patient was advised to call back or seek an in-person evaluation if the symptoms worsen or if the condition fails to improve as anticipated.  I provided 45 minutes of non-face-to-face time during this encounter.   [name], LCSW

## 2020-02-08 ENCOUNTER — Telehealth: Payer: Self-pay | Admitting: Neurology

## 2020-02-08 NOTE — Telephone Encounter (Signed)
Pt's mother, Cassandra Andrade (on [place]) called, psychiatrist suggested to see her neurologist. She discuss with him how she feel certain way before she has a seizure. Her vegal nerve in her chest needs to be checked. Was not able to charge last visit. Would like a call from the nurse.

## 2020-02-11 NOTE — Telephone Encounter (Signed)
Called patient back and stated that patient is having problems with what they believe is her vagal nerve stimulator.  Was able to offer appointment in January and she was happy with that.  Mom expressed appreciation.

## 2020-02-19 ENCOUNTER — Telehealth: Payer: Self-pay | Admitting: Neurology

## 2020-02-19 ENCOUNTER — Other Ambulatory Visit: Payer: Self-pay | Admitting: Neurology

## 2020-02-19 MED ORDER — TOPIRAMATE 25 MG PO TABS: 25.0000 mg | ORAL_TABLET | Freq: Two times a day (BID) | ORAL | 3 refills | Status: DC

## 2020-02-19 NOTE — Telephone Encounter (Signed)
Pt's mother reporting multiple Seizures today. Feels like phantom leg pain may be a contributing factor. Please call ASAP [phone]

## 2020-02-19 NOTE — Telephone Encounter (Signed)
I returned the call from patient's mother who informed me that there was significant increase in her phantom limb pain and she was worried that this would increase her seizure frequency.  She is still having 2-3 brief seizures per week which is her baseline and remains on Keppra, lamotrigine and Vimpat.  I suggest she try Topamax 25 mg twice daily for phantom limb pain and increase gradually if necessary.  She was advised to call Dr. Anne Hahn and see him soon

## 2020-02-19 NOTE — Progress Notes (Signed)
I returned a call from the patient's mother who stated that she was having significant increase in her phantom limb pain and she felt that this may have triggered some of her recent seizures.  She is still having about 2-3 seizures per week.  She remains on Vimpat, lamotrigine and Keppra.  She is not currently on any medication for her phantom limb pain.  I recommend she start with Topamax 25 mg twice daily and may increase further as needed.  She was advised to call Dr. Anne Hahn and see him soon as her vagal nerve stimulator has not been interrogated for a while.

## 2020-02-20 ENCOUNTER — Other Ambulatory Visit: Payer: Self-pay

## 2020-02-20 ENCOUNTER — Emergency Department (HOSPITAL_COMMUNITY)
Admission: EM | Admit: 2020-02-20 | Discharge: 2020-02-20 | Disposition: A | Discharge status: Home / Self Care | Payer: Medicare Other | Attending: Emergency Medicine | Admitting: Emergency Medicine

## 2020-02-20 ENCOUNTER — Telehealth: Payer: Self-pay | Admitting: Neurology

## 2020-02-20 ENCOUNTER — Institutional Professional Consult (permissible substitution): Payer: Medicare Other | Admitting: Neurology

## 2020-02-20 ENCOUNTER — Encounter (HOSPITAL_COMMUNITY): Payer: Self-pay | Admitting: Emergency Medicine

## 2020-02-20 DIAGNOSIS — Z5321 Procedure and treatment not carried out due to patient leaving prior to being seen by health care provider: Secondary | ICD-10-CM | POA: Insufficient documentation

## 2020-02-20 DIAGNOSIS — Z86718 Personal history of other venous thrombosis and embolism: Secondary | ICD-10-CM | POA: Diagnosis not present

## 2020-02-20 DIAGNOSIS — G546 Phantom limb syndrome with pain: Secondary | ICD-10-CM | POA: Insufficient documentation

## 2020-02-20 DIAGNOSIS — M79604 Pain in right leg: Secondary | ICD-10-CM | POA: Diagnosis present

## 2020-02-20 LAB — CBC
HCT: 37.3 % (ref 36.0–46.0)
Hemoglobin: 11.7 g/dL — ABNORMAL LOW (ref 12.0–15.0)
MCH: 28.1 pg (ref 26.0–34.0)
MCHC: 31.4 g/dL (ref 30.0–36.0)
MCV: 89.4 fL (ref 80.0–100.0)
Platelets: 183 10*3/uL (ref 150–400)
RBC: 4.17 MIL/uL (ref 3.87–5.11)
RDW: 13 % (ref 11.5–15.5)
WBC: 2.7 10*3/uL — ABNORMAL LOW (ref 4.0–10.5)
nRBC: 0 % (ref 0.0–0.2)

## 2020-02-20 LAB — BASIC METABOLIC PANEL
Anion gap: 9 (ref 5–15)
BUN: 9 mg/dL (ref 6–20)
CO2: 27 mmol/L (ref 22–32)
Calcium: 9.2 mg/dL (ref 8.9–10.3)
Chloride: 98 mmol/L (ref 98–111)
Creatinine, Ser: 0.96 mg/dL (ref 0.44–1.00)
GFR, Estimated: >60 mL/min (ref >60)
Glucose, Bld: 96 mg/dL (ref 70–99)
Potassium: 3.3 mmol/L — ABNORMAL LOW (ref 3.5–5.1)
Sodium: 134 mmol/L — ABNORMAL LOW (ref 135–145)

## 2020-02-20 NOTE — ED Triage Notes (Addendum)
Pt arrives to ED with c/o of phantom limb pain to right leg yesterday. Leg is a below the knee amputation. Pt states she can feel the pain in her foot in the missing leg. States she sometimes experiences seizures when phantom pain starts. Normally has 2-3 seizures per week.

## 2020-02-20 NOTE — Telephone Encounter (Signed)
Ok to reschedule

## 2020-02-20 NOTE — Telephone Encounter (Signed)
Pt no showed to sleep consult on 01/21/20. Pt was rescheduled for 02/20/20. Pt called on 02/19/20 at 4:30 pm to stated she would not make the appt that was scheduled for the following day due to feeling sick. This cancellation is considered a no show. Pt is currently in the ED at [facility]. Due to pt being in the ED do you want the pt to be rescheduled for another appt if the pt calls back?

## 2020-02-20 NOTE — ED Notes (Signed)
Called for room x 3, no answer 

## 2020-02-21 NOTE — Telephone Encounter (Signed)
The patient will be seen in January.

## 2020-02-27 ENCOUNTER — Other Ambulatory Visit: Payer: Self-pay

## 2020-02-27 ENCOUNTER — Emergency Department (HOSPITAL_COMMUNITY)
Admission: EM | Admit: 2020-02-27 | Discharge: 2020-02-27 | Disposition: A | Discharge status: Home / Self Care | Payer: Medicare Other | Attending: Emergency Medicine | Admitting: Emergency Medicine

## 2020-02-27 ENCOUNTER — Other Ambulatory Visit: Payer: Self-pay | Admitting: Neurology

## 2020-02-27 ENCOUNTER — Telehealth: Payer: Self-pay | Admitting: Neurology

## 2020-02-27 DIAGNOSIS — Z85830 Personal history of malignant neoplasm of bone: Secondary | ICD-10-CM | POA: Diagnosis not present

## 2020-02-27 DIAGNOSIS — Z79899 Other long term (current) drug therapy: Secondary | ICD-10-CM | POA: Insufficient documentation

## 2020-02-27 DIAGNOSIS — R569 Unspecified convulsions: Secondary | ICD-10-CM | POA: Diagnosis not present

## 2020-02-27 DIAGNOSIS — Z7901 Long term (current) use of anticoagulants: Secondary | ICD-10-CM | POA: Diagnosis not present

## 2020-02-27 DIAGNOSIS — Z96651 Presence of right artificial knee joint: Secondary | ICD-10-CM | POA: Insufficient documentation

## 2020-02-27 DIAGNOSIS — E039 Hypothyroidism, unspecified: Secondary | ICD-10-CM | POA: Insufficient documentation

## 2020-02-27 LAB — URINALYSIS, ROUTINE W REFLEX MICROSCOPIC
Bilirubin Urine: NEGATIVE
Glucose, UA: NEGATIVE mg/dL
Ketones, ur: NEGATIVE mg/dL
Nitrite: NEGATIVE
Protein, ur: NEGATIVE mg/dL
Specific Gravity, Urine: 1.015 (ref 1.005–1.030)
pH: 6 (ref 5.0–8.0)

## 2020-02-27 LAB — COMPREHENSIVE METABOLIC PANEL
ALT: 28 U/L (ref 0–44)
AST: 27 U/L (ref 15–41)
Albumin: 4.3 g/dL (ref 3.5–5.0)
Alkaline Phosphatase: 71 U/L (ref 38–126)
Anion gap: 11 (ref 5–15)
BUN: 17 mg/dL (ref 6–20)
CO2: 30 mmol/L (ref 22–32)
Calcium: 9.5 mg/dL (ref 8.9–10.3)
Chloride: 100 mmol/L (ref 98–111)
Creatinine, Ser: 1.17 mg/dL — ABNORMAL HIGH (ref 0.44–1.00)
GFR, Estimated: 56 mL/min — ABNORMAL LOW (ref >60)
Glucose, Bld: 97 mg/dL (ref 70–99)
Potassium: 3.9 mmol/L (ref 3.5–5.1)
Sodium: 141 mmol/L (ref 135–145)
Total Bilirubin: 0.5 mg/dL (ref 0.3–1.2)
Total Protein: 7.9 g/dL (ref 6.5–8.1)

## 2020-02-27 LAB — CBC WITH DIFFERENTIAL/PLATELET
Abs Immature Granulocytes: 0 10*3/uL (ref 0.00–0.07)
Basophils Absolute: 0 10*3/uL (ref 0.0–0.1)
Basophils Relative: 1 %
Eosinophils Absolute: 0.1 10*3/uL (ref 0.0–0.5)
Eosinophils Relative: 2 %
HCT: 38.4 % (ref 36.0–46.0)
Hemoglobin: 12.2 g/dL (ref 12.0–15.0)
Immature Granulocytes: 0 %
Lymphocytes Relative: 56 %
Lymphs Abs: 1.7 10*3/uL (ref 0.7–4.0)
MCH: 29.2 pg (ref 26.0–34.0)
MCHC: 31.8 g/dL (ref 30.0–36.0)
MCV: 91.9 fL (ref 80.0–100.0)
Monocytes Absolute: 0.3 10*3/uL (ref 0.1–1.0)
Monocytes Relative: 11 %
Neutro Abs: 0.9 10*3/uL — ABNORMAL LOW (ref 1.7–7.7)
Neutrophils Relative %: 30 %
Platelets: 239 10*3/uL (ref 150–400)
RBC: 4.18 MIL/uL (ref 3.87–5.11)
RDW: 12.5 % (ref 11.5–15.5)
WBC: 3 10*3/uL — ABNORMAL LOW (ref 4.0–10.5)
nRBC: 0 % (ref 0.0–0.2)

## 2020-02-27 MED ORDER — MIDAZOLAM 5 MG/ML ADULT INJ FOR INTRANASAL USE (MC USE ONLY): 5.0000 mg | Freq: Once | INTRAMUSCULAR | 3 refills | Status: DC | PRN

## 2020-02-27 MED ORDER — SODIUM CHLORIDE 0.9 % IV SOLN: INTRAVENOUS | Status: DC

## 2020-02-27 MED ORDER — MIDAZOLAM 5 MG/0.1ML NA SOLN: 0.1000 mL | Freq: Once | NASAL | 3 refills | Status: DC | PRN

## 2020-02-27 NOTE — Discharge Instructions (Addendum)
Continue giving her her usual medicines, and use the vagal nerve stimulator, as needed.  If you feel that it does not work, or she cannot sense it giving her stimulation, have Dr. Anne Hahn check it out.

## 2020-02-27 NOTE — ED Notes (Signed)
Seizure pads applied to bed.  

## 2020-02-27 NOTE — Telephone Encounter (Signed)
The mother called, the patient has gone into a prolonged seizure, she has had a generalized seizure that she believes has been gone about a half an hour.  EMS is present, I have indicated that she is to be taken to the emergency room as soon as possible.  The patient has intractable epilepsy, she has a vagal nerve stimulator in place.  Her seizures have never been under full control but this clearly is a more severe seizure than usual.

## 2020-02-27 NOTE — ED Provider Notes (Signed)
[facility] DEPT Provider Note   CSN: [id number] Arrival date & time: 02/27/20  1014     History Chief Complaint  Patient presents with  . Seizures    Cassandra Andrade is a 55 y.o. female.  HPI Patient presents for evaluation of a seizure that lasted about 30 minutes, much longer than her typical seizures.  This information is available in the EMR, no one is with the patient that the time I saw her, 10:45 AM.  At this time the patient is alert and responsive.  She is unable to give history.  Level 5 caveat-altered mental status    Past Medical History:  Diagnosis Date  . Anxiety   . Arthritis   . Cancer (HCC)    Osteosarcoma, right leg  . CVA (cerebrovascular accident due to intracerebral hemorrhage) ([place]) 2004  . Depression   . DVT (deep venous thrombosis) (HCC)    Left leg  . MRSA (methicillin resistant Staphylococcus aureus)   . 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 Seizures ([place])   . Thyroid disease   . 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 History of deep venous thrombosis 05/05/2015  . Personal history of osteosarcoma 05/05/2015  . Anemia 11/04/2014  . Abnormal finding on mammography 05/18/2014  . Major depression, recurrent ([place]) 03/19/2014  . Vision disturbance 02/28/2014  . Postprocedural state 12/31/2013  . History of biliary T-tube placement 12/31/2013  . Lupus anticoagulant disorder ([place]) 12/20/2013  . Back pain, chronic 11/07/2013  . Absence of bladder continence 08/05/2013  . History of anticoagulant therapy 07/17/2013  . Long term current use of anticoagulant therapy 07/17/2013  . Partial epilepsy with impairment of consciousness ([place]) 06/21/2013  . Intractable epilepsy ([place]) 02/28/2013  . Absolute anemia 02/28/2013  . Seizures ([place]) 02/21/2013  . Seizure ([place]) 02/17/2013  . DVT, bilateral lower limbs ([place]) 11/07/2012   . Hypothyroid 07/04/2012  . Seizure disorder ([place]) 07/04/2012  . Chronic pain syndrome 07/04/2012  . S/P BKA (below knee amputation) ([place]) 07/04/2012  . Chronic pain associated with significant psychosocial dysfunction 07/04/2012  . Cerebrovascular accident, late effects 06/05/2012  . Status post above knee amputation 06/08/2011  . Mechanical complication of graft of bone, cartilage, muscle or tendon 05/10/2011  . Depression 05/03/2011  . 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 ABOVE KNEE LEG AMPUTATION Right    Osteosarcoma  . BRAIN SURGERY  2004   cerebral aneurysm  . FRACTURE SURGERY     Rt. femur, folllowed by MRSA  . TOTAL KNEE ARTHROPLASTY Right      OB History   No obstetric history on file.     Family History  Problem Relation Age of Onset  . Anxiety disorder Mother   . Diabetes Father     Social History   Tobacco Use  . Smoking status: Never Smoker  . Smokeless tobacco: Never Used  . Tobacco comment: never used tobacco  Vaping Use  . Vaping Use: Never used  Substance Use Topics  . Alcohol use: No    Alcohol/week: 0.0 standard drinks  . Drug use: No    Home Medications Prior to Admission medications   Medication Sig Start Date End Date Taking? Authorizing Provider  acetaminophen (TYLENOL) 650 MG CR tablet Take 1,300 mg by mouth every 8 (eight) hours as needed for pain.  Yes [provider]  Ascorbic Acid (VITAMIN C) 1000 MG tablet Take 1,000 mg by mouth daily. 08/17/11  Yes [provider]  lamoTRIgine (LAMICTAL) 200 MG tablet TAKE 1 TABLET BY MOUTH TWICE A DAY Patient taking differently: Take 200 mg by mouth 2 (two) times daily. 06/07/19  Yes Suzzanne Cloud, NP  lamoTRIgine (LAMICTAL) 25 MG tablet TAKE 2 TABLETS BY MOUTH TWICE A DAY Patient taking differently: Take 50 mg by mouth 2 (two) times daily. 06/25/19  Yes Kathrynn Ducking, MD  levETIRAcetam (KEPPRA) 1000 MG  tablet TAKE 2 TABLETS (2,000 MG TOTAL) BY MOUTH 2 (TWO) TIMES DAILY. Patient taking differently: Take 2,000 mg by mouth 2 (two) times daily. 10/04/19  Yes Kathrynn Ducking, MD  levothyroxine (SYNTHROID, LEVOTHROID) 50 MCG tablet Take 50 mcg by mouth daily before breakfast.   Yes [provider]  omeprazole (PRILOSEC) 20 MG capsule Take 20 mg by mouth daily. 06/12/19  Yes [provider]  Prenatal Vit-Fe Fumarate-FA (PRENATAL MULTIVITAMIN) TABS tablet Take 1 tablet by mouth daily at 12 noon.   Yes [provider]  promethazine (PHENERGAN) 25 MG tablet Take 25 mg by mouth every 8 (eight) hours as needed for nausea or vomiting. 04/27/19  Yes [provider]  venlafaxine XR (EFFEXOR-XR) 150 MG 24 hr capsule Take 1 capsule (150 mg total) by mouth daily with breakfast. 02/04/20  Yes Arfeen, Arlyce Harman, MD  VIMPAT 200 MG TABS tablet TAKE 1 TABLET BY MOUTH TWICE A DAY Patient taking differently: Take 200 mg by mouth 2 (two) times daily. 12/25/19  Yes Suzzanne Cloud, NP  warfarin (COUMADIN) 5 MG tablet Take 1.5-2 tablets (7.5-10 mg total) by mouth See admin instructions. Pt takes one and a half tablets (7.82m total) on Sunday and Wednesday and 2 tablets (165mtotal) all other days. Patient taking differently: Take 7.5-10 mg by mouth See admin instructions. As per the instructions from coumadin clinic 01/03/18  Yes SiThurnell LoseMD  midazolam (VERSED) 5 MG/ML injection Place 1 mL (5 mg total) into the nose once as needed for up to 1 dose. Draw up prescribed dose (ml) in syringe, remove blue vial access device, then attach syringe to nasal atomizer for intranasal administration.  Use if needed for prolonged seizure greater than 5 minutes. 02/27/20   WiKathrynn DuckingMD  Midazolam 5 MG/0.1ML SOLN Place 0.1 mLs into the nose once as needed for up to 1 dose (prolonged seizure). 03/23/18   WiKathrynn DuckingMD  topiramate (TOPAMAX) 25 MG tablet Take 1 tablet (25 mg total) by mouth 2  (two) times daily. Patient not taking: No sig reported 02/19/20   SeGarvin FilaMD    Allergies    Vicodin [hydrocodone-acetaminophen] and Morphine and related  Review of Systems   Review of Systems  Unable to perform ROS: Other    Physical Exam Updated Vital Signs BP 130/75   Pulse 73   Temp 98.6 F (37 C) (Oral)   Resp 18   SpO2 100%   Physical Exam Vitals and nursing note reviewed.  Constitutional:      General: She is not in acute distress.    Appearance: She is well-developed and well-nourished. She is not ill-appearing, toxic-appearing or diaphoretic.  HENT:     Head: Normocephalic and atraumatic.     Right Ear: External ear normal.     Left Ear: External ear normal.     Mouth/Throat:     Mouth: Mucous membranes are  moist.     Comments: No visible trauma to tongue or lips. Eyes:     Extraocular Movements: EOM normal.     Conjunctiva/sclera: Conjunctivae normal.     Pupils: Pupils are equal, round, and reactive to light.  Neck:     Trachea: Phonation normal.  Cardiovascular:     Rate and Rhythm: Normal rate and regular rhythm.     Heart sounds: Normal heart sounds.  Pulmonary:     Effort: Pulmonary effort is normal.     Breath sounds: Normal breath sounds.  Chest:     Chest wall: No bony tenderness.  Abdominal:     Palpations: Abdomen is soft.     Tenderness: There is no abdominal tenderness.  Musculoskeletal:        General: Normal range of motion.     Cervical back: Normal range of motion and neck supple.     Comments: Follows commands to move arms and legs equally.  Right leg AKA, stump is larger than left but nontender to palpation.  Skin:    General: Skin is warm, dry and intact.  Neurological:     Mental Status: She is alert.     Cranial Nerves: No cranial nerve deficit.     Motor: No abnormal muscle tone.     Coordination: Coordination normal.     Comments: No dysarthria.  No tremor.  No abnormal muscle movements or spasticity.   Psychiatric:        Mood and Affect: Mood and affect and mood normal.        Behavior: Behavior normal.     ED Results / Procedures / Treatments   Labs (all labs ordered are listed, but only abnormal results are displayed) Labs Reviewed  COMPREHENSIVE METABOLIC PANEL - Abnormal; Notable for the following components:      Result Value   Creatinine, Ser 1.17 (*)    GFR, Estimated 56 (*)    All other components within normal limits  CBC WITH DIFFERENTIAL/PLATELET - Abnormal; Notable for the following components:   WBC 3.0 (*)    Neutro Abs 0.9 (*)    All other components within normal limits  URINALYSIS, ROUTINE W REFLEX MICROSCOPIC - Abnormal; Notable for the following components:   Hgb urine dipstick MODERATE (*)    Leukocytes,Ua MODERATE (*)    Bacteria, UA RARE (*)    All other components within normal limits  PATHOLOGIST SMEAR REVIEW    EKG None  Radiology No results found.  Procedures Procedures (including critical care time)  Medications Ordered in ED Medications  0.9 %  sodium chloride infusion ( Intravenous New Bag/Given 02/27/20 1128)    ED Course  I have reviewed the triage vital signs and the nursing notes.  Pertinent labs & imaging results that were available during my care of the patient were reviewed by me and considered in my medical decision making (see chart for details).  Clinical Course as of 02/27/20 1602  Wed Feb 27, 2020  1532 Reactive, Benign Lymphocytes: PRESENT [EW]  3295 I was able discuss the case with Dr. [name] her primary neurologist, at his office.  He stated that he will call in the as needed intranasal medicine, to treat possible seizures.  He feels like a vagal nerve stimulator is probably functional, but can be checked at his office if needed. [EW]    Clinical Course User Index [EW] Daleen Bo, MD   MDM Rules/Calculators/A&P  Patient Vitals for the past 24 hrs:  BP Temp Temp src Pulse Resp SpO2   02/27/20 1530 130/75 - - 73 18 100 %  02/27/20 1445 139/78 - - 77 16 100 %  02/27/20 1400 137/82 - - 74 16 100 %  02/27/20 1300 (!) 154/97 - - 72 18 100 %  02/27/20 1223 (!) 151/92 - - 72 16 99 %  02/27/20 1146 (!) 141/113 - - 72 16 97 %  02/27/20 1030 (!) 147/84 98.6 F (37 C) Oral 93 19 98 %  02/27/20 1021 - - - - - 95 %    3:59 PM Reevaluation with update and discussion. After initial assessment and treatment, an updated evaluation reveals patient remained stable.  I discussed the telephone conversation that I had with her neurologist, with the mother who was in the room.  All questions were answere. Daleen Bo   Medical Decision Making:  This patient is presenting for evaluation of seizure longer than usual, which does require a range of treatment options, and is a complaint that involves a moderate risk of morbidity and mortality. The differential diagnoses include electrolyte disorder acute infection, change in seizure pattern. I decided to review old records, and in summary chronically debilitated patient, cared for at home, with aide, who takes care of the medications for epilepsy.  I obtained additional historical information from mother at bedside.  Clinical Laboratory Tests Ordered, included CBC, Metabolic panel and Urinalysis. Review indicates CBC and c-Met essentially normal.  Urinalysis with possible UTI, culture sent, will withhold treatment pending culture.  A sample was collected from [facility], despite a in and out cath being ordered.  I would treat for UTI if this returns showing atypical urinary pathogen, but would not treat if there was signs of contamination.   Critical Interventions-clinical evaluation, laboratory testing, observation and reassessment  After These Interventions, the Patient was reevaluated and was found stable for discharge.  Patient not seizing on arrival and had no seizure activity during period of observation.  Screening for unstable conditions  that tend to provoke seizures, was negative.  No indication for hospitalization or further changes, by me.  Her neurologist is sending a new prescription for treatment of prolonged seizures, to her pharmacy.  CRITICAL CARE-no Performed by: Daleen Bo  Nursing Notes Reviewed/ Care Coordinated Applicable Imaging Reviewed Interpretation of Laboratory Data incorporated into ED treatment  The patient appears reasonably screened and/or stabilized for discharge and I doubt any other medical condition or other [facility] requiring further screening, evaluation, or treatment in the ED at this time prior to discharge.  Plan: Home Medications-continue usual; Home Treatments-usual diet; return here if the recommended treatment, does not improve the symptoms; Recommended follow up-neurology as needed     Final Clinical Impression(s) / ED Diagnoses Final diagnoses:  Seizure [facility])    Rx / [place] Orders ED Discharge Orders    None       Daleen Bo, MD 02/27/20 1626

## 2020-02-27 NOTE — Telephone Encounter (Signed)
I got a call from Dr. Effie Shy from the [facility] ER.  The patient has resolved her seizures, she is at her baseline.  I will send in a prescription for intranasal Versed to take as a backup for a prolonged seizure event.

## 2020-02-27 NOTE — ED Notes (Signed)
Patient stated she needed to urinate. Pure wick was placed and I told the patient and her mother to let me know when she was done.

## 2020-02-27 NOTE — ED Triage Notes (Addendum)
[facility] transported pt from home to [facility] ED and reports the following:  Pt caretaker said pt had seizure. She wears a seizure bracelet which doesn't appear to be working today. The bracelet is at bedside with a patient label attached to it. EMS finds the patient more anxious and agitated than post seizure. Pt doesn't want to be at hospital but caretaker called pt provider. Provider recommended pt go to hospital. Caretaker reports pt hasn't been sick. She took seizure medication today. Pt communication is at baseline.

## 2020-02-28 ENCOUNTER — Other Ambulatory Visit: Payer: Self-pay | Admitting: Neurology

## 2020-02-28 ENCOUNTER — Telehealth: Payer: Self-pay | Admitting: Neurology

## 2020-02-28 LAB — PATHOLOGIST SMEAR REVIEW

## 2020-02-28 NOTE — Telephone Encounter (Signed)
Patient called back stated she needs pre-authorization for the midazolam nasal spray. I told her unfortunately will have to wait until Monday, will inform Dr. Anne Hahn and his team, apologized.

## 2020-02-28 NOTE — Telephone Encounter (Signed)
I called the patient, spoke with mother.  I sent in a prescription yesterday for Versed nasal spray.  The mother will call the pharmacy tomorrow to find out about this prescription.

## 2020-02-28 NOTE — Telephone Encounter (Signed)
Called and spoke to patient's mom.  She stated that a doctor told her that her vagal nerve stimulator is not working anymore.  Mom is requesting a medication in the form of a nasal spray or the kind that goes in her rectum to help her please.

## 2020-02-28 NOTE — Telephone Encounter (Signed)
Mother has called to report that the vagal nerve stimulator is no longer helping pt.  Mother wants to know if there is something Dr Anne Hahn can call in for pt to take

## 2020-02-28 NOTE — Telephone Encounter (Signed)
Pharmacist was able to take a verbal, I'll let patient's mother know thanks

## 2020-02-28 NOTE — Telephone Encounter (Addendum)
Patient's mother called and said medication was not called in to the pharmacy. Dr. Anne Hahn placed a prescription into Epic yesterday. I called and spoke to the pharmacist and she said she didn't receive the versed prescription. On closer review, Looks like the prescription was printed. I tried to reorder electronically but Epic would not let me e-prescribe and gave me an error stating that this medication was not configured for electronic prescribing(?).  I have never run across this before. I called pharmacist back to see if they would accept a faxed prescription for this medication as they may not for controlled substances. Unfortunately I have been on hold for extended amount of time. Will try to get through and will also fax a prescription, but may need to be addressed Monday.

## 2020-02-28 NOTE — Telephone Encounter (Signed)
See prior telephone note

## 2020-02-29 ENCOUNTER — Other Ambulatory Visit: Payer: Self-pay | Admitting: Neurology

## 2020-02-29 ENCOUNTER — Other Ambulatory Visit (HOSPITAL_COMMUNITY): Payer: Self-pay | Admitting: Psychiatry

## 2020-02-29 DIAGNOSIS — F321 Major depressive disorder, single episode, moderate: Secondary | ICD-10-CM

## 2020-02-29 DIAGNOSIS — F419 Anxiety disorder, unspecified: Secondary | ICD-10-CM

## 2020-02-29 LAB — URINE CULTURE

## 2020-03-03 ENCOUNTER — Telehealth: Payer: Self-pay | Admitting: Emergency Medicine

## 2020-03-03 NOTE — Progress Notes (Signed)
Prescription faxed to pharmacy.  [facility], fax # [phone],  OK confirmation received.

## 2020-03-03 NOTE — Telephone Encounter (Signed)
PA started on CMM for Nayzilam.  Awaiting determination from [place].

## 2020-03-04 NOTE — Telephone Encounter (Signed)
Request Reference Number: [id number]. NAYZILAM SPR 5MG  is approved through 02/21/2021.

## 2020-03-06 ENCOUNTER — Encounter (HOSPITAL_COMMUNITY): Payer: Self-pay | Admitting: Licensed Clinical Social Worker

## 2020-03-06 ENCOUNTER — Ambulatory Visit (INDEPENDENT_AMBULATORY_CARE_PROVIDER_SITE_OTHER): Payer: Medicare Other | Admitting: Licensed Clinical Social Worker

## 2020-03-06 ENCOUNTER — Other Ambulatory Visit: Payer: Self-pay

## 2020-03-06 DIAGNOSIS — F330 Major depressive disorder, recurrent, mild: Secondary | ICD-10-CM

## 2020-03-06 NOTE — Progress Notes (Signed)
Virtual Visit via Telephone Note  I connected with Cassandra Andrade on 03/06/20 at  3:30 PM EST by telephone and verified that I am speaking with the correct person using two identifiers.  Location: Patient: home Provider: home office   I discussed the limitations, risks, security and privacy concerns of performing an evaluation and management service by telephone and the availability of in person appointments. I also discussed with the patient that there may be a patient responsible charge related to this service. The patient expressed understanding and agreed to proceed.  Type of Therapy: Individual Therapy   Treatment Goals addressed: improve psychiatric symptoms, elevate mood, Improve unhelpful thought patterns, interpersonal relationship skills, learn about diagnosis, healthy coping skills   Interventions: Motivational Interviewing   Summary: Cassandra Andrade is a 54 y.o. female who presents with Major Depressive Disorder, recurrent, mild   Suicidal/Homicidal: No -without intent/plan   Therapist Response: Irelynd met with clinician for an individual session. Cassandra Andrade discussed her psychiatric symptoms, her current life events and her homework. Cassandra Andrade reports that she continues to do well emotionally. She reports that she continues to make progress with walking and now wants to get a cane. Clinician utilized MI OARS to reflect and summarize the hard work that has been done to get her to this place. Clinician encouraged Henleigh to speak with mother about getting a cane, as mother is very protective and worries about Cassandra Andrade falling. Clinician explored emotional interactions and note that overall things remain stable. No depression or anxiety that interferes on a daily basis or even weekly. Cassandra Andrade continues to be hard on herself about not walking yet. Clinician urged self compassion.     Plan: Return again in 4 weeks.   Diagnosis: Axis I: Major Depressive Disorder, recurrent, mild      I discussed  the assessment and treatment plan with the patient. The patient was provided an opportunity to ask questions and all were answered. The patient agreed with the plan and demonstrated an understanding of the instructions.   The patient was advised to call back or seek an in-person evaluation if the symptoms worsen or if the condition fails to improve as anticipated.  I provided 40 minutes of non-face-to-face time during this encounter.    R , LCSW  

## 2020-03-12 ENCOUNTER — Ambulatory Visit: Payer: Self-pay | Admitting: Neurology

## 2020-03-24 ENCOUNTER — Ambulatory Visit (INDEPENDENT_AMBULATORY_CARE_PROVIDER_SITE_OTHER): Payer: Medicare Other | Admitting: Neurology

## 2020-03-24 ENCOUNTER — Encounter: Payer: Self-pay | Admitting: Neurology

## 2020-03-24 VITALS — BP 128/87 | HR 96 | Ht 67.0 in | Wt 170.0 lb

## 2020-03-24 DIAGNOSIS — G40919 Epilepsy, unspecified, intractable, without status epilepticus: Secondary | ICD-10-CM | POA: Diagnosis not present

## 2020-03-24 DIAGNOSIS — G40119 Localization-related (focal) (partial) symptomatic epilepsy and epileptic syndromes with simple partial seizures, intractable, without status epilepticus: Secondary | ICD-10-CM

## 2020-03-24 DIAGNOSIS — Z5181 Encounter for therapeutic drug level monitoring: Secondary | ICD-10-CM | POA: Diagnosis not present

## 2020-03-24 NOTE — Procedures (Signed)
     History:Cassandra Easonis a 55 year old patient with a history of intractable epilepsy. The patient has had a vagal nerve stimulator placed, shecontinues to have frequent seizures, she may have seizures at least every 2-3 days. The patient is tolerating the vagal nerve stimulator well. She comes in for a reevaluation.   VAGAL NERVE STIMULATOR SETTINGS:  Output current: 1.74milliamps changed to 2.0 milliamps  Signal frequency: 30Hz   Pulse width: 249microseconds  On time:  21 seconds  Off time: 1.1 minutes  Magnet current: 2.36milliamps changed to 2.25 mA  Magnet on time: 60 seconds  Pulse width:258microseconds  Lead impedance:2533Ohms  IFI: No  The VNS unit was interrogated, and the settings were changed as above. The patient appeared to tolerate the changes well.  Jill Alexanders MD 10/12/20174:18 PM  [facility] [address] [place], [place] [address]  Phone [phone] Fax [phone]

## 2020-03-24 NOTE — Progress Notes (Signed)
Reason for visit: Seizures  Cassandra Andrade is an 55 y.o. female  History of present illness:  Cassandra Andrade is a 55 year old right-handed black female with a history of intractable epilepsy.  The patient has a history of a stroke associated with a prior aneurysm rupture.  She has a right above-knee amputation and prosthesis.  She has had a chronic issue with gait instability.  She has relatively frequent seizures that usually can be aborted by the use of the vagal nerve stimulator but recently she has not been able to do this.  She was seen in the emergency room on 27 February 2020 with a prolonged seizure.  She is on Lamictal, Vimpat, and Keppra.  She has done fairly well since coming out of the emergency room, she has had 2 brief seizures last week.  She claims that she has not been feeling the stimulations of her vagal nerve stimulator.  Past Medical History:  Diagnosis Date  . Anxiety   . Arthritis   . Cancer (HCC)    Osteosarcoma, right leg  . CVA (cerebrovascular accident due to intracerebral hemorrhage) ([place]) 2004  . Depression   . DVT (deep venous thrombosis) (HCC)    Left leg  . MRSA (methicillin resistant Staphylococcus aureus)   . 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 Seizures ([place])   . Thyroid disease   . 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 ABOVE KNEE LEG AMPUTATION Right    Osteosarcoma  . BRAIN SURGERY  2004   cerebral aneurysm  . FRACTURE SURGERY     Rt. femur, folllowed by MRSA  . TOTAL KNEE ARTHROPLASTY Right     Family History  Problem Relation Age of Onset  . Anxiety disorder Mother   . Diabetes Father     Social history:  reports that she has never smoked. She has never used smokeless tobacco. She reports that she does not drink alcohol and does not use drugs.    Allergies  Allergen Reactions  . Vicodin [Hydrocodone-Acetaminophen] Nausea And Vomiting   . Morphine And Related Rash    Allergic reaction only in iv form    Medications:  Prior to Admission medications   Medication Sig Start Date End Date Taking? Authorizing Provider  acetaminophen (TYLENOL) 650 MG CR tablet Take 1,300 mg by mouth every 8 (eight) hours as needed for pain.   Yes [provider]  Ascorbic Acid (VITAMIN C) 1000 MG tablet Take 1,000 mg by mouth daily. 08/17/11  Yes [provider]  lamoTRIgine (LAMICTAL) 200 MG tablet TAKE 1 TABLET BY MOUTH TWICE A DAY Patient taking differently: Take 200 mg by mouth 2 (two) times daily. 06/07/19  Yes Suzzanne Cloud, NP  lamoTRIgine (LAMICTAL) 25 MG tablet TAKE 2 TABLETS BY MOUTH TWICE A DAY Patient taking differently: Take 50 mg by mouth 2 (two) times daily. 06/25/19  Yes Kathrynn Ducking, MD  levETIRAcetam (KEPPRA) 1000 MG tablet TAKE 2 TABLETS (2,000 MG TOTAL) BY MOUTH 2 (TWO) TIMES DAILY. 03/05/20  Yes Kathrynn Ducking, MD  levothyroxine (SYNTHROID, LEVOTHROID) 50 MCG tablet Take 50 mcg by mouth daily before breakfast.   Yes [provider]  midazolam (VERSED) 5 MG/ML injection Place 1 mL (5 mg total) into the nose once as needed for up to 1 dose. Draw up prescribed dose (ml) in syringe, remove blue vial access device, then attach syringe  to nasal atomizer for intranasal administration.  Use if needed for prolonged seizure greater than 5 minutes. 02/27/20  Yes Kathrynn Ducking, MD  Midazolam 5 MG/0.1ML SOLN Place 0.1 mLs into the nose once as needed for up to 1 dose (prolonged seizure). 02/27/20  Yes Kathrynn Ducking, MD  omeprazole (PRILOSEC) 20 MG capsule Take 20 mg by mouth daily. 06/12/19  Yes [provider]  Prenatal Vit-Fe Fumarate-FA (PRENATAL MULTIVITAMIN) TABS tablet Take 1 tablet by mouth daily at 12 noon.   Yes [provider]  promethazine (PHENERGAN) 25 MG tablet Take 25 mg by mouth every 8 (eight) hours as needed for nausea or vomiting. 04/27/19  Yes [provider]   topiramate (TOPAMAX) 25 MG tablet Take 1 tablet (25 mg total) by mouth 2 (two) times daily. 02/19/20  Yes Garvin Fila, MD  venlafaxine XR (EFFEXOR-XR) 150 MG 24 hr capsule Take 1 capsule (150 mg total) by mouth daily with breakfast. 02/04/20  Yes Arfeen, Arlyce Harman, MD  VIMPAT 200 MG TABS tablet TAKE 1 TABLET BY MOUTH TWICE A DAY Patient taking differently: Take 200 mg by mouth 2 (two) times daily. 12/25/19  Yes Suzzanne Cloud, NP  warfarin (COUMADIN) 5 MG tablet Take 1.5-2 tablets (7.5-10 mg total) by mouth See admin instructions. Pt takes one and a half tablets (7.5mg  total) on Sunday and Wednesday and 2 tablets (10mg  total) all other days. Patient taking differently: Take 7.5-10 mg by mouth See admin instructions. As per the instructions from coumadin clinic 01/03/18  Yes Thurnell Lose, MD    ROS:  Out of a complete 14 system review of symptoms, the patient complains only of the following symptoms, and all other reviewed systems are negative.  Seizures Walking difficulty  Blood pressure 128/87, pulse 96, height 5\' 7"  (1.702 m), weight 170 lb (77.1 kg).  Physical Exam  General: The patient is alert and cooperative at the time of the examination.  Skin: No significant peripheral edema is noted.  The patient has a right above-knee amputation.   Neurologic Exam  Mental status: The patient is alert and oriented x 3 at the time of the examination. The patient has apparent normal recent and remote memory, with an apparently normal attention span and concentration ability.   Cranial nerves: Facial symmetry is present. Speech is normal, no aphasia or dysarthria is noted.  On primary gaze, there is exotropia of the right eye.  There appears to be a left homonymous visual field deficit.  There is restriction of superior gaze with both eyes.  Motor: The patient has good strength in all 4 extremities.  Sensory examination: Soft touch sensation is symmetric on the face, arms, and  legs.  Coordination: The patient has good finger-nose-finger bilaterally.  Gait and station: The patient was not ambulated, she does not have a prosthesis with her today.  Reflexes: Deep tendon reflexes are symmetric.   Assessment/Plan:  1.  Intractable seizure disorder  The patient continues to have frequent seizures.  The vagal nerve stimulator was interrogated today and appears to be working well.  It was placed in 2015 and may only last another 2 years.  The settings were increased slightly.  The patient be sent for blood work today, will we may make some medication adjustments depend upon the results of the blood work.  She will follow up in 6 months.  Jill Alexanders MD 03/24/2020 12:57 PM  [facility] [address] [place], [place] [address]  Phone [phone] Fax [phone]  

## 2020-03-26 ENCOUNTER — Other Ambulatory Visit: Payer: Self-pay | Admitting: Neurology

## 2020-03-26 ENCOUNTER — Other Ambulatory Visit (HOSPITAL_COMMUNITY): Payer: Self-pay | Admitting: Psychiatry

## 2020-03-26 DIAGNOSIS — F419 Anxiety disorder, unspecified: Secondary | ICD-10-CM

## 2020-03-26 DIAGNOSIS — F321 Major depressive disorder, single episode, moderate: Secondary | ICD-10-CM

## 2020-03-28 ENCOUNTER — Telehealth: Payer: Self-pay | Admitting: Neurology

## 2020-03-28 LAB — COMPREHENSIVE METABOLIC PANEL
ALT: 24 IU/L (ref 0–32)
AST: 20 IU/L (ref 0–40)
Albumin/Globulin Ratio: 1.6 (ref 1.2–2.2)
Albumin: 4.6 g/dL (ref 3.8–4.9)
Alkaline Phosphatase: 112 IU/L (ref 44–121)
BUN/Creatinine Ratio: 16 (ref 9–23)
BUN: 17 mg/dL (ref 6–24)
Bilirubin Total: 0.2 mg/dL (ref 0.0–1.2)
CO2: 29 mmol/L (ref 20–29)
Calcium: 10 mg/dL (ref 8.7–10.2)
Chloride: 101 mmol/L (ref 96–106)
Creatinine, Ser: 1.06 mg/dL — ABNORMAL HIGH (ref 0.57–1.00)
GFR calc Af Amer: 69 mL/min/{1.73_m2} (ref >59)
GFR calc non Af Amer: 60 mL/min/{1.73_m2} (ref >59)
Globulin, Total: 2.8 g/dL (ref 1.5–4.5)
Glucose: 98 mg/dL (ref 65–99)
Potassium: 4.4 mmol/L (ref 3.5–5.2)
Sodium: 144 mmol/L (ref 134–144)
Total Protein: 7.4 g/dL (ref 6.0–8.5)

## 2020-03-28 LAB — CBC WITH DIFFERENTIAL/PLATELET
Basophils Absolute: 0 10*3/uL (ref 0.0–0.2)
Basos: 1 %
EOS (ABSOLUTE): 0.1 10*3/uL (ref 0.0–0.4)
Eos: 2 %
Hematocrit: 39 % (ref 34.0–46.6)
Hemoglobin: 12.3 g/dL (ref 11.1–15.9)
Immature Grans (Abs): 0 10*3/uL (ref 0.0–0.1)
Immature Granulocytes: 0 %
Lymphocytes Absolute: 2.1 10*3/uL (ref 0.7–3.1)
Lymphs: 46 %
MCH: 28.2 pg (ref 26.6–33.0)
MCHC: 31.5 g/dL (ref 31.5–35.7)
MCV: 89 fL (ref 79–97)
Monocytes Absolute: 0.3 10*3/uL (ref 0.1–0.9)
Monocytes: 7 %
Neutrophils Absolute: 2 10*3/uL (ref 1.4–7.0)
Neutrophils: 44 %
Platelets: 238 10*3/uL (ref 150–450)
RBC: 4.36 x10E6/uL (ref 3.77–5.28)
RDW: 12.5 % (ref 11.7–15.4)
WBC: 4.5 10*3/uL (ref 3.4–10.8)

## 2020-03-28 LAB — LEVETIRACETAM LEVEL: Levetiracetam Lvl: 94 ug/mL — ABNORMAL HIGH (ref 10.0–40.0)

## 2020-03-28 LAB — LAMOTRIGINE LEVEL: Lamotrigine Lvl: 8.8 ug/mL (ref 2.0–20.0)

## 2020-03-28 LAB — LACOSAMIDE: Lacosamide: 11.6 ug/mL — ABNORMAL HIGH (ref 5.0–10.0)

## 2020-03-28 MED ORDER — LEVETIRACETAM 1000 MG PO TABS: ORAL_TABLET | ORAL | 1 refills | Status: DC

## 2020-03-28 MED ORDER — LAMOTRIGINE 100 MG PO TABS: 100.0000 mg | ORAL_TABLET | Freq: Two times a day (BID) | ORAL | 1 refills | Status: DC

## 2020-03-28 NOTE — Telephone Encounter (Signed)
I called and talk with the mother.  The Keppra level somewhat elevated, the patient is having drowsiness on the medication when she takes it in the morning.  I will have her go to 1.5 of the 1000 mg tablets in the morning and 2 in the evening of the Keppra.  With the Lamictal, we can increase the dose, I will go to 300 mg twice daily.  She will stop the 25 mg tablets and take one of the 100 mg tablets and one of the 200 mg tablets twice daily.

## 2020-04-08 ENCOUNTER — Other Ambulatory Visit: Payer: Self-pay | Admitting: *Deleted

## 2020-04-08 NOTE — Telephone Encounter (Signed)
We received a request from a [facility] mail [facility] asking for Lamotrigine 25 mg Rx however this was marked as denied due to Dr [name] changing dose to 300 mg BID. Pt is to take one 200 mg and one 100 mg tablet twice daily. [facility] Received a receipt of confirmation.

## 2020-04-23 ENCOUNTER — Other Ambulatory Visit (HOSPITAL_COMMUNITY): Payer: Self-pay | Admitting: Psychiatry

## 2020-04-23 ENCOUNTER — Other Ambulatory Visit: Payer: Self-pay | Admitting: Neurology

## 2020-04-23 ENCOUNTER — Encounter (HOSPITAL_COMMUNITY): Payer: Self-pay | Admitting: Psychiatry

## 2020-04-23 ENCOUNTER — Telehealth (INDEPENDENT_AMBULATORY_CARE_PROVIDER_SITE_OTHER): Payer: Medicare Other | Admitting: Psychiatry

## 2020-04-23 ENCOUNTER — Other Ambulatory Visit: Payer: Self-pay

## 2020-04-23 DIAGNOSIS — F419 Anxiety disorder, unspecified: Secondary | ICD-10-CM | POA: Diagnosis not present

## 2020-04-23 DIAGNOSIS — F321 Major depressive disorder, single episode, moderate: Secondary | ICD-10-CM

## 2020-04-23 MED ORDER — VENLAFAXINE HCL ER 150 MG PO CP24: 150.0000 mg | ORAL_CAPSULE | Freq: Every day | ORAL | 0 refills | Status: DC

## 2020-04-23 NOTE — Progress Notes (Signed)
Virtual Visit via Telephone Note  I connected with Cassandra Andrade on 04/23/20 at 11:00 AM EST by telephone and verified that I am speaking with the correct person using two identifiers.  Location: Patient: Home Provider: Home Office   I discussed the limitations, risks, security and privacy concerns of performing an evaluation and management service by telephone and the availability of in person appointments. I also discussed with the patient that there may be a patient responsible charge related to this service. The patient expressed understanding and agreed to proceed.   History of Present Illness: Patient is evaluated by phone session.  Her mother is also present during the conversation.  Patient actually doing much better and she is using more and more prosthesis but he still requires some time motivation to do that.  She is happy because she is seeing her children and now needs regularly.  Patient told they are looking to buy a house but could not afford as they are expensive.  Overall she feels things are going well.  She told recently had a visit with neurology and she is taking seizure medicine.  She still have seizures but they does not last long and she has a better control of the seizures.  She does not want to change the medication since it is working well.  She denies any crying spells, feeling of hopelessness, panic attack or any suicidal thoughts.  She is in therapy with Janett Billow.  She is trying to lose weight and so far she has lost more than 15 pounds in past few months.  She is more focused on eating fruits and vegetables.  She is also more active.  Patient denies drinking or using any illegal substances.   Past Psychiatric History: H/O inpatientin 2011 due to overdose on pain medication.No h/omania or psychosis. Tried Zoloft and Remeron but do not remember. Took Lexapro and Abilify but stopped due to lack of response. Prozac worked for a long time until it stopped working.    Recent Results (from the past 2160 hour(s))  Basic metabolic panel     Status: Abnormal   Collection Time: 02/20/20  9:01 AM  Result Value Ref Range   Sodium 134 (L) 135 - 145 mmol/L   Potassium 3.3 (L) 3.5 - 5.1 mmol/L   Chloride 98 98 - 111 mmol/L   CO2 27 22 - 32 mmol/L   Glucose, Bld 96 70 - 99 mg/dL    Comment: Glucose reference range applies only to samples taken after fasting for at least 8 hours.   BUN 9 6 - 20 mg/dL   Creatinine, Ser 0.96 0.44 - 1.00 mg/dL   Calcium 9.2 8.9 - 10.3 mg/dL   GFR, Estimated >60 >60 mL/min    Comment: (NOTE) Calculated using the CKD-EPI Creatinine Equation (2021)    Anion gap 9 5 - 15    Comment: Performed at [facility] [address], [place], [place] [address]  CBC     Status: Abnormal   Collection Time: 02/20/20  9:01 AM  Result Value Ref Range   WBC 2.7 (L) 4.0 - 10.5 K/uL   RBC 4.17 3.87 - 5.11 MIL/uL   Hemoglobin 11.7 (L) 12.0 - 15.0 g/dL   HCT 37.3 36.0 - 46.0 %   MCV 89.4 80.0 - 100.0 fL   MCH 28.1 26.0 - 34.0 pg   MCHC 31.4 30.0 - 36.0 g/dL   RDW 13.0 11.5 - 15.5 %   Platelets 183 150 - 400 K/uL  nRBC 0.0 0.0 - 0.2 %    Comment: Performed at [facility], [place] [address], [place], [place] [address]  Comprehensive metabolic panel     Status: Abnormal   Collection Time: 02/27/20 11:00 AM  Result Value Ref Range   Sodium 141 135 - 145 mmol/L   Potassium 3.9 3.5 - 5.1 mmol/L   Chloride 100 98 - 111 mmol/L   CO2 30 22 - 32 mmol/L   Glucose, Bld 97 70 - 99 mg/dL    Comment: Glucose reference range applies only to samples taken after fasting for at least 8 hours.   BUN 17 6 - 20 mg/dL   Creatinine, Ser 1.17 (H) 0.44 - 1.00 mg/dL   Calcium 9.5 8.9 - 10.3 mg/dL   Total Protein 7.9 6.5 - 8.1 g/dL   Albumin 4.3 3.5 - 5.0 g/dL   AST 27 15 - 41 U/L   ALT 28 0 - 44 U/L   Alkaline Phosphatase 71 38 - 126 U/L   Total Bilirubin 0.5 0.3 - 1.2 mg/dL   GFR, Estimated 56 (L) >60 mL/min    Comment: (NOTE) Calculated using  the CKD-EPI Creatinine Equation (2021)    Anion gap 11 5 - 15    Comment: Performed at [facility], [place] [address] [place], [place] [address]  CBC with Differential     Status: Abnormal   Collection Time: 02/27/20 11:00 AM  Result Value Ref Range   WBC 3.0 (L) 4.0 - 10.5 K/uL   RBC 4.18 3.87 - 5.11 MIL/uL   Hemoglobin 12.2 12.0 - 15.0 g/dL   HCT 38.4 36.0 - 46.0 %   MCV 91.9 80.0 - 100.0 fL   MCH 29.2 26.0 - 34.0 pg   MCHC 31.8 30.0 - 36.0 g/dL   RDW 12.5 11.5 - 15.5 %   Platelets 239 150 - 400 K/uL   nRBC 0.0 0.0 - 0.2 %   Neutrophils Relative % 30 %   Neutro Abs 0.9 (L) 1.7 - 7.7 K/uL   Lymphocytes Relative 56 %   Lymphs Abs 1.7 0.7 - 4.0 K/uL   Monocytes Relative 11 %   Monocytes Absolute 0.3 0.1 - 1.0 K/uL   Eosinophils Relative 2 %   Eosinophils Absolute 0.1 0.0 - 0.5 K/uL   Basophils Relative 1 %   Basophils Absolute 0.0 0.0 - 0.1 K/uL   Immature Granulocytes 0 %   Abs Immature Granulocytes 0.00 0.00 - 0.07 K/uL   Reactive, Benign Lymphocytes PRESENT     Comment: Performed at [facility], [place] [address], [place], [place] [address]  Pathologist smear review     Status: None   Collection Time: 02/27/20 11:00 AM  Result Value Ref Range   Path Review Reviewed By Violet Baldy, M.D.     Comment: 1.6.2021 LEUKOPENIA WITH NEUTROPENIA Performed at [facility] [address] [place], [place] [address]   Urinalysis, Routine w reflex microscopic Urine, Clean Catch     Status: Abnormal   Collection Time: 02/27/20  1:37 PM  Result Value Ref Range   Color, Urine YELLOW YELLOW   APPearance CLEAR CLEAR   Specific Gravity, Urine 1.015 1.005 - 1.030   pH 6.0 5.0 - 8.0   Glucose, UA NEGATIVE NEGATIVE mg/dL   Hgb urine dipstick MODERATE (A) NEGATIVE   Bilirubin Urine NEGATIVE NEGATIVE   Ketones, ur NEGATIVE NEGATIVE mg/dL   Protein, ur NEGATIVE NEGATIVE mg/dL   Nitrite NEGATIVE NEGATIVE  Leukocytes,Ua MODERATE (A)  NEGATIVE   RBC / HPF 11-20 0 - 5 RBC/hpf   WBC, UA 11-20 0 - 5 WBC/hpf   Bacteria, UA RARE (A) NONE SEEN   Squamous Epithelial / LPF 0-5 0 - 5    Comment: Performed at [facility], [place] [address] [place], [place] [address]  Urine culture     Status: Abnormal   Collection Time: 02/27/20  1:37 PM   Specimen: Urine, Catheterized  Result Value Ref Range   Specimen Description      URINE, CATHETERIZED Performed at [facility] [address], [place], [place] [address]    Special Requests      NONE Performed at [facility], [place] [address], [place], [place] [address]    Culture MULTIPLE SPECIES PRESENT, SUGGEST RECOLLECTION (A)    Report Status 02/29/2020 FINAL   Comprehensive metabolic panel     Status: Abnormal   Collection Time: 03/24/20  1:24 PM  Result Value Ref Range   Glucose 98 65 - 99 mg/dL   BUN 17 6 - 24 mg/dL   Creatinine, Ser 1.06 (H) 0.57 - 1.00 mg/dL   GFR calc non Af Amer 60 >59 mL/min/1.73   GFR calc Af Amer 69 >59 mL/min/1.73    Comment: **In accordance with recommendations from the NKF-ASN Task force,**   [facility] is in the process of updating its eGFR calculation to the   2021 CKD-EPI creatinine equation that estimates kidney function   without a race variable.    BUN/Creatinine Ratio 16 9 - 23   Sodium 144 134 - 144 mmol/L   Potassium 4.4 3.5 - 5.2 mmol/L   Chloride 101 96 - 106 mmol/L   CO2 29 20 - 29 mmol/L   Calcium 10.0 8.7 - 10.2 mg/dL   Total Protein 7.4 6.0 - 8.5 g/dL   Albumin 4.6 3.8 - 4.9 g/dL   Globulin, Total 2.8 1.5 - 4.5 g/dL   Albumin/Globulin Ratio 1.6 1.2 - 2.2   Bilirubin Total 0.2 0.0 - 1.2 mg/dL   Alkaline Phosphatase 112 44 - 121 IU/L   AST 20 0 - 40 IU/L   ALT 24 0 - 32 IU/L  CBC with Differential/Platelet     Status: None   Collection Time: 03/24/20  1:24 PM  Result Value Ref Range   WBC 4.5 3.4 - 10.8 x10E3/uL   RBC 4.36 3.77 - 5.28 x10E6/uL   Hemoglobin 12.3 11.1 -  15.9 g/dL   Hematocrit 39.0 34.0 - 46.6 %   MCV 89 79 - 97 fL   MCH 28.2 26.6 - 33.0 pg   MCHC 31.5 31.5 - 35.7 g/dL   RDW 12.5 11.7 - 15.4 %   Platelets 238 150 - 450 x10E3/uL   Neutrophils 44 Not Estab. %   Lymphs 46 Not Estab. %   Monocytes 7 Not Estab. %   Eos 2 Not Estab. %   Basos 1 Not Estab. %   Neutrophils Absolute 2.0 1.4 - 7.0 x10E3/uL   Lymphocytes Absolute 2.1 0.7 - 3.1 x10E3/uL   Monocytes Absolute 0.3 0.1 - 0.9 x10E3/uL   EOS (ABSOLUTE) 0.1 0.0 - 0.4 x10E3/uL   Basophils Absolute 0.0 0.0 - 0.2 x10E3/uL   Immature Granulocytes 0 Not Estab. %   Immature Grans (Abs) 0.0 0.0 - 0.1 x10E3/uL  Lacosamide     Status: Abnormal   Collection Time: 03/24/20  1:24 PM  Result Value Ref Range   Lacosamide 11.6 (H)  5.0 - 10.0 ug/mL    Comment: This test was developed and its performance characteristics determined by [facility]. It has not been cleared or approved by the Food and Drug Administration.                                  Limit of Detection 0.5   Mean plasma concentrations following maintenance dose                         200 mg/day  4.99 +/- 2.51 ug/mL                         400 mg/day  9.35 +/- 4.22 ug/mL                         600 mg/day 12.46 +/- 5.60 ug/mL   Levetiracetam level     Status: Abnormal   Collection Time: 03/24/20  1:24 PM  Result Value Ref Range   Levetiracetam Lvl 94.0 (H) 10.0 - 40.0 ug/mL    Comment: This test was developed and its performance characteristics determined by [facility]. It has not been cleared or approved by the Food and Drug Administration.   Lamotrigine level     Status: None   Collection Time: 03/24/20  1:24 PM  Result Value Ref Range   Lamotrigine Lvl 8.8 2.0 - 20.0 ug/mL    Comment:                                 Detection Limit = 1.0    Psychiatric Specialty Exam: Physical Exam  Review of Systems  Weight 150 lb (68 kg).There is no height or weight on file to calculate BMI.  General Appearance: NA  Eye Contact:  NA   Speech:  Normal Rate  Volume:  Normal  Mood:  Euthymic  Affect:  NA  Thought Process:  Goal Directed  Orientation:  Full (Time, Place, and Person)  Thought Content:  WDL  Suicidal Thoughts:  No  Homicidal Thoughts:  No  Memory:  Immediate;   Good Recent;   Fair Remote;   Fair  Judgement:  Intact  Insight:  Present  Psychomotor Activity:  NA  Concentration:  Concentration: Fair and Attention Span: Fair  Recall:  [employer] of Knowledge:  Fair  Language:  Good  Akathisia:  No  Handed:  Right  AIMS (if indicated):     Assets:  Communication Skills Desire for Improvement Housing Resilience Social Support  ADL's:  Intact  Cognition:  WNL  Sleep:   ok      Assessment and Plan: Major depressive disorder, recurrent.  Anxiety.  Patient reported her anxiety and depression is a stable.  She does not need to change the medication.  I reviewed blood work results.  Her creatinine is 1.1.  Continue Effexor 150 mg daily.  Encouraged to continue therapy with Janett Billow.  Recommended to call us back if she has any question or any concern.  Follow-up in 3 months.  Follow Up Instructions:    I discussed the assessment and treatment plan with the patient. The patient was provided an opportunity to ask questions and all were answered. The patient agreed with the plan and demonstrated an understanding of the instructions.   The patient  was advised to call back or seek an in-person evaluation if the symptoms worsen or if the condition fails to improve as anticipated.  I provided 16 minutes of non-face-to-face time during this encounter.   Kathlee Nations, MD

## 2020-04-24 ENCOUNTER — Ambulatory Visit: Payer: Medicare Other | Admitting: Neurology

## 2020-04-30 ENCOUNTER — Telehealth (HOSPITAL_COMMUNITY): Payer: Medicare Other | Admitting: Psychiatry

## 2020-05-01 ENCOUNTER — Other Ambulatory Visit: Payer: Self-pay

## 2020-05-01 ENCOUNTER — Encounter (HOSPITAL_COMMUNITY): Payer: Self-pay | Admitting: Licensed Clinical Social Worker

## 2020-05-01 ENCOUNTER — Ambulatory Visit (INDEPENDENT_AMBULATORY_CARE_PROVIDER_SITE_OTHER): Payer: Medicare Other | Admitting: Licensed Clinical Social Worker

## 2020-05-01 DIAGNOSIS — F330 Major depressive disorder, recurrent, mild: Secondary | ICD-10-CM | POA: Diagnosis not present

## 2020-05-01 NOTE — Progress Notes (Signed)
Virtual Visit via Telephone Note  I connected with Cassandra Andrade on 05/01/20 at  3:30 PM EST by telephone and verified that I am speaking with the correct person using two identifiers.  Location: Patient: home Provider: home office   I discussed the limitations, risks, security and privacy concerns of performing an evaluation and management service by telephone and the availability of in person appointments. I also discussed with the patient that there may be a patient responsible charge related to this service. The patient expressed understanding and agreed to proceed.  Type of Therapy: Individual Therapy   Treatment Goals addressed:"I want to maintain my happy mood, positive outlook on life, and to stay motivated to walk". Cassandra Andrade will report ongoing positive mood and positive outlook on life 6 out of 7 days per self report.  Interventions: Motivational Interviewing   Summary: Cassandra Andrade is a 55 y.o. female who presents with Major Depressive Disorder, recurrent, mild   Suicidal/Homicidal: No -without intent/plan   Therapist Response: Cassandra Andrade met with clinician for an individual session. Satonya discussed her psychiatric symptoms, her current life events and her homework. Cassandra Andrade reports that she continues to do well emotionally. She reports that she continues to focus on her walking, which is hard, but she feels motivated to do it. Clinician processed her typical attitude and noted mainly positive self talk, motivation to be successful in her goals, and a great deal of support from her mother and 3 sons. Clinician reflected the importance of a close family. Clinician provided some feedback about mindfulness and taking time for herself, rather than rushing. Clinician validated ways to "change the channel" when her mind tells her negative things. Clinician also encouraged Cassandra Andrade to use positive self affirmations, when she feels good and when she is feeling less motivated.    Plan: Return again in  4 weeks.   Diagnosis: Axis I: Major Depressive Disorder, recurrent, mild     I discussed the assessment and treatment plan with the patient. The patient was provided an opportunity to ask questions and all were answered. The patient agreed with the plan and demonstrated an understanding of the instructions.   The patient was advised to call back or seek an in-person evaluation if the symptoms worsen or if the condition fails to improve as anticipated.  I provided 45 minutes of non-face-to-face time during this encounter.   [name], LCSW

## 2020-05-26 ENCOUNTER — Other Ambulatory Visit: Payer: Self-pay | Admitting: Neurology

## 2020-05-26 ENCOUNTER — Other Ambulatory Visit (HOSPITAL_COMMUNITY): Payer: Self-pay | Admitting: Psychiatry

## 2020-05-26 DIAGNOSIS — F419 Anxiety disorder, unspecified: Secondary | ICD-10-CM

## 2020-05-26 DIAGNOSIS — F321 Major depressive disorder, single episode, moderate: Secondary | ICD-10-CM

## 2020-06-05 ENCOUNTER — Other Ambulatory Visit: Payer: Self-pay

## 2020-06-05 ENCOUNTER — Ambulatory Visit (INDEPENDENT_AMBULATORY_CARE_PROVIDER_SITE_OTHER): Payer: Medicare Other | Admitting: Licensed Clinical Social Worker

## 2020-06-05 DIAGNOSIS — F330 Major depressive disorder, recurrent, mild: Secondary | ICD-10-CM

## 2020-06-09 ENCOUNTER — Encounter (HOSPITAL_COMMUNITY): Payer: Self-pay | Admitting: Licensed Clinical Social Worker

## 2020-06-09 NOTE — Progress Notes (Signed)
Virtual Visit via Telephone Note  I connected with Aaria Elbaum on 06/09/20 at  2:30 PM EDT by telephone and verified that I am speaking with the correct person using two identifiers.  Location: Patient: home Provider: home office   I discussed the limitations, risks, security and privacy concerns of performing an evaluation and management service by telephone and the availability of in person appointments. I also discussed with the patient that there may be a patient responsible charge related to this service. The patient expressed understanding and agreed to proceed.    Type of Therapy: Individual Therapy   Treatment Goals addressed:"I want to maintain my happy mood, positive outlook on life, and to stay motivated to walk". Raechel will report ongoing positive mood and positive outlook on life 6 out of 7 days per self report.   Interventions: Motivational Interviewing   Summary: Alayha Babineaux is a 55 y.o. female who presents with Major Depressive Disorder, recurrent, mild   Suicidal/Homicidal: No -without intent/plan   Therapist Response: Denene met with clinician for an individual session. Tkeyah discussed her psychiatric symptoms, her current life events and her homework. Wilena reports that things have been going well. She shared that she continues to encourage herself to practice walking daily with her leg and to try to take some steps without a walker. Clinician reflected how the use of positive self talk, without any negativity or frustration is helpful. Clinician also identified the importance of taking breaks and recognizing progress. Clinician explored relationship with mother, who is Emanuelle's full-time caregiver. Yentl shared that things are going well, but she is trying to do more for herself so mom does not become burned out. Clinician encouraged Yanisa to work on her communication skills with mom to show appreciation and to explore any options for respite or assistance if needed.      Plan: Return again in 4 weeks.   Diagnosis: Axis I: Major Depressive Disorder, recurrent, mild     I discussed the assessment and treatment plan with the patient. The patient was provided an opportunity to ask questions and all were answered. The patient agreed with the plan and demonstrated an understanding of the instructions.   The patient was advised to call back or seek an in-person evaluation if the symptoms worsen or if the condition fails to improve as anticipated.  I provided 45 minutes of non-face-to-face time during this encounter.   [name], LCSW

## 2020-06-16 ENCOUNTER — Other Ambulatory Visit: Payer: Self-pay | Admitting: Neurology

## 2020-06-16 ENCOUNTER — Other Ambulatory Visit (HOSPITAL_COMMUNITY): Payer: Self-pay | Admitting: Psychiatry

## 2020-06-16 DIAGNOSIS — F321 Major depressive disorder, single episode, moderate: Secondary | ICD-10-CM

## 2020-06-16 DIAGNOSIS — F419 Anxiety disorder, unspecified: Secondary | ICD-10-CM

## 2020-06-17 ENCOUNTER — Telehealth: Payer: Self-pay | Admitting: Neurology

## 2020-06-17 NOTE — Telephone Encounter (Signed)
I called the mother, the patient confirmed that she is off of the Topamax, I will deny the refill request for Topamax.

## 2020-06-26 ENCOUNTER — Other Ambulatory Visit: Payer: Self-pay | Admitting: Neurology

## 2020-06-30 ENCOUNTER — Other Ambulatory Visit: Payer: Self-pay | Admitting: Neurology

## 2020-06-30 ENCOUNTER — Telehealth: Payer: Self-pay | Admitting: Neurology

## 2020-06-30 MED ORDER — LAMOTRIGINE 100 MG PO TABS: 100.0000 mg | ORAL_TABLET | Freq: Two times a day (BID) | ORAL | 1 refills | Status: DC

## 2020-06-30 NOTE — Telephone Encounter (Signed)
Pt's mother, Ronita Hipps (on [place]) called, need to discuss her prescription for lamoTRIgine (LAMICTAL) 100 MG tablet. Please call me, she is out of medication.

## 2020-06-30 NOTE — Telephone Encounter (Signed)
I called the patient.  The patient is taking lamotrigine 200 mg twice daily and then the 100 mg Lamictal twice daily for total of 300 mg twice daily.  She is no longer on the 25 mg Lamictal tablets, this was changed to the 100 mg tablets.  She claims that she needs another prescription for the 100 mg Lamictal, I will send this in.  I records however indicate that she should have enough 100 mg Lamictal to last until August.

## 2020-07-03 ENCOUNTER — Other Ambulatory Visit: Payer: Self-pay | Admitting: Neurology

## 2020-07-03 ENCOUNTER — Other Ambulatory Visit: Payer: Self-pay

## 2020-07-03 ENCOUNTER — Ambulatory Visit (INDEPENDENT_AMBULATORY_CARE_PROVIDER_SITE_OTHER): Payer: Medicare Other | Admitting: Licensed Clinical Social Worker

## 2020-07-03 ENCOUNTER — Encounter (HOSPITAL_COMMUNITY): Payer: Self-pay | Admitting: Licensed Clinical Social Worker

## 2020-07-03 DIAGNOSIS — F330 Major depressive disorder, recurrent, mild: Secondary | ICD-10-CM | POA: Diagnosis not present

## 2020-07-03 MED ORDER — LACOSAMIDE 200 MG PO TABS: 200.0000 mg | ORAL_TABLET | Freq: Two times a day (BID) | ORAL | 2 refills | Status: DC

## 2020-07-03 NOTE — Telephone Encounter (Signed)
Pt is requesting a refill for VIMPAT 200 MG TABS tablet .  [facility]: [id number]

## 2020-07-03 NOTE — Progress Notes (Signed)
Virtual Visit via Telephone Note  I connected with Jacey Lalani on 07/03/20 at  2:30 PM EDT by telephone and verified that I am speaking with the correct person using two identifiers.  Location: Patient: home Provider: home office   I discussed the limitations, risks, security and privacy concerns of performing an evaluation and management service by telephone and the availability of in person appointments. I also discussed with the patient that there may be a patient responsible charge related to this service. The patient expressed understanding and agreed to proceed.   Type of Therapy: Individual Therapy   Treatment Goals addressed:"I want to maintain my happy mood, positive outlook on life, and to stay motivated to walk". Kiahna will report ongoing positive mood and positive outlook on life 6 out of 7 days per self report.   Interventions: Motivational Interviewing   Summary: Shaney Lal is a 55 y.o. female who presents with Major Depressive Disorder, recurrent, mild   Suicidal/Homicidal: No -without intent/plan   Therapist Response: Pauletta met with clinician for an individual session. Britany discussed her psychiatric symptoms, her current life events and her homework. Amry reports that she has been doing well. She reports she has been working on walking, having taken 4 steps without a cane yesterday. Clinician celebrated Monta's progress and encouraged her to keep trying. Clinician also identified the importance of having positive self talk, to not get discouraged if she does less on another day. Clinician reflected the importance of having her internal cheerleader, which can help her on good days and not great days.    Plan: Return again in 4 weeks.   Diagnosis: Axis I: Major Depressive Disorder, recurrent, mild      I discussed the assessment and treatment plan with the patient. The patient was provided an opportunity to ask questions and all were answered. The patient agreed with  the plan and demonstrated an understanding of the instructions.   The patient was advised to call back or seek an in-person evaluation if the symptoms worsen or if the condition fails to improve as anticipated.  I provided 45 minutes of non-face-to-face time during this encounter.    R , LCSW  

## 2020-07-03 NOTE — Addendum Note (Signed)
Addended by: Rhae Lerner R on: 07/03/2020 02:33 PM   Modules accepted: Orders

## 2020-07-08 ENCOUNTER — Other Ambulatory Visit (HOSPITAL_COMMUNITY): Payer: Self-pay | Admitting: Psychiatry

## 2020-07-08 DIAGNOSIS — F321 Major depressive disorder, single episode, moderate: Secondary | ICD-10-CM

## 2020-07-08 DIAGNOSIS — F419 Anxiety disorder, unspecified: Secondary | ICD-10-CM

## 2020-07-16 ENCOUNTER — Other Ambulatory Visit: Payer: Self-pay | Admitting: Neurology

## 2020-07-24 ENCOUNTER — Telehealth (INDEPENDENT_AMBULATORY_CARE_PROVIDER_SITE_OTHER): Payer: Medicare Other | Admitting: Psychiatry

## 2020-07-24 ENCOUNTER — Telehealth (HOSPITAL_COMMUNITY): Payer: Medicare Other | Admitting: Psychiatry

## 2020-07-24 ENCOUNTER — Other Ambulatory Visit: Payer: Self-pay

## 2020-07-24 ENCOUNTER — Encounter (HOSPITAL_COMMUNITY): Payer: Self-pay | Admitting: Psychiatry

## 2020-07-24 ENCOUNTER — Other Ambulatory Visit: Payer: Self-pay | Admitting: Neurology

## 2020-07-24 DIAGNOSIS — F321 Major depressive disorder, single episode, moderate: Secondary | ICD-10-CM | POA: Diagnosis not present

## 2020-07-24 DIAGNOSIS — F419 Anxiety disorder, unspecified: Secondary | ICD-10-CM | POA: Diagnosis not present

## 2020-07-24 MED ORDER — VENLAFAXINE HCL ER 150 MG PO CP24: 150.0000 mg | ORAL_CAPSULE | Freq: Every day | ORAL | 0 refills | Status: DC

## 2020-07-24 NOTE — Progress Notes (Signed)
Virtual Visit via Telephone Note  I connected with Cassandra Andrade on 07/24/20 at 10:40 AM EDT by telephone and verified that I am speaking with the correct person using two identifiers.  Location: Patient: Home Provider: Home Office   I discussed the limitations, risks, security and privacy concerns of performing an evaluation and management service by telephone and the availability of in person appointments. I also discussed with the patient that there may be a patient responsible charge related to this service. The patient expressed understanding and agreed to proceed.   History of Present Illness: Patient is evaluated by phone session.  She admitted sometimes getting nervous and anxious and she feels that she cannot walk as much but she is also happy that she is trying and making baby steps to use prosthesis every day.  She is looking to buy a house and trying to see the property every time so she can come out of the house.  Overall she feels things are going well.  She denies any crying spells.  She is in therapy with Janett Billow and that helps her a lot.  She had talked to her mother about having a pet but mother is reluctant as there is a lot of work need to be done to keep the dog.  She is sleeping okay.  She started physical therapy recently to help her strength in the leg.  Her appetite is okay.  She denies any suicidal thoughts or any feeling of hopelessness.  She is taking venlafaxine regularly.  Recently she had a blood work on May 8 and her hemoglobin A1c is 5.6, creatinine 1.05.  Past Psychiatric History: H/O inpatientin 2011 due to overdose on pain medication.No h/omania or psychosis. Tried Zoloft and Remeron but do not remember. Took Lexapro and Abilify but stopped due to lack of response. Prozac worked for a long time until it stopped working.  No results found for this or any previous visit (from the past 2160 hour(s)).   Psychiatric Specialty Exam: Physical Exam  Review of  Systems  There were no vitals taken for this visit.There is no height or weight on file to calculate BMI.  General Appearance: NA  Eye Contact:  NA  Speech:  Normal Rate  Volume:  Normal  Mood:  Euthymic  Affect:  NA  Thought Process:  Descriptions of Associations: Circumstantial  Orientation:  Full (Time, Place, and Person)  Thought Content:  Logical  Suicidal Thoughts:  No  Homicidal Thoughts:  No  Memory:  Immediate;   Fair Recent;   Fair Remote;   Fair  Judgement:  Intact  Insight:  Present  Psychomotor Activity:  NA  Concentration:  Concentration: Fair and Attention Span: Fair  Recall:  [employer] of Knowledge:  Fair  Language:  Good  Akathisia:  No  Handed:  Right  AIMS (if indicated):     Assets:  Communication Skills Desire for Improvement Housing Social Support  ADL's:  Intact  Cognition:  WNL  Sleep:   ok      Assessment and Plan: Major depressive disorder, recurrent.  Anxiety.  Patient anxiety and depression is a stable on venlafaxine.  I reviewed blood work results.  Her last hemoglobin A1c is 5.6 and creatinine 1.05.  Encourage to keep appointment with the neurologist as patient still have struggle controlling her seizures.  Encouraged to keep appointment with Janett Billow.  Recommended to call us back if she has any question or any concern.  Follow-up in 3 months.  Follow  Up Instructions:    I discussed the assessment and treatment plan with the patient. The patient was provided an opportunity to ask questions and all were answered. The patient agreed with the plan and demonstrated an understanding of the instructions.   The patient was advised to call back or seek an in-person evaluation if the symptoms worsen or if the condition fails to improve as anticipated.  I provided 16 minutes of non-face-to-face time during this encounter.   Kathlee Nations, MD

## 2020-07-27 ENCOUNTER — Other Ambulatory Visit (HOSPITAL_COMMUNITY): Payer: Self-pay | Admitting: Psychiatry

## 2020-07-27 DIAGNOSIS — F419 Anxiety disorder, unspecified: Secondary | ICD-10-CM

## 2020-07-27 DIAGNOSIS — F321 Major depressive disorder, single episode, moderate: Secondary | ICD-10-CM

## 2020-08-07 ENCOUNTER — Other Ambulatory Visit: Payer: Self-pay

## 2020-08-07 ENCOUNTER — Ambulatory Visit (HOSPITAL_COMMUNITY): Payer: Medicare Other | Admitting: Licensed Clinical Social Worker

## 2020-08-11 ENCOUNTER — Ambulatory Visit: Payer: Self-pay | Admitting: Neurology

## 2020-08-19 ENCOUNTER — Other Ambulatory Visit: Payer: Self-pay

## 2020-08-19 ENCOUNTER — Emergency Department (HOSPITAL_COMMUNITY): Payer: Medicare Other

## 2020-08-19 ENCOUNTER — Emergency Department (HOSPITAL_COMMUNITY)
Admission: EM | Admit: 2020-08-19 | Discharge: 2020-08-19 | Disposition: A | Discharge status: Home / Self Care | Payer: Medicare Other | Attending: Emergency Medicine | Admitting: Emergency Medicine

## 2020-08-19 ENCOUNTER — Encounter (HOSPITAL_COMMUNITY): Payer: Self-pay

## 2020-08-19 DIAGNOSIS — Z791 Long term (current) use of non-steroidal anti-inflammatories (NSAID): Secondary | ICD-10-CM | POA: Insufficient documentation

## 2020-08-19 DIAGNOSIS — Z96651 Presence of right artificial knee joint: Secondary | ICD-10-CM | POA: Diagnosis not present

## 2020-08-19 DIAGNOSIS — G40909 Epilepsy, unspecified, not intractable, without status epilepticus: Secondary | ICD-10-CM | POA: Diagnosis not present

## 2020-08-19 DIAGNOSIS — R471 Dysarthria and anarthria: Secondary | ICD-10-CM

## 2020-08-19 DIAGNOSIS — Z89511 Acquired absence of right leg below knee: Secondary | ICD-10-CM | POA: Insufficient documentation

## 2020-08-19 LAB — CBC WITH DIFFERENTIAL/PLATELET
Abs Immature Granulocytes: 0.01 10*3/uL (ref 0.00–0.07)
Basophils Absolute: 0 10*3/uL (ref 0.0–0.1)
Basophils Relative: 1 %
Eosinophils Absolute: 0.1 10*3/uL (ref 0.0–0.5)
Eosinophils Relative: 3 %
HCT: 38.4 % (ref 36.0–46.0)
Hemoglobin: 12.2 g/dL (ref 12.0–15.0)
Immature Granulocytes: 0 %
Lymphocytes Relative: 49 %
Lymphs Abs: 1.9 10*3/uL (ref 0.7–4.0)
MCH: 28.9 pg (ref 26.0–34.0)
MCHC: 31.8 g/dL (ref 30.0–36.0)
MCV: 91 fL (ref 80.0–100.0)
Monocytes Absolute: 0.3 10*3/uL (ref 0.1–1.0)
Monocytes Relative: 7 %
Neutro Abs: 1.6 10*3/uL — ABNORMAL LOW (ref 1.7–7.7)
Neutrophils Relative %: 40 %
Platelets: 223 10*3/uL (ref 150–400)
RBC: 4.22 MIL/uL (ref 3.87–5.11)
RDW: 13.5 % (ref 11.5–15.5)
WBC: 3.9 10*3/uL — ABNORMAL LOW (ref 4.0–10.5)
nRBC: 0 % (ref 0.0–0.2)

## 2020-08-19 LAB — URINALYSIS, ROUTINE W REFLEX MICROSCOPIC
Bacteria, UA: NONE SEEN
Bilirubin Urine: NEGATIVE
Glucose, UA: NEGATIVE mg/dL
Ketones, ur: NEGATIVE mg/dL
Nitrite: NEGATIVE
Protein, ur: NEGATIVE mg/dL
Specific Gravity, Urine: 1.015 (ref 1.005–1.030)
pH: 6 (ref 5.0–8.0)

## 2020-08-19 LAB — COMPREHENSIVE METABOLIC PANEL
ALT: 44 U/L (ref 0–44)
AST: 34 U/L (ref 15–41)
Albumin: 4.4 g/dL (ref 3.5–5.0)
Alkaline Phosphatase: 86 U/L (ref 38–126)
Anion gap: 7 (ref 5–15)
BUN: 20 mg/dL (ref 6–20)
CO2: 29 mmol/L (ref 22–32)
Calcium: 9.5 mg/dL (ref 8.9–10.3)
Chloride: 103 mmol/L (ref 98–111)
Creatinine, Ser: 0.94 mg/dL (ref 0.44–1.00)
GFR, Estimated: >60 mL/min (ref >60)
Glucose, Bld: 84 mg/dL (ref 70–99)
Potassium: 4.4 mmol/L (ref 3.5–5.1)
Sodium: 139 mmol/L (ref 135–145)
Total Bilirubin: 0.6 mg/dL (ref 0.3–1.2)
Total Protein: 7.7 g/dL (ref 6.5–8.1)

## 2020-08-19 MED ORDER — CLOBAZAM 10 MG PO TABS: 5.0000 mg | ORAL_TABLET | Freq: Every day | ORAL | 0 refills | Status: DC

## 2020-08-19 MED ORDER — ACETAMINOPHEN 325 MG PO TABS
650.0000 mg | ORAL_TABLET | Freq: Once | ORAL | Status: AC
  Administered 2020-08-19: 650 mg via ORAL
  Filled 2020-08-19: qty 2

## 2020-08-19 MED ORDER — ONDANSETRON 4 MG PO TBDP
4.0000 mg | ORAL_TABLET | Freq: Once | ORAL | Status: AC
  Administered 2020-08-19: 4 mg via ORAL
  Filled 2020-08-19: qty 1

## 2020-08-19 NOTE — Discharge Instructions (Signed)
Please read and follow all provided instructions.  Your diagnoses today include:  1. Dysarthria   2. Seizure disorder ([place])     Tests performed today include: CT scan of the head -shows old stroke but no new problems Blood counts and electrolytes -no concerning problems Vital signs. See below for your results today.   Medications prescribed:  Onfi -additional medication to try to help prevent seizure  Take any prescribed medications only as directed.  Home care instructions:  Follow any educational materials contained in this packet.  BE VERY CAREFUL not to take multiple medicines containing Tylenol (also called acetaminophen). Doing so can lead to an overdose which can damage your liver and cause liver failure and possibly death.   Follow-up instructions: Call Dr. Tobey Grim office later today or tomorrow morning to schedule a follow-up appointment in 1 to 2 weeks.  Return instructions:  Please return to the Emergency Department if you experience worsening symptoms.  Return if you have weakness in your arms or legs, slurred speech, trouble walking or talking, confusion, or trouble with your balance.  Please return if you have any other emergent concerns.  Additional Information:  Your vital signs today were: BP 118/67   Pulse 77   Temp 97.9 F (36.6 C) (Oral)   Resp 16   SpO2 98%  If your blood pressure (BP) was elevated above 135/85 this visit, please have this repeated by your doctor within one month. --------------

## 2020-08-19 NOTE — ED Notes (Signed)
Cassandra Andrade was placed on patient. There were no wheelchairs available to take patient to the restroom.

## 2020-08-19 NOTE — ED Notes (Signed)
Patient was assisted to the restroom using a wheelchair.

## 2020-08-19 NOTE — ED Provider Notes (Signed)
[facility] DEPT Provider Note   CSN: [id number] Arrival date & time: 08/19/20  6270     History Chief Complaint  Patient presents with   Seizures    Cassandra Andrade is a 55 y.o. female.  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  Per EMS report, patient had talked to possible seizures this morning but was conscious throughout.  She is currently taking Lamictal.  Per telephone note noted in epic, family called provider in [place] this AM.    Per that note: "Pt mom called to report pt was slurring words yesterday. Stated she was slurring again after some sleep and exhibited signs of jitteriness. She was concerned pt was having a stroke. ... Spoke to the patients mom and she states this has been going on for about 3 days and that she has been going to bed early, but seems to be ok now and wanted an appt here. I told mom she needed to call 911 and she was ok with this."  Patient reports feeling little bit weaker than normal today.  She denies which she thinks is a seizure.  She reports nausea and vomiting but this was about a week ago.  Mother presented stating that yesterday the patient's speech was garbled and slurred for about 15 to 20 minutes.  She took and when she woke up she was back to her baseline.  This morning the symptoms started again and have since resolved and patient is currently at baseline.  She called her doctor and was referred to the emergency department today.      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[place])    Right    Seizures ([place])    Thyroid disease    Unspecified epilepsy with intractable epilepsy 02/28/2013    Patient Active Problem List   Diagnosis Date Noted   History of deep venous thrombosis 05/05/2015   Personal history of osteosarcoma 05/05/2015   Anemia 11/04/2014   Abnormal finding on mammography 05/18/2014   Major depression, recurrent ([place]) 03/19/2014   Vision disturbance 02/28/2014   Postprocedural state 12/31/2013   History of biliary T-tube placement 12/31/2013   Lupus anticoagulant disorder ([place]) 12/20/2013   Back pain, chronic 11/07/2013   Absence of bladder continence 08/05/2013   History of anticoagulant therapy 07/17/2013   Long term current use of anticoagulant therapy 07/17/2013   Partial epilepsy with impairment of consciousness ([place]) 06/21/2013   Intractable epilepsy ([place]) 02/28/2013   Absolute anemia 02/28/2013   Seizures ([place]) 02/21/2013   Seizure ([place]) 02/17/2013   DVT, bilateral lower limbs ([place]) 11/07/2012   Hypothyroid 07/04/2012   Seizure disorder ([place]) 07/04/2012   Chronic pain syndrome 07/04/2012   S/P BKA (below knee amputation) ([place]) 07/04/2012   Chronic pain associated with significant psychosocial dysfunction 07/04/2012   Cerebrovascular accident, late effects 06/05/2012   Status post above knee amputation 06/08/2011   Mechanical complication of graft of bone, cartilage, muscle or tendon 05/10/2011  Depression 05/03/2011   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 femur, folllowed by MRSA   TOTAL KNEE ARTHROPLASTY Right      OB History   No obstetric history on file.     Family History  Problem Relation Age of [place]   Anxiety disorder Mother     Diabetes Father     Social History   Tobacco Use   Smoking status: Never   Smokeless tobacco: Never   Tobacco comments:    never used tobacco  Vaping Use   Vaping Use: Never used  Substance Use Topics   Alcohol use: No    Alcohol/week: 0.0 standard drinks   Drug use: No    Home Medications Prior to Admission medications   Medication Sig Start Date End Date Taking? Authorizing Provider  acetaminophen (TYLENOL) 650 MG CR tablet Take 1,300 mg by mouth every 8 (eight) hours as needed for pain.    [provider]  Ascorbic Acid (VITAMIN C) 1000 MG tablet Take 1,000 mg by mouth daily. 08/17/11   [provider]  lacosamide (VIMPAT) 200 MG TABS tablet Take 1 tablet (200 mg total) by mouth 2 (two) times daily. 07/03/20   Kathrynn Ducking, MD  lamoTRIgine (LAMICTAL) 100 MG tablet Take 1 tablet (100 mg total) by mouth 2 (two) times daily. 06/30/20   Kathrynn Ducking, MD  lamoTRIgine (LAMICTAL) 200 MG tablet TAKE 1 TABLET BY MOUTH TWICE A DAY 06/17/20   Suzzanne Cloud, NP  levETIRAcetam (KEPPRA) 1000 MG tablet 1.5 tablets in the morning, 2 in the evening 03/28/20   Kathrynn Ducking, MD  levothyroxine (SYNTHROID, LEVOTHROID) 50 MCG tablet Take 50 mcg by mouth daily before breakfast.    [provider]  midazolam (VERSED) 5 MG/ML injection Place 1 mL (5 mg total) into the nose once as needed for up to 1 dose. Draw up prescribed dose (ml) in syringe, remove blue vial access device, then attach syringe to nasal atomizer for intranasal administration.  Use if needed for prolonged seizure greater than 5 minutes. 02/27/20   Kathrynn Ducking, MD  Midazolam 5 MG/0.1ML SOLN Place 0.1 mLs into the nose once as needed for up to 1 dose (prolonged seizure). 02/27/20   Kathrynn Ducking, MD  omeprazole (PRILOSEC) 20 MG capsule Take 20 mg by mouth daily. 06/12/19   [provider]  Prenatal Vit-Fe Fumarate-FA (PRENATAL MULTIVITAMIN) TABS tablet Take 1 tablet by mouth daily at 12 noon.     [provider]  promethazine (PHENERGAN) 25 MG tablet Take 25 mg by mouth every 8 (eight) hours as needed for nausea or vomiting. 04/27/19   [provider]  venlafaxine XR (EFFEXOR-XR) 150 MG 24 hr capsule Take 1 capsule (150 mg total) by mouth daily with breakfast. 07/24/20   Arfeen, Arlyce Harman, MD  warfarin (COUMADIN) 5 MG tablet Take 1.5-2 tablets (7.5-10 mg total) by mouth See admin instructions. Pt takes one and a half tablets (7.5mg  total) on Sunday and Wednesday and 2 tablets (10mg  total) all other days. Patient taking differently: Take 7.5-10 mg by mouth See admin instructions. As per the instructions from coumadin clinic 01/03/18   Thurnell Lose, MD    Allergies    Vicodin [hydrocodone-acetaminophen] and Morphine and related  Review of Systems   Review of Systems  Constitutional:  Negative for fever.  HENT:  Negative for rhinorrhea and sore throat.   Eyes:  Negative for redness.  Respiratory:  Negative for cough.   Cardiovascular:  Negative for chest pain.  Gastrointestinal:  Negative for abdominal pain, diarrhea, nausea and vomiting.  Genitourinary:  Negative for dysuria, frequency, hematuria and urgency.  Musculoskeletal:  Negative for myalgias.  Skin:  Negative for rash.  Neurological:  Positive for tremors, seizures and speech difficulty. Negative for facial asymmetry and headaches.   Physical Exam Updated Vital Signs BP 123/71   Pulse 79   Temp 97.9 F (36.6 C) (Oral)   Resp 16   SpO2 99%   Physical Exam Vitals and nursing note reviewed.  Constitutional:      General: She is not in acute distress.    Appearance: She is well-developed.  HENT:     Head: Normocephalic and atraumatic.     Right Ear: Tympanic membrane, ear canal and external ear normal.     Left Ear: Tympanic membrane, ear canal and external ear normal.     Nose: Nose normal.     Mouth/Throat:     Pharynx: Uvula midline.  Eyes:     General: Lids are normal.     Extraocular  Movements:     Right eye: No nystagmus.     Left eye: No nystagmus.     Conjunctiva/sclera: Conjunctivae normal.     Pupils: Pupils are equal, round, and reactive to light.  Cardiovascular:     Rate and Rhythm: Normal rate and regular rhythm.     Heart sounds: No murmur heard. Pulmonary:     Effort: Pulmonary effort is normal. No respiratory distress.     Breath sounds: Normal breath sounds. No wheezing, rhonchi or rales.  Abdominal:     Palpations: Abdomen is soft.     Tenderness: There is no abdominal tenderness. There is no guarding or rebound.  Musculoskeletal:     Cervical back: Normal range of motion and neck supple. No tenderness or bony tenderness.     Right lower leg: No edema.     Left lower leg: No edema.     Comments: R BKA  Skin:    General: Skin is warm and dry.     Findings: No rash.  Neurological:     General: No focal deficit present.     Mental Status: She is alert and oriented to person, place, and time. Mental status is at baseline.     GCS: GCS eye subscore is 4. GCS verbal subscore is 5. GCS motor subscore is 6.     Cranial Nerves: No cranial nerve deficit.     Sensory: No sensory deficit.     Motor: No weakness.     Coordination: Coordination normal.     Gait: Gait normal.  Psychiatric:        Mood and Affect: Mood normal.    ED Results / Procedures / Treatments   Labs (all labs ordered are listed, but only abnormal results are displayed) Labs Reviewed  CBC WITH DIFFERENTIAL/PLATELET - Abnormal; Notable for the following components:      Result Value   WBC 3.9 (*)    Neutro Abs 1.6 (*)    All other components within normal limits  URINALYSIS, ROUTINE W REFLEX MICROSCOPIC - Abnormal; Notable for the following components:   Color, Urine STRAW (*)    Hgb urine dipstick LARGE (*)  Leukocytes,Ua SMALL (*)    All other components within normal limits  COMPREHENSIVE METABOLIC PANEL    EKG None  Radiology CT Head Wo Contrast  Result Date:  08/19/2020 CLINICAL DATA:  History of seizures.  Slurred speech. EXAM: CT HEAD WITHOUT CONTRAST TECHNIQUE: Contiguous axial images were obtained from the base of the skull through the vertex without intravenous contrast. COMPARISON:  03/03/2018 FINDINGS: Brain: There is no evidence of an acute infarct, intracranial hemorrhage, mass, midline shift, or extra-axial fluid collection. Encephalomalacia is again seen involving the right temporal lobe, insula, frontal lobe, and parietal lobe with ex vacuo dilatation of the right lateral ventricle. Hypodensities elsewhere in the cerebral white matter bilaterally are unchanged and nonspecific but compatible with mild chronic small vessel ischemic disease. Vascular: No hyperdense vessel. Skull: Right pterional craniotomy. Sinuses/Orbits: Visualized paranasal sinuses and mastoid air cells are clear. Unremarkable orbits. Other: None. IMPRESSION: 1. No evidence of acute intracranial abnormality. 2. Chronic findings including moderately extensive encephalomalacia in the right cerebral hemisphere. Electronically Signed   By: Logan Bores M.D.   On: 08/19/2020 11:17    Procedures Procedures   Medications Ordered in ED Medications - No data to display  ED Course  I have reviewed the triage vital signs and the nursing notes.  Pertinent labs & imaging results that were available during my care of the patient were reviewed by me and considered in my medical decision making (see chart for details).  Patient seen and examined. Work-up initiated.  Patient currently at her baseline.  Vital signs reviewed and are as follows: BP 126/78 (BP Location: Left Arm)   Pulse 77   Temp 97.9 F (36.6 C) (Oral)   Resp 16   SpO2 99%   I returned to the room after arrival of mother obtain additional history.  Work-up completed.  Discussed with Dr. Billy Fischer who agrees to see patient.  I discussed case with Dr. Rory Percy of neurology.  We reviewed patient's previous notes, current  presentation and evaluation.  He is aware the patient is back to baseline.    Current plan is to initiate treatment with Onfi 5 mg once a day for additional breakthrough seizure protection.  Patient is encouraged to follow-up with Dr. [name] in 1 to 2 weeks for recheck and to have her vagal nerve stimulator interrogated.  Otherwise, plan to closely monitor patient.  Encouraged return to the emergency department with recurrent symptoms. Patient counseled to return if they have weakness in their arms or legs, slurred speech, trouble walking or talking, confusion, trouble with their balance, or if they have any other concerns. Patient verbalizes understanding and agrees with plan.   Mother and patient verbalized understanding.  Patient is anxious to go home and eat something.  She currently complains of some nausea and back pain that occurs "when I sleep wrong".  Tylenol and Zofran ordered.  Questions answered.  Prescription sent to pharmacy.    MDM Rules/Calculators/A&P                          Patient with medical problems as above presents with episodic dysarthria in setting of poorly controlled epilepsy despite multiple antiepileptics and vagal nerve stimulator.  No concerning findings on CT, labs today.  Patient is back to her baseline.  Discussed with neurology.  At this point, feel that episodes are likely related to her seizure disorder.  Less likely TIA or stroke.  Treatment plan as above.  Do not feel  that patient needs to be admitted at the current time unless symptoms do not improve.  Strict return instructions in case symptoms are recurrent.  Neurology follow-up as above.  Final Clinical Impression(s) / ED Diagnoses Final diagnoses:  Dysarthria  Seizure disorder [facility])    Rx / DC Orders ED Discharge Orders          Ordered    cloBAZam (ONFI) 10 MG tablet  Daily        08/19/20 1336             Carlisle Cater, PA-C 08/19/20 1342    Gareth Morgan, MD 08/21/20 [phone]

## 2020-08-19 NOTE — ED Triage Notes (Signed)
EMS reports from home, mother stated Pt had 2 possible seizures this morning, mother states Pt was conscious throughout and communicating. No post ictal period noted. Pt compliant with Lamictal. Hx of seizures  BP 160/102 HR 94 RR 18 Sp02 99 RA CBG 90 Temp 98.2

## 2020-08-22 ENCOUNTER — Other Ambulatory Visit (HOSPITAL_COMMUNITY): Payer: Self-pay | Admitting: Psychiatry

## 2020-08-22 ENCOUNTER — Other Ambulatory Visit: Payer: Self-pay | Admitting: Neurology

## 2020-08-22 DIAGNOSIS — F419 Anxiety disorder, unspecified: Secondary | ICD-10-CM

## 2020-08-22 DIAGNOSIS — F321 Major depressive disorder, single episode, moderate: Secondary | ICD-10-CM

## 2020-08-26 ENCOUNTER — Telehealth: Payer: Self-pay | Admitting: Neurology

## 2020-08-26 MED ORDER — CLOBAZAM 10 MG PO TABS: 5.0000 mg | ORAL_TABLET | Freq: Every day | ORAL | 3 refills | Status: DC

## 2020-08-26 NOTE — Telephone Encounter (Signed)
Pt's mother, Ronita Hipps (on [place]) called, she went to the ED on 08/19/20. Physician prescribed cloBAZam (ONFI) 10 MG tablet. Can Dr. [name] approve the medication so insurance will pay for it? Also will Clobazam have interaction with her seizure medication you have prescribed. Would like a call from the nurse.

## 2020-08-26 NOTE — Addendum Note (Signed)
Addended by: Kathrynn Ducking on: 08/26/2020 06:05 PM   Modules accepted: Orders

## 2020-08-26 NOTE — Telephone Encounter (Signed)
I called the mother.  The patient went to the hospital recently, was placed on 5 mg of Onfi in the evening.  This will require prior authorization, I will send in the prescription again to get authorization.  They have not been able to afford the Versed nasal spray for a rescue drug for seizures.

## 2020-09-11 ENCOUNTER — Ambulatory Visit (HOSPITAL_COMMUNITY): Payer: Medicare Other | Admitting: Licensed Clinical Social Worker

## 2020-09-12 ENCOUNTER — Other Ambulatory Visit (HOSPITAL_COMMUNITY): Payer: Self-pay | Admitting: Psychiatry

## 2020-09-12 ENCOUNTER — Other Ambulatory Visit: Payer: Self-pay | Admitting: Neurology

## 2020-09-12 DIAGNOSIS — F321 Major depressive disorder, single episode, moderate: Secondary | ICD-10-CM

## 2020-09-12 DIAGNOSIS — F419 Anxiety disorder, unspecified: Secondary | ICD-10-CM

## 2020-09-16 ENCOUNTER — Other Ambulatory Visit: Payer: Self-pay | Admitting: Neurology

## 2020-09-17 ENCOUNTER — Telehealth: Payer: Self-pay

## 2020-09-17 NOTE — Telephone Encounter (Signed)
PA submitted to [employer] Key: [id number] - PA Case ID: [id number] - Rx #: [id number] Outcome Approved today Request Reference Number: [id number]. CLOBAZAM TAB '10MG'$  is approved through 02/21/2021. Your patient may now fill this prescription and it will be covered.

## 2020-09-22 ENCOUNTER — Ambulatory Visit: Payer: Medicare Other | Admitting: Neurology

## 2020-09-26 ENCOUNTER — Telehealth: Payer: Self-pay | Admitting: Neurology

## 2020-09-26 ENCOUNTER — Other Ambulatory Visit: Payer: Self-pay | Admitting: Neurology

## 2020-09-26 NOTE — Telephone Encounter (Signed)
Pt's mother called requesting refill for daughters levETIRAcetam (KEPPRA) 1000 MG tablet. [id number]

## 2020-09-29 NOTE — Telephone Encounter (Signed)
Refill was sent on 07/23/2020. Pharmacy notified to dispense med on this date as well.

## 2020-09-30 ENCOUNTER — Encounter (HOSPITAL_COMMUNITY): Payer: Self-pay | Admitting: Licensed Clinical Social Worker

## 2020-09-30 ENCOUNTER — Ambulatory Visit (INDEPENDENT_AMBULATORY_CARE_PROVIDER_SITE_OTHER): Payer: Medicare Other | Admitting: Licensed Clinical Social Worker

## 2020-09-30 ENCOUNTER — Other Ambulatory Visit: Payer: Self-pay

## 2020-09-30 DIAGNOSIS — F330 Major depressive disorder, recurrent, mild: Secondary | ICD-10-CM

## 2020-09-30 NOTE — Progress Notes (Signed)
Virtual Visit via Telephone Note  I connected with Cassandra Andrade on 09/30/20 at 12:30 PM EDT by telephone and verified that I am speaking with the correct person using two identifiers.  Location: Patient: home Provider: office   I discussed the limitations, risks, security and privacy concerns of performing an evaluation and management service by telephone and the availability of in person appointments. I also discussed with the patient that there may be a patient responsible charge related to this service. The patient expressed understanding and agreed to proceed.    Type of Therapy: Individual Therapy   Treatment Goals addressed:"I want to maintain my happy mood, positive outlook on life, and to stay motivated to walk". Cassandra Andrade will report ongoing positive mood and positive outlook on life 6 out of 7 days per self report.   Interventions: Motivational Interviewing   Summary: Cassandra Andrade is a 55 y.o. female who presents with Major Depressive Disorder, recurrent, mild   Suicidal/Homicidal: No -without intent/plan   Therapist Response: Cassandra Andrade met with Cassandra Andrade for an individual session. Cassandra Andrade discussed her psychiatric symptoms, her current life events and her homework. Cassandra Andrade shared that she, her mother, and her niece bought a house together and they moved in last week. Cassandra Andrade utilized MI OARS to reflect and summarize thoughts and feelings about the new house, noting that it is smaller than their rental, but it is theirs and the stability eases a lot of stress. Cassandra Andrade processed Cassandra Andrade's frustration at herself for not being able to stand or walk on her own without a walker. Cassandra Andrade explored how realistic that goal is and explored any smaller goals that can be accomplished on the way to that big goal. Cassandra Andrade noted improvement in attitude about the prosthetic leg. Cassandra Andrade also noted that her self-worth does not need to be contingent on her ability to walk by herself. Cassandra Andrade  encouraged Cassandra Andrade to give herself a peptalk, like she would anyone else. Cassandra Andrade noted that Cassandra Andrade knows all the positive words, she just needs to say them to herself and hear herself.    Plan: Return again in 4 weeks.   Diagnosis: Axis I: Major Depressive Disorder, recurrent, mild        I discussed the assessment and treatment plan with the patient. The patient was provided an opportunity to ask questions and all were answered. The patient agreed with the plan and demonstrated an understanding of the instructions.   The patient was advised to call back or seek an in-person evaluation if the symptoms worsen or if the condition fails to improve as anticipated.  I provided 45 minutes of non-face-to-face time during this encounter.   Cassandra Curling, LCSW

## 2020-10-04 ENCOUNTER — Other Ambulatory Visit: Payer: Self-pay | Admitting: Neurology

## 2020-10-04 ENCOUNTER — Other Ambulatory Visit (HOSPITAL_COMMUNITY): Payer: Self-pay | Admitting: Psychiatry

## 2020-10-04 DIAGNOSIS — F321 Major depressive disorder, single episode, moderate: Secondary | ICD-10-CM

## 2020-10-04 DIAGNOSIS — F419 Anxiety disorder, unspecified: Secondary | ICD-10-CM

## 2020-10-15 ENCOUNTER — Ambulatory Visit: Payer: Medicare Other | Admitting: Neurology

## 2020-10-15 ENCOUNTER — Telehealth: Payer: Self-pay | Admitting: Neurology

## 2020-10-15 NOTE — Telephone Encounter (Signed)
This is the second revisit no-show for this patient, she did not show on 02 January 2020.

## 2020-10-23 ENCOUNTER — Telehealth (HOSPITAL_COMMUNITY): Payer: Medicare Other | Admitting: Psychiatry

## 2020-10-28 ENCOUNTER — Other Ambulatory Visit: Payer: Self-pay | Admitting: Neurology

## 2020-10-28 ENCOUNTER — Ambulatory Visit (INDEPENDENT_AMBULATORY_CARE_PROVIDER_SITE_OTHER): Payer: Medicare Other | Admitting: Licensed Clinical Social Worker

## 2020-10-28 ENCOUNTER — Other Ambulatory Visit: Payer: Self-pay

## 2020-10-28 ENCOUNTER — Other Ambulatory Visit (HOSPITAL_COMMUNITY): Payer: Self-pay | Admitting: Psychiatry

## 2020-10-28 DIAGNOSIS — F330 Major depressive disorder, recurrent, mild: Secondary | ICD-10-CM | POA: Diagnosis not present

## 2020-10-28 DIAGNOSIS — F321 Major depressive disorder, single episode, moderate: Secondary | ICD-10-CM

## 2020-10-28 DIAGNOSIS — F419 Anxiety disorder, unspecified: Secondary | ICD-10-CM

## 2020-10-29 ENCOUNTER — Encounter (HOSPITAL_COMMUNITY): Payer: Self-pay | Admitting: Licensed Clinical Social Worker

## 2020-10-29 NOTE — Progress Notes (Signed)
Virtual Visit via Telephone Note  I connected with Cassandra Andrade on 10/29/20 at  1:30 PM EDT by telephone and verified that I am speaking with the correct person using two identifiers.  Location: Patient: home Provider: office   I discussed the limitations, risks, security and privacy concerns of performing an evaluation and management service by telephone and the availability of in person appointments. I also discussed with the patient that there may be a patient responsible charge related to this service. The patient expressed understanding and agreed to proceed.  Type of Therapy: Individual Therapy   Treatment Goals addressed:"I want to maintain my happy mood, positive outlook on life, and to stay motivated to walk". Cassandra Andrade will report ongoing positive mood and positive outlook on life 6 out of 7 days per self report.   Interventions: Motivational Interviewing   Summary: Cassandra Andrade is a 55 y.o. female who presents with Major Depressive Disorder, recurrent, mild   Suicidal/Homicidal: No -without intent/plan   Therapist Response: Cassandra Andrade met with clinician for an individual session. Cassandra Andrade discussed her psychiatric symptoms, her current life events and her homework. Cassandra Andrade shared that she has been doing pretty well with her mood. Although, she shared some discouragement with her walking. Clinician utilized MI OARS to reflect and summarize thoughts and feelings about her progress, or lack of progress according to [facility]. Clinician affirmed frustration that other family members who have had strokes can walk. 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 Clinician normalized the urge to get up and run. However, clinician encouraged Cassandra Andrade to challenge herself based on her own progress, not the progress of others. Clinician also  identified that there may be a cap on how far she can progress due to the injury to her brain and to her body. Clinician discussed ways to remain positive and grateful, regardless of her ability to walk on her own.    Plan: Return again in 4 weeks.   Diagnosis: Axis I: Major Depressive Disorder, recurrent, mild      I discussed the assessment and treatment plan with the patient. The patient was provided an opportunity to ask questions and all were answered. The patient agreed with the plan and demonstrated an understanding of the instructions.   The patient was advised to call back or seek an in-person evaluation if the symptoms worsen or if the condition fails to improve as anticipated.  I provided 45 minutes of non-face-to-face time during this encounter.   [name], LCSW

## 2020-11-03 ENCOUNTER — Telehealth (INDEPENDENT_AMBULATORY_CARE_PROVIDER_SITE_OTHER): Payer: Medicare Other | Admitting: Psychiatry

## 2020-11-03 ENCOUNTER — Encounter (HOSPITAL_COMMUNITY): Payer: Self-pay | Admitting: Psychiatry

## 2020-11-03 ENCOUNTER — Other Ambulatory Visit: Payer: Self-pay

## 2020-11-03 DIAGNOSIS — F419 Anxiety disorder, unspecified: Secondary | ICD-10-CM

## 2020-11-03 DIAGNOSIS — F321 Major depressive disorder, single episode, moderate: Secondary | ICD-10-CM | POA: Diagnosis not present

## 2020-11-03 MED ORDER — VENLAFAXINE HCL ER 150 MG PO CP24: 150.0000 mg | ORAL_CAPSULE | Freq: Every day | ORAL | 0 refills | Status: DC

## 2020-11-03 NOTE — Progress Notes (Signed)
Virtual Visit via Telephone Note  I connected with Cassandra Andrade on 11/03/20 at  2:40 PM EDT by telephone and verified that I am speaking with the correct person using two identifiers.  Location: Patient: Home Provider: Home Office   I discussed the limitations, risks, security and privacy concerns of performing an evaluation and management service by telephone and the availability of in person appointments. I also discussed with the patient that there may be a patient responsible charge related to this service. The patient expressed understanding and agreed to proceed.   History of Present Illness: Patient is evaluated by phone session.  Patient and her mother moved to a new place and she liked it because it is bigger.  However disappointed able to get the.  She is now more close to her kids and one of the kids she talks to him every day.  Sometimes she does get elated mood and euphoric but denies any mania, psychosis.  She is in therapy with Janett Billow and that is helping her to deal with stress and giving coping skills.  She is sleeping good most of the time.  She denies any crying spells, irritability, feeling of hopelessness or worthlessness.  She reported her appetite is good and did not notice any change in her weight.  She had a seizure back in June and seen in the emergency room.  She had a blood work.  Patient like to keep the venlafaxine at present dose.  She is also taking antiseizure medicine from neurology.   Past Psychiatric History:  H/O inpatient in 2011 due to overdose on pain medication. No h/o mania or psychosis.  Tried Zoloft and Remeron but do not remember.  Took Lexapro and Abilify but stopped due to lack of response.  Prozac worked for a long time until it stopped working.  Recent Results (from the past 2160 hour(s))  Urinalysis, Routine w reflex microscopic Urine, Clean Catch     Status: Abnormal   Collection Time: 08/19/20 10:24 AM  Result Value Ref Range   Color, Urine  STRAW (A) YELLOW   APPearance CLEAR CLEAR   Specific Gravity, Urine 1.015 1.005 - 1.030   pH 6.0 5.0 - 8.0   Glucose, UA NEGATIVE NEGATIVE mg/dL   Hgb urine dipstick LARGE (A) NEGATIVE   Bilirubin Urine NEGATIVE NEGATIVE   Ketones, ur NEGATIVE NEGATIVE mg/dL   Protein, ur NEGATIVE NEGATIVE mg/dL   Nitrite NEGATIVE NEGATIVE   Leukocytes,Ua SMALL (A) NEGATIVE   RBC / HPF 21-50 0 - 5 RBC/hpf   WBC, UA 0-5 0 - 5 WBC/hpf   Bacteria, UA NONE SEEN NONE SEEN   Squamous Epithelial / LPF 0-5 0 - 5   Mucus PRESENT    Hyaline Casts, UA PRESENT     Comment: Performed at [facility], [place] [address], [place], [place] [address]  CBC with Differential/Platelet     Status: Abnormal   Collection Time: 08/19/20 10:28 AM  Result Value Ref Range   WBC 3.9 (L) 4.0 - 10.5 K/uL   RBC 4.22 3.87 - 5.11 MIL/uL   Hemoglobin 12.2 12.0 - 15.0 g/dL   HCT 38.4 36.0 - 46.0 %   MCV 91.0 80.0 - 100.0 fL   MCH 28.9 26.0 - 34.0 pg   MCHC 31.8 30.0 - 36.0 g/dL   RDW 13.5 11.5 - 15.5 %   Platelets 223 150 - 400 K/uL   nRBC 0.0 0.0 - 0.2 %   Neutrophils Relative % 40 %  Neutro Abs 1.6 (L) 1.7 - 7.7 K/uL   Lymphocytes Relative 49 %   Lymphs Abs 1.9 0.7 - 4.0 K/uL   Monocytes Relative 7 %   Monocytes Absolute 0.3 0.1 - 1.0 K/uL   Eosinophils Relative 3 %   Eosinophils Absolute 0.1 0.0 - 0.5 K/uL   Basophils Relative 1 %   Basophils Absolute 0.0 0.0 - 0.1 K/uL   Immature Granulocytes 0 %   Abs Immature Granulocytes 0.01 0.00 - 0.07 K/uL    Comment: Performed at [facility], [place] [address], [place], [place] [address]  Comprehensive metabolic panel     Status: None   Collection Time: 08/19/20 10:28 AM  Result Value Ref Range   Sodium 139 135 - 145 mmol/L   Potassium 4.4 3.5 - 5.1 mmol/L   Chloride 103 98 - 111 mmol/L   CO2 29 22 - 32 mmol/L   Glucose, Bld 84 70 - 99 mg/dL    Comment: Glucose reference range applies only to samples taken after fasting for at least 8  hours.   BUN 20 6 - 20 mg/dL   Creatinine, Ser 0.94 0.44 - 1.00 mg/dL   Calcium 9.5 8.9 - 10.3 mg/dL   Total Protein 7.7 6.5 - 8.1 g/dL   Albumin 4.4 3.5 - 5.0 g/dL   AST 34 15 - 41 U/L   ALT 44 0 - 44 U/L   Alkaline Phosphatase 86 38 - 126 U/L   Total Bilirubin 0.6 0.3 - 1.2 mg/dL   GFR, Estimated >60 >60 mL/min    Comment: (NOTE) Calculated using the CKD-EPI Creatinine Equation (2021)    Anion gap 7 5 - 15    Comment: Performed at [facility], [place] [address], [place],  [address]      Psychiatric Specialty Exam: Physical Exam  Review of Systems  There were no vitals taken for this visit.There is no height or weight on file to calculate BMI.  General Appearance: NA  Eye Contact:  NA  Speech:  Clear and Coherent and fast  Volume:  Normal  Mood:  Anxious  Affect:  NA  Thought Process:  Descriptions of Associations: Intact  Orientation:  Full (Time, Place, and Person)  Thought Content:  Rumination  Suicidal Thoughts:  No  Homicidal Thoughts:  No  Memory:  Immediate;   Fair Recent;   Fair Remote;   Fair  Judgement:  Intact  Insight:  Present  Psychomotor Activity:  NA  Concentration:  Concentration: Fair and Attention Span: Fair  Recall:  [employer] of Knowledge:  Fair  Language:  Good  Akathisia:  No  Handed:  Right  AIMS (if indicated):     Assets:  Communication Skills Desire for Improvement Housing Social Support  ADL's:  Intact  Cognition:  WNL  Sleep:   fair      Assessment and Plan: Major depressive disorder, recurrent.  Anxiety.  Reviewed blood work results.  Patient is stable on venlafaxine.  Continue venlafaxine 150 mg daily.  Recommended to call us back if she has any question or any concern.  Follow-up in 3 months.  Follow Up Instructions:    I discussed the assessment and treatment plan with the patient. The patient was provided an opportunity to ask questions and all were answered. The patient agreed with the plan  and demonstrated an understanding of the instructions.   The patient was advised to call back or seek an in-person evaluation if the symptoms worsen  or if the condition fails to improve as anticipated.  I provided 12 minutes of non-face-to-face time during this encounter.   Kathlee Nations, MD

## 2020-11-12 ENCOUNTER — Encounter: Payer: Self-pay | Admitting: Adult Health

## 2020-11-12 ENCOUNTER — Ambulatory Visit (INDEPENDENT_AMBULATORY_CARE_PROVIDER_SITE_OTHER): Payer: Medicare Other | Admitting: Adult Health

## 2020-11-12 VITALS — BP 108/67 | HR 86 | Ht 67.0 in | Wt 175.0 lb

## 2020-11-12 DIAGNOSIS — Z5181 Encounter for therapeutic drug level monitoring: Secondary | ICD-10-CM | POA: Diagnosis not present

## 2020-11-12 DIAGNOSIS — G40919 Epilepsy, unspecified, intractable, without status epilepticus: Secondary | ICD-10-CM | POA: Diagnosis not present

## 2020-11-12 MED ORDER — LAMOTRIGINE 100 MG PO TABS: 100.0000 mg | ORAL_TABLET | Freq: Two times a day (BID) | ORAL | 1 refills | Status: DC

## 2020-11-12 NOTE — Patient Instructions (Signed)
Your Plan: ? ?Continue ? ? ? ? ?Thank you for coming to see us at [facility]. I hope we have been able to provide you high quality care today. ? ?You may receive a patient satisfaction survey over the next few weeks. We would appreciate your feedback and comments so that we may continue to improve ourselves and the health of our patients. ? ?

## 2020-11-12 NOTE — Progress Notes (Signed)
PATIENT: Cassandra Andrade DOB: 04/28/1965  REASON FOR VISIT: follow up HISTORY FROM: patient PRIMARY NEUROLOGIST: Dr. 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  The patient continues to have 1-2 seizure events a week.  She does have a vagal nerve stimulator and tries to use her magnet if she feels an event about to occur.  She states that she typically feels like she is "losing control" before seizure starts.  She typically will try to use her magnet and that will decrease the severity of a seizure.  She remains on Lamictal 300 mg twice a day, Vimpat 200 mg twice a day Keppra 1500 mg in the morning and 2000 mg in evening.  She was prescribed Onfi 5 mg daily however she has not started this.  Patient's mother states that they were concerned about potential side effects.    HISTORY (copied from Dr. 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  The patient has a right above-knee amputation, she has a prosthesis for this but she believes that her ability to walk has declined somewhat over the last several months, she has not had any further falls.  She tries to do exercises.  The patient is still having relatively frequent seizures, but most of her seizures she can abort with the vagal nerve stimulator.  She returns for further evaluation.  She remains on Lamictal, Vimpat, and Keppra.  She tolerates medications well.  REVIEW OF SYSTEMS: Out of a complete 14 system review of symptoms, the patient complains only of the following symptoms, and all other reviewed systems are negative.  ALLERGIES: Allergies  Allergen Reactions   Vicodin [Hydrocodone-Acetaminophen] Nausea And Vomiting   Morphine And Related Rash    Allergic reaction only in iv form    HOME MEDICATIONS: Outpatient  Medications Prior to Visit  Medication Sig Dispense Refill   acetaminophen (TYLENOL) 650 MG CR tablet Take 1,300 mg by mouth every 8 (eight) hours as needed for pain.     Ascorbic Acid (VITAMIN C) 1000 MG tablet Take 1,000 mg by mouth daily.     cloBAZam (ONFI) 10 MG tablet Take 0.5 tablets (5 mg total) by mouth daily. 15 tablet 3   lacosamide (VIMPAT) 200 MG TABS tablet Take 1 tablet (200 mg total) by mouth 2 (two) times daily. 180 tablet 2   lamoTRIgine (LAMICTAL) 200 MG tablet TAKE 1 TABLET BY MOUTH TWICE A DAY 180 tablet 3   levETIRAcetam (KEPPRA) 1000 MG tablet Take 1.5 tablets in the morning and 2 tablets in the evening 315 tablet 3   levothyroxine (SYNTHROID, LEVOTHROID) 50 MCG tablet Take 50 mcg by mouth daily before breakfast.     midazolam (VERSED) 5 MG/ML injection Place 1 mL (5 mg total) into the nose once as needed for up to 1 dose. Draw up prescribed dose (ml) in syringe, remove blue vial access device, then attach syringe to nasal atomizer for intranasal administration.  Use if needed for prolonged seizure greater than 5 minutes. 2 mL 3   omeprazole (PRILOSEC) 20 MG capsule Take 20 mg by mouth daily.     Prenatal Vit-Fe Fumarate-FA (PRENATAL MULTIVITAMIN) TABS tablet Take 1 tablet by mouth daily at 12 noon.     promethazine (PHENERGAN) 25 MG tablet Take 25 mg by mouth every  8 (eight) hours as needed for nausea or vomiting.     venlafaxine XR (EFFEXOR-XR) 150 MG 24 hr capsule Take 1 capsule (150 mg total) by mouth daily with breakfast. 90 capsule 0   warfarin (COUMADIN) 5 MG tablet Take 1.5-2 tablets (7.5-10 mg total) by mouth See admin instructions. Pt takes one and a half tablets (7.5mg  total) on Sunday and Wednesday and 2 tablets (10mg  total) all other days. (Patient taking differently: Take 7.5-10 mg by mouth See admin instructions. As per the instructions from coumadin clinic)     lamoTRIgine (LAMICTAL) 100 MG tablet Take 1 tablet (100 mg total) by mouth 2 (two) times daily. 180  tablet 1   No facility-administered medications prior to visit.    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 femur, folllowed by MRSA   TOTAL KNEE ARTHROPLASTY Right     FAMILY HISTORY: Family History  Problem Relation Age of Onset   Anxiety disorder Mother    Diabetes Father    Seizures Neg Hx     SOCIAL HISTORY: Social History   Socioeconomic History   Marital status: Divorced    Spouse name: Not on file   Number of children: 3   Years of education: GED   Highest education level: Not on file  Occupational History   Not on file  Tobacco Use   Smoking status: Never   Smokeless tobacco: Never   Tobacco comments:    never used tobacco  Vaping Use   Vaping Use: Never used  Substance and Sexual Activity   Alcohol use: No    Alcohol/week: 0.0 standard drinks   Drug use: No   Sexual activity: Not Currently  Other Topics Concern   Not on file  Social History Narrative   Patient lives at home with mom.    Patient has 3 children.    Drinks no caffeine   Right Handed         Social Determinants of Health   Financial Resource Strain: Not on file  Food Insecurity: Not on file  Transportation Needs: Not on file  Physical Activity: Not on file  Stress: Not on file  Social Connections: Not on file  Intimate Partner Violence: Not on  file      PHYSICAL EXAM  Vitals:   11/12/20 1140  BP: 108/67  Pulse: 86  Weight: 175 lb (79.4 kg)  Height: 5\' 7"  (1.702 m)   Body mass index is 27.41 kg/m.  Generalized: Well developed, in no acute distress   Neurological examination  Mentation: Alert oriented to time, place, history taking. Follows all commands speech and language fluent Cranial nerve II-XII: Pupils were equal round reactive to light. Extraocular movements were full, visual field were full on confrontational  test. Facial sensation and strength were normal. Uvula tongue midline. Head turning and shoulder shrug  were normal and symmetric. Motor: The motor testing reveals 5 over 5 strength of all 4 extremities.  Above-the-knee amputation on the right. Sensory: Sensory testing is intact to soft touch on all 4 extremities. No evidence of extinction is noted.  Coordination: Cerebellar testing reveals good finger-nose-finger and heel-to-shin bilaterally.  Gait and station: Patient is in a wheelchair Reflexes: Deep tendon reflexes are symmetric and normal bilaterally.   DIAGNOSTIC DATA (LABS, IMAGING, TESTING) - I reviewed patient records, labs, notes, testing and imaging myself where available.  Lab Results  Component Value Date   WBC 3.9 (L) 08/19/2020   HGB 12.2 08/19/2020   HCT 38.4 08/19/2020   MCV 91.0 08/19/2020   PLT 223 08/19/2020      Component Value Date/Time   NA 139 08/19/2020 1028   NA 144 03/24/2020 1324   NA 141 01/12/2017 1125   K 4.4 08/19/2020 1028   K 4.5 01/12/2017 1125   CL 103 08/19/2020 1028   CL 97 (L) 07/11/2013 1346   CO2 29 08/19/2020 1028   CO2 30 (H) 01/12/2017 1125   GLUCOSE 84 08/19/2020 1028   GLUCOSE 79 01/12/2017 1125   GLUCOSE 126 (H) 07/11/2013 1346   BUN 20 08/19/2020 1028   BUN 17 03/24/2020 1324   BUN 11.2 01/12/2017 1125   CREATININE 0.94 08/19/2020 1028   CREATININE 0.98 01/11/2018 1116   CREATININE 0.9 01/12/2017 1125   CALCIUM 9.5 08/19/2020 1028    CALCIUM 10.1 01/12/2017 1125   PROT 7.7 08/19/2020 1028   PROT 7.4 03/24/2020 1324   PROT 7.8 01/12/2017 1125   ALBUMIN 4.4 08/19/2020 1028   ALBUMIN 4.6 03/24/2020 1324   ALBUMIN 4.0 01/12/2017 1125   AST 34 08/19/2020 1028   AST 23 01/11/2018 1116   AST 34 01/12/2017 1125   ALT 44 08/19/2020 1028   ALT 22 01/11/2018 1116   ALT 65 (H) 01/12/2017 1125   ALKPHOS 86 08/19/2020 1028   ALKPHOS 105 01/12/2017 1125   BILITOT 0.6 08/19/2020 1028   BILITOT 0.2 03/24/2020 1324   BILITOT 0.2 (L) 01/11/2018 1116   BILITOT 0.23 01/12/2017 1125   GFRNONAA >60 08/19/2020 1028   GFRNONAA >60 01/11/2018 1116   GFRAA 69 03/24/2020 1324   GFRAA >60 01/11/2018 1116   No results found for: CHOL, HDL, LDLCALC, LDLDIRECT, TRIG, CHOLHDL Lab Results  Component Value Date   HGBA1C (L) 12/26/2009    4.3 (NOTE)                                                                       According to the ADA Clinical Practice Recommendations for 2011, when HbA1c is used as a screening test:   >=6.5%   Diagnostic of Diabetes Mellitus           (if abnormal result  is confirmed)  5.7-6.4%   Increased risk of developing Diabetes Mellitus  References:Diagnosis and Classification of Diabetes Mellitus,Diabetes JOAC,1660,63(KZSWF 1):S62-S69 and Standards of Medical Care in         Diabetes - 2011,Diabetes Care,2011,34  (Suppl 1):S11-S61.   Lab Results  Component Value Date   VITAMINB12 602 02/11/2010   Lab  Results  Component Value Date   TSH 2.417 02/17/2013      ASSESSMENT AND PLAN 55 y.o. year old female  has a past medical history of Anxiety, Arthritis, Cancer ([place]), CVA (cerebrovascular accident due to intracerebral hemorrhage) ([place]) (2004), Depression, DVT (deep venous thrombosis) ([place]), MRSA (methicillin resistant Staphylococcus aureus), S/P BKA (below knee amputation) unilateral ([place]), Seizures ([place]), Thyroid disease, and Unspecified epilepsy with intractable epilepsy (02/28/2013). here with :  Intractable  seizures  Continue Lamictal 300 mg twice a day Continue Vimpat 200 mg twice a day Continue Keppra 1500 mg in the morning and 2000 mg at night Continue to use VNS magnet We will check blood work today Encouraged the patient to try Onfi 5 mg daily.  They have the prescription but have not started it yet.  Reviewed side effects with the patient and mother. Due to Dr. Tobey Grim retirement the patient will be set up with a follow-up appointment with Dr. Vito Backers, MSN, NP-C 11/12/2020, 1:40 PM [facility] [address] [place], [place] [address] [phone]

## 2020-11-12 NOTE — Progress Notes (Signed)
I have read the note, and I agree with the clinical assessment and plan.  Kurtiss Wence K Sydny Schnitzler   

## 2020-11-13 ENCOUNTER — Telehealth: Payer: Self-pay | Admitting: Adult Health

## 2020-11-13 MED ORDER — NAYZILAM 5 MG/0.1ML NA SOLN: NASAL | 3 refills | Status: DC

## 2020-11-13 MED ORDER — MIDAZOLAM 5 MG/ML ADULT INJ FOR INTRANASAL USE (MC USE ONLY): 5.0000 mg | Freq: Once | INTRAMUSCULAR | 3 refills | Status: DC | PRN

## 2020-11-13 NOTE — Telephone Encounter (Signed)
I called spoke to [place]  [facility] (they have one bottle available.  Pt has had 2 seziures.  Order done. To MM/NP to sign off.  Sunday Spillers informed as well.

## 2020-11-13 NOTE — Telephone Encounter (Signed)
Spoke to mother of pt and relayed that prescription was done and you should be able to pick up.  I had spoken to Sunday Spillers about this and she relayed that one bottle available 1 bottle available to pick up.  Mother verbalized understanding.

## 2020-11-13 NOTE — Telephone Encounter (Signed)
[name] called wanting to know if the [facility] 5mg  Rx can be sent to the [facility] [address]. They are waiting for the Rx to fill for pt. Please advise.

## 2020-11-13 NOTE — Telephone Encounter (Signed)
[facility] called, verify medication. Received a prescription for Versed injection. Need a prescription for Nayzilam, patient has had 2 seizures. Would like a call from the nurse.

## 2020-11-13 NOTE — Telephone Encounter (Signed)
done

## 2020-11-13 NOTE — Telephone Encounter (Signed)
Per [facility] registry, last filled 03/27/2020 Nayzilam 5 Mg Nasal Spray #2/30 prescribed by Dr [name].

## 2020-11-13 NOTE — Telephone Encounter (Signed)
Pt's mother is asking for a call to discuss why the Nayzilam did not get called in for pt on yesterday.

## 2020-11-13 NOTE — Telephone Encounter (Signed)
Rx has printed, pending MM NP signature then need to fax to pharmacy. Pt's mother aware prescribed today. She verbalized appreciation.

## 2020-11-13 NOTE — Telephone Encounter (Signed)
Rx signed and faxed to [facility] [phone]. Received a receipt of confirmation.

## 2020-11-14 ENCOUNTER — Other Ambulatory Visit: Payer: Self-pay | Admitting: Neurology

## 2020-11-14 LAB — CBC WITH DIFFERENTIAL/PLATELET
Basophils Absolute: 0 10*3/uL (ref 0.0–0.2)
Basos: 1 %
EOS (ABSOLUTE): 0.1 10*3/uL (ref 0.0–0.4)
Eos: 2 %
Hematocrit: 39.1 % (ref 34.0–46.6)
Hemoglobin: 12.3 g/dL (ref 11.1–15.9)
Immature Grans (Abs): 0 10*3/uL (ref 0.0–0.1)
Immature Granulocytes: 0 %
Lymphocytes Absolute: 2 10*3/uL (ref 0.7–3.1)
Lymphs: 48 %
MCH: 28.6 pg (ref 26.6–33.0)
MCHC: 31.5 g/dL (ref 31.5–35.7)
MCV: 91 fL (ref 79–97)
Monocytes Absolute: 0.4 10*3/uL (ref 0.1–0.9)
Monocytes: 9 %
Neutrophils Absolute: 1.6 10*3/uL (ref 1.4–7.0)
Neutrophils: 40 %
Platelets: 230 10*3/uL (ref 150–450)
RBC: 4.3 x10E6/uL (ref 3.77–5.28)
RDW: 12.7 % (ref 11.7–15.4)
WBC: 4.1 10*3/uL (ref 3.4–10.8)

## 2020-11-14 LAB — COMPREHENSIVE METABOLIC PANEL
ALT: 48 IU/L — ABNORMAL HIGH (ref 0–32)
AST: 28 IU/L (ref 0–40)
Albumin/Globulin Ratio: 1.8 (ref 1.2–2.2)
Albumin: 4.5 g/dL (ref 3.8–4.9)
Alkaline Phosphatase: 122 IU/L — ABNORMAL HIGH (ref 44–121)
BUN/Creatinine Ratio: 15 (ref 9–23)
BUN: 20 mg/dL (ref 6–24)
Bilirubin Total: <0.2 mg/dL (ref 0.0–1.2)
CO2: 24 mmol/L (ref 20–29)
Calcium: 9.2 mg/dL (ref 8.7–10.2)
Chloride: 104 mmol/L (ref 96–106)
Creatinine, Ser: 1.35 mg/dL — ABNORMAL HIGH (ref 0.57–1.00)
Globulin, Total: 2.5 g/dL (ref 1.5–4.5)
Glucose: 89 mg/dL (ref 65–99)
Potassium: 4.6 mmol/L (ref 3.5–5.2)
Sodium: 143 mmol/L (ref 134–144)
Total Protein: 7 g/dL (ref 6.0–8.5)
eGFR: 47 mL/min/{1.73_m2} — ABNORMAL LOW (ref >59)

## 2020-11-14 LAB — LAMOTRIGINE LEVEL: Lamotrigine Lvl: 11.2 ug/mL (ref 2.0–20.0)

## 2020-11-17 ENCOUNTER — Telehealth: Payer: Self-pay | Admitting: *Deleted

## 2020-11-17 NOTE — Telephone Encounter (Signed)
I called pt, spoke to mother of pt.  I relayed the lab results per MM/NP Creatinine ALT and alk phos slightly elevated.  Liver enzymes to monitor over time, the creatinine will relay to pcp.  Mother verbalized understanding. She has appt Thursday already and will address with pcp if they need to do anything else.  Will fax to Jones, Penny NP at [facility].   

## 2020-11-17 NOTE — Telephone Encounter (Signed)
-----   Message from Ward Givens, NP sent at 11/17/2020 11:59 AM EDT ----- Creatinine is slightly elevated. Please send to PCP.  Alkaline phosphatase and ALT is slightly elevated.  We will just monitor for now

## 2020-11-18 ENCOUNTER — Other Ambulatory Visit: Payer: Self-pay | Admitting: Neurology

## 2020-11-18 NOTE — Telephone Encounter (Signed)
Received a refill request for midazolam from [place]. Patient picked up [facility]  I called patient, spoke with patient's mother. They would [facility].

## 2020-11-18 NOTE — Telephone Encounter (Signed)
[facility]. Patient picked up a 10 day supply of midazolam on 11/13/2020. It is too soon for a refill to another pharmacy.

## 2020-11-21 ENCOUNTER — Other Ambulatory Visit: Payer: Self-pay | Admitting: Nurse Practitioner

## 2020-11-21 DIAGNOSIS — R34 Anuria and oliguria: Secondary | ICD-10-CM

## 2020-11-21 DIAGNOSIS — R310 Gross hematuria: Secondary | ICD-10-CM

## 2020-11-21 DIAGNOSIS — N022 Recurrent and persistent hematuria with diffuse membranous glomerulonephritis: Secondary | ICD-10-CM

## 2020-11-24 ENCOUNTER — Other Ambulatory Visit: Payer: Self-pay | Admitting: Nurse Practitioner

## 2020-11-24 ENCOUNTER — Ambulatory Visit
Admission: RE | Admit: 2020-11-24 | Discharge: 2020-11-24 | Disposition: A | Discharge status: Home / Self Care | Payer: TRICARE For Life (TFL) | Source: Ambulatory Visit | Attending: Nurse Practitioner | Admitting: Nurse Practitioner

## 2020-11-24 ENCOUNTER — Ambulatory Visit
Admission: RE | Admit: 2020-11-24 | Discharge: 2020-11-24 | Disposition: A | Discharge status: Home / Self Care | Payer: Medicare Other | Source: Ambulatory Visit | Attending: Nurse Practitioner | Admitting: Nurse Practitioner

## 2020-11-24 DIAGNOSIS — R34 Anuria and oliguria: Secondary | ICD-10-CM

## 2020-11-24 DIAGNOSIS — R310 Gross hematuria: Secondary | ICD-10-CM

## 2020-11-24 DIAGNOSIS — N022 Recurrent and persistent hematuria with diffuse membranous glomerulonephritis: Secondary | ICD-10-CM

## 2020-11-25 ENCOUNTER — Ambulatory Visit (HOSPITAL_COMMUNITY): Payer: Medicare Other | Admitting: Licensed Clinical Social Worker

## 2020-11-25 ENCOUNTER — Other Ambulatory Visit: Payer: Self-pay

## 2020-12-02 ENCOUNTER — Telehealth: Payer: Self-pay | Admitting: Adult Health

## 2020-12-02 NOTE — Telephone Encounter (Signed)
I called pharmacy and spoke to [place]. Mother picked up 11-13-20 then again 11-27-20, asking for another refill.  Per sylvia it is too soon.  Insurance states 10 days.  Sunday Spillers stated she will work on over ride for this for pt.  I relayed to mom that working on overide, she seemed confused on dates, each rx has 2 in it  she has used one this am, and has another one if she needs it.  She stated she did not feel like the vagal nerve stimulator is working as well as it was.  Appt with Dr. April Manson in Dec. 2022. Thoughts, does she need to come in sooner?

## 2020-12-02 NOTE — Telephone Encounter (Signed)
Pt's mother called requesting refill for Midazolam (NAYZILAM) 5 MG/0.1ML SOLN Pharmacy [id number].

## 2020-12-10 ENCOUNTER — Ambulatory Visit: Payer: Medicare Other | Admitting: Neurology

## 2020-12-12 ENCOUNTER — Other Ambulatory Visit: Payer: Self-pay | Admitting: Neurology

## 2020-12-12 ENCOUNTER — Other Ambulatory Visit (HOSPITAL_COMMUNITY): Payer: Self-pay | Admitting: Psychiatry

## 2020-12-12 DIAGNOSIS — F419 Anxiety disorder, unspecified: Secondary | ICD-10-CM

## 2020-12-12 DIAGNOSIS — F321 Major depressive disorder, single episode, moderate: Secondary | ICD-10-CM

## 2020-12-15 ENCOUNTER — Other Ambulatory Visit: Payer: Self-pay

## 2020-12-15 ENCOUNTER — Encounter: Payer: Self-pay | Admitting: Neurology

## 2020-12-15 ENCOUNTER — Ambulatory Visit (INDEPENDENT_AMBULATORY_CARE_PROVIDER_SITE_OTHER): Payer: Medicare Other | Admitting: Neurology

## 2020-12-15 VITALS — BP 109/72 | HR 101 | Ht 67.0 in | Wt 176.0 lb

## 2020-12-15 DIAGNOSIS — Z5181 Encounter for therapeutic drug level monitoring: Secondary | ICD-10-CM | POA: Diagnosis not present

## 2020-12-15 DIAGNOSIS — G40119 Localization-related (focal) (partial) symptomatic epilepsy and epileptic syndromes with simple partial seizures, intractable, without status epilepticus: Secondary | ICD-10-CM

## 2020-12-15 DIAGNOSIS — G40919 Epilepsy, unspecified, intractable, without status epilepticus: Secondary | ICD-10-CM

## 2020-12-15 MED ORDER — CLONAZEPAM 1 MG PO TBDP: 1.0000 mg | ORAL_TABLET | Freq: Two times a day (BID) | ORAL | 0 refills | Status: DC | PRN

## 2020-12-15 NOTE — Progress Notes (Signed)
PATIENT: Cassandra Andrade DOB: 08/26/1965  REASON FOR VISIT: follow up HISTORY FROM: patient PRIMARY NEUROLOGIST: Dr. April Manson  HISTORY OF PRESENT ILLNESS: Today 12/15/20: Patient presents today for follow-up with mother.  Her seizure frequency is currently twice a week.  Mother is using Nayzilam for the seizures, she think that the VNS is not working appropriately as patient is wiping but seizure continues.  She is currently on Lamictal 300 mg twice a day, Vimpat 200 mg twice a day and Keppra 1500 mg in the morning and 2000 mg in the evening.  Mother try 5 mg of Onfi in the past and believe that patient had reacted badly on it (induce seizures) and has not given other medical again.     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  The patient continues to have 1-2 seizure events a week.  She does have a vagal nerve stimulator and tries to use her magnet if she feels an event about to occur.  She states that she typically feels like she is "losing control" before seizure starts.  She typically will try to use her magnet and that will decrease the severity of a seizure.  She remains on Lamictal 300 mg twice a day, Vimpat 200 mg twice a day Keppra 1500 mg in the morning and 2000 mg in evening.  She was prescribed Onfi 5 mg daily however she has not started this.  Patient's mother states that they were concerned about potential side effects.    HISTORY (copied from Dr. 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  The patient has a right above-knee amputation, she has a prosthesis for this but she believes that her ability to walk has declined somewhat over the last several months, she has not had any further falls.  She tries to do exercises.  The patient is still having relatively frequent seizures, but most  of her seizures she can abort with the vagal nerve stimulator.  She returns for further evaluation.  She remains on Lamictal, Vimpat, and Keppra.  She tolerates medications well.  REVIEW OF SYSTEMS: Out of a complete 14 system review of symptoms, the patient complains only of the following symptoms, and all other reviewed systems are negative.  ALLERGIES: Allergies  Allergen Reactions   Vicodin [Hydrocodone-Acetaminophen] Nausea And Vomiting   Morphine And Related Rash    Allergic reaction only in iv form    HOME MEDICATIONS: Outpatient Medications Prior to Visit  Medication Sig Dispense Refill   acetaminophen (TYLENOL) 650 MG CR tablet Take 1,300 mg by mouth every 8 (eight) hours as needed for pain.     Ascorbic Acid (VITAMIN C) 1000 MG tablet Take 1,000 mg by mouth daily.     cloBAZam (ONFI) 10 MG tablet Take 0.5 tablets (5 mg total) by mouth daily. 15 tablet 3   lacosamide (VIMPAT) 200 MG TABS tablet Take 1 tablet (200 mg total) by mouth 2 (two) times daily. 180 tablet 2   lamoTRIgine (LAMICTAL) 100 MG tablet Take 1 tablet (100 mg total) by mouth 2 (two) times daily. 180 tablet 1   lamoTRIgine (LAMICTAL) 200 MG tablet TAKE 1 TABLET BY MOUTH TWICE A DAY 180 tablet 3   lamoTRIgine (LAMICTAL) 25 MG tablet TAKE 2 TABLETS BY MOUTH TWICE A DAY 360 tablet 3   levETIRAcetam (  KEPPRA) 1000 MG tablet TAKE 1.5 TABLETS IN THE MORNING AND 2 TABLETS IN THE EVENING 315 tablet 3   levothyroxine (SYNTHROID, LEVOTHROID) 50 MCG tablet Take 50 mcg by mouth daily before breakfast.     Midazolam (NAYZILAM) 5 MG/0.1ML SOLN One spray nasally as needed for up to one dose. Use if needed for prolonged seizure greater then 5 minutes. 1 each 3   omeprazole (PRILOSEC) 20 MG capsule Take 20 mg by mouth daily.     Prenatal Vit-Fe Fumarate-FA (PRENATAL MULTIVITAMIN) TABS tablet Take 1 tablet by mouth daily at 12 noon.     promethazine (PHENERGAN) 25 MG tablet Take 25 mg by mouth every 8 (eight) hours as needed for  nausea or vomiting.     venlafaxine XR (EFFEXOR-XR) 150 MG 24 hr capsule Take 1 capsule (150 mg total) by mouth daily with breakfast. 90 capsule 0   warfarin (COUMADIN) 5 MG tablet Take 1.5-2 tablets (7.5-10 mg total) by mouth See admin instructions. Pt takes one and a half tablets (7.$RemoveBeforeD'5mg'ysngrPTmhctmmz$  total) on Sunday and Wednesday and 2 tablets ($RemoveBe'10mg'orkggKIjK$  total) all other days. (Patient taking differently: Take 7.5-10 mg by mouth See admin instructions. As per the instructions from coumadin clinic)     No facility-administered medications prior to visit.    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 femur, folllowed by MRSA   TOTAL KNEE ARTHROPLASTY Right     FAMILY HISTORY: Family History  Problem Relation Age of Onset   Anxiety disorder Mother    Diabetes Father    Seizures Neg Hx     SOCIAL HISTORY: Social History   Socioeconomic History   Marital status: Divorced    Spouse name: Not on file   Number of children: 3   Years of education: GED   Highest education level: Not on file  Occupational History   Not on file  Tobacco Use   Smoking status: Never   Smokeless tobacco: Never   Tobacco comments:    never used tobacco  Vaping Use   Vaping Use: Never used  Substance and Sexual Activity   Alcohol use: No    Alcohol/week: 0.0 standard drinks   Drug  use: No   Sexual activity: Not Currently  Other Topics Concern   Not on file  Social History Narrative   Patient lives at home with mom.    Patient has 3 children.    Drinks no caffeine   Right Handed         Social Determinants of Health   Financial Resource Strain: Not on file  Food Insecurity: Not on file  Transportation Needs: Not on file  Physical Activity: Not on file  Stress: Not on file  Social Connections: Not on file  Intimate Partner Violence: Not on file     PHYSICAL EXAM  Vitals:   12/15/20 1107  BP: 109/72  Pulse: (!) 101  Weight: 176 lb (79.8 kg)  Height: $Remove'5\' 7"'YeMMTMS$  (1.702 m)   Body mass index is 27.57 kg/m.  Generalized: Well developed, in no acute distress   Neurological examination  Mentation: Alert oriented to time, place, history taking. Follows all commands speech and language fluent Cranial nerve II-XII: Pupils were equal round reactive to light. Extraocular movements were full, visual field were full on confrontational test. Facial sensation and strength were normal. Uvula tongue midline. Head turning and shoulder shrug  were normal and symmetric. Motor: The motor testing reveals 5 over 5 strength of all 4 extremities.  Above-the-knee amputation on the right. Sensory: Sensory testing is intact to soft touch on all 4 extremities. No evidence of extinction is noted.  Coordination: Cerebellar testing reveals good finger-nose-finger and heel-to-shin bilaterally.  Gait and station: Patient is in a wheelchair   DIAGNOSTIC DATA (LABS, IMAGING, TESTING) - I reviewed patient records, labs, notes, testing and imaging myself where available.  Lab Results  Component Value Date   WBC 4.1 11/12/2020   HGB 12.3 11/12/2020   HCT 39.1 11/12/2020   MCV 91 11/12/2020   PLT 230 11/12/2020      Component Value Date/Time   NA 143 11/12/2020 1210   NA 141 01/12/2017 1125   K 4.6 11/12/2020 1210   K 4.5 01/12/2017 1125   CL 104 11/12/2020 1210   CL 97 (L)  07/11/2013 1346   CO2 24 11/12/2020 1210   CO2 30 (H) 01/12/2017 1125   GLUCOSE 89 11/12/2020 1210   GLUCOSE 84 08/19/2020 1028   GLUCOSE 79 01/12/2017 1125   GLUCOSE 126 (H) 07/11/2013 1346   BUN 20 11/12/2020 1210   BUN 11.2 01/12/2017 1125   CREATININE 1.35 (H) 11/12/2020 1210   CREATININE 0.98 01/11/2018 1116   CREATININE 0.9 01/12/2017 1125   CALCIUM 9.2 11/12/2020 1210   CALCIUM 10.1 01/12/2017 1125   PROT 7.0 11/12/2020 1210   PROT 7.8 01/12/2017 1125   ALBUMIN 4.5 11/12/2020 1210   ALBUMIN 4.0 01/12/2017 1125   AST 28 11/12/2020 1210   AST 23 01/11/2018 1116   AST 34 01/12/2017 1125   ALT 48 (H) 11/12/2020 1210   ALT 22 01/11/2018 1116   ALT 65 (H) 01/12/2017 1125   ALKPHOS 122 (H) 11/12/2020 1210   ALKPHOS 105 01/12/2017 1125   BILITOT <0.2 11/12/2020 1210   BILITOT 0.2 (L) 01/11/2018 1116   BILITOT 0.23 01/12/2017 1125   GFRNONAA >60 08/19/2020 1028   GFRNONAA >60 01/11/2018 1116   GFRAA 69 03/24/2020 1324   GFRAA >60 01/11/2018 1116   No results found for: CHOL, HDL, LDLCALC, LDLDIRECT, TRIG, CHOLHDL Lab Results  Component Value Date   HGBA1C (L) 12/26/2009    4.3 (NOTE)                                                                       According to the ADA Clinical Practice Recommendations for 2011, when HbA1c is used as a screening test:   >=6.5%   Diagnostic of Diabetes Mellitus           (  if abnormal result  is confirmed)  5.7-6.4%   Increased risk of developing Diabetes Mellitus  References:Diagnosis and Classification of Diabetes Mellitus,Diabetes EMLJ,4492,01(EOFHQ 1):S62-S69 and Standards of Medical Care in         Diabetes - 2011,Diabetes RFXJ,8832,54  (Suppl 1):S11-S61.   Lab Results  Component Value Date   DIYMEBRA30 940 02/11/2010   Lab Results  Component Value Date   TSH 2.417 02/17/2013    VNS Interrogated today:  Output current   2 mA Frequency   30 Hz  Pulse Width    250 usec On time    21 sec Off time    1.1 min  Duty Cycle     29 %     Magnet Mode:  Output current   2.25 mA Pulse Width   250 usec  On time    60 sec   ASSESSMENT AND PLAN 55 y.o. year old female  has a past medical history of Anxiety, Arthritis, Cancer ([place]), CVA (cerebrovascular accident due to intracerebral hemorrhage) ([place]) (2004), Depression, DVT (deep venous thrombosis) ([place]), MRSA (methicillin resistant Staphylococcus aureus), S/P BKA (below knee amputation) unilateral ([place]), Seizures ([place]), Thyroid disease, and Unspecified epilepsy with intractable epilepsy (02/28/2013). here with :  Intractable seizures  Continue Lamictal 300 mg twice a day Continue Vimpat 200 mg twice a day Continue Keppra 1500 mg in the morning and 2000 mg at night Continue to use VNS magnet, VNS interrogated today, no changes made We will check ASM level today Encouraged the patient to try Onfi 5 mg daily, to contact me in the morning  Prescribed Klonopin to use as needed if she feels the seizures are coming. Can take an extra dose if needed.  Follow up in 8 weeks.      Hollace Kinnier, MD 12/15/2020, 11:10 AM [facility] [address] [place], [place] [address] [phone]

## 2020-12-15 NOTE — Patient Instructions (Addendum)
Continue current medications, will check levels today  Trial of Clobazam 1/2 tab nightly and update me if there are side effects  Take Klonopin 1 tab if you feel that seizure are  coming, can take another tab after 2 min if seizure not controlled  Continue with Nayzilam as needed for seizure lasting more than 5 min  Return in 8 weeks     Per [facility] statutes, patients with seizures are not allowed to drive until they have been seizure-free for six months.  Other recommendations include using caution when using heavy equipment or power tools. Avoid working on ladders or at heights. Take showers instead of baths.  Do not swim alone.  Ensure the water temperature is not too high on the home water heater. Do not go swimming alone. Do not lock yourself in a room alone (i.e. bathroom). When caring for infants or small children, sit down when holding, feeding, or changing them to minimize risk of injury to the child in the event you have a seizure. Maintain good sleep hygiene. Avoid alcohol.  Also recommend adequate sleep, hydration, good diet and minimize stress.   During the Seizure  - First, ensure adequate ventilation and place patients on the floor on their left side  Loosen clothing around the neck and ensure the airway is patent. If the patient is clenching the teeth, do not force the mouth open with any object as this can cause severe damage - Remove all items from the surrounding that can be hazardous. The patient may be oblivious to what's happening and may not even know what he or she is doing. If the patient is confused and wandering, either gently guide him/her away and block access to outside areas - Reassure the individual and be comforting - Call 911. In most cases, the seizure ends before EMS arrives. However, there are cases when seizures may last over 3 to 5 minutes. Or the individual may have developed breathing difficulties or severe injuries. If a pregnant patient or a  person with diabetes develops a seizure, it is prudent to call an ambulance. - Finally, if the patient does not regain full consciousness, then call EMS. Most patients will remain confused for about 45 to 90 minutes after a seizure, so you must use judgment in calling for help. - Avoid restraints but make sure the patient is in a bed with padded side rails - Place the individual in a lateral position with the neck slightly flexed; this will help the saliva drain from the mouth and prevent the tongue from falling backward - Remove all nearby furniture and other hazards from the area - Provide verbal assurance as the individual is regaining consciousness - Provide the patient with privacy if possible - Call for help and start treatment as ordered by the caregiver   After the Seizure (Postictal Stage)  After a seizure, most patients experience confusion, fatigue, muscle pain and/or a headache. Thus, one should permit the individual to sleep. For the next few days, reassurance is essential. Being calm and helping reorient the person is also of importance.  Most seizures are painless and end spontaneously. Seizures are not harmful to others but can lead to complications such as stress on the lungs, brain and the heart. Individuals with prior lung problems may develop labored breathing and respiratory distress.

## 2020-12-15 NOTE — Procedures (Signed)
    History: 55 yof with intractable epilepsy    VAGAL NERVE STIMULATOR SETTINGS:  Output current: 2 milliamps  Signal frequency: 30 Hz  Pulse width: 250 microseconds  On time: 21 seconds  Off time: 1.1 minutes  Magnet current: 2.25 milliamps  Magnet on time: 60 seconds  Pulse width: 250 microseconds  Lead impedance: 2533 Ohms  IFI: No  The VNS unit was interrogated, and the settings were not changed as above. The patient appeared to tolerate the changes well.   Alric Ran, MD 12/15/2020, 5:03 PM  [facility] [address], [place], [place] [address] [phone]

## 2020-12-18 ENCOUNTER — Telehealth: Payer: Self-pay | Admitting: Neurology

## 2020-12-18 NOTE — Telephone Encounter (Signed)
Spoke with mother, stated that she started Onfi 5 mg nightly and there has not been any bad reaction to the medication.  Since Monday she did have 1 small seizure but they were able to manage per mother.  They have not picked up the Klonopin from the pharmacy because it was not available when they went to pick it up.  I will see her on December 28 as scheduled for follow-up.  Alric Ran, MD

## 2020-12-19 LAB — LAMOTRIGINE LEVEL: Lamotrigine Lvl: 13.7 ug/mL (ref 2.0–20.0)

## 2020-12-19 LAB — LACOSAMIDE: Lacosamide: 16.9 ug/mL — ABNORMAL HIGH (ref 5.0–10.0)

## 2020-12-19 LAB — LEVETIRACETAM LEVEL: Levetiracetam Lvl: 98.4 ug/mL — ABNORMAL HIGH (ref 10.0–40.0)

## 2020-12-31 ENCOUNTER — Other Ambulatory Visit: Payer: Self-pay | Admitting: Neurology

## 2020-12-31 DIAGNOSIS — G40919 Epilepsy, unspecified, intractable, without status epilepticus: Secondary | ICD-10-CM

## 2021-01-29 ENCOUNTER — Encounter (HOSPITAL_COMMUNITY): Payer: Self-pay | Admitting: Psychiatry

## 2021-01-29 ENCOUNTER — Telehealth (HOSPITAL_BASED_OUTPATIENT_CLINIC_OR_DEPARTMENT_OTHER): Payer: Medicare Other | Admitting: Psychiatry

## 2021-01-29 ENCOUNTER — Other Ambulatory Visit: Payer: Self-pay

## 2021-01-29 DIAGNOSIS — F419 Anxiety disorder, unspecified: Secondary | ICD-10-CM | POA: Diagnosis not present

## 2021-01-29 DIAGNOSIS — F321 Major depressive disorder, single episode, moderate: Secondary | ICD-10-CM | POA: Diagnosis not present

## 2021-01-29 MED ORDER — VENLAFAXINE HCL ER 150 MG PO CP24: 150.0000 mg | ORAL_CAPSULE | Freq: Every day | ORAL | 0 refills | Status: DC

## 2021-01-29 NOTE — Progress Notes (Signed)
Virtual Visit via Telephone Note  I connected with Cassandra Andrade on 01/29/21 at  2:40 PM EST by telephone and verified that I am speaking with the correct person using two identifiers.  Location: Patient: Home Provider: Office   I discussed the limitations, risks, security and privacy concerns of performing an evaluation and management service by telephone and the availability of in person appointments. I also discussed with the patient that there may be a patient responsible charge related to this service. The patient expressed understanding and agreed to proceed.   History of Present Illness: Patient is evaluated by phone session.  She endorsed lately disappointment about her progress as despite using the prosthesis she is not able to walk as good.  She has to use a walker.  She admitted not seeing a therapist for a while.  Sometimes she has a ruminative thoughts.  Her mother reported she still have seizures and last seizure was 3 days ago.  She had a good Thanksgiving as able to see her son.  She sleeps okay.  She denies any suicidal thoughts, paranoia or any hallucination but she wished that she can start walking without the help of walker as she started to use prosthesis more frequently.  Her appetite is okay.  Her weight is stable.  She has no tremor or shakes or any EPS.  She is taking the venlafaxine 150 mg daily.  Sometimes she forgetful but denies any issues in her daily activities.  She denies any panic attack.  Past Psychiatric History:  H/O inpatient in 2011 due to overdose on pain medication. No h/o mania or psychosis.  Tried Zoloft and Remeron but do not remember.  Took Lexapro and Abilify but stopped due to lack of response.  Prozac worked for a long time until it stopped working.  Psychiatric Specialty Exam: Physical Exam  Review of Systems  Weight 176 lb (79.8 kg).Body mass index is 27.57 kg/m.  General Appearance: NA  Eye Contact:  NA  Speech:  Normal Rate  Volume:   Decreased  Mood:  Dysphoric  Affect:  NA  Thought Process:  Descriptions of Associations: Intact  Orientation:  Full (Time, Place, and Person)  Thought Content:  Rumination  Suicidal Thoughts:  No  Homicidal Thoughts:  No  Memory:  Immediate;   Fair Recent;   Fair Remote;   Fair  Judgement:  Fair  Insight:  Shallow  Psychomotor Activity:  NA  Concentration:  Concentration: Fair and Attention Span: Fair  Recall:  [employer] of Knowledge:  Good  Language:  Good  Akathisia:  No  Handed:  Right  AIMS (if indicated):     Assets:  Communication Skills Desire for Improvement Housing Social Support  ADL's:  Intact  Cognition:  WNL  Sleep:   fair      Assessment and Plan: Major depressive disorder, recurrent.  Anxiety.  Recommended to reconsider seeing Janett Billow for therapy as patient have ruminative thoughts about her long-term prognosis on her walking.  Despite using prosthesis she still need to use a walker.  We talk about keep the same medication and may consider physical therapy and to discuss with her physician if she can get more sessions.  Treatment plan discussed with the patient and her mother.  We will continue venlafaxine 150 mg daily.  Recommended to call us back if she has any question or any concern.  Follow-up in 3 months.   Follow Up Instructions:    I discussed the assessment and treatment  plan with the patient. The patient was provided an opportunity to ask questions and all were answered. The patient agreed with the plan and demonstrated an understanding of the instructions.   The patient was advised to call back or seek an in-person evaluation if the symptoms worsen or if the condition fails to improve as anticipated.  I provided 18 minutes of non-face-to-face time during this encounter.   Kathlee Nations, MD

## 2021-02-12 ENCOUNTER — Ambulatory Visit: Payer: Medicare Other | Admitting: Neurology

## 2021-02-13 ENCOUNTER — Emergency Department (HOSPITAL_COMMUNITY): Payer: Medicare Other

## 2021-02-13 ENCOUNTER — Inpatient Hospital Stay (HOSPITAL_COMMUNITY)
Admission: EM | Admit: 2021-02-13 | Discharge: 2021-02-19 | DRG: 101 | Disposition: A | Discharge status: Home Health | Payer: Medicare Other | Attending: Internal Medicine | Admitting: Internal Medicine

## 2021-02-13 ENCOUNTER — Encounter (HOSPITAL_COMMUNITY): Payer: Self-pay | Admitting: Radiology

## 2021-02-13 DIAGNOSIS — Z818 Family history of other mental and behavioral disorders: Secondary | ICD-10-CM

## 2021-02-13 DIAGNOSIS — R1110 Vomiting, unspecified: Secondary | ICD-10-CM | POA: Diagnosis present

## 2021-02-13 DIAGNOSIS — Z79890 Hormone replacement therapy (postmenopausal): Secondary | ICD-10-CM

## 2021-02-13 DIAGNOSIS — G40919 Epilepsy, unspecified, intractable, without status epilepticus: Secondary | ICD-10-CM | POA: Diagnosis not present

## 2021-02-13 DIAGNOSIS — G9389 Other specified disorders of brain: Secondary | ICD-10-CM | POA: Diagnosis present

## 2021-02-13 DIAGNOSIS — Z7409 Other reduced mobility: Secondary | ICD-10-CM | POA: Diagnosis present

## 2021-02-13 DIAGNOSIS — Z7901 Long term (current) use of anticoagulants: Secondary | ICD-10-CM

## 2021-02-13 DIAGNOSIS — Z885 Allergy status to narcotic agent status: Secondary | ICD-10-CM

## 2021-02-13 DIAGNOSIS — E876 Hypokalemia: Secondary | ICD-10-CM | POA: Diagnosis present

## 2021-02-13 DIAGNOSIS — Z89619 Acquired absence of unspecified leg above knee: Secondary | ICD-10-CM

## 2021-02-13 DIAGNOSIS — W2209XA Striking against other stationary object, initial encounter: Secondary | ICD-10-CM | POA: Diagnosis present

## 2021-02-13 DIAGNOSIS — G9349 Other encephalopathy: Secondary | ICD-10-CM | POA: Diagnosis present

## 2021-02-13 DIAGNOSIS — R569 Unspecified convulsions: Secondary | ICD-10-CM

## 2021-02-13 DIAGNOSIS — Z8679 Personal history of other diseases of the circulatory system: Secondary | ICD-10-CM

## 2021-02-13 DIAGNOSIS — Z79899 Other long term (current) drug therapy: Secondary | ICD-10-CM

## 2021-02-13 DIAGNOSIS — F419 Anxiety disorder, unspecified: Secondary | ICD-10-CM | POA: Diagnosis present

## 2021-02-13 DIAGNOSIS — R509 Fever, unspecified: Secondary | ICD-10-CM | POA: Diagnosis present

## 2021-02-13 DIAGNOSIS — Z85830 Personal history of malignant neoplasm of bone: Secondary | ICD-10-CM

## 2021-02-13 DIAGNOSIS — M1990 Unspecified osteoarthritis, unspecified site: Secondary | ICD-10-CM | POA: Diagnosis present

## 2021-02-13 DIAGNOSIS — I69354 Hemiplegia and hemiparesis following cerebral infarction affecting left non-dominant side: Secondary | ICD-10-CM

## 2021-02-13 DIAGNOSIS — R29810 Facial weakness: Secondary | ICD-10-CM | POA: Diagnosis present

## 2021-02-13 DIAGNOSIS — Z9229 Personal history of other drug therapy: Secondary | ICD-10-CM

## 2021-02-13 DIAGNOSIS — Z86718 Personal history of other venous thrombosis and embolism: Secondary | ICD-10-CM

## 2021-02-13 DIAGNOSIS — Z89611 Acquired absence of right leg above knee: Secondary | ICD-10-CM

## 2021-02-13 DIAGNOSIS — Z833 Family history of diabetes mellitus: Secondary | ICD-10-CM

## 2021-02-13 DIAGNOSIS — D6862 Lupus anticoagulant syndrome: Secondary | ICD-10-CM | POA: Diagnosis present

## 2021-02-13 DIAGNOSIS — Z789 Other specified health status: Secondary | ICD-10-CM

## 2021-02-13 DIAGNOSIS — F32A Depression, unspecified: Secondary | ICD-10-CM | POA: Diagnosis present

## 2021-02-13 DIAGNOSIS — R471 Dysarthria and anarthria: Secondary | ICD-10-CM | POA: Diagnosis present

## 2021-02-13 DIAGNOSIS — Z20822 Contact with and (suspected) exposure to covid-19: Secondary | ICD-10-CM | POA: Diagnosis present

## 2021-02-13 DIAGNOSIS — E039 Hypothyroidism, unspecified: Secondary | ICD-10-CM | POA: Diagnosis present

## 2021-02-13 LAB — CBC
HCT: 37.1 % (ref 36.0–46.0)
Hemoglobin: 11.8 g/dL — ABNORMAL LOW (ref 12.0–15.0)
MCH: 28.9 pg (ref 26.0–34.0)
MCHC: 31.8 g/dL (ref 30.0–36.0)
MCV: 90.9 fL (ref 80.0–100.0)
Platelets: 168 10*3/uL (ref 150–400)
RBC: 4.08 MIL/uL (ref 3.87–5.11)
RDW: 13 % (ref 11.5–15.5)
WBC: 5 10*3/uL (ref 4.0–10.5)
nRBC: 0 % (ref 0.0–0.2)

## 2021-02-13 LAB — DIFFERENTIAL
Abs Immature Granulocytes: 0.01 10*3/uL (ref 0.00–0.07)
Basophils Absolute: 0 10*3/uL (ref 0.0–0.1)
Basophils Relative: 1 %
Eosinophils Absolute: 0.1 10*3/uL (ref 0.0–0.5)
Eosinophils Relative: 2 %
Immature Granulocytes: 0 %
Lymphocytes Relative: 44 %
Lymphs Abs: 2.2 10*3/uL (ref 0.7–4.0)
Monocytes Absolute: 0.4 10*3/uL (ref 0.1–1.0)
Monocytes Relative: 8 %
Neutro Abs: 2.3 10*3/uL (ref 1.7–7.7)
Neutrophils Relative %: 45 %

## 2021-02-13 LAB — PROTIME-INR
INR: 1.8 — ABNORMAL HIGH (ref 0.8–1.2)
Prothrombin Time: 20.6 seconds — ABNORMAL HIGH (ref 11.4–15.2)

## 2021-02-13 LAB — I-STAT BETA HCG BLOOD, ED (MC, WL, AP ONLY): I-stat hCG, quantitative: <5 m[IU]/mL (ref <5)

## 2021-02-13 LAB — COMPREHENSIVE METABOLIC PANEL
ALT: 27 U/L (ref 0–44)
AST: 27 U/L (ref 15–41)
Albumin: 3.8 g/dL (ref 3.5–5.0)
Alkaline Phosphatase: 116 U/L (ref 38–126)
Anion gap: 7 (ref 5–15)
BUN: 13 mg/dL (ref 6–20)
CO2: 30 mmol/L (ref 22–32)
Calcium: 9.5 mg/dL (ref 8.9–10.3)
Chloride: 102 mmol/L (ref 98–111)
Creatinine, Ser: 1.17 mg/dL — ABNORMAL HIGH (ref 0.44–1.00)
GFR, Estimated: 55 mL/min — ABNORMAL LOW (ref >60)
Glucose, Bld: 97 mg/dL (ref 70–99)
Potassium: 3.6 mmol/L (ref 3.5–5.1)
Sodium: 139 mmol/L (ref 135–145)
Total Bilirubin: 0.5 mg/dL (ref 0.3–1.2)
Total Protein: 7 g/dL (ref 6.5–8.1)

## 2021-02-13 LAB — I-STAT CHEM 8, ED
BUN: 15 mg/dL (ref 6–20)
Calcium, Ion: 1.18 mmol/L (ref 1.15–1.40)
Chloride: 102 mmol/L (ref 98–111)
Creatinine, Ser: 1 mg/dL (ref 0.44–1.00)
Glucose, Bld: 92 mg/dL (ref 70–99)
HCT: 39 % (ref 36.0–46.0)
Hemoglobin: 13.3 g/dL (ref 12.0–15.0)
Potassium: 3.6 mmol/L (ref 3.5–5.1)
Sodium: 141 mmol/L (ref 135–145)
TCO2: 29 mmol/L (ref 22–32)

## 2021-02-13 LAB — APTT: aPTT: 38 seconds — ABNORMAL HIGH (ref 24–36)

## 2021-02-13 MED ORDER — LACOSAMIDE 50 MG PO TABS: 200.0000 mg | ORAL_TABLET | Freq: Two times a day (BID) | ORAL | Status: DC

## 2021-02-13 MED ORDER — LAMOTRIGINE 100 MG PO TABS: 200.0000 mg | ORAL_TABLET | Freq: Two times a day (BID) | ORAL | Status: DC

## 2021-02-13 MED ORDER — SODIUM CHLORIDE 0.9 % IV SOLN
2000.0000 mg | Freq: Every day | INTRAVENOUS | Status: DC
  Administered 2021-02-13: 2000 mg via INTRAVENOUS
  Filled 2021-02-13 (×7): qty 20

## 2021-02-13 MED ORDER — IOHEXOL 350 MG/ML SOLN
75.0000 mL | Freq: Once | INTRAVENOUS | Status: AC | PRN
  Administered 2021-02-13: 23:00:00 75 mL via INTRAVENOUS

## 2021-02-13 MED ORDER — LAMOTRIGINE 100 MG PO TABS
100.0000 mg | ORAL_TABLET | Freq: Two times a day (BID) | ORAL | Status: DC
Start: 2021-02-13 — End: 2021-02-13

## 2021-02-13 MED ORDER — SODIUM CHLORIDE 0.9 % IV SOLN
200.0000 mg | Freq: Two times a day (BID) | INTRAVENOUS | Status: DC
  Administered 2021-02-13: 200 mg via INTRAVENOUS
  Filled 2021-02-13 (×3): qty 20

## 2021-02-13 MED ORDER — LACOSAMIDE 200 MG PO TABS
200.0000 mg | ORAL_TABLET | Freq: Two times a day (BID) | ORAL | Status: DC
  Administered 2021-02-14: 14:00:00 200 mg via ORAL
  Filled 2021-02-13: qty 1

## 2021-02-13 MED ORDER — CLONAZEPAM 0.25 MG PO TBDP
1.0000 mg | ORAL_TABLET | Freq: Two times a day (BID) | ORAL | Status: DC | PRN
  Administered 2021-02-15 – 2021-02-16 (×2): 1 mg via ORAL
  Filled 2021-02-13 (×2): qty 4

## 2021-02-13 MED ORDER — LEVETIRACETAM IN NACL 1500 MG/100ML IV SOLN
1500.0000 mg | Freq: Every morning | INTRAVENOUS | Status: DC
  Administered 2021-02-14: 07:00:00 1500 mg via INTRAVENOUS
  Filled 2021-02-13 (×6): qty 100

## 2021-02-13 MED ORDER — LAMOTRIGINE 25 MG PO TABS
350.0000 mg | ORAL_TABLET | Freq: Two times a day (BID) | ORAL | Status: DC
  Administered 2021-02-14 – 2021-02-19 (×12): 350 mg via ORAL
  Filled 2021-02-13 (×13): qty 2

## 2021-02-13 MED ORDER — SODIUM CHLORIDE 0.9% FLUSH
3.0000 mL | Freq: Once | INTRAVENOUS | Status: AC
Start: 2021-02-13 — End: 2021-02-13
  Administered 2021-02-13: 22:00:00 3 mL via INTRAVENOUS

## 2021-02-13 MED ORDER — LEVETIRACETAM 750 MG PO TABS
2000.0000 mg | ORAL_TABLET | Freq: Every day | ORAL | Status: DC
  Administered 2021-02-14 – 2021-02-18 (×5): 2000 mg via ORAL
  Filled 2021-02-13 (×5): qty 1

## 2021-02-13 MED ORDER — LEVETIRACETAM 500 MG PO TABS: 1500.0000 mg | ORAL_TABLET | Freq: Two times a day (BID) | ORAL | Status: DC

## 2021-02-13 MED ORDER — LEVETIRACETAM 750 MG PO TABS
1500.0000 mg | ORAL_TABLET | Freq: Every morning | ORAL | Status: DC
  Administered 2021-02-15 – 2021-02-19 (×5): 1500 mg via ORAL
  Filled 2021-02-13 (×5): qty 2

## 2021-02-13 MED ORDER — LAMOTRIGINE 25 MG PO TABS: 50.0000 mg | ORAL_TABLET | Freq: Two times a day (BID) | ORAL | Status: DC

## 2021-02-13 MED ORDER — CLOBAZAM 10 MG PO TABS
5.0000 mg | ORAL_TABLET | Freq: Every day | ORAL | Status: DC
  Administered 2021-02-14 – 2021-02-15 (×3): 5 mg via ORAL
  Filled 2021-02-13 (×2): qty 1
  Filled 2021-02-13: qty 0.5

## 2021-02-13 NOTE — Consult Note (Signed)
NEUROLOGY CONSULTATION NOTE   Date of service: February 13, 2021 Patient Name: Cassandra Andrade MRN:  [id number] DOB:  1966/02/13 Reason for consult: "Stroke code for worsening left sided weakness" Requesting Provider: No att. providers found _ _ _   _ __   _ __ _ _  __ __   _ __   __ _  History of Present Illness  Betsabe Bryk is a 55 y.o. 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  Patient is somnolent, dysrthric speech and is unable to provide reliable history . Most the history obtained from patient's mother who lives with patient.   Mother reports worsening left sided weakness and slurring of speech which fluctuates but has overall worsened over the last 2 weeks. She has also been going to the bathroom very frequently and wetting her pullups over the last week. Over the last 2 days, she has been very sleepy.  She usually has 2-3 seizures a week which is her baseline. She had 2 seizures last week and 3 seizures this week with her last seizure this morning. She was in the bathroom when she had a seizure, she caught herself from falling and hit her head on the toilet paper holder.  She also takes warfarin for DVTs.  No fever in the last few weeks, no sick contacts in the last few weeks. No URI over the last few weeks. Has been going to the bathroom a lot and urinating her pullups over the last week. Always has nausea and takes meclizine. No diarrhea.  Takes clonazepam as needed. She took clonazepam yesterday. This prevents her from going to the hospital.  She was brought in by EMS as a stroke code for worsening left sided weakness with an initial LKW of 1000.  mRS: 2 tNKAse/thrombctomy: not offered, LKW is 2 weeks ago. NIHSS components Score: Comment  1a  Level of Conscious 0[]  1[x]  2[]  3[]      1b LOC Questions 0[x]  1[]  2[]       1c LOC Commands 0[x]  1[]  2[]       2 Best Gaze 0[x]  1[]  2[]       3 Visual 0[]  1[]  2[x]  3[]      4 Facial Palsy 0[]  1[]  2[x]  3[]      5a Motor Arm - left 0[]  1[x]  2[]  3[]  4[]  UN[]    5b Motor Arm - Right 0[x]  1[]  2[]  3[]  4[]  UN[]    6a Motor Leg - Left 0[x]  1[]  2[]  3[]  4[]  UN[]    6b Motor Leg - Right 0[]  1[]  2[]  3[]  4[]  UN[x]  R leg amputated  7 Limb Ataxia 0[x]  1[]  2[]  3[]  UN[]     8 Sensory 0[x]  1[]  2[]  UN[]      9 Best Language 0[x]  1[]  2[]  3[]      10 Dysarthria 0[]  1[x]  2[]  UN[]      11 Extinct. and Inattention 0[x]  1[]  2[]       TOTAL: 7      ROS   Unable to obtain due to somnolence.  Past History   Past Medical History:  Diagnosis Date   Anxiety    Arthritis    Cancer ([place])    Osteosarcoma, right leg   CVA (cerebrovascular accident due to intracerebral hemorrhage) ([place]) 2004   Depression    DVT (deep venous thrombosis) ([facility])    Left leg  MRSA (methicillin resistant Staphylococcus aureus)    S/P BKA (below knee amputation) unilateral ([facility])    Right    Seizures ([place])    Thyroid disease    Unspecified epilepsy with intractable epilepsy 02/28/2013   Past Surgical History:  Procedure Laterality Date   ABOVE KNEE LEG AMPUTATION Right    Osteosarcoma   BRAIN SURGERY  2004   cerebral aneurysm   FRACTURE SURGERY     Rt. femur, folllowed by MRSA   TOTAL KNEE ARTHROPLASTY Right    Family History  Problem Relation Age of Onset   Anxiety disorder Mother    Diabetes Father    Seizures Neg Hx    Social History   Socioeconomic History   Marital status: Divorced    Spouse name: Not on file   Number of children: 3   Years of education: GED   Highest education level: Not on file  Occupational History   Not on file  Tobacco Use   Smoking status: Never   Smokeless tobacco: Never   Tobacco comments:    never used tobacco  Vaping Use   Vaping Use: Never used  Substance and Sexual Activity    Alcohol use: No    Alcohol/week: 0.0 standard drinks   Drug use: No   Sexual activity: Not Currently  Other Topics Concern   Not on file  Social History Narrative   Patient lives at home with mom.    Patient has 3 children.    Drinks no caffeine   Right Handed         Social Determinants of Health   Financial Resource Strain: Not on file  Food Insecurity: Not on file  Transportation Needs: Not on file  Physical Activity: Not on file  Stress: Not on file  Social Connections: Not on file   Allergies  Allergen Reactions   Vicodin [Hydrocodone-Acetaminophen] Nausea And Vomiting   Morphine And Related Rash    Allergic reaction only in iv form    Medications  (Not in a hospital admission)    Vitals   There were no vitals filed for this visit.   There is no height or weight on file to calculate BMI.  Physical Exam   General: Laying comfortably in bed; in no acute distress.  HENT: Normal oropharynx and mucosa. Normal external appearance of ears and nose.  Neck: Supple, no pain or tenderness  CV: No JVD. No peripheral edema.  Pulmonary: Symmetric Chest rise. Normal respiratory effort.  Abdomen: Soft to touch, non-tender.  Ext: No cyanosis, edema, right leg amputated Skin: No rash. Normal palpation of skin.   Musculoskeletal: Normal digits and nails by inspection. No clubbing.  Neurologic Examination  Mental status/Cognition: Somnolent, keeps eyes open for few seconds only, oriented to self, place, month and year, poor  attention. Speech/language: Dysarthric speech, non fluent, comprehension intact, object naming intact, repetition intact. Cranial nerves:   CN II Pupils equal and reactive to light, L hemianopsia.   CN III,IV,VI EOM intact, no gaze preference or deviation, no nystagmus   CN V normal sensation in V1, V2, and V3 segments bilaterally   CN VII L facial droop   CN VIII normal hearing to speech   CN IX & X normal palatal elevation, no uvular deviation    CN XI 5/5 head turn and 5/5 shoulder shrug bilaterally   CN XII midline tongue protrusion   Motor:  Muscle bulk: poor, tone normal Mvmt Root Nerve  Muscle Right Left Comments  SA  C5/6 Ax Deltoid 5 4+   EF C5/6 Mc Biceps 5 5   EE C6/7/8 Rad Triceps 5 5   WF C6/7 Med FCR     WE C7/8 PIN ECU     F Ab C8/T1 U ADM/FDI 5 4+   HF L1/2/3 Fem Illopsoas - 4+   KE L2/3/4 Fem Quad - 5   DF L4/5 D Peron Tib Ant - 5   PF S1/2 Tibial Grc/Sol - 5    Reflexes:  Right Left Comments  Pectoralis      Biceps (C5/6) 2 2   Brachioradialis (C5/6) 2 2    Triceps (C6/7) 2 2    Patellar (L3/4) - 2    Achilles (S1)      Hoffman      Plantar  mute   Jaw jerk    Sensation:  Light touch Intact throughout   Pin prick    Temperature    Vibration   Proprioception    Coordination/Complex Motor:  - Finger to Nose slowed and unable to do due to somnolence but no obvious ataxia. - Heel to shin unable to do 2/2 R leg amputation. - Rapid alternating movement are slowed throughout - Gait: uses wheelchair at bas[place]ien 2/2 R leg amputation.  Labs   CBC: No results for input(s): WBC, NEUTROABS, HGB, HCT, MCV, PLT in the last 168 hours.  Basic Metabolic Panel:  Lab Results  Component Value Date   NA 143 11/12/2020   K 4.6 11/12/2020   CO2 24 11/12/2020   GLUCOSE 89 11/12/2020   BUN 20 11/12/2020   CREATININE 1.35 (H) 11/12/2020   CALCIUM 9.2 11/12/2020   GFRNONAA >60 08/19/2020   GFRAA 69 03/24/2020   Lipid Panel: No results found for: LDLCALC HgbA1c:  Lab Results  Component Value Date   HGBA1C (L) 12/26/2009    4.3 (NOTE)                                                                       According to the ADA Clinical Practice Recommendations for 2011, when HbA1c is used as a screening test:   >=6.5%   Diagnostic of Diabetes Mellitus           (if abnormal result  is confirmed)  5.7-6.4%   Increased risk of developing Diabetes Mellitus  References:Diagnosis and Classification of Diabetes  [place],Diabetes ERDE,0814,48(JEHUD 1):S62-S69 and Standards of Medical Care in         Diabetes - 2011,Diabetes Care,2011,34  (Suppl 1):S11-S61.   Urine Drug Screen:     Component Value Date/Time   LABOPIA POSITIVE (A) 02/17/2013 0544   COCAINSCRNUR NONE DETECTED 02/17/2013 0544   LABBENZ NONE DETECTED 02/17/2013 0544   AMPHETMU NONE DETECTED 02/17/2013 0544   THCU NONE DETECTED 02/17/2013 0544   LABBARB NONE DETECTED 02/17/2013 0544    Alcohol Level     Component Value Date/Time   ETH <11 02/17/2013 0630    CT Head without contrast(Personally reviewed): R MCA encephalomalacia stable from prior imaging. CTH was negative for a large hypodensity concerning for a large territory infarct or hyperdensity concerning for an ICH. ASPECT of 10.  CT angio Head and Neck with contrast(Personally reviewed): No LVO, severe R PCA P2 segment stenosis.  MRI Brain: pending  cEEG:  pending  Impression   Shalaya Hulett is a 55 y.o. 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 Her neurologic examination is notable for Left facial droop with dysarthric speech and somnoelnce, symptoms seem to have gradually worsened over the last 2 weeks with 2 days of increased somnolence. I suspect that this is likely recrudescence of prior stroke symptoms in the setting of a potential infectious or metabolic abnormality.   Family expressed concerns about a potential UTI.  Would recommend MRI Brain and a routine EEG along with infectious/metabolic workup.  Recommendations  - MRI Brain without contrast - routine EEG. - Recommend evaluation for infectious/metabolic abnormalities with UA, CXR, Lactate, Procalcitonin, CBC, CMP. - Resume home AEDs including Keppra 1500mg  in AM and  2000mg  in PM, Lamictal 350mg  BID, Vimpat 200mg  BID, Onfi 5mg  qhs. - Seizure precautions along with Ativan 1-2mg  as needed for seizure lasting more than 3 mins.  ______________________________________________________________________   Thank you for the opportunity to take part in the care of this patient. If you have any further questions, please contact the neurology consultation attending.  Signed,  [place] Pager Number [phone] _ _ _   _ __   _ __ _ _  __ __   _ __   __ _

## 2021-02-13 NOTE — ED Provider Notes (Signed)
[facility] EMERGENCY DEPARTMENT Provider Note   CSN: [id number] Arrival date & time: 02/13/21  2126  An emergency department physician performed an initial assessment on this suspected stroke patient at 2127.  History Chief Complaint  Patient presents with   Code Stroke    Cassandra Andrade is a 55 y.o. 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 LKN per EMS is 1000. Patient is unable to contribute to the history due to dysarthria and somnolence.  Per family, the patient has baseline left sided weakness and mild dysarthria from her prior hemorrhagic stroke 2/2 aneurysm rupture. This fluctuates at baseline but has been worse over the past two weeks. Over the past couple of days, the patient has also been more sleepy than usual and has accidentally urinated on herself with increased urinary frequency. She has not had any fevers or URI symptoms.  The patient has refractory epilepsy and typically has about 2-3 seizures per week. She reportedly had a seizure this morning while she was in the bathroom. She takes clonazepam PRN for breakthrough seizures. The patient lives with her mother.     Past Medical History:  Diagnosis Date   Anxiety    Arthritis    Cancer ([place])    Osteosarcoma, right leg   CVA (cerebrovascular accident due to intracerebral hemorrhage) ([place]) 2004   Depression    DVT (deep venous thrombosis) (H[facility])    Left leg   MRSA (methicillin resistant Staphylococcus aureus)    S/P BKA (below knee amputation) unilateral ([place])    Right    Seizures ([place])    Thyroid disease    Unspecified epilepsy with intractable epilepsy 02/28/2013    Patient Active Problem List   Diagnosis Date Noted   History of deep venous thrombosis  05/05/2015   Personal history of osteosarcoma 05/05/2015   Anemia 11/04/2014   Abnormal finding on mammography 05/18/2014   Major depression, recurrent ([place]) 03/19/2014   Vision disturbance 02/28/2014   Postprocedural state 12/31/2013   History of biliary T-tube placement 12/31/2013   Lupus anticoagulant disorder ([place]) 12/20/2013   Back pain, chronic 11/07/2013   Absence of bladder continence 08/05/2013   History of anticoagulant therapy 07/17/2013   Long term current use of anticoagulant therapy 07/17/2013   Partial epilepsy with impairment of consciousness ([place]) 06/21/2013   Intractable epilepsy ([place]) 02/28/2013   Absolute anemia 02/28/2013   Seizures ([place]) 02/21/2013   Seizure ([place]) 02/17/2013   DVT, bilateral lower limbs ([place]) 11/07/2012   Hypothyroid 07/04/2012   Seizure disorder ([place]) 07/04/2012   Chronic pain syndrome 07/04/2012   S/P BKA (below knee amputation) ([place]) 07/04/2012   Chronic pain associated with significant psychosocial dysfunction 07/04/2012   Cerebrovascular accident, late effects 06/05/2012   Status post above knee amputation 06/08/2011   Mechanical complication of graft of bone, cartilage, muscle or tendon 05/10/2011   Depression 05/03/2011   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 femur, folllowed by MRSA   TOTAL KNEE ARTHROPLASTY Right      OB History   No obstetric history on file.  Family History  Problem Relation Age of Onset   Anxiety disorder Mother    Diabetes Father    Seizures Neg Hx     Social History   Tobacco Use   Smoking status: Never   Smokeless tobacco: Never   Tobacco comments:    never used tobacco  Vaping Use   Vaping Use: Never used  Substance Use Topics   Alcohol use: No    Alcohol/week: 0.0 standard drinks   Drug use: No     Home Medications Prior to Admission medications   Medication Sig Start Date End Date Taking? Authorizing Provider  acetaminophen (TYLENOL) 650 MG CR tablet Take 1,300 mg by mouth every 8 (eight) hours as needed for pain.   Yes [provider]  Ascorbic Acid (VITAMIN C) 1000 MG tablet Take 1,000 mg by mouth daily. 08/17/11  Yes [provider]  cloBAZam (ONFI) 10 MG tablet Take 0.5 tablets (5 mg total) by mouth daily. 08/26/20  Yes Kathrynn Ducking, MD  clonazePAM (KLONOPIN) 1 MG disintegrating tablet Take 1 tablet (1 mg total) by mouth 2 (two) times daily as needed for seizure. 12/15/20  Yes Camara, Maryan Puls, MD  ketoconazole (NIZORAL) 2 % shampoo Apply 1 application topically 2 (two) times a week. Monday,wednesday 02/05/21  Yes [provider]  lacosamide (VIMPAT) 200 MG TABS tablet TAKE 1 TABLET BY MOUTH TWICE A DAY Patient taking differently: Take 200 mg by mouth 2 (two) times daily. 12/31/20  Yes Alric Ran, MD  lamoTRIgine (LAMICTAL) 100 MG tablet Take 1 tablet (100 mg total) by mouth 2 (two) times daily. Patient taking differently: Take 100 mg by mouth 2 (two) times daily. Take along with 200 mg tablet 11/12/20  Yes Millikan, Jinny Blossom, NP  lamoTRIgine (LAMICTAL) 200 MG tablet TAKE 1 TABLET BY MOUTH TWICE A DAY Patient taking differently: Take 200 mg by mouth 2 (two) times daily. Take along with 100 mg tablet 08/27/20  Yes Kathrynn Ducking, MD  levETIRAcetam (KEPPRA) 1000 MG tablet TAKE 1.5 TABLETS IN THE MORNING AND 2 TABLETS IN THE EVENING Patient taking differently: Take 1,500-2,000 mg by mouth See admin instructions. 1500 mg in the morning 2000 mg at bedtime 12/15/20  Yes Kathrynn Ducking, MD  levothyroxine (SYNTHROID, LEVOTHROID) 50 MCG tablet Take 50 mcg by mouth every evening.   Yes [provider]  Midazolam (NAYZILAM) 5 MG/0.1ML SOLN One spray nasally as needed for up to one dose. Use if needed for prolonged seizure greater then 5 minutes. Patient  taking differently: Place 5 mg into the nose See admin instructions. Use if needed for prolonged seizure greater then 5 minutes. 11/13/20  Yes Ward Givens, NP  omeprazole (PRILOSEC) 20 MG capsule Take 20 mg by mouth at bedtime. 06/12/19  Yes [provider]  Prenatal Vit-Fe Fumarate-FA (PRENATAL MULTIVITAMIN) TABS tablet Take 1 tablet by mouth daily.   Yes [provider]  promethazine (PHENERGAN) 25 MG tablet Take 25 mg by mouth every 8 (eight) hours as needed for nausea or vomiting. 04/27/19  Yes [provider]  topiramate (TOPAMAX) 25 MG tablet Take 25 mg by mouth 2 (two) times daily as needed (pain). 05/07/20  Yes [provider]  venlafaxine XR (EFFEXOR-XR) 150 MG 24 hr capsule Take 1 capsule (150 mg total) by mouth daily with breakfast. 01/29/21  Yes Arfeen, Arlyce Harman, MD  warfarin (COUMADIN) 5 MG tablet Take 1.5-2 tablets (7.5-10 mg total) by mouth See admin instructions. Pt takes one and a half tablets (7.5mg   total) on Sunday and Wednesday and 2 tablets (10mg  total) all other days. Patient taking differently: Take 7.5-10 mg by mouth See admin instructions. 5 mg Friday, Monday 7.5 mg Saturday,Sunday 01/03/18  Yes Thurnell Lose, MD  lamoTRIgine (LAMICTAL) 25 MG tablet TAKE 2 TABLETS BY MOUTH TWICE A DAY Patient not taking: Reported on 02/13/2021 12/15/20   Kathrynn Ducking, MD    Allergies    Vicodin [hydrocodone-acetaminophen] and Morphine and related  Review of Systems   Review of Systems  Unable to perform ROS: Mental status change   Physical Exam Updated Vital Signs BP (!) 141/77    Pulse 78    Temp (!) 97.5 F (36.4 C) (Oral)    Resp 15    Ht 5\' 6"  (1.676 m)    SpO2 100%    BMI 28.41 kg/m   Physical Exam Vitals and nursing note reviewed.  Constitutional:      Appearance: She is well-developed. She is ill-appearing.  HENT:     Head: Normocephalic and atraumatic.     Right Ear: External ear normal.     Left Ear: External ear normal.      Nose: Nose normal.     Mouth/Throat:     Mouth: Mucous membranes are moist.     Pharynx: Oropharynx is clear.  Eyes:     Extraocular Movements: Extraocular movements intact.     Conjunctiva/sclera: Conjunctivae normal.     Pupils: Pupils are equal, round, and reactive to light.  Cardiovascular:     Rate and Rhythm: Normal rate and regular rhythm.     Pulses: Normal pulses.     Heart sounds: Normal heart sounds. No murmur heard. Pulmonary:     Effort: Pulmonary effort is normal. No respiratory distress.     Breath sounds: Normal breath sounds.  Abdominal:     Palpations: Abdomen is soft.     Tenderness: There is no abdominal tenderness. There is no guarding or rebound.  Musculoskeletal:        General: No swelling.     Cervical back: Neck supple.  Skin:    General: Skin is warm and dry.     Capillary Refill: Capillary refill takes less than 2 seconds.  Neurological:     Mental Status: She is oriented to person, place, and time and easily aroused. She is lethargic.     GCS: GCS eye subscore is 3. GCS verbal subscore is 5. GCS motor subscore is 6.     Cranial Nerves: Dysarthria and facial asymmetry (L facial droop) present.     Sensory: No sensory deficit.     Motor: Weakness (L sided) present.    ED Results / Procedures / Treatments   Labs (all labs ordered are listed, but only abnormal results are displayed) Labs Reviewed  PROTIME-INR - Abnormal; Notable for the following components:      Result Value   Prothrombin Time 20.6 (*)    INR 1.8 (*)    All other components within normal limits  APTT - Abnormal; Notable for the following components:   aPTT 38 (*)    All other components within normal limits  CBC - Abnormal; Notable for the following components:   Hemoglobin 11.8 (*)    All other components within normal limits  COMPREHENSIVE METABOLIC PANEL - Abnormal; Notable for the following components:   Creatinine, Ser 1.17 (*)    GFR, Estimated 55 (*)    All other  components within normal limits  RESP PANEL BY  RT-PCR (FLU A&B, COVID) ARPGX2  DIFFERENTIAL  URINALYSIS, ROUTINE W REFLEX MICROSCOPIC  I-STAT CHEM 8, ED  CBG MONITORING, ED  I-STAT BETA HCG BLOOD, ED ([facility], [facility], [facility] ONLY)    EKG None  Radiology CT HEAD CODE STROKE WO CONTRAST  Result Date: 02/13/2021 CLINICAL DATA:  Code stroke. Left facial droop with confusion and slurred speech EXAM: CT HEAD WITHOUT CONTRAST TECHNIQUE: Contiguous axial images were obtained from the base of the skull through the vertex without intravenous contrast. COMPARISON:  08/19/2020 FINDINGS: Brain: There is no mass, hemorrhage or extra-axial collection. There is encephalomalacia of the right frontal operculum and anterior right temporal lobe. Ex vacuo dilatation of the right lateral ventricle. Vascular: No abnormal hyperdensity of the major intracranial arteries or dural venous sinuses. No intracranial atherosclerosis. Skull: Remote right-sided craniotomy. Sinuses/Orbits: No fluid levels or advanced mucosal thickening of the visualized paranasal sinuses. No mastoid or middle ear effusion. The orbits are normal. ASPECTS [facility] Stroke Program Early CT Score) - Ganglionic level infarction (caudate, lentiform nuclei, internal capsule, insula, M1-M3 cortex): 7 - Supraganglionic infarction (M4-M6 cortex): 3 Total score (0-10 with 10 being normal): 10 IMPRESSION: 1. No acute intracranial abnormality. 2. ASPECTS is 10. 3. Encephalomalacia of the right frontal operculum and anterior right temporal lobe. These results were communicated to Dr. Donnetta Simpers at 9:44 pm on 02/13/2021 by text page via the [facility] messaging system. Electronically Signed   By: Ulyses Jarred M.D.   On: 02/13/2021 21:44   CT ANGIO HEAD NECK W WO CM (CODE STROKE)  Result Date: 02/13/2021 CLINICAL DATA:  Left-sided weakness and slurred speech EXAM: CT ANGIOGRAPHY HEAD AND NECK TECHNIQUE: Multidetector CT imaging of the head and neck was performed using the  standard protocol during bolus administration of intravenous contrast. Multiplanar CT image reconstructions and MIPs were obtained to evaluate the vascular anatomy. Carotid stenosis measurements (when applicable) are obtained utilizing NASCET criteria, using the distal internal carotid diameter as the denominator. CONTRAST:  30mL OMNIPAQUE IOHEXOL 350 MG/ML SOLN COMPARISON:  None. FINDINGS: CTA NECK FINDINGS SKELETON: There is no bony spinal canal stenosis. No lytic or blastic lesion. OTHER NECK: Normal pharynx, larynx and major salivary glands. No cervical lymphadenopathy. Unremarkable thyroid gland. UPPER CHEST: No pneumothorax or pleural effusion. No nodules or masses. AORTIC ARCH: There is no calcific atherosclerosis of the aortic arch. There is no aneurysm, dissection or hemodynamically significant stenosis of the visualized portion of the aorta. Conventional 3 vessel aortic branching pattern. The visualized proximal subclavian arteries are widely patent. RIGHT CAROTID SYSTEM: Normal without aneurysm, dissection or stenosis. LEFT CAROTID SYSTEM: Normal without aneurysm, dissection or stenosis. VERTEBRAL ARTERIES: Left dominant configuration. Both origins are clearly patent. There is no dissection, occlusion or flow-limiting stenosis to the skull base (V1-V3 segments). CTA HEAD FINDINGS POSTERIOR CIRCULATION: --Vertebral arteries: Normal V4 segments. --Inferior cerebellar arteries: Normal. --Basilar artery: Normal. --Superior cerebellar arteries: Normal. --Posterior cerebral arteries (PCA): Severe right P2 segment stenosis. Left PCA is normal. ANTERIOR CIRCULATION: --Intracranial internal carotid arteries: Normal. --Anterior cerebral arteries (ACA): Normal. Both A1 segments are present. Patent anterior communicating artery (a-comm). --Middle cerebral arteries (MCA): Normal. VENOUS SINUSES: As permitted by contrast timing, patent. ANATOMIC VARIANTS: None Review of the MIP images confirms the above findings.  IMPRESSION: 1. No emergent large vessel occlusion. 2. Severe right PCA P2 segment stenosis. Aortic Atherosclerosis (ICD10-I70.0). Electronically Signed   By: Ulyses Jarred M.D.   On: 02/13/2021 22:48   CT US GUIDE VASC ACCESS LT NO REPORT  Result Date: 02/13/2021  There is no Radiologist interpretation  for this exam.   Procedures Procedures   Medications Ordered in ED Medications  cloBAZam (ONFI) tablet 5 mg (has no administration in time range)  clonazepam (KLONOPIN) disintegrating tablet 1 mg (has no administration in time range)  lacosamide (VIMPAT) 200 mg in sodium chloride 0.9 % 25 mL IVPB (200 mg Intravenous New Bag/Given 02/13/21 2351)    Or  lacosamide (VIMPAT) tablet 200 mg ( Oral See Alternative 02/13/21 2351)  lamoTRIgine (LAMICTAL) tablet 350 mg (has no administration in time range)  levETIRAcetam (KEPPRA) 2,000 mg in sodium chloride 0.9 % 250 mL IVPB (2,000 mg Intravenous New Bag/Given 02/13/21 2347)    Or  levETIRAcetam (KEPPRA) tablet 2,000 mg ( Oral See Alternative 02/13/21 2347)  levETIRAcetam (KEPPRA) IVPB 1500 mg/ 100 mL premix (has no administration in time range)    Or  levETIRAcetam (KEPPRA) tablet 1,500 mg (has no administration in time range)  sodium chloride flush (NS) 0.9 % injection 3 mL (3 mLs Intravenous Given 02/13/21 2150)  iohexol (OMNIPAQUE) 350 MG/ML injection 75 mL (75 mLs Intravenous Contrast Given 02/13/21 2230)    ED Course  I have reviewed the triage vital signs and the nursing notes.  Pertinent labs & imaging results that were available during my care of the patient were reviewed by me and considered in my medical decision making (see chart for details).    MDM Rules/Calculators/A&P                          Patient presents as a code stroke as described in HPI above.  The patient was immediately assessed by the emergency department team and neurology upon arrival to the emergency department.  Physical exam notable for dysarthria, left  facial droop with left-sided weakness.  The patient was taken probably to the CT scanner where CT code stroke imaging did not reveal any intracranial hemorrhage or LVO.  Patient is not a candidate for thrombolytics as she is on blood thinners.  Neurology believes the patient may be experiencing recrudescence of her prior stroke due to metabolic/infectious etiology.  Increased seizure frequency from baseline and postictal state may be contributing as well.  Neurology recommending hospitalist admission for further metabolic/infectious work-up, MRI, routine EEG.  I discussed the patient with the hospitalist team who will admit the patient.  Home seizure medications reordered.    Final Clinical Impression(s) / ED Diagnoses Final diagnoses:  Difficult intravenous access    Rx / DC Orders ED Discharge Orders     None        Teaira Croft, Amalia Hailey, MD 02/14/21 1746    Lucrezia Starch, MD 02/16/21 [phone]

## 2021-02-13 NOTE — ED Notes (Signed)
Cassandra Andrade (Mother) called asking for an update plese call her back at [phone]

## 2021-02-13 NOTE — ED Notes (Signed)
Pt's son at bedside. Son reports he does not live with the pt, doesn't know what's happening and was told to come to the hospital by his father.

## 2021-02-13 NOTE — Code Documentation (Signed)
Responded to Code Stroke called at 2101 for L sided facial droop, L sided weakness, and slurred speech, LSN originally 1000. Pt arrived at 2126, NIH-7, CT head negative for acute changes. CTA-no LVO. LSN changed to 2 weeks ago after speaking with family. Plan MRI EEG and metabolic workup.

## 2021-02-14 ENCOUNTER — Other Ambulatory Visit: Payer: Self-pay

## 2021-02-14 ENCOUNTER — Encounter (HOSPITAL_COMMUNITY): Payer: Self-pay | Admitting: Internal Medicine

## 2021-02-14 ENCOUNTER — Observation Stay (HOSPITAL_COMMUNITY): Payer: Medicare Other

## 2021-02-14 DIAGNOSIS — R569 Unspecified convulsions: Secondary | ICD-10-CM | POA: Diagnosis not present

## 2021-02-14 DIAGNOSIS — G40919 Epilepsy, unspecified, intractable, without status epilepticus: Secondary | ICD-10-CM | POA: Diagnosis not present

## 2021-02-14 LAB — CBC
HCT: 36.9 % (ref 36.0–46.0)
Hemoglobin: 11.7 g/dL — ABNORMAL LOW (ref 12.0–15.0)
MCH: 28.9 pg (ref 26.0–34.0)
MCHC: 31.7 g/dL (ref 30.0–36.0)
MCV: 91.1 fL (ref 80.0–100.0)
Platelets: 195 K/uL (ref 150–400)
RBC: 4.05 MIL/uL (ref 3.87–5.11)
RDW: 12.9 % (ref 11.5–15.5)
WBC: 4.4 K/uL (ref 4.0–10.5)
nRBC: 0 % (ref 0.0–0.2)

## 2021-02-14 LAB — PROTIME-INR
INR: 1.7 — ABNORMAL HIGH (ref 0.8–1.2)
Prothrombin Time: 20.1 seconds — ABNORMAL HIGH (ref 11.4–15.2)

## 2021-02-14 LAB — BASIC METABOLIC PANEL
Anion gap: 8 (ref 5–15)
BUN: 11 mg/dL (ref 6–20)
CO2: 27 mmol/L (ref 22–32)
Calcium: 9 mg/dL (ref 8.9–10.3)
Chloride: 104 mmol/L (ref 98–111)
Creatinine, Ser: 0.99 mg/dL (ref 0.44–1.00)
GFR, Estimated: >60 mL/min (ref >60)
Glucose, Bld: 105 mg/dL — ABNORMAL HIGH (ref 70–99)
Potassium: 3.4 mmol/L — ABNORMAL LOW (ref 3.5–5.1)
Sodium: 139 mmol/L (ref 135–145)

## 2021-02-14 LAB — URINALYSIS, ROUTINE W REFLEX MICROSCOPIC
Bilirubin Urine: NEGATIVE
Glucose, UA: NEGATIVE mg/dL
Ketones, ur: NEGATIVE mg/dL
Leukocytes,Ua: NEGATIVE
Nitrite: NEGATIVE
Protein, ur: NEGATIVE mg/dL
Specific Gravity, Urine: 1.01 (ref 1.005–1.030)
pH: 7.5 (ref 5.0–8.0)

## 2021-02-14 LAB — URINALYSIS, MICROSCOPIC (REFLEX)

## 2021-02-14 LAB — GLUCOSE, CAPILLARY: Glucose-Capillary: 110 mg/dL — ABNORMAL HIGH (ref 70–99)

## 2021-02-14 LAB — RESP PANEL BY RT-PCR (FLU A&B, COVID) ARPGX2
Influenza A by PCR: NEGATIVE
Influenza B by PCR: NEGATIVE
SARS Coronavirus 2 by RT PCR: NEGATIVE

## 2021-02-14 LAB — MAGNESIUM: Magnesium: 1.7 mg/dL (ref 1.7–2.4)

## 2021-02-14 LAB — CBG MONITORING, ED: Glucose-Capillary: 88 mg/dL (ref 70–99)

## 2021-02-14 LAB — HIV ANTIBODY (ROUTINE TESTING W REFLEX): HIV Screen 4th Generation wRfx: NONREACTIVE

## 2021-02-14 LAB — PROCALCITONIN: Procalcitonin: <0.1 ng/mL

## 2021-02-14 MED ORDER — ENOXAPARIN SODIUM 80 MG/0.8ML IJ SOSY
80.0000 mg | PREFILLED_SYRINGE | Freq: Two times a day (BID) | INTRAMUSCULAR | Status: DC
  Administered 2021-02-14 – 2021-02-16 (×5): 80 mg via SUBCUTANEOUS
  Filled 2021-02-14 (×6): qty 0.8

## 2021-02-14 MED ORDER — DILTIAZEM HCL-DEXTROSE 125-5 MG/125ML-% IV SOLN (PREMIX): 5.0000 mg/h | INTRAVENOUS | Status: DC

## 2021-02-14 MED ORDER — ACETAMINOPHEN 325 MG PO TABS
650.0000 mg | ORAL_TABLET | Freq: Four times a day (QID) | ORAL | Status: DC | PRN
  Administered 2021-02-14 – 2021-02-19 (×7): 650 mg via ORAL
  Filled 2021-02-14 (×7): qty 2

## 2021-02-14 MED ORDER — PANTOPRAZOLE SODIUM 40 MG PO TBEC
40.0000 mg | DELAYED_RELEASE_TABLET | Freq: Every day | ORAL | Status: DC
  Administered 2021-02-14 – 2021-02-18 (×6): 40 mg via ORAL
  Filled 2021-02-14 (×6): qty 1

## 2021-02-14 MED ORDER — ENOXAPARIN SODIUM 80 MG/0.8ML IJ SOSY
78.0000 mg | PREFILLED_SYRINGE | Freq: Two times a day (BID) | INTRAMUSCULAR | Status: DC
  Filled 2021-02-14: qty 0.78

## 2021-02-14 MED ORDER — WARFARIN - PHARMACIST DOSING INPATIENT: Freq: Every day | Status: DC

## 2021-02-14 MED ORDER — ACETAMINOPHEN 650 MG RE SUPP: 650.0000 mg | Freq: Four times a day (QID) | RECTAL | Status: DC | PRN

## 2021-02-14 MED ORDER — TOPIRAMATE 25 MG PO TABS
25.0000 mg | ORAL_TABLET | Freq: Two times a day (BID) | ORAL | Status: DC
  Administered 2021-02-14 – 2021-02-18 (×10): 25 mg via ORAL
  Filled 2021-02-14 (×10): qty 1

## 2021-02-14 MED ORDER — LACOSAMIDE 200 MG PO TABS
200.0000 mg | ORAL_TABLET | Freq: Two times a day (BID) | ORAL | Status: DC
  Administered 2021-02-14 – 2021-02-19 (×10): 200 mg via ORAL
  Filled 2021-02-14 (×10): qty 1

## 2021-02-14 MED ORDER — WARFARIN SODIUM 7.5 MG PO TABS
7.5000 mg | ORAL_TABLET | ORAL | Status: AC
  Administered 2021-02-14: 03:00:00 7.5 mg via ORAL
  Filled 2021-02-14: qty 1

## 2021-02-14 MED ORDER — VENLAFAXINE HCL ER 75 MG PO CP24
150.0000 mg | ORAL_CAPSULE | Freq: Every day | ORAL | Status: DC
  Administered 2021-02-14 – 2021-02-19 (×6): 150 mg via ORAL
  Filled 2021-02-14: qty 2
  Filled 2021-02-14: qty 1
  Filled 2021-02-14 (×4): qty 2

## 2021-02-14 MED ORDER — WARFARIN SODIUM 5 MG PO TABS
10.0000 mg | ORAL_TABLET | Freq: Once | ORAL | Status: AC
  Administered 2021-02-14: 17:00:00 10 mg via ORAL
  Filled 2021-02-14: qty 1
  Filled 2021-02-14: qty 2

## 2021-02-14 MED ORDER — LEVOTHYROXINE SODIUM 50 MCG PO TABS
50.0000 ug | ORAL_TABLET | Freq: Every day | ORAL | Status: DC
  Administered 2021-02-14 – 2021-02-19 (×6): 50 ug via ORAL
  Filled 2021-02-14 (×4): qty 1
  Filled 2021-02-14: qty 2
  Filled 2021-02-14: qty 1

## 2021-02-14 NOTE — ED Notes (Signed)
Breakfast tray ordered 

## 2021-02-14 NOTE — Evaluation (Signed)
Occupational Therapy Evaluation Patient Details Name: Cassandra Andrade MRN: [id number] DOB: 1965-09-15 Today's Date: 02/14/2021   History of Present Illness This 55 y.o. female admitted with increasing somnolence over previous 2 weeks and worsensing of Lt sided weakness.  CT of head negative for acute abnormality, CTA negative for LVO.  MRI pending.  EEG performed.  Neurology feels weakness is recrudescence of prior stroke in setting of possible infectious or metabolic reasons.  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 She demonstrates the below listed deficits and will benefit from continued OT to maximize safety and independence with BADLs.  Pt demonstrates Lt hemiparesis, but she and son indicate Lt UE is close to baseline.  She demonstrates impaired cognition, decreased activity tolerance, and impaired balance.  She currently requires set up to min A for UB ADLs and mod A for LB ADLs.  She requires min - mod A+2 for functional transfers.  Pt reports she lives with her mother who is available to assist as needed.  Pt reports she was mod I- supervision for ADLs, and that she mostly crawls to move from one area to the other - son confirms this info and says family carries her up and down stairs or pt crawls.   She indicates she does stand with RW and does use her prosthesis "some" to practice, but ws unable to provide detailed info as to how often she does that - she initially said once/day, but later said "it's not every day".  Recommend HHOT at discharge and 24 hour assist.         Recommendations for follow up therapy are one component of a multi-disciplinary discharge planning process, led by the attending physician.  Recommendations may be updated based on patient status, additional functional criteria and insurance authorization.   Follow Up  Recommendations       Assistance Recommended at Discharge Frequent or constant Supervision/Assistance  Functional Status Assessment  Patient has had a recent decline in their functional status and demonstrates the ability to make significant improvements in function in a reasonable and predictable amount of time.  Equipment Recommendations  None recommended by OT    Recommendations for Other Services       Precautions / Restrictions Precautions Precautions: Fall;Other (comment) Precaution Comments: seziures; Restrictions Weight Bearing Restrictions: No      Mobility Bed Mobility Overal bed mobility: Needs Assistance Bed Mobility: Supine to Sit;Sit to Supine     Supine to sit: Min guard Sit to supine: Min guard        Transfers Overall transfer level: Needs assistance Equipment used: Rolling walker (2 wheels) Transfers: Sit to/from Stand Sit to Stand: Mod assist;+2 physical assistance;+2 safety/equipment;From elevated surface           General transfer comment: Pt initially required min A +2 to move into standing and min A to maintain static standing, however, she fatigued.  After seated rest break, she tried again, but required mod A+2 for second sit to stand      Balance Overall balance assessment: Needs assistance Sitting-balance support: Feet supported Sitting balance-Leahy Scale: Good     Standing balance support: Reliant on assistive device for balance;Bilateral upper extremity supported Standing balance-Leahy Scale: Poor Standing balance comment: UE support and min A  ADL either performed or assessed with clinical judgement   ADL Overall ADL's : Needs assistance/impaired Eating/Feeding: Set up;Supervision/ safety;Bed level   Grooming: Wash/dry hands;Wash/dry face;Set up;Supervision/safety;Bed level;Sitting   Upper Body Bathing: Minimal assistance;Sitting   Lower Body Bathing: Moderate assistance;Sit to/from  stand   Upper Body Dressing : Minimal assistance;Sitting   Lower Body Dressing: Moderate assistance;Sit to/from stand Lower Body Dressing Details (indicate cue type and reason): Pt able to don sock with min guard assist at edge of stretcher.  Requires asisst to stand and to pull pants over hips Toilet Transfer: Moderate assistance;Stand-pivot;BSC/3in1 Toilet Transfer Details (indicate cue type and reason): initial sit to stand with min A +2, but as she fatigued she required mod A +2 Toileting- Clothing Manipulation and Hygiene: Moderate assistance;Sit to/from stand       Functional mobility during ADLs: Moderate assistance;+2 for physical assistance;+2 for safety/equipment General ADL Comments: Pt requires cues to stay on task     Vision Patient Visual Report: Other (comment) (son reports pt has impaired depth perception at baseline) Additional Comments: will benefit from further assessment     Perception     Praxis      Pertinent Vitals/Pain Pain Assessment: No/denies pain     Hand Dominance Right   Extremity/Trunk Assessment Upper Extremity Assessment Upper Extremity Assessment: LUE deficits/detail;RUE deficits/detail RUE Deficits / Details: Rt UE tremulous.  Pt and son indicate she has tremors at baseline, but indicate that the tremor may be a bit worse, but they were no clear on this RUE Coordination: decreased fine motor LUE Deficits / Details: Pt with AROM shoulder, elbow WFL, impaired ability to opose and impaired [facility] Lt hand which she reports is her baseline LUE Coordination: decreased gross motor;decreased fine motor   Lower Extremity Assessment Lower Extremity Assessment: Defer to PT evaluation   Cervical / Trunk Assessment Cervical / Trunk Assessment: Normal   Communication Communication Communication: No difficulties   Cognition Arousal/Alertness: Awake/alert Behavior During Therapy: WFL for tasks assessed/performed Overall Cognitive Status: History of  cognitive impairments - at baseline                                 General Comments: Son present initially.  Pt provided intermittently incorrect info re: PLOF.   Pt is very tangential and frequently self distracts     General Comments  VSS    Exercises     Shoulder Instructions      Home Living Family/patient expects to be discharged to:: Private residence Living Arrangements: Parent (mother) Available Help at Discharge: Family;Available 24 hours/day Type of Home: House Home Access: Stairs to enter [employer] of Steps: 4 (scoots up/down stairs)   Home Layout: Two level;Bed/bath upstairs (stays upstairs majority of time) Alternate Level Stairs-Number of Steps: flight (scoots up/down stairs)   Bathroom Shower/Tub: Occupational psychologist: Standard     Home Equipment: Conservation officer, nature (2 wheels);Shower seat - built in;Wheelchair - manual;Hospital bed          Prior Functioning/Environment Prior Level of Function : Needs assist       Physical Assist : Mobility (physical);ADLs (physical)     Mobility Comments: Primarily scoots to mobilize around house and on stairs. Intermittently walks when practicing with prosthesis, walks every 1-2 days. Uses w/c when outside the house. ADLs Comments: Pt pulls herself up to her toilet relying heavily on her UEs. Her mother sits outside of the shower when  bathing for safety.        OT Problem List: Decreased strength;Decreased range of motion;Decreased activity tolerance;Impaired balance (sitting and/or standing);Impaired vision/perception;Decreased coordination;Decreased cognition;Decreased safety awareness;Decreased knowledge of use of DME or AE;Impaired UE functional use      OT Treatment/Interventions: Self-care/ADL training;Neuromuscular education;DME and/or AE instruction;Therapeutic activities;Cognitive remediation/compensation;Patient/family education;Balance training    OT Goals(Current  goals can be found in the care plan section) Acute Rehab OT Goals Patient Stated Goal: To go home OT Goal Formulation: With patient Time For Goal Achievement: 02/27/21 Potential to Achieve Goals: Good ADL Goals Pt Will Perform Lower Body Bathing: with min guard assist;sit to/from stand Pt Will Perform Lower Body Dressing: with min guard assist;sit to/from stand Pt Will Transfer to Toilet: with min guard assist;squat pivot transfer;stand pivot transfer;regular height toilet Pt Will Perform Toileting - Clothing Manipulation and hygiene: with min guard assist;sitting/lateral leans  OT Frequency: Min 2X/week   Barriers to D/C:            Co-evaluation PT/OT/SLP Co-Evaluation/Treatment: Yes Reason for Co-Treatment: For patient/therapist safety;To address functional/ADL transfers   OT goals addressed during session: ADL's and self-care      AM-PAC OT "6 Clicks" Daily Activity     Outcome Measure Help from another person eating meals?: A Little Help from another person taking care of personal grooming?: A Little Help from another person toileting, which includes using toliet, bedpan, or urinal?: A Lot Help from another person bathing (including washing, rinsing, drying)?: A Lot Help from another person to put on and taking off regular upper body clothing?: A Little Help from another person to put on and taking off regular lower body clothing?: A Lot 6 Click Score: 15   End of Session Equipment Utilized During Treatment: Rolling walker (2 wheels);Gait belt Nurse Communication: Mobility status  Activity Tolerance: Patient tolerated treatment well Patient left: in bed;with call bell/phone within reach  OT Visit Diagnosis: Unsteadiness on feet (R26.81);Cognitive communication deficit (R41.841);Muscle weakness (generalized) (M62.81);Hemiplegia and hemiparesis Hemiplegia - Right/Left: Left Hemiplegia - dominant/non-dominant: Non-Dominant                Time: 3094-0768 OT Time  Calculation (min): 32 min Charges:  OT General Charges $OT Visit: 1 Visit OT Evaluation $OT Eval Moderate Complexity: 1 Mod  Nilsa Nutting., OTR/L Acute Rehabilitation Services Pager [phone] Office [phone]   Lucille Passy M 02/14/2021, 12:34 PM

## 2021-02-14 NOTE — Progress Notes (Deleted)
Pt with new onset A.Fib RVR with rates 140s (am reviewing monitor now).  Already on coumadin due to h/o DVT and lupus anticoagulant.  Going to start cardizem gtt to try and rate control.

## 2021-02-14 NOTE — Progress Notes (Signed)
Patient ID: Cassandra Andrade, female   DOB: 1965/12/04, 55 y.o.   MRN: [id number] Patient admitted early this morning for left-sided weakness.  Neurology has been consulted.  Patient seen and examined at bedside.  I have reviewed patient's medical records including this morning's H&P, current vitals, labs, medications myself.  Follow neurology recommendations.  Follow MRI of brain and EEG.

## 2021-02-14 NOTE — Procedures (Signed)
Routine EEG Report  Cassandra Andrade is a 55 y.o. female with a history of seizure who is undergoing an EEG to evaluate for seizures.  Report: This EEG was acquired with electrodes placed according to the International 10-20 electrode system (including Fp1, Fp2, F3, F4, C3, C4, P3, P4, O1, O2, T3, T4, T5, T6, A1, A2, Fz, Cz, Pz). The following electrodes were missing or displaced: none.  The occipital dominant rhythm was 9 Hz. This activity is reactive to stimulation. Drowsiness was manifested by background fragmentation; deeper stages of sleep were identified by K complexes and sleep spindles. There was focal slowing over the right frontotemporal region. There were no interictal epileptiform discharges. There were no electrographic seizures identified. There was no abnormal response to photic stimulation. Hyperventilation was not performed.   Impression and clinical correlation: This EEG was obtained while awake and asleep and is abnormal due to focal slowing over the right frontotemporal region, indicating focal cerebral dysfunction in that area.     Su Monks, MD [facility] [phone]  If 7pm- 7am, please page neurology on call as listed in [place].

## 2021-02-14 NOTE — H&P (Signed)
History and Physical    Kameron Glazebrook EYC:[id number] DOB: [birth year] DOA: 02/13/2021  PCP: Berkley Harvey, NP  Patient coming from: Home.  Chief Complaint: Left-sided weakness.  HPI: Cassandra Andrade is a 55 y.o. 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  Patient also had a seizure earlier yesterday when patient was in the bathroom but was caught by patient's mother in order to stop from falling but did hit her head on the toilet roll.  ED Course: In the ER patient was seen by neurologist as a code stroke initially.  CT head CT angiogram head and neck was unremarkable.  At this time neurology feels that patient's symptoms may be due to recrudescence of prior stroke symptoms due to potential metabolic or infectious cause.  Patient admitted for further observation.  Patient on exam appears lethargic but is able to get up and follow commands moving all extremities.  Labs show creatinine of 1.1 CBC unremarkable COVID test negative EKG shows normal sinus rhythm chest x-ray and UA pending.  Patient is afebrile.  Review of Systems: As per HPI, rest all negative.   Past Medical History:  Diagnosis Date   Anxiety    Arthritis    Cancer ([place])    Osteosarcoma, right leg   CVA (cerebrovascular accident due to intracerebral hemorrhage) ([place]) 2004   Depression    DVT (deep venous thrombosis) ([facility])    Left leg   MRSA (methicillin resistant Staphylococcus aureus)    S/P BKA (below knee amputation) unilateral ([place])    Right    Seizures ([place])    Thyroid disease    Unspecified epilepsy with intractable epilepsy 02/28/2013    Past  Surgical History:  Procedure Laterality Date   ABOVE KNEE LEG AMPUTATION Right    Osteosarcoma   BRAIN SURGERY  2004   cerebral aneurysm   FRACTURE SURGERY     Rt. femur, folllowed by MRSA   TOTAL KNEE ARTHROPLASTY Right      reports that she has never smoked. She has never used smokeless tobacco. She reports that she does not drink alcohol and does not use drugs.  Allergies  Allergen Reactions   Vicodin [Hydrocodone-Acetaminophen] Nausea And Vomiting   Morphine And Related Rash    Allergic reaction only in iv form    Family History  Problem Relation Age of Onset   Anxiety disorder Mother    Diabetes Father    Seizures Neg Hx     Prior to Admission medications   Medication Sig Start Date End Date Taking? Authorizing Provider  acetaminophen (TYLENOL) 650 MG CR tablet Take 1,300 mg by mouth every 8 (eight) hours as needed for pain.   Yes [provider]  Ascorbic Acid (VITAMIN C) 1000 MG tablet Take 1,000 mg by mouth daily. 08/17/11  Yes [provider]  cloBAZam (ONFI) 10 MG tablet Take 0.5 tablets (5 mg total) by mouth daily. 08/26/20  Yes Kathrynn Ducking, MD  clonazePAM (KLONOPIN) 1 MG disintegrating tablet Take 1 tablet (1 mg total) by mouth 2 (two) times daily as needed for seizure. 12/15/20  Yes Alric Ran, MD  ketoconazole (NIZORAL) 2 %  shampoo Apply 1 application topically 2 (two) times a week. Monday,wednesday 02/05/21  Yes [provider]  lacosamide (VIMPAT) 200 MG TABS tablet TAKE 1 TABLET BY MOUTH TWICE A DAY Patient taking differently: Take 200 mg by mouth 2 (two) times daily. 12/31/20  Yes Alric Ran, MD  lamoTRIgine (LAMICTAL) 100 MG tablet Take 1 tablet (100 mg total) by mouth 2 (two) times daily. Patient taking differently: Take 100 mg by mouth 2 (two) times daily. Take along with 200 mg tablet 11/12/20  Yes Millikan, Jinny Blossom, NP  lamoTRIgine (LAMICTAL) 200 MG tablet TAKE 1 TABLET BY MOUTH TWICE A DAY Patient taking differently:  Take 200 mg by mouth 2 (two) times daily. Take along with 100 mg tablet 08/27/20  Yes Kathrynn Ducking, MD  levETIRAcetam (KEPPRA) 1000 MG tablet TAKE 1.5 TABLETS IN THE MORNING AND 2 TABLETS IN THE EVENING Patient taking differently: Take 1,500-2,000 mg by mouth See admin instructions. 1500 mg in the morning 2000 mg at bedtime 12/15/20  Yes Kathrynn Ducking, MD  levothyroxine (SYNTHROID, LEVOTHROID) 50 MCG tablet Take 50 mcg by mouth every evening.   Yes [provider]  Midazolam (NAYZILAM) 5 MG/0.1ML SOLN One spray nasally as needed for up to one dose. Use if needed for prolonged seizure greater then 5 minutes. Patient taking differently: Place 5 mg into the nose See admin instructions. Use if needed for prolonged seizure greater then 5 minutes. 11/13/20  Yes Ward Givens, NP  omeprazole (PRILOSEC) 20 MG capsule Take 20 mg by mouth at bedtime. 06/12/19  Yes [provider]  Prenatal Vit-Fe Fumarate-FA (PRENATAL MULTIVITAMIN) TABS tablet Take 1 tablet by mouth daily.   Yes [provider]  promethazine (PHENERGAN) 25 MG tablet Take 25 mg by mouth every 8 (eight) hours as needed for nausea or vomiting. 04/27/19  Yes [provider]  topiramate (TOPAMAX) 25 MG tablet Take 25 mg by mouth 2 (two) times daily as needed (pain). 05/07/20  Yes [provider]  venlafaxine XR (EFFEXOR-XR) 150 MG 24 hr capsule Take 1 capsule (150 mg total) by mouth daily with breakfast. 01/29/21  Yes Arfeen, Arlyce Harman, MD  warfarin (COUMADIN) 5 MG tablet Take 1.5-2 tablets (7.5-10 mg total) by mouth See admin instructions. Pt takes one and a half tablets (7.5mg  total) on Sunday and Wednesday and 2 tablets (10mg  total) all other days. Patient taking differently: Take 7.5-10 mg by mouth See admin instructions. 5 mg Friday, Monday 7.5 mg Saturday,Sunday 01/03/18  Yes Thurnell Lose, MD  lamoTRIgine (LAMICTAL) 25 MG tablet TAKE 2 TABLETS BY MOUTH TWICE A DAY Patient not taking: Reported  on 02/13/2021 12/15/20   Kathrynn Ducking, MD    Physical Exam: Constitutional: Moderately built and nourished. Vitals:   02/13/21 2302 02/13/21 2345  BP: (!) 153/80 (!) 141/77  Pulse: 78 78  Resp: 15 15  Temp: (!) 97.5 F (36.4 C)   TempSrc: Oral   SpO2: 100% 100%  Height: 5\' 6"  (1.676 m)    Eyes: Anicteric no pallor. ENMT: No discharge from the ears eyes nose and mouth. Neck: No mass felt.  No neck rigidity. Respiratory: No rhonchi or crepitations. Cardiovascular: S1-S2 heard. Abdomen: Soft nontender bowel sound present. Musculoskeletal: No edema.  Right AKA. Skin: No rash. Neurologic: Patient is lethargic dysarthric but easily arousable and follows commands.  Pupils equal and reactive to light. Psychiatric: Lethargic.   Labs on Admission: I have personally reviewed following labs and imaging studies  CBC: Recent Labs  Lab  02/13/21 2212 02/13/21 2227  WBC 5.0  --   NEUTROABS 2.3  --   HGB 11.8* 13.3  HCT 37.1 39.0  MCV 90.9  --   PLT 168  --    Basic Metabolic Panel: Recent Labs  Lab 02/13/21 2212 02/13/21 2227  NA 139 141  K 3.6 3.6  CL 102 102  CO2 30  --   GLUCOSE 97 92  BUN 13 15  CREATININE 1.17* 1.00  CALCIUM 9.5  --    GFR: CrCl cannot be calculated (Unknown ideal weight.). Liver Function Tests: Recent Labs  Lab 02/13/21 2212  AST 27  ALT 27  ALKPHOS 116  BILITOT 0.5  PROT 7.0  ALBUMIN 3.8   No results for input(s): LIPASE, AMYLASE in the last 168 hours. No results for input(s): AMMONIA in the last 168 hours. Coagulation Profile: Recent Labs  Lab 02/13/21 2212  INR 1.8*   Cardiac Enzymes: No results for input(s): CKTOTAL, CKMB, CKMBINDEX, TROPONINI in the last 168 hours. BNP (last 3 results) No results for input(s): PROBNP in the last 8760 hours. HbA1C: No results for input(s): HGBA1C in the last 72 hours. CBG: No results for input(s): GLUCAP in the last 168 hours. Lipid Profile: No results for input(s): CHOL, HDL,  LDLCALC, TRIG, CHOLHDL, LDLDIRECT in the last 72 hours. Thyroid Function Tests: No results for input(s): TSH, T4TOTAL, FREET4, T3FREE, THYROIDAB in the last 72 hours. Anemia Panel: No results for input(s): VITAMINB12, FOLATE, FERRITIN, TIBC, IRON, RETICCTPCT in the last 72 hours. Urine analysis:    Component Value Date/Time   COLORURINE STRAW (A) 08/19/2020 1024   APPEARANCEUR CLEAR 08/19/2020 1024   LABSPEC 1.015 08/19/2020 1024   PHURINE 6.0 08/19/2020 1024   GLUCOSEU NEGATIVE 08/19/2020 1024   HGBUR LARGE (A) 08/19/2020 1024   BILIRUBINUR NEGATIVE 08/19/2020 1024   KETONESUR NEGATIVE 08/19/2020 1024   PROTEINUR NEGATIVE 08/19/2020 1024   UROBILINOGEN 0.2 05/05/2013 1110   NITRITE NEGATIVE 08/19/2020 1024   LEUKOCYTESUR SMALL (A) 08/19/2020 1024   Sepsis Labs: @LABRCNTIP (procalcitonin:4,lacticidven:4) ) Recent Results (from the past 240 hour(s))  Resp Panel by RT-PCR (Flu A&B, Covid) Nasopharyngeal Swab     Status: None   Collection Time: 02/13/21 11:24 PM   Specimen: Nasopharyngeal Swab; Nasopharyngeal(NP) swabs in vial transport medium  Result Value Ref Range Status   SARS Coronavirus 2 by RT PCR NEGATIVE NEGATIVE Final    Comment: (NOTE) SARS-CoV-2 target nucleic acids are NOT DETECTED.  The SARS-CoV-2 RNA is generally detectable in upper respiratory specimens during the acute phase of infection. The lowest concentration of SARS-CoV-2 viral copies this assay can detect is 138 copies/mL. A negative result does not preclude SARS-Cov-2 infection and should not be used as the sole basis for treatment or other patient management decisions. A negative result may occur with  improper specimen collection/handling, submission of specimen other than nasopharyngeal swab, presence of viral mutation(s) within the areas targeted by this assay, and inadequate number of viral copies(<138 copies/mL). A negative result must be combined with clinical observations, patient history, and  epidemiological information. The expected result is Negative.  Fact Sheet for Patients:  EntrepreneurPulse.com.au  Fact Sheet for Healthcare Providers:  IncredibleEmployment.be  This test is no t yet approved or cleared by the [place] FDA and  has been authorized for detection and/or diagnosis of SARS-CoV-2 by FDA under an Emergency Use Authorization (EUA). This EUA will remain  in effect (meaning this test can be used) for the duration of the COVID-19 declaration under  Section 564(b)(1) of the Act, 21 U.S.C.section 360bbb-3(b)(1), unless the authorization is terminated  or revoked sooner.       Influenza A by PCR NEGATIVE NEGATIVE Final   Influenza B by PCR NEGATIVE NEGATIVE Final    Comment: (NOTE) The Xpert Xpress SARS-CoV-2/FLU/RSV plus assay is intended as an aid in the diagnosis of influenza from Nasopharyngeal swab specimens and should not be used as a sole basis for treatment. Nasal washings and aspirates are unacceptable for Xpert Xpress SARS-CoV-2/FLU/RSV testing.  Fact Sheet for Patients: EntrepreneurPulse.com.au  Fact Sheet for Healthcare Providers: IncredibleEmployment.be  This test is not yet approved or cleared by the [place] FDA and has been authorized for detection and/or diagnosis of SARS-CoV-2 by FDA under an Emergency Use Authorization (EUA). This EUA will remain in effect (meaning this test can be used) for the duration of the COVID-19 declaration under Section 564(b)(1) of the Act, 21 U.S.C. section 360bbb-3(b)(1), unless the authorization is terminated or revoked.  Performed at [facility], [place] [address] [place],  [address]      Radiological Exams on Admission: CT HEAD CODE STROKE WO CONTRAST  Result Date: 02/13/2021 CLINICAL DATA:  Code stroke. Left facial droop with confusion and slurred speech EXAM: CT HEAD WITHOUT CONTRAST TECHNIQUE:  Contiguous axial images were obtained from the base of the skull through the vertex without intravenous contrast. COMPARISON:  08/19/2020 FINDINGS: Brain: There is no mass, hemorrhage or extra-axial collection. There is encephalomalacia of the right frontal operculum and anterior right temporal lobe. Ex vacuo dilatation of the right lateral ventricle. Vascular: No abnormal hyperdensity of the major intracranial arteries or dural venous sinuses. No intracranial atherosclerosis. Skull: Remote right-sided craniotomy. Sinuses/Orbits: No fluid levels or advanced mucosal thickening of the visualized paranasal sinuses. No mastoid or middle ear effusion. The orbits are normal. ASPECTS [facility] Stroke Program Early CT Score) - Ganglionic level infarction (caudate, lentiform nuclei, internal capsule, insula, M1-M3 cortex): 7 - Supraganglionic infarction (M4-M6 cortex): 3 Total score (0-10 with 10 being normal): 10 IMPRESSION: 1. No acute intracranial abnormality. 2. ASPECTS is 10. 3. Encephalomalacia of the right frontal operculum and anterior right temporal lobe. These results were communicated to Dr. Donnetta Simpers at 9:44 pm on 02/13/2021 by text page via the [facility] messaging system. Electronically Signed   By: Ulyses Jarred M.D.   On: 02/13/2021 21:44   CT ANGIO HEAD NECK W WO CM (CODE STROKE)  Result Date: 02/13/2021 CLINICAL DATA:  Left-sided weakness and slurred speech EXAM: CT ANGIOGRAPHY HEAD AND NECK TECHNIQUE: Multidetector CT imaging of the head and neck was performed using the standard protocol during bolus administration of intravenous contrast. Multiplanar CT image reconstructions and MIPs were obtained to evaluate the vascular anatomy. Carotid stenosis measurements (when applicable) are obtained utilizing NASCET criteria, using the distal internal carotid diameter as the denominator. CONTRAST:  61mL OMNIPAQUE IOHEXOL 350 MG/ML SOLN COMPARISON:  None. FINDINGS: CTA NECK FINDINGS SKELETON: There is no  bony spinal canal stenosis. No lytic or blastic lesion. OTHER NECK: Normal pharynx, larynx and major salivary glands. No cervical lymphadenopathy. Unremarkable thyroid gland. UPPER CHEST: No pneumothorax or pleural effusion. No nodules or masses. AORTIC ARCH: There is no calcific atherosclerosis of the aortic arch. There is no aneurysm, dissection or hemodynamically significant stenosis of the visualized portion of the aorta. Conventional 3 vessel aortic branching pattern. The visualized proximal subclavian arteries are widely patent. RIGHT CAROTID SYSTEM: Normal without aneurysm, dissection or stenosis. LEFT CAROTID SYSTEM: Normal without aneurysm, dissection or  stenosis. VERTEBRAL ARTERIES: Left dominant configuration. Both origins are clearly patent. There is no dissection, occlusion or flow-limiting stenosis to the skull base (V1-V3 segments). CTA HEAD FINDINGS POSTERIOR CIRCULATION: --Vertebral arteries: Normal V4 segments. --Inferior cerebellar arteries: Normal. --Basilar artery: Normal. --Superior cerebellar arteries: Normal. --Posterior cerebral arteries (PCA): Severe right P2 segment stenosis. Left PCA is normal. ANTERIOR CIRCULATION: --Intracranial internal carotid arteries: Normal. --Anterior cerebral arteries (ACA): Normal. Both A1 segments are present. Patent anterior communicating artery (a-comm). --Middle cerebral arteries (MCA): Normal. VENOUS SINUSES: As permitted by contrast timing, patent. ANATOMIC VARIANTS: None Review of the MIP images confirms the above findings. IMPRESSION: 1. No emergent large vessel occlusion. 2. Severe right PCA P2 segment stenosis. Aortic Atherosclerosis (ICD10-I70.0). Electronically Signed   By: Ulyses Jarred M.D.   On: 02/13/2021 22:48   CT US GUIDE VASC ACCESS LT NO REPORT  Result Date: 02/13/2021 There is no Radiologist interpretation  for this exam.   EKG: Independently reviewed.  Normal sinus rhythm.  Assessment/Plan Principal Problem:   Seizure  ([place]) Active Problems:   Hypothyroid   Status post above-knee amputation ([facility]CC)   History of anticoagulant therapy   History of deep venous thrombosis    Left-sided weakness with lethargy and dysarthria -appreciate neurology consult as per neurology patient symptoms may be recrudescence of her stroke symptoms in the setting of possible infectious or metabolic reasons.  Neurology at this time has recommended getting infectious work-up including getting chest x-ray UA procalcitonin and also getting MRI brain and EEG.  Follow metabolic panel closely gently hydrate. History of seizures neurology is recommending continuing her home dose of Vimpat lacosamide Keppra, Onfi.  Klonopin as needed and Ativan as needed for seizures.  Follow MRI brain and EEG. History of DVT and lupus anticoagulant on Coumadin.  Coumadin to be dosed per pharmacy.  If patient can not reliably take her medication orally due to lethargy then we will need to either try it through NG tube or Coumadin will be bridged with Lovenox. Hypothyroidism on Synthroid. Prior history of right lower extremity osteosarcoma status post amputation.   DVT prophylaxis: Coumadin.  If patient cannot take it orally then we will have to start Lovenox. Code Status: Full code. Family Communication: Will need to discuss with family. Disposition Plan: Home when stable. Consults called: Neurology. Admission status: Observation.   Rise Patience MD [facility] Pager [phone].  If 7PM-7AM, please contact night-coverage [email or web address] Password [email or web address]  02/14/2021, 1:51 AM

## 2021-02-14 NOTE — Evaluation (Signed)
Physical Therapy Evaluation Patient Details Name: Cassandra Andrade MRN: [id number] DOB: 11/07/1965 Today's Date: 02/14/2021  History of Present Illness  This 55 y.o. female admitted 02/13/21 with increasing somnolence over previous 2 weeks and worsensing of Lt sided weakness.  CT of head negative for acute abnormality, CTA negative for LVO.  MRI pending.  EEG performed.  Neurology feels weakness is recrudescence of prior stroke in setting of possible infectious or metabolic reasons.  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 PTA, she was living with her mother in a 2-level house with 4 STE and her bedroom on the 2nd floor. Pt rarely goes to the first floor. Pt was mod I scooting (possibly army crawling) around the house and relying heavily on her upper extremities to pull herself up or lower herself down between surfaces. Pt reports she does practice ambulating regularly with her prosthesis and RW though. Currently, pt displays L sided facial and UE weakness and L leg weakness, more so functionally than with MMT. In addition, she demonstrates deficits in cognition, balance, and activity tolerance. She is at high risk for falls, requiring modAx2 to transfer to stand, minA to stand statically, and min guard for bed mobility. Pt appears to have the level of care needed at home. Recommending pt d/c home with HHPT follow-up. Will continue to follow acutely.      Recommendations for follow up therapy are one component of a multi-disciplinary discharge planning process, led by the attending physician.  Recommendations may be updated based on patient status, additional functional criteria and insurance authorization.  Follow Up Recommendations Home health PT    Assistance Recommended at Discharge Frequent or  constant Supervision/Assistance  Functional Status Assessment Patient has had a recent decline in their functional status and demonstrates the ability to make significant improvements in function in a reasonable and predictable amount of time.  Equipment Recommendations  None recommended by PT    Recommendations for Other Services       Precautions / Restrictions Precautions Precautions: Fall;Other (comment) Precaution Comments: seziures; prior R AKA Restrictions Weight Bearing Restrictions: No      Mobility  Bed Mobility Overal bed mobility: Needs Assistance Bed Mobility: Supine to Sit;Sit to Supine     Supine to sit: Min guard;HOB elevated Sit to supine: Min guard;HOB elevated   General bed mobility comments: Extra time, but pt able to perform all bed mobility aspects with min guard for safety using bed rail and HOB elevated.    Transfers Overall transfer level: Needs assistance Equipment used: Rolling walker (2 wheels) Transfers: Sit to/from Stand Sit to Stand: Mod assist;+2 physical assistance;+2 safety/equipment;From elevated surface           General transfer comment: Pt initially required min A +2 to move into standing and min A to maintain static standing, however, she fatigued.  After seated rest break, she tried again, but required mod A+2 for second sit to stand from stretcher    Ambulation/Gait             Pre-gait activities: Attempted to hop in place in front of stretcher, but pt unable to push off UEs and clear foot even with mod-maxAx2.    Stairs            Wheelchair Mobility    Modified Rankin (Stroke  Patients Only) Modified Rankin (Stroke Patients Only) Pre-Morbid Rankin Score: Moderately severe disability Modified Rankin: Severe disability     Balance Overall balance assessment: Needs assistance Sitting-balance support: Feet supported Sitting balance-Leahy Scale: Good     Standing balance support: Reliant on assistive device  for balance;Bilateral upper extremity supported Standing balance-Leahy Scale: Poor Standing balance comment: UE support and min A for static standing                             Pertinent Vitals/Pain Pain Assessment: No/denies pain    Home Living Family/patient expects to be discharged to:: Private residence Living Arrangements: Parent (mother) Available Help at Discharge: Family;Available 24 hours/day Type of Home: House Home Access: Stairs to enter   [employer] of Steps: 4 (scoots up/down stairs) Alternate Level Stairs-Number of Steps: flight (scoots up/down stairs) Home Layout: Two level;Bed/bath upstairs (stays upstairs majority of time) Home Equipment: Rolling Walker (2 wheels);Shower seat - built in;Wheelchair - manual;Hospital bed      Prior Function Prior Level of Function : Needs assist       Physical Assist : Mobility (physical);ADLs (physical)     Mobility Comments: Primarily scoots to mobilize around house and on stairs. Intermittently walks when practicing with prosthesis, walks every 1-2 days. Uses w/c when outside the house. ADLs Comments: Pt pulls herself up to her toilet relying heavily on her UEs. Her mother sits outside of the shower when bathing for safety.     Hand Dominance   Dominant Hand: Right    Extremity/Trunk Assessment   Upper Extremity Assessment Upper Extremity Assessment: Defer to OT evaluation RUE Deficits / Details: Rt UE tremulous.  Pt and son indicate she has tremors at baseline, but indicate that the tremor may be a bit worse, but they were no clear on this RUE Coordination: decreased fine motor LUE Deficits / Details: Pt with AROM shoulder, elbow WFL, impaired ability to opose and impaired [facility] Lt hand which she reports is her baseline LUE Coordination: decreased gross motor;decreased fine motor    Lower Extremity Assessment Lower Extremity Assessment: RLE deficits/detail;LLE deficits/detail RLE Deficits /  Details: prior R AKA, denies numbness/tingling, MMT of 4+ hip flexion RLE Sensation: WNL RLE Coordination: decreased gross motor LLE Deficits / Details: MMT scores of 4+ hip flexion, 5 knee extension, 4 knee flexion, 4+ ankle dorsiflexion; weakness noted more functionally with difficulty extending and maintaining knee extension in stand; denies numbness/tingling LLE Sensation: WNL LLE Coordination: decreased gross motor    Cervical / Trunk Assessment Cervical / Trunk Assessment: Normal  Communication   Communication: No difficulties  Cognition Arousal/Alertness: Awake/alert Behavior During Therapy: WFL for tasks assessed/performed Overall Cognitive Status: History of cognitive impairments - at baseline                                 General Comments: Son present initially.  Pt provided intermittently incorrect info re: PLOF.   Pt is very tangential and frequently self distracts        General Comments General comments (skin integrity, edema, etc.): VSS on RA    Exercises     Assessment/Plan    PT Assessment Patient needs continued PT services  PT Problem List Decreased strength;Decreased activity tolerance;Decreased balance;Decreased mobility;Decreased coordination;Decreased cognition       PT Treatment Interventions DME instruction;Gait training;Stair training;Functional mobility training;Therapeutic activities;Therapeutic exercise;Balance training;Neuromuscular re-education;Cognitive remediation;Patient/family education;Wheelchair mobility  training    PT Goals (Current goals can be found in the Care Plan section)  Acute Rehab PT Goals Patient Stated Goal: to get better and walk PT Goal Formulation: With patient/family Time For Goal Achievement: 02/28/21 Potential to Achieve Goals: Good    Frequency Min 4X/week   Barriers to discharge        Co-evaluation PT/OT/SLP Co-Evaluation/Treatment: Yes Reason for Co-Treatment: For patient/therapist safety;To  address functional/ADL transfers PT goals addressed during session: Mobility/safety with mobility;Balance;Proper use of DME OT goals addressed during session: ADL's and self-care       AM-PAC PT "6 Clicks" Mobility  Outcome Measure Help needed turning from your back to your side while in a flat bed without using bedrails?: A Little Help needed moving from lying on your back to sitting on the side of a flat bed without using bedrails?: A Little Help needed moving to and from a bed to a chair (including a wheelchair)?: Total Help needed standing up from a chair using your arms (e.g., wheelchair or bedside chair)?: Total Help needed to walk in hospital room?: Total Help needed climbing 3-5 steps with a railing? : Total 6 Click Score: 10    End of Session Equipment Utilized During Treatment: Gait belt Activity Tolerance: Patient tolerated treatment well Patient left: in bed;with call bell/phone within reach   PT Visit Diagnosis: Unsteadiness on feet (R26.81);Muscle weakness (generalized) (M62.81);History of falling (Z91.81);Difficulty in walking, not elsewhere classified (R26.2);Other symptoms and signs involving the nervous system (R29.898)    Time: 0355-9741 PT Time Calculation (min) (ACUTE ONLY): 32 min   Charges:   PT Evaluation $PT Eval Moderate Complexity: 1 Mod          Moishe Spice, PT, DPT Acute Rehabilitation Services  Pager: [phone] Office: [phone]   Orvan Falconer 02/14/2021, 12:48 PM

## 2021-02-14 NOTE — Progress Notes (Signed)
EEG completed, results pending. 

## 2021-02-14 NOTE — Plan of Care (Signed)
°  Problem: Activity: Goal: Risk for activity intolerance will decrease Outcome: Progressing   Problem: Nutrition: Goal: Adequate nutrition will be maintained Outcome: Progressing   Problem: Pain Managment: Goal: General experience of comfort will improve Outcome: Progressing   Problem: Safety: Goal: Ability to remain free from injury will improve Outcome: Progressing   Problem: Coping: Goal: Ability to adjust to condition or change in health will improve Outcome: Progressing

## 2021-02-14 NOTE — Progress Notes (Signed)
ANTICOAGULATION CONSULT NOTE - Follow Up Consult  Pharmacy Consult for Warfarin Indication: H/o VTE  Allergies  Allergen Reactions   Vicodin [Hydrocodone-Acetaminophen] Nausea And Vomiting   Morphine And Related Rash    Allergic reaction only in iv form    Patient Measurements: Height: 5\' 6"  (167.6 cm) IBW/kg (Calculated) : 59.3  Vital Signs: Temp: 97.5 F (36.4 C) (12/23 2302) Temp Source: Oral (12/23 2302) BP: 115/94 (12/24 0730) Pulse Rate: 73 (12/24 0730)  Labs: Recent Labs    02/13/21 2212 02/13/21 2227 02/14/21 0240  HGB 11.8* 13.3 11.7*  HCT 37.1 39.0 36.9  PLT 168  --  195  APTT 38*  --   --   LABPROT 20.6*  --  20.1*  INR 1.8*  --  1.7*  CREATININE 1.17* 1.00 0.99    CrCl cannot be calculated (Unknown ideal weight.).   Medications: see MAR  Assessment: 55 yo F presented as code stroke - neuro suspects recrudescence of prior stroke symptoms in the setting of a potential infectious or metabolic abnormality. Plan to continue warfarin for h/o VTE. Of note, warfarin held per MD 12/21 and 12/22 and was due to restart 12/23 but presented to hospital before taking.   PTA regimen: 7.5 mg Su/Wed, 10 mg all other days  INR remains subtherapeutic at 1.7. CBC stable.  Goal of Therapy:  INR 2-3 Monitor platelets by anticoagulation protocol: Yes   Plan:  Warfarin 10 mg x1 (home regimen) Start Lovenox bridge (1 mg/kg q12h) until INR >2 Daily INR, weekly CBC  Laurey Arrow, PharmD PGY1 Pharmacy Resident 02/14/2021  8:36 AM  Please check [email or web address] for unit-specific pharmacy phone numbers.

## 2021-02-14 NOTE — ED Notes (Signed)
ED TO INPATIENT HANDOFF REPORT  ED Nurse Name and Phone #: Celes Dedic, 60  S Name/Age/Gender Cassandra Andrade 55 y.o. female Room/Bed: 001C/001C  Code Status   Code Status: Full Code  Home/SNF/Other Home Patient oriented to: self, place, time, and situation Is this baseline? Yes   Triage Complete: Triage complete  Chief Complaint Seizure [facility]) [R56.9]  Triage Note Per EMS was told by family that pt was having left sided droop, and weakness with slurred speech around 1000 this morning. Pt arrived 2126, code stroke called at 2101. Hx of seizures with right AKA.     Allergies Allergies  Allergen Reactions   Vicodin [Hydrocodone-Acetaminophen] Nausea And Vomiting   Morphine And Related Rash    Allergic reaction only in iv form    Level of Care/Admitting Diagnosis ED Disposition     ED Disposition  Admit   Condition  --   [place]: [place] [[id number]]  Level of Care: Progressive [102]  Admit to Progressive based on following criteria: MULTISYSTEM THREATS such as stable sepsis, metabolic/electrolyte imbalance with or without encephalopathy that is responding to early treatment.  May place patient in observation at [facility] or [place] if equivalent level of care is available:: No  Covid Evaluation: Asymptomatic Screening Protocol (No Symptoms)  Diagnosis: Seizure [facility]) [[id number]]  Admitting Physician: Rise Patience [phone]  Attending Physician: Rise Patience [phone]          B Medical/Surgery History Past Medical History:  Diagnosis Date   Anxiety    Arthritis    Cancer ([place])    Osteosarcoma, right leg   CVA (cerebrovascular accident due to intracerebral hemorrhage) ([place]) 2004   Depression    DVT (deep venous thrombosis) ([facility])    Left leg   MRSA (methicillin resistant Staphylococcus aureus)    S/P BKA (below knee amputation) unilateral ([place])    Right    Seizures ([place])    Thyroid disease    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 femur, folllowed by MRSA   TOTAL KNEE ARTHROPLASTY Right      A IV Location/Drains/Wounds Patient Lines/Drains/Airways Status     Active Line/Drains/Airways     Name Placement date Placement time Site Days   Peripheral IV 02/13/21 20 G Other (Comment) Left Antecubital 02/13/21  2213  Antecubital  1   Peripheral IV 02/13/21 22 G Anterior;Distal;Right;Upper Arm 02/13/21  2140  Arm  1   External Urinary Catheter 02/14/21  0114  --  less than 1            Intake/Output Last 24 hours No intake or output data in the 24 hours ending 02/14/21 1042  Labs/Imaging Results for orders placed or performed during the hospital encounter of 02/13/21 (from the past 48 hour(s))  Protime-INR     Status: Abnormal   Collection Time: 02/13/21 10:12 PM  Result Value Ref Range   Prothrombin Time 20.6 (H) 11.4 - 15.2 seconds   INR 1.8 (H) 0.8 - 1.2    Comment: (NOTE) INR goal varies based on device and disease states. Performed at [facility], [place] [address], [place], [place] [address]   APTT     Status: Abnormal   Collection Time: 02/13/21 10:12 PM  Result  Value Ref Range   aPTT 38 (H) 24 - 36 seconds    Comment:        IF BASELINE aPTT IS ELEVATED, SUGGEST PATIENT RISK ASSESSMENT BE USED TO DETERMINE APPROPRIATE ANTICOAGULANT THERAPY. Performed at [facility], [place] [address], [place], [place] [address]   CBC     Status: Abnormal   Collection Time: 02/13/21 10:12 PM  Result Value Ref Range   WBC 5.0 4.0 - 10.5 K/uL   RBC 4.08 3.87 - 5.11 MIL/uL   Hemoglobin 11.8 (L) 12.0 - 15.0 g/dL   HCT 37.1 36.0 - 46.0 %   MCV 90.9 80.0 - 100.0 fL   MCH 28.9 26.0 - 34.0 pg   MCHC 31.8 30.0 - 36.0 g/dL   RDW 13.0 11.5 - 15.5 %   Platelets 168 150 - 400 K/uL   nRBC 0.0 0.0  - 0.2 %    Comment: Performed at [facility], [place] [address], [place], [place] [address]  Differential     Status: None   Collection Time: 02/13/21 10:12 PM  Result Value Ref Range   Neutrophils Relative % 45 %   Neutro Abs 2.3 1.7 - 7.7 K/uL   Lymphocytes Relative 44 %   Lymphs Abs 2.2 0.7 - 4.0 K/uL   Monocytes Relative 8 %   Monocytes Absolute 0.4 0.1 - 1.0 K/uL   Eosinophils Relative 2 %   Eosinophils Absolute 0.1 0.0 - 0.5 K/uL   Basophils Relative 1 %   Basophils Absolute 0.0 0.0 - 0.1 K/uL   Immature Granulocytes 0 %   Abs Immature Granulocytes 0.01 0.00 - 0.07 K/uL    Comment: Performed at [facility] [address], [place], [place] [address]  Comprehensive metabolic panel     Status: Abnormal   Collection Time: 02/13/21 10:12 PM  Result Value Ref Range   Sodium 139 135 - 145 mmol/L   Potassium 3.6 3.5 - 5.1 mmol/L   Chloride 102 98 - 111 mmol/L   CO2 30 22 - 32 mmol/L   Glucose, Bld 97 70 - 99 mg/dL    Comment: Glucose reference range applies only to samples taken after fasting for at least 8 hours.   BUN 13 6 - 20 mg/dL   Creatinine, Ser 1.17 (H) 0.44 - 1.00 mg/dL   Calcium 9.5 8.9 - 10.3 mg/dL   Total Protein 7.0 6.5 - 8.1 g/dL   Albumin 3.8 3.5 - 5.0 g/dL   AST 27 15 - 41 U/L   ALT 27 0 - 44 U/L   Alkaline Phosphatase 116 38 - 126 U/L   Total Bilirubin 0.5 0.3 - 1.2 mg/dL   GFR, Estimated 55 (L) >60 mL/min    Comment: (NOTE) Calculated using the CKD-EPI Creatinine Equation (2021)    Anion gap 7 5 - 15    Comment: Performed at [facility] [address], [place], [place] [address]  I-Stat beta hCG blood, ED     Status: None   Collection Time: 02/13/21 10:25 PM  Result Value Ref Range   I-stat hCG, quantitative <5.0 <5 mIU/mL   Comment 3            Comment:   GEST. AGE      CONC.  (mIU/mL)   <=1 WEEK        5 - 50     2 WEEKS       50 - 500     3  WEEKS       100 - 10,000     4 WEEKS     1,000 - 30,000        FEMALE AND NON-PREGNANT  FEMALE:     LESS THAN 5 mIU/mL   I-stat chem 8, ED     Status: None   Collection Time: 02/13/21 10:27 PM  Result Value Ref Range   Sodium 141 135 - 145 mmol/L   Potassium 3.6 3.5 - 5.1 mmol/L   Chloride 102 98 - 111 mmol/L   BUN 15 6 - 20 mg/dL   Creatinine, Ser 1.00 0.44 - 1.00 mg/dL   Glucose, Bld 92 70 - 99 mg/dL    Comment: Glucose reference range applies only to samples taken after fasting for at least 8 hours.   Calcium, Ion 1.18 1.15 - 1.40 mmol/L   TCO2 29 22 - 32 mmol/L   Hemoglobin 13.3 12.0 - 15.0 g/dL   HCT 39.0 36.0 - 46.0 %  Resp Panel by RT-PCR (Flu A&B, Covid) Nasopharyngeal Swab     Status: None   Collection Time: 02/13/21 11:24 PM   Specimen: Nasopharyngeal Swab; Nasopharyngeal(NP) swabs in vial transport medium  Result Value Ref Range   SARS Coronavirus 2 by RT PCR NEGATIVE NEGATIVE    Comment: (NOTE) SARS-CoV-2 target nucleic acids are NOT DETECTED.  The SARS-CoV-2 RNA is generally detectable in upper respiratory specimens during the acute phase of infection. The lowest concentration of SARS-CoV-2 viral copies this assay can detect is 138 copies/mL. A negative result does not preclude SARS-Cov-2 infection and should not be used as the sole basis for treatment or other patient management decisions. A negative result may occur with  improper specimen collection/handling, submission of specimen other than nasopharyngeal swab, presence of viral mutation(s) within the areas targeted by this assay, and inadequate number of viral copies(<138 copies/mL). A negative result must be combined with clinical observations, patient history, and epidemiological information. The expected result is Negative.  Fact Sheet for Patients:  EntrepreneurPulse.com.au  Fact Sheet for Healthcare Providers:  IncredibleEmployment.be  This test is no t yet approved or cleared by the [place] FDA and  has been authorized for detection and/or  diagnosis of SARS-CoV-2 by FDA under an Emergency Use Authorization (EUA). This EUA will remain  in effect (meaning this test can be used) for the duration of the COVID-19 declaration under Section 564(b)(1) of the Act, 21 U.S.C.section 360bbb-3(b)(1), unless the authorization is terminated  or revoked sooner.       Influenza A by PCR NEGATIVE NEGATIVE   Influenza B by PCR NEGATIVE NEGATIVE    Comment: (NOTE) The Xpert Xpress SARS-CoV-2/FLU/RSV plus assay is intended as an aid in the diagnosis of influenza from Nasopharyngeal swab specimens and should not be used as a sole basis for treatment. Nasal washings and aspirates are unacceptable for Xpert Xpress SARS-CoV-2/FLU/RSV testing.  Fact Sheet for Patients: EntrepreneurPulse.com.au  Fact Sheet for Healthcare Providers: IncredibleEmployment.be  This test is not yet approved or cleared by the [place] FDA and has been authorized for detection and/or diagnosis of SARS-CoV-2 by FDA under an Emergency Use Authorization (EUA). This EUA will remain in effect (meaning this test can be used) for the duration of the COVID-19 declaration under Section 564(b)(1) of the Act, 21 U.S.C. section 360bbb-3(b)(1), unless the authorization is terminated or revoked.  Performed at [facility], [place] [address] [place], [place] [address]   Urinalysis, Routine w reflex microscopic  Status: Abnormal   Collection Time: 02/14/21  2:11 AM  Result Value Ref Range   Color, Urine YELLOW YELLOW   APPearance CLEAR CLEAR   Specific Gravity, Urine 1.010 1.005 - 1.030   pH 7.5 5.0 - 8.0   Glucose, UA NEGATIVE NEGATIVE mg/dL   Hgb urine dipstick LARGE (A) NEGATIVE   Bilirubin Urine NEGATIVE NEGATIVE   Ketones, ur NEGATIVE NEGATIVE mg/dL   Protein, ur NEGATIVE NEGATIVE mg/dL   Nitrite NEGATIVE NEGATIVE   Leukocytes,Ua NEGATIVE NEGATIVE    Comment: Performed at [facility] [address]  [place], [place] [address]  Urinalysis, Microscopic (reflex)     Status: Abnormal   Collection Time: 02/14/21  2:11 AM  Result Value Ref Range   RBC / HPF 21-50 0 - 5 RBC/hpf   WBC, UA 0-5 0 - 5 WBC/hpf   Bacteria, UA RARE (A) NONE SEEN   Squamous Epithelial / LPF 0-5 0 - 5    Comment: Performed at [facility], [place] [address] [place], [place] [address]  Basic metabolic panel     Status: Abnormal   Collection Time: 02/14/21  2:40 AM  Result Value Ref Range   Sodium 139 135 - 145 mmol/L   Potassium 3.4 (L) 3.5 - 5.1 mmol/L   Chloride 104 98 - 111 mmol/L   CO2 27 22 - 32 mmol/L   Glucose, Bld 105 (H) 70 - 99 mg/dL    Comment: Glucose reference range applies only to samples taken after fasting for at least 8 hours.   BUN 11 6 - 20 mg/dL   Creatinine, Ser 0.99 0.44 - 1.00 mg/dL   Calcium 9.0 8.9 - 10.3 mg/dL   GFR, Estimated >60 >60 mL/min    Comment: (NOTE) Calculated using the CKD-EPI Creatinine Equation (2021)    Anion gap 8 5 - 15    Comment: Performed at [facility] [address], [place], [place] [address]  CBC     Status: Abnormal   Collection Time: 02/14/21  2:40 AM  Result Value Ref Range   WBC 4.4 4.0 - 10.5 K/uL   RBC 4.05 3.87 - 5.11 MIL/uL   Hemoglobin 11.7 (L) 12.0 - 15.0 g/dL   HCT 36.9 36.0 - 46.0 %   MCV 91.1 80.0 - 100.0 fL   MCH 28.9 26.0 - 34.0 pg   MCHC 31.7 30.0 - 36.0 g/dL   RDW 12.9 11.5 - 15.5 %   Platelets 195 150 - 400 K/uL   nRBC 0.0 0.0 - 0.2 %    Comment: Performed at [facility], [place] [address], [place], [place] [address]  Magnesium     Status: None   Collection Time: 02/14/21  2:40 AM  Result Value Ref Range   Magnesium 1.7 1.7 - 2.4 mg/dL    Comment: Performed at [facility] [address] [place], [place] [address]  Protime-INR     Status: Abnormal   Collection Time: 02/14/21  2:40 AM  Result Value Ref Range   Prothrombin Time 20.1 (H) 11.4 - 15.2 seconds   INR 1.7 (H) 0.8 - 1.2    Comment: (NOTE) INR goal  varies based on device and disease states. Performed at [facility], [place] [address] [place], [place] [address]   Procalcitonin - Baseline     Status: None   Collection Time: 02/14/21  2:40 AM  Result Value Ref Range   Procalcitonin <0.10 ng/mL  Comment:        Interpretation: PCT (Procalcitonin) <= 0.5 ng/mL: Systemic infection (sepsis) is not likely. Local bacterial infection is possible. (NOTE)       Sepsis PCT Algorithm           Lower Respiratory Tract                                      Infection PCT Algorithm    ----------------------------     ----------------------------         PCT < 0.25 ng/mL                PCT < 0.10 ng/mL          Strongly encourage             Strongly discourage   discontinuation of antibiotics    initiation of antibiotics    ----------------------------     -----------------------------       PCT 0.25 - 0.50 ng/mL            PCT 0.10 - 0.25 ng/mL               OR       >80% decrease in PCT            Discourage initiation of                                            antibiotics      Encourage discontinuation           of antibiotics    ----------------------------     -----------------------------         PCT >= 0.50 ng/mL              PCT 0.26 - 0.50 ng/mL               AND        <80% decrease in PCT             Encourage initiation of                                             antibiotics       Encourage continuation           of antibiotics    ----------------------------     -----------------------------        PCT >= 0.50 ng/mL                  PCT > 0.50 ng/mL               AND         increase in PCT                  Strongly encourage                                      initiation of antibiotics    Strongly encourage escalation           of antibiotics                                     -----------------------------  PCT <= 0.25 ng/mL                                                  OR                                        > 80% decrease in PCT                                      Discontinue / Do not initiate                                             antibiotics  Performed at [facility], [place] [address] [place], [place] [address]   CBG monitoring, ED     Status: None   Collection Time: 02/14/21  8:15 AM  Result Value Ref Range   Glucose-Capillary 88 70 - 99 mg/dL    Comment: Glucose reference range applies only to samples taken after fasting for at least 8 hours.   DG CHEST PORT 1 VIEW  Result Date: 02/14/2021 CLINICAL DATA:  Seizure. EXAM: PORTABLE CHEST 1 VIEW COMPARISON:  02/07/2013. FINDINGS: The heart size and mediastinal contours are within normal limits. The left lung apex is excluded from the field of view. No consolidation, effusion, or pneumothorax is seen. No acute osseous abnormality. A neurostimulator device is present over the left chest with leads coursing superiorly. IMPRESSION: No acute cardiopulmonary process. Electronically Signed   By: Brett Fairy M.D.   On: 02/14/2021 03:10   EEG adult  Result Date: 02/14/2021 Derek Jack, MD     02/14/2021 10:20 AM Routine EEG Report Keali Seyer is a 55 y.o. female with a history of seizure who is undergoing an EEG to evaluate for seizures. Report: This EEG was acquired with electrodes placed according to the International 10-20 electrode system (including Fp1, Fp2, F3, F4, C3, C4, P3, P4, O1, O2, T3, T4, T5, T6, A1, A2, Fz, Cz, Pz). The following electrodes were missing or displaced: none. The occipital dominant rhythm was 9 Hz. This activity is reactive to stimulation. Drowsiness was manifested by background fragmentation; deeper stages of sleep were identified by K complexes and sleep spindles. There was focal slowing over the right frontotemporal region. There were no interictal epileptiform discharges. There were no electrographic seizures identified. There was no abnormal response to  photic stimulation. Hyperventilation was not performed. Impression and clinical correlation: This EEG was obtained while awake and asleep and is abnormal due to focal slowing over the right frontotemporal region, indicating focal cerebral dysfunction in that area.   Su Monks, MD [facility] [phone] If 7pm- 7am, please page neurology on call as listed in [place].   CT HEAD CODE STROKE WO CONTRAST  Result Date: 02/13/2021 CLINICAL DATA:  Code stroke. Left facial droop with confusion and slurred speech EXAM: CT HEAD WITHOUT CONTRAST TECHNIQUE: Contiguous axial images were obtained from the base of the skull through the vertex without intravenous contrast. COMPARISON:  08/19/2020 FINDINGS: Brain: There is no mass, hemorrhage or extra-axial  collection. There is encephalomalacia of the right frontal operculum and anterior right temporal lobe. Ex vacuo dilatation of the right lateral ventricle. Vascular: No abnormal hyperdensity of the major intracranial arteries or dural venous sinuses. No intracranial atherosclerosis. Skull: Remote right-sided craniotomy. Sinuses/Orbits: No fluid levels or advanced mucosal thickening of the visualized paranasal sinuses. No mastoid or middle ear effusion. The orbits are normal. ASPECTS [facility] Stroke Program Early CT Score) - Ganglionic level infarction (caudate, lentiform nuclei, internal capsule, insula, M1-M3 cortex): 7 - Supraganglionic infarction (M4-M6 cortex): 3 Total score (0-10 with 10 being normal): 10 IMPRESSION: 1. No acute intracranial abnormality. 2. ASPECTS is 10. 3. Encephalomalacia of the right frontal operculum and anterior right temporal lobe. These results were communicated to Dr. Donnetta Simpers at 9:44 pm on 02/13/2021 by text page via the [facility] messaging system. Electronically Signed   By: Ulyses Jarred M.D.   On: 02/13/2021 21:44   CT ANGIO HEAD NECK W WO CM (CODE STROKE)  Result Date: 02/13/2021 CLINICAL DATA:  Left-sided weakness  and slurred speech EXAM: CT ANGIOGRAPHY HEAD AND NECK TECHNIQUE: Multidetector CT imaging of the head and neck was performed using the standard protocol during bolus administration of intravenous contrast. Multiplanar CT image reconstructions and MIPs were obtained to evaluate the vascular anatomy. Carotid stenosis measurements (when applicable) are obtained utilizing NASCET criteria, using the distal internal carotid diameter as the denominator. CONTRAST:  100mL OMNIPAQUE IOHEXOL 350 MG/ML SOLN COMPARISON:  None. FINDINGS: CTA NECK FINDINGS SKELETON: There is no bony spinal canal stenosis. No lytic or blastic lesion. OTHER NECK: Normal pharynx, larynx and major salivary glands. No cervical lymphadenopathy. Unremarkable thyroid gland. UPPER CHEST: No pneumothorax or pleural effusion. No nodules or masses. AORTIC ARCH: There is no calcific atherosclerosis of the aortic arch. There is no aneurysm, dissection or hemodynamically significant stenosis of the visualized portion of the aorta. Conventional 3 vessel aortic branching pattern. The visualized proximal subclavian arteries are widely patent. RIGHT CAROTID SYSTEM: Normal without aneurysm, dissection or stenosis. LEFT CAROTID SYSTEM: Normal without aneurysm, dissection or stenosis. VERTEBRAL ARTERIES: Left dominant configuration. Both origins are clearly patent. There is no dissection, occlusion or flow-limiting stenosis to the skull base (V1-V3 segments). CTA HEAD FINDINGS POSTERIOR CIRCULATION: --Vertebral arteries: Normal V4 segments. --Inferior cerebellar arteries: Normal. --Basilar artery: Normal. --Superior cerebellar arteries: Normal. --Posterior cerebral arteries (PCA): Severe right P2 segment stenosis. Left PCA is normal. ANTERIOR CIRCULATION: --Intracranial internal carotid arteries: Normal. --Anterior cerebral arteries (ACA): Normal. Both A1 segments are present. Patent anterior communicating artery (a-comm). --Middle cerebral arteries (MCA): Normal.  VENOUS SINUSES: As permitted by contrast timing, patent. ANATOMIC VARIANTS: None Review of the MIP images confirms the above findings. IMPRESSION: 1. No emergent large vessel occlusion. 2. Severe right PCA P2 segment stenosis. Aortic Atherosclerosis (ICD10-I70.0). Electronically Signed   By: Ulyses Jarred M.D.   On: 02/13/2021 22:48   CT US GUIDE VASC ACCESS LT NO REPORT  Result Date: 02/13/2021 There is no Radiologist interpretation  for this exam.   Pending Labs Unresulted Labs (From admission, onward)     Start     Ordered   02/15/21 0500  CBC with Differential/Platelet  Tomorrow morning,   R        02/14/21 0916   02/15/21 0500  Comprehensive metabolic panel  Tomorrow morning,   R        02/14/21 0916   02/15/21 0500  Magnesium  Tomorrow morning,   R        02/14/21 4193  02/14/21 0500  Protime-INR  Daily,   R      02/14/21 0210   02/14/21 0150  HIV Antibody (routine testing w rflx)  (HIV Antibody (Routine testing w reflex) panel)  Once,   R        02/14/21 0151            Vitals/Pain Today's Vitals   02/14/21 0730 02/14/21 0800 02/14/21 1015 02/14/21 1030  BP: (!) 115/94  (!) 119/92 (!) 115/99  Pulse: 73  83 85  Resp: 15  16 16   Temp:      TempSrc:      SpO2: 100%  100% 100%  Weight:  78.5 kg    Height:  5\' 6"  (1.676 m)    PainSc:        Isolation Precautions No active isolations  Medications Medications  cloBAZam (ONFI) tablet 5 mg (5 mg Oral Given 02/14/21 0007)  clonazepam (KLONOPIN) disintegrating tablet 1 mg (has no administration in time range)  lacosamide (VIMPAT) 200 mg in sodium chloride 0.9 % 25 mL IVPB (0 mg Intravenous Stopped 02/14/21 0045)    Or  lacosamide (VIMPAT) tablet 200 mg ( Oral See Alternative 02/14/21 0045)  lamoTRIgine (LAMICTAL) tablet 350 mg (350 mg Oral Given 02/14/21 0954)  levETIRAcetam (KEPPRA) 2,000 mg in sodium chloride 0.9 % 250 mL IVPB (0 mg Intravenous Stopped 02/14/21 0010)    Or  levETIRAcetam (KEPPRA) tablet 2,000 mg  ( Oral See Alternative 02/14/21 0010)  levETIRAcetam (KEPPRA) IVPB 1500 mg/ 100 mL premix (0 mg Intravenous Stopped 02/14/21 0741)    Or  levETIRAcetam (KEPPRA) tablet 1,500 mg ( Oral See Alternative 02/14/21 0741)  venlafaxine XR (EFFEXOR-XR) 24 hr capsule 150 mg (150 mg Oral Given 02/14/21 0954)  levothyroxine (SYNTHROID) tablet 50 mcg (50 mcg Oral Given 02/14/21 0557)  pantoprazole (PROTONIX) EC tablet 40 mg (40 mg Oral Given 02/14/21 0246)  acetaminophen (TYLENOL) tablet 650 mg (650 mg Oral Given 02/14/21 0558)    Or  acetaminophen (TYLENOL) suppository 650 mg ( Rectal See Alternative 02/14/21 0558)  Warfarin - Pharmacist Dosing Inpatient (has no administration in time range)  warfarin (COUMADIN) tablet 10 mg (has no administration in time range)  topiramate (TOPAMAX) tablet 25 mg (25 mg Oral Given 02/14/21 0954)  enoxaparin (LOVENOX) injection 80 mg (80 mg Subcutaneous Given 02/14/21 0958)  sodium chloride flush (NS) 0.9 % injection 3 mL (3 mLs Intravenous Given 02/13/21 2150)  iohexol (OMNIPAQUE) 350 MG/ML injection 75 mL (75 mLs Intravenous Contrast Given 02/13/21 2230)  warfarin (COUMADIN) tablet 7.5 mg (7.5 mg Oral Given 02/14/21 0247)    Mobility manual wheelchair High fall risk   Focused Assessments Neuro Assessment Handoff:  Swallow screen pass? Yes    NIH Stroke Scale ( + Modified Stroke Scale Criteria)  Interval: Other (Comment) Level of Consciousness (1a.)   : Alert, keenly responsive LOC Questions (1b. )   +: Answers both questions correctly LOC Commands (1c. )   + : Performs both tasks correctly Best Gaze (2. )  +: Normal Visual (3. )  +: No visual loss Facial Palsy (4. )    : Normal symmetrical movements Motor Arm, Left (5a. )   +: No drift Motor Arm, Right (5b. )   +: No drift Motor Leg, Left (6a. )   +: No drift Motor Leg, Right (6b. )   +: No drift Limb Ataxia (7. ): Absent Sensory (8. )   +: Normal, no sensory loss Best Language (9. )   +:  No  aphasia Dysarthria (10. ): Normal Extinction/Inattention (11.)   +: No Abnormality Modified SS Total  +: 0 Complete NIHSS TOTAL: 0 Last date known well: 02/13/21 Last time known well: 1000 Neuro Assessment:   Neuro Checks:   Initial (02/13/21 2130)  Last Documented NIHSS Modified Score: 0 (02/14/21 0752) Has TPA been given? No If patient is a Neuro Trauma and patient is going to OR before floor call report to [place] nurse: [phone] or [phone]   R Recommendations: See Admitting Provider Note  Report given to:   Additional Notes:

## 2021-02-14 NOTE — ED Notes (Signed)
EEG at bedside.

## 2021-02-14 NOTE — Progress Notes (Signed)
ANTICOAGULATION CONSULT NOTE - Initial Consult  Pharmacy Consult for Coumadin Indication:  h/o VTE  Allergies  Allergen Reactions   Vicodin [Hydrocodone-Acetaminophen] Nausea And Vomiting   Morphine And Related Rash    Allergic reaction only in iv form   Vital Signs: Temp: 97.5 F (36.4 C) (12/23 2302) Temp Source: Oral (12/23 2302) BP: 141/77 (12/23 2345) Pulse Rate: 78 (12/23 2345)  Labs: Recent Labs    02/13/21 2212 02/13/21 2227  HGB 11.8* 13.3  HCT 37.1 39.0  PLT 168  --   APTT 38*  --   LABPROT 20.6*  --   INR 1.8*  --   CREATININE 1.17* 1.00    Medical History: Past Medical History:  Diagnosis Date   Anxiety    Arthritis    Cancer ([place])    Osteosarcoma, right leg   CVA (cerebrovascular accident due to intracerebral hemorrhage) ([place]) 2004   Depression    DVT (deep venous thrombosis) ([facility])    Left leg   MRSA (methicillin resistant Staphylococcus aureus)    S/P BKA (below knee amputation) unilateral ([facility])    Right    Seizures ([place])    Thyroid disease    Unspecified epilepsy with intractable epilepsy 02/28/2013     Assessment: 55yo female presents as code stroke, neuro suspects recrudescence of prior stroke symptoms in the setting of a potential infectious or metabolic abnormality, to continue Coumadin for h/o VTE; current INR below goal, had held Coumadin per MD 12/21 and 12/22, was supposed to resume 12/23 but presented to hospital prior to taking; admitting MD notes that pt can be transitioned to LMWH if cannot tolerate PO meds.  Goal of Therapy:  INR 2-3   Plan:  Coumadin 7.5mg  now and monitor INR for dose adjustments; switch to Lovenox if cannot tolerate PO.  Wynona Neat, PharmD, BCPS  02/14/2021,2:12 AM

## 2021-02-14 NOTE — ED Triage Notes (Signed)
Per EMS was told by family that pt was having left sided droop, and weakness with slurred speech around 1000 this morning. Pt arrived 2126, code stroke called at 2101. Hx of seizures with right AKA.

## 2021-02-15 DIAGNOSIS — Z86718 Personal history of other venous thrombosis and embolism: Secondary | ICD-10-CM

## 2021-02-15 DIAGNOSIS — R569 Unspecified convulsions: Secondary | ICD-10-CM | POA: Diagnosis not present

## 2021-02-15 DIAGNOSIS — Z9229 Personal history of other drug therapy: Secondary | ICD-10-CM

## 2021-02-15 LAB — COMPREHENSIVE METABOLIC PANEL
ALT: 23 U/L (ref 0–44)
AST: 22 U/L (ref 15–41)
Albumin: 3.3 g/dL — ABNORMAL LOW (ref 3.5–5.0)
Alkaline Phosphatase: 89 U/L (ref 38–126)
Anion gap: 6 (ref 5–15)
BUN: 11 mg/dL (ref 6–20)
CO2: 28 mmol/L (ref 22–32)
Calcium: 8.9 mg/dL (ref 8.9–10.3)
Chloride: 103 mmol/L (ref 98–111)
Creatinine, Ser: 1.1 mg/dL — ABNORMAL HIGH (ref 0.44–1.00)
GFR, Estimated: 60 mL/min — ABNORMAL LOW (ref >60)
Glucose, Bld: 102 mg/dL — ABNORMAL HIGH (ref 70–99)
Potassium: 3.3 mmol/L — ABNORMAL LOW (ref 3.5–5.1)
Sodium: 137 mmol/L (ref 135–145)
Total Bilirubin: 0.5 mg/dL (ref 0.3–1.2)
Total Protein: 6.3 g/dL — ABNORMAL LOW (ref 6.5–8.1)

## 2021-02-15 LAB — CBC WITH DIFFERENTIAL/PLATELET
Abs Immature Granulocytes: 0.01 10*3/uL (ref 0.00–0.07)
Basophils Absolute: 0 10*3/uL (ref 0.0–0.1)
Basophils Relative: 1 %
Eosinophils Absolute: 0.1 10*3/uL (ref 0.0–0.5)
Eosinophils Relative: 2 %
HCT: 36.9 % (ref 36.0–46.0)
Hemoglobin: 11.9 g/dL — ABNORMAL LOW (ref 12.0–15.0)
Immature Granulocytes: 0 %
Lymphocytes Relative: 60 %
Lymphs Abs: 2.3 10*3/uL (ref 0.7–4.0)
MCH: 29 pg (ref 26.0–34.0)
MCHC: 32.2 g/dL (ref 30.0–36.0)
MCV: 90 fL (ref 80.0–100.0)
Monocytes Absolute: 0.3 10*3/uL (ref 0.1–1.0)
Monocytes Relative: 7 %
Neutro Abs: 1.1 10*3/uL — ABNORMAL LOW (ref 1.7–7.7)
Neutrophils Relative %: 30 %
Platelets: 218 10*3/uL (ref 150–400)
RBC: 4.1 MIL/uL (ref 3.87–5.11)
RDW: 13.1 % (ref 11.5–15.5)
WBC: 3.8 10*3/uL — ABNORMAL LOW (ref 4.0–10.5)
nRBC: 0 % (ref 0.0–0.2)

## 2021-02-15 LAB — MAGNESIUM: Magnesium: 1.8 mg/dL (ref 1.7–2.4)

## 2021-02-15 LAB — PROTIME-INR
INR: 1.8 — ABNORMAL HIGH (ref 0.8–1.2)
Prothrombin Time: 20.9 seconds — ABNORMAL HIGH (ref 11.4–15.2)

## 2021-02-15 LAB — GLUCOSE, CAPILLARY: Glucose-Capillary: 120 mg/dL — ABNORMAL HIGH (ref 70–99)

## 2021-02-15 MED ORDER — POTASSIUM CHLORIDE CRYS ER 20 MEQ PO TBCR
40.0000 meq | EXTENDED_RELEASE_TABLET | Freq: Once | ORAL | Status: AC
  Administered 2021-02-15: 10:00:00 40 meq via ORAL
  Filled 2021-02-15: qty 2

## 2021-02-15 MED ORDER — WARFARIN SODIUM 5 MG PO TABS
10.0000 mg | ORAL_TABLET | Freq: Once | ORAL | Status: AC
  Administered 2021-02-15: 16:00:00 10 mg via ORAL
  Filled 2021-02-15: qty 2

## 2021-02-15 NOTE — Progress Notes (Addendum)
ANTICOAGULATION CONSULT NOTE - Follow Up Consult  Pharmacy Consult for Warfarin Indication:  history of VTE  Allergies  Allergen Reactions   Vicodin [Hydrocodone-Acetaminophen] Nausea And Vomiting   Morphine And Related Rash    Allergic reaction only in iv form    Patient Measurements: Height: 5\' 6"  (167.6 cm) Weight: 78.5 kg (173 lb) IBW/kg (Calculated) : 59.3 kg  Vital Signs: Temp: 98.2 F (36.8 C) (12/25 0338) Temp Source: Oral (12/24 2313) BP: 124/71 (12/25 0338) Pulse Rate: 76 (12/25 0338)  Labs: Recent Labs    02/13/21 2212 02/13/21 2227 02/14/21 0240 02/15/21 0350  HGB 11.8* 13.3 11.7* 11.9*  HCT 37.1 39.0 36.9 36.9  PLT 168  --  195 218  APTT 38*  --   --   --   LABPROT 20.6*  --  20.1* 20.9*  INR 1.8*  --  1.7* 1.8*  CREATININE 1.17* 1.00 0.99 1.10*    Estimated Creatinine Clearance: 61.8 mL/min (A) (by C-G formula based on SCr of 1.1 mg/dL (H)).   Medications:  Medications Prior to Admission  Medication Sig Dispense Refill Last Dose   acetaminophen (TYLENOL) 650 MG CR tablet Take 1,300 mg by mouth every 8 (eight) hours as needed for pain.   unk   Ascorbic Acid (VITAMIN C) 1000 MG tablet Take 1,000 mg by mouth daily.   02/12/2021   cloBAZam (ONFI) 10 MG tablet Take 0.5 tablets (5 mg total) by mouth daily. 15 tablet 3 02/13/2021   clonazePAM (KLONOPIN) 1 MG disintegrating tablet Take 1 tablet (1 mg total) by mouth 2 (two) times daily as needed for seizure. 30 tablet 0 02/13/2021   ketoconazole (NIZORAL) 2 % shampoo Apply 1 application topically 2 (two) times a week. Monday,wednesday   02/11/2021   lacosamide (VIMPAT) 200 MG TABS tablet TAKE 1 TABLET BY MOUTH TWICE A DAY (Patient taking differently: Take 200 mg by mouth 2 (two) times daily.) 180 tablet 1 02/13/2021   lamoTRIgine (LAMICTAL) 100 MG tablet Take 1 tablet (100 mg total) by mouth 2 (two) times daily. (Patient taking differently: Take 100 mg by mouth 2 (two) times daily. Take along with 200 mg  tablet) 180 tablet 1 02/13/2021   lamoTRIgine (LAMICTAL) 200 MG tablet TAKE 1 TABLET BY MOUTH TWICE A DAY (Patient taking differently: Take 200 mg by mouth 2 (two) times daily. Take along with 100 mg tablet) 180 tablet 3 02/13/2021   levETIRAcetam (KEPPRA) 1000 MG tablet TAKE 1.5 TABLETS IN THE MORNING AND 2 TABLETS IN THE EVENING (Patient taking differently: Take 1,500-2,000 mg by mouth See admin instructions. 1500 mg in the morning 2000 mg at bedtime) 315 tablet 3 02/13/2021   levothyroxine (SYNTHROID, LEVOTHROID) 50 MCG tablet Take 50 mcg by mouth every evening.   02/12/2021   Midazolam (NAYZILAM) 5 MG/0.1ML SOLN One spray nasally as needed for up to one dose. Use if needed for prolonged seizure greater then 5 minutes. (Patient taking differently: Place 5 mg into the nose See admin instructions. Use if needed for prolonged seizure greater then 5 minutes.) 1 each 3 unk   omeprazole (PRILOSEC) 20 MG capsule Take 20 mg by mouth at bedtime.   02/12/2021   Prenatal Vit-Fe Fumarate-FA (PRENATAL MULTIVITAMIN) TABS tablet Take 1 tablet by mouth daily.   02/12/2021   promethazine (PHENERGAN) 25 MG tablet Take 25 mg by mouth every 8 (eight) hours as needed for nausea or vomiting.   02/13/2021   topiramate (TOPAMAX) 25 MG tablet Take 25 mg by mouth 2 (two)  times daily as needed (pain).   02/13/2021   venlafaxine XR (EFFEXOR-XR) 150 MG 24 hr capsule Take 1 capsule (150 mg total) by mouth daily with breakfast. 90 capsule 0 02/13/2021   warfarin (COUMADIN) 5 MG tablet Take 1.5-2 tablets (7.5-10 mg total) by mouth See admin instructions. Pt takes one and a half tablets (7.5mg  total) on Sunday and Wednesday and 2 tablets (10mg  total) all other days. (Patient taking differently: Take 7.5-10 mg by mouth See admin instructions. 5 mg Friday, Monday 7.5 mg Saturday,Sunday)   02/10/2021   lamoTRIgine (LAMICTAL) 25 MG tablet TAKE 2 TABLETS BY MOUTH TWICE A DAY (Patient not taking: Reported on 02/13/2021) 360 tablet 3 Not  Taking   Scheduled:   cloBAZam  5 mg Oral QHS   enoxaparin (LOVENOX) injection  80 mg Subcutaneous Q12H   lacosamide  200 mg Oral BID   lamoTRIgine  350 mg Oral BID   levETIRAcetam  1,500 mg Oral q AM   levETIRAcetam  2,000 mg Oral QHS   levothyroxine  50 mcg Oral Q0600   pantoprazole  40 mg Oral QHS   topiramate  25 mg Oral BID   venlafaxine XR  150 mg Oral Q breakfast   Warfarin - Pharmacist Dosing Inpatient   Does not apply q1600   Infusions:   levETIRAcetam Stopped (02/14/21 0010)   levETIRAcetam Stopped (02/14/21 0741)   PRN: acetaminophen **OR** acetaminophen, clonazePAM  Assessment: 55 yo female presents as code stroke, neuro suspects recrudescence of prior stroke symptoms in the setting of a potential infectious or metabolic abnormality. INR 1.8 upon admission. 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 Pharmacy is consulted to dose heparin.  Per 12/23 appointment, the patient was supposed to HOLD warfarin on 12/21 and 12/22 then take 5 mg on 12/23, 7.5 mg on 12/24 and 12/25, 5 mg on 12/26, and return for an INR check on 12/27. The patient presented to the hospital prior to starting this regimen.  Warfarin PTA Regimen: 10 mg daily except 7.5 mg Sun/Wed  INR subtherapeutic today at 1.8. No signs or symptoms of bleeding are noted. Hgb 11.9, platelets 218. No new interacting medications have been initiated and the patient's diet remains stable.   Goal of Therapy:  INR 2-3 Monitor platelets by anticoagulation protocol: Yes   Plan:  Give warfarin PO 10 mg x 1 dose tonight Continue enoxaparin SubQ 80 mg Q12H until INR >2 Obtain a daily PT/INR and Q72H CBC Monitor for signs and symptoms of bleeding  Shauna Hugh, PharmD, [place]  PGY-2 Pharmacy Resident 02/15/2021 7:40 AM  Please check [email or web address] for unit-specific pharmacy phone numbers.

## 2021-02-15 NOTE — Progress Notes (Addendum)
Patient ID: Cassandra Andrade, female   DOB: 1966/01/04, 55 y.o.   MRN: [id number]  PROGRESS NOTE    Chevelle Coulson  QMV:[id number] DOB: [birth year] DOA: 02/13/2021 PCP: Berkley Harvey, NP   Brief Narrative:   55 y.o. 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  Patient apparently also had a seizure a day prior to presentation.  In the ED, code stroke was initially called and patient was seen by neurology.  CT of the head and CT angiogram of head and neck were unremarkable.  COVID testing was negative.  Assessment & Plan:   Left-sided weakness with lethargy and dysarthria History of seizures -Neurology following and was of the opinion that her symptoms were secondary to potential infectious or metabolic abnormality. -CT of the head and CT angiogram of head and neck were unremarkable.  COVID testing was negative.  MRI of brain is still pending.  UA was unremarkable.  No evidence of infection identified so far including negative chest x-ray. -EEG showed focal slowing over the right frontotemporal region but no seizures identified. -Continue current doses of Vimpat, lacosamide, Keppra and Onfi.  Continue as needed Ativan/clonazepam for seizures.  Follow further neurology recommendations -PT recommends home health PT.  History of DVT and lupus anticoagulant -Currently on on Lovenox and Coumadin.  INR 1.8 today.  Pharmacy following.  Monitor daily INR.  Hypothyroidism -Continue levothyroxine  Hypokalemia -Replace.  Repeat a.m. labs  Prior history of right lower extremity osteosarcoma status post amputation    DVT  prophylaxis: Coumadin on Lovenox Code Status: Full Family Communication: None at bedside Disposition Plan: Status is: Observation  The patient will require care spanning > 2 midnights and should be moved to inpatient because: Of need for MRI of brain.  Consultants: Neurology  Procedures: EEG  Antimicrobials: None   Subjective: Patient seen and examined at bedside.  Sleepy, wakes up slightly, denied any seizures, nausea, vomiting.  Poor historian.  Objective: Vitals:   02/14/21 1940 02/14/21 2313 02/15/21 0338 02/15/21 0752  BP: 114/70 112/68 124/71 113/79  Pulse: 83 88 76 77  Resp: 14 14 20 18   Temp: 98.6 F (37 C) 98 F (36.7 C) 98.2 F (36.8 C) 98.4 F (36.9 C)  TempSrc:  Oral  Oral  SpO2: 100% 100% 100% 100%  Weight:      Height:        Intake/Output Summary (Last 24 hours) at 02/15/2021 1018 Last data filed at 02/15/2021 0341 Gross per 24 hour  Intake --  Output 400 ml  Net -400 ml   Filed Weights   02/14/21 0800  Weight: 78.5 kg    Examination:  General exam: Appears calm and comfortable.  Currently on room air. Respiratory system: Bilateral decreased breath sounds at bases Cardiovascular system: S1 & S2 heard, Rate controlled Gastrointestinal system: Abdomen is nondistended, soft and nontender. Normal bowel sounds heard. Extremities: No cyanosis, clubbing, edema on the left lower extremity.  Right AKA present Central nervous system: Wakes up slightly, slow to respond, poor historian.  No focal neurological deficits. Moving extremities.  No obvious seizures noted currently Skin: No rashes, lesions or ulcers Psychiatry: Affect is  mostly flat   Data Reviewed: I have personally reviewed following labs and imaging studies  CBC: Recent Labs  Lab 02/13/21 2212 02/13/21 2227 02/14/21 0240 02/15/21 0350  WBC 5.0  --  4.4 3.8*  NEUTROABS 2.3  --   --  1.1*  HGB 11.8* 13.3 11.7* 11.9*  HCT 37.1 39.0 36.9 36.9  MCV 90.9  --  91.1 90.0  PLT 168  --   195 226   Basic Metabolic Panel: Recent Labs  Lab 02/13/21 2212 02/13/21 2227 02/14/21 0240 02/15/21 0350  NA 139 141 139 137  K 3.6 3.6 3.4* 3.3*  CL 102 102 104 103  CO2 30  --  27 28  GLUCOSE 97 92 105* 102*  BUN 13 15 11 11   CREATININE 1.17* 1.00 0.99 1.10*  CALCIUM 9.5  --  9.0 8.9  MG  --   --  1.7 1.8   GFR: Estimated Creatinine Clearance: 61.8 mL/min (A) (by C-G formula based on SCr of 1.1 mg/dL (H)). Liver Function Tests: Recent Labs  Lab 02/13/21 2212 02/15/21 0350  AST 27 22  ALT 27 23  ALKPHOS 116 89  BILITOT 0.5 0.5  PROT 7.0 6.3*  ALBUMIN 3.8 3.3*   No results for input(s): LIPASE, AMYLASE in the last 168 hours. No results for input(s): AMMONIA in the last 168 hours. Coagulation Profile: Recent Labs  Lab 02/13/21 2212 02/14/21 0240 02/15/21 0350  INR 1.8* 1.7* 1.8*   Cardiac Enzymes: No results for input(s): CKTOTAL, CKMB, CKMBINDEX, TROPONINI in the last 168 hours. BNP (last 3 results) No results for input(s): PROBNP in the last 8760 hours. HbA1C: No results for input(s): HGBA1C in the last 72 hours. CBG: Recent Labs  Lab 02/14/21 0815 02/14/21 2317  GLUCAP 88 110*   Lipid Profile: No results for input(s): CHOL, HDL, LDLCALC, TRIG, CHOLHDL, LDLDIRECT in the last 72 hours. Thyroid Function Tests: No results for input(s): TSH, T4TOTAL, FREET4, T3FREE, THYROIDAB in the last 72 hours. Anemia Panel: No results for input(s): VITAMINB12, FOLATE, FERRITIN, TIBC, IRON, RETICCTPCT in the last 72 hours. Sepsis Labs: Recent Labs  Lab 02/14/21 0240  PROCALCITON <0.10    Recent Results (from the past 240 hour(s))  Resp Panel by RT-PCR (Flu A&B, Covid) Nasopharyngeal Swab     Status: None   Collection Time: 02/13/21 11:24 PM   Specimen: Nasopharyngeal Swab; Nasopharyngeal(NP) swabs in vial transport medium  Result Value Ref Range Status   SARS Coronavirus 2 by RT PCR NEGATIVE NEGATIVE Final    Comment: (NOTE) SARS-CoV-2 target nucleic acids  are NOT DETECTED.  The SARS-CoV-2 RNA is generally detectable in upper respiratory specimens during the acute phase of infection. The lowest concentration of SARS-CoV-2 viral copies this assay can detect is 138 copies/mL. A negative result does not preclude SARS-Cov-2 infection and should not be used as the sole basis for treatment or other patient management decisions. A negative result may occur with  improper specimen collection/handling, submission of specimen other than nasopharyngeal swab, presence of viral mutation(s) within the areas targeted by this assay, and inadequate number of viral copies(<138 copies/mL). A negative result must be combined with clinical observations, patient history, and epidemiological information. The expected result is Negative.  Fact Sheet for Patients:  EntrepreneurPulse.com.au  Fact Sheet for Healthcare Providers:  IncredibleEmployment.be  This test is no t yet approved or cleared by the [place] FDA and  has been authorized for detection and/or diagnosis of SARS-CoV-2 by FDA under an Emergency Use Authorization (EUA).  This EUA will remain  in effect (meaning this test can be used) for the duration of the COVID-19 declaration under Section 564(b)(1) of the Act, 21 U.S.C.section 360bbb-3(b)(1), unless the authorization is terminated  or revoked sooner.       Influenza A by PCR NEGATIVE NEGATIVE Final   Influenza B by PCR NEGATIVE NEGATIVE Final    Comment: (NOTE) The Xpert Xpress SARS-CoV-2/FLU/RSV plus assay is intended as an aid in the diagnosis of influenza from Nasopharyngeal swab specimens and should not be used as a sole basis for treatment. Nasal washings and aspirates are unacceptable for Xpert Xpress SARS-CoV-2/FLU/RSV testing.  Fact Sheet for Patients: EntrepreneurPulse.com.au  Fact Sheet for Healthcare Providers: IncredibleEmployment.be  This test is  not yet approved or cleared by the [place] FDA and has been authorized for detection and/or diagnosis of SARS-CoV-2 by FDA under an Emergency Use Authorization (EUA). This EUA will remain in effect (meaning this test can be used) for the duration of the COVID-19 declaration under Section 564(b)(1) of the Act, 21 U.S.C. section 360bbb-3(b)(1), unless the authorization is terminated or revoked.  Performed at [facility], [place] [address], [place], [place] [address]          Radiology Studies: DG CHEST PORT 1 VIEW  Result Date: 02/14/2021 CLINICAL DATA:  Seizure. EXAM: PORTABLE CHEST 1 VIEW COMPARISON:  02/07/2013. FINDINGS: The heart size and mediastinal contours are within normal limits. The left lung apex is excluded from the field of view. No consolidation, effusion, or pneumothorax is seen. No acute osseous abnormality. A neurostimulator device is present over the left chest with leads coursing superiorly. IMPRESSION: No acute cardiopulmonary process. Electronically Signed   By: Brett Fairy M.D.   On: 02/14/2021 03:10   EEG adult  Result Date: 02/14/2021 Derek Jack, MD     02/14/2021 10:20 AM Routine EEG Report Suleika Kellenberger is a 55 y.o. female with a history of seizure who is undergoing an EEG to evaluate for seizures. Report: This EEG was acquired with electrodes placed according to the International 10-20 electrode system (including Fp1, Fp2, F3, F4, C3, C4, P3, P4, O1, O2, T3, T4, T5, T6, A1, A2, Fz, Cz, Pz). The following electrodes were missing or displaced: none. The occipital dominant rhythm was 9 Hz. This activity is reactive to stimulation. Drowsiness was manifested by background fragmentation; deeper stages of sleep were identified by K complexes and sleep spindles. There was focal slowing over the right frontotemporal region. There were no interictal epileptiform discharges. There were no electrographic seizures identified. There was no abnormal response to  photic stimulation. Hyperventilation was not performed. Impression and clinical correlation: This EEG was obtained while awake and asleep and is abnormal due to focal slowing over the right frontotemporal region, indicating focal cerebral dysfunction in that area.   Su Monks, MD [facility] [phone] If 7pm- 7am, please page neurology on call as listed in [place].   CT HEAD CODE STROKE WO CONTRAST  Result Date: 02/13/2021 CLINICAL DATA:  Code stroke. Left facial droop with confusion and slurred speech EXAM: CT HEAD WITHOUT CONTRAST TECHNIQUE: Contiguous axial images were obtained from the base of the skull through the vertex without intravenous contrast. COMPARISON:  08/19/2020 FINDINGS: Brain: There is no mass, hemorrhage or extra-axial collection. There is encephalomalacia of the right frontal operculum and anterior right temporal lobe. Ex vacuo dilatation of the right lateral ventricle. Vascular: No abnormal hyperdensity of the major intracranial arteries or dural venous sinuses. No intracranial atherosclerosis.  Skull: Remote right-sided craniotomy. Sinuses/Orbits: No fluid levels or advanced mucosal thickening of the visualized paranasal sinuses. No mastoid or middle ear effusion. The orbits are normal. ASPECTS [facility] Stroke Program Early CT Score) - Ganglionic level infarction (caudate, lentiform nuclei, internal capsule, insula, M1-M3 cortex): 7 - Supraganglionic infarction (M4-M6 cortex): 3 Total score (0-10 with 10 being normal): 10 IMPRESSION: 1. No acute intracranial abnormality. 2. ASPECTS is 10. 3. Encephalomalacia of the right frontal operculum and anterior right temporal lobe. These results were communicated to Dr. Donnetta Simpers at 9:44 pm on 02/13/2021 by text page via the [facility] messaging system. Electronically Signed   By: Ulyses Jarred M.D.   On: 02/13/2021 21:44   CT ANGIO HEAD NECK W WO CM (CODE STROKE)  Result Date: 02/13/2021 CLINICAL DATA:  Left-sided weakness  and slurred speech EXAM: CT ANGIOGRAPHY HEAD AND NECK TECHNIQUE: Multidetector CT imaging of the head and neck was performed using the standard protocol during bolus administration of intravenous contrast. Multiplanar CT image reconstructions and MIPs were obtained to evaluate the vascular anatomy. Carotid stenosis measurements (when applicable) are obtained utilizing NASCET criteria, using the distal internal carotid diameter as the denominator. CONTRAST:  42mL OMNIPAQUE IOHEXOL 350 MG/ML SOLN COMPARISON:  None. FINDINGS: CTA NECK FINDINGS SKELETON: There is no bony spinal canal stenosis. No lytic or blastic lesion. OTHER NECK: Normal pharynx, larynx and major salivary glands. No cervical lymphadenopathy. Unremarkable thyroid gland. UPPER CHEST: No pneumothorax or pleural effusion. No nodules or masses. AORTIC ARCH: There is no calcific atherosclerosis of the aortic arch. There is no aneurysm, dissection or hemodynamically significant stenosis of the visualized portion of the aorta. Conventional 3 vessel aortic branching pattern. The visualized proximal subclavian arteries are widely patent. RIGHT CAROTID SYSTEM: Normal without aneurysm, dissection or stenosis. LEFT CAROTID SYSTEM: Normal without aneurysm, dissection or stenosis. VERTEBRAL ARTERIES: Left dominant configuration. Both origins are clearly patent. There is no dissection, occlusion or flow-limiting stenosis to the skull base (V1-V3 segments). CTA HEAD FINDINGS POSTERIOR CIRCULATION: --Vertebral arteries: Normal V4 segments. --Inferior cerebellar arteries: Normal. --Basilar artery: Normal. --Superior cerebellar arteries: Normal. --Posterior cerebral arteries (PCA): Severe right P2 segment stenosis. Left PCA is normal. ANTERIOR CIRCULATION: --Intracranial internal carotid arteries: Normal. --Anterior cerebral arteries (ACA): Normal. Both A1 segments are present. Patent anterior communicating artery (a-comm). --Middle cerebral arteries (MCA): Normal.  VENOUS SINUSES: As permitted by contrast timing, patent. ANATOMIC VARIANTS: None Review of the MIP images confirms the above findings. IMPRESSION: 1. No emergent large vessel occlusion. 2. Severe right PCA P2 segment stenosis. Aortic Atherosclerosis (ICD10-I70.0). Electronically Signed   By: Ulyses Jarred M.D.   On: 02/13/2021 22:48   CT US GUIDE VASC ACCESS LT NO REPORT  Result Date: 02/13/2021 There is no Radiologist interpretation  for this exam.       Scheduled Meds:  cloBAZam  5 mg Oral QHS   enoxaparin (LOVENOX) injection  80 mg Subcutaneous Q12H   lacosamide  200 mg Oral BID   lamoTRIgine  350 mg Oral BID   levETIRAcetam  1,500 mg Oral q AM   levETIRAcetam  2,000 mg Oral QHS   levothyroxine  50 mcg Oral Q0600   pantoprazole  40 mg Oral QHS   topiramate  25 mg Oral BID   venlafaxine XR  150 mg Oral Q breakfast   warfarin  10 mg Oral ONCE-1600   Warfarin - Pharmacist Dosing Inpatient   Does not apply q1600   Continuous Infusions:  levETIRAcetam Stopped (02/14/21 0010)   levETIRAcetam Stopped (  02/14/21 0741)          Aline August, MD [facility] 02/15/2021, 10:18 AM

## 2021-02-16 ENCOUNTER — Observation Stay (HOSPITAL_COMMUNITY): Payer: Medicare Other

## 2021-02-16 DIAGNOSIS — Z818 Family history of other mental and behavioral disorders: Secondary | ICD-10-CM | POA: Diagnosis not present

## 2021-02-16 DIAGNOSIS — D6862 Lupus anticoagulant syndrome: Secondary | ICD-10-CM | POA: Diagnosis present

## 2021-02-16 DIAGNOSIS — Z885 Allergy status to narcotic agent status: Secondary | ICD-10-CM | POA: Diagnosis not present

## 2021-02-16 DIAGNOSIS — Z8679 Personal history of other diseases of the circulatory system: Secondary | ICD-10-CM | POA: Diagnosis not present

## 2021-02-16 DIAGNOSIS — I69354 Hemiplegia and hemiparesis following cerebral infarction affecting left non-dominant side: Secondary | ICD-10-CM | POA: Diagnosis not present

## 2021-02-16 DIAGNOSIS — Z833 Family history of diabetes mellitus: Secondary | ICD-10-CM | POA: Diagnosis not present

## 2021-02-16 DIAGNOSIS — E039 Hypothyroidism, unspecified: Secondary | ICD-10-CM | POA: Diagnosis present

## 2021-02-16 DIAGNOSIS — Z85830 Personal history of malignant neoplasm of bone: Secondary | ICD-10-CM | POA: Diagnosis not present

## 2021-02-16 DIAGNOSIS — F419 Anxiety disorder, unspecified: Secondary | ICD-10-CM | POA: Diagnosis present

## 2021-02-16 DIAGNOSIS — Z9229 Personal history of other drug therapy: Secondary | ICD-10-CM | POA: Diagnosis not present

## 2021-02-16 DIAGNOSIS — G9349 Other encephalopathy: Secondary | ICD-10-CM | POA: Diagnosis present

## 2021-02-16 DIAGNOSIS — R1110 Vomiting, unspecified: Secondary | ICD-10-CM | POA: Diagnosis present

## 2021-02-16 DIAGNOSIS — R569 Unspecified convulsions: Secondary | ICD-10-CM | POA: Diagnosis present

## 2021-02-16 DIAGNOSIS — G9389 Other specified disorders of brain: Secondary | ICD-10-CM | POA: Diagnosis present

## 2021-02-16 DIAGNOSIS — F32A Depression, unspecified: Secondary | ICD-10-CM | POA: Diagnosis present

## 2021-02-16 DIAGNOSIS — R509 Fever, unspecified: Secondary | ICD-10-CM | POA: Diagnosis present

## 2021-02-16 DIAGNOSIS — Z7901 Long term (current) use of anticoagulants: Secondary | ICD-10-CM | POA: Diagnosis not present

## 2021-02-16 DIAGNOSIS — Z7409 Other reduced mobility: Secondary | ICD-10-CM | POA: Diagnosis present

## 2021-02-16 DIAGNOSIS — Z86718 Personal history of other venous thrombosis and embolism: Secondary | ICD-10-CM | POA: Diagnosis not present

## 2021-02-16 DIAGNOSIS — R29810 Facial weakness: Secondary | ICD-10-CM | POA: Diagnosis present

## 2021-02-16 DIAGNOSIS — R471 Dysarthria and anarthria: Secondary | ICD-10-CM | POA: Diagnosis present

## 2021-02-16 DIAGNOSIS — Z20822 Contact with and (suspected) exposure to covid-19: Secondary | ICD-10-CM | POA: Diagnosis present

## 2021-02-16 DIAGNOSIS — Z79899 Other long term (current) drug therapy: Secondary | ICD-10-CM | POA: Diagnosis not present

## 2021-02-16 DIAGNOSIS — Z79890 Hormone replacement therapy (postmenopausal): Secondary | ICD-10-CM | POA: Diagnosis not present

## 2021-02-16 DIAGNOSIS — E876 Hypokalemia: Secondary | ICD-10-CM | POA: Diagnosis present

## 2021-02-16 DIAGNOSIS — G40919 Epilepsy, unspecified, intractable, without status epilepticus: Secondary | ICD-10-CM | POA: Diagnosis present

## 2021-02-16 DIAGNOSIS — W2209XA Striking against other stationary object, initial encounter: Secondary | ICD-10-CM | POA: Diagnosis present

## 2021-02-16 LAB — CBC
HCT: 35 % — ABNORMAL LOW (ref 36.0–46.0)
Hemoglobin: 11.4 g/dL — ABNORMAL LOW (ref 12.0–15.0)
MCH: 29.3 pg (ref 26.0–34.0)
MCHC: 32.6 g/dL (ref 30.0–36.0)
MCV: 90 fL (ref 80.0–100.0)
Platelets: 187 10*3/uL (ref 150–400)
RBC: 3.89 MIL/uL (ref 3.87–5.11)
RDW: 13.2 % (ref 11.5–15.5)
WBC: 4.1 10*3/uL (ref 4.0–10.5)
nRBC: 0 % (ref 0.0–0.2)

## 2021-02-16 LAB — BASIC METABOLIC PANEL
Anion gap: 9 (ref 5–15)
BUN: 11 mg/dL (ref 6–20)
CO2: 25 mmol/L (ref 22–32)
Calcium: 9 mg/dL (ref 8.9–10.3)
Chloride: 104 mmol/L (ref 98–111)
Creatinine, Ser: 1.13 mg/dL — ABNORMAL HIGH (ref 0.44–1.00)
GFR, Estimated: 58 mL/min — ABNORMAL LOW (ref >60)
Glucose, Bld: 99 mg/dL (ref 70–99)
Potassium: 4 mmol/L (ref 3.5–5.1)
Sodium: 138 mmol/L (ref 135–145)

## 2021-02-16 LAB — GLUCOSE, CAPILLARY
Glucose-Capillary: 125 mg/dL — ABNORMAL HIGH (ref 70–99)
Glucose-Capillary: 87 mg/dL (ref 70–99)
Glucose-Capillary: 99 mg/dL (ref 70–99)

## 2021-02-16 LAB — PROTIME-INR
INR: 2.3 — ABNORMAL HIGH (ref 0.8–1.2)
Prothrombin Time: 25.1 seconds — ABNORMAL HIGH (ref 11.4–15.2)

## 2021-02-16 LAB — MAGNESIUM: Magnesium: 1.8 mg/dL (ref 1.7–2.4)

## 2021-02-16 MED ORDER — WARFARIN SODIUM 5 MG PO TABS
10.0000 mg | ORAL_TABLET | Freq: Once | ORAL | Status: AC
  Administered 2021-02-16: 16:00:00 10 mg via ORAL
  Filled 2021-02-16: qty 2

## 2021-02-16 MED ORDER — CLOBAZAM 10 MG PO TABS
10.0000 mg | ORAL_TABLET | Freq: Every day | ORAL | Status: DC
  Administered 2021-02-16 – 2021-02-18 (×3): 10 mg via ORAL
  Filled 2021-02-16 (×3): qty 1

## 2021-02-16 MED ORDER — LORAZEPAM 2 MG/ML IJ SOLN
1.0000 mg | INTRAMUSCULAR | Status: DC | PRN
  Administered 2021-02-19: 12:00:00 2 mg via INTRAVENOUS
  Filled 2021-02-16: qty 1

## 2021-02-16 NOTE — Progress Notes (Signed)
Patient's mother called to speak with patient.  Just prior to this call, patient had been A&O x4 and able to follow commands.  I gave the patient the phone to speak with her mother and she immediately started to say, "I'm sorry, I'm sorry, I'm sorry......[name] " repetitively.  She then dropped the phone in the trash basket next to her and began experiencing gross motor movements; thrashing and scratching self (especially her left leg).  Did not engage with me, but movements remained non-purposeful in nature.  No tremors noted.  No nystagmus noted, but gaze appeared to focus to the right.  Some lip-smacking noted.  Pt was not incontinent at the time.  I was able to slip clonazepam under her tongue and the episode subsided after approximately 5 minutes post medication.  Entire episode lasted approximately 17 minutes. MD in room and assessed patient.  See NO.

## 2021-02-16 NOTE — Progress Notes (Signed)
LTM EEG hooked up and running - no initial skin breakdown - push button tested - neuro notified. [facility] monitoring.  

## 2021-02-16 NOTE — Progress Notes (Signed)
Patient call nurses in during shift report stating she lost her teeth. Both nurses searched bed and room for lost dentures. Nurse call to cafeteria to let them know she may have placed her dentures on her meal tray and they grabbed it. The cafeteria staff that spoke with the nurse stated that they would look on the tray and speak with others to find the teeth. The staff member said she would send dentures to the floor and room if found.

## 2021-02-16 NOTE — Procedures (Addendum)
Patient Name: Cassandra Andrade  MRN: [id number]  Epilepsy Attending: Lora Havens  Referring Physician/Provider: Gardiner Barefoot, NP Duration: 02/16/2021 1617 to 02/17/2021 1617  Patient history: 54 y.o. 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 EEG to evaluate for seizure  Level of alertness: Awake, asleep  AEDs during EEG study: LEV, LTG, LCM, TPM, Onfi  Technical aspects: This EEG study was done with scalp electrodes positioned according to the 10-20 International system of electrode placement. Electrical activity was acquired at a sampling rate of 500Hz  and reviewed with a high frequency filter of 70Hz  and a low frequency filter of 1Hz . EEG data were recorded continuously and digitally stored.   Description: The posterior dominant rhythm consists of 9-10 Hz activity of moderate voltage (25-35 uV) seen predominantly in posterior head regions, symmetric and reactive to eye opening and eye closing. Sleep was characterized by vertex waves, sleep spindles (12 to 14 Hz), maximal frontocentral region.    EEG showed continuous sharply contoured 3 to 6 Hz theta-delta slowing admixed with sharp waves in right frontal region, maximal F8 consistent with breach artifact.   Hyperventilation and photic stimulation were not performed.     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 There is also cortical dysfunction in right frontal consistent with prior craniotomy. No seizures were seen throughout the recording.  [name] [name]

## 2021-02-16 NOTE — Progress Notes (Signed)
ANTICOAGULATION CONSULT NOTE - Follow Up Consult  Pharmacy Consult for Warfarin Indication:  history of VTE  Allergies  Allergen Reactions   Vicodin [Hydrocodone-Acetaminophen] Nausea And Vomiting   Morphine And Related Rash    Allergic reaction only in iv form    Patient Measurements: Height: 5\' 6"  (167.6 cm) Weight: 78.5 kg (173 lb) IBW/kg (Calculated) : 59.3 kg  Vital Signs: Temp: 98.6 F (37 C) (12/26 0600) Temp Source: Oral (12/26 0600) BP: 112/62 (12/26 0600) Pulse Rate: 80 (12/26 0600)  Labs: Recent Labs    02/13/21 2212 02/13/21 2227 02/14/21 0240 02/15/21 0350 02/16/21 0457  HGB 11.8*   < > 11.7* 11.9* 11.4*  HCT 37.1   < > 36.9 36.9 35.0*  PLT 168  --  195 218 187  APTT 38*  --   --   --   --   LABPROT 20.6*  --  20.1* 20.9* 25.1*  INR 1.8*  --  1.7* 1.8* 2.3*  CREATININE 1.17*   < > 0.99 1.10* 1.13*   < > = values in this interval not displayed.     Estimated Creatinine Clearance: 60.2 mL/min (A) (by C-G formula based on SCr of 1.13 mg/dL (H)).   Assessment: 55 yo female presents as code stroke, neuro suspects recrudescence of prior stroke symptoms in the setting of a potential infectious or metabolic abnormality. INR 1.8 upon admission. 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 Pharmacy is consulted to dose heparin.  Per 12/23 appointment, the patient was supposed to HOLD warfarin on 12/21 and 12/22 then take 5 mg on 12/23, 7.5 mg on 12/24 and 12/25, 5 mg on 12/26, and return for an INR check on 12/27. The patient presented to the hospital prior to starting this regimen.  Warfarin PTA Regimen: 10 mg daily except 7.5 mg Sun/Wed  INR 2.3   Goal of Therapy:  INR 2-3 Monitor platelets by anticoagulation protocol: Yes   Plan:  Give warfarin PO 10 mg x 1 dose tonight DC Lovenox Obtain a daily PT/INR and Q72H CBC Monitor for signs and symptoms of bleeding  Thank you Anette Guarneri, PharmD 02/16/2021 8:27 AM  Please check [email or web address] for  unit-specific pharmacy phone numbers.

## 2021-02-16 NOTE — Progress Notes (Signed)
°  Transition of Care [facility]) Screening Note   Patient Details  Name: Cassandra Andrade Date of Birth: 05-16-1965   [facility]) CM/SW Contact:    Geralynn Ochs, LCSW Phone Number: 02/16/2021, 2:03 PM    Transition of Care Department [facility]) has reviewed patient, acknowledging updated recommendation for SNF placement but also noting concerns about possible active seizure activity. We will continue to monitor patient advancement through interdisciplinary progression rounds.

## 2021-02-16 NOTE — Progress Notes (Signed)
Physical Therapy Treatment Patient Details Name: Cassandra Andrade MRN: [id number] DOB: Aug 15, 1965 Today's Date: 02/16/2021   History of Present Illness This 55 y.o. female admitted 02/13/21 with increasing somnolence over previous 2 weeks and worsensing of Lt sided weakness.  CT of head negative for acute abnormality, CTA negative for LVO.  MRI pending.  EEG performed.  Neurology feels weakness is recrudescence of prior stroke in setting of possible infectious or metabolic reasons.  PMH includes: seizure disorder with 2-3 sz/week, h/o stroke related to prior aneurysm rupsture, h/o DVT, h/o Rt LE osteosarcoma, s/p Rt AKA    PT Comments    Pt with acute event with RN this am, unclear at this time if it was seizure activity. Pt awake and following one-step commands with PT, requires max physical assist for moving to/from EOB. Pt also with poor sitting balance today, and has very decreased activity tolerance. By end of session, pt with R upward gaze, Ues trembling, and talking in one-word statements only. RN called to room to assess. At this time, PT feels pt is more appropriate for SNF level of care post-acutely. Would need to get back to PLOF to d/c home with family support.     Recommendations for follow up therapy are one component of a multi-disciplinary discharge planning process, led by the attending physician.  Recommendations may be updated based on patient status, additional functional criteria and insurance authorization.  Follow Up Recommendations  Skilled nursing-short term rehab (<3 hours/day)     Assistance Recommended at Discharge Frequent or constant Supervision/Assistance  Equipment Recommendations  None recommended by PT    Recommendations for Other Services       Precautions / Restrictions Precautions Precautions: Fall;Other (comment) Precaution Comments: seziures; prior R AKA Restrictions Weight Bearing Restrictions: No     Mobility  Bed Mobility Overal bed mobility:  Needs Assistance Bed Mobility: Supine to Sit;Sit to Supine     Supine to sit: Max assist;HOB elevated Sit to supine: Max assist;HOB elevated   General bed mobility comments: max assist for LE and truncal management, scooting to/from EOB, boost up in bed with use of bed pads and trendelenburg function.    Transfers                   General transfer comment: unable to attempt today, level of arousal    Ambulation/Gait                   Stairs             Wheelchair Mobility    Modified Rankin (Stroke Patients Only) Modified Rankin (Stroke Patients Only) Pre-Morbid Rankin Score: Moderately severe disability Modified Rankin: Severe disability     Balance Overall balance assessment: Needs assistance Sitting-balance support: Single extremity supported;Feet supported Sitting balance-Leahy Scale: Poor Sitting balance - Comments: heavy anterior leaning, requiring min PT assist to correct. EOB sitting x5 minutes, PT encouraging RUE use for propping/righting balance   Standing balance support: Reliant on assistive device for balance;Bilateral upper extremity supported Standing balance-Leahy Scale: Poor Standing balance comment: UE support and min A for static standing                            Cognition Arousal/Alertness: Awake/alert Behavior During Therapy: WFL for tasks assessed/performed Overall Cognitive Status: History of cognitive impairments - at baseline  General Comments: son at bedside, pt consistently following one-step commands until end of session, pt then with R upward gaze and UE trembling bilat (RN brought to room to observe)        Exercises      General Comments        Pertinent Vitals/Pain Pain Assessment: No/denies pain    Home Living                          Prior Function            PT Goals (current goals can now be found in the care plan section)  Acute Rehab PT Goals Patient Stated Goal: to get better and walk PT Goal Formulation: With patient/family Time For Goal Achievement: 02/28/21 Potential to Achieve Goals: Good Progress towards PT goals: Not progressing toward goals - comment (increased level of assist)    Frequency    Min 4X/week      PT Plan Discharge plan needs to be updated    Co-evaluation              AM-PAC PT "6 Clicks" Mobility   Outcome Measure  Help needed turning from your back to your side while in a flat bed without using bedrails?: A Lot Help needed moving from lying on your back to sitting on the side of a flat bed without using bedrails?: A Lot Help needed moving to and from a bed to a chair (including a wheelchair)?: Total Help needed standing up from a chair using your arms (e.g., wheelchair or bedside chair)?: Total Help needed to walk in hospital room?: Total Help needed climbing 3-5 steps with a railing? : Total 6 Click Score: 8    End of Session   Activity Tolerance: Patient tolerated treatment well Patient left: in bed;with call bell/phone within reach Nurse Communication: Mobility status PT Visit Diagnosis: Unsteadiness on feet (R26.81);Muscle weakness (generalized) (M62.81);History of falling (Z91.81);Difficulty in walking, not elsewhere classified (R26.2);Other symptoms and signs involving the nervous system (R29.898)     Time: 3546-5681 PT Time Calculation (min) (ACUTE ONLY): 21 min  Charges:  $Therapeutic Activity: 8-22 mins                     Stacie Glaze, PT DPT Acute Rehabilitation Services Pager [phone]  Office [phone]    Louis Matte 02/16/2021, 12:38 PM

## 2021-02-16 NOTE — Progress Notes (Signed)
ANTICOAGULATION CONSULT NOTE - Follow Up Consult  Pharmacy Consult for Warfarin Indication:  history of VTE  Allergies  Allergen Reactions   Vicodin [Hydrocodone-Acetaminophen] Nausea And Vomiting   Morphine And Related Rash    Allergic reaction only in iv form    Patient Measurements: Height: 5\' 6"  (167.6 cm) Weight: 78.5 kg (173 lb) IBW/kg (Calculated) : 59.3 kg  Vital Signs: Temp: 98.6 F (37 C) (12/26 0600) Temp Source: Oral (12/26 0600) BP: 112/62 (12/26 0600) Pulse Rate: 80 (12/26 0600)  Labs: Recent Labs    02/13/21 2212 02/13/21 2227 02/14/21 0240 02/15/21 0350 02/16/21 0457  HGB 11.8*   < > 11.7* 11.9* 11.4*  HCT 37.1   < > 36.9 36.9 35.0*  PLT 168  --  195 218 187  APTT 38*  --   --   --   --   LABPROT 20.6*  --  20.1* 20.9* 25.1*  INR 1.8*  --  1.7* 1.8* 2.3*  CREATININE 1.17*   < > 0.99 1.10* 1.13*   < > = values in this interval not displayed.     Estimated Creatinine Clearance: 60.2 mL/min (A) (by C-G formula based on SCr of 1.13 mg/dL (H)).   Medications:  Medications Prior to Admission  Medication Sig Dispense Refill Last Dose   acetaminophen (TYLENOL) 650 MG CR tablet Take 1,300 mg by mouth every 8 (eight) hours as needed for pain.   unk   Ascorbic Acid (VITAMIN C) 1000 MG tablet Take 1,000 mg by mouth daily.   02/12/2021   cloBAZam (ONFI) 10 MG tablet Take 0.5 tablets (5 mg total) by mouth daily. 15 tablet 3 02/13/2021   clonazePAM (KLONOPIN) 1 MG disintegrating tablet Take 1 tablet (1 mg total) by mouth 2 (two) times daily as needed for seizure. 30 tablet 0 02/13/2021   ketoconazole (NIZORAL) 2 % shampoo Apply 1 application topically 2 (two) times a week. Monday,wednesday   02/11/2021   lacosamide (VIMPAT) 200 MG TABS tablet TAKE 1 TABLET BY MOUTH TWICE A DAY (Patient taking differently: Take 200 mg by mouth 2 (two) times daily.) 180 tablet 1 02/13/2021   lamoTRIgine (LAMICTAL) 100 MG tablet Take 1 tablet (100 mg total) by mouth 2 (two) times  daily. (Patient taking differently: Take 100 mg by mouth 2 (two) times daily. Take along with 200 mg tablet) 180 tablet 1 02/13/2021   lamoTRIgine (LAMICTAL) 200 MG tablet TAKE 1 TABLET BY MOUTH TWICE A DAY (Patient taking differently: Take 200 mg by mouth 2 (two) times daily. Take along with 100 mg tablet) 180 tablet 3 02/13/2021   levETIRAcetam (KEPPRA) 1000 MG tablet TAKE 1.5 TABLETS IN THE MORNING AND 2 TABLETS IN THE EVENING (Patient taking differently: Take 1,500-2,000 mg by mouth See admin instructions. 1500 mg in the morning 2000 mg at bedtime) 315 tablet 3 02/13/2021   levothyroxine (SYNTHROID, LEVOTHROID) 50 MCG tablet Take 50 mcg by mouth every evening.   02/12/2021   Midazolam (NAYZILAM) 5 MG/0.1ML SOLN One spray nasally as needed for up to one dose. Use if needed for prolonged seizure greater then 5 minutes. (Patient taking differently: Place 5 mg into the nose See admin instructions. Use if needed for prolonged seizure greater then 5 minutes.) 1 each 3 unk   omeprazole (PRILOSEC) 20 MG capsule Take 20 mg by mouth at bedtime.   02/12/2021   Prenatal Vit-Fe Fumarate-FA (PRENATAL MULTIVITAMIN) TABS tablet Take 1 tablet by mouth daily.   02/12/2021   promethazine (PHENERGAN) 25 MG tablet  Take 25 mg by mouth every 8 (eight) hours as needed for nausea or vomiting.   02/13/2021   topiramate (TOPAMAX) 25 MG tablet Take 25 mg by mouth 2 (two) times daily as needed (pain).   02/13/2021   venlafaxine XR (EFFEXOR-XR) 150 MG 24 hr capsule Take 1 capsule (150 mg total) by mouth daily with breakfast. 90 capsule 0 02/13/2021   warfarin (COUMADIN) 5 MG tablet Take 1.5-2 tablets (7.5-10 mg total) by mouth See admin instructions. Pt takes one and a half tablets (7.5mg  total) on Sunday and Wednesday and 2 tablets (10mg  total) all other days. (Patient taking differently: Take 7.5-10 mg by mouth See admin instructions. 5 mg Friday, Monday 7.5 mg Saturday,Sunday)   02/10/2021   lamoTRIgine (LAMICTAL) 25 MG  tablet TAKE 2 TABLETS BY MOUTH TWICE A DAY (Patient not taking: Reported on 02/13/2021) 360 tablet 3 Not Taking   Scheduled:   cloBAZam  5 mg Oral QHS   enoxaparin (LOVENOX) injection  80 mg Subcutaneous Q12H   lacosamide  200 mg Oral BID   lamoTRIgine  350 mg Oral BID   levETIRAcetam  1,500 mg Oral q AM   levETIRAcetam  2,000 mg Oral QHS   levothyroxine  50 mcg Oral Q0600   pantoprazole  40 mg Oral QHS   topiramate  25 mg Oral BID   venlafaxine XR  150 mg Oral Q breakfast   Warfarin - Pharmacist Dosing Inpatient   Does not apply q1600   Infusions:   levETIRAcetam Stopped (02/14/21 0010)   levETIRAcetam Stopped (02/14/21 0741)   PRN: acetaminophen **OR** acetaminophen, clonazePAM  Assessment: 55 yo female presents as code stroke, neuro suspects recrudescence of prior stroke symptoms in the setting of a potential infectious or metabolic abnormality. INR 1.8 upon admission. 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 Pharmacy is consulted to dose heparin.  Per 12/23 appointment, the patient was supposed to HOLD warfarin on 12/21 and 12/22 then take 5 mg on 12/23, 7.5 mg on 12/24 and 12/25, 5 mg on 12/26, and return for an INR check on 12/27. The patient presented to the hospital prior to starting this regimen.  Warfarin PTA Regimen: 10 mg daily except 7.5 mg Sun/Wed  INR therapeutic today at 2.3. No signs or symptoms of bleeding are noted. CBC stable. No new interacting medications have been initiated and the patient's diet remains stable.   Goal of Therapy:  INR 2-3 Monitor platelets by anticoagulation protocol: Yes   Plan:  Give warfarin PO 10 mg x 1 dose tonight DC enoxaparin as INR >2 Obtain a daily PT/INR and Q72H CBC Monitor for signs and symptoms of bleeding  Joseph Art, Pharm.D. PGY-1 Pharmacy Resident HYIFO:[phone] 02/16/2021 8:25 AM

## 2021-02-16 NOTE — Progress Notes (Signed)
Patient ID: Cassandra Andrade, female   DOB: Jul 03, 1965, 55 y.o.   MRN: [id number]  PROGRESS NOTE    Cassandra Andrade  YHC:[id number] DOB: Nov 08, 1965 DOA: 02/13/2021 PCP: Berkley Harvey, NP   Brief Narrative:   55 y.o. 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  Patient apparently also had a seizure a day prior to presentation.  In the ED, code stroke was initially called and patient was seen by neurology.  CT of the head and CT angiogram of head and neck were unremarkable.  COVID testing was negative.  Assessment & Plan:   Left-sided weakness with lethargy and dysarthria History of seizures -Neurology following and was of the opinion that her symptoms were secondary to potential infectious or metabolic abnormality. -CT of the head and CT angiogram of head and neck were unremarkable.  COVID testing was negative.  MRI of brain is still pending.  UA was unremarkable.  No evidence of infection identified so far including negative chest x-ray. -EEG showed focal slowing over the right frontotemporal region but no seizures identified. -Continue current doses of Vimpat, lacosamide, Keppra and Onfi.  Continue as needed Ativan/clonazepam for seizures.  Follow further neurology recommendations -PT recommends home health PT.  History of DVT and lupus anticoagulant -INR 2.3 today.  Pharmacy following.  Monitor daily INR.  Continue Coumadin dosed as per pharmacy.  Hypothyroidism -Continue levothyroxine  Hypokalemia -Improved.  Prior history of right lower extremity osteosarcoma status post amputation    DVT prophylaxis:  Coumadin Code Status: Full Family Communication: None at bedside Disposition Plan: Status is: Observation  The patient will require care spanning > 2 midnights and should be moved to inpatient because: Of need for MRI of brain.  Consultants: Neurology  Procedures: EEG  Antimicrobials: None   Subjective: Patient seen and examined at bedside.  Poor historian.  No overnight fever, vomiting or seizures reported.  Nursing staff reports that patient started having abnormal body movements this morning after talking to mother on phone. Objective: Vitals:   02/15/21 1552 02/15/21 1951 02/16/21 0057 02/16/21 0600  BP: 108/64 119/71 124/78 112/62  Pulse: (!) 47 97 79 80  Resp: 18 16 16 15   Temp: 98.6 F (37 C) 98.6 F (37 C) 98.7 F (37.1 C) 98.6 F (37 C)  TempSrc:  Oral Oral Oral  SpO2: 94% 100%  98%  Weight:      Height:        Intake/Output Summary (Last 24 hours) at 02/16/2021 0835 Last data filed at 02/16/2021 0500 Gross per 24 hour  Intake 833.81 ml  Output 900 ml  Net -66.19 ml    Filed Weights   02/14/21 0800  Weight: 78.5 kg    Examination:  General exam: On room air currently.  No distress.  Slow to respond.  Poor historian.  Does not follow much commands.  Having abnormal whole-body movements currently; do not look like seizures. respiratory system: Decreased breath sounds at bases bilaterally cardiovascular system: Rate controlled; S1-S2 heard gastrointestinal system: Abdomen is mildly distended; soft and nontender.  Bowel sounds are heard Extremities: Trace lower extremity edema left lower extremity;  right AKA present  Data Reviewed: I have personally reviewed following labs and imaging studies  CBC: Recent Labs  Lab 02/13/21 2212 02/13/21 2227 02/14/21 0240 02/15/21 0350 02/16/21 0457  WBC 5.0  --  4.4 3.8* 4.1  NEUTROABS 2.3  --   --  1.1*  --   HGB 11.8* 13.3 11.7* 11.9* 11.4*  HCT 37.1 39.0 36.9 36.9 35.0*  MCV 90.9  --  91.1 90.0 90.0  PLT  168  --  195 218 379    Basic Metabolic Panel: Recent Labs  Lab 02/13/21 2212 02/13/21 2227 02/14/21 0240 02/15/21 0350 02/16/21 0457  NA 139 141 139 137 138  K 3.6 3.6 3.4* 3.3* 4.0  CL 102 102 104 103 104  CO2 30  --  27 28 25   GLUCOSE 97 92 105* 102* 99  BUN 13 15 11 11 11   CREATININE 1.17* 1.00 0.99 1.10* 1.13*  CALCIUM 9.5  --  9.0 8.9 9.0  MG  --   --  1.7 1.8 1.8    GFR: Estimated Creatinine Clearance: 60.2 mL/min (A) (by C-G formula based on SCr of 1.13 mg/dL (H)). Liver Function Tests: Recent Labs  Lab 02/13/21 2212 02/15/21 0350  AST 27 22  ALT 27 23  ALKPHOS 116 89  BILITOT 0.5 0.5  PROT 7.0 6.3*  ALBUMIN 3.8 3.3*    No results for input(s): LIPASE, AMYLASE in the last 168 hours. No results for input(s): AMMONIA in the last 168 hours. Coagulation Profile: Recent Labs  Lab 02/13/21 2212 02/14/21 0240 02/15/21 0350 02/16/21 0457  INR 1.8* 1.7* 1.8* 2.3*    Cardiac Enzymes: No results for input(s): CKTOTAL, CKMB, CKMBINDEX, TROPONINI in the last 168 hours. BNP (last 3 results) No results for input(s): PROBNP in the last 8760 hours. HbA1C: No results for input(s): HGBA1C in the last 72 hours. CBG: Recent Labs  Lab 02/14/21 0815 02/14/21 2317 02/15/21 1725 02/16/21 0058  GLUCAP 88 110* 120* 125*    Lipid Profile: No results for input(s): CHOL, HDL, LDLCALC, TRIG, CHOLHDL, LDLDIRECT in the last 72 hours. Thyroid Function Tests: No results for input(s): TSH, T4TOTAL, FREET4, T3FREE, THYROIDAB in the last 72 hours. Anemia Panel: No results for input(s): VITAMINB12, FOLATE, FERRITIN, TIBC, IRON, RETICCTPCT in the last 72 hours. Sepsis Labs: Recent Labs  Lab 02/14/21 0240  PROCALCITON <0.10     Recent Results (from the past 240 hour(s))  Resp Panel by RT-PCR (Flu A&B, Covid) Nasopharyngeal Swab     Status: None   Collection Time: 02/13/21 11:24 PM   Specimen: Nasopharyngeal Swab; Nasopharyngeal(NP) swabs in vial transport medium   Result Value Ref Range Status   SARS Coronavirus 2 by RT PCR NEGATIVE NEGATIVE Final    Comment: (NOTE) SARS-CoV-2 target nucleic acids are NOT DETECTED.  The SARS-CoV-2 RNA is generally detectable in upper respiratory specimens during the acute phase of infection. The lowest concentration of SARS-CoV-2 viral copies this assay can detect is 138 copies/mL. A negative result does not preclude SARS-Cov-2 infection and should not be used as the sole basis for treatment or other patient management decisions. A negative result may occur with  improper specimen collection/handling, submission of specimen other than nasopharyngeal swab, presence of viral mutation(s) within the areas targeted by this assay, and inadequate number of viral copies(<138 copies/mL). A negative result must be combined with clinical observations, patient history, and epidemiological information. The expected result is Negative.  Fact Sheet for Patients:  EntrepreneurPulse.com.au  Fact Sheet for Healthcare Providers:  IncredibleEmployment.be  This  test is no t yet approved or cleared by the [place] and  has been authorized for detection and/or diagnosis of SARS-CoV-2 by FDA under an Emergency Use Authorization (EUA). This EUA will remain  in effect (meaning this test can be used) for the duration of the COVID-19 declaration under Section 564(b)(1) of the Act, 21 U.S.C.section 360bbb-3(b)(1), unless the authorization is terminated  or revoked sooner.       Influenza A by PCR NEGATIVE NEGATIVE Final   Influenza B by PCR NEGATIVE NEGATIVE Final    Comment: (NOTE) The Xpert Xpress SARS-CoV-2/FLU/RSV plus assay is intended as an aid in the diagnosis of influenza from Nasopharyngeal swab specimens and should not be used as a sole basis for treatment. Nasal washings and aspirates are unacceptable for Xpert Xpress SARS-CoV-2/FLU/RSV testing.  Fact Sheet for  Patients: EntrepreneurPulse.com.au  Fact Sheet for Healthcare Providers: IncredibleEmployment.be  This test is not yet approved or cleared by the [place] FDA and has been authorized for detection and/or diagnosis of SARS-CoV-2 by FDA under an Emergency Use Authorization (EUA). This EUA will remain in effect (meaning this test can be used) for the duration of the COVID-19 declaration under Section 564(b)(1) of the Act, 21 U.S.C. section 360bbb-3(b)(1), unless the authorization is terminated or revoked.  Performed at [facility], [place] [address] [place], [place] [address]           Radiology Studies: EEG adult  Result Date: 2021/03/05 Derek Jack, MD     2021-03-05 10:20 AM Routine EEG Report Cassandra Andrade is a 55 y.o. female with a history of seizure who is undergoing an EEG to evaluate for seizures. Report: This EEG was acquired with electrodes placed according to the International 10-20 electrode system (including Fp1, Fp2, F3, F4, C3, C4, P3, P4, O1, O2, T3, T4, T5, T6, A1, A2, Fz, Cz, Pz). The following electrodes were missing or displaced: none. The occipital dominant rhythm was 9 Hz. This activity is reactive to stimulation. Drowsiness was manifested by background fragmentation; deeper stages of sleep were identified by K complexes and sleep spindles. There was focal slowing over the right frontotemporal region. There were no interictal epileptiform discharges. There were no electrographic seizures identified. There was no abnormal response to photic stimulation. Hyperventilation was not performed. Impression and clinical correlation: This EEG was obtained while awake and asleep and is abnormal due to focal slowing over the right frontotemporal region, indicating focal cerebral dysfunction in that area.   Su Monks, MD [facility] [phone] If 7pm- 7am, please page neurology on call as listed in [place].         Scheduled Meds:  cloBAZam  5 mg Oral QHS   lacosamide  200 mg Oral BID   lamoTRIgine  350 mg Oral BID   levETIRAcetam  1,500 mg Oral q AM   levETIRAcetam  2,000 mg Oral QHS   levothyroxine  50 mcg Oral Q0600   pantoprazole  40 mg Oral QHS   topiramate  25 mg Oral BID   venlafaxine XR  150 mg Oral Q breakfast   warfarin  10 mg Oral ONCE-1600   Warfarin - Pharmacist Dosing Inpatient   Does not apply q1600   Continuous Infusions:  levETIRAcetam Stopped (03/05/21 0010)   levETIRAcetam Stopped (March 05, 2021 0741)          Aline August, MD [facility] 02/16/2021, 8:35 AM

## 2021-02-17 DIAGNOSIS — R569 Unspecified convulsions: Secondary | ICD-10-CM | POA: Diagnosis not present

## 2021-02-17 DIAGNOSIS — Z86718 Personal history of other venous thrombosis and embolism: Secondary | ICD-10-CM | POA: Diagnosis not present

## 2021-02-17 DIAGNOSIS — Z9229 Personal history of other drug therapy: Secondary | ICD-10-CM | POA: Diagnosis not present

## 2021-02-17 LAB — BASIC METABOLIC PANEL
Anion gap: 6 (ref 5–15)
BUN: 14 mg/dL (ref 6–20)
CO2: 28 mmol/L (ref 22–32)
Calcium: 9.6 mg/dL (ref 8.9–10.3)
Chloride: 103 mmol/L (ref 98–111)
Creatinine, Ser: 1.28 mg/dL — ABNORMAL HIGH (ref 0.44–1.00)
GFR, Estimated: 50 mL/min — ABNORMAL LOW (ref >60)
Glucose, Bld: 109 mg/dL — ABNORMAL HIGH (ref 70–99)
Potassium: 3.7 mmol/L (ref 3.5–5.1)
Sodium: 137 mmol/L (ref 135–145)

## 2021-02-17 LAB — GLUCOSE, CAPILLARY
Glucose-Capillary: 90 mg/dL (ref 70–99)
Glucose-Capillary: 87 mg/dL (ref 70–99)
Glucose-Capillary: 108 mg/dL — ABNORMAL HIGH (ref 70–99)

## 2021-02-17 LAB — PROTIME-INR
INR: 2.2 — ABNORMAL HIGH (ref 0.8–1.2)
Prothrombin Time: 24.4 seconds — ABNORMAL HIGH (ref 11.4–15.2)

## 2021-02-17 MED ORDER — ONDANSETRON HCL 4 MG/2ML IJ SOLN
4.0000 mg | Freq: Four times a day (QID) | INTRAMUSCULAR | Status: DC | PRN
  Administered 2021-02-17 – 2021-02-18 (×2): 4 mg via INTRAVENOUS
  Filled 2021-02-17 (×2): qty 2

## 2021-02-17 MED ORDER — WARFARIN SODIUM 5 MG PO TABS
10.0000 mg | ORAL_TABLET | Freq: Once | ORAL | Status: AC
  Administered 2021-02-17: 16:00:00 10 mg via ORAL
  Filled 2021-02-17: qty 2

## 2021-02-17 NOTE — Progress Notes (Signed)
LTM maint complete - no skin breakdown under:  FP1 EKG FP2 Continue to monitor

## 2021-02-17 NOTE — Progress Notes (Signed)
ANTICOAGULATION CONSULT NOTE - Follow Up Consult  Pharmacy Consult for Warfarin Indication:  history of VTE  Allergies  Allergen Reactions   Vicodin [Hydrocodone-Acetaminophen] Nausea And Vomiting   Morphine And Related Rash    Allergic reaction only in iv form    Patient Measurements: Height: 5\' 6"  (167.6 cm) Weight: 78.5 kg (173 lb) IBW/kg (Calculated) : 59.3 kg  Vital Signs: Temp: 97.9 F (36.6 C) (12/27 0746) Temp Source: Oral (12/27 0746) BP: 103/62 (12/27 0746) Pulse Rate: 79 (12/27 0746)  Labs: Recent Labs    02/15/21 0350 02/16/21 0457 02/17/21 0021  HGB 11.9* 11.4*  --   HCT 36.9 35.0*  --   PLT 218 187  --   LABPROT 20.9* 25.1* 24.4*  INR 1.8* 2.3* 2.2*  CREATININE 1.10* 1.13* 1.28*     Estimated Creatinine Clearance: 53.1 mL/min (A) (by C-G formula based on SCr of 1.28 mg/dL (H)).   Assessment: 55 yo female presents as code stroke, neuro suspects recrudescence of prior stroke symptoms in the setting of a potential infectious or metabolic abnormality. INR 1.8 upon admission. 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 Pharmacy is consulted to dose heparin.  Per 12/23 appointment, the patient was supposed to HOLD warfarin on 12/21 and 12/22 then take 5 mg on 12/23, 7.5 mg on 12/24 and 12/25, 5 mg on 12/26, and return for an INR check on 12/27. The patient presented to the hospital prior to starting this regimen.  Warfarin PTA Regimen: 10 mg daily except 7.5 mg Sun/Wed  INR 2.2   Goal of Therapy:  INR 2-3 Monitor platelets by anticoagulation protocol: Yes   Plan:  Give warfarin PO 10 mg x 1 dose tonight (normal PTA dose) Obtain a daily PT/INR and Q72H CBC Monitor for signs and symptoms of bleeding  Thank you Vaughan Basta BS, PharmD, BCPS Clinical Pharmacist 02/17/2021 11:22 AM  Please check [email or web address] for unit-specific pharmacy phone numbers.

## 2021-02-17 NOTE — Progress Notes (Signed)
Occupational Therapy Treatment Patient Details Name: Cassandra Andrade MRN: [id number] DOB: [birth year] Today's Date: 02/17/2021   History of present illness This 55 y.o. female admitted 02/13/21 with increasing somnolence over previous 2 weeks and worsensing of Lt sided weakness.  CT of head negative for acute abnormality, CTA negative for LVO.  MRI pending.  EEG performed.  Neurology feels weakness is recrudescence of prior stroke in setting of possible infectious or metabolic reasons.  PMH includes: seizure disorder with 2-3 sz/week, h/o stroke related to prior aneurysm rupsture, h/o DVT, h/o Rt LE osteosarcoma, s/p Rt AKA   OT comments  Pt making incremental progress. She was pulling at EEG wires upon arrival and requesting them to be taken off, emotional response when educated on EEG however easily re-directed to functional task. Pt was able to transfer to EOB with min A and tolerated sitting for ~10 minutes. She followed all simple commands with increased time for initiation. While sitting Eob she was able to groom with min guard and cues to attend to task. D/c plan remains appropriate. OT to continue to follow acutely.    Recommendations for follow up therapy are one component of a multi-disciplinary discharge planning process, led by the attending physician.  Recommendations may be updated based on patient status, additional functional criteria and insurance authorization.    Follow Up Recommendations  Home health OT    Assistance Recommended at Discharge Frequent or constant Supervision/Assistance  Equipment Recommendations  None recommended by OT    Recommendations for Other Services      Precautions / Restrictions Precautions Precautions: Fall;Other (comment) Precaution Comments: seziures; prior R AKA Restrictions Weight Bearing Restrictions: No       Mobility Bed Mobility Overal bed mobility: Needs Assistance Bed Mobility: Supine to Sit;Sit to Supine     Supine to sit: Min  assist Sit to supine: Mod assist   General bed mobility comments: cues fro sequencing +incrased time. mod cues to get back into the bed at the end of the session, pt attempting to go the wrong way    Transfers Overall transfer level: Needs assistance                 General transfer comment: defer due to +1 only this session     Balance Overall balance assessment: Needs assistance Sitting-balance support: Single extremity supported Sitting balance-Leahy Scale: Fair Sitting balance - Comments: sitting EOB with LLE supported on the floor. Pt with L lateral lean in sitting however this si functional position for her                                   ADL either performed or assessed with clinical judgement   ADL Overall ADL's : Needs assistance/impaired     Grooming: Min guard;Sitting Grooming Details (indicate cue type and reason): increased time for initiation                             Functional mobility during ADLs: Minimal assistance (limited to bed only this session) General ADL Comments: easily distracted, requires cues    Extremity/Trunk Assessment Upper Extremity Assessment RUE Deficits / Details: Rt UE tremulous.  Pt and son indicate she has tremors at baseline, but indicate that the tremor may be a bit worse, but they were no clear on this RUE Coordination: decreased fine motor LUE Deficits / Details: Pt with  AROM shoulder, elbow WFL, impaired ability to opose and impaired [facility] Lt hand which she reports is her baseline   Lower Extremity Assessment Lower Extremity Assessment: Defer to PT evaluation        Vision       Perception     Praxis      Cognition Arousal/Alertness: Awake/alert Behavior During Therapy: WFL for tasks assessed/performed Overall Cognitive Status: History of cognitive impairments - at baseline                                 General Comments: pt pulling at EEG wires upon arrival, asking  to take them off. Educated pt, and she continued to request they be removed throguhout session. pt follows all 1 step commands. Throughout session pt perseverating on adopted family members, and difficult to re-direct to task          Exercises     Shoulder Instructions       General Comments VSS on RA, EEG intact    Pertinent Vitals/ Pain       Pain Assessment: Faces Faces Pain Scale: No hurt Pain Intervention(s): Monitored during session  Home Living                                          Prior Functioning/Environment              Frequency  Min 2X/week        Progress Toward Goals  OT Goals(current goals can now be found in the care plan section)  Progress towards OT goals: Progressing toward goals  Acute Rehab OT Goals OT Goal Formulation: With patient Time For Goal Achievement: 02/27/21 Potential to Achieve Goals: Good ADL Goals Pt Will Perform Lower Body Bathing: with min guard assist;sit to/from stand Pt Will Perform Lower Body Dressing: with min guard assist;sit to/from stand Pt Will Transfer to Toilet: with min guard assist;squat pivot transfer;stand pivot transfer;regular height toilet Pt Will Perform Toileting - Clothing Manipulation and hygiene: with min guard assist;sitting/lateral leans  Plan Discharge plan remains appropriate    Co-evaluation                 AM-PAC OT "6 Clicks" Daily Activity     Outcome Measure   Help from another person eating meals?: A Little Help from another person taking care of personal grooming?: A Little Help from another person toileting, which includes using toliet, bedpan, or urinal?: A Lot Help from another person bathing (including washing, rinsing, drying)?: A Lot Help from another person to put on and taking off regular upper body clothing?: A Little Help from another person to put on and taking off regular lower body clothing?: A Lot 6 Click Score: 15    End of Session    OT  Visit Diagnosis: Unsteadiness on feet (R26.81);Cognitive communication deficit (R41.841);Muscle weakness (generalized) (M62.81);Hemiplegia and hemiparesis Hemiplegia - Right/Left: Left Hemiplegia - dominant/non-dominant: Non-Dominant   Activity Tolerance Patient tolerated treatment well   Patient Left in bed;with call bell/phone within reach   Nurse Communication Mobility status        Time: 6387-5643 OT Time Calculation (min): 18 min  Charges: OT General Charges $OT Visit: 1 Visit OT Treatments $Therapeutic Activity: 8-22 mins   Demontre Padin A Marvyn Torrez 02/17/2021, 5:06 PM

## 2021-02-17 NOTE — Progress Notes (Signed)
Pt has VNS implant that needs to be interrogated and turned by Neurology with a progress note put in chart or needs to be turned off in MRI witnessed by MR Technologist before MRI can be done.

## 2021-02-17 NOTE — Plan of Care (Signed)
Plan of care  Patient was started on LTM to capture events however, no reported events - Official read pending. Onfi increased 02/16/2021 to 10mg  nightly. Awaiting MRI.   Electronically signed by:  Lynnae Sandhoff, MD Page: [phone] 02/17/2021, 8:29 AM

## 2021-02-17 NOTE — Plan of Care (Signed)
  Problem: Safety: Goal: Ability to remain free from injury will improve Outcome: Progressing   Problem: Pain Managment: Goal: General experience of comfort will improve Outcome: Progressing   Problem: Skin Integrity: Goal: Risk for impaired skin integrity will decrease Outcome: Progressing   

## 2021-02-17 NOTE — Progress Notes (Signed)
Patient ID: Cassandra Andrade, female   DOB: [birth year], 55 y.o.   MRN: [id number]  PROGRESS NOTE    Cassandra Andrade  NWG:[id number] DOB: 01-Jan-1966 DOA: 02/13/2021 PCP: Berkley Harvey, NP   Brief Narrative:   55 y.o. 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  Patient apparently also had a seizure a day prior to presentation.  In the ED, code stroke was initially called and patient was seen by neurology.  CT of the head and CT angiogram of head and neck were unremarkable.  COVID testing was negative.  Assessment & Plan:   Left-sided weakness with lethargy and dysarthria History of seizures -Neurology following and was of the opinion that her symptoms were secondary to potential infectious or metabolic abnormality. -CT of the head and CT angiogram of head and neck were unremarkable.  COVID testing was negative.  MRI of brain is still pending.  UA was unremarkable.  No evidence of infection identified so far including negative chest x-ray. -EEG showed focal slowing over the right frontotemporal region but no seizures identified. -Continue current doses of Vimpat, lacosamide, Keppra and Onfi.  Onfi dose increased by neurology on 02/16/2021.  Continue as needed Ativan/clonazepam for seizures.  Patient has been started on overnight LTM EEG by neurology.  Follow further neurology recommendations -PT now recommending SNF.  Social worker consulted.  History of DVT and lupus anticoagulant -INR 2.2 today.  Pharmacy following.  Monitor daily INR.  Continue Coumadin dosed as per pharmacy.  Hypothyroidism -Continue  levothyroxine  Hypokalemia -Improved.  Prior history of right lower extremity osteosarcoma status post amputation    DVT prophylaxis: Coumadin Code Status: Full Family Communication: None at bedside Disposition Plan: Status is: inpatient because: Of need for MRI of brain.  Still requiring adjustment of seizure medications.  LTM EEG in process.  Will need SNF placement.  Consultants: Neurology  Procedures: EEG  Antimicrobials: None   Subjective: Patient seen and examined at bedside.  Poor historian.  No fever, vomiting, abdominal pain reported.   Objective: Vitals:   02/16/21 1655 02/17/21 0119 02/17/21 0504 02/17/21 0746  BP: 124/68 105/60 110/69 103/62  Pulse: 89 71 82 79  Resp: 18 16 18 16   Temp: 98.5 F (36.9 C)  97.9 F (36.6 C) 97.9 F (36.6 C)  TempSrc: Oral  Oral Oral  SpO2: 98% 100% 100% 100%  Weight:      Height:       No intake or output data in the 24 hours ending 02/17/21 0800  Filed Weights   02/14/21 0800  Weight: 78.5 kg    Examination:  General exam: No acute distress.  Currently on room air.  More awake this morning and answers few questions.  Still slow to respond and poor historian.  No current seizures noted respiratory system: Bilateral decreased breath sounds at bases with scattered crackles  cardiovascular system: S1-S2 heard; currently rate controlled gastrointestinal system: Abdomen is distended slightly; soft and nontender.  Normal bowel sounds are heard Extremities:  right AKA present; mild left lower extremity edema present  Data Reviewed: I have personally reviewed following labs and imaging studies  CBC: Recent Labs  Lab 02/13/21 2212 02/13/21 2227 02/14/21 0240 02/15/21 0350 02/16/21 0457  WBC 5.0  --  4.4 3.8* 4.1  NEUTROABS 2.3  --   --  1.1*  --   HGB 11.8* 13.3 11.7* 11.9* 11.4*  HCT 37.1 39.0 36.9 36.9 35.0*  MCV 90.9  --  91.1 90.0 90.0  PLT 168  --  195 218 836    Basic Metabolic Panel: Recent Labs  Lab  02/13/21 2212 02/13/21 2227 02/14/21 0240 02/15/21 0350 02/16/21 0457 02/17/21 0021  NA 139 141 139 137 138 137  K 3.6 3.6 3.4* 3.3* 4.0 3.7  CL 102 102 104 103 104 103  CO2 30  --  27 28 25 28   GLUCOSE 97 92 105* 102* 99 109*  BUN 13 15 11 11 11 14   CREATININE 1.17* 1.00 0.99 1.10* 1.13* 1.28*  CALCIUM 9.5  --  9.0 8.9 9.0 9.6  MG  --   --  1.7 1.8 1.8  --     GFR: Estimated Creatinine Clearance: 53.1 mL/min (A) (by C-G formula based on SCr of 1.28 mg/dL (H)). Liver Function Tests: Recent Labs  Lab 02/13/21 2212 02/15/21 0350  AST 27 22  ALT 27 23  ALKPHOS 116 89  BILITOT 0.5 0.5  PROT 7.0 6.3*  ALBUMIN 3.8 3.3*    No results for input(s): LIPASE, AMYLASE in the last 168 hours. No results for input(s): AMMONIA in the last 168 hours. Coagulation Profile: Recent Labs  Lab 02/13/21 2212 02/14/21 0240 02/15/21 0350 02/16/21 0457 02/17/21 0021  INR 1.8* 1.7* 1.8* 2.3* 2.2*    Cardiac Enzymes: No results for input(s): CKTOTAL, CKMB, CKMBINDEX, TROPONINI in the last 168 hours. BNP (last 3 results) No results for input(s): PROBNP in the last 8760 hours. HbA1C: No results for input(s): HGBA1C in the last 72 hours. CBG: Recent Labs  Lab 02/14/21 2317 02/15/21 1725 02/16/21 0058 02/16/21 1729 02/16/21 2317  GLUCAP 110* 120* 125* 87 99    Lipid Profile: No results for input(s): CHOL, HDL, LDLCALC, TRIG, CHOLHDL, LDLDIRECT in the last 72 hours. Thyroid Function Tests: No results for input(s): TSH, T4TOTAL, FREET4, T3FREE, THYROIDAB in the last 72 hours. Anemia Panel: No results for input(s): VITAMINB12, FOLATE, FERRITIN, TIBC, IRON, RETICCTPCT in the last 72 hours. Sepsis Labs: Recent Labs  Lab 02/14/21 0240  PROCALCITON <0.10     Recent Results (from the past 240 hour(s))  Resp Panel by RT-PCR (Flu A&B, Covid) Nasopharyngeal Swab     Status: None   Collection Time: 02/13/21 11:24 PM   Specimen: Nasopharyngeal Swab; Nasopharyngeal(NP) swabs in vial  transport medium  Result Value Ref Range Status   SARS Coronavirus 2 by RT PCR NEGATIVE NEGATIVE Final    Comment: (NOTE) SARS-CoV-2 target nucleic acids are NOT DETECTED.  The SARS-CoV-2 RNA is generally detectable in upper respiratory specimens during the acute phase of infection. The lowest concentration of SARS-CoV-2 viral copies this assay can detect is 138 copies/mL. A negative result does not preclude SARS-Cov-2 infection and should not be used as the sole basis for treatment or other patient management decisions. A negative result may occur with  improper specimen collection/handling, submission of specimen other than nasopharyngeal swab, presence of viral mutation(s) within the areas targeted by this assay, and inadequate number of viral copies(<138 copies/mL). A negative result must be combined with clinical observations, patient history, and epidemiological information. The expected result is Negative.  Fact Sheet for Patients:  EntrepreneurPulse.com.au  Fact Sheet for Healthcare Providers:  IncredibleEmployment.be  This test is no t yet approved or cleared by the [place] and  has been authorized for detection and/or diagnosis of SARS-CoV-2 by FDA under an Emergency Use Authorization (EUA). This EUA will remain  in effect (meaning this test can be used) for the duration of the COVID-19 declaration under Section 564(b)(1) of the Act, 21 U.S.C.section 360bbb-3(b)(1), unless the authorization is terminated  or revoked sooner.       Influenza A by PCR NEGATIVE NEGATIVE Final   Influenza B by PCR NEGATIVE NEGATIVE Final    Comment: (NOTE) The Xpert Xpress SARS-CoV-2/FLU/RSV plus assay is intended as an aid in the diagnosis of influenza from Nasopharyngeal swab specimens and should not be used as a sole basis for treatment. Nasal washings and aspirates are unacceptable for Xpert Xpress SARS-CoV-2/FLU/RSV testing.  Fact  Sheet for Patients: EntrepreneurPulse.com.au  Fact Sheet for Healthcare Providers: IncredibleEmployment.be  This test is not yet approved or cleared by the [place] FDA and has been authorized for detection and/or diagnosis of SARS-CoV-2 by FDA under an Emergency Use Authorization (EUA). This EUA will remain in effect (meaning this test can be used) for the duration of the COVID-19 declaration under Section 564(b)(1) of the Act, 21 U.S.C. section 360bbb-3(b)(1), unless the authorization is terminated or revoked.  Performed at [facility], [place] [address] [place], [place] [address]           Radiology Studies: No results found.      Scheduled Meds:  cloBAZam  10 mg Oral QHS   lacosamide  200 mg Oral BID   lamoTRIgine  350 mg Oral BID   levETIRAcetam  1,500 mg Oral q AM   levETIRAcetam  2,000 mg Oral QHS   levothyroxine  50 mcg Oral Q0600   pantoprazole  40 mg Oral QHS   topiramate  25 mg Oral BID   venlafaxine XR  150 mg Oral Q breakfast   Warfarin - Pharmacist Dosing Inpatient   Does not apply q1600   Continuous Infusions:  levETIRAcetam Stopped (02/14/21 0010)   levETIRAcetam Stopped (02/14/21 0741)          Aline August, MD [facility] 02/17/2021, 8:00 AM

## 2021-02-17 NOTE — Progress Notes (Signed)
°   02/17/21 2309  What Happened  Was fall witnessed? No  Was patient injured? Unsure  Patient found on floor  Found by Staff-comment Bonnita Nasuti R)  Stated prior activity other (comment) (unsure)  Follow Up  MD notified Sheran Luz MD  Time MD notified [phone]  Family notified Yes - comment Chiyoko Torrico)  Time family notified 2351  Additional tests Yes-comment (Stat Head CT)  Progress note created (see row info) Yes  Adult Fall Risk Assessment  Risk Factor Category (scoring not indicated) High fall risk per protocol (document High fall risk)  Patient Fall Risk Level High fall risk  Adult Fall Risk Interventions  Required Bundle Interventions *See Row Information* High fall risk - low, moderate, and high requirements implemented  Additional Interventions Use of appropriate toileting equipment (bedpan, BSC, etc.)  Screening for Fall Injury Risk (To be completed on HIGH fall risk patients) - Assessing Need for Floor Mats  Risk For Fall Injury- Criteria for Floor Mats Admitted as a result of a fall  Will Implement Floor Mats Yes  Vitals  BP 109/64  MAP (mmHg) 77  BP Location Left Arm  BP Method Automatic  Pulse Rate (!) 114  Resp (!) 24  Oxygen Therapy  SpO2 100 %  O2 Device Room Air  Pain Assessment  Pain Scale 0-10  Pain Score 0  Neurological  Level of Consciousness Alert  Orientation Level Oriented to person;Disoriented to place;Disoriented to time;Disoriented to situation  Armed forces training and education officer Clear (soft)  R Pupil Size (mm) 3  R Pupil Shape Round  R Pupil Reaction Sluggish  L Pupil Size (mm) 5  L Pupil Shape Round  L Pupil Reaction Sluggish  R Hand Grip Present  L Hand Grip Present  Glasgow Coma Scale  Eye Opening 4  Best Verbal Response (NON-intubated) 4  Best Motor Response 6  Glasgow Coma Scale Score 14  Musculoskeletal  Musculoskeletal (WDL) X  Musculoskeletal Details  RUE Full movement  LUE Full movement  RLE Full movement  LLE Full movement

## 2021-02-17 NOTE — Progress Notes (Addendum)
Per RN, the patient slid off the bed, unwitnessed, but per RN outside the door, it  sounded like she hit her head. Prior to fall she knew name dob and place. now she does not know where she is. Primary team is unsure if shemaybe had a seizure and fell. She is on LTM EEG.   EEG tracings reviewed from 2255 to 2315. Again noted are sharp waves in the right frontal region on EEG, in addition to generalized continuous slowing. The right frontal abnormality  did have somewhat of a rhythmic quality to it, but uncertain if this is artifact.   A/R: 55 year old female who presented with increasing somnolence and a seizure on the day PTA. She is status post a fall out of her hospital bed this evening.  - If she has a witnessed spell, administer 2 mg IV Ativan x 1 and call Neurology.  - Continue her current anticonvulsant regimen.  - CT head is pending    Addendum: CT head:  1. Stable postsurgical changes from right frontotemporal craniotomy and underlying right-sided encephalomalacia. 2. No acute intracranial process.  Electronically signed: Dr. Kerney Elbe

## 2021-02-18 ENCOUNTER — Ambulatory Visit: Payer: Medicare Other | Admitting: Neurology

## 2021-02-18 ENCOUNTER — Inpatient Hospital Stay (HOSPITAL_COMMUNITY): Payer: Medicare Other

## 2021-02-18 DIAGNOSIS — G40919 Epilepsy, unspecified, intractable, without status epilepticus: Secondary | ICD-10-CM | POA: Diagnosis not present

## 2021-02-18 DIAGNOSIS — R569 Unspecified convulsions: Secondary | ICD-10-CM | POA: Diagnosis not present

## 2021-02-18 LAB — GLUCOSE, CAPILLARY
Glucose-Capillary: 130 mg/dL — ABNORMAL HIGH (ref 70–99)
Glucose-Capillary: 83 mg/dL (ref 70–99)

## 2021-02-18 LAB — PROTIME-INR
INR: 2.6 — ABNORMAL HIGH (ref 0.8–1.2)
Prothrombin Time: 27.8 seconds — ABNORMAL HIGH (ref 11.4–15.2)

## 2021-02-18 MED ORDER — WARFARIN SODIUM 7.5 MG PO TABS
7.5000 mg | ORAL_TABLET | Freq: Once | ORAL | Status: AC
  Administered 2021-02-18: 17:00:00 7.5 mg via ORAL
  Filled 2021-02-18: qty 1

## 2021-02-18 MED ORDER — CLONAZEPAM 0.25 MG PO TBDP
1.0000 mg | ORAL_TABLET | Freq: Once | ORAL | Status: AC
  Administered 2021-02-18: 14:00:00 1 mg via ORAL
  Filled 2021-02-18: qty 4

## 2021-02-18 NOTE — Progress Notes (Signed)
LTM EEG disconnected - no skin breakdown at unhook.  

## 2021-02-18 NOTE — Progress Notes (Signed)
Physical Therapy Treatment Patient Details Name: Cassandra Andrade MRN: [id number] DOB: [birth year] Today's Date: 02/18/2021   History of Present Illness This 55 y.o. female admitted 02/13/21 with increasing somnolence over previous 2 weeks and worsensing of Lt sided weakness.  CT of head negative for acute abnormality, CTA negative for LVO.  MRI negative for acute abnormality.  EEG performed consistent with known hx of epilipsy.  Pt with Refractory epilepsy with vagal nerve stimulator with increased frequency of seizure  PMH includes: seizure disorder with 2-3 sz/week, h/o stroke related to prior aneurysm rupsture, h/o DVT, h/o Rt LE osteosarcoma, s/p Rt AKA    PT Comments    Pt progressing gradually but still requiring min-mod A and mod cues for transfer to EOB and standing for limited time.  Required increased time for all.  She has decreased safety awareness and awareness of deficits/abilities to manage at home at this time.  Pt reports typically scoots on floor for transfers and can get up/down on her own.  She is below this level, continue to recommend SNF.     Recommendations for follow up therapy are one component of a multi-disciplinary discharge planning process, led by the attending physician.  Recommendations may be updated based on patient status, additional functional criteria and insurance authorization.  Follow Up Recommendations  Skilled nursing-short term rehab (<3 hours/day)     Assistance Recommended at Discharge Frequent or constant Supervision/Assistance  Equipment Recommendations  None recommended by PT    Recommendations for Other Services       Precautions / Restrictions Precautions Precautions: Fall;Other (comment) Precaution Comments: seziures; prior R AKA     Mobility  Bed Mobility Overal bed mobility: Needs Assistance Bed Mobility: Supine to Sit;Sit to Supine     Supine to sit: Min assist Sit to supine: Min assist   General bed mobility comments: Mod  cues for sequencing wtih significantly increased time and use of bed rails    Transfers Overall transfer level: Needs assistance Equipment used: Rolling walker (2 wheels) Transfers: Sit to/from Stand Sit to Stand: Mod assist;From elevated surface          Lateral/Scoot Transfers: Mod assist General transfer comment: increased time to rise with mod A and cues for hand placement - unable to transition hand from bed rail to RW.  Pt did not lateral scoot to chair but did lateral scoot up EOB ~2 feet with mod cues and min-mod A    Ambulation/Gait               General Gait Details: unable to hop; prosthetic not present   Stairs             Wheelchair Mobility    Modified Rankin (Stroke Patients Only) Modified Rankin (Stroke Patients Only) Pre-Morbid Rankin Score: Moderately severe disability Modified Rankin: Severe disability     Balance Overall balance assessment: Needs assistance Sitting-balance support: Single extremity supported Sitting balance-Leahy Scale: Poor Sitting balance - Comments: Requiring UE support and close guarding   Standing balance support: Reliant on assistive device for balance;Bilateral upper extremity supported Standing balance-Leahy Scale: Poor Standing balance comment: UE support and min A for static standing                            Cognition Arousal/Alertness: Awake/alert Behavior During Therapy: Flat affect Overall Cognitive Status: No family/caregiver present to determine baseline cognitive functioning  General Comments: Pt very soft spoken, requireing multimodal cues for transfers and safety, decreased awareness of safety and current deficits        Exercises      General Comments General comments (skin integrity, edema, etc.): VSS on RA.  Pt confirming that at home she is able to scoot on floor for mobility and uses w/c when out.      Pertinent Vitals/Pain Pain  Assessment: No/denies pain    Home Living                          Prior Function            PT Goals (current goals can now be found in the care plan section) Progress towards PT goals: Progressing toward goals    Frequency    Min 3X/week      PT Plan Current plan remains appropriate;Frequency needs to be updated    Co-evaluation              AM-PAC PT "6 Clicks" Mobility   Outcome Measure  Help needed turning from your back to your side while in a flat bed without using bedrails?: A Lot Help needed moving from lying on your back to sitting on the side of a flat bed without using bedrails?: A Lot Help needed moving to and from a bed to a chair (including a wheelchair)?: Total Help needed standing up from a chair using your arms (e.g., wheelchair or bedside chair)?: A Lot Help needed to walk in hospital room?: Total Help needed climbing 3-5 steps with a railing? : Total 6 Click Score: 9    End of Session Equipment Utilized During Treatment: Gait belt Activity Tolerance: Patient tolerated treatment well Patient left: in bed;with bed alarm set;with call bell/phone within reach;with nursing/sitter in room Nurse Communication: Mobility status PT Visit Diagnosis: Unsteadiness on feet (R26.81);Muscle weakness (generalized) (M62.81);History of falling (Z91.81);Difficulty in walking, not elsewhere classified (R26.2);Other symptoms and signs involving the nervous system (R29.898)     Time: 7078-6754 PT Time Calculation (min) (ACUTE ONLY): 23 min  Charges:  $Therapeutic Activity: 23-37 mins                     Abran Richard, PT Acute Rehab Services Pager [phone] [facility] [place] 02/18/2021, 5:39 PM

## 2021-02-18 NOTE — Plan of Care (Signed)
°  Problem: Clinical Measurements: Goal: Ability to maintain clinical measurements within normal limits will improve Outcome: Progressing Goal: Will remain free from infection Outcome: Progressing Goal: Diagnostic test results will improve Outcome: Progressing Goal: Respiratory complications will improve Outcome: Progressing Goal: Cardiovascular complication will be avoided Outcome: Progressing   Problem: Activity: Goal: Risk for activity intolerance will decrease Outcome: Progressing   Problem: Nutrition: Goal: Adequate nutrition will be maintained Outcome: Progressing   Problem: Coping: Goal: Level of anxiety will decrease Outcome: Progressing   Problem: Elimination: Goal: Will not experience complications related to urinary retention Outcome: Progressing   Problem: Pain Managment: Goal: General experience of comfort will improve Outcome: Progressing   Problem: Safety: Goal: Ability to remain free from injury will improve Outcome: Progressing   Problem: Skin Integrity: Goal: Risk for impaired skin integrity will decrease Outcome: Progressing   Problem: Coping: Goal: Ability to identify appropriate support needs will improve Outcome: Progressing   Problem: Health Behavior/Discharge Planning: Goal: Compliance with prescribed medication regimen will improve Outcome: Progressing   Problem: Medication: Goal: Risk for medication side effects will decrease Outcome: Progressing   Problem: Clinical Measurements: Goal: Diagnostic test results will improve Outcome: Progressing   Problem: Self-Concept: Goal: Level of anxiety will decrease Outcome: Progressing Goal: Ability to verbalize feelings about condition will improve Outcome: Progressing

## 2021-02-18 NOTE — Care Plan (Signed)
VNS was interrogated by me at 1000 on 02/18/2021 as follows    Normal Magnet  Output 51mA 2.55mA  Frequency 30Hz    Pulse Width 250uSec 250uSec  On time 21 sec 60 sec  Off time 1.1 min   Duty cycle 29%      I turned off VNS and re-interrogated with following settings   Normal Magnet  Output 63mA 58mA  Frequency 30Hz    Pulse Width 250uSec 250uSec  On time 21 sec 60 sec  Off time 1.1 min   Duty cycle 29%      Patient's RN and mother were also notified about VNS bring off.  Please notify me after mri to turn VNS back on.  Altair Appenzeller [name]

## 2021-02-18 NOTE — Progress Notes (Signed)
PROGRESS NOTE  Cassandra Andrade  DOB: Dec 19, 1965  PCP: Berkley Harvey, NP WNI:[id number]  DOA: 02/13/2021  LOS: 2 days  Hospital Day: 6  Chief Complaint  Patient presents with   Code Stroke    Brief narrative: Cassandra Andrade is a 55 y.o. 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 Patient was brought to the ED on 12/23 after she was noted to be increasingly somnolent over the last course of 2 weeks, acutely worse on preceding 2 days associated with left-sided weakness more than usual.   In the ED, code stroke was initially called and patient was seen by neurology.   CT of the head and CT angiogram of head and neck were unremarkable.  COVID testing was negative. Admitted to hospitalist service  Subjective: Patient was seen and examined this morning.  Pleasant middle-aged African-American female.  Propped up in bed.  Not in distress.  Able to answer simple orientation questions.  No family at bedside.  Assessment/Plan: Acute encephalopathy Left hemiparesis, Dysarthria -Patient presented with increased sleepiness, left hemiparesis, dysarthria.  Initial symptoms are concerning for stroke but imagings were negative.  Per neurology, patient's worsening mental status, hemiparesis and dysarthria were likely due to increased frequency of seizures. -Imagings negative as above.  COVID test negative.  Urinalysis unremarkable. -No evidence of infection. -EEG showed focal slowing over the right frontotemporal region but no seizures identified. -MRI brain without contrast 12/28 did not show any acute intracranial abnormality but showed chronic right MCA encephalomalacia.  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 -Continue seizure precautions.  History of DVT and lupus anticoagulant -INR 2.6 today.  Pharmacy following.  Monitor daily INR.  Continue Coumadin dosed as per pharmacy. Recent Labs  Lab 02/14/21 0240 02/15/21 0350 02/16/21 0457 02/17/21 0021 02/18/21 0237  INR 1.7* 1.8* 2.3* 2.2* 2.6*   Hypothyroidism -Continue levothyroxine  Hypokalemia -Improved with replacement Recent Labs  Lab 02/13/21 2227 02/14/21 0240 02/15/21 0350 02/16/21 0457 02/17/21 0021  K 3.6 3.4* 3.3* 4.0 3.7  MG  --  1.7 1.8 1.8  --    Prior history of right lower extremity osteosarcoma status post amputation   Impaired mobility -SNF recommended by PT   Mobility: SNF Living condition: Was living at home Goals of care:   Code Status: Full Code  Nutritional status: Body mass index is 27.92 kg/m.      Diet:  Diet Order             Diet Heart Room service appropriate? Yes; Fluid consistency: Thin  Diet effective now                  DVT prophylaxis:   warfarin (COUMADIN) tablet 7.5 mg   Antimicrobials: None Fluid: None Consultants: Neurology Family Communication: None at bedside  Status is: Inpatient Continue in-hospital care because: Needs titration of seizure meds Level of care: Telemetry Medical   Dispo: The patient is from: Home              Anticipated d/c is to: SNF recommended by PT              Patient currently is not medically stable to d/c.   Difficult to place  patient No     Infusions:   levETIRAcetam Stopped (02/14/21 0010)   levETIRAcetam Stopped (02/14/21 0741)    Scheduled Meds:  cloBAZam  10 mg Oral QHS   lacosamide  200 mg Oral BID   lamoTRIgine  350 mg Oral BID   levETIRAcetam  1,500 mg Oral q AM   levETIRAcetam  2,000 mg Oral QHS   levothyroxine  50 mcg Oral Q0600   pantoprazole  40 mg Oral QHS   topiramate  25 mg Oral BID   venlafaxine XR  150 mg Oral Q breakfast    warfarin  7.5 mg Oral ONCE-1600   Warfarin - Pharmacist Dosing Inpatient   Does not apply q1600    PRN meds: acetaminophen **OR** acetaminophen, clonazePAM, LORazepam, ondansetron (ZOFRAN) IV   Antimicrobials: Anti-infectives (From admission, onward)    None       Objective: Vitals:   02/18/21 1208 02/18/21 1703  BP: (!) 89/58 105/61  Pulse: 90 93  Resp: 16 16  Temp: 97.6 F (36.4 C) 97.9 F (36.6 C)  SpO2: 100% 100%    Intake/Output Summary (Last 24 hours) at 02/18/2021 1716 Last data filed at 02/18/2021 0600 Gross per 24 hour  Intake 240 ml  Output 450 ml  Net -210 ml   Filed Weights   02/14/21 0800  Weight: 78.5 kg   Weight change:  Body mass index is 27.92 kg/m.   Physical Exam: General exam: Pleasant, middle-aged African-American female.  Not in physical distress Skin: No rashes, lesions or ulcers. HEENT: Atraumatic, normocephalic, no obvious bleeding Lungs: Clear to auscultate bilaterally CVS: Regular rate and rhythm, no murmur GI/Abd soft, nontender, nondistended, bowel sound present CNS: Alert, awake, oriented to place and person, oriented to time with difficulty Psychiatry: Sad affect Extremities: No pedal edema, no calf tenderness.  Right leg amputation status  Data Review: I have personally reviewed the laboratory data and studies available.  F/u labs ordered Unresulted Labs (From admission, onward)     Start     Ordered   02/19/21 0500  CBC with Differential/Platelet  Tomorrow morning,   R       Question:  Specimen collection method  Answer:  Lab=Lab collect   02/18/21 1244   02/19/21 8768  Basic metabolic panel  Tomorrow morning,   R       Question:  Specimen collection method  Answer:  Lab=Lab collect   02/18/21 1244   02/14/21 0500  Protime-INR  Daily,   R      02/14/21 0210            Signed, Terrilee Croak, MD [facility] 02/18/2021

## 2021-02-18 NOTE — Progress Notes (Signed)
ANTICOAGULATION CONSULT NOTE - Follow Up Consult  Pharmacy Consult for Warfarin Indication:  history of VTE  Allergies  Allergen Reactions   Vicodin [Hydrocodone-Acetaminophen] Nausea And Vomiting   Morphine And Related Rash    Allergic reaction only in iv form    Patient Measurements: Height: 5\' 6"  (167.6 cm) Weight: 78.5 kg (173 lb) IBW/kg (Calculated) : 59.3 kg  Vital Signs: Temp: 97.7 F (36.5 C) (12/28 0708) Temp Source: Oral (12/28 0708) BP: 107/58 (12/28 0708) Pulse Rate: 85 (12/28 0708)  Labs: Recent Labs    02/16/21 0457 02/17/21 0021 02/18/21 0237  HGB 11.4*  --   --   HCT 35.0*  --   --   PLT 187  --   --   LABPROT 25.1* 24.4* 27.8*  INR 2.3* 2.2* 2.6*  CREATININE 1.13* 1.28*  --      Estimated Creatinine Clearance: 53.1 mL/min (A) (by C-G formula based on SCr of 1.28 mg/dL (H)).   Assessment: 55 yo female presents as code stroke, neuro suspects recrudescence of prior stroke symptoms in the setting of a potential infectious or metabolic abnormality. INR 1.8 upon admission. 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 Pharmacy is consulted to dose heparin.  Per 12/23 appointment, the patient was supposed to HOLD warfarin on 12/21 and 12/22 then take 5 mg on 12/23, 7.5 mg on 12/24 and 12/25, 5 mg on 12/26, and return for an INR check on 12/27. The patient presented to the hospital prior to starting this regimen.  Warfarin PTA Regimen: 10 mg daily except 7.5 mg Sun/Wed  INR 2.6   Goal of Therapy:  INR 2-3 Monitor platelets by anticoagulation protocol: Yes   Plan:  Give warfarin PO 7.5 mg x 1 dose tonight (normal PTA dose) Obtain a daily PT/INR and Q72H CBC Monitor for signs and symptoms of bleeding  Thank you Vaughan Basta BS, PharmD, BCPS Clinical Pharmacist 02/18/2021 10:53 AM  Please check [email or web address] for unit-specific pharmacy phone numbers.

## 2021-02-18 NOTE — Progress Notes (Signed)
Patient back from MRI, notified MD so that VNS can be turned back on. No new orders at this time.

## 2021-02-18 NOTE — Procedures (Addendum)
Patient Name: Cassandra Andrade  MRN: [id number]  Epilepsy Attending: Lora Havens  Referring Physician/Provider: Gardiner Barefoot, NP Duration: 02/17/2021 1617 to 02/18/2021 1105   Patient history: 55 y.o. 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 EEG to evaluate for seizure   Level of alertness: Awake, asleep   AEDs during EEG study: LEV, LTG, LCM, TPM, Onfi   Technical aspects: This EEG study was done with scalp electrodes positioned according to the 10-20 International system of electrode placement. Electrical activity was acquired at a sampling rate of 500Hz  and reviewed with a high frequency filter of 70Hz  and a low frequency filter of 1Hz . EEG data were recorded continuously and digitally stored.    Description: The posterior dominant rhythm consists of 9-10 Hz activity of moderate voltage (25-35 uV) seen predominantly in posterior head regions, symmetric and reactive to eye opening and eye closing. Sleep was characterized by vertex waves, sleep spindles (12 to 14 Hz), maximal frontocentral region.     EEG showed continuous sharply contoured 3 to 6 Hz theta-delta slowing admixed with sharp waves in right frontal region, maximal F8 consistent with breach artifact.    Hyperventilation and photic stimulation were not performed.      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 There is also cortical dysfunction in right frontal consistent with prior craniotomy. No seizures were seen throughout the recording.   Jerrelle Michelsen [name]

## 2021-02-18 NOTE — Progress Notes (Signed)
Pt had seizure Episode lasting about 5 minutes Charge RN at bedside, Attending and Neurology notified. Dr. Hortense Ramal otw to bedside orders for Sublingual Clonazepam received.  Pt waiting on MRI

## 2021-02-18 NOTE — Progress Notes (Signed)
Subjective: No clinical seizures overnight.  Also spoke with patient's mother on phone who states patient speech is improving.  ROS: negative except above  Examination  Vital signs in last 24 hours: Temp:  [97.7 F (36.5 C)-98.3 F (36.8 C)] 97.7 F (36.5 C) (12/28 0708) Pulse Rate:  [79-114] 85 (12/28 0708) Resp:  [15-24] 15 (12/28 0708) BP: (96-125)/(54-67) 107/58 (12/28 0708) SpO2:  [97 %-100 %] 100 % (12/28 0708)  General: lying in bed, NAD CVS: pulse-normal rate and rhythm RS: breathing comfortably, CTAB Extremities: warm, no edema  Neuro: MS: Alert, oriented, follows commands CN: pupils equal and reactive,  EOMI, face symmetric, tongue midline, normal sensation over face,RUE, 4+/5 in LUE, 5/5 in LLE, RLE AKA   Basic Metabolic Panel: Recent Labs  Lab 02/13/21 2212 02/13/21 2227 02/14/21 0240 02/15/21 0350 02/16/21 0457 02/17/21 0021  NA 139 141 139 137 138 137  K 3.6 3.6 3.4* 3.3* 4.0 3.7  CL 102 102 104 103 104 103  CO2 30  --  27 28 25 28   GLUCOSE 97 92 105* 102* 99 109*  BUN 13 15 11 11 11 14   CREATININE 1.17* 1.00 0.99 1.10* 1.13* 1.28*  CALCIUM 9.5  --  9.0 8.9 9.0 9.6  MG  --   --  1.7 1.8 1.8  --     CBC: Recent Labs  Lab 02/13/21 2212 02/13/21 2227 02/14/21 0240 02/15/21 0350 02/16/21 0457  WBC 5.0  --  4.4 3.8* 4.1  NEUTROABS 2.3  --   --  1.1*  --   HGB 11.8* 13.3 11.7* 11.9* 11.4*  HCT 37.1 39.0 36.9 36.9 35.0*  MCV 90.9  --  91.1 90.0 90.0  PLT 168  --  195 218 187     Coagulation Studies: Recent Labs    02/16/21 0457 02/17/21 0021 02/18/21 0237  LABPROT 25.1* 24.4* 27.8*  INR 2.3* 2.2* 2.6*    Imaging MRI brain ordered and pending  ASSESSMENT AND PLAN: 59 old female with history of medically refractory epilepsy status post VNS placement who presented with worsening mentation and left-sided weakness as well as dysarthria.  Acute encephalopathy, resolved Left hemiparesis, improving Dysarthria, improving Medically  refractory epilepsy with increased frequency of seizure -No seizures on LTM EEG -Worsening mentation left hemiparesis and dysarthria were likely due to increased frequency of seizures.  No evidence of infection  Recommendations -We will obtain MRI brain without contrast to look for any acute abnormality.  I have turned off VNS for MRI.  We will turn it back on after MRI -Continue Keppra 1500 mg every morning and 2000 mg nightly, lamotrigine 350 mg twice daily, lacosamide 200 mg twice daily, clobazam 10 mg nightly and Topamax 25 mg twice daily -It appears that lamotrigine was increased from 300 mg twice daily, Onfi was increased from 5 mg nightly as well as Topamax was added during this hospital stay but I do not see any previous neurology notes other than the care plan yesterday.  Will clarify with neuro hospitalist about these medications -Continue seizure precautions -Management of rest of comorbidities per primary team -Called and discussed plan with patient's mother on phone.  Also requested if she can come see the patient in the middle to make sure patient is improving.  I have spent a total of  37  minutes with the patient reviewing hospital notes,  test results, labs and examining the patient as well as establishing an assessment and plan that was discussed personally with the patient.  >  50% of time was spent in direct patient care.     Zeb Comfort Epilepsy [facility] For questions after 5pm please refer to AMION to reach the Neurologist on call

## 2021-02-18 NOTE — Progress Notes (Signed)
Spoke with Dr. Cyd Silence and then to Dr. Cheral Marker regarding pt's vagal nerve stimulator still being off after MRI.  Dr. Cheral Marker stated he was not able to turn it back on and to f/u with Dr. Hortense Ramal in the AM so she could.   Ayesha Mohair BSN RN CMSRN

## 2021-02-19 DIAGNOSIS — R569 Unspecified convulsions: Secondary | ICD-10-CM | POA: Diagnosis not present

## 2021-02-19 LAB — BASIC METABOLIC PANEL
Anion gap: 9 (ref 5–15)
BUN: 13 mg/dL (ref 6–20)
CO2: 26 mmol/L (ref 22–32)
Calcium: 9.3 mg/dL (ref 8.9–10.3)
Chloride: 103 mmol/L (ref 98–111)
Creatinine, Ser: 1.11 mg/dL — ABNORMAL HIGH (ref 0.44–1.00)
GFR, Estimated: 59 mL/min — ABNORMAL LOW (ref >60)
Glucose, Bld: 102 mg/dL — ABNORMAL HIGH (ref 70–99)
Potassium: 4.1 mmol/L (ref 3.5–5.1)
Sodium: 138 mmol/L (ref 135–145)

## 2021-02-19 LAB — CBC WITH DIFFERENTIAL/PLATELET
Abs Immature Granulocytes: 0.01 10*3/uL (ref 0.00–0.07)
Basophils Absolute: 0 10*3/uL (ref 0.0–0.1)
Basophils Relative: 0 %
Eosinophils Absolute: 0.1 10*3/uL (ref 0.0–0.5)
Eosinophils Relative: 2 %
HCT: 34.5 % — ABNORMAL LOW (ref 36.0–46.0)
Hemoglobin: 11.1 g/dL — ABNORMAL LOW (ref 12.0–15.0)
Immature Granulocytes: 0 %
Lymphocytes Relative: 51 %
Lymphs Abs: 2.7 10*3/uL (ref 0.7–4.0)
MCH: 29 pg (ref 26.0–34.0)
MCHC: 32.2 g/dL (ref 30.0–36.0)
MCV: 90.1 fL (ref 80.0–100.0)
Monocytes Absolute: 0.5 10*3/uL (ref 0.1–1.0)
Monocytes Relative: 9 %
Neutro Abs: 2 10*3/uL (ref 1.7–7.7)
Neutrophils Relative %: 38 %
Platelets: 202 10*3/uL (ref 150–400)
RBC: 3.83 MIL/uL — ABNORMAL LOW (ref 3.87–5.11)
RDW: 13.1 % (ref 11.5–15.5)
WBC: 5.3 10*3/uL (ref 4.0–10.5)
nRBC: 0 % (ref 0.0–0.2)

## 2021-02-19 LAB — GLUCOSE, CAPILLARY
Glucose-Capillary: 107 mg/dL — ABNORMAL HIGH (ref 70–99)
Glucose-Capillary: 114 mg/dL — ABNORMAL HIGH (ref 70–99)

## 2021-02-19 LAB — PROTIME-INR
INR: 3.1 — ABNORMAL HIGH (ref 0.8–1.2)
Prothrombin Time: 31.7 seconds — ABNORMAL HIGH (ref 11.4–15.2)

## 2021-02-19 MED ORDER — LAMOTRIGINE 25 MG PO TABS: ORAL_TABLET | ORAL | 0 refills | Status: DC

## 2021-02-19 MED ORDER — CLOBAZAM 10 MG PO TABS
10.0000 mg | ORAL_TABLET | Freq: Every day | ORAL | Status: DC
Start: 2021-02-19 — End: 2021-04-24

## 2021-02-19 MED ORDER — LEVETIRACETAM 500 MG PO TABS: ORAL_TABLET | ORAL | 0 refills | Status: DC

## 2021-02-19 MED ORDER — LAMOTRIGINE 100 MG PO TABS: ORAL_TABLET | ORAL | 2 refills | Status: DC

## 2021-02-19 MED ORDER — WARFARIN SODIUM 5 MG PO TABS: 5.0000 mg | ORAL_TABLET | Freq: Once | ORAL | Status: DC

## 2021-02-19 NOTE — Care Management Important Message (Signed)
Important Message  Patient Details  Name: Cassandra Andrade MRN: [id number] Date of Birth: Dec 07, 1965   Medicare Important Message Given:  Yes     Avira Tillison Montine Circle 02/19/2021, 3:32 PM

## 2021-02-19 NOTE — Progress Notes (Signed)
Subjective: Had 1 seizure yesterday afternoon.  No further seizures overnight.  ROS: negative except above  Examination  Vital signs in last 24 hours: Temp:  [97.6 F (36.4 C)-98.4 F (36.9 C)] 98 F (36.7 C) (12/29 0741) Pulse Rate:  [73-93] 91 (12/29 0741) Resp:  [11-16] 13 (12/29 0741) BP: (89-115)/(49-69) 100/62 (12/29 0741) SpO2:  [96 %-100 %] 98 % (12/29 0741)  General: lying in bed, NAD CVS: pulse-normal rate and rhythm RS: breathing comfortably, CTAB Extremities: warm, no edema   Neuro: MS: Alert, oriented, follows commands CN: pupils equal and reactive,  EOMI, face symmetric, tongue midline, normal sensation over face,RUE, 4+/5 in LUE, 5/5 in LLE, RLE AKA      Basic Metabolic Panel: Recent Labs  Lab 02/14/21 0240 02/15/21 0350 02/16/21 0457 02/17/21 0021 02/19/21 0244  NA 139 137 138 137 138  K 3.4* 3.3* 4.0 3.7 4.1  CL 104 103 104 103 103  CO2 27 28 25 28 26   GLUCOSE 105* 102* 99 109* 102*  BUN 11 11 11 14 13   CREATININE 0.99 1.10* 1.13* 1.28* 1.11*  CALCIUM 9.0 8.9 9.0 9.6 9.3  MG 1.7 1.8 1.8  --   --     CBC: Recent Labs  Lab 02/13/21 2212 02/13/21 2227 02/14/21 0240 02/15/21 0350 02/16/21 0457 02/19/21 0244  WBC 5.0  --  4.4 3.8* 4.1 5.3  NEUTROABS 2.3  --   --  1.1*  --  2.0  HGB 11.8* 13.3 11.7* 11.9* 11.4* 11.1*  HCT 37.1 39.0 36.9 36.9 35.0* 34.5*  MCV 90.9  --  91.1 90.0 90.0 90.1  PLT 168  --  195 218 187 202     Coagulation Studies: Recent Labs    02/17/21 0021 02/18/21 0237 02/19/21 0244  LABPROT 24.4* 27.8* 31.7*  INR 2.2* 2.6* 3.1*    Imaging MRI brain without contrast 02/18/2021:No acute intracranial abnormality or significant interval change. Chronic right MCA encephalomalacia.   ASSESSMENT AND PLAN: 4 old female with history of medically refractory epilepsy status post VNS placement who presented with worsening mentation and left-sided weakness as well as dysarthria.   Acute encephalopathy, resolved Left  hemiparesis, improving Dysarthria, improving Medically refractory epilepsy with increased frequency of seizure -Worsening mentation left hemiparesis and dysarthria were likely due to increased frequency of seizures.  No evidence of infection   Recommendations -Continue Keppra 1500 mg every morning and 2000 mg nightly, lamotrigine 350 mg twice daily, lacosamide 200 mg twice daily, clobazam 10 mg nightly -As needed Klonopin for breakthrough seizures -VNS was interrogated, no settings were changed -Continue seizure precautions -Management of rest of comorbidities per primary team -Called and discussed plan with patient's mother on phone.  Discussed MRI findings as well as medication changes.  Patient's mother seems hesitant about increasing Onfi but is willing to do it because patient has done well on the medicine while in the hospital. -I will also message patient's primary neurologist Dr. April Manson about her recent hospital stay. -Recommend follow-up with neurology (Dr. April Manson at [facility]) in 4 to 6 weeks  Seizure precautions: Per [facility] statutes, patients with seizures are not allowed to drive until they have been seizure-free for six months and cleared by a physician    Use caution when using heavy equipment or power tools. Avoid working on ladders or at heights. Take showers instead of baths. Ensure the water temperature is not too high on the home water heater. Do not go swimming alone. Do not lock yourself in a  room alone (i.e. bathroom). When caring for infants or small children, sit down when holding, feeding, or changing them to minimize risk of injury to the child in the event you have a seizure. Maintain good sleep hygiene. Avoid alcohol.    If patient has another seizure, call 911 and bring them back to the ED if: A.  The seizure lasts longer than 5 minutes.      B.  The patient doesn't wake shortly after the seizure or has new problems such as difficulty seeing, speaking or  moving following the seizure C.  The patient was injured during the seizure D.  The patient has a temperature over 102 F (39C) E.  The patient vomited during the seizure and now is having trouble breathing    During the Seizure   - First, ensure adequate ventilation and place patients on the floor on their left side  Loosen clothing around the neck and ensure the airway is patent. If the patient is clenching the teeth, do not force the mouth open with any object as this can cause severe damage - Remove all items from the surrounding that can be hazardous. The patient may be oblivious to what's happening and may not even know what he or she is doing. If the patient is confused and wandering, either gently guide him/her away and block access to outside areas - Reassure the individual and be comforting - Call 911. In most cases, the seizure ends before EMS arrives. However, there are cases when seizures may last over 3 to 5 minutes. Or the individual may have developed breathing difficulties or severe injuries. If a pregnant patient or a person with diabetes develops a seizure, it is prudent to call an ambulance. - Finally, if the patient does not regain full consciousness, then call EMS. Most patients will remain confused for about 45 to 90 minutes after a seizure, so you must use judgment in calling for help. - Avoid restraints but make sure the patient is in a bed with padded side rails - Place the individual in a lateral position with the neck slightly flexed; this will help the saliva drain from the mouth and prevent the tongue from falling backward - Remove all nearby furniture and other hazards from the area - Provide verbal assurance as the individual is regaining consciousness - Provide the patient with privacy if possible - Call for help and start treatment as ordered by the caregiver    After the Seizure (Postictal Stage)   After a seizure, most patients experience confusion, fatigue,  muscle pain and/or a headache. Thus, one should permit the individual to sleep. For the next few days, reassurance is essential. Being calm and helping reorient the person is also of importance.   Most seizures are painless and end spontaneously. Seizures are not harmful to others but can lead to complications such as stress on the lungs, brain and the heart. Individuals with prior lung problems may develop labored breathing and respiratory distress.   I have spent a total of  40 minutes with the patient reviewing hospital notes,  test results, labs and examining the patient as well as establishing an assessment and plan that was discussed personally with the patient.  > 50% of time was spent in direct patient care.    Zeb Comfort Epilepsy [facility] For questions after 5pm please refer to AMION to reach the Neurologist on call

## 2021-02-19 NOTE — TOC Transition Note (Signed)
Transition of Care [facility]) - CM/SW Discharge Note   Patient Details  Name: Cassandra Andrade MRN: [id number] Date of Birth: 1965-07-13  [facility] CM/SW Contact:  Pollie Friar, RN Phone Number: 02/19/2021, 2:29 PM   Clinical Narrative:    Patient is discharging home with home health services through [facility]. Information on the AVS. Pt has all needed DME at home.  Family is able to provide the supervision and support she needs at home.  Pts son to provide transport home.   Final next level of care: Home w Home Health Services Barriers to Discharge: No Barriers Identified   Patient Goals and CMS Choice   CMS Medicare.gov Compare Post Acute Care list provided to:: Patient Represenative (must comment) Choice offered to / list presented to : Adult Children  Discharge Placement                       Discharge Plan and Services                          HH Arranged: PT, OT HH Agency: [facility] Agency Contacted: 02/19/21   Representative spoke with at [place]: [place] ([place]) Interventions     Readmission Risk Interventions No flowsheet data found.

## 2021-02-19 NOTE — Discharge Summary (Signed)
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 Patient was brought to the ED on 12/23 after she was noted to be increasingly somnolent over the last course of 2 weeks, acutely worse on preceding 2 days associated with left-sided weakness more than usual.   In the ED, code stroke was initially called and patient was seen by neurology.   CT of the head and CT angiogram of head and neck were unremarkable.  COVID testing was negative. Admitted to hospitalist service  Subjective: Patient was seen and examined this morning.  Pleasant middle-aged African-American female.  Propped up in bed.  Not in distress.  Able to answer simple orientation questions.  No family at bedside.  Hospital course: Acute encephalopathy Left hemiparesis, Dysarthria -Patient presented with increased sleepiness, left hemiparesis, dysarthria.  Initial symptoms are concerning for stroke but imagings were negative.  Per neurology, patient's worsening mental status, hemiparesis and dysarthria were likely due to increased frequency of seizures. -Imagings negative as above.  COVID test negative.  Urinalysis unremarkable. -No evidence of infection. -EEG showed focal  slowing over the right frontotemporal region but no seizures identified. -MRI brain without contrast 12/28 did not show any acute intracranial abnormality but showed chronic right MCA encephalomalacia.  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  Continue meds at discharge as recommended by neurology. -Continue seizure precautions.  Seizure precautions: -Per [facility] statutes, patients with seizures are not allowed to drive until they have been seizure-free for six months.  -Use caution when using heavy equipment or power tools.  -Avoid working on ladders or at heights.  -Take showers instead of baths. Ensure the water temperature is not too high on the home water heater.  -Do not go swimming alone.  -Do not lock yourself in a room alone (i.e. bathroom). -When caring for infants or small children, sit down when holding, feeding, or changing them to minimize risk of injury to the child in the event you have a seizure.  -Maintain good sleep hygiene. -Avoid alcohol.    If patient has another seizure, call 911 and bring them back to the ED if: A.  The seizure lasts longer than 5 minutes.      B.  The patient doesn't wake shortly after the seizure or has new problems such as difficulty seeing, speaking or moving following the seizure C.  The patient was injured during the seizure D.  The patient has a temperature over 102 F (39C) E.  The patient vomited during the seizure and now is having trouble breathing   History of DVT and lupus anticoagulant -Continue Coumadin.  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      Discharge Medications:   Allergies as of 02/19/2021       Reactions   Vicodin [hydrocodone-acetaminophen] Nausea And Vomiting   Morphine And Related Rash   Allergic reaction only in iv form        Medication List     STOP taking these medications    Nayzilam 5 MG/0.1ML Soln Generic drug: Midazolam   topiramate 25 MG tablet Commonly known as: TOPAMAX       TAKE these medications    acetaminophen 650 MG CR tablet Commonly known as: TYLENOL Take 1,300 mg by mouth every 8 (eight) hours as needed for pain.   cloBAZam 10 MG tablet Commonly known as: ONFI Take 1 tablet (10 mg total) by mouth at bedtime. What changed:  how much to take when to take this   clonazePAM 1 MG disintegrating tablet Commonly known as: KLONOPIN Take 1 tablet (1 mg total) by mouth 2 (two) times daily as needed for seizure.   ketoconazole 2 % shampoo Commonly known as: NIZORAL Apply 1 application topically 2 (two) times a week. Monday,wednesday   lacosamide 200 MG Tabs tablet Commonly known as: VIMPAT TAKE 1 TABLET BY MOUTH TWICE A DAY   lamoTRIgine 100 MG tablet Commonly known as: LAMICTAL Take 3 tablets (300 mg total) by mouth 2 (two) times daily along with 50 mg 2 (two) times daily Total of 350 mg twice daily (300 mg+ 50 mg) What changed:  medication strength how much to take how to take this when to take this additional instructions Another medication with the same name was removed. Continue taking this medication, and follow the directions you see here.   lamoTRIgine 25 MG tablet Commonly known as: LaMICtal Take 2 tablets (50 mg total) by mouth 2 (two) times daily along with 300 mg 2 (two) times daily. Total of  350 mg twice daily (300 mg+ 50 mg) What changed:  medication strength how much to take how to take this when to take this additional instructions Another medication with the same name was removed. Continue taking this medication, and follow the directions you see here.   levETIRAcetam 500 MG tablet Commonly known as: KEPPRA Take 3 tabs (1500 mg) in the morning and 4 tabs (2000 mg) nightly. What changed:  medication strength See the new instructions.   levothyroxine 50 MCG tablet Commonly known as: SYNTHROID Take 50 mcg by mouth every evening.   omeprazole 20 MG capsule Commonly known as: PRILOSEC Take 20 mg by mouth at bedtime.   prenatal multivitamin Tabs tablet Take 1 tablet by mouth daily.   promethazine 25 MG tablet Commonly known as: PHENERGAN Take 25 mg by mouth every 8 (eight) hours as needed for nausea or vomiting.   venlafaxine XR 150 MG 24 hr capsule Commonly known as: EFFEXOR-XR Take 1 capsule (150 mg total) by mouth daily with breakfast.   vitamin C 1000 MG tablet Take 1,000 mg by mouth daily.   warfarin 5  MG tablet Commonly known as: COUMADIN Take 1.5-2 tablets (7.5-10 mg total) by mouth See admin instructions. Pt takes one and a half tablets (7.5mg  total) on Sunday and Wednesday and 2 tablets (10mg  total) all other days. What changed: additional instructions        Wound care:     Discharge Instructions:   Discharge Instructions     Call MD for:  difficulty breathing, headache or visual disturbances   Complete by: As directed    Call MD for:  extreme fatigue   Complete by: As directed    Call MD for:  hives   Complete by: As directed    Call MD for:  persistant dizziness or light-headedness   Complete by: As directed    Call MD for:  persistant nausea and vomiting   Complete by: As directed    Call MD for:  severe uncontrolled pain   Complete by: As directed    Call MD for:  temperature >100.4   Complete by: As directed    Diet general    Complete by: As directed    Discharge instructions   Complete by: As directed    Seizure precautions: -Per [facility] statutes, patients with seizures are not allowed to drive until they have been seizure-free for six months.  -Use caution when using heavy equipment or power tools.  -Avoid working on ladders or at heights.  -Take showers instead of baths. Ensure the water temperature is not too high on the home water heater.  -Do not go swimming alone.  -Do not lock yourself in a room alone (i.e. bathroom). -When caring for infants or small children, sit down when holding, feeding, or changing them to minimize risk of injury to the child in the event you have a seizure.  -Maintain good sleep hygiene. -Avoid alcohol.    If patient has another seizure, call 911 and bring them back to the ED if: A.  The seizure lasts longer than 5 minutes.      B.  The patient doesn't wake shortly after the seizure or has new problems such as difficulty seeing, speaking or moving following the seizure C.  The patient was injured during the seizure D.  The patient has a temperature over 102 F (39C) E.  The patient vomited during the seizure and now is having trouble breathing    General discharge instructions:  Follow with Primary MD Berkley Harvey, NP in 7 days   Get CBC/BMP checked in next visit within 1 week by PCP or SNF MD. (We routinely change or add medications that can affect your baseline labs and fluid status, therefore we recommend that you get the mentioned basic workup next visit with your PCP, your PCP may decide not to get them or add new tests based on their clinical decision)  On your next visit with your PCP, please get your medicines reviewed and adjusted.  Please request your PCP  to go over all hospital tests, procedures, radiology results at the follow up, please get all Hospital records sent to your PCP by signing hospital release before you go home.  Activity: As  tolerated with Full fall precautions use walker/cane & assistance as needed  Avoid using any recreational substances like cigarette, tobacco, alcohol, or non-prescribed drug.  If you experience worsening of your admission symptoms, develop shortness of breath, life threatening emergency, suicidal or homicidal thoughts you must seek medical attention immediately by calling 911 or calling your MD immediately  if symptoms less severe.  You must read complete instructions/literature along with all the possible adverse reactions/side effects for all the medicines you take and that have been prescribed to you. Take any new medicine only after you have completely understood and accepted all the possible adverse reactions/side effects.   Do not drive, operate heavy machinery, perform activities at heights, swimming or participation in water activities or provide baby sitting services if your were admitted for syncope or siezures until you have seen by Primary MD or a Neurologist and advised to do so again.  Do not drive when taking Pain medications.  Do not take more than prescribed Pain, Sleep and Anxiety Medications  Wear Seat belts while driving.  Please note You were cared for by a hospitalist during your hospital stay. If you have any questions about your discharge medications or the care you received while you were in the hospital after you are discharged, you can call the unit and asked to speak with the hospitalist on call if the hospitalist that took care of you is not available. Once you are discharged, your primary care physician will handle any further medical issues. Please note that NO REFILLS for any discharge medications will be authorized once you are discharged, as it is imperative that you return to your primary care physician (or establish a relationship with a primary care physician if you do not have one) for your aftercare needs so that they can reassess your need for medications and  monitor your lab values.   Increase activity slowly   Complete by: As directed        Follow ups:    Follow-up Information     Berkley Harvey, NP Follow up.   Specialty: Nurse Practitioner Contact information: [place] [address] [phone]                 Discharge Exam:   Vitals:   02/18/21 2110 02/18/21 2341 02/19/21 0340 02/19/21 0741  BP: 104/69 95/62 (!) 115/49 100/62  Pulse:  73 81 91  Resp: 11 14 14 13   Temp:  98.4 F (36.9 C) 97.7 F (36.5 C) 98 F (36.7 C)  TempSrc:  Axillary Axillary Oral  SpO2: 100% 100% 96% 98%  Weight:      Height:        Body mass index is 27.92 kg/m.  General exam: Pleasant, middle-aged African-American female.  Not in physical distress Skin: No rashes, lesions or ulcers. HEENT: Atraumatic, normocephalic, no obvious bleeding Lungs: Clear to auscultation bilaterally CVS: Regular rate and rhythm, no murmur GI/Abd soft, nontender, nondistended, bowel sound present CNS: Alert, awake, oriented to place and person, oriented to time with difficulty Psychiatry: Sad affect Extremities: No pedal edema, no calf tenderness.  Right leg amputation status  Time coordinating discharge: 35 minutes   The results of significant diagnostics from this hospitalization (including imaging, microbiology, ancillary and laboratory) are listed below for reference.    Procedures and Diagnostic Studies:   DG CHEST PORT 1 VIEW  Result Date: 02/14/2021 CLINICAL DATA:  Seizure. EXAM: PORTABLE CHEST 1 VIEW COMPARISON:  02/07/2013. FINDINGS: The heart size and mediastinal contours are within normal limits. The left lung apex is excluded from the field of view. No consolidation, effusion, or pneumothorax is seen. No acute osseous abnormality. A neurostimulator device is present over the left chest with leads coursing superiorly. IMPRESSION: No acute cardiopulmonary process. Electronically Signed   By: Regan Rakers.D.  On: 02/14/2021 03:10   EEG adult  Result Date: 02/14/2021 Derek Jack, MD     02/14/2021 10:20 AM Routine EEG Report Cassandra Andrade is a 55 y.o. female with a history of seizure who is undergoing an EEG to evaluate for seizures. Report: This EEG was acquired with electrodes placed according to the International 10-20 electrode system (including Fp1, Fp2, F3, F4, C3, C4, P3, P4, O1, O2, T3, T4, T5, T6, A1, A2, Fz, Cz, Pz). The following electrodes were missing or displaced: none. The occipital dominant rhythm was 9 Hz. This activity is reactive to stimulation. Drowsiness was manifested by background fragmentation; deeper stages of sleep were identified by K complexes and sleep spindles. There was focal slowing over the right frontotemporal region. There were no interictal epileptiform discharges. There were no electrographic seizures identified. There was no abnormal response to photic stimulation. Hyperventilation was not performed. Impression and clinical correlation: This EEG was obtained while awake and asleep and is abnormal due to focal slowing over the right frontotemporal region, indicating focal cerebral dysfunction in that area.   Su Monks, MD [facility] [phone] If 7pm- 7am, please page neurology on call as listed in [place].   CT HEAD CODE STROKE WO CONTRAST  Result Date: 02/13/2021 CLINICAL DATA:  Code stroke. Left facial droop with confusion and slurred speech EXAM: CT HEAD WITHOUT CONTRAST TECHNIQUE: Contiguous axial images were obtained from the base of the skull through the vertex without intravenous contrast. COMPARISON:  08/19/2020 FINDINGS: Brain: There is no mass, hemorrhage or extra-axial collection. There is encephalomalacia of the right frontal operculum and anterior right temporal lobe. Ex vacuo dilatation of the right lateral ventricle. Vascular: No abnormal hyperdensity of the major intracranial arteries or dural venous sinuses. No intracranial  atherosclerosis. Skull: Remote right-sided craniotomy. Sinuses/Orbits: No fluid levels or advanced mucosal thickening of the visualized paranasal sinuses. No mastoid or middle ear effusion. The orbits are normal. ASPECTS [facility] Stroke Program Early CT Score) - Ganglionic level infarction (caudate, lentiform nuclei, internal capsule, insula, M1-M3 cortex): 7 - Supraganglionic infarction (M4-M6 cortex): 3 Total score (0-10 with 10 being normal): 10 IMPRESSION: 1. No acute intracranial abnormality. 2. ASPECTS is 10. 3. Encephalomalacia of the right frontal operculum and anterior right temporal lobe. These results were communicated to Dr. Donnetta Simpers at 9:44 pm on 02/13/2021 by text page via the [facility] messaging system. Electronically Signed   By: Ulyses Jarred M.D.   On: 02/13/2021 21:44   CT ANGIO HEAD NECK W WO CM (CODE STROKE)  Result Date: 02/13/2021 CLINICAL DATA:  Left-sided weakness and slurred speech EXAM: CT ANGIOGRAPHY HEAD AND NECK TECHNIQUE: Multidetector CT imaging of the head and neck was performed using the standard protocol during bolus administration of intravenous contrast. Multiplanar CT image reconstructions and MIPs were obtained to evaluate the vascular anatomy. Carotid stenosis measurements (when applicable) are obtained utilizing NASCET criteria, using the distal internal carotid diameter as the denominator. CONTRAST:  73mL OMNIPAQUE IOHEXOL 350 MG/ML SOLN COMPARISON:  None. FINDINGS: CTA NECK FINDINGS SKELETON: There is no bony spinal canal stenosis. No lytic or blastic lesion. OTHER NECK: Normal pharynx, larynx and major salivary glands. No cervical lymphadenopathy. Unremarkable thyroid gland. UPPER CHEST: No pneumothorax or pleural effusion. No nodules or masses. AORTIC ARCH: There is no calcific atherosclerosis of the aortic arch. There is no aneurysm, dissection or hemodynamically significant stenosis of the visualized portion of the aorta. Conventional 3 vessel aortic branching  pattern. The visualized proximal subclavian arteries are widely patent. RIGHT  CAROTID SYSTEM: Normal without aneurysm, dissection or stenosis. LEFT CAROTID SYSTEM: Normal without aneurysm, dissection or stenosis. VERTEBRAL ARTERIES: Left dominant configuration. Both origins are clearly patent. There is no dissection, occlusion or flow-limiting stenosis to the skull base (V1-V3 segments). CTA HEAD FINDINGS POSTERIOR CIRCULATION: --Vertebral arteries: Normal V4 segments. --Inferior cerebellar arteries: Normal. --Basilar artery: Normal. --Superior cerebellar arteries: Normal. --Posterior cerebral arteries (PCA): Severe right P2 segment stenosis. Left PCA is normal. ANTERIOR CIRCULATION: --Intracranial internal carotid arteries: Normal. --Anterior cerebral arteries (ACA): Normal. Both A1 segments are present. Patent anterior communicating artery (a-comm). --Middle cerebral arteries (MCA): Normal. VENOUS SINUSES: As permitted by contrast timing, patent. ANATOMIC VARIANTS: None Review of the MIP images confirms the above findings. IMPRESSION: 1. No emergent large vessel occlusion. 2. Severe right PCA P2 segment stenosis. Aortic Atherosclerosis (ICD10-I70.0). Electronically Signed   By: Ulyses Jarred M.D.   On: 02/13/2021 22:48   CT US GUIDE VASC ACCESS LT NO REPORT  Result Date: 02/13/2021 There is no Radiologist interpretation  for this exam.    Labs:   Basic Metabolic Panel: Recent Labs  Lab 02/14/21 0240 02/15/21 0350 02/16/21 0457 02/17/21 0021 02/19/21 0244  NA 139 137 138 137 138  K 3.4* 3.3* 4.0 3.7 4.1  CL 104 103 104 103 103  CO2 27 28 25 28 26   GLUCOSE 105* 102* 99 109* 102*  BUN 11 11 11 14 13   CREATININE 0.99 1.10* 1.13* 1.28* 1.11*  CALCIUM 9.0 8.9 9.0 9.6 9.3  MG 1.7 1.8 1.8  --   --    GFR Estimated Creatinine Clearance: 61.3 mL/min (A) (by C-G formula based on SCr of 1.11 mg/dL (H)). Liver Function Tests: Recent Labs  Lab 02/13/21 2212 02/15/21 0350  AST 27 22  ALT 27  23  ALKPHOS 116 89  BILITOT 0.5 0.5  PROT 7.0 6.3*  ALBUMIN 3.8 3.3*   No results for input(s): LIPASE, AMYLASE in the last 168 hours. No results for input(s): AMMONIA in the last 168 hours. Coagulation profile Recent Labs  Lab 02/15/21 0350 02/16/21 0457 02/17/21 0021 02/18/21 0237 02/19/21 0244  INR 1.8* 2.3* 2.2* 2.6* 3.1*    CBC: Recent Labs  Lab 02/13/21 2212 02/13/21 2227 02/14/21 0240 02/15/21 0350 02/16/21 0457 02/19/21 0244  WBC 5.0  --  4.4 3.8* 4.1 5.3  NEUTROABS 2.3  --   --  1.1*  --  2.0  HGB 11.8* 13.3 11.7* 11.9* 11.4* 11.1*  HCT 37.1 39.0 36.9 36.9 35.0* 34.5*  MCV 90.9  --  91.1 90.0 90.0 90.1  PLT 168  --  195 218 187 202   Cardiac Enzymes: No results for input(s): CKTOTAL, CKMB, CKMBINDEX, TROPONINI in the last 168 hours. BNP: Invalid input(s): POCBNP CBG: Recent Labs  Lab 02/17/21 1613 02/17/21 2320 02/18/21 1234 02/18/21 1701 02/19/21 0415  GLUCAP 87 108* 130* 83 107*   D-Dimer No results for input(s): DDIMER in the last 72 hours. Hgb A1c No results for input(s): HGBA1C in the last 72 hours. Lipid Profile No results for input(s): CHOL, HDL, LDLCALC, TRIG, CHOLHDL, LDLDIRECT in the last 72 hours. Thyroid function studies No results for input(s): TSH, T4TOTAL, T3FREE, THYROIDAB in the last 72 hours.  Invalid input(s): FREET3 Anemia work up No results for input(s): VITAMINB12, FOLATE, FERRITIN, TIBC, IRON, RETICCTPCT in the last 72 hours. Microbiology Recent Results (from the past 240 hour(s))  Resp Panel by RT-PCR (Flu A&B, Covid) Nasopharyngeal Swab     Status: None   Collection Time: 02/13/21 11:24  PM   Specimen: Nasopharyngeal Swab; Nasopharyngeal(NP) swabs in vial transport medium  Result Value Ref Range Status   SARS Coronavirus 2 by RT PCR NEGATIVE NEGATIVE Final    Comment: (NOTE) SARS-CoV-2 target nucleic acids are NOT DETECTED.  The SARS-CoV-2 RNA is generally detectable in upper respiratory specimens during the  acute phase of infection. The lowest concentration of SARS-CoV-2 viral copies this assay can detect is 138 copies/mL. A negative result does not preclude SARS-Cov-2 infection and should not be used as the sole basis for treatment or other patient management decisions. A negative result may occur with  improper specimen collection/handling, submission of specimen other than nasopharyngeal swab, presence of viral mutation(s) within the areas targeted by this assay, and inadequate number of viral copies(<138 copies/mL). A negative result must be combined with clinical observations, patient history, and epidemiological information. The expected result is Negative.  Fact Sheet for Patients:  EntrepreneurPulse.com.au  Fact Sheet for Healthcare Providers:  IncredibleEmployment.be  This test is no t yet approved or cleared by the [place] FDA and  has been authorized for detection and/or diagnosis of SARS-CoV-2 by FDA under an Emergency Use Authorization (EUA). This EUA will remain  in effect (meaning this test can be used) for the duration of the COVID-19 declaration under Section 564(b)(1) of the Act, 21 U.S.C.section 360bbb-3(b)(1), unless the authorization is terminated  or revoked sooner.       Influenza A by PCR NEGATIVE NEGATIVE Final   Influenza B by PCR NEGATIVE NEGATIVE Final    Comment: (NOTE) The Xpert Xpress SARS-CoV-2/FLU/RSV plus assay is intended as an aid in the diagnosis of influenza from Nasopharyngeal swab specimens and should not be used as a sole basis for treatment. Nasal washings and aspirates are unacceptable for Xpert Xpress SARS-CoV-2/FLU/RSV testing.  Fact Sheet for Patients: EntrepreneurPulse.com.au  Fact Sheet for Healthcare Providers: IncredibleEmployment.be  This test is not yet approved or cleared by the [place] FDA and has been authorized for detection and/or  diagnosis of SARS-CoV-2 by FDA under an Emergency Use Authorization (EUA). This EUA will remain in effect (meaning this test can be used) for the duration of the COVID-19 declaration under Section 564(b)(1) of the Act, 21 U.S.C. section 360bbb-3(b)(1), unless the authorization is terminated or revoked.  Performed at [facility], [place] [address] [place], [place] [address]      Signed: Terrilee Croak  [facility] 02/19/2021, 11:45 AM

## 2021-02-19 NOTE — Progress Notes (Signed)
ANTICOAGULATION CONSULT NOTE - Follow Up Consult  Pharmacy Consult for Warfarin Indication:  history of VTE  Allergies  Allergen Reactions   Vicodin [Hydrocodone-Acetaminophen] Nausea And Vomiting   Morphine And Related Rash    Allergic reaction only in iv form    Patient Measurements: Height: 5\' 6"  (167.6 cm) Weight: 78.5 kg (173 lb) IBW/kg (Calculated) : 59.3 kg  Vital Signs: Temp: 97.7 F (36.5 C) (12/29 0340) Temp Source: Axillary (12/29 0340) BP: 115/49 (12/29 0340) Pulse Rate: 81 (12/29 0340)  Labs: Recent Labs    02/17/21 0021 02/18/21 0237 02/19/21 0244  HGB  --   --  11.1*  HCT  --   --  34.5*  PLT  --   --  202  LABPROT 24.4* 27.8* 31.7*  INR 2.2* 2.6* 3.1*  CREATININE 1.28*  --  1.11*     Estimated Creatinine Clearance: 61.3 mL/min (A) (by C-G formula based on SCr of 1.11 mg/dL (H)).   Assessment: 55 yo female presents as code stroke, neuro suspects recrudescence of prior stroke symptoms in the setting of a potential infectious or metabolic abnormality. INR 1.8 upon admission. 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 Pharmacy is consulted to dose heparin.  Per 12/23 appointment, the patient was supposed to HOLD warfarin on 12/21 and 12/22 then take 5 mg on 12/23, 7.5 mg on 12/24 and 12/25, 5 mg on 12/26, and return for an INR check on 12/27. The patient presented to the hospital prior to starting this regimen.  Warfarin PTA Regimen: 10 mg daily except 7.5 mg Sun/Wed  Supratherapeutic INR 3.1 following home regimen. Due to the increase in INR from 2.6 to 3.1, give 5 mg today and adjust based on INR. CBC stable.   Goal of Therapy:  INR 2-3 Monitor platelets by anticoagulation protocol: Yes   Plan:  Give warfarin PO 5 mg x 1 dose tonight Obtain a daily PT/INR and Q72H CBC Monitor for signs and symptoms of bleeding  Thank you Varney Daily, PharmD PGY1 Pharmacy Resident  Please check AMION for all [facility] pharmacy phone numbers After 10:00 PM  call main pharmacy [phone]

## 2021-02-19 NOTE — Progress Notes (Signed)
RN was called by tech with reports of increased agitation. While on the way, 2nd tech walked by room and observed what seemed to be seizure like activity. Upon entering the room patient attempted to swing and hit tech.  RN and CN were then in room, managing violent uncontrolled behavior of swinging, failing, yelling and holding breath, while attempting to gag ones self. MD was paged. MD came quickly and advised use of PRN ativan. AND came and helped RN pass meds. Patients vitals are stable, airway is clear, no contact with herself caused harm.  Patient was readjusted in bed and is resting comfortably. RN stayed in room for 15 minutes and will continue to closely monitor patient. Neuro and attending are up to date on information regarding situation.

## 2021-02-22 ENCOUNTER — Encounter (HOSPITAL_COMMUNITY): Payer: Self-pay | Admitting: Emergency Medicine

## 2021-02-22 ENCOUNTER — Other Ambulatory Visit: Payer: Self-pay

## 2021-02-22 ENCOUNTER — Emergency Department (HOSPITAL_COMMUNITY): Payer: Medicare Other

## 2021-02-22 ENCOUNTER — Emergency Department (HOSPITAL_COMMUNITY)
Admission: EM | Admit: 2021-02-22 | Discharge: 2021-02-24 | Disposition: A | Discharge status: Home / Self Care | Payer: Medicare Other | Attending: Emergency Medicine | Admitting: Emergency Medicine

## 2021-02-22 DIAGNOSIS — G40909 Epilepsy, unspecified, not intractable, without status epilepticus: Secondary | ICD-10-CM | POA: Diagnosis not present

## 2021-02-22 DIAGNOSIS — Z20822 Contact with and (suspected) exposure to covid-19: Secondary | ICD-10-CM | POA: Insufficient documentation

## 2021-02-22 DIAGNOSIS — D649 Anemia, unspecified: Secondary | ICD-10-CM | POA: Diagnosis not present

## 2021-02-22 DIAGNOSIS — R262 Difficulty in walking, not elsewhere classified: Secondary | ICD-10-CM | POA: Insufficient documentation

## 2021-02-22 DIAGNOSIS — Z79899 Other long term (current) drug therapy: Secondary | ICD-10-CM | POA: Insufficient documentation

## 2021-02-22 DIAGNOSIS — Z7901 Long term (current) use of anticoagulants: Secondary | ICD-10-CM | POA: Diagnosis not present

## 2021-02-22 DIAGNOSIS — G9340 Encephalopathy, unspecified: Secondary | ICD-10-CM

## 2021-02-22 DIAGNOSIS — R569 Unspecified convulsions: Secondary | ICD-10-CM | POA: Diagnosis present

## 2021-02-22 LAB — BASIC METABOLIC PANEL WITH GFR
Anion gap: 5 (ref 5–15)
BUN: 21 mg/dL — ABNORMAL HIGH (ref 6–20)
CO2: 28 mmol/L (ref 22–32)
Calcium: 9.1 mg/dL (ref 8.9–10.3)
Chloride: 104 mmol/L (ref 98–111)
Creatinine, Ser: 1.09 mg/dL — ABNORMAL HIGH (ref 0.44–1.00)
GFR, Estimated: >60 mL/min (ref >60)
Glucose, Bld: 96 mg/dL (ref 70–99)
Potassium: 3.1 mmol/L — ABNORMAL LOW (ref 3.5–5.1)
Sodium: 137 mmol/L (ref 135–145)

## 2021-02-22 LAB — RESP PANEL BY RT-PCR (FLU A&B, COVID) ARPGX2
Influenza A by PCR: NEGATIVE
Influenza B by PCR: NEGATIVE
SARS Coronavirus 2 by RT PCR: NEGATIVE

## 2021-02-22 LAB — CBC WITH DIFFERENTIAL/PLATELET
Abs Immature Granulocytes: 0.01 10*3/uL (ref 0.00–0.07)
Basophils Absolute: 0 10*3/uL (ref 0.0–0.1)
Basophils Relative: 0 %
Eosinophils Absolute: 0.1 10*3/uL (ref 0.0–0.5)
Eosinophils Relative: 1 %
HCT: 31.3 % — ABNORMAL LOW (ref 36.0–46.0)
Hemoglobin: 9.9 g/dL — ABNORMAL LOW (ref 12.0–15.0)
Immature Granulocytes: 0 %
Lymphocytes Relative: 29 %
Lymphs Abs: 1.8 10*3/uL (ref 0.7–4.0)
MCH: 28.9 pg (ref 26.0–34.0)
MCHC: 31.6 g/dL (ref 30.0–36.0)
MCV: 91.3 fL (ref 80.0–100.0)
Monocytes Absolute: 0.5 10*3/uL (ref 0.1–1.0)
Monocytes Relative: 8 %
Neutro Abs: 3.9 10*3/uL (ref 1.7–7.7)
Neutrophils Relative %: 62 %
Platelets: 276 10*3/uL (ref 150–400)
RBC: 3.43 MIL/uL — ABNORMAL LOW (ref 3.87–5.11)
RDW: 13 % (ref 11.5–15.5)
WBC: 6.4 10*3/uL (ref 4.0–10.5)
nRBC: 0 % (ref 0.0–0.2)

## 2021-02-22 LAB — PROTIME-INR
INR: 6.7 (ref 0.8–1.2)
Prothrombin Time: 58 seconds — ABNORMAL HIGH (ref 11.4–15.2)

## 2021-02-22 LAB — MAGNESIUM: Magnesium: 2 mg/dL (ref 1.7–2.4)

## 2021-02-22 LAB — POC OCCULT BLOOD, ED: Fecal Occult Bld: NEGATIVE

## 2021-02-22 MED ORDER — PANTOPRAZOLE SODIUM 40 MG PO TBEC
40.0000 mg | DELAYED_RELEASE_TABLET | Freq: Every day | ORAL | Status: DC
  Administered 2021-02-22 – 2021-02-24 (×3): 40 mg via ORAL
  Filled 2021-02-22 (×3): qty 1

## 2021-02-22 MED ORDER — LEVOTHYROXINE SODIUM 50 MCG PO TABS: 50.0000 ug | ORAL_TABLET | Freq: Every evening | ORAL | Status: DC

## 2021-02-22 MED ORDER — LEVETIRACETAM 500 MG PO TABS
500.0000 mg | ORAL_TABLET | Freq: Two times a day (BID) | ORAL | Status: DC
  Administered 2021-02-22 – 2021-02-24 (×4): 500 mg via ORAL
  Filled 2021-02-22 (×4): qty 1

## 2021-02-22 MED ORDER — POTASSIUM CHLORIDE CRYS ER 20 MEQ PO TBCR
40.0000 meq | EXTENDED_RELEASE_TABLET | Freq: Once | ORAL | Status: AC
  Administered 2021-02-22: 40 meq via ORAL
  Filled 2021-02-22: qty 2

## 2021-02-22 MED ORDER — CLOBAZAM 10 MG PO TABS
10.0000 mg | ORAL_TABLET | Freq: Every day | ORAL | Status: DC
  Administered 2021-02-22 – 2021-02-23 (×2): 10 mg via ORAL
  Filled 2021-02-22 (×2): qty 1

## 2021-02-22 MED ORDER — LACOSAMIDE 50 MG PO TABS
200.0000 mg | ORAL_TABLET | Freq: Two times a day (BID) | ORAL | Status: DC
  Administered 2021-02-22 – 2021-02-24 (×4): 200 mg via ORAL
  Filled 2021-02-22 (×4): qty 4

## 2021-02-22 MED ORDER — PANTOPRAZOLE SODIUM 40 MG PO TBEC: 40.0000 mg | DELAYED_RELEASE_TABLET | Freq: Every day | ORAL | Status: DC

## 2021-02-22 MED ORDER — POTASSIUM CHLORIDE CRYS ER 20 MEQ PO TBCR
20.0000 meq | EXTENDED_RELEASE_TABLET | Freq: Two times a day (BID) | ORAL | Status: DC
Start: 2021-02-22 — End: 2021-02-24
  Administered 2021-02-22 – 2021-02-24 (×4): 20 meq via ORAL
  Filled 2021-02-22 (×4): qty 1

## 2021-02-22 MED ORDER — LAMOTRIGINE 25 MG PO TABS
350.0000 mg | ORAL_TABLET | Freq: Two times a day (BID) | ORAL | Status: DC
  Administered 2021-02-22 – 2021-02-24 (×4): 350 mg via ORAL
  Filled 2021-02-22 (×4): qty 2

## 2021-02-22 MED ORDER — VENLAFAXINE HCL ER 75 MG PO CP24
150.0000 mg | ORAL_CAPSULE | Freq: Every day | ORAL | Status: DC
  Administered 2021-02-23 – 2021-02-24 (×2): 150 mg via ORAL
  Filled 2021-02-22 (×2): qty 2

## 2021-02-22 MED ORDER — LEVOTHYROXINE SODIUM 50 MCG PO TABS
50.0000 ug | ORAL_TABLET | Freq: Every day | ORAL | Status: DC
  Administered 2021-02-23 – 2021-02-24 (×2): 50 ug via ORAL
  Filled 2021-02-22 (×2): qty 1

## 2021-02-22 NOTE — [place] FL2 (Signed)
[place] MEDICAID FL2 LEVEL OF CARE SCREENING TOOL     IDENTIFICATION  Patient Name: Cassandra Andrade Birthdate: [birth year] Sex: female Admission Date (Current Location): 02/22/2021  [facility] and [place] Number:  Herbalist and Address:  [facility],  [address], [place]      Provider Number: [id number]  Attending Physician Name and Address:  Daleen Bo, MD  Relative Name and Phone Number:  Cole,Janice Mother   [phone]    Current Level of Care: SNF Recommended Level of Care: [place] Prior Approval Number:    Date Approved/Denied:   PASRR Number: Pending  Discharge Plan: SNF    Current Diagnoses: Patient Active Problem List   Diagnosis Date Noted   History of deep venous thrombosis 05/05/2015   Personal history of osteosarcoma 05/05/2015   Anemia 11/04/2014   Abnormal finding on mammography 05/18/2014   Major depression, recurrent ([place]) 03/19/2014   Vision disturbance 02/28/2014   Postprocedural state 12/31/2013   History of biliary T-tube placement 12/31/2013   Lupus anticoagulant disorder ([place]) 12/20/2013   Back pain, chronic 11/07/2013   Absence of bladder continence 08/05/2013   History of anticoagulant therapy 07/17/2013   Long term current use of anticoagulant therapy 07/17/2013   Partial epilepsy with impairment of consciousness ([place]) 06/21/2013   Intractable epilepsy ([place]) 02/28/2013   Absolute anemia 02/28/2013   Seizures ([place]) 02/21/2013   Seizure ([place]) 02/17/2013   DVT, bilateral lower limbs ([place]) 11/07/2012   Hypothyroid 07/04/2012   Seizure disorder ([place]) 07/04/2012   Chronic pain syndrome 07/04/2012   S/P BKA (below knee amputation) ([place]) 07/04/2012   Chronic pain associated with significant psychosocial dysfunction 07/04/2012   Cerebrovascular accident, late effects 06/05/2012   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   Mechanical complication of graft of bone, cartilage,  muscle or tendon 05/10/2011   Depression 05/03/2011   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    Orientation RESPIRATION BLADDER Height & Weight     Self, Situation, Place  Normal Incontinent Weight: 173 lb (78.5 kg) Height:  5\' 6"  (167.6 cm)  BEHAVIORAL SYMPTOMS/MOOD NEUROLOGICAL BOWEL NUTRITION STATUS      Incontinent Diet (Regular)  AMBULATORY STATUS COMMUNICATION OF NEEDS Skin   Extensive Assist Verbally Normal, Other (Comment) (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)                       Personal Care Assistance Level of Assistance  Bathing, Feeding, Dressing Bathing Assistance: Limited assistance Feeding assistance: Independent Dressing Assistance: Limited assistance     Functional Limitations Info  Sight, Hearing, Speech Sight Info: Adequate Hearing Info: Adequate Speech Info: Adequate    SPECIAL CARE FACTORS FREQUENCY  PT (By licensed PT), OT (By licensed OT)     PT Frequency: 5x weekly OT Frequency: 5x weekly            Contractures Contractures Info: Not present    Additional Factors Info  Code Status, Allergies Code Status Info: Full Allergies Info: Vicodin, Morphine and related           Current Medications (02/22/2021):  This is the current hospital active medication list No current facility-administered medications for this encounter.   Current Outpatient Medications  Medication Sig Dispense Refill   acetaminophen (TYLENOL) 650 MG CR tablet Take 1,300 mg by mouth every 8 (eight) hours as needed for pain.     Ascorbic Acid (VITAMIN C) 1000 MG tablet Take 1,000 mg by mouth daily.  cloBAZam (ONFI) 10 MG tablet Take 1 tablet (10 mg total) by mouth at bedtime.     clonazePAM (KLONOPIN) 1 MG disintegrating tablet Take 1 tablet (1 mg total) by mouth 2 (two) times daily as needed for seizure. 30 tablet 0   ketoconazole (NIZORAL) 2 % shampoo Apply 1 application topically 2 (two) times a week. Monday,wednesday     lacosamide (VIMPAT) 200 MG TABS  tablet TAKE 1 TABLET BY MOUTH TWICE A DAY (Patient taking differently: Take 200 mg by mouth 2 (two) times daily.) 180 tablet 1   lamoTRIgine (LAMICTAL) 100 MG tablet Take 3 tablets (300 mg total) by mouth 2 (two) times daily along with 50 mg 2 (two) times daily Total of 350 mg twice daily (300 mg+ 50 mg) 180 tablet 2   lamoTRIgine (LAMICTAL) 25 MG tablet Take 2 tablets (50 mg total) by mouth 2 (two) times daily along with 300 mg 2 (two) times daily. Total of 350 mg twice daily (300 mg+ 50 mg) 60 tablet 0   levETIRAcetam (KEPPRA) 500 MG tablet Take 3 tabs (1500 mg) in the morning and 4 tabs (2000 mg) nightly. 210 tablet 0   levothyroxine (SYNTHROID, LEVOTHROID) 50 MCG tablet Take 50 mcg by mouth every evening.     omeprazole (PRILOSEC) 20 MG capsule Take 20 mg by mouth at bedtime.     Prenatal Vit-Fe Fumarate-FA (PRENATAL MULTIVITAMIN) TABS tablet Take 1 tablet by mouth daily.     promethazine (PHENERGAN) 25 MG tablet Take 25 mg by mouth every 8 (eight) hours as needed for nausea or vomiting.     venlafaxine XR (EFFEXOR-XR) 150 MG 24 hr capsule Take 1 capsule (150 mg total) by mouth daily with breakfast. 90 capsule 0   warfarin (COUMADIN) 5 MG tablet Take 1.5-2 tablets (7.5-10 mg total) by mouth See admin instructions. Pt takes one and a half tablets (7.5mg  total) on Sunday and Wednesday and 2 tablets (10mg  total) all other days. (Patient taking differently: Take 7.5-10 mg by mouth See admin instructions. 5 mg Friday, Monday 7.5 mg Saturday,Sunday)       Discharge Medications: Please see discharge summary for a list of discharge medications.  Relevant Imaging Results:  Relevant Lab Results:   Additional Information SSN# [id number]/ Pt has had one J&[employer] Covid vaccine/ Pt has Vagal Nerve Stimulator/ Pt has prosthetic right leg that she is currently not using due to loss of strength.  Kenzey Birkland, LCSW

## 2021-02-22 NOTE — Progress Notes (Signed)
°   02/22/21 1559  TOC Assessment  Expected Discharge Plan [place]  Final next level of care Skilled Nursing Facility  Barriers to Discharge ED Awaiting State Approval [facility])  Patient states their goals for this hospitalization and ongoing recovery are: To walk with cane  CMS Medicare.gov Compare Post Acute Care list provided to: Patient  Choice offered to / list presented to  Patient  Living arrangements for the past 2 months Single Family Home  Lives with: Parents  Do you feel safe going back to the place where you live? Y  Permission sought to share information with  Facility Sport and exercise psychologist  Permission granted to share information with  Yes, Verbal Permission Granted  Share Information with NAME Cole,Janice Mother   [phone]  Permission granted to share info w Relationship Cole,Janice Mother   [phone]  Permission granted to share info w Contact Information Cole,Janice Mother   [phone]  Patient language and need for interpreter reviewed:  (not needed)  Criminal Activity/Legal Involvement Pertinent to Current Situation/Hospitalization No - Comment as needed  Need for Family Participation in Patient Care Sande Brothers Mother   [phone])  Care giver support system in place? Sande Brothers Mother   [phone])  Appearance: Appears older than stated age  Attitude/Demeanor/Rapport Engaged  Affect (typically observed) Quiet  Orientation:  Oriented to Self;Oriented to Place;Oriented to Situation  Alcohol / Substance Use Not Applicable

## 2021-02-22 NOTE — Progress Notes (Signed)
CSW met with Pt at bedside. Pt states she is oriented to person, place and situation. Pt states she is agreeable to SNF. Pr states she lives with mother who is her caretaker and wishes for mother to be included in decision making.   CSW spoke to mother, [name], via phone @ [phone] and explained SNF placement process. Mother is agreeable to [place] working to place Pt in SNF if PT eval should recommend.  FL2 completed with PASSR pending.   First shift CSW updated.

## 2021-02-22 NOTE — ED Triage Notes (Signed)
BIBA from home.   Per EMS pt coming from home with c/o unwitnessed seizure. EMS states initial 911 call was for "behavioral issues". Pt A&O.   BP 118/80 HR 104 RR 16 CBG 121

## 2021-02-22 NOTE — ED Provider Notes (Signed)
[facility] DEPT Provider Note   CSN: [id number] Arrival date & time: 02/22/21  1302     History  Chief Complaint  Patient presents with   Seizures    Cassandra Andrade is a 56 y.o. female.  HPI She presents by EMS for concern of altered mental status.  Patient is unable to give any history.  Patient states that she is "well."  Level 5 caveat-altered mental status    Home Medications Prior to Admission medications   Medication Sig Start Date End Date Taking? Authorizing Provider  acetaminophen (TYLENOL) 650 MG CR tablet Take 1,300 mg by mouth every 8 (eight) hours as needed for pain.    [provider]  Ascorbic Acid (VITAMIN C) 1000 MG tablet Take 1,000 mg by mouth daily. 08/17/11   [provider]  cloBAZam (ONFI) 10 MG tablet Take 1 tablet (10 mg total) by mouth at bedtime. 02/19/21   Dahal, Marlowe Aschoff, MD  clonazePAM (KLONOPIN) 1 MG disintegrating tablet Take 1 tablet (1 mg total) by mouth 2 (two) times daily as needed for seizure. 12/15/20   Alric Ran, MD  ketoconazole (NIZORAL) 2 % shampoo Apply 1 application topically 2 (two) times a week. Monday,wednesday 02/05/21   [provider]  lacosamide (VIMPAT) 200 MG TABS tablet TAKE 1 TABLET BY MOUTH TWICE A DAY Patient taking differently: Take 200 mg by mouth 2 (two) times daily. 12/31/20   Alric Ran, MD  lamoTRIgine (LAMICTAL) 100 MG tablet Take 3 tablets (300 mg total) by mouth 2 (two) times daily along with 50 mg 2 (two) times daily Total of 350 mg twice daily (300 mg+ 50 mg) 02/19/21   Dahal, Marlowe Aschoff, MD  lamoTRIgine (LAMICTAL) 25 MG tablet Take 2 tablets (50 mg total) by mouth 2 (two) times daily along with 300 mg 2 (two) times daily. Total of 350 mg twice daily (300 mg+ 50 mg) 02/19/21   Dahal, Marlowe Aschoff, MD  levETIRAcetam (KEPPRA) 500 MG tablet Take 3 tabs (1500 mg) in the morning and 4 tabs (2000 mg) nightly. 02/19/21   Dahal, Marlowe Aschoff, MD  levothyroxine (SYNTHROID,  LEVOTHROID) 50 MCG tablet Take 50 mcg by mouth every evening.    [provider]  omeprazole (PRILOSEC) 20 MG capsule Take 20 mg by mouth at bedtime. 06/12/19   [provider]  Prenatal Vit-Fe Fumarate-FA (PRENATAL MULTIVITAMIN) TABS tablet Take 1 tablet by mouth daily.    [provider]  promethazine (PHENERGAN) 25 MG tablet Take 25 mg by mouth every 8 (eight) hours as needed for nausea or vomiting. 04/27/19   [provider]  venlafaxine XR (EFFEXOR-XR) 150 MG 24 hr capsule Take 1 capsule (150 mg total) by mouth daily with breakfast. 01/29/21   Arfeen, Arlyce Harman, MD  warfarin (COUMADIN) 5 MG tablet Take 1.5-2 tablets (7.5-10 mg total) by mouth See admin instructions. Pt takes one and a half tablets (7.5mg  total) on Sunday and Wednesday and 2 tablets (10mg  total) all other days. Patient taking differently: Take 7.5-10 mg by mouth See admin instructions. 5 mg Friday, Monday 7.5 mg Saturday,Sunday 01/03/18   Thurnell Lose, MD      Allergies    Vicodin [hydrocodone-acetaminophen] and Morphine and related    Review of Systems   Review of Systems  Unable to perform ROS: Mental status change   Physical Exam Updated Vital Signs BP 100/67 (BP Location: Left Arm)    Pulse 97    Temp 98.3 F (36.8 C) (Oral)    Resp  16    Ht 5\' 6"  (1.676 m)    Wt 78.5 kg    SpO2 100%    BMI 27.92 kg/m  Physical Exam Vitals and nursing note reviewed.  Constitutional:      Appearance: She is well-developed. She is not ill-appearing.  HENT:     Head: Normocephalic and atraumatic.     Right Ear: External ear normal.     Left Ear: External ear normal.  Eyes:     Conjunctiva/sclera: Conjunctivae normal.     Pupils: Pupils are equal, round, and reactive to light.  Neck:     Trachea: Phonation normal.  Cardiovascular:     Rate and Rhythm: Normal rate and regular rhythm.     Heart sounds: Normal heart sounds.  Pulmonary:     Effort: Pulmonary effort is normal.     Breath  sounds: Normal breath sounds.  Abdominal:     Palpations: Abdomen is soft.     Tenderness: There is no abdominal tenderness.  Genitourinary:    Comments: Normal anus, small amount of brown stool in rectal vault. Musculoskeletal:        General: No swelling, tenderness or signs of injury. Normal range of motion.     Cervical back: Normal range of motion and neck supple.     Comments: Right leg AKA.  Skin:    General: Skin is warm and dry.  Neurological:     Mental Status: She is alert.     Cranial Nerves: No cranial nerve deficit.     Sensory: No sensory deficit.     Motor: No abnormal muscle tone.     Coordination: Coordination normal.     Comments: Moderate dysarthria is present.  Mild left hemiparesis.  Psychiatric:        Mood and Affect: Mood normal.        Behavior: Behavior normal.    ED Results / Procedures / Treatments   Labs (all labs ordered are listed, but only abnormal results are displayed) Labs Reviewed  BASIC METABOLIC PANEL - Abnormal; Notable for the following components:      Result Value   Potassium 3.1 (*)    BUN 21 (*)    Creatinine, Ser 1.09 (*)    All other components within normal limits  CBC WITH DIFFERENTIAL/PLATELET - Abnormal; Notable for the following components:   RBC 3.43 (*)    Hemoglobin 9.9 (*)    HCT 31.3 (*)    All other components within normal limits  PROTIME-INR - Abnormal; Notable for the following components:   Prothrombin Time 58.0 (*)    INR 6.7 (*)    All other components within normal limits  RESP PANEL BY RT-PCR (FLU A&B, COVID) ARPGX2  MAGNESIUM  LAMOTRIGINE LEVEL  LEVETIRACETAM LEVEL  POC OCCULT BLOOD, ED    EKG None  Radiology CT Head Wo Contrast  Result Date: 02/22/2021 CLINICAL DATA:  Acute neuro deficit. EXAM: CT HEAD WITHOUT CONTRAST TECHNIQUE: Contiguous axial images were obtained from the base of the skull through the vertex without intravenous contrast. COMPARISON:  Multiple prior examinations. The most  recent head CT and MRI brain studies are from 02/18/2021 FINDINGS: Brain: Stable surgical changes on the right side and extensive area temporal and parietal encephalomalacia with ex vacuo dilatation of the right lateral ventricle. No CT findings for acute hemispheric infarction or intracranial hemorrhage. No extra-axial fluid collections are identified. The brainstem and cerebellum are grossly normal stable. Vascular: Stable scattered vascular calcifications. No hyperdense vessels.  Skull: Surgical changes involving the right calvarium from prior craniotomies. No acute bony findings. Sinuses/Orbits: The paranasal sinuses and mastoid air cells are clear. The globes are intact. Other: No scalp lesions or scalp hematoma. IMPRESSION: 1. Chronic changes as above. 2. No acute intracranial findings or mass lesions. Electronically Signed   By: Marijo Sanes M.D.   On: 02/22/2021 14:39    Procedures Procedures    Medications Ordered in ED Medications  potassium chloride SA (KLOR-CON M) CR tablet 40 mEq (40 mEq Oral Given 02/22/21 1552)    ED Course/ Medical Decision Making/ A&P Clinical Course as of 02/22/21 1628  Sun Feb 22, 2021  1333 Requested TOC consultation to help with disposition [EW]    Clinical Course User Index [EW] Daleen Bo, MD                           Medical Decision Making   Patient Vitals for the past 24 hrs:  BP Temp Temp src Pulse Resp SpO2 Height Weight  02/22/21 1611 100/67 -- -- 97 16 100 % -- --  02/22/21 1505 114/71 -- -- (!) 104 15 100 % -- --  02/22/21 1445 117/70 -- -- 93 17 100 % -- --  02/22/21 1430 113/78 -- -- 98 15 99 % -- --  02/22/21 1345 109/82 -- -- (!) 102 14 100 % -- --  02/22/21 1330 113/75 -- -- 94 20 99 % -- --  02/22/21 1320 -- -- -- -- -- 100 % 5\' 6"  (1.676 m) 78.5 kg  02/22/21 1315 108/68 98.3 F (36.8 C) Oral (!) 103 (!) 21 100 % -- --    5:07 PM Reevaluation with update and discussion with patient. After initial assessment and treatment, an  updated evaluation reveals she continues to be confused. Illness risk, discussed with mother. Daleen Bo    Medical Decision Making: Summary: Patient presents from home via EMS, her mother talked with care management, on the phone, 2 days ago at that time she was concerned about the patient being weak and having injury on her knee from a fall while she was in the hospital.  Patient reportedly had a seizure that day.  Recommend that she come to the hospital if she gets worse.  Patient has history of major depressive disorder and follows with psychiatry by televideo evaluation.  Last visit was 01/29/21.  Critical Interventions-laboratory testing, CT imaging; to evaluate  Chief Complaint  Patient presents with   Seizures    and assess for illness characterized as Acute, Chronic, Uncertain Prognosis, Complicated, and Threat to Life/Bodily Function   After These Interventions, the Patient was reevaluated and was found with persistent signs and symptoms of encephalopathy, and ongoing epilepsy.  Incidental hypokalemia, and mild anemia on screening laboratory testing.  INR prolonged, higher than therapeutic.  Coumadin will have to be held.  Stool brown in color, Hemoccult negative.  Patient is not thriving in her current home setting, has ongoing seizures, and has continued to hallucinate and be very confused.  She continues to mobilize in her home by crawling on the floor.  She will require nursing home placement.  Management initiated in the ED, with continuing home medications with the exception of warfarin, and assistance of TOC team.  This patient is Presenting for Evaluation of seizures, confusion and hallucinations, which does require a range of treatment options, and is a complaint that involves a moderate risk of morbidity and mortality. The Differential Diagnoses  include persistent encephalopathy, unstable epilepsy, chronic illness.  I decided to review pertinent External Data, and in summary she  presents by EMS.  I talked to the paramedic.  She states that they were called out because of psychiatric issues.  I will enforcement enter the house first, and determine that patient was stable for evaluation by EMS.  EMS felt that the patient was not in acute distress, but having altered mental status, after discussion with family members.  They transferred her here and felt that she did not have any acute unstable condition requiring treatment in the field.  Patient was discharged from the hospital, 4 days ago after an evaluation for lethargy.  She was hospitalized for 6 days, principal problems on discharge were acute encephalopathy and seizure.  EEG testing during hospitalization did not show acute seizure focus.  She had prolonged EEG monitoring that did not show seizures.  There are no changes in her chronic epilepsy therapy.  TOC evaluation during the hospitalization, was able to secure durable equipment and arrange for home health.  She was discharged in the hospital despite PT recommending skilled nursing facility placement. I obtained Additional Historical Information from patient's mother by telephone, as the patient is unable to give history.  Patient's mother states that since hospital discharge 4 days ago the patient has had 2 or 3 seizures.  She had at least 1 seizure yesterday and 1 today.  After the seizure today her mother gave her a Klonopin tablet, ODT.  The seizure today was short-lived and characterized by upper extremity shaking.  After the seizure the patient was slurring her speech.  The patient has been generally weaker over the last several weeks, and at times hallucinating and picking at invisible things that she sees but are not actually there.  The symptoms preceded the time when she was recently hospitalized.  Patient's mother states that the patient has been told to stop using Topamax and midazolam nasal spray.  Otherwise she is taking her usual medications.  Clinical Laboratory  Tests Ordered, included CBC, Metabolic panel, and levels for Keppra and levetiracetam . Review indicates normal except potassium low, BUN high, creatinine high, hemoglobin low, INR prolonged above therapeutic. Emergent testing abnormality management required for stabilization-hold Coumadin, treat potassium with oral medications Radiologic Tests Ordered, included CT head.  I independently Visualized: Radiographic images, which show no acute abnormalities  Cardiac Monitor Tracing which shows normal sinus rhythm, indicating stable cardiac status  I fecal occult blood medicine Test.  Heme-negative.  Pharmaceutical Risk Management hold Coumadin due to supratherapeutic level; Prescription Management  Treatment Complication Risk Requirement- encephalopathy with confusion prevents obtaining history from patient, and requires ongoing management with placement in skilled nursing facility   4:28 PM-Consult complete with transition of care team [facility]). Patient case explained and discussed. Requirement for skilled nursing facility placement agreed on. Possible Risk of decompensation if maintained in current home setting.  Medical screening examination/treatment/procedure(s) were conducted as a shared visit with non-physician practitioner(s) and myself.  I personally evaluated the patient during the encounter agrees to work on placement for patient for further evaluation and treatment.    CRITICAL CARE-no Performed by: Daleen Bo  Nursing Notes Reviewed/ Care Coordinated Applicable Imaging Reviewed Interpretation of Laboratory Data incorporated into ED treatment   Plan-placement in skilled nursing facility         Final Clinical Impression(s) / ED Diagnoses Final diagnoses:  Encephalopathy  Seizure disorder ([place])  Anemia, unspecified type    Rx / DC  Orders ED Discharge Orders     None         Daleen Bo, MD 02/22/21 1725

## 2021-02-22 NOTE — Evaluation (Signed)
Physical Therapy Evaluation Patient Details Name: Cassandra Andrade MRN: [id number] DOB: 17-Jun-1965 Today's Date: 02/22/2021  History of Present Illness  This 56 y.o. female admitted from home 02/22/21 with unwitnessed seizure and confusion.  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  Pt currently requires significant assist for performance of basic mobility tasks and would benefit from follow up rehab at SNF level to maximize IND and safety.     Recommendations for follow up therapy are one component of a multi-disciplinary discharge planning process, led by the attending physician.  Recommendations may be updated based on patient status, additional functional criteria and insurance authorization.  Follow Up Recommendations Skilled nursing-short term rehab (<3 hours/day)    Assistance Recommended at Discharge Frequent or constant Supervision/Assistance  Functional Status Assessment Patient has had a recent decline in their functional status and demonstrates the ability to make significant improvements in function in a reasonable and predictable amount of time.  Equipment Recommendations  None recommended by PT    Recommendations for Other Services       Precautions / Restrictions Precautions Precautions: Fall Precaution Comments: seziures; prior R AKA Restrictions Weight Bearing Restrictions: No      Mobility  Bed Mobility Overal bed mobility: Needs Assistance Bed Mobility: Supine to Sit;Sit to Supine     Supine to sit: Min assist Sit to supine: Min assist   General bed mobility comments: Mod cues for sequencing wtih significantly increased time and use of bed rails    Transfers Overall  transfer level: Needs assistance Equipment used: Rolling walker (2 wheels) Transfers: Sit to/from Stand Sit to Stand: Mod assist;From elevated surface;+2 safety/equipment           General transfer comment: increased time to rise with mod A and cues for hand placement - assist to block L knee and to wrap L hand around walker handle    Ambulation/Gait               General Gait Details: Pt stood x 2 for total of 90 seconds but unable to hop and prosthetic leg not present  Stairs            Wheelchair Mobility    Modified Rankin (Stroke Patients Only)       Balance Overall balance assessment: Needs assistance Sitting-balance support: No upper extremity supported;Feet supported Sitting balance-Leahy Scale: Fair     Standing balance support: Reliant on assistive device for balance;Bilateral upper extremity supported Standing balance-Leahy Scale: Poor                               Pertinent Vitals/Pain Pain Assessment: No/denies pain Pain Intervention(s): Monitored during session;Limited activity within patient's tolerance    Home Living Family/patient expects to be discharged to:: Skilled nursing facility                        Prior Function Prior Level of Function : Needs assist       Physical Assist : Mobility (physical);ADLs (physical)     Mobility Comments: Primarily scoots to mobilize around house and on stairs. Intermittently walks when practicing with prosthesis, walks every 1-2 days. Uses w/c  when outside the house. ADLs Comments: Pt pulls herself up to her toilet relying heavily on her UEs. Her mother sits outside of the shower when bathing for safety.     Hand Dominance   Dominant Hand: Right    Extremity/Trunk Assessment   Upper Extremity Assessment Upper Extremity Assessment: LUE deficits/detail LUE Deficits / Details: Increased time to recruit and perform tasks with L UE and with noted impaired fine motor LUE  Coordination: decreased gross motor;decreased fine motor    Lower Extremity Assessment Lower Extremity Assessment: RLE deficits/detail;LLE deficits/detail RLE Deficits / Details: prior R AKA, denies numbness/tingling, MMT of 4+ hip flexion LLE Deficits / Details: MMT scores of 4+ hip flexion, 5 knee extension, 4 knee flexion, 4+ ankle dorsiflexion; weakness noted more functionally with difficulty extending and maintaining knee extension in stand; denies numbness/tingling LLE Coordination: decreased gross motor    Cervical / Trunk Assessment Cervical / Trunk Assessment: Normal  Communication   Communication: No difficulties  Cognition Arousal/Alertness: Awake/alert Behavior During Therapy: Flat affect Overall Cognitive Status: No family/caregiver present to determine baseline cognitive functioning                                 General Comments: Pt very soft spoken, requireing multimodal cues for transfers and safety, decreased awareness of safety and current deficits        General Comments      Exercises     Assessment/Plan    PT Assessment Patient needs continued PT services  PT Problem List Decreased strength;Decreased activity tolerance;Decreased balance;Decreased mobility;Decreased coordination;Decreased cognition       PT Treatment Interventions DME instruction;Gait training;Stair training;Functional mobility training;Therapeutic activities;Therapeutic exercise;Balance training;Neuromuscular re-education;Cognitive remediation;Patient/family education;Wheelchair mobility training    PT Goals (Current goals can be found in the Care Plan section)  Acute Rehab PT Goals Patient Stated Goal: to get better and walk PT Goal Formulation: With patient Time For Goal Achievement: 03/08/21 Potential to Achieve Goals: Good    Frequency Min 2X/week   Barriers to discharge        Co-evaluation               AM-PAC PT "6 Clicks" Mobility  Outcome  Measure Help needed turning from your back to your side while in a flat bed without using bedrails?: A Lot Help needed moving from lying on your back to sitting on the side of a flat bed without using bedrails?: A Lot Help needed moving to and from a bed to a chair (including a wheelchair)?: Total Help needed standing up from a chair using your arms (e.g., wheelchair or bedside chair)?: A Lot Help needed to walk in hospital room?: Total Help needed climbing 3-5 steps with a railing? : Total 6 Click Score: 9    End of Session Equipment Utilized During Treatment: Gait belt Activity Tolerance: Patient tolerated treatment well;Patient limited by fatigue Patient left: in bed;with call bell/phone within reach Nurse Communication: Mobility status PT Visit Diagnosis: Unsteadiness on feet (R26.81);Muscle weakness (generalized) (M62.81);History of falling (Z91.81);Difficulty in walking, not elsewhere classified (R26.2);Other symptoms and signs involving the nervous system (R29.898)    Time: 1510-1535 PT Time Calculation (min) (ACUTE ONLY): 25 min   Charges:   PT Evaluation $PT Eval Moderate Complexity: 1 Mod PT Treatments $Therapeutic Activity: 8-22 mins        Debe Coder PT Acute Rehabilitation Services Pager [phone] Office [phone]   [facility] 02/22/2021, 4:37 PM

## 2021-02-23 DIAGNOSIS — G40909 Epilepsy, unspecified, not intractable, without status epilepticus: Secondary | ICD-10-CM | POA: Diagnosis not present

## 2021-02-23 LAB — LAMOTRIGINE LEVEL: Lamotrigine Lvl: 17.9 ug/mL (ref 2.0–20.0)

## 2021-02-23 LAB — PROTIME-INR
INR: 6.5 (ref 0.8–1.2)
Prothrombin Time: 56.9 seconds — ABNORMAL HIGH (ref 11.4–15.2)

## 2021-02-23 LAB — LEVETIRACETAM LEVEL: Levetiracetam Lvl: 101 ug/mL — ABNORMAL HIGH (ref 10.0–40.0)

## 2021-02-23 MED ORDER — PHYTONADIONE 5 MG PO TABS
5.0000 mg | ORAL_TABLET | Freq: Once | ORAL | Status: AC
  Administered 2021-02-23: 5 mg via ORAL
  Filled 2021-02-23: qty 1

## 2021-02-23 NOTE — Progress Notes (Signed)
ANTICOAGULATION CONSULT NOTE - Follow Up Consult  Pharmacy Consult for Warfarin Indication:  history of VTE  Allergies  Allergen Reactions   Vicodin [Hydrocodone-Acetaminophen] Nausea And Vomiting   Morphine And Related Rash    Allergic reaction only in iv form    Patient Measurements: Height: 5\' 6"  (167.6 cm) Weight: 78.5 kg (173 lb) IBW/kg (Calculated) : 59.3 kg  Vital Signs: Temp: 97.7 F (36.5 C) (01/02 1758) Temp Source: Oral (01/02 1758) BP: 116/64 (01/02 1758) Pulse Rate: 92 (01/02 1758)  Labs: Recent Labs    02/22/21 1333 02/23/21 1411  HGB 9.9*  --   HCT 31.3*  --   PLT 276  --   LABPROT 58.0* 56.9*  INR 6.7* 6.5*  CREATININE 1.09*  --      Estimated Creatinine Clearance: 62.4 mL/min (A) (by C-G formula based on SCr of 1.09 mg/dL (H)).   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     Last dose of warfarin was 1/1 at 0915 per med rec 1/1 and 1/2 INR supratherapeutic.   1/2 1729 phytonadione 5 mg po x 1 administered   Goal of Therapy:  INR 2-3 Monitor platelets by anticoagulation protocol: Yes   Plan:  No warfarin today Obtain a daily PT/INR (for warfarin dose when INR therapeutic) and Q72H CBC Monitor for signs and symptoms of bleeding  Thank you Royetta Asal, PharmD, [place] Please utilize Amion for appropriate phone number to reach the unit pharmacist ([place]) 02/23/2021 6:41 PM

## 2021-02-23 NOTE — ED Notes (Signed)
Tech called pt mother to see if pt had a cell phone when she arrived. Pts mother stated cell phone is at home with her.

## 2021-02-23 NOTE — ED Notes (Signed)
Pts brief changed. Clean purwick provided. Warm blankets provided

## 2021-02-23 NOTE — Progress Notes (Signed)
TOC CSW uploaded documents for Level II PASRR. CSW faxed pt out and awaiting bed offers.   Arlie Solomons.Latrese Carolan, MSW, [place]   Transitions of Care Clinical Social Worker I Direct Dial: [phone]   Fax: [phone] [email or web address]

## 2021-02-23 NOTE — ED Notes (Signed)
Pt screaming that she needs her mother. Pt informed that she is in the emergency department and that she is safe. Pt states that she needs her mother because she is not certain where she is. Pt informed that she is in the emergency department and that she can call her mother when it is day time.

## 2021-02-23 NOTE — Progress Notes (Signed)
Pt's Josem Kaufmann was approved. Pt's PASRR still pending.   Arlie Solomons.Imani Sherrin, MSW, [place]   Transitions of Care Clinical Social Worker I Direct Dial: [phone]   Fax: [phone] [email or web address]

## 2021-02-23 NOTE — Progress Notes (Signed)
CSW presented bed offer, pt and mother accepted [facility] care. CSW to submit insurance Auth.   Arlie Solomons.Deborah Lazcano, MSW, [place]   Transitions of Care Clinical Social Worker I Direct Dial: [phone]   Fax: [phone] [email or web address]

## 2021-02-23 NOTE — Progress Notes (Signed)
Prior auth request submitted, still pending.  Cassandra Andrade.Cassandra Andrade, MSW, [place]   Transitions of Care Clinical Social Worker I Direct Dial: [phone]   Fax: [phone] [email or web address]

## 2021-02-23 NOTE — ED Notes (Signed)
Pt's mother updated on POC.

## 2021-02-23 NOTE — ED Notes (Signed)
Lunch provided.

## 2021-02-23 NOTE — ED Notes (Signed)
Pt began to wave her arm back and forth stating that she is having a seizure. Pt tearful and yelling someone save me and that she needs her mother.

## 2021-02-23 NOTE — ED Provider Notes (Signed)
°  Physical Exam  BP 109/70    Pulse 95    Temp 98.3 F (36.8 C) (Oral)    Resp 17    Ht 5\' 6"  (1.676 m)    Wt 78.5 kg    SpO2 98%    BMI 27.92 kg/m   Physical Exam  ED Course/Procedures   Clinical Course as of 02/23/21 1702  Sun Feb 22, 2021  1333 Requested [facility] consultation to help with disposition [EW]    Clinical Course User Index [EW] Daleen Bo, MD    Procedures  MDM  I was called by the nurse because patient's INR is 6.5.  It was 6.7 yesterday.  Patient is on Coumadin for previous DVT.  I ordered vitamin K and have pharmacy dose her Coumadin level and get serial INRs.  Her Keppra level was 100.  I discussed case with Dr. Leonel Ramsay from neurology.  He states that patient was recently seen by neurology inpatient. He states that the keppra level is stable and that patient doesn't need to change the dose of Keppra again discussed the case with her neurologist outpatient.  Will need placement at SNF and still pending placement     Drenda Freeze, MD 02/23/21 1714

## 2021-02-23 NOTE — ED Notes (Signed)
Pt mouth swabbed with oral rinse.

## 2021-02-24 ENCOUNTER — Ambulatory Visit: Payer: Medicare Other | Admitting: Neurology

## 2021-02-24 DIAGNOSIS — G40909 Epilepsy, unspecified, not intractable, without status epilepticus: Secondary | ICD-10-CM | POA: Diagnosis not present

## 2021-02-24 LAB — RESP PANEL BY RT-PCR (FLU A&B, COVID) ARPGX2
Influenza A by PCR: NEGATIVE
Influenza B by PCR: NEGATIVE
SARS Coronavirus 2 by RT PCR: NEGATIVE

## 2021-02-24 LAB — PROTIME-INR
INR: 2.3 — ABNORMAL HIGH (ref 0.8–1.2)
Prothrombin Time: 25.3 seconds — ABNORMAL HIGH (ref 11.4–15.2)

## 2021-02-24 MED ORDER — WARFARIN SODIUM 7.5 MG PO TABS
7.5000 mg | ORAL_TABLET | Freq: Once | ORAL | Status: DC
  Filled 2021-02-24: qty 1

## 2021-02-24 MED ORDER — WARFARIN - PHARMACIST DOSING INPATIENT: Freq: Every day | Status: DC

## 2021-02-24 NOTE — ED Notes (Signed)
Physical Therapy in room w/ pt.

## 2021-02-24 NOTE — ED Notes (Signed)
[facility] called, ride arranged.  ETA unknown for [facility] arrival.

## 2021-02-24 NOTE — ED Notes (Signed)
Pt repositioned in bed by ed RN and sitter.

## 2021-02-24 NOTE — ED Notes (Addendum)
[facility] transport here to take pt to [facility].  Pt transferred to stretcher and wheeled out of ed by [employer].  All belongings sent w/ [facility] to be transported w/ pt.  DC instructions sent w/ [facility] and pt to [facility].  [facility] Staff called and notified pt enroute to facility.  Pt's mother also called and notified pt enroute to facility.

## 2021-02-24 NOTE — Progress Notes (Signed)
Saved light green and lavender tube in main lab

## 2021-02-24 NOTE — Progress Notes (Signed)
ANTICOAGULATION CONSULT NOTE - Follow Up Consult  Pharmacy Consult for Warfarin Indication:  history of VTE  Allergies  Allergen Reactions   Vicodin [Hydrocodone-Acetaminophen] Nausea And Vomiting   Morphine And Related Rash    Allergic reaction only in iv form    Patient Measurements: Height: 5\' 6"  (167.6 cm) Weight: 78.5 kg (173 lb) IBW/kg (Calculated) : 59.3 kg  Vital Signs: Temp: 97.6 F (36.4 C) (01/03 0409) Temp Source: Axillary (01/03 0409) BP: 103/61 (01/03 0409) Pulse Rate: 87 (01/03 0409)  Labs: Recent Labs    02/22/21 1333 02/23/21 1411 02/24/21 0402  HGB 9.9*  --   --   HCT 31.3*  --   --   PLT 276  --   --   LABPROT 58.0* 56.9* 25.3*  INR 6.7* 6.5* 2.3*  CREATININE 1.09*  --   --      Estimated Creatinine Clearance: 62.4 mL/min (A) (by C-G formula based on SCr of 1.09 mg/dL (H)).   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     Last dose of warfarin was 1/1 at 0915 per med rec 1/1 and 1/2 INR supratherapeutic.   1/2 1729 phytonadione 5 mg po x 1 administered 1/3 INR 2.3, no warfarin administered yesterday   Goal of Therapy:  INR 2-3 Monitor platelets by anticoagulation protocol: Yes   Plan:  Warfarin 7.5 mg po x 1 today Obtain a daily PT/INR (for daily warfarin dose) and Q72H CBC Monitor for signs and symptoms of bleeding  Thank you Royetta Asal, PharmD, [place] Please utilize Amion for appropriate phone number to reach the unit pharmacist ([facility]) 02/24/2021 11:01 AM

## 2021-02-24 NOTE — Progress Notes (Signed)
Pt PASRR #[id number] E  Gwen Sarvis M.Madgie Dhaliwal, MSW, [place]   Transitions of Care Clinical Social Worker I Direct Dial: [phone]   Fax: [phone] [email or web address]

## 2021-02-24 NOTE — Discharge Planning (Signed)
RNCM spoke with Cassandra Andrade at [facility] to advise of neg COVID test.  RNCM advised bedside RN: room # 130  report can be called [phone]. Bedside RN will call [facility] for transport to [facility] at discharge.

## 2021-02-24 NOTE — ED Notes (Signed)
Report called to [facility]. Report given to [facility] LPN at [facility].

## 2021-02-24 NOTE — ED Notes (Signed)
Pt's mother informed pt will be going to [facility]. Pt's mother requesting to be called when pt leaves.  ED RN agreed to call mother when pt transported out of ed.

## 2021-02-24 NOTE — ED Notes (Signed)
Pt broke her grey necklace.  Necklace placed in urine cup, labeled w/ pt label and placed in pt's locker w/ rest of her belongings.

## 2021-02-24 NOTE — Progress Notes (Signed)
Physical Therapy Treatment Patient Details Name: Cassandra Andrade MRN: [id number] DOB: [birth year] Today's Date: 02/24/2021   History of Present Illness This 56 y.o. female admitted from home 02/22/21 with unwitnessed seizure and confusion.  PMH includes: seizure disorder with 2-3 sz/week, h/o stroke related to prior aneurysm rupsture, h/o DVT, h/o Rt LE osteosarcoma, s/p Rt AKA    PT Comments    The patient  moves slowly but able to mobilize to sitting upright  and move LLE over bed edge and use both UE's to bed edge. Patient  able to return to sitting and scoot up to [facility] using both UE's . Patient reports that she  moves around scooting on the floor at home.   Recommendations for follow up therapy are one component of a multi-disciplinary discharge planning process, led by the attending physician.  Recommendations may be updated based on patient status, additional functional criteria and insurance authorization.  Follow Up Recommendations  Skilled nursing-short term rehab (<3 hours/day)     Assistance Recommended at Discharge Frequent or constant Supervision/Assistance  Patient can return home with the following     Equipment Recommendations  None recommended by PT    Recommendations for Other Services       Precautions / Restrictions Precautions Precaution Comments: seziures; prior R AKA     Mobility  Bed Mobility   Bed Mobility: Supine to Sit;Sit to Supine     Supine to sit: Min guard Sit to supine: Min guard   General bed mobility comments: extra time to  mobilize self  to bed edge. Patient able to   sit upright and use B UE's to scoot buttocks to bed edge. Min guard  and extra time to get the left leg onto bed.Patient able to use both UE's to scoot toward head of bed, taking extra time.    Transfers                   General transfer comment: pt. reports that she scoots around on the floor at home.    Ambulation/Gait                   Stairs              Wheelchair Mobility    Modified Rankin (Stroke Patients Only)       Balance   Sitting-balance support: No upper extremity supported;Feet supported Sitting balance-Leahy Scale: Fair Sitting balance - Comments: tends to have posterior bias, gradually leans back but is able to  maintain balance to drink OJ.                                    Cognition Arousal/Alertness: Awake/alert   Overall Cognitive Status: No family/caregiver present to determine baseline cognitive functioning                                 General Comments: Pt very soft spoken, requireing multimodal cues for transfers and safety, decreased awareness of safety and current deficits, constantly speaking, difficulty understnadiong. Able to follow simple directions with increased time        Exercises      General Comments        Pertinent Vitals/Pain Faces Pain Scale: No hurt    Home Living  Prior Function            PT Goals (current goals can now be found in the care plan section) Progress towards PT goals: Progressing toward goals    Frequency    Min 2X/week      PT Plan Current plan remains appropriate    Co-evaluation              AM-PAC PT "6 Clicks" Mobility   Outcome Measure  Help needed turning from your back to your side while in a flat bed without using bedrails?: A Lot Help needed moving from lying on your back to sitting on the side of a flat bed without using bedrails?: A Lot Help needed moving to and from a bed to a chair (including a wheelchair)?: Total Help needed standing up from a chair using your arms (e.g., wheelchair or bedside chair)?: Total Help needed to walk in hospital room?: Total Help needed climbing 3-5 steps with a railing? : Total 6 Click Score: 8    End of Session   Activity Tolerance: Patient tolerated treatment well Patient left: in bed;with call bell/phone within  reach;with chair alarm set Nurse Communication: Mobility status PT Visit Diagnosis: Unsteadiness on feet (R26.81);Muscle weakness (generalized) (M62.81);History of falling (Z91.81);Difficulty in walking, not elsewhere classified (R26.2);Other symptoms and signs involving the nervous system (X83.291)     Time: 9166-0600 PT Time Calculation (min) (ACUTE ONLY): 22 min  Charges:  $Therapeutic Activity: 8-22 mins                     Tresa Endo PT Acute Rehabilitation Services Pager [phone] Office [phone]    Claretha Cooper 02/24/2021, 9:40 AM

## 2021-02-24 NOTE — ED Notes (Signed)
Pt changed and new brief placed on pt prior to transport to [facility]

## 2021-02-24 NOTE — ED Provider Notes (Addendum)
Emergency Medicine Observation Re-evaluation Note  Dove Glotfelty is a 56 y.o. female, seen on rounds today.  Pt initially presented to the ED for complaints of Seizures Currently, the patient is tearful.  Physical Exam  BP 103/61 (BP Location: Left Arm)    Pulse 87    Temp 97.6 F (36.4 C) (Axillary)    Resp 20    Ht 5\' 6"  (1.676 m)    Wt 78.5 kg    SpO2 100%    BMI 27.92 kg/m  Physical Exam General: Tearful, but redirectable Cardiac: Well perfused Lungs: Even and unlabored Psych: Tearful, but redirectable  ED Course / MDM  EKG:   I have reviewed the labs performed to date as well as medications administered while in observation.  Recent changes in the last 24 hours include Pt INR yesterday was elevated but down-trending to 6.5 from 6.7 yesterday.  She is on Coumadin.  Vitamin K was ordered and pharmacy has been consulted yesterday for dosing of her Coumadin.  Her INR this morning is therapeutic at 2.3.  Physical therapy has evaluated the patient this morning and is working with the patient.  Physical therapy did recommend skilled nursing short-term rehab.  Plan  Current plan is for social work still working on patient placement. Pt discharged to [facility].  Monasia Plaia is not under involuntary commitment.     Regan Lemming, MD 02/24/21 1000    Regan Lemming, MD 02/24/21 1253

## 2021-03-05 ENCOUNTER — Ambulatory Visit (HOSPITAL_COMMUNITY): Payer: Medicare Other | Admitting: Licensed Clinical Social Worker

## 2021-03-05 ENCOUNTER — Other Ambulatory Visit: Payer: Self-pay

## 2021-03-10 ENCOUNTER — Ambulatory Visit (HOSPITAL_COMMUNITY): Payer: Medicare Other | Admitting: Licensed Clinical Social Worker

## 2021-03-10 ENCOUNTER — Other Ambulatory Visit: Payer: Self-pay

## 2021-03-10 ENCOUNTER — Ambulatory Visit (INDEPENDENT_AMBULATORY_CARE_PROVIDER_SITE_OTHER): Payer: Medicare Other | Admitting: Neurology

## 2021-03-10 ENCOUNTER — Encounter: Payer: Self-pay | Admitting: Neurology

## 2021-03-10 VITALS — BP 119/76 | HR 80

## 2021-03-10 DIAGNOSIS — R269 Unspecified abnormalities of gait and mobility: Secondary | ICD-10-CM | POA: Diagnosis not present

## 2021-03-10 DIAGNOSIS — G40919 Epilepsy, unspecified, intractable, without status epilepticus: Secondary | ICD-10-CM

## 2021-03-10 NOTE — Progress Notes (Signed)
PATIENT: Cassandra Andrade DOB: 06/12/1965  REASON FOR VISIT: follow up HISTORY FROM: patient PRIMARY NEUROLOGIST: Dr. April Manson  HISTORY OF PRESENT ILLNESS: Today 03/11/21: Patient presents today, accompanied by son for seizure follow up. At last visit, in October, plan was to start Clobazam at a lower dose since seizures were not controlled. Since then she has been admitted twice for seizures and AMS.  Her antiseizure medication has been uptitrated, her lamotrigine increased to 350 twice daily and her Onfi increased to 10 mg at night.  Per son the last seizure was on January 1 Currently she is at rehab, doing well, denies any additional seizures.  Son reported patient get easily upset, cries a lot, she does have a psychiatrist and a therapist but has not follow-up with them.  Her last Keppra level was 101, her last lamotrigine level was 17.9.   INTERVAL HISTORY 12/15/2020: Patient presents today for follow-up with mother.  Her seizure frequency is currently twice a week.  Mother is using Nayzilam for the seizures, she think that the VNS is not working appropriately as patient is wiping but seizure continues.  She is currently on Lamictal 300 mg twice a day, Vimpat 200 mg twice a day and Keppra 1500 mg in the morning and 2000 mg in the evening.  Mother try 5 mg of Onfi in the past and believe that patient had reacted badly on it (induce seizures) and has not given other medical again.     INTERVAL HISTORY 11/12/2020 Cassandra Andrade is a 56 year old female with a history of a stroke related to her prior aneurysm rupture and intractable seizures.  The patient continues to have 1-2 seizure events a week.  She does have a vagal nerve stimulator and tries to use her magnet if she feels an event about to occur.  She states that she typically feels like she is "losing control" before seizure starts.  She typically will try to use her magnet and that will decrease the severity of a seizure.  She remains on Lamictal  300 mg twice a day, Vimpat 200 mg twice a day Keppra 1500 mg in the morning and 2000 mg in evening.  She was prescribed Onfi 5 mg daily however she has not started this.  Patient's mother states that they were concerned about potential side effects.    HISTORY (copied from Dr. Tobey Grim note) Cassandra Andrade is a 56 year old right-handed white female with a history of a stroke related to a prior aneurysm rupture. The patient has a right above-knee amputation, she has a prosthesis for this but she believes that her ability to walk has declined somewhat over the last several months, she has not had any further falls.  She tries to do exercises.  The patient is still having relatively frequent seizures, but most of her seizures she can abort with the vagal nerve stimulator. She returns for further evaluation.  She remains on Lamictal, Vimpat, and Keppra.  She tolerates medications well.  REVIEW OF SYSTEMS: Out of a complete 14 system review of symptoms, the patient complains only of the following symptoms, and all other reviewed systems are negative.  ALLERGIES: Allergies  Allergen Reactions   Vicodin [Hydrocodone-Acetaminophen] Nausea And Vomiting   Morphine And Related Rash    Allergic reaction only in iv form    HOME MEDICATIONS: Outpatient Medications Prior to Visit  Medication Sig Dispense Refill   acetaminophen (TYLENOL) 650 MG CR tablet Take 1,300 mg by mouth every 8 (eight) hours as needed  for pain.     Ascorbic Acid (VITAMIN C) 1000 MG tablet Take 1,000 mg by mouth daily.     cloBAZam (ONFI) 10 MG tablet Take 1 tablet (10 mg total) by mouth at bedtime.     clonazePAM (KLONOPIN) 1 MG disintegrating tablet Take 1 tablet (1 mg total) by mouth 2 (two) times daily as needed for seizure. 30 tablet 0   ketoconazole (NIZORAL) 2 % shampoo Apply 1 application topically 2 (two) times a week. Monday,wednesday     lacosamide (VIMPAT) 200 MG TABS tablet TAKE 1 TABLET BY MOUTH TWICE A DAY (Patient taking  differently: Take 200 mg by mouth 2 (two) times daily.) 180 tablet 1   lamoTRIgine (LAMICTAL) 100 MG tablet Take 3 tablets (300 mg total) by mouth 2 (two) times daily along with 50 mg 2 (two) times daily Total of 350 mg twice daily (300 mg+ 50 mg) 180 tablet 2   lamoTRIgine (LAMICTAL) 25 MG tablet Take 2 tablets (50 mg total) by mouth 2 (two) times daily along with 300 mg 2 (two) times daily. Total of 350 mg twice daily (300 mg+ 50 mg) 60 tablet 0   levETIRAcetam (KEPPRA) 500 MG tablet Take 3 tabs (1500 mg) in the morning and 4 tabs (2000 mg) nightly. 210 tablet 0   levothyroxine (SYNTHROID, LEVOTHROID) 50 MCG tablet Take 50 mcg by mouth every evening.     omeprazole (PRILOSEC) 20 MG capsule Take 20 mg by mouth at bedtime.     Prenatal Vit-Fe Fumarate-FA (PRENATAL MULTIVITAMIN) TABS tablet Take 1 tablet by mouth daily.     promethazine (PHENERGAN) 25 MG tablet Take 25 mg by mouth every 8 (eight) hours as needed for nausea or vomiting.     venlafaxine XR (EFFEXOR-XR) 150 MG 24 hr capsule Take 1 capsule (150 mg total) by mouth daily with breakfast. 90 capsule 0   warfarin (COUMADIN) 5 MG tablet Take 1.5-2 tablets (7.5-10 mg total) by mouth See admin instructions. Pt takes one and a half tablets (7.5mg  total) on Sunday and Wednesday and 2 tablets (10mg  total) all other days. (Patient taking differently: Take 7.5-10 mg by mouth See admin instructions. 5 mg Friday, Monday 7.5 mg Saturday,Sunday)     No facility-administered medications prior to visit.    PAST MEDICAL HISTORY: Past Medical History:  Diagnosis Date   Anxiety    Arthritis    Cancer ([place])    Osteosarcoma, right leg   CVA (cerebrovascular accident due to intracerebral hemorrhage) ([place]) 2004   Depression    DVT (deep venous thrombosis) ([facility])    Left leg   MRSA (methicillin resistant Staphylococcus aureus)    S/P BKA (below knee amputation) unilateral ([facility])    Right    Seizures ([place])    Thyroid disease    Unspecified epilepsy with  intractable epilepsy 02/28/2013    PAST SURGICAL HISTORY: Past Surgical History:  Procedure Laterality Date   ABOVE KNEE LEG AMPUTATION Right    Osteosarcoma   BRAIN SURGERY  2004   cerebral aneurysm   FRACTURE SURGERY     Rt. femur, folllowed by MRSA   TOTAL KNEE ARTHROPLASTY Right     FAMILY HISTORY: Family History  Problem Relation Age of Onset   Anxiety disorder Mother    Diabetes Father    Seizures Neg Hx     SOCIAL HISTORY: Social History   Socioeconomic History   Marital status: Divorced    Spouse name: Not on file   Number of children: 3  Years of education: GED   Highest education level: Not on file  Occupational History   Not on file  Tobacco Use   Smoking status: Never   Smokeless tobacco: Never   Tobacco comments:    never used tobacco  Vaping Use   Vaping Use: Never used  Substance and Sexual Activity   Alcohol use: No    Alcohol/week: 0.0 standard drinks   Drug use: No   Sexual activity: Not Currently  Other Topics Concern   Not on file  Social History Narrative   Patient lives at home with mom.    Patient has 3 children.    Drinks no caffeine   Right Handed         Social Determinants of Health   Financial Resource Strain: Not on file  Food Insecurity: Not on file  Transportation Needs: Not on file  Physical Activity: Not on file  Stress: Not on file  Social Connections: Not on file  Intimate Partner Violence: Not on file     PHYSICAL EXAM  Vitals:   03/10/21 1347  BP: 119/76  Pulse: 80    There is no height or weight on file to calculate BMI.  Generalized: Well developed, in no acute distress   Neurological examination  Mentation: Alert oriented to time, place, history taking. Follows all commands speech and language fluent Cranial nerve II-XII: Pupils were equal round reactive to light. Extraocular movements were full, visual field were full on confrontational test. Facial sensation and strength were normal. Uvula  tongue midline. Head turning and shoulder shrug  were normal and symmetric. Motor: The motor testing reveals 5 over 5 strength of all 4 extremities.  Above-the-knee amputation on the right. Sensory: Sensory testing is intact to soft touch on all 4 extremities. No evidence of extinction is noted.  Coordination: Cerebellar testing reveals good finger-nose-finger and heel-to-shin bilaterally.  Gait and station: Patient is in a wheelchair   DIAGNOSTIC DATA (LABS, IMAGING, TESTING) - I reviewed patient records, labs, notes, testing and imaging myself where available.  Lab Results  Component Value Date   WBC 6.4 02/22/2021   HGB 9.9 (L) 02/22/2021   HCT 31.3 (L) 02/22/2021   MCV 91.3 02/22/2021   PLT 276 02/22/2021      Component Value Date/Time   NA 137 02/22/2021 1333   NA 143 11/12/2020 1210   NA 141 01/12/2017 1125   K 3.1 (L) 02/22/2021 1333   K 4.5 01/12/2017 1125   CL 104 02/22/2021 1333   CL 97 (L) 07/11/2013 1346   CO2 28 02/22/2021 1333   CO2 30 (H) 01/12/2017 1125   GLUCOSE 96 02/22/2021 1333   GLUCOSE 79 01/12/2017 1125   GLUCOSE 126 (H) 07/11/2013 1346   BUN 21 (H) 02/22/2021 1333   BUN 20 11/12/2020 1210   BUN 11.2 01/12/2017 1125   CREATININE 1.09 (H) 02/22/2021 1333   CREATININE 0.98 01/11/2018 1116   CREATININE 0.9 01/12/2017 1125   CALCIUM 9.1 02/22/2021 1333   CALCIUM 10.1 01/12/2017 1125   PROT 6.3 (L) 02/15/2021 0350   PROT 7.0 11/12/2020 1210   PROT 7.8 01/12/2017 1125   ALBUMIN 3.3 (L) 02/15/2021 0350   ALBUMIN 4.5 11/12/2020 1210   ALBUMIN 4.0 01/12/2017 1125   AST 22 02/15/2021 0350   AST 23 01/11/2018 1116   AST 34 01/12/2017 1125   ALT 23 02/15/2021 0350   ALT 22 01/11/2018 1116   ALT 65 (H) 01/12/2017 1125   ALKPHOS 89 02/15/2021 0350  ALKPHOS 105 01/12/2017 1125   BILITOT 0.5 02/15/2021 0350   BILITOT <0.2 11/12/2020 1210   BILITOT 0.2 (L) 01/11/2018 1116   BILITOT 0.23 01/12/2017 1125   GFRNONAA >60 02/22/2021 1333   GFRNONAA >60  01/11/2018 1116   GFRAA 69 03/24/2020 1324   GFRAA >60 01/11/2018 1116   No results found for: CHOL, HDL, LDLCALC, LDLDIRECT, TRIG, CHOLHDL Lab Results  Component Value Date   HGBA1C (L) 12/26/2009    4.3 (NOTE)                                                                       According to the ADA Clinical Practice Recommendations for 2011, when HbA1c is used as a screening test:   >=6.5%   Diagnostic of Diabetes Mellitus           (if abnormal result  is confirmed)  5.7-6.4%   Increased risk of developing Diabetes Mellitus  References:Diagnosis and Classification of Diabetes Mellitus,Diabetes TKWI,0973,53(GDJME 1):S62-S69 and Standards of Medical Care in         Diabetes - 2011,Diabetes QAST,4196,22  (Suppl 1):S11-S61.   Lab Results  Component Value Date   WLNLGXQJ19 417 02/11/2010   Lab Results  Component Value Date   TSH 2.417 02/17/2013    VNS Interrogated today:  Output current   2 mA Frequency   30 Hz  Pulse Width    250 usec On time    21 sec Off time    1.1 min  Duty Cycle    29 %     Magnet Mode:  Output current   2.25 mA Pulse Width   250 usec  On time    60 sec   EEG 02/16/2021 This study is consistent with patient's known history of epilepsy arising from right frontal region. There is also cortical dysfunction in right frontal consistent with prior craniotomy. No seizures were seen throughout the recording.   MRI 02/18/2021 1. No acute intracranial abnormality or significant interval change. 2. Chronic right MCA encephalomalacia. 3. Study was incomplete due to severe patient motion.  ASSESSMENT AND PLAN 57 y.o. year old female  has a past medical history of Anxiety, Arthritis, Cancer ([place]), CVA (cerebrovascular accident due to intracerebral hemorrhage) ([place]) (2004), Depression, DVT (deep venous thrombosis) ([place]), MRSA (methicillin resistant Staphylococcus aureus), S/P BKA (below knee amputation) unilateral ([place]), Seizures ([place]), Thyroid disease, and  Unspecified epilepsy with intractable epilepsy (02/28/2013). here with :  Intractable seizures  Continue Lamictal 350 mg twice a day Continue Vimpat 200 mg twice a day Continue Keppra 1500 mg in the morning and 2000 mg at night Continue with Onfi 10 mg nightly  Another  magnet given to patient, VNS interrogated today, no changes made Prescribed Klonopin to use as needed if she feels the seizures are coming. Can take an extra dose if needed.  Continue with physical therapy  Follow up in 3 months.      Hollace Kinnier, MD 03/11/2021, 7:53 AM [facility] [address] [place], [place] [address] [phone]

## 2021-03-10 NOTE — Patient Instructions (Signed)
Continue with current medications   Lacosamide 200 mg BID   Lamotrigine 350 mg BID   Levetiracetam 1500 mg AM and 2000 mg PM  Clobazam 10 mg PM  VNS interrogated today, no changes made. Patient was given a new magnet (Lost her previous one)  Continue with physical therapy  Follow up in 3 months or sooner if worse

## 2021-03-18 ENCOUNTER — Telehealth: Payer: Self-pay | Admitting: Neurology

## 2021-03-18 NOTE — Telephone Encounter (Signed)
Katie from [facility](for nursing) is at home with pt & mother.  She is asking to speak with someone re: concerns mother has about spells pt has re: aggression and confusion.   Joellen Jersey said that either she can be called back or pt's mother can be called, and she can give more details re: there being an increase in pt becoming aggressive and moments of confusion.

## 2021-03-18 NOTE — Telephone Encounter (Signed)
I returned the call to the home health nurse. She made her first visit to the patient today. Her mother reported the patient having more frequent events of agitation, confusion, combativeness. Says the severity is to the point the mother has to sit there and let it happen or medicate her. Not able to control her. This morning, the mother gave her clonazepam 1mg  which in turned caused the patient to fall asleep. The mother asked for an increase in the clonazepam dosage. She is taking all other antiepileptic medications as prescribed. The mother feels these events are presenting different than her normal seizures. She also has a psychiatric history and it was reported at last visit that she has not followed up with her psychiatrist.  Joellen Jersey would like to know if any changes to treatment need to be made from a neurological standpoint. Or if they need to seek psychiatric care. She would like a call back with guidance.

## 2021-03-19 NOTE — Telephone Encounter (Signed)
Pt's mother, Ronita Hipps, pt has become combative. Mother has been wrestling with pt to hold her down (holding her wrist) to prevent her from hurting herself. Pt kicking and scratching her mother. Would like a call from the nurse to discuss a medication to keep her calm.  If can send [id number].

## 2021-03-23 NOTE — Telephone Encounter (Signed)
Spoke with Ronita Hipps 1/26, all questions answered. Advised them to follow up with psychiatry

## 2021-03-24 ENCOUNTER — Telehealth (HOSPITAL_COMMUNITY): Payer: Self-pay

## 2021-03-24 ENCOUNTER — Telehealth (HOSPITAL_COMMUNITY): Payer: Self-pay | Admitting: Psychiatry

## 2021-03-24 MED ORDER — OLANZAPINE 5 MG PO TBDP: 5.0000 mg | ORAL_TABLET | Freq: Every day | ORAL | 0 refills | Status: DC | PRN

## 2021-03-24 NOTE — Telephone Encounter (Signed)
I spoke to patient's mother who is very concerned about her daughter's behavior.  She was last evaluated on December 8 th ans she was stable on Venlafaxine. Since than she hospitalized and had a ED visit because of stroke and seizure-like symptoms.  Recently seen by neurologist who adjusted her seizure medicine.  Mother is concerned since started the new medication patient is acting bizarre.  She is talking to herself, agitated, confused having visual hallucinations. Mother is concerned that she read the side effects of the new medication and believes since started that medication symptoms started to appear.  I also talked to the patient who admitted that she is seeing people and some time confused.  She contracts for safety but like to have the symptoms to be addressed.  Her new medication started by neurologist is Clobazam.  She is also taking Klonopin and dose has been increased due to recent behavior.  Patient has not seen a significant and improvement.  The mother called the neurologist who recommended to call psychiatrist because of the mood symptoms. I review current medication.  This is a new symptoms and patient has no h/o psychosis. Though I recommended to try Zydis 5 mg to help her agitation but also strongly suggest talk to neurologist about new antiseizure medicine which may be causing side effects.  Mother wants me to message the neurologist . I recommend if symptoms do not improve then she may need a psychiatric evaluation at behavioral health urgent care.  I provided address and other information if she decided to go there.  I also recommend anytime having suicidal thoughts or homicidal ideations than need to call 911. We will provide Zydis 5 mg to take as needed for severe agitation #20.  I will also forward my note to her neurologist.

## 2021-03-24 NOTE — Telephone Encounter (Signed)
Spoke with mother, advised her to decrease Clobazam to 1/2 for 2 days then to discontinue it. She will continue the Klonopin as directed and contact us for any changes.   Dr. April Manson

## 2021-03-24 NOTE — Telephone Encounter (Signed)
error 

## 2021-03-24 NOTE — Telephone Encounter (Signed)
CMA Lavena Bullion from [facility] called and said that the patient's mother called them, but they are not sure what Dr. April Manson is wanting them to do for the patient. She asked for a call back on how to proceed. Her direct number is [phone].

## 2021-03-24 NOTE — Telephone Encounter (Signed)
Left a message for Cassandra Andrade stating that I have patient to follow up with psychiatry because she has not done so and son has been reported depressed mood, crying a lot and being upset easily.

## 2021-04-01 ENCOUNTER — Other Ambulatory Visit: Payer: Self-pay

## 2021-04-01 ENCOUNTER — Encounter (HOSPITAL_COMMUNITY): Payer: Self-pay | Admitting: Licensed Clinical Social Worker

## 2021-04-01 ENCOUNTER — Ambulatory Visit (INDEPENDENT_AMBULATORY_CARE_PROVIDER_SITE_OTHER): Payer: Medicare Other | Admitting: Licensed Clinical Social Worker

## 2021-04-01 DIAGNOSIS — F330 Major depressive disorder, recurrent, mild: Secondary | ICD-10-CM | POA: Diagnosis not present

## 2021-04-01 NOTE — Progress Notes (Signed)
Virtual Visit via Telephone Note  I connected with Cassandra Andrade on 04/01/21 at  3:30 PM EST by telephone and verified that I am speaking with the correct person using two identifiers.  Location: Patient: home Provider: home office   I discussed the limitations, risks, security and privacy concerns of performing an evaluation and management service by telephone and the availability of in person appointments. I also discussed with the patient that there may be a patient responsible charge related to this service. The patient expressed understanding and agreed to proceed.  Type of Therapy: Individual Therapy   Treatment Goals addressed:"I want to maintain my happy mood, positive outlook on life, and to stay motivated to walk". Cassandra Andrade will report ongoing positive mood and positive outlook on life 6 out of 7 days per self report.   Interventions: Motivational Interviewing   Summary: Cassandra Andrade is a 56 y.o. female who presents with Major Depressive Disorder, recurrent, mild   Suicidal/Homicidal: No -without intent/plan   Therapist Response: Cassandra Andrade met with clinician for an individual session. Cassandra Andrade discussed her psychiatric symptoms, her current life events and her homework. Cassandra Andrade shared that she is finally out of jail. Cassandra Andrade shared that she had been in a rehab facility for about 21 days, which was a horrible experience. Clinician explored the experience and identified the joy and comfort of being back at home. Clinician explored thoughts and feelings about her health and noted concerns that this ty pe of episode could happen again. Mother reported that Cassandra Andrade was no officially dx with stroke, but that her speech was slurry and was very difficult to understand. Clinician explored current status and noted that she was very tired after having a seizure earlier today. Clinician explored the cause of the seizures and noted no answer. Mom explored options for a day program so Cassandra Andrade can socialize  more often. Clinician provided contact info for [facility] [employer] and [facility].    Plan: Return again in 4 weeks.   Diagnosis: Axis I: Major Depressive Disorder, recurrent, mild      I discussed the assessment and treatment plan with the patient. The patient was provided an opportunity to ask questions and all were answered. The patient agreed with the plan and demonstrated an understanding of the instructions.   The patient was advised to call back or seek an in-person evaluation if the symptoms worsen or if the condition fails to improve as anticipated.  I provided 40 minutes of non-face-to-face time during this encounter.   [name], LCSW

## 2021-04-04 ENCOUNTER — Other Ambulatory Visit (HOSPITAL_COMMUNITY): Payer: Self-pay | Admitting: Psychiatry

## 2021-04-04 DIAGNOSIS — F419 Anxiety disorder, unspecified: Secondary | ICD-10-CM

## 2021-04-04 DIAGNOSIS — F321 Major depressive disorder, single episode, moderate: Secondary | ICD-10-CM

## 2021-04-09 ENCOUNTER — Ambulatory Visit (INDEPENDENT_AMBULATORY_CARE_PROVIDER_SITE_OTHER): Payer: Medicare Other | Admitting: Licensed Clinical Social Worker

## 2021-04-09 ENCOUNTER — Other Ambulatory Visit: Payer: Self-pay

## 2021-04-09 DIAGNOSIS — F330 Major depressive disorder, recurrent, mild: Secondary | ICD-10-CM

## 2021-04-14 ENCOUNTER — Encounter (HOSPITAL_COMMUNITY): Payer: Self-pay | Admitting: Licensed Clinical Social Worker

## 2021-04-14 NOTE — Progress Notes (Signed)
Virtual Visit via Telephone Note  I connected with Jackalynn Obar on 04/14/21 at 11:00 AM EST by telephone and verified that I am speaking with the correct person using two identifiers.  Location: Patient: home Provider: home office   I discussed the limitations, risks, security and privacy concerns of performing an evaluation and management service by telephone and the availability of in person appointments. I also discussed with the patient that there may be a patient responsible charge related to this service. The patient expressed understanding and agreed to proceed.  Type of Therapy: Individual Therapy   Treatment Goals addressed:"I want to maintain my happy mood, positive outlook on life, and to stay motivated to walk". Kallen will report ongoing positive mood and positive outlook on life 6 out of 7 days per self report.   Interventions: Motivational Interviewing   Summary: Martina Brodbeck is a 56 y.o. female who presents with Major Depressive Disorder, recurrent, mild   Suicidal/Homicidal: No -without intent/plan   Therapist Response: Satomi met with clinician for an individual session. Tieara discussed her psychiatric symptoms, her current life events and her homework. Patrena shared that she continues to make some small progress in her health and movement. However, she reports that she continues to have seizures that will put her back physically. Clinician discussed coping skills for positive attitude and motivation. Clinician utilized MI OARS to reflect and summarize experiences with mother and sons. Clinician also discussed ways to incorporate self-compassion into daily life without feeling sorry for herself or getting upset when she is unable to meet a goal.    Plan: Return again in 4 weeks.   Diagnosis: Axis I: Major Depressive Disorder, recurrent, mild         I discussed the assessment and treatment plan with the patient. The patient was provided an opportunity to ask questions  and all were answered. The patient agreed with the plan and demonstrated an understanding of the instructions.   The patient was advised to call back or seek an in-person evaluation if the symptoms worsen or if the condition fails to improve as anticipated.  I provided 45 minutes of non-face-to-face time during this encounter.   [name], LCSW

## 2021-04-16 ENCOUNTER — Other Ambulatory Visit: Payer: Self-pay | Admitting: Neurology

## 2021-04-16 MED ORDER — CLONAZEPAM 1 MG PO TBDP: 1.0000 mg | ORAL_TABLET | Freq: Two times a day (BID) | ORAL | 0 refills | Status: DC | PRN

## 2021-04-16 NOTE — Telephone Encounter (Signed)
Verify Drug Registry For clonazepam 1 MG disintegrating tablet Last Filled: 03/13/2021 Quantity: 20 tablets for 10 days Last appointment: 03/10/2021 Next appointment: 06/08/2021  Was filled in the interim by Martinique Miller NP on 03/13/2021. [place], [place], [place].

## 2021-04-16 NOTE — Telephone Encounter (Signed)
Pt's mother, Ronita Hipps request refill for clonazePAM (KLONOPIN) 1 MG disintegrating tablet at [id number]

## 2021-04-17 ENCOUNTER — Other Ambulatory Visit: Payer: Self-pay | Admitting: Neurology

## 2021-04-17 DIAGNOSIS — G40919 Epilepsy, unspecified, intractable, without status epilepticus: Secondary | ICD-10-CM

## 2021-04-17 NOTE — Telephone Encounter (Signed)
Pt's mother called stating that the pt is completely out of her seizure medications and that they were called in to the wrong pharmacy. Pt is needing her clonazePAM (KLONOPIN) 1 MG disintegrating tablet [facility] [address]. Pt's mother Ms. Landry Mellow would like the [facility] pharmacy taken out of the pt's chart due to her not working with them any longer. Please call Ms. Landry Mellow once this has been fixed so that she can pick it up right away.

## 2021-04-20 ENCOUNTER — Telehealth (HOSPITAL_COMMUNITY): Payer: Self-pay

## 2021-04-20 MED ORDER — LAMOTRIGINE 25 MG PO TABS: ORAL_TABLET | ORAL | 5 refills | Status: DC

## 2021-04-20 MED ORDER — LACOSAMIDE 200 MG PO TABS: 200.0000 mg | ORAL_TABLET | Freq: Two times a day (BID) | ORAL | 5 refills | Status: DC

## 2021-04-20 MED ORDER — CLONAZEPAM 1 MG PO TBDP: 1.0000 mg | ORAL_TABLET | Freq: Two times a day (BID) | ORAL | 5 refills | Status: DC | PRN

## 2021-04-20 MED ORDER — LAMOTRIGINE 100 MG PO TABS: ORAL_TABLET | ORAL | 5 refills | Status: DC

## 2021-04-20 MED ORDER — OLANZAPINE 5 MG PO TBDP
5.0000 mg | ORAL_TABLET | Freq: Every day | ORAL | 0 refills | Status: DC | PRN
Start: 2021-04-20 — End: 2021-05-11

## 2021-04-20 MED ORDER — LEVETIRACETAM 500 MG PO TABS: ORAL_TABLET | ORAL | 5 refills | Status: DC

## 2021-04-20 NOTE — Telephone Encounter (Signed)
I sent the prescription but she is supposed to take only as needed for agitation and hallucination.

## 2021-04-20 NOTE — Telephone Encounter (Signed)
Patient's mother called requesting a refill on pt's Olanzapine 5mg . [facility] [address] [facility] [place] last time. Patient has a followup appointment scheduled for 05/11/21. Please review and advise. Thank you

## 2021-04-20 NOTE — Telephone Encounter (Signed)
The pharmacy has been updated in the patient's chart and new prescriptions will be sent. I attempted to call her mother back (on [place]). No answer, rang repeatedly. Unable to leave a message.   Last note on 03/10/2021:  Lacosamide 200 mg BID  Lamotrigine 350 mg BID  Levetiracetam 1500 mg AM and 2000 mg PM Clobazam 10 mg PM  Prescribed Klonopin to use as needed if she feels the seizures are coming. Can take an extra dose if needed.

## 2021-04-20 NOTE — Telephone Encounter (Signed)
NOTIFIED PATIENT'S MOTHER REGARDING PT'S OLANZAPINE AND RELAYED MESSAGE

## 2021-04-23 ENCOUNTER — Other Ambulatory Visit: Payer: Self-pay | Admitting: Neurology

## 2021-04-23 NOTE — Telephone Encounter (Signed)
Pt's mother, Ronita Hipps (on [place]) pharmacy so not have the Clonazepam 1 mg. Pharmacy want to know if ok for her get Clonazepam 5 mg. Pt been out of the medication for 3 days. Have started having seizures. Would like a call from the nurse. [id number] ?

## 2021-04-24 MED ORDER — CLONAZEPAM 1 MG PO TABS: ORAL_TABLET | ORAL | 5 refills | Status: DC

## 2021-04-24 NOTE — Telephone Encounter (Signed)
[facility] do not [facility] a clon[facility]epam 1mg  available to the them in an orally disintegrating tablet (rx voided). They are request a new rx for clonazepam 1mg  tablet regular table instead. I called the patient's mother and told her we would discuss it w/ Dr. April Manson. She is aware a new rx will be sent to the pharmacy. ?

## 2021-04-27 ENCOUNTER — Other Ambulatory Visit (HOSPITAL_COMMUNITY): Payer: Self-pay | Admitting: Psychiatry

## 2021-04-27 DIAGNOSIS — F419 Anxiety disorder, unspecified: Secondary | ICD-10-CM

## 2021-04-27 DIAGNOSIS — F321 Major depressive disorder, single episode, moderate: Secondary | ICD-10-CM

## 2021-05-05 ENCOUNTER — Other Ambulatory Visit: Payer: Self-pay

## 2021-05-05 ENCOUNTER — Encounter (HOSPITAL_COMMUNITY): Payer: Self-pay

## 2021-05-05 ENCOUNTER — Emergency Department (HOSPITAL_COMMUNITY)
Admission: EM | Admit: 2021-05-05 | Discharge: 2021-05-05 | Disposition: A | Discharge status: Home / Self Care | Payer: Medicare Other | Attending: Emergency Medicine | Admitting: Emergency Medicine

## 2021-05-05 DIAGNOSIS — Z7901 Long term (current) use of anticoagulants: Secondary | ICD-10-CM | POA: Diagnosis not present

## 2021-05-05 DIAGNOSIS — G40909 Epilepsy, unspecified, not intractable, without status epilepticus: Secondary | ICD-10-CM | POA: Diagnosis not present

## 2021-05-05 DIAGNOSIS — R569 Unspecified convulsions: Secondary | ICD-10-CM

## 2021-05-05 DIAGNOSIS — D72819 Decreased white blood cell count, unspecified: Secondary | ICD-10-CM | POA: Diagnosis not present

## 2021-05-05 LAB — CBC
HCT: 39.4 % (ref 36.0–46.0)
Hemoglobin: 12.3 g/dL (ref 12.0–15.0)
MCH: 28.8 pg (ref 26.0–34.0)
MCHC: 31.2 g/dL (ref 30.0–36.0)
MCV: 92.3 fL (ref 80.0–100.0)
Platelets: 227 10*3/uL (ref 150–400)
RBC: 4.27 MIL/uL (ref 3.87–5.11)
RDW: 13.9 % (ref 11.5–15.5)
WBC: 3.1 10*3/uL — ABNORMAL LOW (ref 4.0–10.5)
nRBC: 0 % (ref 0.0–0.2)

## 2021-05-05 LAB — BASIC METABOLIC PANEL
Anion gap: 9 (ref 5–15)
BUN: 16 mg/dL (ref 6–20)
CO2: 30 mmol/L (ref 22–32)
Calcium: 9.4 mg/dL (ref 8.9–10.3)
Chloride: 99 mmol/L (ref 98–111)
Creatinine, Ser: 1.1 mg/dL — ABNORMAL HIGH (ref 0.44–1.00)
GFR, Estimated: 60 mL/min — ABNORMAL LOW (ref >60)
Glucose, Bld: 95 mg/dL (ref 70–99)
Potassium: 3.6 mmol/L (ref 3.5–5.1)
Sodium: 138 mmol/L (ref 135–145)

## 2021-05-05 NOTE — ED Triage Notes (Signed)
Patient BIB EMS with c/o seizure. Pt reports hx of "violent" seizures, she states she had a seizure this morning and has now become violent. 2.5 mg versed given en route, 20 g RW ? ?EMS vitals: ?BP 128/74 ?CBG 111 ?RR 120 ?HR 90  ?SPO2 99% RA ?

## 2021-05-05 NOTE — ED Notes (Signed)
[facility] contacted, patient placed on transport list.  ?

## 2021-05-05 NOTE — ED Provider Notes (Signed)
?[facility] DEPT ?Provider Note ? ? ?CSN: [id number] ?Arrival date & time: 05/05/21  0045 ? ?  ? ?History ? ?Chief Complaint  ?Patient presents with  ? Seizures  ? ? ?Cassandra Andrade is a 56 y.o. female with a history of epilepsy, chronic pain syndrome, prior DVT, and anemia who presents to the emergency department via EMS for a seizure earlier today.  Patient states that she had a seizure earlier that was typical for her procedure activity, she states that it lasted for a bit and she began agitated which is not atypical for her and that she needed medication for this to resolve.  She states that she currently feels fine, she has no complaints.  She states that she lives with her mother who helps with her medical care.  She denies headache, numbness, weakness, change in vision, nausea, vomiting, diarrhea, URI symptoms, chest pain, or abdominal pain.  She states she has been taking her seizure medications as prescribed. ? ?However per EMS to triage patient reported history of violent seizures, she had a seizure earlier in the day and became violent at home requiring 2.5 mg of Versed in route. ? ?I called patient's son & mother in order to obtain additional history - no answer and voicemail not set up for mother.  ? ?HPI ? ?  ? ?Home Medications ?Prior to Admission medications   ?Medication Sig Start Date End Date Taking? Authorizing Provider  ?acetaminophen (TYLENOL) 650 MG CR tablet Take 1,300 mg by mouth every 8 (eight) hours as needed for pain.    [provider]  ?Ascorbic Acid (VITAMIN C) 1000 MG tablet Take 1,000 mg by mouth daily. 08/17/11   [provider]  ?clonazePAM (KLONOPIN) 1 MG tablet One tablet twice daily as needed for seizure. 04/24/21   Alric Ran, MD  ?ketoconazole (NIZORAL) 2 % shampoo Apply 1 application topically 2 (two) times a week. Monday,wednesday 02/05/21   [provider]  ?lacosamide (VIMPAT) 200 MG TABS tablet Take 1 tablet (200 mg  total) by mouth 2 (two) times daily. 04/20/21   Alric Ran, MD  ?lamoTRIgine (LAMICTAL) 100 MG tablet Take 3 tablets (300 mg total) by mouth 2 (two) times daily along with 50 mg 2 (two) times daily Total of 350 mg twice daily (300 mg+ 50 mg) 04/20/21   Alric Ran, MD  ?lamoTRIgine (LAMICTAL) 25 MG tablet Take 2 tablets (50 mg total) by mouth 2 (two) times daily along with 300 mg 2 (two) times daily. Total of 350 mg twice daily (300 mg+ 50 mg) 04/20/21   Alric Ran, MD  ?levETIRAcetam (KEPPRA) 500 MG tablet Take 3 tabs (1500 mg) in the morning and 4 tabs (2000 mg) nightly. 04/20/21   Alric Ran, MD  ?levothyroxine (SYNTHROID, LEVOTHROID) 50 MCG tablet Take 50 mcg by mouth every evening.    [provider]  ?OLANZapine zydis (ZYPREXA ZYDIS) 5 MG disintegrating tablet Take 1 tablet (5 mg total) by mouth daily as needed. 04/20/21   Arfeen, Arlyce Harman, MD  ?omeprazole (PRILOSEC) 20 MG capsule Take 20 mg by mouth at bedtime. 06/12/19   [provider]  ?Prenatal Vit-Fe Fumarate-FA (PRENATAL MULTIVITAMIN) TABS tablet Take 1 tablet by mouth daily.    [provider]  ?promethazine (PHENERGAN) 25 MG tablet Take 25 mg by mouth every 8 (eight) hours as needed for nausea or vomiting. 04/27/19   [provider]  ?venlafaxine XR (EFFEXOR-XR) 150 MG 24 hr capsule TAKE 1 CAPSULE BY MOUTH DAILY  WITH BREAKFAST. 04/30/21   Arfeen, Arlyce Harman, MD  ?warfarin (COUMADIN) 5 MG tablet Take 1.5-2 tablets (7.5-10 mg total) by mouth See admin instructions. Pt takes one and a half tablets (7.'5mg'$  total) on Sunday and Wednesday and 2 tablets ('10mg'$  total) all other days. ?Patient taking differently: Take 7.5-10 mg by mouth See admin instructions. 5 mg Friday, Monday ?7.5 mg Saturday,Sunday 01/03/18   Thurnell Lose, MD  ?   ? ?Allergies    ?Vicodin [hydrocodone-acetaminophen] and Morphine and related   ? ?Review of Systems   ?Review of Systems  ?Constitutional:  Negative for chills and fever.  ?Eyes:  Negative  for visual disturbance.  ?Respiratory:  Negative for shortness of breath.   ?Cardiovascular:  Negative for chest pain.  ?Gastrointestinal:  Negative for abdominal pain, diarrhea, nausea and vomiting.  ?Genitourinary:  Negative for dysuria.  ?Neurological:  Positive for seizures. Negative for weakness, numbness and headaches.  ?All other systems reviewed and are negative. ? ?Physical Exam ?Updated Vital Signs ?BP (!) 125/108   Pulse 99   Temp 98.1 ?F (36.7 ?C) (Oral)   Resp 20   SpO2 100%  ?Physical Exam ?Vitals and nursing note reviewed.  ?HENT:  ?   Head: Normocephalic and atraumatic.  ?Eyes:  ?   Extraocular Movements: Extraocular movements intact.  ?   Pupils: Pupils are equal, round, and reactive to light.  ?Cardiovascular:  ?   Rate and Rhythm: Normal rate and regular rhythm.  ?Pulmonary:  ?   Effort: Pulmonary effort is normal.  ?   Breath sounds: Normal breath sounds.  ?Abdominal:  ?   General: There is no distension.  ?   Palpations: Abdomen is soft.  ?   Tenderness: There is no abdominal tenderness. There is no guarding or rebound.  ?Musculoskeletal:  ?   Cervical back: No tenderness.  ?   Comments: RLE AKA.  ?Skin: ?   General: Skin is warm and dry.  ?Neurological:  ?   Mental Status: She is alert.  ?   Comments: Alert & oriented x 3. No facial droop. Sensation grossly intact x4. Intact grip strength bilaterally.   ?Psychiatric:     ?   Mood and Affect: Mood normal.     ?   Behavior: Behavior normal.  ? ? ?ED Results / Procedures / Treatments   ?Labs ?(all labs ordered are listed, but only abnormal results are displayed) ?Labs Reviewed - No data to display ? ?EKG ?None ? ?Radiology ?No results found. ? ?Procedures ?Procedures  ? ? ?Medications Ordered in ED ?Medications - No data to display ? ?ED Course/ Medical Decision Making/ A&P ?  ?                        ?Medical Decision Making ?Amount and/or Complexity of Data Reviewed ?Labs: ordered. ? ? ?Patient presents to the ED with complaints of seizure,  this involves an extensive number of treatment options, and is a complaint that carries with it a high risk of complications and morbidity. Nontoxic, vitals with intermittent tachycardia, normal HR on my exam. No focal neuro deficits. Patient without complaints at this time.  ? ?Additional history obtained:  ?Chart & nursing note reviewed.  ?EMS report to RN.  ?Attempted to call family as has nursing staff without success.  ? ?Lab Tests:  ?I reviewed & interpreted labs including:  ?CBC: mild leukopenia.  ?BMP: no critical electrolyte derangement.  Creatinine similar to prior. ? ?ED  Course:  ?Labs fairly reassuring, mild leukopenia will need PCP follow up.  ?Patient remains without complaints, observed in the ED without recurrence of seizure activity.  ?Patient requesting to be discharged on multiple reassessments which seems reasonable at this time. Discussed no driving/operating heavy machinery and need for neurology follow up. I discussed results, treatment plan, need for follow-up, and return precautions with the patient. Provided opportunity for questions, patient confirmed understanding and is in agreement with plan.  ? ? ?This is a shared visit with supervising physician Dr. Roxanne Mins who has independently evaluated patient as shared visit- in agreement with care  ? ?Based on patient's chief complaint, I considered admission might be necessary, however after reassuring ED workup feel patient is reasonable for discharge.  ? ? ?Portions of this note were generated with [employer]. Dictation errors may occur despite best attempts at proofreading. ? ?Final Clinical Impression(s) / ED Diagnoses ?Final diagnoses:  ?Seizure ([place])  ?Leukopenia, unspecified type  ? ? ?Rx / DC Orders ?ED Discharge Orders   ? ? None  ? ?  ? ? ?  ?Amaryllis Dyke, PA-C ?05/05/21 4696 ? ?  ?Delora Fuel, MD ?29/52/84 [phone] ? ?  ?Delora Fuel, MD ?40/10/27 [phone] ? ?

## 2021-05-05 NOTE — Discharge Instructions (Addendum)
You were seen in the emergency department this evening for a seizure.  Your labs look similar to prior with the exception that your white blood cell count was mildly low at 3.1, please have this rechecked by your primary care provider within 1 to 2 weeks.  Please return to take your seizure medication as prescribed.  Do not drive or operate heavy machinery until cleared by neurology.  Please call your neurologist today to schedule a close follow-up appointment for soon as possible.  Return to the ER for new or worsening symptoms including but not limited to persistent seizure activity, new or worsening pain, confusion, numbness, weakness, fever, or any other concerns. ?

## 2021-05-06 ENCOUNTER — Other Ambulatory Visit (HOSPITAL_COMMUNITY): Payer: Self-pay | Admitting: Psychiatry

## 2021-05-06 ENCOUNTER — Telehealth (HOSPITAL_COMMUNITY): Payer: Medicare Other | Admitting: Psychiatry

## 2021-05-06 ENCOUNTER — Ambulatory Visit (HOSPITAL_COMMUNITY): Payer: Medicare Other | Admitting: Licensed Clinical Social Worker

## 2021-05-06 NOTE — Progress Notes (Signed)
Attempted to make contact. Mother answered and reported that Cassandra Andrade was "having a little fit", not quite a seizure, but mother was having to hold her hands down to keep her from hurting herself. Clinician could hear Cassandra Andrade making growling noises in the background. Mother reported these episodes have been occurring more frequently.  ?

## 2021-05-07 ENCOUNTER — Telehealth: Payer: Self-pay | Admitting: Neurology

## 2021-05-07 NOTE — Telephone Encounter (Signed)
Spoke with mother [name], advised her to restart Klonopin twice daily. I will bring her in the clinic early next week. Since she is having more period of aggressiveness, will discontinue Keppra and start patient on Briviact, vs Depakote vs. Cenobamate, Trileptal (she is already on 2 Na channel blockers)

## 2021-05-07 NOTE — Telephone Encounter (Signed)
Pt's mother called stating that the pt has become more combative. Pt is hitting herself, hitting her mother and scratching as well. Pt will hit her head to hurt herself and her mother will have to sit on her to keep her from hurting herself or anyone else. Pt's mother [name] (on [place]) states the medications pt is on are not working any longer. Pt was seen in hospital and was given something to calm her down that worked quickly and [name] would like to know if the medications she is on can be changed to something similar. [name] also states that pt is forgetting things more and more everyday and will wake up not knowing where she is and will also start mumbling incoherently and reaching out like if she was trying to grab something. [name] states she does not want the pt to be sleeping all day or put in the nursing home but would like to be advised on what she can do to help the pt. [name] would like to discuss with RN or Provider.  ?

## 2021-05-11 ENCOUNTER — Other Ambulatory Visit (HOSPITAL_COMMUNITY): Payer: Self-pay

## 2021-05-11 ENCOUNTER — Other Ambulatory Visit: Payer: Self-pay | Admitting: Neurology

## 2021-05-11 ENCOUNTER — Telehealth (HOSPITAL_COMMUNITY): Payer: Self-pay

## 2021-05-11 ENCOUNTER — Telehealth (HOSPITAL_BASED_OUTPATIENT_CLINIC_OR_DEPARTMENT_OTHER): Payer: Medicare Other | Admitting: Psychiatry

## 2021-05-11 ENCOUNTER — Other Ambulatory Visit: Payer: Self-pay

## 2021-05-11 ENCOUNTER — Ambulatory Visit: Payer: Medicare Other | Admitting: Neurology

## 2021-05-11 ENCOUNTER — Encounter (HOSPITAL_COMMUNITY): Payer: Self-pay | Admitting: Psychiatry

## 2021-05-11 ENCOUNTER — Other Ambulatory Visit (HOSPITAL_COMMUNITY): Payer: Self-pay | Admitting: Psychiatry

## 2021-05-11 ENCOUNTER — Telehealth: Payer: Self-pay | Admitting: Neurology

## 2021-05-11 VITALS — Wt 173.0 lb

## 2021-05-11 DIAGNOSIS — F321 Major depressive disorder, single episode, moderate: Secondary | ICD-10-CM

## 2021-05-11 DIAGNOSIS — R569 Unspecified convulsions: Secondary | ICD-10-CM

## 2021-05-11 DIAGNOSIS — F419 Anxiety disorder, unspecified: Secondary | ICD-10-CM | POA: Diagnosis not present

## 2021-05-11 MED ORDER — VENLAFAXINE HCL ER 150 MG PO CP24: 150.0000 mg | ORAL_CAPSULE | Freq: Every day | ORAL | 0 refills | Status: DC

## 2021-05-11 MED ORDER — OLANZAPINE 5 MG PO TBDP: ORAL_TABLET | ORAL | 0 refills | Status: DC

## 2021-05-11 MED ORDER — QUETIAPINE FUMARATE 25 MG PO TABS: 25.0000 mg | ORAL_TABLET | Freq: Two times a day (BID) | ORAL | 0 refills | Status: DC

## 2021-05-11 MED ORDER — OLANZAPINE 5 MG PO TBDP: 10.0000 mg | ORAL_TABLET | Freq: Two times a day (BID) | ORAL | 0 refills | Status: DC

## 2021-05-11 MED ORDER — BRIVIACT 50 MG PO TABS: 50.0000 mg | ORAL_TABLET | Freq: Two times a day (BID) | ORAL | 11 refills | Status: DC

## 2021-05-11 NOTE — Telephone Encounter (Signed)
error 

## 2021-05-11 NOTE — Progress Notes (Signed)
Virtual Visit via Video Note ? ?I connected with Cassandra Andrade on 05/11/21 at  2:20 PM EDT by a video enabled telemedicine application and verified that I am speaking with the correct person using two identifiers. ? ?Location: ?Patient: Home ?Provider: Home Office ?  ?I discussed the limitations of evaluation and management by telemedicine and the availability of in person appointments. The patient expressed understanding and agreed to proceed. ? ?History of Present Illness: ?Patient is evaluated by video session.  Her mother provided most of the information.  Patient appears confused and labile.  Her mother reported continued to have multiple seizures and recently her neurologist started her on new medication.  She recently had a visit to the emergency room.  She is now wearing hat, gloves and protective gear as she has episodes of hitting her head with the bed railing.  She also talked to herself.  Her mother reported that she sometime bit herself.  She was given olanzapine 5 mg as needed to control these agitation but mother reported taking too long to calm her down.  She is also on Keppra, Vimpat, Lamictal and Klonopin from neurology.  Mother reported she required some time lot of assistance.  Her daughter helps her a lot.  Patient able to talk but not able to comprehend very well.  She gets sometimes confused and thought process tangential.  She has upcoming appointment with a neurologist.  Patient admitted to feeling sad and frustrated with her condition because she does not remember very well these episodes.  However she denies any active suicidal thoughts.  She admitted sometime seeing things which she describes shadows and gets scared with the dreams and shadows.  Her mother is giving her well approximately milligram daily.  She is eating okay.  She sleeps on and off.  She lying on the bed and able to answer with either yes or no. ? ?Past Psychiatric History:  ?H/O inpatient in 2011 due to overdose on pain  medication. No h/o mania or psychosis.  Tried Zoloft and Remeron but do not remember.  Took Lexapro and Abilify but stopped due to lack of response.  Prozac worked for a long time until it stopped working. ? ?Recent Results (from the past 2160 hour(s))  ?Protime-INR     Status: Abnormal  ? Collection Time: 02/13/21 10:12 PM  ?Result Value Ref Range  ? Prothrombin Time 20.6 (H) 11.4 - 15.2 seconds  ? INR 1.8 (H) 0.8 - 1.2  ?  Comment: (NOTE) ?INR goal varies based on device and disease states. ?Performed at [facility], [place] [address], [place], [place] ?[address] ?  ?APTT     Status: Abnormal  ? Collection Time: 02/13/21 10:12 PM  ?Result Value Ref Range  ? aPTT 38 (H) 24 - 36 seconds  ?  Comment:        ?IF BASELINE aPTT IS ELEVATED, ?SUGGEST PATIENT RISK ASSESSMENT ?BE USED TO DETERMINE APPROPRIATE ?ANTICOAGULANT THERAPY. ?Performed at [facility], [place] [address] [place], [place] [address] ?  ?CBC     Status: Abnormal  ? Collection Time: 02/13/21 10:12 PM  ?Result Value Ref Range  ? WBC 5.0 4.0 - 10.5 K/uL  ? RBC 4.08 3.87 - 5.11 MIL/uL  ? Hemoglobin 11.8 (L) 12.0 - 15.0 g/dL  ? HCT 37.1 36.0 - 46.0 %  ? MCV 90.9 80.0 - 100.0 fL  ? MCH 28.9 26.0 - 34.0 pg  ? MCHC 31.8 30.0 - 36.0 g/dL  ?  RDW 13.0 11.5 - 15.5 %  ? Platelets 168 150 - 400 K/uL  ? nRBC 0.0 0.0 - 0.2 %  ?  Comment: Performed at [facility], [place] [address] [place], [place] [address]  ?Differential     Status: None  ? Collection Time: 02/13/21 10:12 PM  ?Result Value Ref Range  ? Neutrophils Relative % 45 %  ? Neutro Abs 2.3 1.7 - 7.7 K/uL  ? Lymphocytes Relative 44 %  ? Lymphs Abs 2.2 0.7 - 4.0 K/uL  ? Monocytes Relative 8 %  ? Monocytes Absolute 0.4 0.1 - 1.0 K/uL  ? Eosinophils Relative 2 %  ? Eosinophils Absolute 0.1 0.0 - 0.5 K/uL  ? Basophils Relative 1 %  ? Basophils Absolute 0.0 0.0 - 0.1 K/uL  ? Immature Granulocytes 0 %  ? Abs Immature Granulocytes 0.01 0.00 - 0.07 K/uL  ?  Comment: Performed at [facility],  [place] [address], [place], [place] [address]  ?Comprehensive metabolic panel     Status: Abnormal  ? Collection Time: 02/13/21 10:12 PM  ?Result Value Ref Range  ? Sodium 139 135 - 145 mmol/L  ? Potassium 3.6 3.5 - 5.1 mmol/L  ? Chloride 102 98 - 111 mmol/L  ? CO2 30 22 - 32 mmol/L  ? Glucose, Bld 97 70 - 99 mg/dL  ?  Comment: Glucose reference range applies only to samples taken after fasting for at least 8 hours.  ? BUN 13 6 - 20 mg/dL  ? Creatinine, Ser 1.17 (H) 0.44 - 1.00 mg/dL  ? Calcium 9.5 8.9 - 10.3 mg/dL  ? Total Protein 7.0 6.5 - 8.1 g/dL  ? Albumin 3.8 3.5 - 5.0 g/dL  ? AST 27 15 - 41 U/L  ? ALT 27 0 - 44 U/L  ? Alkaline Phosphatase 116 38 - 126 U/L  ? Total Bilirubin 0.5 0.3 - 1.2 mg/dL  ? GFR, Estimated 55 (L) >60 mL/min  ?  Comment: (NOTE) ?Calculated using the CKD-EPI Creatinine Equation (2021) ?  ? Anion gap 7 5 - 15  ?  Comment: Performed at [facility], [place] [address], [place], [place] [address]  ?I-Stat beta hCG blood, ED     Status: None  ? Collection Time: 02/13/21 10:25 PM  ?Result Value Ref Range  ? I-stat hCG, quantitative <5.0 <5 mIU/mL  ? Comment 3          ?  Comment:   GEST. AGE      CONC.  (mIU/mL) ?  <=1 WEEK        5 - 50 ?    2 WEEKS       50 - 500 ?    3 WEEKS       100 - 10,000 ?    4 WEEKS     1,000 - 30,000 ?       ?FEMALE AND NON-PREGNANT FEMALE: ?    LESS THAN 5 mIU/mL ?  ?I-stat chem 8, ED     Status: None  ? Collection Time: 02/13/21 10:27 PM  ?Result Value Ref Range  ? Sodium 141 135 - 145 mmol/L  ? Potassium 3.6 3.5 - 5.1 mmol/L  ? Chloride 102 98 - 111 mmol/L  ? BUN 15 6 - 20 mg/dL  ? Creatinine, Ser 1.00 0.44 - 1.00 mg/dL  ? Glucose, Bld 92 70 - 99 mg/dL  ?  Comment: Glucose reference range applies only to samples taken after fasting for at  least 8 hours.  ? Calcium, Ion 1.18 1.15 - 1.40 mmol/L  ? TCO2 29 22 - 32 mmol/L  ? Hemoglobin 13.3 12.0 - 15.0 g/dL  ? HCT 39.0 36.0 - 46.0 %  ?Resp Panel by RT-PCR (Flu A&B, Covid) Nasopharyngeal Swab     Status: None  ? Collection  Time: 02/13/21 11:24 PM  ? Specimen: Nasopharyngeal Swab; Nasopharyngeal(NP) swabs in vial transport medium  ?Result Value Ref Range  ? SARS Coronavirus 2 by RT PCR NEGATIVE NEGATIVE  ?  Comment: (NOTE) ?SARS-CoV-2 target nucleic acids are NOT DETECTED. ? ?The SARS-CoV-2 RNA is generally detectable in upper respiratory ?specimens during the acute phase of infection. The lowest ?concentration of SARS-CoV-2 viral copies this assay can detect is ?138 copies/mL. A negative result does not preclude SARS-Cov-2 ?infection and should not be used as the sole basis for treatment or ?other patient management decisions. A negative result may occur with  ?improper specimen collection/handling, submission of specimen other ?than nasopharyngeal swab, presence of viral mutation(s) within the ?areas targeted by this assay, and inadequate number of viral ?copies(<138 copies/mL). A negative result must be combined with ?clinical observations, patient history, and epidemiological ?information. The expected result is Negative. ? ?Fact Sheet for Patients:  ?EntrepreneurPulse.com.au ? ?Fact Sheet for Healthcare Providers:  ?IncredibleEmployment.be ? ?This test is no t yet approved or cleared by the [place] FDA and  ?has been authorized for detection and/or diagnosis of SARS-CoV-2 by ?FDA under an Emergency Use Authorization (EUA). This EUA will remain  ?in effect (meaning this test can be used) for the duration of the ?COVID-19 declaration under Section 564(b)(1) of the Act, 21 ?U.S.C.section 360bbb-3(b)(1), unless the authorization is terminated  ?or revoked sooner.  ? ? ?  ? Influenza A by PCR NEGATIVE NEGATIVE  ? Influenza B by PCR NEGATIVE NEGATIVE  ?  Comment: (NOTE) ?The Xpert Xpress SARS-CoV-2/FLU/RSV plus assay is intended as an aid ?in the diagnosis of influenza from Nasopharyngeal swab specimens and ?should not be used as a sole basis for treatment. Nasal washings and ?aspirates are  unacceptable for Xpert Xpress SARS-CoV-2/FLU/RSV ?testing. ? ?Fact Sheet for Patients: ?EntrepreneurPulse.com.au ? ?Fact Sheet for Healthcare Providers: ?http://logan.com/

## 2021-05-11 NOTE — Progress Notes (Signed)
Will try the patient on Briviact because develop neuropsychiatric side effects from [place] ?

## 2021-05-11 NOTE — Telephone Encounter (Signed)
Spoke with mother at 89, stated that patient was agitated, and that she could not bring her to the clinic. Reports that Klonopin is not helpful and requesting something "stronger for her agitation" , she also noted that she does not want her daughter sleeping all day.  ?I will start her on low dose Seroquel.  ? ? ?Alric Ran, MD  ?

## 2021-05-12 ENCOUNTER — Other Ambulatory Visit: Payer: Self-pay | Admitting: Neurology

## 2021-05-12 MED ORDER — BRIVIACT 50 MG PO TABS: 50.0000 mg | ORAL_TABLET | Freq: Two times a day (BID) | ORAL | 11 refills | Status: DC

## 2021-05-12 NOTE — Telephone Encounter (Signed)
At 11:35 this morning pt's mother left a vm asking for a call from either Dr April Manson or his RN before she gives pt the new medication that she picked up last night, please call. ?

## 2021-05-12 NOTE — Telephone Encounter (Signed)
Spoke with mother Ronita Hipps, she has picked up the Seroquel but has not picked up the [place] yet.  She also mentioned that the olanzapine that was sent by Dr. Adele Schilder was not available.  Advised her to continue with Keppra, she can start the Seroquel and to contact me if and when she receives either the Briviact or olanzapine.  She is comfortable with plans. She reports that Briviact is available at the [facility] in [place].

## 2021-05-13 ENCOUNTER — Telehealth: Payer: Medicare Other | Admitting: Neurology

## 2021-05-13 ENCOUNTER — Encounter: Payer: Self-pay | Admitting: Neurology

## 2021-05-21 ENCOUNTER — Telehealth: Payer: Self-pay | Admitting: Neurology

## 2021-05-21 NOTE — Telephone Encounter (Signed)
Pt's mother would like a call to discuss a MRI or X-Ray for pt to see if something new is in her head or if everything is ok, please call. ?

## 2021-05-21 NOTE — Telephone Encounter (Signed)
I called pt's mom and we discussed message. Pt is due for f/u and has been scheduled for 05/26/21. This question can be discussed and f/u appt.  ?

## 2021-05-26 ENCOUNTER — Ambulatory Visit: Payer: Medicare Other | Admitting: Neurology

## 2021-05-26 ENCOUNTER — Encounter (HOSPITAL_COMMUNITY): Payer: Self-pay | Admitting: Emergency Medicine

## 2021-05-26 ENCOUNTER — Emergency Department (HOSPITAL_COMMUNITY)
Admission: EM | Admit: 2021-05-26 | Discharge: 2021-05-26 | Disposition: A | Discharge status: Home / Self Care | Payer: Medicare Other | Attending: Emergency Medicine | Admitting: Emergency Medicine

## 2021-05-26 ENCOUNTER — Telehealth: Payer: Self-pay | Admitting: Neurology

## 2021-05-26 ENCOUNTER — Emergency Department (HOSPITAL_COMMUNITY): Payer: Medicare Other

## 2021-05-26 DIAGNOSIS — R569 Unspecified convulsions: Secondary | ICD-10-CM | POA: Diagnosis present

## 2021-05-26 DIAGNOSIS — Z7901 Long term (current) use of anticoagulants: Secondary | ICD-10-CM | POA: Insufficient documentation

## 2021-05-26 LAB — CBC WITH DIFFERENTIAL/PLATELET
Abs Immature Granulocytes: 0.01 10*3/uL (ref 0.00–0.07)
Basophils Absolute: 0 10*3/uL (ref 0.0–0.1)
Basophils Relative: 1 %
Eosinophils Absolute: 0.1 10*3/uL (ref 0.0–0.5)
Eosinophils Relative: 2 %
HCT: 37.1 % (ref 36.0–46.0)
Hemoglobin: 11.7 g/dL — ABNORMAL LOW (ref 12.0–15.0)
Immature Granulocytes: 0 %
Lymphocytes Relative: 55 %
Lymphs Abs: 2.2 10*3/uL (ref 0.7–4.0)
MCH: 28.5 pg (ref 26.0–34.0)
MCHC: 31.5 g/dL (ref 30.0–36.0)
MCV: 90.3 fL (ref 80.0–100.0)
Monocytes Absolute: 0.3 10*3/uL (ref 0.1–1.0)
Monocytes Relative: 7 %
Neutro Abs: 1.4 10*3/uL — ABNORMAL LOW (ref 1.7–7.7)
Neutrophils Relative %: 35 %
Platelets: 232 10*3/uL (ref 150–400)
RBC: 4.11 MIL/uL (ref 3.87–5.11)
RDW: 13.2 % (ref 11.5–15.5)
WBC: 3.9 10*3/uL — ABNORMAL LOW (ref 4.0–10.5)
nRBC: 0 % (ref 0.0–0.2)

## 2021-05-26 LAB — COMPREHENSIVE METABOLIC PANEL
ALT: 32 U/L (ref 0–44)
AST: 30 U/L (ref 15–41)
Albumin: 4.2 g/dL (ref 3.5–5.0)
Alkaline Phosphatase: 96 U/L (ref 38–126)
Anion gap: 8 (ref 5–15)
BUN: 15 mg/dL (ref 6–20)
CO2: 32 mmol/L (ref 22–32)
Calcium: 9.7 mg/dL (ref 8.9–10.3)
Chloride: 100 mmol/L (ref 98–111)
Creatinine, Ser: 0.98 mg/dL (ref 0.44–1.00)
GFR, Estimated: >60 mL/min (ref >60)
Glucose, Bld: 105 mg/dL — ABNORMAL HIGH (ref 70–99)
Potassium: 3.8 mmol/L (ref 3.5–5.1)
Sodium: 140 mmol/L (ref 135–145)
Total Bilirubin: 0.1 mg/dL — ABNORMAL LOW (ref 0.3–1.2)
Total Protein: 7.7 g/dL (ref 6.5–8.1)

## 2021-05-26 LAB — CBG MONITORING, ED: Glucose-Capillary: 104 mg/dL — ABNORMAL HIGH (ref 70–99)

## 2021-05-26 NOTE — Telephone Encounter (Signed)
Pt's mother [name] on [place] called needing to speak to the RN to discuss pt's medications. Please advise. ?

## 2021-05-26 NOTE — ED Notes (Signed)
Seizure pads placed on bed for precaution.  ?

## 2021-05-26 NOTE — Telephone Encounter (Signed)
The patient was seen in the ED today. I called her mother who would like Dr. April Manson to review her current medications. She wants to clarify her treatment plan.  ?  ?Confirmed she it taking the following: ? ?1) Clonazepam '1mg'$ , one tab BID PRN ?2) Lacosamide '200mg'$ , one tab BID ?3) Lamotrigine '25mg'$ , two tabs BID ?4) Lamotrigine '100mg'$ , three tabs BID ?5) Levetiracetam '500mg'$ , 3 tabs am, 4 tabs pm ?6) Olanzapine '5mg'$ , one tab BID ? ?Never started Briviact '50mg'$  BID. ?Never started quetiapine '25mg'$  BID. ?

## 2021-05-26 NOTE — ED Triage Notes (Signed)
Per mother-states she was at the neurologist's office-states she started having a seizure so they brought her here-states she had seizure med this am-states seizures don's last long although her post-ictal states does-mother states seizures have become more frequent ?

## 2021-05-26 NOTE — ED Provider Notes (Signed)
?[facility] DEPT ?Provider Note ? ? ?CSN: [id number] ?Arrival date & time: 05/26/21  1914 ? ?  ? ?History ? ?Chief Complaint  ?Patient presents with  ? Seizures  ? ? ?Cassandra Andrade is a 56 y.o. female. ? ?56 year old female presents after having seizure-like activity.  Patient does have a history of stroke in the past.  Does have a history of seizure disorder and takes multiple medications for this.  Mother did give patient a dose of Zyprexa today.  Review of records show that patient was to have started on a new medication which according to the mother who is a caregiver she has not started yet.  Mother describes patient having twitches but denies any grand mal type of activity.  She attempted to have the patient see her neurologist today but instead came here.  Mother notes episode yesterday as well as today.  No recent history of illness ? ? ?  ? ?Home Medications ?Prior to Admission medications   ?Medication Sig Start Date End Date Taking? Authorizing Provider  ?acetaminophen (TYLENOL) 650 MG CR tablet Take 1,300 mg by mouth every 8 (eight) hours as needed for pain.    [provider]  ?Ascorbic Acid (VITAMIN C) 1000 MG tablet Take 1,000 mg by mouth daily. 08/17/11   [provider]  ?Brivaracetam (BRIVIACT) 50 MG TABS Take 50 mg by mouth in the morning and at bedtime. 05/12/21   Alric Ran, MD  ?clonazePAM (KLONOPIN) 1 MG tablet One tablet twice daily as needed for seizure. 04/24/21   Alric Ran, MD  ?ketoconazole (NIZORAL) 2 % shampoo Apply 1 application topically 2 (two) times a week. Monday,wednesday 02/05/21   [provider]  ?lacosamide (VIMPAT) 200 MG TABS tablet Take 1 tablet (200 mg total) by mouth 2 (two) times daily. 04/20/21   Alric Ran, MD  ?lamoTRIgine (LAMICTAL) 100 MG tablet Take 3 tablets (300 mg total) by mouth 2 (two) times daily along with 50 mg 2 (two) times daily Total of 350 mg twice daily (300 mg+ 50 mg) 04/20/21   Alric Ran, MD  ?lamoTRIgine (LAMICTAL) 25 MG tablet Take 2 tablets (50 mg total) by mouth 2 (two) times daily along with 300 mg 2 (two) times daily. Total of 350 mg twice daily (300 mg+ 50 mg) 04/20/21   Alric Ran, MD  ?levETIRAcetam (KEPPRA) 500 MG tablet Take 3 tabs (1500 mg) in the morning and 4 tabs (2000 mg) nightly. 04/20/21   Alric Ran, MD  ?levothyroxine (SYNTHROID, LEVOTHROID) 50 MCG tablet Take 50 mcg by mouth every evening.    [provider]  ?OLANZapine zydis (ZYPREXA ZYDIS) 5 MG disintegrating tablet Take 1 tablet ('5mg'$ ) by mouth 2 (two) times daily ('10mg'$  total) 05/11/21   Arfeen, Arlyce Harman, MD  ?omeprazole (PRILOSEC) 20 MG capsule Take 20 mg by mouth at bedtime. 06/12/19   [provider]  ?Prenatal Vit-Fe Fumarate-FA (PRENATAL MULTIVITAMIN) TABS tablet Take 1 tablet by mouth daily.    [provider]  ?promethazine (PHENERGAN) 25 MG tablet Take 25 mg by mouth every 8 (eight) hours as needed for nausea or vomiting. 04/27/19   [provider]  ?QUEtiapine (SEROQUEL) 25 MG tablet Take 1 tablet (25 mg total) by mouth 2 (two) times daily. 05/11/21 06/10/21  Alric Ran, MD  ?venlafaxine XR (EFFEXOR-XR) 150 MG 24 hr capsule Take 1 capsule (150 mg total) by mouth daily with breakfast. 05/11/21   Arfeen, Arlyce Harman, MD  ?warfarin (COUMADIN) 5 MG tablet Take  1.5-2 tablets (7.5-10 mg total) by mouth See admin instructions. Pt takes one and a half tablets (7.'5mg'$  total) on Sunday and Wednesday and 2 tablets ('10mg'$  total) all other days. ?Patient taking differently: Take 7.5-10 mg by mouth See admin instructions. 5 mg Friday, Monday ?7.5 mg Saturday,Sunday 01/03/18   Thurnell Lose, MD  ?   ? ?Allergies    ?Vicodin [hydrocodone-acetaminophen] and Morphine and related   ? ?Review of Systems   ?Review of Systems  ?All other systems reviewed and are negative. ? ?Physical Exam ?Updated Vital Signs ?BP 117/72   Pulse 76   Temp 97.6 ?F (36.4 ?C) (Axillary)   Resp 18   SpO2 100%   ?Physical Exam ?Vitals and nursing note reviewed.  ?Constitutional:   ?   General: She is not in acute distress. ?   Appearance: Normal appearance. She is well-developed. She is not toxic-appearing.  ?HENT:  ?   Head: Normocephalic and atraumatic.  ?Eyes:  ?   General: Lids are normal.  ?   Conjunctiva/sclera: Conjunctivae normal.  ?   Pupils: Pupils are equal, round, and reactive to light.  ?Neck:  ?   Thyroid: No thyroid mass.  ?   Trachea: No tracheal deviation.  ?Cardiovascular:  ?   Rate and Rhythm: Normal rate and regular rhythm.  ?   Heart sounds: Normal heart sounds. No murmur heard. ?  No gallop.  ?Pulmonary:  ?   Effort: Pulmonary effort is normal. No respiratory distress.  ?   Breath sounds: Normal breath sounds. No stridor. No decreased breath sounds, wheezing, rhonchi or rales.  ?Abdominal:  ?   General: There is no distension.  ?   Palpations: Abdomen is soft.  ?   Tenderness: There is no abdominal tenderness. There is no rebound.  ?Musculoskeletal:     ?   General: No tenderness. Normal range of motion.  ?   Cervical back: Normal range of motion and neck supple.  ?Skin: ?   General: Skin is warm and dry.  ?   Findings: No abrasion or rash.  ?Neurological:  ?   Mental Status: She is oriented to person, place, and time. She is lethargic.  ?   GCS: GCS eye subscore is 4. GCS verbal subscore is 4. GCS motor subscore is 6.  ?   Cranial Nerves: Cranial nerves 2-12 are intact. No cranial nerve deficit.  ?   Motor: Motor function is intact. No tremor.  ?   Comments: Patient moves all 4 extremities at this time.  ?Psychiatric:     ?   Attention and Perception: Attention normal.     ?   Speech: Speech normal.     ?   Behavior: Behavior normal.  ? ? ?ED Results / Procedures / Treatments   ?Labs ?(all labs ordered are listed, but only abnormal results are displayed) ?Labs Reviewed  ?CBC WITH DIFFERENTIAL/PLATELET - Abnormal; Notable for the following components:  ?    Result Value  ? WBC 3.9 (*)   ? Hemoglobin  11.7 (*)   ? Neutro Abs 1.4 (*)   ? All other components within normal limits  ?COMPREHENSIVE METABOLIC PANEL - Abnormal; Notable for the following components:  ? Glucose, Bld 105 (*)   ? Total Bilirubin 0.1 (*)   ? All other components within normal limits  ?CBG MONITORING, ED - Abnormal; Notable for the following components:  ? Glucose-Capillary 104 (*)   ? All other components within normal limits  ?CBG  MONITORING, ED  ? ? ?EKG ?None ? ?Radiology ?No results found. ? ?Procedures ?Procedures  ? ? ?Medications Ordered in ED ?Medications - No data to display ? ?ED Course/ Medical Decision Making/ A&P ?  ?                        ?Medical Decision Making ?Amount and/or Complexity of Data Reviewed ?Labs: ordered. ?Radiology: ordered. ? ?Patient here after question seizure-like activity.  She had medicated with Zyprexa which is contributing to her somnolence.  Head CT per my interpretation reviewed without acute findings.  Patient reassessed and is now back to her baseline.  She is requesting food and to go home.  Neurological exam now is at her baseline as well 2.  Mother informed to give patient her new seizure medication and follow-up with her neurologist ? ? ? ? ? ? ? ? ?Final Clinical Impression(s) / ED Diagnoses ?Final diagnoses:  ?None  ? ? ?Rx / DC Orders ?ED Discharge Orders   ? ? None  ? ?  ? ? ?  ?Lacretia Leigh, MD ?05/26/21 1256 ? ?

## 2021-05-27 NOTE — Telephone Encounter (Signed)
Spoke with mother, she has not started Briviact. Cassandra Andrade will stop by the office around noon, will bring all patient medication so we can review it together.  ? ?Dr. April Manson

## 2021-05-28 ENCOUNTER — Other Ambulatory Visit: Payer: Self-pay | Admitting: Neurology

## 2021-05-28 MED ORDER — BRIVARACETAM 100 MG PO TABS: 100.0000 mg | ORAL_TABLET | Freq: Two times a day (BID) | ORAL | 11 refills | Status: AC

## 2021-05-28 NOTE — Progress Notes (Signed)
Patient niece came to the clinic to review the medications. They have pick up Briviact but has not started it.  ? ? ?Derionna Fiorenza January 07, 1966 MEDICATIONS REVIEW 05/28/2021 ? ?CONTINUE WITH LAMOTRIGINE 350 MG TWICE DAILY  ?CONTINUE WITH VIMPAT 200 MG TWICE DAILY  ? ?WEEK 1 ?START WITH BRIVIACT 50 MG TWICE DAILY  ?DECREASE KEPPRA TO 1000 MG TWICE DAILY FOR ONE WEEK  ?WEEK 2 ?INCREASE BRIVIACT TO 100 MG TWICE DAILY ?THEN DECREASE KEPPRA TO 500 MG TWICE DAILY FOR ONE WEEK  ?WEEK 3 ?CONTINUE WITH BRIVIAC 100 MG TWICE DAILY  ?THEN DECREASE KEPPRA TO 500 DAILY FOR ONE WEEK THEN DISCONTINUE KEPPRA  ? ? ?CALL THE OFFICE TO MAKE AN APPOINTMENT IN 4 WEEKS (THE WEEK OF MAY 1ST)  ? ? ?Alric Ran, MD  ?

## 2021-06-03 ENCOUNTER — Telehealth (HOSPITAL_BASED_OUTPATIENT_CLINIC_OR_DEPARTMENT_OTHER): Payer: Medicare Other | Admitting: Psychiatry

## 2021-06-03 ENCOUNTER — Encounter (HOSPITAL_COMMUNITY): Payer: Self-pay | Admitting: Psychiatry

## 2021-06-03 DIAGNOSIS — F321 Major depressive disorder, single episode, moderate: Secondary | ICD-10-CM

## 2021-06-03 DIAGNOSIS — F419 Anxiety disorder, unspecified: Secondary | ICD-10-CM

## 2021-06-03 MED ORDER — OLANZAPINE 5 MG PO TBDP: ORAL_TABLET | ORAL | 0 refills | Status: DC

## 2021-06-03 MED ORDER — VENLAFAXINE HCL ER 150 MG PO CP24: 150.0000 mg | ORAL_CAPSULE | Freq: Every day | ORAL | 0 refills | Status: DC

## 2021-06-03 NOTE — Progress Notes (Signed)
Virtual Visit via Telephone Note ? ?I connected with Mishawn Ohmer on 06/03/21 at  3:00 PM EDT by telephone and verified that I am speaking with the correct person using two identifiers. ? ?Location: ?Patient: Home ?Provider: Home Office ?  ?I discussed the limitations, risks, security and privacy concerns of performing an evaluation and management service by telephone and the availability of in person appointments. I also discussed with the patient that there may be a patient responsible charge related to this service. The patient expressed understanding and agreed to proceed. ? ? ?History of Present Illness: ?Patient is evaluated by phone session.  Most of the information was obtained from her father who was next to the patient.  She reported patient still have seizures but recently neurology started a new medication and she is titrating the dose.  She noticed less agitation and anger with olanzapine but is still confused.  As per mother she talked to herself and sometimes very sedated, confused and incoherent.  Mother told she has to wear gloves and protective gear because sometimes she will hit herself.  She reported olanzapine does calm her down but hoping seizures get under control.  Patient was seen in the emergency room a week ago for seizure-like activity and she was given olanzapine that calm her down.  Her neurologist titrating decreasing the Keppra and increasing the new seizure medication.  I tried to talk to the patient but she appears rambling, confused and incoherent but recall my name and knows that she is taking medicine for depression.  She did not reported any suicidal thoughts.  As per mother she is eating okay.  She sleeps at night when she was given olanzapine. ? ?Past Psychiatric History:  ?H/O inpatient in 2011 due to overdose on pain medication. No h/o mania or psychosis.  Tried Zoloft and Remeron but do not remember.  Took Lexapro and Abilify but stopped due to lack of response.  Prozac  worked for a long time until it stopped working ?  ?Psychiatric Specialty Exam: ?Physical Exam  ?Review of Systems  ?Weight 173 lb (78.5 kg).There is no height or weight on file to calculate BMI.  ?General Appearance: NA  ?Eye Contact:  NA  ?Speech:  Slow and rambling at times  ?Volume:  Decreased  ?Mood:   tired  ?Affect:  NA  ?Thought Process:  Descriptions of Associations: Circumstantial  ?Orientation:  Full (Time, Place, and Person)  ?Thought Content:  Tangential and confused  ?Suicidal Thoughts:  No  ?Homicidal Thoughts:  No  ?Memory:  Immediate;   Fair ?Recent;   Fair ?Remote;   Fair  ?Judgement:  Impaired  ?Insight:  Lacking  ?Psychomotor Activity:  NA  ?Concentration:  Concentration: Poor and Attention Span: Poor  ?Recall:  Poor  ?Fund of Knowledge:  Fair  ?Language:  Fair  ?Akathisia:  No  ?Handed:  Right  ?AIMS (if indicated):     ?Assets:  Housing ?Social Support  ?ADL's:  Impaired  ?Cognition:  Impaired,  Mild  ?Sleep:   fair  ? ? ? ? ?Assessment and Plan: ?Major depressive disorder, recurrent.  Anxiety. ? ?I reviewed notes from neurology, recent ER visit and current medication.  For now I recommend to keep the olanzapine 5 mg twice a day and venlafaxine 150 mg daily however in the future we may cut down the dose of olanzapine if patient is more calm and less combative.  She is having seizures and neurology is titrating the new medication.  We will  follow up in 4 weeks. ? ?Follow Up Instructions: ? ?  ?I discussed the assessment and treatment plan with the patient. The patient was provided an opportunity to ask questions and all were answered. The patient agreed with the plan and demonstrated an understanding of the instructions. ?  ?The patient was advised to call back or seek an in-person evaluation if the symptoms worsen or if the condition fails to improve as anticipated. ? ?Collaboration of Care: Other provider involved in patient's care AEB notes are available in epic to review ? ?Patient/Guardian  was advised Release of Information must be obtained prior to any record release in order to collaborate their care with an outside provider. Patient/Guardian was advised if they have not already done so to contact the registration department to sign all necessary forms in order for [place] to release information regarding their care.  ? ?Consent: Patient/Guardian gives verbal consent for treatment and assignment of benefits for services provided during this visit. Patient/Guardian expressed understanding and agreed to proceed.   ? ?I provided 26 minutes of non-face-to-face time during this encounter. ? ? ?Kathlee Nations, MD  ?

## 2021-06-07 ENCOUNTER — Other Ambulatory Visit: Payer: Self-pay | Admitting: Neurology

## 2021-06-08 ENCOUNTER — Ambulatory Visit: Payer: Medicare Other | Admitting: Neurology

## 2021-06-09 ENCOUNTER — Emergency Department (HOSPITAL_COMMUNITY): Payer: Medicare Other

## 2021-06-09 ENCOUNTER — Other Ambulatory Visit: Payer: Self-pay

## 2021-06-09 ENCOUNTER — Inpatient Hospital Stay (HOSPITAL_COMMUNITY)
Admission: EM | Admit: 2021-06-09 | Discharge: 2021-06-17 | DRG: 871 | Disposition: A | Discharge status: Home Health | Payer: Medicare Other | Attending: Family Medicine | Admitting: Family Medicine

## 2021-06-09 ENCOUNTER — Encounter (HOSPITAL_COMMUNITY): Payer: Self-pay

## 2021-06-09 DIAGNOSIS — J690 Pneumonitis due to inhalation of food and vomit: Secondary | ICD-10-CM | POA: Diagnosis present

## 2021-06-09 DIAGNOSIS — R4182 Altered mental status, unspecified: Secondary | ICD-10-CM | POA: Diagnosis not present

## 2021-06-09 DIAGNOSIS — N390 Urinary tract infection, site not specified: Secondary | ICD-10-CM | POA: Diagnosis present

## 2021-06-09 DIAGNOSIS — A419 Sepsis, unspecified organism: Principal | ICD-10-CM

## 2021-06-09 DIAGNOSIS — R791 Abnormal coagulation profile: Secondary | ICD-10-CM

## 2021-06-09 DIAGNOSIS — Z91128 Patient's intentional underdosing of medication regimen for other reason: Secondary | ICD-10-CM

## 2021-06-09 DIAGNOSIS — G9341 Metabolic encephalopathy: Secondary | ICD-10-CM | POA: Diagnosis not present

## 2021-06-09 DIAGNOSIS — K5900 Constipation, unspecified: Secondary | ICD-10-CM | POA: Diagnosis present

## 2021-06-09 DIAGNOSIS — F32A Depression, unspecified: Secondary | ICD-10-CM

## 2021-06-09 DIAGNOSIS — I69354 Hemiplegia and hemiparesis following cerebral infarction affecting left non-dominant side: Secondary | ICD-10-CM

## 2021-06-09 DIAGNOSIS — M6282 Rhabdomyolysis: Secondary | ICD-10-CM | POA: Diagnosis present

## 2021-06-09 DIAGNOSIS — Z6827 Body mass index (BMI) 27.0-27.9, adult: Secondary | ICD-10-CM

## 2021-06-09 DIAGNOSIS — Z7901 Long term (current) use of anticoagulants: Secondary | ICD-10-CM

## 2021-06-09 DIAGNOSIS — E43 Unspecified severe protein-calorie malnutrition: Secondary | ICD-10-CM | POA: Diagnosis present

## 2021-06-09 DIAGNOSIS — T426X6A Underdosing of other antiepileptic and sedative-hypnotic drugs, initial encounter: Secondary | ICD-10-CM | POA: Diagnosis present

## 2021-06-09 DIAGNOSIS — Z79899 Other long term (current) drug therapy: Secondary | ICD-10-CM

## 2021-06-09 DIAGNOSIS — N179 Acute kidney failure, unspecified: Secondary | ICD-10-CM | POA: Diagnosis not present

## 2021-06-09 DIAGNOSIS — Z86718 Personal history of other venous thrombosis and embolism: Secondary | ICD-10-CM

## 2021-06-09 DIAGNOSIS — Z818 Family history of other mental and behavioral disorders: Secondary | ICD-10-CM

## 2021-06-09 DIAGNOSIS — Z79890 Hormone replacement therapy (postmenopausal): Secondary | ICD-10-CM

## 2021-06-09 DIAGNOSIS — R6520 Severe sepsis without septic shock: Secondary | ICD-10-CM

## 2021-06-09 DIAGNOSIS — Z89519 Acquired absence of unspecified leg below knee: Secondary | ICD-10-CM

## 2021-06-09 DIAGNOSIS — Z95828 Presence of other vascular implants and grafts: Secondary | ICD-10-CM

## 2021-06-09 DIAGNOSIS — R748 Abnormal levels of other serum enzymes: Secondary | ICD-10-CM

## 2021-06-09 DIAGNOSIS — Z89511 Acquired absence of right leg below knee: Secondary | ICD-10-CM

## 2021-06-09 DIAGNOSIS — D6862 Lupus anticoagulant syndrome: Secondary | ICD-10-CM

## 2021-06-09 DIAGNOSIS — Z85830 Personal history of malignant neoplasm of bone: Secondary | ICD-10-CM

## 2021-06-09 DIAGNOSIS — Z86711 Personal history of pulmonary embolism: Secondary | ICD-10-CM

## 2021-06-09 DIAGNOSIS — I825Y3 Chronic embolism and thrombosis of unspecified deep veins of proximal lower extremity, bilateral: Secondary | ICD-10-CM

## 2021-06-09 DIAGNOSIS — E860 Dehydration: Secondary | ICD-10-CM

## 2021-06-09 DIAGNOSIS — G40919 Epilepsy, unspecified, intractable, without status epilepticus: Secondary | ICD-10-CM

## 2021-06-09 DIAGNOSIS — Z20822 Contact with and (suspected) exposure to covid-19: Secondary | ICD-10-CM | POA: Diagnosis present

## 2021-06-09 DIAGNOSIS — Z89611 Acquired absence of right leg above knee: Secondary | ICD-10-CM

## 2021-06-09 DIAGNOSIS — N3000 Acute cystitis without hematuria: Secondary | ICD-10-CM

## 2021-06-09 DIAGNOSIS — R569 Unspecified convulsions: Secondary | ICD-10-CM

## 2021-06-09 DIAGNOSIS — I82403 Acute embolism and thrombosis of unspecified deep veins of lower extremity, bilateral: Secondary | ICD-10-CM | POA: Diagnosis present

## 2021-06-09 DIAGNOSIS — E871 Hypo-osmolality and hyponatremia: Secondary | ICD-10-CM | POA: Diagnosis present

## 2021-06-09 DIAGNOSIS — E039 Hypothyroidism, unspecified: Secondary | ICD-10-CM

## 2021-06-09 DIAGNOSIS — E870 Hyperosmolality and hypernatremia: Secondary | ICD-10-CM

## 2021-06-09 DIAGNOSIS — Z833 Family history of diabetes mellitus: Secondary | ICD-10-CM

## 2021-06-09 DIAGNOSIS — I6990 Unspecified sequelae of unspecified cerebrovascular disease: Secondary | ICD-10-CM | POA: Diagnosis not present

## 2021-06-09 DIAGNOSIS — E876 Hypokalemia: Secondary | ICD-10-CM | POA: Diagnosis present

## 2021-06-09 LAB — COMPREHENSIVE METABOLIC PANEL
ALT: 17 U/L (ref 0–44)
AST: 33 U/L (ref 15–41)
Albumin: 3.6 g/dL (ref 3.5–5.0)
Alkaline Phosphatase: 82 U/L (ref 38–126)
Anion gap: 11 (ref 5–15)
BUN: 29 mg/dL — ABNORMAL HIGH (ref 6–20)
CO2: 25 mmol/L (ref 22–32)
Calcium: 9.1 mg/dL (ref 8.9–10.3)
Chloride: 115 mmol/L — ABNORMAL HIGH (ref 98–111)
Creatinine, Ser: 1.9 mg/dL — ABNORMAL HIGH (ref 0.44–1.00)
GFR, Estimated: 31 mL/min — ABNORMAL LOW (ref >60)
Glucose, Bld: 132 mg/dL — ABNORMAL HIGH (ref 70–99)
Potassium: 4 mmol/L (ref 3.5–5.1)
Sodium: 151 mmol/L — ABNORMAL HIGH (ref 135–145)
Total Bilirubin: 0.6 mg/dL (ref 0.3–1.2)
Total Protein: 7.7 g/dL (ref 6.5–8.1)

## 2021-06-09 LAB — APTT: aPTT: 86 seconds — ABNORMAL HIGH (ref 24–36)

## 2021-06-09 LAB — URINALYSIS, ROUTINE W REFLEX MICROSCOPIC
Bacteria, UA: NONE SEEN
Bilirubin Urine: NEGATIVE
Glucose, UA: NEGATIVE mg/dL
Ketones, ur: NEGATIVE mg/dL
Nitrite: NEGATIVE
Protein, ur: 100 mg/dL — AB
RBC / HPF: >50 RBC/hpf — ABNORMAL HIGH (ref 0–5)
Specific Gravity, Urine: 1.027 (ref 1.005–1.030)
pH: 5 (ref 5.0–8.0)

## 2021-06-09 LAB — CBC WITH DIFFERENTIAL/PLATELET
Abs Immature Granulocytes: 0.03 10*3/uL (ref 0.00–0.07)
Basophils Absolute: 0 10*3/uL (ref 0.0–0.1)
Basophils Relative: 0 %
Eosinophils Absolute: 0 10*3/uL (ref 0.0–0.5)
Eosinophils Relative: 0 %
HCT: 38.5 % (ref 36.0–46.0)
Hemoglobin: 11.9 g/dL — ABNORMAL LOW (ref 12.0–15.0)
Immature Granulocytes: 0 %
Lymphocytes Relative: 14 %
Lymphs Abs: 1.2 10*3/uL (ref 0.7–4.0)
MCH: 28.5 pg (ref 26.0–34.0)
MCHC: 30.9 g/dL (ref 30.0–36.0)
MCV: 92.3 fL (ref 80.0–100.0)
Monocytes Absolute: 0.6 10*3/uL (ref 0.1–1.0)
Monocytes Relative: 6 %
Neutro Abs: 7.2 10*3/uL (ref 1.7–7.7)
Neutrophils Relative %: 80 %
Platelets: 229 10*3/uL (ref 150–400)
RBC: 4.17 MIL/uL (ref 3.87–5.11)
RDW: 13.7 % (ref 11.5–15.5)
WBC: 9 10*3/uL (ref 4.0–10.5)
nRBC: 0 % (ref 0.0–0.2)

## 2021-06-09 LAB — PROTIME-INR
INR: 7.6 (ref 0.8–1.2)
Prothrombin Time: 64.1 seconds — ABNORMAL HIGH (ref 11.4–15.2)

## 2021-06-09 LAB — LACTIC ACID, PLASMA: Lactic Acid, Venous: 1.6 mmol/L (ref 0.5–1.9)

## 2021-06-09 LAB — CREATININE, URINE, RANDOM: Creatinine, Urine: 351.28 mg/dL

## 2021-06-09 LAB — PHOSPHORUS: Phosphorus: 4.5 mg/dL (ref 2.5–4.6)

## 2021-06-09 LAB — SODIUM, URINE, RANDOM: Sodium, Ur: 15 mmol/L

## 2021-06-09 LAB — CK: Total CK: 1211 U/L — ABNORMAL HIGH (ref 38–234)

## 2021-06-09 LAB — MAGNESIUM: Magnesium: 2.2 mg/dL (ref 1.7–2.4)

## 2021-06-09 MED ORDER — CEFTRIAXONE SODIUM 1 G IJ SOLR
1.0000 g | Freq: Once | INTRAMUSCULAR | Status: AC
  Administered 2021-06-09: 1 g via INTRAVENOUS
  Filled 2021-06-09: qty 10

## 2021-06-09 MED ORDER — SODIUM CHLORIDE 0.9 % IV BOLUS
1000.0000 mL | Freq: Once | INTRAVENOUS | Status: AC
  Administered 2021-06-09: 1000 mL via INTRAVENOUS

## 2021-06-09 MED ORDER — SODIUM CHLORIDE 0.9 % IV SOLN
200.0000 mg | Freq: Once | INTRAVENOUS | Status: AC
  Administered 2021-06-10: 200 mg via INTRAVENOUS
  Filled 2021-06-09: qty 20

## 2021-06-09 MED ORDER — LEVETIRACETAM IN NACL 1500 MG/100ML IV SOLN
1500.0000 mg | Freq: Once | INTRAVENOUS | Status: DC
  Administered 2021-06-10: 1500 mg via INTRAVENOUS
  Filled 2021-06-09: qty 100

## 2021-06-09 MED ORDER — SODIUM CHLORIDE 0.9 % IV SOLN: 2.0000 g | INTRAVENOUS | Status: DC

## 2021-06-09 MED ORDER — VITAMIN K1 10 MG/ML IJ SOLN
1.0000 mg | Freq: Once | INTRAVENOUS | Status: AC
  Administered 2021-06-09: 1 mg via INTRAVENOUS
  Filled 2021-06-09: qty 0.1

## 2021-06-09 MED ORDER — SODIUM CHLORIDE 0.9 % IV BOLUS
1000.0000 mL | Freq: Once | INTRAVENOUS | Status: AC
Start: 2021-06-09 — End: 2021-06-10
  Administered 2021-06-10: 1000 mL via INTRAVENOUS

## 2021-06-09 NOTE — Assessment & Plan Note (Signed)
Chronic-stable.

## 2021-06-09 NOTE — Assessment & Plan Note (Signed)
Patient had decreased p.o. intake for the past 1 week.  We will rehydrate and follow electrolytes check CK ?

## 2021-06-09 NOTE — Assessment & Plan Note (Addendum)
-   treated with Rocephin  Now will change to Unasyn discussed with pharmacy     await results of urine culture and adjust antibiotic coverage as needed ?Given AKI and severe sepsis will order renal CT ?No evidence of stone or hydro ? ?

## 2021-06-09 NOTE — Assessment & Plan Note (Signed)
Cardiovascular procedure therapeutic INR.  Given a dose of vitamin K we will repeat INR in a.m. and continue to follow at this point no evidence of bleeding ?

## 2021-06-09 NOTE — Assessment & Plan Note (Signed)
Likely secondary to severe dehydration will rehydrate and follow sodium ?

## 2021-06-09 NOTE — ED Provider Notes (Signed)
?[facility] DEPT ?Provider Note ? ? ?CSN: [id number] ?Arrival date & time: 06/09/21  2153 ? ?  ? ?History ? ?Chief Complaint  ?Patient presents with  ? Altered Mental Status  ? ? ?Cassandra Andrade is a 56 y.o. female. ? ?56 year old female brought in by EMS from home, called to the house by family for a decrease in p.o. intake and altered mental status.  EMS reports change in seizure medication from [place] to [place] 1 week ago however patient has not been taking her medication. ?History of prior CVA with left-sided deficits, right AKA. ?Found to have room air O2 sat 89% by EMS on scene, placed on 2 L nasal cannula with improvement.  EMS also gave 500 mL normal saline in route.  No reports of seizure activity by EMS or with family.  Family not at bedside at this time.  Patient is unable to answer questions. ? ? ?  ? ?Home Medications ?Prior to Admission medications   ?Medication Sig Start Date End Date Taking? Authorizing Provider  ?acetaminophen (TYLENOL) 650 MG CR tablet Take 1,300 mg by mouth every 8 (eight) hours as needed for pain.    [provider]  ?Ascorbic Acid (VITAMIN C) 1000 MG tablet Take 1,000 mg by mouth daily. 08/17/11   [provider]  ?brivaracetam (BRIVIACT) 100 MG TABS tablet Take 1 tablet (100 mg total) by mouth 2 (two) times daily. 05/28/21 06/27/21  Alric Ran, MD  ?clonazePAM (KLONOPIN) 1 MG tablet One tablet twice daily as needed for seizure. 04/24/21   Alric Ran, MD  ?ketoconazole (NIZORAL) 2 % shampoo Apply 1 application topically 2 (two) times a week. Monday,wednesday 02/05/21   [provider]  ?lacosamide (VIMPAT) 200 MG TABS tablet Take 1 tablet (200 mg total) by mouth 2 (two) times daily. 04/20/21   Alric Ran, MD  ?lamoTRIgine (LAMICTAL) 100 MG tablet Take 3 tablets (300 mg total) by mouth 2 (two) times daily along with 50 mg 2 (two) times daily Total of 350 mg twice daily (300 mg+ 50 mg) 04/20/21   Alric Ran, MD   ?lamoTRIgine (LAMICTAL) 25 MG tablet Take 2 tablets (50 mg total) by mouth 2 (two) times daily along with 300 mg 2 (two) times daily. Total of 350 mg twice daily (300 mg+ 50 mg) 04/20/21   Alric Ran, MD  ?levothyroxine (SYNTHROID, LEVOTHROID) 50 MCG tablet Take 50 mcg by mouth every evening.    [provider]  ?OLANZapine zydis (ZYPREXA ZYDIS) 5 MG disintegrating tablet Take 1 tablet ('5mg'$ ) by mouth 2 (two) times daily ('10mg'$  total) 06/03/21   Arfeen, Arlyce Harman, MD  ?omeprazole (PRILOSEC) 20 MG capsule Take 20 mg by mouth at bedtime. 06/12/19   [provider]  ?Prenatal Vit-Fe Fumarate-FA (PRENATAL MULTIVITAMIN) TABS tablet Take 1 tablet by mouth daily.    [provider]  ?promethazine (PHENERGAN) 25 MG tablet Take 25 mg by mouth every 8 (eight) hours as needed for nausea or vomiting. 04/27/19   [provider]  ?venlafaxine XR (EFFEXOR-XR) 150 MG 24 hr capsule Take 1 capsule (150 mg total) by mouth daily with breakfast. 06/03/21   Arfeen, Arlyce Harman, MD  ?warfarin (COUMADIN) 5 MG tablet Take 1.5-2 tablets (7.5-10 mg total) by mouth See admin instructions. Pt takes one and a half tablets (7.'5mg'$  total) on Sunday and Wednesday and 2 tablets ('10mg'$  total) all other days. ?Patient taking differently: Take 7.5-10 mg by mouth See admin instructions. 5 mg Friday, Monday ?7.5 mg Saturday,Sunday 01/03/18  Thurnell Lose, MD  ?   ? ?Allergies    ?Vicodin [hydrocodone-acetaminophen] and Morphine and related   ? ?Review of Systems   ?Review of Systems  ?Unable to perform ROS: Mental status change  ? ?Physical Exam ?Updated Vital Signs ?BP 126/75   Pulse (!) 102   Temp (!) 100.8 ?F (38.2 ?C) (Rectal)   Resp 18   SpO2 98%  ?Physical Exam ?Vitals and nursing note reviewed.  ?Constitutional:   ?   General: She is not in acute distress. ?   Appearance: She is not diaphoretic.  ?HENT:  ?   Head: Normocephalic and atraumatic.  ?   Mouth/Throat:  ?   Mouth: Mucous membranes are dry.  ?Eyes:  ?    Pupils: Pupils are equal, round, and reactive to light.  ?Cardiovascular:  ?   Rate and Rhythm: Regular rhythm. Tachycardia present.  ?   Heart sounds: Normal heart sounds.  ?Pulmonary:  ?   Effort: Pulmonary effort is normal.  ?   Breath sounds: Normal breath sounds.  ?Abdominal:  ?   Palpations: Abdomen is soft.  ?   Tenderness: There is no abdominal tenderness. There is no guarding.  ?Musculoskeletal:  ?   Left lower leg: No edema.  ?   Comments: Right AKA  ?Skin: ?   General: Skin is warm and dry.  ?   Findings: No erythema or rash.  ?Neurological:  ?   Mental Status: She is lethargic.  ?   GCS: GCS eye subscore is 2. GCS motor subscore is 4.  ?   Comments: Left side deficits from prior CVA limit ability to assess, does move left leg. Right AKA. Squeezes right hand, unsure if on command. Left side facial weakness.  ? ? ?ED Results / Procedures / Treatments   ?Labs ?(all labs ordered are listed, but only abnormal results are displayed) ?Labs Reviewed  ?COMPREHENSIVE METABOLIC PANEL - Abnormal; Notable for the following components:  ?    Result Value  ? Sodium 151 (*)   ? Chloride 115 (*)   ? Glucose, Bld 132 (*)   ? BUN 29 (*)   ? Creatinine, Ser 1.90 (*)   ? GFR, Estimated 31 (*)   ? All other components within normal limits  ?CBC WITH DIFFERENTIAL/PLATELET - Abnormal; Notable for the following components:  ? Hemoglobin 11.9 (*)   ? All other components within normal limits  ?PROTIME-INR - Abnormal; Notable for the following components:  ? Prothrombin Time 64.1 (*)   ? INR 7.6 (*)   ? All other components within normal limits  ?URINALYSIS, ROUTINE W REFLEX MICROSCOPIC - Abnormal; Notable for the following components:  ? Color, Urine AMBER (*)   ? APPearance CLOUDY (*)   ? Hgb urine dipstick LARGE (*)   ? Protein, ur 100 (*)   ? Leukocytes,Ua SMALL (*)   ? RBC / HPF >50 (*)   ? All other components within normal limits  ?APTT - Abnormal; Notable for the following components:  ? aPTT 86 (*)   ? All other  components within normal limits  ?AMMONIA - Abnormal; Notable for the following components:  ? Ammonia 49 (*)   ? All other components within normal limits  ?CK - Abnormal; Notable for the following components:  ? Total CK 1,211 (*)   ? All other components within normal limits  ?CULTURE, BLOOD (ROUTINE X 2)  ?CULTURE, BLOOD (ROUTINE X 2)  ?URINE CULTURE  ?EXPECTORATED SPUTUM ASSESSMENT W  GRAM STAIN, RFLX TO RESP C  ?LACTIC ACID, PLASMA  ?CREATININE, URINE, RANDOM  ?MAGNESIUM  ?PHOSPHORUS  ?SODIUM, URINE, RANDOM  ?LACTIC ACID, PLASMA  ?TSH  ?BLOOD GAS, VENOUS  ?APTT  ?PROTIME-INR  ?OSMOLALITY, URINE  ?PREALBUMIN  ?PROCALCITONIN  ?OSMOLALITY  ?CK  ?CK  ?CK  ?BASIC METABOLIC PANEL  ?BASIC METABOLIC PANEL  ?BASIC METABOLIC PANEL  ?COMPREHENSIVE METABOLIC PANEL  ?CBC  ?MAGNESIUM  ?PHOSPHORUS  ?HIV ANTIBODY (ROUTINE TESTING W REFLEX)  ?LEGIONELLA PNEUMOPHILA SEROGP 1 UR AG  ?STREP PNEUMONIAE URINARY ANTIGEN  ?I-STAT BETA HCG BLOOD, ED ([facility], [facility], AP ONLY)  ? ? ?EKG ?EKG Interpretation ? ?Date/Time:  Tuesday June 09 2021 23:07:11 EDT ?Ventricular Rate:  109 ?PR Interval:  219 ?QRS Duration: 78 ?QT Interval:  349 ?QTC Calculation: 470 ?R Axis:   49 ?Text Interpretation: Sinus tachycardia Prolonged PR interval Nonspecific T abnrm, anterolateral leads Since last tracing rate faster Confirmed by Dorie Rank [phone]) on 06/09/2021 11:18:04 PM ? ?Radiology ?DG Chest 1 View ? ?Result Date: 06/09/2021 ?CLINICAL DATA:  Altered mental status. EXAM: CHEST  1 VIEW COMPARISON:  Chest radiograph dated 02/14/2021. FINDINGS: Shallow inspiration. No focal consolidation, pleural effusion, pneumothorax. The cardiac silhouette is within limits. No acute osseous pathology. Simulator device over the left chest. IMPRESSION: No active cardiopulmonary disease. Electronically Signed   By: Anner Crete M.D.   On: 06/09/2021 22:43  ? ?CT Head Wo Contrast ? ?Result Date: 06/09/2021 ?CLINICAL DATA:  Altered mental status EXAM: CT HEAD WITHOUT CONTRAST  TECHNIQUE: Contiguous axial images were obtained from the base of the skull through the vertex without intravenous contrast. RADIATION DOSE REDUCTION: This exam was performed according to the departmental dose-optimization pr

## 2021-06-09 NOTE — Assessment & Plan Note (Addendum)
Most likely secondary to dehydration and UTI.  But also have to consider the possibility of intractable seizures.  Appreciate neurology consult May need continuous EEG.  May need additional brain imaging (MRI) if  able to tolerate. ?For now rehydrate and treat underlying infectious source ordered seizure precautions ?Neurology is consulted and aware telemetry neurology consult  ?May need ER to ER transfer for expedition of EEG. ?

## 2021-06-09 NOTE — ED Notes (Signed)
Call received from pt mother Ronita Hipps [phone] requesting rtn call for pt status/updates. ENMiles ?

## 2021-06-09 NOTE — Assessment & Plan Note (Signed)
On chronic Coumadin.  On hold secondary to supratherapeutic INR ?

## 2021-06-09 NOTE — ED Triage Notes (Signed)
Patient BIB [facility] from home. Her seizure medication was changed x1 week ago from keppra to [employer]. Family said she has not been eating or able to take her meds for the past few days. Family gave her an enema today. Left side deficits from previous stroke, right side above knee amputation. This new seizure medication has apparently made her more combative. EMS said first GCS was 6 and now it is 10. Was 89% room air on scene and they placed her on 2L [place] improved to 94%.  ? ?EMS gave 520m en route. ?22G left forearm ?

## 2021-06-09 NOTE — Assessment & Plan Note (Signed)
Of right lower extremity status post BKA ?

## 2021-06-09 NOTE — Assessment & Plan Note (Signed)
Restart home medications when able to tolerate p.o. ?

## 2021-06-09 NOTE — Assessment & Plan Note (Signed)
There is no left-sided side weakness.  Chronic  ?In 2004 she had brain aneurysms and while was undergoing a repair had a stroke which left her with seizure disorder ?

## 2021-06-09 NOTE — Assessment & Plan Note (Addendum)
-  SIRS criteria met with    ?   ?Component Value Date/Time  ? WBC 9.0 06/09/2021 2216  ? LYMPHSABS 1.2 06/09/2021 2216  ? LYMPHSABS 2.0 11/12/2020 1210  ? LYMPHSABS 2.1 01/12/2017 1125  ?  tachycardia    Fever RR >20 ?Today's Vitals  ? 06/09/21 2205 06/09/21 2215 06/09/21 2245 06/09/21 2300  ?BP: 111/83 94/63 103/69 98/70  ?Pulse: (!) 117 (!) 113 (!) 110 (!) 110  ?Resp: 17 (!) 32 18 (!) 37  ?Temp: (!) 100.8 ?F (38.2 ?C)     ?TempSrc: Rectal     ?SpO2: 93% 94% 98% 96%  ?PainSc: 0-No pain     ? ?There is no height or weight on file to calculate BMI. ? ?This patient meets SIRS Criteria and may be septic. ?  The recent clinical data is shown below. ?Vitals:  ? 06/09/21 2205 06/09/21 2215 06/09/21 2245 06/09/21 2300  ?BP: 111/83 94/63 103/69 98/70  ?Pulse: (!) 117 (!) 113 (!) 110 (!) 110  ?Resp: 17 (!) 32 18 (!) 37  ?Temp: (!) 100.8 ?F (38.2 ?C)     ?TempSrc: Rectal     ?SpO2: 93% 94% 98% 96%  ?   -Most likely source being   urinary  ? ? Patient meeting criteria for Severe sepsis with  ?  evidence of end organ damage/organ dysfunction such as  ?   acute metabolic encephalopathy ? Acute hypoxia requiring new supplemental oxygen, SpO2: 96 % ?   - Obtain serial lactic acid and procalcitonin level. ? - Initiated IV antibiotics in ER: ?Antibiotics Given (last 72 hours)   ? Date/Time Action Medication Dose Rate  ? 06/09/21 2311 New Bag/Given  ? cefTRIAXone (ROCEPHIN) 1 g in sodium chloride 0.9 % 100 mL IVPB 1 g 200 mL/hr  ?  ? ? ?Will continue first got rocephin but given possible aspiration PNA will change to Unasyn ? ? ? - await results of blood and urine culture ? - Rehydrate aggressively  ?Intravenous fluids were administered,  ?  ?11:27 PM ? ?

## 2021-06-09 NOTE — Subjective & Objective (Signed)
Patient coming from home reports that his seizure medicine has been changed about a week ago, Keppra to [employer]. ?Patient has not been eating and not taking her medications for the past few days she has known history of stroke with chronic left-sided weakness ?History of right-sided above-knee amputation ?Family thinks that her seizure medications made her more combative.  When EMS first got that she was satting 89% on room air oxygen 2 L improved to 94 EMS gave her 500 mL normal saline in route ?

## 2021-06-09 NOTE — Assessment & Plan Note (Signed)
Hold Coumadin was given a dose of vitamin K IV.  Repeat INR in a.m. May need additional doses at this point no evidence of bleeding ?

## 2021-06-09 NOTE — H&P (Addendum)
? ? Cassandra Andrade GYI:[id number] DOB: Mar 18, 1965 DOA: 06/09/2021 ?  ?PCP: Berkley Harvey, NP   ?Outpatient Specialists:   ?NEurology  Dr. April Manson ?  ? ?Patient arrived to ER on 06/09/21 at 2153 ?Referred by Attending Mesner, Corene Cornea, MD ? ? ?Patient coming from:   ? home Lives With family ?  ? ?Chief Complaint:   ?Chief Complaint  ?Patient presents with  ? Altered Mental Status  ? ? ?HPI: ?Cassandra Andrade is a 56 y.o. female with medical history significant of seizure disorder ? 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 ?Presented with confusion and combative ?Patient coming from home reports that his seizure medicine has been changed about a week ago, Keppra to [employer]. ?Patient has not been eating and not taking her medications for the past few days she has known history of stroke with chronic left-sided weakness ?History of right-sided above-knee amputation ?Family thinks that her seizure medications made her more combative.  When EMS first got that she was satting 89% on room air oxygen 2 L improved to 94 EMS gave her 500 mL normal saline in route ? Per family felt hot ot the touch, only withdrawing to pain ?She drank a little water yesterday and strawberry boost this AM, ?Family tired to keep giving her meds but she could not really swallow anything ? ? ? Initial COVID TEST in house  PCR testing  Pending ? ?Lab Results  ?Component Value Date  ? [place] NEGATIVE 02/24/2021  ? [place] NEGATIVE 02/22/2021  ? [place] NEGATIVE 02/13/2021  ? ?  ?Regarding pertinent Chronic problems:   ?  ?  ? Hypothyroidism:  ?Lab Results  ?Component Value Date  ? TSH 2.417 02/17/2013  ? on synthroid ?  Hx of CVA -  with residual deficits with weakness on the left ?  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   ? Chronic anemia - baseline hg Hemoglobin & Hematocrit   ?Recent Labs  ?  05/05/21 ?3500 05/26/21 ?9381 06/09/21 ?2216  ?HGB 12.3 11.7* 11.9*  ? ?  ?While in ER: ?   ? ?UA appears consistent with UTI ?ER gave IV vitamin K ?Have gotten 1.5L so far ? ?Ordered ? ?CT HEAD Stable examination with extensive encephalomalacia involving the ?right cerebral hemisphere and deep gray matter ? ?CXR - No active cardiopulmonary disease. ?  ? ?Following Medications were ordered in ER: ?Medications  ?sodium chloride 0.9 % bolus 1,000 mL (has no administration in time range)  ?cefTRIAXone (ROCEPHIN) 1 g in sodium chloride 0.9 % 100 mL IVPB (has no administration in time range)  ?phytonadione (VITAMIN K) 1 mg in dextrose 5 % 50 mL IVPB (has no administration in time range)  ?  ?_______________________________________________________ ?ER Provider Called:   Neurology   Dr. Drenda Freeze ?They Recommend admit to medicine send to [facility], rapid EEG and meds ordered, tele neuro consult for eval now ?  ?ED Triage Vitals [06/09/21 2205]  ?Enc Vitals Group  ?   BP 111/83  ?   Pulse Rate (!) 117  ?   Resp 17  ?   Temp (!) 100.8 ?F (38.2 ?C)  ?   Temp Source Rectal  ?   SpO2 93 %  ?   Weight   ?   Height   ?   Head Circumference   ?  Peak Flow   ?   Pain Score 0  ?   Pain Loc   ?   Pain Edu?   ?   Excl. in [place]?   ?YJEH(63)@    ? _________________________________________ ?Significant initial  Findings: ?Abnormal Labs Reviewed  ?COMPREHENSIVE METABOLIC PANEL - Abnormal; Notable for the following components:  ?    Result Value  ? Sodium 151 (*)   ? Chloride 115 (*)   ? Glucose, Bld 132 (*)   ? BUN 29 (*)   ? Creatinine, Ser 1.90 (*)   ? GFR, Estimated 31 (*)   ? All other components within normal limits  ?CBC WITH DIFFERENTIAL/PLATELET - Abnormal; Notable for the following components:  ? Hemoglobin 11.9 (*)   ? All other components within normal limits  ?PROTIME-INR - Abnormal; Notable for the following components:  ? Prothrombin Time 64.1 (*)   ? INR 7.6 (*)   ? All other components within normal limits   ?URINALYSIS, ROUTINE W REFLEX MICROSCOPIC - Abnormal; Notable for the following components:  ? Color, Urine AMBER (*)   ? APPearance CLOUDY (*)   ? Hgb urine dipstick LARGE (*)   ? Protein, ur 100 (*)   ? Leukocytes,Ua SMALL (*)   ? RBC / HPF >50 (*)   ? All other components within normal limits  ?APTT - Abnormal; Notable for the following components:  ? aPTT 86 (*)   ? All other components within normal limits  ? ?   ?ECG: Ordered ?Personally reviewed by me showing: ?HR : 109 ?Rhythm:Sinus tachycardia ?Prolonged PR interval ?Nonspecific T abnrm, anterolateral leads ?QTC 470 ?  ?____________________ ?This patient meets SIRS Criteria and may be septic. ?The recent clinical data is shown below. ?Vitals:  ? 06/09/21 2205 06/09/21 2300  ?BP: 111/83 98/70  ?Pulse: (!) 117 (!) 110  ?Resp: 17 (!) 37  ?Temp: (!) 100.8 ?F (38.2 ?C)   ?TempSrc: Rectal   ?SpO2: 93% 96%  ?   ?WBC ? ?   ?Component Value Date/Time  ? WBC 9.0 06/09/2021 2216  ? LYMPHSABS 1.2 06/09/2021 2216  ? LYMPHSABS 2.0 11/12/2020 1210  ? LYMPHSABS 2.1 01/12/2017 1125  ? MONOABS 0.6 06/09/2021 2216  ? MONOABS 0.4 01/12/2017 1125  ? EOSABS 0.0 06/09/2021 2216  ? EOSABS 0.1 11/12/2020 1210  ? EOSABS 0.1 09/19/2013 1311  ? BASOSABS 0.0 06/09/2021 2216  ? BASOSABS 0.0 11/12/2020 1210  ? BASOSABS 0.0 01/12/2017 1125  ?  ?Lactic Acid, Venous ?   ?Component Value Date/Time  ? LATICACIDVEN 1.6 06/09/2021 2216  ? ? ? ?Procalcitonin   Ordered ?    ? UA  evidence of UTI    ?  ?Urine analysis: ?   ?Component Value Date/Time  ? COLORURINE AMBER (A) 06/09/2021 2216  ? APPEARANCEUR CLOUDY (A) 06/09/2021 2216  ? LABSPEC 1.027 06/09/2021 2216  ? PHURINE 5.0 06/09/2021 2216  ? GLUCOSEU NEGATIVE 06/09/2021 2216  ? HGBUR LARGE (A) 06/09/2021 2216  ? [place] NEGATIVE 06/09/2021 2216  ? [place] NEGATIVE 06/09/2021 2216  ? PROTEINUR 100 (A) 06/09/2021 2216  ? UROBILINOGEN 0.2 05/05/2013 1110  ? NITRITE NEGATIVE 06/09/2021 2216  ? LEUKOCYTESUR SMALL (A) 06/09/2021 2216  ? ?   ? ?_______________________________________________ ?Hospitalist was called for admission for   Altered mental status,  ?Sepsis with acute renal failure without septic shock, due to unspecified organism, unspecified acute renal failure type ([place]) ?   ?Supratherapeutic INR ?   ?Hypernatremia ?   ? ?The following Work  up has been ordered so far: ? ?Orders Placed This Encounter  ?Procedures  ? Culture, blood (Routine x 2)  ? Urine Culture  ? CT Head Wo Contrast  ? DG Chest 1 View  ? Comprehensive metabolic panel  ? Lactic acid, plasma  ? CBC with Differential  ? Protime-INR  ? Urinalysis, Routine w reflex microscopic  ? APTT  ? Diet NPO time specified  ? Cardiac monitoring  ? Document height and weight  ? Assess and Document Glasgow Coma Scale  ? Document vital signs within 1-hour of fluid bolus completion.  Notify provider of abnormal vital signs despite fluid resuscitation.  ? Refer to Sidebar Report: Sepsis Bundle ED/IP  ? Notify provider for difficulties obtaining IV access  ? Initiate [place] Fluid Protocol  ? pharmacy consult  ? Consult to hospitalist  ? Pulse oximetry, continuous  ? I-Stat beta hCG blood, ED  ? ED EKG 12-Lead  ? EKG 12-Lead  ? Insert peripheral IV X 1  ?  ? ?OTHER Significant initial  Findings: ? ?labs showing: ? ?  ?Recent Labs  ?Lab 06/09/21 ?2216  ?NA 151*  ?K 4.0  ?CO2 25  ?GLUCOSE 132*  ?BUN 29*  ?CREATININE 1.90*  ?CALCIUM 9.1  ?  ?Cr   Up from baseline see below ?Lab Results  ?Component Value Date  ? CREATININE 1.90 (H) 06/09/2021  ? CREATININE 0.98 05/26/2021  ? CREATININE 1.10 (H) 05/05/2021  ? ? ?Recent Labs  ?Lab 06/09/21 ?2216  ?AST 33  ?ALT 17  ?ALKPHOS 82  ?BILITOT 0.6  ?PROT 7.7  ?ALBUMIN 3.6  ? ?Lab Results  ?Component Value Date  ? CALCIUM 9.1 06/09/2021  ? PHOS 2.2 (L) 03/03/2018  ? ?    ?   ?Plt: ?Lab Results  ?Component Value Date  ? PLT 229 06/09/2021  ?  ?COVID-19 Labs ? ?No results for input(s): DDIMER, FERRITIN, LDH, CRP in the last 72 hours. ? ?Lab Results  ?Component  Value Date  ? [place] NEGATIVE 02/24/2021  ? [place] NEGATIVE 02/22/2021  ? [place] NEGATIVE 02/13/2021  ? ?Ordered: ?  ?ABG ?   ?Component Value Date/Time  ? TCO2 29 02/13/2021 2227  ?  ?Recent L

## 2021-06-10 ENCOUNTER — Observation Stay (HOSPITAL_COMMUNITY): Payer: Medicare Other

## 2021-06-10 DIAGNOSIS — J690 Pneumonitis due to inhalation of food and vomit: Secondary | ICD-10-CM | POA: Diagnosis present

## 2021-06-10 DIAGNOSIS — K5900 Constipation, unspecified: Secondary | ICD-10-CM | POA: Diagnosis present

## 2021-06-10 DIAGNOSIS — A419 Sepsis, unspecified organism: Secondary | ICD-10-CM | POA: Diagnosis present

## 2021-06-10 DIAGNOSIS — R569 Unspecified convulsions: Secondary | ICD-10-CM | POA: Diagnosis not present

## 2021-06-10 DIAGNOSIS — R6520 Severe sepsis without septic shock: Secondary | ICD-10-CM | POA: Diagnosis present

## 2021-06-10 DIAGNOSIS — E876 Hypokalemia: Secondary | ICD-10-CM | POA: Diagnosis present

## 2021-06-10 DIAGNOSIS — F32A Depression, unspecified: Secondary | ICD-10-CM | POA: Diagnosis present

## 2021-06-10 DIAGNOSIS — Z833 Family history of diabetes mellitus: Secondary | ICD-10-CM | POA: Diagnosis not present

## 2021-06-10 DIAGNOSIS — Z86718 Personal history of other venous thrombosis and embolism: Secondary | ICD-10-CM | POA: Diagnosis not present

## 2021-06-10 DIAGNOSIS — N390 Urinary tract infection, site not specified: Secondary | ICD-10-CM | POA: Diagnosis present

## 2021-06-10 DIAGNOSIS — M6282 Rhabdomyolysis: Secondary | ICD-10-CM | POA: Diagnosis present

## 2021-06-10 DIAGNOSIS — E039 Hypothyroidism, unspecified: Secondary | ICD-10-CM | POA: Diagnosis present

## 2021-06-10 DIAGNOSIS — D6862 Lupus anticoagulant syndrome: Secondary | ICD-10-CM | POA: Diagnosis present

## 2021-06-10 DIAGNOSIS — R748 Abnormal levels of other serum enzymes: Secondary | ICD-10-CM | POA: Diagnosis present

## 2021-06-10 DIAGNOSIS — G9341 Metabolic encephalopathy: Secondary | ICD-10-CM | POA: Diagnosis present

## 2021-06-10 DIAGNOSIS — Z95828 Presence of other vascular implants and grafts: Secondary | ICD-10-CM | POA: Diagnosis not present

## 2021-06-10 DIAGNOSIS — I6990 Unspecified sequelae of unspecified cerebrovascular disease: Secondary | ICD-10-CM | POA: Diagnosis not present

## 2021-06-10 DIAGNOSIS — N179 Acute kidney failure, unspecified: Secondary | ICD-10-CM | POA: Diagnosis present

## 2021-06-10 DIAGNOSIS — E43 Unspecified severe protein-calorie malnutrition: Secondary | ICD-10-CM | POA: Diagnosis present

## 2021-06-10 DIAGNOSIS — E860 Dehydration: Secondary | ICD-10-CM | POA: Diagnosis present

## 2021-06-10 DIAGNOSIS — Z85830 Personal history of malignant neoplasm of bone: Secondary | ICD-10-CM | POA: Diagnosis not present

## 2021-06-10 DIAGNOSIS — E871 Hypo-osmolality and hyponatremia: Secondary | ICD-10-CM | POA: Diagnosis present

## 2021-06-10 DIAGNOSIS — R4182 Altered mental status, unspecified: Secondary | ICD-10-CM | POA: Diagnosis present

## 2021-06-10 DIAGNOSIS — Z20822 Contact with and (suspected) exposure to covid-19: Secondary | ICD-10-CM | POA: Diagnosis present

## 2021-06-10 DIAGNOSIS — Z89611 Acquired absence of right leg above knee: Secondary | ICD-10-CM | POA: Diagnosis not present

## 2021-06-10 DIAGNOSIS — Z818 Family history of other mental and behavioral disorders: Secondary | ICD-10-CM | POA: Diagnosis not present

## 2021-06-10 DIAGNOSIS — G40919 Epilepsy, unspecified, intractable, without status epilepticus: Secondary | ICD-10-CM | POA: Diagnosis present

## 2021-06-10 DIAGNOSIS — E870 Hyperosmolality and hypernatremia: Secondary | ICD-10-CM | POA: Diagnosis present

## 2021-06-10 DIAGNOSIS — I69354 Hemiplegia and hemiparesis following cerebral infarction affecting left non-dominant side: Secondary | ICD-10-CM | POA: Diagnosis not present

## 2021-06-10 LAB — BASIC METABOLIC PANEL
Anion gap: 7 (ref 5–15)
BUN: 17 mg/dL (ref 6–20)
CO2: 24 mmol/L (ref 22–32)
Calcium: 7.6 mg/dL — ABNORMAL LOW (ref 8.9–10.3)
Chloride: 112 mmol/L — ABNORMAL HIGH (ref 98–111)
Creatinine, Ser: 1.04 mg/dL — ABNORMAL HIGH (ref 0.44–1.00)
GFR, Estimated: >60 mL/min (ref >60)
Glucose, Bld: 288 mg/dL — ABNORMAL HIGH (ref 70–99)
Potassium: 3.5 mmol/L (ref 3.5–5.1)
Sodium: 143 mmol/L (ref 135–145)

## 2021-06-10 LAB — CBG MONITORING, ED: Glucose-Capillary: 92 mg/dL (ref 70–99)

## 2021-06-10 LAB — COMPREHENSIVE METABOLIC PANEL
ALT: 17 U/L (ref 0–44)
AST: 36 U/L (ref 15–41)
Albumin: 3.1 g/dL — ABNORMAL LOW (ref 3.5–5.0)
Alkaline Phosphatase: 74 U/L (ref 38–126)
Anion gap: 6 (ref 5–15)
BUN: 24 mg/dL — ABNORMAL HIGH (ref 6–20)
CO2: 25 mmol/L (ref 22–32)
Calcium: 8.1 mg/dL — ABNORMAL LOW (ref 8.9–10.3)
Chloride: 119 mmol/L — ABNORMAL HIGH (ref 98–111)
Creatinine, Ser: 1.4 mg/dL — ABNORMAL HIGH (ref 0.44–1.00)
GFR, Estimated: 45 mL/min — ABNORMAL LOW (ref >60)
Glucose, Bld: 127 mg/dL — ABNORMAL HIGH (ref 70–99)
Potassium: 3.9 mmol/L (ref 3.5–5.1)
Sodium: 150 mmol/L — ABNORMAL HIGH (ref 135–145)
Total Bilirubin: 0.4 mg/dL (ref 0.3–1.2)
Total Protein: 6.7 g/dL (ref 6.5–8.1)

## 2021-06-10 LAB — HIV ANTIBODY (ROUTINE TESTING W REFLEX): HIV Screen 4th Generation wRfx: NONREACTIVE

## 2021-06-10 LAB — CBC
HCT: 36 % (ref 36.0–46.0)
Hemoglobin: 11 g/dL — ABNORMAL LOW (ref 12.0–15.0)
MCH: 28.3 pg (ref 26.0–34.0)
MCHC: 30.6 g/dL (ref 30.0–36.0)
MCV: 92.5 fL (ref 80.0–100.0)
Platelets: 205 10*3/uL (ref 150–400)
RBC: 3.89 MIL/uL (ref 3.87–5.11)
RDW: 13.6 % (ref 11.5–15.5)
WBC: 9 10*3/uL (ref 4.0–10.5)
nRBC: 0 % (ref 0.0–0.2)

## 2021-06-10 LAB — OSMOLALITY: Osmolality: 318 mOsm/kg — ABNORMAL HIGH (ref 275–295)

## 2021-06-10 LAB — CK
Total CK: 1579 U/L — ABNORMAL HIGH (ref 38–234)
Total CK: 1367 U/L — ABNORMAL HIGH (ref 38–234)

## 2021-06-10 LAB — PREALBUMIN: Prealbumin: 10.9 mg/dL — ABNORMAL LOW (ref 18–38)

## 2021-06-10 LAB — PROTIME-INR
INR: 4.3 (ref 0.8–1.2)
Prothrombin Time: 41 seconds — ABNORMAL HIGH (ref 11.4–15.2)

## 2021-06-10 LAB — BLOOD GAS, VENOUS
Acid-Base Excess: 0.9 mmol/L (ref 0.0–2.0)
Bicarbonate: 27.8 mmol/L (ref 20.0–28.0)
O2 Saturation: 68.9 %
Patient temperature: 37
pCO2, Ven: 54 mmHg (ref 44–60)
pH, Ven: 7.32 (ref 7.25–7.43)
pO2, Ven: 40 mmHg (ref 32–45)

## 2021-06-10 LAB — OSMOLALITY, URINE: Osmolality, Ur: 682 mOsm/kg (ref 300–900)

## 2021-06-10 LAB — PHOSPHORUS: Phosphorus: 3.1 mg/dL (ref 2.5–4.6)

## 2021-06-10 LAB — MAGNESIUM: Magnesium: 2 mg/dL (ref 1.7–2.4)

## 2021-06-10 LAB — STREP PNEUMONIAE URINARY ANTIGEN: Strep Pneumo Urinary Antigen: NEGATIVE

## 2021-06-10 LAB — AMMONIA: Ammonia: 49 umol/L — ABNORMAL HIGH (ref 9–35)

## 2021-06-10 LAB — TSH: TSH: 1.252 u[IU]/mL (ref 0.350–4.500)

## 2021-06-10 LAB — PROCALCITONIN: Procalcitonin: 1.92 ng/mL

## 2021-06-10 LAB — LACTIC ACID, PLASMA: Lactic Acid, Venous: 1 mmol/L (ref 0.5–1.9)

## 2021-06-10 LAB — APTT: aPTT: 77 seconds — ABNORMAL HIGH (ref 24–36)

## 2021-06-10 MED ORDER — ACETAMINOPHEN 650 MG RE SUPP: 650.0000 mg | Freq: Four times a day (QID) | RECTAL | Status: DC | PRN

## 2021-06-10 MED ORDER — SODIUM CHLORIDE 0.9 % IV SOLN
3.0000 g | Freq: Four times a day (QID) | INTRAVENOUS | Status: AC
  Administered 2021-06-10 – 2021-06-15 (×22): 3 g via INTRAVENOUS
  Filled 2021-06-10 (×23): qty 8

## 2021-06-10 MED ORDER — SODIUM CHLORIDE 0.9 % IV SOLN
200.0000 mg | Freq: Two times a day (BID) | INTRAVENOUS | Status: DC
  Administered 2021-06-10 – 2021-06-15 (×12): 200 mg via INTRAVENOUS
  Filled 2021-06-10 (×15): qty 20

## 2021-06-10 MED ORDER — THIAMINE HCL 100 MG/ML IJ SOLN
100.0000 mg | Freq: Every day | INTRAMUSCULAR | Status: DC
  Administered 2021-06-10 – 2021-06-17 (×8): 100 mg via INTRAVENOUS
  Filled 2021-06-10 (×8): qty 2

## 2021-06-10 MED ORDER — LACTATED RINGERS IV SOLN: INTRAVENOUS | Status: DC

## 2021-06-10 MED ORDER — ACETAMINOPHEN 325 MG PO TABS
650.0000 mg | ORAL_TABLET | Freq: Four times a day (QID) | ORAL | Status: DC | PRN
  Administered 2021-06-13: 650 mg via ORAL
  Filled 2021-06-10 (×2): qty 2

## 2021-06-10 MED ORDER — DEXTROSE 5 % IV SOLN: INTRAVENOUS | Status: DC

## 2021-06-10 MED ORDER — MILK AND MOLASSES ENEMA: 1.0000 | Freq: Once | RECTAL | Status: DC

## 2021-06-10 MED ORDER — BISACODYL 10 MG RE SUPP: 10.0000 mg | Freq: Every day | RECTAL | Status: DC | PRN

## 2021-06-10 MED ORDER — LAMOTRIGINE 25 MG PO TABS
350.0000 mg | ORAL_TABLET | Freq: Two times a day (BID) | ORAL | Status: DC
  Administered 2021-06-11 – 2021-06-17 (×13): 350 mg via ORAL
  Filled 2021-06-10 (×13): qty 2

## 2021-06-10 MED ORDER — LEVOTHYROXINE SODIUM 100 MCG/5ML IV SOLN
25.0000 ug | Freq: Every day | INTRAVENOUS | Status: DC
  Filled 2021-06-10: qty 5

## 2021-06-10 MED ORDER — SODIUM CHLORIDE 0.9 % IV SOLN
100.0000 mg | Freq: Two times a day (BID) | INTRAVENOUS | Status: DC
  Administered 2021-06-10 – 2021-06-15 (×13): 100 mg via INTRAVENOUS
  Filled 2021-06-10 (×17): qty 10

## 2021-06-10 MED ORDER — SODIUM CHLORIDE 0.9 % IV SOLN
3.0000 g | Freq: Three times a day (TID) | INTRAVENOUS | Status: DC
  Administered 2021-06-10 (×2): 3 g via INTRAVENOUS
  Filled 2021-06-10 (×2): qty 8

## 2021-06-10 NOTE — ED Notes (Signed)
Teleneuro cart set up in room ?

## 2021-06-10 NOTE — Assessment & Plan Note (Signed)
TheIn the setting of severely dehydration ?Obtain urine electrolytes ?Follow renal status ? ?Given UTI check CT renal to rule out obstructing ?

## 2021-06-10 NOTE — ED Notes (Signed)
Pt's brief and linens changed  

## 2021-06-10 NOTE — Progress Notes (Signed)
SLP Cancellation Note ? ?Patient Details ?Name: Cassandra Andrade ?MRN: [id number] ?DOB: 1966/01/21 ? ? ?Cancelled treatment:       Reason Eval/Treat Not Completed: (P) Fatigue/lethargy limiting ability to participate (Per RN, Museum/gallery conservator, pt is not adequately alert for eval, requested she contact SLP if wakens. Will continue efforts.) ? ?Kathleen Lime, MS CCC SLP ?Acute Rehab Services ?Office [phone] ?Pager [phone] ? ? ?Macario Golds ?06/10/2021, 8:14 AM ? ? ?

## 2021-06-10 NOTE — ED Notes (Addendum)
Report to Pilar Jarvis., RN at [facility].  [facility] called and ride arranged.  ED RN to await [facility] arrival to transport pt to [facility] [id number]. ?

## 2021-06-10 NOTE — ED Notes (Signed)
Pt responsive to touch. Pt tried speaking and was able to grasp my hand. ?

## 2021-06-10 NOTE — Assessment & Plan Note (Signed)
Order bowel regimen 

## 2021-06-10 NOTE — Progress Notes (Signed)
Pharmacy Antibiotic Note ? ?Cassandra Andrade is a 56 y.o. female admitted on 06/09/2021 with sepsis, AMS, aspiration pneumonia.  Pharmacy has been consulted for Unasyn dosing. ? 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 ? ?Plan: ?Unasyn 3gm IV q8h ?Follow renal function ?Follow culture results and sensitivities ? ?  ? ?Temp (24hrs), Avg:100.8 ?F (38.2 ?C), Min:100.8 ?F (38.2 ?C), Max:100.8 ?F (38.2 ?C) ? ?Recent Labs  ?Lab 06/09/21 ?2216  ?WBC 9.0  ?CREATININE 1.90*  ?LATICACIDVEN 1.6  ?  ?Estimated Creatinine Clearance: 35.8 mL/min (A) (by C-G formula based on SCr of 1.9 mg/dL (H)).   ? ?Allergies  ?Allergen Reactions  ? Vicodin [Hydrocodone-Acetaminophen] Nausea And Vomiting  ? Morphine And Related Rash  ?  Allergic reaction only in iv form  ? ? ?Antimicrobials this admission: ?4/18 Ceftriaxone x 1 ?4/19 Unasyn >>   ? ?Dose adjustments this admission: ?  ? ?Microbiology results: ?4/18 BCx:   ?4/18 UCx:    ?  ? ?Thank you for allowing pharmacy to be a part of this patient?s care. ? ?Everette Rank, PharmD ?06/10/2021 1:24 AM ? ?

## 2021-06-10 NOTE — Assessment & Plan Note (Signed)
Will continue to rehydrate ?

## 2021-06-10 NOTE — ED Notes (Signed)
Pt's mother called, updated given on pt's plan of care and status.  Pt's mother states understanding.  ED RN informed mother that pt is going to be transferred to [facility] for admission and further eval.  Mother states understanding and acceptance.  ?

## 2021-06-10 NOTE — Progress Notes (Signed)
[facility] attached to pt per MD Kirkpatrick's  orders.  ?

## 2021-06-10 NOTE — Assessment & Plan Note (Addendum)
Ct abdomen showing possible aspiration pna ?Will cover with Unasyn ?Obtain sputum culture ?Keep NPO  ?Aspiration precautions ? ?

## 2021-06-10 NOTE — Hospital Course (Addendum)
Patient is a 56 y.o. 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  In the ED, patient was noted to be hypernatremic with acute kidney injury and UTI.  Patient was also seen by telemetry neurology who made changes to seizure medication regimen.  Patient was then considered for admission to the hospital. ? ?Assessment and Plan: ?Principal Problem: ?  Acute metabolic encephalopathy ?Active Problems: ?  Sepsis ([place]) ?  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  UTI (urinary tract infection) ?  Hypothyroid ?  AKI (acute kidney injury) ([place]) ?  Depression ?  DVT, bilateral lower limbs ([place]) ?  Supratherapeutic INR ?  Elevated CK ?  Dehydration ?  Hypernatremia ?  S/P BKA (below knee amputation) ([place]) ?  Seizure ([place]) ?  Lupus anticoagulant disorder ([place]) ?  Cerebrovascular accident, late effects ?  Personal history of osteosarcoma ?  Aspiration pneumonia ([place]) ?  Constipation ?  Protein-calorie malnutrition, severe ?  ?Acute metabolic encephalopathy ?Likely multifactorial secondary to UTI, dehydration and hypernatremia and possible seizures..   Neurology on board. Received  EEG monitoring.  EEG reported 06/13/21 shows evidence of epileptogenicity arising from the right frontocentral region.  Currently on IV briviact '100mg'$  BID,  lamotrignine '350mg'$  BID, and vimpat '200mg'$  bid. MRI of the brain done 06/15/2021 without any acute changes.  Mild improvement in encephalopathy. ? ?Rule out seizures and status epilepticus.  Received continuous EEG.  On brivaracetam, Vimpat.  Continue to hold venlafaxine and olanzapine.   MRI of the brain without any acute findings..  Patient  does have a VNS.  Neurology on board, follow recommendations ? ?Sepsis ([place]) secondary to UTI. ?Patient had  fever with tachycardia and urinalysis showed  UTI.  Received IV Unasyn and has completed course 5-day course.  Urine culture showing multiple species at this time.  Blood cultures negative in 4 days.   No leukocytosis or fever at this time. ?  ?S/P BKA (below knee amputation) ([place]) ?Chronic stable ?  ?Hypothyroidism ?On IV Synthroid at this time.  Resume oral when mental status improves. On dysphagia 1 diet at this time. ?   ?DVT, bilateral lower limbs ([place]) ?-Presented with supratherapeutic INR.  On Coumadin as outpatient..  INR of 2.5 today.  Pharmacy to manage.  ?  ?Lupus anticoagulant disorder ([place]) ?On Coumadin as outpatient.   Pharmacy to manage.   ?  ?Personal history of osteosarcoma ?Status post right BKA. ?  ?Supratherapeutic INR ?Patient presented with INR of 7.6.   Pharmacy managing. ?  ?Hypernatremia ?On D5 water, rate has been decreased to 50 mill per hour..  Latest sodium of 138. ? ?Mild hypokalemia.  Improved after replacement.  Latest potassium of 3.7 ? ?Dehydration ?Patient with decreased oral intake at home.  On D5 water at this time..  On dysphagia 1 diet at this time. ?  ?Elevated CK, likely mild rhabdomyolysis. ?Improved. ?  ?AKI (acute kidney injury) ([place]) ?Improved.  Likely secondary to volume depletion.  Latest creatinine  at 0.9 ? ?Depression ?On Effexor, Zyprexa at home.   Resume when appropriate.  Currently on hold. ?  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  Currently on IV AED. ?  ?Aspiration  pneumonia ([place]) ?Likely secondary to metabolic encephalopathy.  Completed Unasyn.    Speech therapy on board and recommend dysphagia 1 diet at this time ? ?Severe protein calorie malnutrition. ?Present on admission.  Seen by dietitian.  On Ensure supplements ? ?Goals of care.  Palliative care on board and plan is to continue with full scope of treatment. ?  ?

## 2021-06-10 NOTE — Assessment & Plan Note (Signed)
At this point unable to tolerate p.o. medications.  Restart when unable ?

## 2021-06-10 NOTE — Progress Notes (Signed)
PHARMACY NOTE:  ANTIMICROBIAL RENAL DOSAGE ADJUSTMENT ? ?Current antimicrobial regimen includes a mismatch between antimicrobial dosage and estimated renal function.  As per policy approved by the Pharmacy & Therapeutics and Medical Executive Committees, the antimicrobial dosage will be adjusted accordingly. ? ?Current antimicrobial dosage:  Unasyn 3g IV q8 ? ?Indication: ?UTI, aspiriation PNA ? ?Renal Function: ? ?Estimated Creatinine Clearance: 48.6 mL/min (A) (by C-G formula based on SCr of 1.4 mg/dL (H)). ?'[]'$      On intermittent HD, scheduled: ?'[]'$      On CRRT ?   ?Antimicrobial dosage has been changed to:  3g IV q6 ? ?Additional comments: ? ? ?Thank you for allowing pharmacy to be a part of this patient's care. ? ?Kara Mead, [facility] ?06/10/2021 9:52 AM ?

## 2021-06-10 NOTE — Assessment & Plan Note (Signed)
Difficult to manage epilepsy.  Neurology is aware.  Ordered IV Keppra and Vimpat defer to neurology for further management   ?

## 2021-06-10 NOTE — ED Notes (Signed)
Teleneuro cart at bedside, assessment in process with nurse ?

## 2021-06-10 NOTE — Consult Note (Signed)
? ?  [facility] TeleNeurology Consult Services ? ?Stat Consult ? ?Patient Name:   Cassandra Andrade, Cassandra Andrade ?Date of Birth:   [birth year] ?Identification Number:   MRN - [id number] ?Date of Service:   06/09/2021 23:58:05 ? ?Diagnosis: ?      G93.49 - Encephalopathy Multifactorial ? ?Impression ?Patient is currently altered with multiple reasons. Patient was found to be dehydrated with Acute kidney injury, urinary tract infections. Patient with no prior history of status epilepticus recently seems less likely given all the other metabolic derangement. Patient was recently being weaned off keppra for briviact will order her home regimen for now. Was able to convert most of them to IV except for lamotrignine in which she will need enteric access to avoid missing multiple dosage. ? ? ?Recommendations: ?Our recommendations are outlined below. ? ?Nursing Recommendations: ?Maintain Euglycemia and Euthermia ?Once stable neuro checks q 4hrs ? ?Seizure precautions: ?Seizure precautions including no driving for state mandated time frame were discussed with patient with clear understanding ? ?Free Text: ?Briviact '100mg'$  BID Clonazepam '1mg'$  BID PRN Lamotrigine '350mg'$  BID Vimpat '200mg'$  BID ? ? ?---------------------------------------------------------------------------------------------------- ? ? ? ?Metrics: ?[facility] Notification Time: 06/09/2021 44:01:02 ?Stamp Time: 06/09/2021 23:58:05 ?Callback Response Time: 06/09/2021 23:58:45 ? ? ?CT HEAD: ?As Per Radiologist CT Head Showed No Acute Hemorrhage or Acute Core Infarct ? ? ? ?---------------------------------------------------------------------------------------------------- ? ?Chief Complaint: ?Seizure ? ?History of Present Illness: ?Patient is a 56 year old Female. ?Past medical history of stroke, seizures, SAH, DVT/PE (warfarin presented with altered mental status. had a medication change 1 week ago - hasn't been taking her medications. Pt not verbal now, unclear how long she has  been non-verbal. Hx. given my family. ?Patient was admitted for UTI ?  ? ?Past Medical History: ?     Stroke ?     Seizures ? ?Medications: ? ?Anticoagulant use:  Yes warfarin ?No Antiplatelet use ?Reviewed EMR for current medications ? ?Allergies:  ?Reviewed ? ?Social History: ?Unable To Obtain Due To Patient Status : Patient Is Obtunded/ Comatose ? ?Family History: ? ?There is no family history of premature cerebrovascular disease pertinent to this consultation ? ?ROS : ?14 Points Review of Systems was performed and was negative except mentioned in HPI. ? ?Past Surgical History: ?There Is No Surgical History Contributory To Today's Visit ? ?  ?Examination: ?BP(118/75), Pulse(102), ? ?Neuro Exam: ?  ? ?With noxious stimuli open eyes no track not following commands. ?Withdraws with RUE . Left hemiplegia. ?Mute ?  ? ? ? ? ?Patient / Family was informed the Neurology Consult would occur via TeleHealth consult by way of interactive audio and video telecommunications and consented to receiving care in this manner. ? ?Patient is being evaluated for possible acute neurologic impairment and high probability of imminent or life - threatening deterioration.I spent total of 35 minutes providing care to this patient, including time for face to face visit via telemedicine, review of medical records, imaging studies and discussion of findings with providers, the patient and / or family. ? ? ?Dr Cassandra Andrade ? ? ?[facility] ?[phone] ? ?Case [id number] ?

## 2021-06-10 NOTE — Consult Note (Signed)
Neurology Consultation ?Reason for Consult: Altered Mental status ?Referring Physician: Rhetta Mura ? ?CC: AMS ? ?History is obtained from:Chart review ? ?HPI: Afra Liska is a 56 y.o. female with a history of intracranial hemorrhage, seizures who presents with altered mental status.  At baseline, she is able to speak and follow commands.  She apparently has good strength in all extremities.  She was brought into the emergency department yesterday due to altered mental status.  She began to get confused over the past week, and due to this missed some of her seizure medicines.  Given her confusion, EMS was called to transport the patient and she was brought into the emergency department at [facility].  There she was found to have a fever of 100.8 with source identified as a urinary tract infection, AKI, hyponatremia.  She has been admitted for further evaluation. ? ? ? ?ROS:  Unable to obtain due to altered mental status.  ? ?Past Medical History:  ?Diagnosis Date  ? Anxiety   ? Arthritis   ? Cancer [facility])   ? Osteosarcoma, right leg  ? CVA (cerebrovascular accident due to intracerebral hemorrhage) ([place]) 2004  ? Depression   ? DVT (deep venous thrombosis) ([place])   ? Left leg  ? MRSA (methicillin resistant Staphylococcus aureus)   ? S/P BKA (below knee amputation) unilateral ([place])   ? Right   ? Seizures ([place])   ? Thyroid disease   ? 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 ? ? ?Family History  ?Problem Relation Age of Onset  ? Anxiety disorder Mother   ? Diabetes Father   ? Seizures Neg Hx   ? ? ? ?Social History:  reports that she has never smoked. She has never used smokeless tobacco. She reports that she does not drink alcohol and does not use drugs. ? ? ?Exam: ?Current vital signs: ?BP 117/69 (BP Location: Left Leg)   Pulse 91   Temp 98.1 ?F (36.7 ?C) (Oral)   Resp 19   SpO2 100%  ?Vital signs in last 24 hours: ?Temp:  [97.9 ?F (36.6 ?C)-98.2 ?F (36.8 ?C)] 98.1 ?F (36.7 ?C) (04/19 2341) ?Pulse  Rate:  [41-105] 91 (04/19 2341) ?Resp:  [13-28] 19 (04/19 1941) ?BP: (91-155)/(59-117) 117/69 (04/19 2341) ?SpO2:  [93 %-100 %] 100 % (04/19 2341) ? ? ?Physical Exam  ?Constitutional: Appears well-developed and well-nourished.  ? ?Neuro: ?Mental Status: ?Patient is awake, she is able to follow some simple commands including sticking out her tongue, but appears slightly agitated.  I am unable able to understand any of her speech. ?Cranial Nerves: ?II: She appears to fixate visual stimuli in both visual hemi-fields. Pupils are reactive bilaterally. ?III,IV, VI: Her gaze is disconjugate with right eye hourly deviated compared to the left, but she does look to both the right and left crossing midline.  ?VII: Facial movement is symmetric.  ?Motor: ?She holds her right arm in the air, moves both extremities well, when I reposition her right arm it does relax.  May be slightly increased tone. ?sensory: ?She response to noxious stimulation in all four extremities ?Cerebellar: ?She does not perform ? ? ? ? ?I have reviewed labs in epic and the results pertinent to this consultation are: ?CK 1211-> 1579-> 1367 ? ? ?I have reviewed the images obtained: CT hea d- negative.  ? ?Impression: 56 year old female with a history of hemorrhagic stroke and seizures who presents with altered mental status.  I am concerned that she may have been having  seizures, and therefore used a stat cerebel, without definite ongoing seizures on my initial review. She is npo currently, but if not improved enough to take PO by the morning, would place NG tube to start lamotrigine. With increased CK and some recent changes to her neuroleptics, I think that NMS has to be considered as well.  ? ?Recommendations: ?1) Continue vimpat '200mg'$  BID.  ?2) Continue briviact '100mg'$  bID ?3) Restart lamotrigine ASAP ?4) hold olanzepine.  ?5) MRI brain ?6) LTM EEG ? ? ?Roland Rack, MD ?[facility] ?[phone] ? ?If 7pm- 7am, please page neurology  on call as listed in [place]. ? ?

## 2021-06-10 NOTE — Progress Notes (Signed)
?Progress Note ? ? ?PatientMalcolm Andrade NID:[id number] DOB: [birth year] DOA: 06/09/2021     0 ?DOS: the patient was seen and examined on 06/10/2021 ?  ?Brief hospital course: ? 56 y.o. female with medical history significant of seizure disorder ? 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 Pt presented with acute confusion and mental status changeover the past 3-4 days prior to admit after having seizure meds changed one week prior (Keppra to Briviact).  ? ?In ED, pt was found to be hypernatremic with ARF and UTI. Pt was initially seen by Tele neuro who had made changes to seizure regimen. Hospitalist consulted for consideration for admission. ? ? ?Assessment and Plan: ?No notes have been filed under this hospital service. ?Service: Hospitalist ?S/P BKA (below knee amputation) ([place]) ?Chronic stable ?  ?Hypothyroid ?Restart home medications when able to tolerate p.o. ?For now, have ordered IV equivalent  ?  ?Sepsis ([place]) with uti present on admit ? -Presented febrile with tachycardia with UA suggestive of UTI ?-F/u on urine culture ?-Pt is continued on empiric rocephin ?-No leukocytosis ?  ?Acute metabolic encephalopathy ?-Suspect related to presenting UTI with dehydration and hypernatremia, see below ?-Cont IVF as tolerated and follow serial BMET trends ?-Remains difficult to awake throughout today despite IVF ?-Discussed case with Neurology who agrees with further w/u with EEG on arrival to [facility] ?-Already seen by Tele-neuro with recs for IV briviact '100mg'$  BID, clonazepam '1mg'$  BID PRN, lamotrignine '350mg'$  BID, and vimpat '200mg'$  bid ?-Appreciate Neurology assistance ?  ?DVT, bilateral lower limbs ([place]) ?-Presenting with supertherapeutic INR of over 7.  Given a dose of vitamin K  at time of admit ?-Cont to follow INR trends ?-Would resume coumadin when more medically stable,  consider lovenox or heparin gtt once INR has normalized in the meantime ?  ?Lupus anticoagulant disorder ([place]) ?-On chronic Coumadin.   ?-On hold secondary to supratherapeutic INR ?  ?Cerebrovascular accident, late effects ?-There is no left-sided side weakness.  Chronic  ?  ?Personal history of osteosarcoma ?-Of right lower extremity status post BKA ?  ?Supratherapeutic INR ?-Presenting INR of 7.6 ?-coumadin now on hold. Pt was given one dose of vit K at time of admission ?-INR this AM down to 4.3 ?-Consider bridge with lovenox or heparin gtt when INR normalizes and resuming coumadin once more medically stable ? ?Hypernatremia ?-Have transitioned fluids to D5W based on calculated free water deficit ?-sodium has normalized ?-repeat bmet in AM ?  ?Dehydration ?-Pt reportedly had decreased p.o. intake for the past 1 week.   ?-Now continued on IVF hydration ?  ?UTI (urinary tract infection) ?-UA suggestive of UTI ?-Blood and urine cx pending ?-Currently on empiric unasyn ?  ?Elevated CK ?Will continue to rehydrate ?  ?AKI (acute kidney injury) ([place]) ?TheIn the setting of severely dehydration ?Obtain urine electrolytes ?Follow renal status ?  ?Depression ?At this point unable to tolerate p.o. medications.   ?-Would resume home meds when more stable ?  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  ?Aspiration pneumonia ([place]) ?-Ct abdomen showing possible aspiration pna ?-Cont on Unasyn ?-Keep NPO for now ?-Aspiration precautions ?   ?Constipation ?-Would order bowel regimen once more awake and medically stable ? ? ?  ? ?Subjective: Unable to assess given mentation ? ?Physical Exam: ?Vitals:  ? 06/10/21 1645  06/10/21 1700 06/10/21 1730 06/10/21 1817  ?BP: 92/70 (!) 91/59 92/76 (!) 155/117  ?Pulse: 83 81 81 (!) 41  ?Resp: '13 15 16 16  '$ ?Temp:    97.9 ?F (36.6 ?C)  ?TempSrc:    Axillary  ?SpO2: 100% 100% 100% 93%  ? ?General exam: Awake, laying in  bed, in nad ?Respiratory system: Normal respiratory effort, no wheezing ?Cardiovascular system: regular rate, s1, s2 ?Gastrointestinal system: Soft, nondistended, positive BS ?Central nervous system: CN2-12 grossly intact, strength intact ?Extremities: Perfused, no clubbing ?Skin: Normal skin turgor, no notable skin lesions seen ?Psychiatry: Mood normal // no visual hallucinations  ? ?Data Reviewed: ? ?Labs reviewed: Na 143, Cr 1.04, WBC 9.0 ? ?Family Communication: Pt in room, pt's mother over phone and pt's son at bedside ? ?Disposition: ?Status is: Observation ?The patient will require care spanning > 2 midnights and should be moved to inpatient because: Severity of illness ? Planned Discharge Destination: Home ? ? ? ?Author: ?Marylu Lund, MD ?06/10/2021 6:40 PM ? ?For on call review [email or web address].  ?

## 2021-06-10 NOTE — Progress Notes (Signed)
OT Cancellation Note ? ?Patient Details ?Name: Cassandra Andrade ?MRN: [id number] ?DOB: [birth year] ? ? ?Cancelled Treatment:    Reason Eval/Treat Not Completed: Medical issues which prohibited therapy ?Patient is lethargic with pending transfer to [facility] at this time. OT to continue to follow and check back when patient is more alert.  ? ?Keneisha Heckart OTR/L, MS ?Acute Rehabilitation Department ?Office# [phone] ?Pager# [phone] ? ? ?06/10/2021, 12:28 PM ?

## 2021-06-10 NOTE — Final Progress Note (Signed)
PT Cancellation Note ? ?Patient Details ?Name: Cassandra Andrade ?MRN: [id number] ?DOB: April 20, 1965 ? ? ?Cancelled Treatment:      ?Patient is lethargic with pending transfer to [facility] at this time from [facility] ED . PT to continue to follow and check back when patient is more alert. ? ? Clide Dales ?06/10/2021, 1:09 PM ?Gatha Mayer, PT, MPT ?Acute Rehabilitation Services ?Office: [phone] ?Pager: [phone] ?06/10/2021 ? ?

## 2021-06-11 ENCOUNTER — Inpatient Hospital Stay (HOSPITAL_COMMUNITY): Payer: Medicare Other

## 2021-06-11 DIAGNOSIS — K5900 Constipation, unspecified: Secondary | ICD-10-CM

## 2021-06-11 DIAGNOSIS — Z789 Other specified health status: Secondary | ICD-10-CM

## 2021-06-11 DIAGNOSIS — N179 Acute kidney failure, unspecified: Secondary | ICD-10-CM | POA: Diagnosis not present

## 2021-06-11 DIAGNOSIS — I6990 Unspecified sequelae of unspecified cerebrovascular disease: Secondary | ICD-10-CM | POA: Diagnosis not present

## 2021-06-11 DIAGNOSIS — J690 Pneumonitis due to inhalation of food and vomit: Secondary | ICD-10-CM | POA: Diagnosis not present

## 2021-06-11 DIAGNOSIS — Z515 Encounter for palliative care: Secondary | ICD-10-CM

## 2021-06-11 DIAGNOSIS — E43 Unspecified severe protein-calorie malnutrition: Secondary | ICD-10-CM | POA: Insufficient documentation

## 2021-06-11 DIAGNOSIS — R4182 Altered mental status, unspecified: Secondary | ICD-10-CM | POA: Diagnosis not present

## 2021-06-11 DIAGNOSIS — R569 Unspecified convulsions: Secondary | ICD-10-CM

## 2021-06-11 DIAGNOSIS — G9341 Metabolic encephalopathy: Secondary | ICD-10-CM | POA: Diagnosis not present

## 2021-06-11 LAB — BLOOD CULTURE ID PANEL (REFLEXED) - BCID2

## 2021-06-11 LAB — LEGIONELLA PNEUMOPHILA SEROGP 1 UR AG: L. pneumophila Serogp 1 Ur Ag: NEGATIVE

## 2021-06-11 LAB — URINE CULTURE

## 2021-06-11 LAB — CBC
HCT: 32.4 % — ABNORMAL LOW (ref 36.0–46.0)
Hemoglobin: 9.9 g/dL — ABNORMAL LOW (ref 12.0–15.0)
MCH: 28.1 pg (ref 26.0–34.0)
MCHC: 30.6 g/dL (ref 30.0–36.0)
MCV: 92 fL (ref 80.0–100.0)
Platelets: 199 10*3/uL (ref 150–400)
RBC: 3.52 MIL/uL — ABNORMAL LOW (ref 3.87–5.11)
RDW: 13.5 % (ref 11.5–15.5)
WBC: 7.4 10*3/uL (ref 4.0–10.5)
nRBC: 0 % (ref 0.0–0.2)

## 2021-06-11 LAB — BASIC METABOLIC PANEL
Anion gap: 7 (ref 5–15)
BUN: 12 mg/dL (ref 6–20)
CO2: 26 mmol/L (ref 22–32)
Calcium: 8.3 mg/dL — ABNORMAL LOW (ref 8.9–10.3)
Chloride: 112 mmol/L — ABNORMAL HIGH (ref 98–111)
Creatinine, Ser: 1.05 mg/dL — ABNORMAL HIGH (ref 0.44–1.00)
GFR, Estimated: >60 mL/min (ref >60)
Glucose, Bld: 122 mg/dL — ABNORMAL HIGH (ref 70–99)
Potassium: 3.3 mmol/L — ABNORMAL LOW (ref 3.5–5.1)
Sodium: 145 mmol/L (ref 135–145)

## 2021-06-11 LAB — PROTIME-INR
INR: 3.1 — ABNORMAL HIGH (ref 0.8–1.2)
Prothrombin Time: 31.7 seconds — ABNORMAL HIGH (ref 11.4–15.2)

## 2021-06-11 LAB — CK: Total CK: 1454 U/L — ABNORMAL HIGH (ref 38–234)

## 2021-06-11 MED ORDER — ADULT MULTIVITAMIN W/MINERALS CH
1.0000 | ORAL_TABLET | Freq: Every day | ORAL | Status: DC
  Administered 2021-06-12 – 2021-06-16 (×5): 1 via ORAL
  Filled 2021-06-11 (×7): qty 1

## 2021-06-11 MED ORDER — POTASSIUM CHLORIDE 10 MEQ/100ML IV SOLN
10.0000 meq | INTRAVENOUS | Status: AC
  Administered 2021-06-11 (×4): 10 meq via INTRAVENOUS
  Filled 2021-06-11 (×4): qty 100

## 2021-06-11 MED ORDER — ORAL CARE MOUTH RINSE
15.0000 mL | Freq: Two times a day (BID) | OROMUCOSAL | Status: DC
  Administered 2021-06-11 – 2021-06-16 (×11): 15 mL via OROMUCOSAL

## 2021-06-11 MED ORDER — ENSURE ENLIVE PO LIQD
237.0000 mL | Freq: Two times a day (BID) | ORAL | Status: DC
  Administered 2021-06-12 – 2021-06-17 (×8): 237 mL via ORAL

## 2021-06-11 MED ORDER — LORAZEPAM 2 MG/ML IJ SOLN
1.0000 mg | Freq: Four times a day (QID) | INTRAMUSCULAR | Status: DC
  Administered 2021-06-11: 1 mg via INTRAVENOUS
  Filled 2021-06-11: qty 1

## 2021-06-11 NOTE — Evaluation (Signed)
Occupational Therapy Evaluation ?Patient Details ?Name: Cassandra Andrade ?MRN: [id number] ?DOB: 02/22/1966 ?Today's Date: 06/11/2021 ? ? ?History of Present Illness Pt is a 56 y.o. female admitted 4/18 with acute metabolic encephalopathy. 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 ?Clinical Impression ?  ?Pt admitted for concerns listed above. PTA pt's family reported that she was supervision/mod I level a little over a month ago, however quickly went down hill, requiring total assist at home. At this time, pt continuing to require total assist. She is following only approximately 10% of commands, with increased resistance/tone in BUE. Vision unable to be assessed as pt kept her eyes closed throughout the session. She did attempt to respond to some questions, however all speech was garbled and difficult to understand. Recommending [facility] as family wishes to take pt home once medically stable. OT will follow acutely.   ?   ? ?Recommendations for follow up therapy are one component of a multi-disciplinary discharge planning process, led by the attending physician.  Recommendations may be updated based on patient status, additional functional criteria and insurance authorization.  ? ?Follow Up Recommendations ? Home health OT  ?  ?Assistance Recommended at Discharge Frequent or constant Supervision/Assistance  ?Patient can return home with the following Two people to help with walking and/or transfers;Two people to help with bathing/dressing/bathroom;Assistance with cooking/housework;Assistance with feeding;Direct supervision/assist for medications management;Direct supervision/assist for financial management;Assist for transportation;Help with stairs or ramp for entrance ? ?  ?Functional Status Assessment ? Patient has had a recent decline in their functional status and/or demonstrates limited ability to make significant improvements in function in a  reasonable and predictable amount of time  ?Equipment Recommendations ?  (hoyer lift?)  ?  ?Recommendations for Other Services   ? ? ?  ?Precautions / Restrictions Precautions ?Precautions: Fall ?Precaution Comments: Per family recent h/o aggressive behavior (i.e. hitting, punching) ?Restrictions ?Weight Bearing Restrictions: No  ? ?  ? ?Mobility Bed Mobility ?Overal bed mobility: Needs Assistance ?Bed Mobility: Rolling ?Rolling: Total assist ?  ?  ?  ?  ?General bed mobility comments: use of bed pad to roll R/L ?  ? ?Transfers ?  ?  ?  ?  ?  ?  ?  ?  ?  ?  ?  ? ?  ?Balance   ?  ?  ?  ?  ?  ?  ?  ?  ?  ?  ?  ?  ?  ?  ?  ?  ?  ?  ?   ? ?ADL either performed or assessed with clinical judgement  ? ?ADL Overall ADL's : Needs assistance/impaired ?  ?  ?  ?  ?  ?  ?  ?  ?  ?  ?  ?  ?  ?  ?  ?  ?  ?  ?  ?General ADL Comments: Total A +1-2 for all ADL's, due to difficulty following commands, lethargy, and weakness.  ? ? ? ?Vision   ?Additional Comments: Pt did not open hereyes for whole session  ?   ?Perception   ?  ?Praxis   ?  ? ?Pertinent Vitals/Pain Pain Assessment ?Pain Assessment: PAINAD ?Breathing: normal ?Negative Vocalization: occasional moan/groan, low speech, negative/disapproving quality ?Facial Expression: smiling or inexpressive ?Body Language: relaxed ?Consolability: no need to console ?PAINAD Score: 1 ?Pain Location: LLE with ROM ?Pain Descriptors / Indicators: Grimacing, Guarding, Moaning, Discomfort ?Pain Intervention(s): Monitored during  session, Repositioned  ? ? ? ?Hand Dominance Right ?  ?Extremity/Trunk Assessment Upper Extremity Assessment ?Upper Extremity Assessment: RUE deficits/detail;LUE deficits/detail ?RUE Deficits / Details: tense, at times felt resistive when OT providing PROM, no active muscle tonewhen OT asked pt tomove arm/fingers ?RUE Coordination: decreased fine motor;decreased gross motor ?LUE Deficits / Details: Less tone/resisting on LUE, OT able to PROM elbow and wrist, shoulder  reaches a hard stop around 40degrees ?LUE Coordination: decreased fine motor;decreased gross motor ?  ?Lower Extremity Assessment ?Lower Extremity Assessment: Defer to PT evaluation ?  ?  ?  ?Communication Communication ?Communication: Receptive difficulties;Expressive difficulties ?  ?Cognition Arousal/Alertness: Lethargic ?Behavior During Therapy: Flat affect ?Overall Cognitive Status: Difficult to assess ?  ?  ?  ?  ?  ?  ?  ?  ?  ?  ?  ?  ?  ?  ?  ?  ?General Comments: Garbled, mumbling speech. Followed approximately 10% of commands ?  ?  ?General Comments  VSS on RA ? ?  ?Exercises   ?  ?Shoulder Instructions    ? ? ?Home Living Family/patient expects to be discharged to:: Private residence ?Living Arrangements: Parent ?Available Help at Discharge: Family;Available 24 hours/day ?Type of Home: House ?Home Access: Stairs to enter ?Entrance Stairs-Number of Steps: 4 ?  ?Home Layout: Two level;Bed/bath upstairs ?Alternate Level Stairs-Number of Steps: flight ?  ?Bathroom Shower/Tub: Walk-in shower ?  ?Bathroom Toilet: Standard ?  ?  ?Home Equipment: Conservation officer, nature (2 wheels);Shower seat - built in;Wheelchair - manual;Hospital bed ?  ?  ?  ? ?  ?Prior Functioning/Environment Prior Level of Function : Needs assist ?  ?  ?  ?  ?  ?  ?Mobility Comments: Recent decline over last couple of months resulting in pt being primarily bedridden, total assist for last 2-3 weeks. Prior to decline, pt mobilized mod I by scooting around the house, including scooting up/down the stairs. Wheelchair used in community. ?ADLs Comments: Prior to decline, pt mod I/set up/S for ADLs. Total assist, incontinent for last 2-3 weeks. ?  ? ?  ?  ?OT Problem List: Decreased strength;Decreased range of motion;Impaired balance (sitting and/or standing);Decreased activity tolerance;Impaired vision/perception;Decreased coordination;Decreased cognition;Decreased safety awareness;Decreased knowledge of use of DME or AE;Impaired sensation;Impaired  tone;Impaired UE functional use;Pain ?  ?   ?OT Treatment/Interventions: Self-care/ADL training;Therapeutic exercise;Neuromuscular education;Energy conservation;DME and/or AE instruction;Therapeutic activities;Cognitive remediation/compensation;Visual/perceptual remediation/compensation;Patient/family education  ?  ?OT Goals(Current goals can be found in the care plan section) Acute Rehab OT Goals ?Patient Stated Goal: none stated ?OT Goal Formulation: Patient unable to participate in goal setting ?Time For Goal Achievement: 06/24/21 ?Potential to Achieve Goals: Fair ?ADL Goals ?Additional ADL Goal #1: Pt will complete all aspects of bed mobility with mod A as a precursor to seated ADL's. ?Additional ADL Goal #2: Pt will follow 1 step commands for 50% of session. ?Additional ADL Goal #3: Pt will attend to 1 task for 3 mins with min multimodal cuing.  ?OT Frequency: Min 2X/week ?  ? ?Co-evaluation   ?  ?  ?  ?  ? ?  ?AM-PAC OT "6 Clicks" Daily Activity     ?Outcome Measure Help from another person eating meals?: Total ?Help from another person taking care of personal grooming?: Total ?Help from another person toileting, which includes using toliet, bedpan, or urinal?: Total ?Help from another person bathing (including washing, rinsing, drying)?: Total ?Help from another person to put on and taking off regular upper body clothing?: Total ?Help from another  person to put on and taking off regular lower body clothing?: Total ?6 Click Score: 6 ?  ?End of Session Nurse Communication: Mobility status ? ?Activity Tolerance: Patient tolerated treatment well ?Patient left: in bed;with call bell/phone within reach;with bed alarm set ? ?OT Visit Diagnosis: Unsteadiness on feet (R26.81);Other abnormalities of gait and mobility (R26.89);Muscle weakness (generalized) (M62.81)  ?              ?Time: 3225-6720 ?OT Time Calculation (min): 10 min ?Charges:  OT General Charges ?$OT Visit: 1 Visit ?OT Evaluation ?$OT Eval Moderate  Complexity: 1 Mod ? ?Lamarion Mcevers H., OTR/L ?Acute Rehabilitation ? ?Cassandra Andrade ?06/11/2021, 6:01 PM ?

## 2021-06-11 NOTE — Progress Notes (Signed)
LTM maint complete - no skin breakdown  ?Serviced leads, adjust hair netting ?[facility] monitored, Event button test confirmed by [facility]. ? ?

## 2021-06-11 NOTE — Progress Notes (Addendum)
?PROGRESS NOTE ? ? ? ?Cassandra Andrade  GUY:[id number] DOB: September 22, 1965 DOA: 06/09/2021 ?PCP: Berkley Harvey, NP  ? ? ?Brief Narrative:  ?Patient is a 56 y.o. 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  In the ED patient was noted to be hyponatremic with acute kidney injury and UTI.  Patient was also seen by telemetry neurology who made changes to seizure medication regimen.  Patient was then considered for admission to the hospital. ? ?Assessment and Plan: ?Principal Problem: ?  Acute metabolic encephalopathy ?Active Problems: ?  Sepsis ([place]) ?  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  UTI (urinary tract infection) ?  Hypothyroid ?  AKI (acute kidney injury) ([place]) ?  Depression ?  DVT, bilateral lower limbs ([place]) ?  Supratherapeutic INR ?  Elevated CK ?  Dehydration ?  Hypernatremia ?  S/P BKA (below knee amputation) ([place]) ?  Seizure ([place]) ?  Lupus anticoagulant disorder ([place]) ?  Cerebrovascular accident, late effects ?  Personal history of osteosarcoma ?  Aspiration pneumonia ([place]) ?  Constipation ?  Protein-calorie malnutrition, severe ?  ?Acute metabolic encephalopathy ?Likely secondary to UTI dehydration and hypernatremia.  Continue IV fluids.  Patient has been transferred to [facility] from [facility] EEG monitoring.  Telemetry neurology/in-house neurology has seen the patient and patient is currently on IV briviact '100mg'$  BID,  lamotrignine '350mg'$  BID, and vimpat '200mg'$  bid.  Will follow recommendations.  MRI of the brain has been ordered. ? ?Rule out seizures and status epilepticus.  On continuous EEG.  On brivaracetam, Vimpat.   Continue to hold venlafaxine and olanzapine.  Neurology following.  Follow MRI.  Neurology recommends adding 1 mg Ativan every 6 hours for possibility of neuroleptic malignant syndrome due to elevated CK and altered mental status. ? ?Sepsis ([place]) secondary to UTI. ?Patient had a fever with tachycardia and a UA showed  UTI.  On empiric IV Unasyn.  Follow urine cultures.  Blood cultures showing gram-positive cocci in anaerobic bottles only, Staph epidermidis.  Likely contaminant..  We will continue to follow blood cultures. ?  ?S/P BKA (below knee amputation) ([place]) ?Chronic stable ?  ?Hypothyroidism ?On IV Synthroid at this time.  Resume oral when mental status improves. ?   ?DVT, bilateral lower limbs ([place]) ?-Presented with supratherapeutic INR.  On Coumadin as outpatient..  Pharmacy to manage.  ?  ?Lupus anticoagulant disorder ([place]) ?On Coumadin as outpatient.  Currently on hold due to supratherapeutic INR.  Pharmacy to manage. ?  ?Personal history of osteosarcoma ?Status post right BKA. ?  ?Supratherapeutic INR ?Patient returned with INR of 7.6.  Coumadin on hold.  Received 1 dose of vitamin K.  Pharmacy to manage ?  ?Hypernatremia ?On D5 water.  Sodium level has improved to 145 this morning.  Continue to monitor. ? ?Mild hypokalemia.  We will replace through IV. ? ?Dehydration ?Patient with decreased oral intake at home. ?  ?Elevated CK, likely mild rhabdomyolysis. ?Will continue to rehydrate.  Check CK levels in a.m.  CK level today at 1454. ?  ?AKI (acute kidney injury) ([place]) ?Likely secondary to volume depletion.  Creatinine today at 1.0, has trended down from 1.9 ? ?  Depression ?On Effexor, Zyprexa at home.  Overall limited at this time.  Resume when appropriate. ?  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  Currently on IV AED. ?  ?Aspiration pneumonia ([place]) ?Likely secondary to metabolic encephalopathy.  On Unasyn.  Currently NPO.  Check a speech and swallow evaluation and oral when appropriate ?    ?Constipation ?-Would order bowel regimen once more awake and medically stable ? ?Severe protein calorie malnutrition. ?Present on admission.  Seen by dietitian. ? ?Goals of care.  Palliative care has been consulted.  At this time plan is to continue full code and scope of treatment ?   ? ? DVT prophylaxis: SCDs Start: 06/10/21 0036 ? ? ?Code Status:   ?  Code Status: Full Code ? ?Disposition: Home ? ?Status is: Inpatient ? ?Remains inpatient appropriate because: Need for continuous EEG, n.p.o. status, metabolic encephalopathy ? ? Family Communication: ? None at bedside ? ?Consultants:  ?Neurology ?Palliative care ? ?Procedures:  ?Continuous EEG ? ?Antimicrobials:  ?Unasyn 4/19> ? ?Anti-infectives (From admission, onward)  ? ? Start     Dose/Rate Route Frequency Ordered Stop  ? 06/10/21 2300  cefTRIAXone (ROCEPHIN) 2 g in sodium chloride 0.9 % 100 mL IVPB  Status:  Discontinued       ? 2 g ?200 mL/hr over 30 Minutes Intravenous Every 24 hours 06/09/21 2325 06/10/21 0123  ? 06/10/21 1600  Ampicillin-Sulbactam (UNASYN) 3 g in sodium chloride 0.9 % 100 mL IVPB       ? 3 g ?200 mL/hr over 30 Minutes Intravenous Every 6 hours 06/10/21 0953    ? 06/10/21 0200  Ampicillin-Sulbactam (UNASYN) 3 g in sodium chloride 0.9 % 100 mL IVPB  Status:  Discontinued       ? 3 g ?200 mL/hr over 30 Minutes Intravenous Every 8 hours 06/10/21 0129 06/10/21 0953  ? 06/09/21 2300  cefTRIAXone (ROCEPHIN) 1 g in sodium chloride 0.9 % 100 mL IVPB       ? 1 g ?200 mL/hr over 30 Minutes Intravenous  Once 06/09/21 2247 06/09/21 2329  ? ?  ? ? ? ?Subjective: ?Today, patient was seen and examined at bedside.  Patient is awake but confused and with indistinct speech. ? ?Objective: ?Vitals:  ? 06/11/21 0358 06/11/21 0733 06/11/21 1201 06/11/21 1521  ?BP: 127/60 137/69 (!) 162/77 116/68  ?Pulse: 87 87 95 93  ?Resp:  '18 16 14  '$ ?Temp: 97.9 ?F (36.6 ?C) 98.2 ?F (36.8 ?C) 98.5 ?F (36.9 ?C) 97.6 ?F (36.4 ?C)  ?TempSrc: Oral Oral Oral Oral  ?SpO2: 100% 94%   100%  ? ? ?Intake/Output Summary (Last 24 hours) at 06/11/2021 1655 ?Last data filed at 06/11/2021 1411 ?Gross per 24 hour  ?Intake 2146.26 ml  ?Output 1400 ml  ?Net 746.26 ml  ? ?There were no vitals filed for this visit. ? ?Physical Examination: ? ?General:  Average built, not in obvious distress, alert awake but confused, mumbled speech ?HENT:   No scleral pallor or icterus noted. Oral mucosa is moist.  ?Chest:  Clear breath sounds.  Diminished breath sounds bilaterally. No crackles or wheezes.  ?CVS: S1 &S2 heard. No murmur.  Regular rate and rhythm. ?Abdomen: Soft, nontender, nondistended.  Bowel sounds are heard.   ?Extremities: Right above-knee amputation. ?Psych: Alert, awake just with incomprehensible speech. ?CNS: Incomprehensible speech.  Follows simple commands.  Raises extremities ?Skin: Warm and dry.  No rashes noted. ? ?Data Reviewed:  ? ?CBC: ?Recent Labs  ?Lab 06/09/21 ?2216 06/10/21 ?8119 06/11/21 ?  0250  ?WBC 9.0 9.0 7.4  ?NEUTROABS 7.2  --   --   ?HGB 11.9* 11.0* 9.9*  ?HCT 38.5 36.0 32.4*  ?MCV 92.3 92.5 92.0  ?PLT 229 205 199  ? ? ?Basic Metabolic Panel: ?Recent Labs  ?Lab 06/09/21 ?2216 06/10/21 ?4034 06/10/21 ?1655 06/11/21 ?0250  ?NA 151* 150* 143 145  ?K 4.0 3.9 3.5 3.3*  ?CL 115* 119* 112* 112*  ?CO2 '25 25 24 26  '$ ?GLUCOSE 132* 127* 288* 122*  ?BUN 29* 24* 17 12  ?CREATININE 1.90* 1.40* 1.04* 1.05*  ?CALCIUM 9.1 8.1* 7.6* 8.3*  ?MG 2.2 2.0  --   --   ?PHOS 4.5 3.1  --   --   ? ? ?Liver Function Tests: ?Recent Labs  ?Lab 06/09/21 ?2216 06/10/21 ?7425  ?AST 33 36  ?ALT 17 17  ?ALKPHOS 82 74  ?BILITOT 0.6 0.4  ?PROT 7.7 6.7  ?ALBUMIN 3.6 3.1*  ? ? ? ?Radiology Studies: ?DG Chest 1 View ? ?Result Date: 06/09/2021 ?CLINICAL DATA:  Altered mental status. EXAM: CHEST  1 VIEW COMPARISON:  Chest radiograph dated 02/14/2021. FINDINGS: Shallow inspiration. No focal consolidation, pleural effusion, pneumothorax. The cardiac silhouette is within limits. No acute osseous pathology. Simulator device over the  left chest. IMPRESSION: No active cardiopulmonary disease. Electronically Signed   By: Anner Crete M.D.   On: 06/09/2021 22:43  ? ?CT Head Wo Contrast ? ?Result Date: 06/09/2021 ?CLINICAL DATA:  Altered men

## 2021-06-11 NOTE — Progress Notes (Signed)
EEG complete - results pending 

## 2021-06-11 NOTE — Progress Notes (Signed)
Neurology Progress Note ? ? ?S:// ?Seen and examined this morning ?Appears confused with incomprehensible speech ? ? ?O:// ?Current vital signs: ?BP 137/69 (BP Location: Left Leg)   Pulse 87   Temp 98.2 ?F (36.8 ?C) (Oral)   Resp 18   SpO2 94%  ?Vital signs in last 24 hours: ?Temp:  [97.9 ?F (36.6 ?C)-98.2 ?F (36.8 ?C)] 98.2 ?F (36.8 ?C) (04/20 3664) ?Pulse Rate:  [41-95] 87 (04/20 0733) ?Resp:  [13-27] 18 (04/20 0733) ?BP: (91-155)/(59-117) 137/69 (04/20 4034) ?SpO2:  [93 %-100 %] 94 % (04/20 0733) ?General: Appears awake alert and mumbling incomprehensible words ?HEENT: Normocephalic atraumatic ?Lungs: Clear ?Cardiovascular: Regular rhythm ?Abdomen nondistended nontender ?Neurological exam ?Awake, questionably follows simple commands consistently but gets very agitated. ?Speech is completely incomprehensible and garbled ?Cranial nerves: Pupils are equal round reactive to light, she is able to move her eyes to both directions spontaneously with mildly disconjugate gaze.  Face is symmetric. ?Motor examination: Raises both upper extremities antigravity.  Withdraws both lower extremities to noxious simulation ?Coordination difficult to assess given her mentation ? ?Medications ? ?Current Facility-Administered Medications:  ?  acetaminophen (TYLENOL) tablet 650 mg, 650 mg, Oral, Q6H PRN **OR** acetaminophen (TYLENOL) suppository 650 mg, 650 mg, Rectal, Q6H PRN, Doutova, Anastassia, MD ?  Ampicillin-Sulbactam (UNASYN) 3 g in sodium chloride 0.9 % 100 mL IVPB, 3 g, Intravenous, Q6H, Adrian Saran, RPH, Stopped at 06/11/21 0408 ?  bisacodyl (DULCOLAX) suppository 10 mg, 10 mg, Rectal, Daily PRN, Doutova, Anastassia, MD ?  brivaracetam (BRIVIACT) 100 mg in sodium chloride 0.9 % 50 mL, 100 mg, Intravenous, Q12H, Plancher, Baron Sane, MD, Stopped at 06/11/21 0435 ?  dextrose 5 % solution, , Intravenous, Continuous, Donne Hazel, MD, Last Rate: 75 mL/hr at 06/11/21 0606, Infusion Verify at 06/11/21 0606 ?  lacosamide  (VIMPAT) 200 mg in sodium chloride 0.9 % 25 mL IVPB, 200 mg, Intravenous, Q12H, Plancher, Baron Sane, MD, Stopped at 06/11/21 0253 ?  lamoTRIgine (LAMICTAL) tablet 350 mg, 350 mg, Oral, BID, Plancher, Baron Sane, MD ?  Derrill Memo ON 06/17/2021] levothyroxine (SYNTHROID, LEVOTHROID) injection 25 mcg, 25 mcg, Intravenous, Daily, Donne Hazel, MD ?  thiamine (B-1) injection 100 mg, 100 mg, Intravenous, Daily, Doutova, Anastassia, MD, 100 mg at 06/10/21 0202 ?Labs ?CBC ?   ?Component Value Date/Time  ? WBC 7.4 06/11/2021 0250  ? RBC 3.52 (L) 06/11/2021 0250  ? HGB 9.9 (L) 06/11/2021 0250  ? HGB 12.3 11/12/2020 1210  ? HGB 11.5 (L) 01/12/2017 1125  ? HCT 32.4 (L) 06/11/2021 0250  ? HCT 39.1 11/12/2020 1210  ? HCT 36.9 01/12/2017 1125  ? PLT 199 06/11/2021 0250  ? PLT 230 11/12/2020 1210  ? MCV 92.0 06/11/2021 0250  ? MCV 91 11/12/2020 1210  ? MCV 90.2 01/12/2017 1125  ? MCH 28.1 06/11/2021 0250  ? MCHC 30.6 06/11/2021 0250  ? RDW 13.5 06/11/2021 0250  ? RDW 12.7 11/12/2020 1210  ? RDW 14.1 01/12/2017 1125  ? LYMPHSABS 1.2 06/09/2021 2216  ? LYMPHSABS 2.0 11/12/2020 1210  ? LYMPHSABS 2.1 01/12/2017 1125  ? MONOABS 0.6 06/09/2021 2216  ? MONOABS 0.4 01/12/2017 1125  ? EOSABS 0.0 06/09/2021 2216  ? EOSABS 0.1 11/12/2020 1210  ? EOSABS 0.1 09/19/2013 1311  ? BASOSABS 0.0 06/09/2021 2216  ? BASOSABS 0.0 11/12/2020 1210  ? BASOSABS 0.0 01/12/2017 1125  ? ? ?CMP  ?   ?Component Value Date/Time  ? NA 145 06/11/2021 0250  ? NA 143 11/12/2020 1210  ? NA  141 01/12/2017 1125  ? K 3.3 (L) 06/11/2021 0250  ? K 4.5 01/12/2017 1125  ? CL 112 (H) 06/11/2021 0250  ? CL 97 (L) 07/11/2013 1346  ? CO2 26 06/11/2021 0250  ? CO2 30 (H) 01/12/2017 1125  ? GLUCOSE 122 (H) 06/11/2021 0250  ? GLUCOSE 79 01/12/2017 1125  ? GLUCOSE 126 (H) 07/11/2013 1346  ? BUN 12 06/11/2021 0250  ? BUN 20 11/12/2020 1210  ? BUN 11.2 01/12/2017 1125  ? CREATININE 1.05 (H) 06/11/2021 0250  ? CREATININE 0.98 01/11/2018 1116  ? CREATININE 0.9 01/12/2017 1125  ? CALCIUM 8.3 (L)  06/11/2021 0250  ? CALCIUM 10.1 01/12/2017 1125  ? PROT 6.7 06/10/2021 0514  ? PROT 7.0 11/12/2020 1210  ? PROT 7.8 01/12/2017 1125  ? ALBUMIN 3.1 (L) 06/10/2021 0514  ? ALBUMIN 4.5 11/12/2020 1210  ? ALBUMIN 4.0 01/12/2017 1125  ? AST 36 06/10/2021 0514  ? AST 23 01/11/2018 1116  ? AST 34 01/12/2017 1125  ? ALT 17 06/10/2021 0514  ? ALT 22 01/11/2018 1116  ? ALT 65 (H) 01/12/2017 1125  ? ALKPHOS 74 06/10/2021 0514  ? ALKPHOS 105 01/12/2017 1125  ? BILITOT 0.4 06/10/2021 0514  ? BILITOT <0.2 11/12/2020 1210  ? BILITOT 0.2 (L) 01/11/2018 1116  ? BILITOT 0.23 01/12/2017 1125  ? GFRNONAA >60 06/11/2021 0250  ? GFRNONAA >60 01/11/2018 1116  ? GFRAA 69 03/24/2020 1324  ? GFRAA >60 01/11/2018 1116  ? ? ?Imaging ?I have reviewed images in epic and the results pertinent to this consultation are: ?CT head negative ? ?Assessment: 56 year old past history of hemorrhagic stroke and seizures presented with altered mental status worsening on the hospital.  Continues to be encephalopathic on exam.  Concern for underlying seizures/status of Depakote. ?Hooked up to rapid EEG overnight-no definitive seizures captured on it per preliminary review from the overnight neurologist. ?Plan  to LTM, and obtain MRI. ?Given increased CK on labs and altered mental status-NMS have to be considered as differential as well. ? 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 ?Recommendations: ?Agree with continuing to hold venlafaxine and olanzapine which were home medications. ?Routine EEG followed by LTM EEG ?Continue brivaracetam 100 twice daily IV ?Continue Vimpat 200 mg twice daily IV ?MRI brain when able to - will have MRI safe leads for EEG placed. Techs aware. ?Hydration per primary team for the hyperCKemia and possible NMS. May consider adding low dose benzodiazepine-Ativan 1 mg every 6 hours as treatment for NMS. ?Will follow ?Plan relayed to Dr. Louanne Belton ? ?-- ?Amie Portland,  MD ?Neurologist ?[facility] ?Pager: [phone] ? ? ? ?

## 2021-06-11 NOTE — Progress Notes (Signed)
EEG LTM connected. [facility] notified. MRI LEADS are on.  ?

## 2021-06-11 NOTE — Progress Notes (Addendum)
Initial Nutrition Assessment ? ?DOCUMENTATION CODES:  ?Severe malnutrition in context of acute illness/injury ? ?INTERVENTION:  ?-transition to inappropriate for room service ?-Ensure Enlive po BID, each supplement provides 350 kcal and 20 grams of protein. ?-Mighty Shake po TID, each supplement provides 330 kcal and 9 grams of protein ?-MVI with minerals daily ?-recommend initiation of bowel regimen  ? ?NUTRITION DIAGNOSIS:  ?Severe Malnutrition related to acute illness (seizures/status epilepticus vs NMS) as evidenced by moderate muscle depletion, moderate fat depletion, energy intake < or equal to 50% for > or equal to 5 days. ? ?GOAL:  ?Patient will meet greater than or equal to 90% of their needs ? ?MONITOR:  ?PO intake, Supplement acceptance, Labs ? ?REASON FOR ASSESSMENT:  ?Consult ?Assessment of nutrition requirement/status ? 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 ? ?Neurology consulted -- noted pt being evaluated for seizures/status epilepticus and concern for NMS. MRI pending and pt undergoing EEG.  ? ?Pt unable to provide any history. Mother at bedside to assist. Pt's mother reports appetite was once good, but has been slowly declining for some time and over the last several days has dwindled to nothing more than a few sips of Boost at home. Pt's mother also reports a lack of BM. Noted MD plan to initiate bowel regimen. States pt previously tolerating Boost/Ensure supplements and ice cream and is agreeable to trial of Ensure and Mighty Shake to help meet nutrition needs.  ? ?Weight history reviewed. No significant weight changes noted. ? ?UOP: 958m x24 hours ?I/O: +34986msince admit ? ?Medications: thiamine, IV abx, IV KCl 1055mQ1H x 4 ?IVF: D5 @ 43m45m  ? ?Labs: ?Recent  Labs  ?Lab 06/09/21 ?2216 06/10/21 ?0514675919/23 ?1655 06/11/21 ?0250  ?NA 151* 150* 143 145  ?K 4.0 3.9 3.5 3.3*  ?CL 115* 119* 112* 112*  ?CO2 '25 25 24 26  '$ ?BUN 29* 24* 17 12  ?CREATININE 1.90* 1.40* 1.04* 1.05*  ?CALCIUM 9.1 8.1* 7.6* 8.3*  ?MG 2.2 2.0  --   --   ?PHOS 4.5 3.1  --   --   ?GLUCOSE 132* 127* 288* 122*  ?Hgb 9.9 ? ?NUTRITION - FOCUSED PHYSICAL EXAM: ?Flowsheet Row Most Recent Value  ?Orbital Region Moderate depletion  ?Upper Arm Region Moderate depletion  ?Thoracic and Lumbar Region Mild depletion  ?Buccal Region Moderate depletion  ?Temple Region Moderate depletion  ?Clavicle Bone Region Mild depletion  ?Clavicle and Acromion Bone Region Mild depletion  ?Scapular Bone Region Unable to assess  ?Dorsal Hand Unable to assess  ?Patellar Region Moderate depletion  ?Anterior Thigh Region Severe depletion  ?Posterior Calf Region Severe depletion  ?Edema (RD Assessment) None  ?Hair Unable to assess  ?Eyes Reviewed  ?Mouth Other (Comment)  [edentulous]  ?Skin Reviewed  ?Nails Reviewed  ? ?Diet Order:   ?Diet Order   ? ?       ?  DIET - DYS 1 Room service appropriate? No; Fluid consistency: Thin  Diet effective now       ?  ? ?  ?  ? ?  ? ?EDUCATION NEEDS:  ?Not appropriate for education at this time ? ?Skin:  Skin Assessment: Reviewed RN Assessment ? ?Last BM:  PTA ? ?Height:  ?Ht Readings from Last 1 Encounters:  ?02/22/21 '5\' 6"'$  (1.676 m)  ? ?  Weight:  ?Wt Readings from Last 1 Encounters:  ?02/22/21 78.5 kg  ? ?BMI:  There is no height or weight on file to calculate BMI. ? ?Estimated Nutritional Needs:  ?Kcal:  1950-2150 ?Protein:  90-105 grams ?Fluid:  >1.9L ? ? ? ?Theone Stanley., MS, RD, LDN (she/her/hers) ?RD pager number and weekend/on-call pager number located in [place]. ?

## 2021-06-11 NOTE — Evaluation (Signed)
Physical Therapy Evaluation ?Patient Details ?Name: Cassandra Andrade ?MRN: [id number] ?DOB: 14-Apr-1965 ?Today's Date: 06/11/2021 ? ?History of Present Illness ? Pt is a 56 y.o. female admitted 4/18 with acute metabolic encephalopathy. 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  ?Clinical Impression ? Pt admitted with above diagnosis. PTA pt lived at home with her mother. Pt currently with functional limitations due to the deficits listed below (see PT Problem List). On eval pt required total assist rolling R/L. Garbled, mumbling speech. Lethargic. Not following commands. Undergoing LTM EEG at time of eval. Pt will benefit from skilled PT to increase their independence and safety with mobility to allow discharge to the venue listed below.  Mother present in room and declining SNF. Reports she has been pt's caretaker at home for a number of years. Pt would greatly benefit from HHaide/PCA. ?   ?   ? ?Recommendations for follow up therapy are one component of a multi-disciplinary discharge planning process, led by the attending physician.  Recommendations may be updated based on patient status, additional functional criteria and insurance authorization. ? ?Follow Up Recommendations Home health PT (PCA/aide) ? ?  ?Assistance Recommended at Discharge Frequent or constant Supervision/Assistance  ?Patient can return home with the following ? A lot of help with bathing/dressing/bathroom;Direct supervision/assist for medications management;Assist for transportation;Help with stairs or ramp for entrance;A lot of help with walking and/or transfers;Assistance with feeding ? ?  ?Equipment Recommendations Other (comment) (hoyer lift)  ?Recommendations for Other Services ?    ?  ?Functional Status Assessment Patient has had a recent decline in their functional status and/or demonstrates limited ability to make significant improvements in function in a reasonable and  predictable amount of time  ? ?  ?Precautions / Restrictions Precautions ?Precautions: Fall ?Precaution Comments: Per family recent h/o aggressive behavior (i.e. hitting, punching)  ? ?  ? ?Mobility ? Bed Mobility ?Overal bed mobility: Needs Assistance ?Bed Mobility: Rolling ?Rolling: Total assist ?  ?  ?  ?  ?General bed mobility comments: use of bed pad to roll R/L ?  ? ?Transfers ?  ?  ?  ?  ?  ?  ?  ?  ?  ?General transfer comment: deferred. Pt undergoing LTM EEG ?  ? ?Ambulation/Gait ?  ?  ?  ?  ?  ?  ?  ?  ? ?Stairs ?  ?  ?  ?  ?  ? ?Wheelchair Mobility ?  ? ?Modified Rankin (Stroke Patients Only) ?  ? ?  ? ?Balance   ?  ?  ?  ?  ?  ?  ?  ?  ?  ?  ?  ?  ?  ?  ?  ?  ?  ?  ?   ? ? ? ?Pertinent Vitals/Pain Pain Assessment ?Pain Assessment: Faces ?Faces Pain Scale: Hurts little more ?Pain Location: LLE with ROM ?Pain Descriptors / Indicators: Grimacing, Guarding, Moaning, Discomfort ?Pain Intervention(s): Limited activity within patient's tolerance  ? ? ?Home Living Family/patient expects to be discharged to:: Private residence ?Living Arrangements: Parent ?Available Help at Discharge: Family;Available 24 hours/day ?Type of Home: House ?Home Access: Stairs to enter ?  ?Entrance Stairs-Number of Steps: 4 ?Alternate Level Stairs-Number of Steps: flight ?Home Layout: Two level;Bed/bath upstairs ?Home Equipment: Conservation officer, nature (2 wheels);Shower seat - built in;Wheelchair - manual;Hospital bed ?   ?  ?Prior Function Prior Level of Function : Needs assist ?  ?  ?  ?  Physical Assist : ADLs (physical);Mobility (physical) ?  ?  ?Mobility Comments: Recent decline over last couple of months resulting in pt being primarily bedridden, total assist for last 2-3 weeks. Prior to decline, pt mobilized mod I by scooting around the house, including scooting up/down the stairs. Wheelchair used in community. ?ADLs Comments: Prior to decline, pt mod I/set up/S for ADLs. Total assist, incontinent for last 2-3 weeks. ?  ? ? ?Hand  Dominance  ? Dominant Hand: Right ? ?  ?Extremity/Trunk Assessment  ? Upper Extremity Assessment ?Upper Extremity Assessment: Defer to OT evaluation ?  ? ?Lower Extremity Assessment ?Lower Extremity Assessment: RLE deficits/detail;LLE deficits/detail ?RLE Deficits / Details: h/o AKA ?LLE Deficits / Details: pain with PROM primarily at knee ?  ? ?   ?Communication  ? Communication: Receptive difficulties;Expressive difficulties  ?Cognition Arousal/Alertness: Lethargic ?Behavior During Therapy: Flat affect ?Overall Cognitive Status: Difficult to assess ?  ?  ?  ?  ?  ?  ?  ?  ?  ?  ?  ?  ?  ?  ?  ?  ?General Comments: Garbled, mumbling speech. Not following commands. ?  ?  ? ?  ?General Comments   ? ?  ?Exercises    ? ?Assessment/Plan  ?  ?PT Assessment Patient needs continued PT services  ?PT Problem List Decreased strength;Decreased mobility;Decreased safety awareness;Pain;Decreased balance;Decreased activity tolerance;Decreased cognition ? ?   ?  ?PT Treatment Interventions Therapeutic activities;Therapeutic exercise;Patient/family education;Balance training;Functional mobility training   ? ?PT Goals (Current goals can be found in the Care Plan section)  ?Acute Rehab PT Goals ?Patient Stated Goal: home per mother ?PT Goal Formulation: With family ?Time For Goal Achievement: 06/25/21 ?Potential to Achieve Goals: Fair ? ?  ?Frequency Min 2X/week ?  ? ? ?Co-evaluation   ?  ?  ?  ?  ? ? ?  ?AM-PAC PT "6 Clicks" Mobility  ?Outcome Measure Help needed turning from your back to your side while in a flat bed without using bedrails?: Total ?Help needed moving from lying on your back to sitting on the side of a flat bed without using bedrails?: Total ?Help needed moving to and from a bed to a chair (including a wheelchair)?: Total ?Help needed standing up from a chair using your arms (e.g., wheelchair or bedside chair)?: Total ?Help needed to walk in hospital room?: Total ?Help needed climbing 3-5 steps with a railing? :  Total ?6 Click Score: 6 ? ?  ?End of Session   ?Activity Tolerance: Patient limited by lethargy ?Patient left: in bed;with call bell/phone within reach;with bed alarm set;with family/visitor present ?Nurse Communication: Mobility status ?PT Visit Diagnosis: Other abnormalities of gait and mobility (R26.89);Pain;Muscle weakness (generalized) (M62.81) ?Pain - Right/Left: Left ?Pain - part of body: Leg ?  ? ?Time: 1203-1217 ?PT Time Calculation (min) (ACUTE ONLY): 14 min ? ? ?Charges:   PT Evaluation ?$PT Eval Moderate Complexity: 1 Mod ?  ?  ?   ? ? ?Lorrin Goodell, PT  ?Office # [phone] ?Pager [phone] ? ? ?Lorriane Shire ?06/11/2021, 12:34 PM ? ?

## 2021-06-11 NOTE — Progress Notes (Signed)
PT Cancellation Note ? ?Patient Details ?Name: Breland Elders ?MRN: [id number] ?DOB: 11-Sep-1965 ? ? ?Cancelled Treatment:    Reason Eval/Treat Not Completed: Patient at procedure or test/unavailable. Tech in room to begin EEG. PT to re-attempt as time allows. ? ? ?Lorriane Shire ?06/11/2021, 9:49 AM ? ?Lorrin Goodell, PT  ?Office # [phone] ?Pager [phone] ? ?

## 2021-06-11 NOTE — Procedures (Signed)
Patient Name: Cassandra Andrade  ?MRN: [id number]  ?Epilepsy Attending: Lora Havens  ?Referring Physician/Provider: Amie Portland, MD ?Date: 06/11/2021 ?Duration: 26.26 mins ? ?Patient history:  56 year old female with a history of hemorrhagic stroke and seizures who presents with altered mental status. EEG to evaluate for seizure.  ?  ? ?Level of alertness: Awake ? ?AEDs during EEG study:  BRV, LTG, LCM, Ativan ? ?Technical aspects: This EEG study was done with scalp electrodes positioned according to the 10-20 International system of electrode placement. Electrical activity was acquired at a sampling rate of '500Hz'$  and reviewed with a high frequency filter of '70Hz'$  and a low frequency filter of '1Hz'$ . EEG data were recorded continuously and digitally stored.  ? ?Description: EEG showed continuous generalized 5 to 8 Hz theta-alpha activity admixed with 2 to 3 Hz delta slowing.  There is also sharply contoured 3 to 5 Hz theta-delta slowing in right frontal region, maximal F8 consistent with breach artifact.  Hyperventilation and photic stimulation were not performed.    ?  ?ABNORMALITY ?- Continuous slow, generalized ?- Breach artifact, right frontal region ?  ?IMPRESSION: ?This EEG is suggestive of cortical dysfunction in right frontal consistent with prior craniotomy.  Additionally there is moderate diffuse encephalopathy, nonspecific to etiology.  No seizures were seen throughout the recording. ?  ?Lora Havens  ?

## 2021-06-11 NOTE — Consult Note (Signed)
? ?                                                                                ?Consultation Note ?Date: 06/11/2021  ? ?Patient Name: Cassandra Andrade  ?DOB: 10-Sep-1965  MRN: [id number]  Age / Sex: 56 y.o., female  ?PCP: Berkley Harvey, NP ?Referring Physician: Flora Lipps, MD ? ?Reason for Consultation: Establishing goals of care ? ?HPI/Patient Profile: 56 y.o. female  with past medical history of seizure disorder, stroke r/t aneurysm rupture s/p VNS placement, hx of DVT, Lupus (Coumadin), hx of osteosarcoma s/p right AKA, and depression admitted on 06/09/2021 with confusion, combative behavior, and poor PO intake.  ? ?Pt is being treated for UTI, supratherapuetic INR, aspiration PNA, and elevated CK.  ? ?Clinical Assessment and Goals of Care: ?I have reviewed medical records including EPIC notes, labs and imaging, assessed the patient and then met with patient, patient's mother, and patient's sons  to discuss diagnosis prognosis, GOC, EOL wishes, disposition and options. ? ?I introduced Palliative Medicine as specialized medical care for people living with serious illness. It focuses on providing relief from the symptoms and stress of a serious illness. The goal is to improve quality of life for both the patient and the family. ? ?We discussed a brief life review of the patient. Family describes patient as strong willed, funny with a sense of dark humor, and quick witted. Patient's mother shares she was told patient would not live to see her 58st birthday.  ? ?As far as functional and nutritional status patient was able to perform ADLs with minimal standby assistance up until about a month ago. Family shares patient started to decline to participate in ADLs and became combative when attempting to perform them. They also state she has not taken PO meds in approximately the last week PTA.  ? ?We discussed patient's current illness and what it means in the larger context of patient's on-going co-morbidities.  I  attempted to elicit values and goals of care important to the patient.  Mother shares that patient has always wanted to be at home and does not want to go to a rehab facility if at all feasible. She shares her daughter is a Nurse, adult. ? ?Mother shares that neither she nor the patient would want the patient to live on a ventilator or machine long term. However, mother states she would want a code blue, CPR performed, and for pt to be placed on a ventilator temporarily in order for family to have a chance to say goodbye. Long term ventilation is not within the patient's wishes, as per mom. We briefly disucssed placing a feeding tube for nutrition and mother was not as clear or definitive on what her wishes for patient would be. I shared that was not an issue at this time but it is part of the bigger picture of options that may need to be discussed.  ? ?Family is facing treatment option decisions, advanced directive, and anticipatory care needs. Mother is requesting more help in the home since mother is of advanced age and fears she is unable to meet needs of her daughter now. She wants to have HH 5-7 days/week.  I advised her that [facility] SW will work on these needs at the appropriate time. ?  ?Discussed with patient/family the importance of continued conversation with family and the medical providers regarding overall plan of care and treatment options, ensuring decisions are within the context of the patient?s values and GOCs.   ? ?Palliative Care services outpatient were explained and offered. Mother was receptive and order will be placed when appropriate/as discharge approaches.  ? ?Questions and concerns were addressed. The family was encouraged to call with questions or concerns.  ? ?Primary Decision Maker ?NEXT OF KIN ? ?Code Status/Advance Care Planning: ?Full code ? ?Prognosis:   ?Unable to determine ? ?Discharge Planning: To Be Determined ? ?Primary Diagnoses: ?Present on Admission: ? Hypothyroid ? Acute metabolic  encephalopathy ? Sepsis ([place]) ? DVT, bilateral lower limbs ([place]) ? Lupus anticoagulant disorder ([place]) ? Supratherapeutic INR ? Hypernatremia ? Dehydration ? UTI (urinary tract infection) ? Elevated CK ? AKI (acute kidney injury) ([place]) ? Depression ? 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 Aspiration pneumonia ([place]) ? Constipation ? ? ?Physical Exam ?Vitals reviewed.  ?HENT:  ?   Head: Normocephalic and atraumatic.  ?   Mouth/Throat:  ?   Mouth: Mucous membranes are moist.  ?Cardiovascular:  ?   Rate and Rhythm: Normal rate.  ?   Pulses: Normal pulses.  ?Pulmonary:  ?   Effort: Pulmonary effort is normal.  ?Abdominal:  ?   Palpations: Abdomen is soft.  ?Musculoskeletal:  ?   Comments: Generalized weakness, left AKA  ?Neurological:  ?   Comments: Intermittently mumbles non-sensical words  ? ? ?Palliative Assessment/Data: 30% ? ? ? ? ?I discussed this patient's plan of care with patient, patient's mother, patients sons. ? ?Thank you for this consult. Palliative medicine will continue to follow and assist holistically.  ? ?Time Total: 75 minutes ?Greater than 50%  of this time was spent counseling and coordinating care related to the above assessment and plan. ? ?Signed by: ?Jordan Hawks, DNP, FNP-BC ?Palliative Medicine ? ?  ?Please contact Palliative Medicine Team phone at [phone] for questions and concerns.  ?For individual provider: See Amion ? ? ? ? ? ? ? ? ? ? ? ? ?  ?

## 2021-06-11 NOTE — Procedures (Signed)
Patient Name: Cassandra Andrade  ?MRN: [id number]  ?Epilepsy Attending: Lora Havens  ?Referring Physician/Provider: Greta Doom, MD ?Duration: [phone] 2057 to 06/11/2021 8185 ? ?Patient history: 56 year old female with a history of hemorrhagic stroke and seizures who presents with altered mental status. Rapid eeg to evaluate for seizure.  ? ?AEDs during EEG study: BRV, LTG, LCM ? ?Technical aspects: This EEG was obtained using a 10 lead EEG system positioned circumferentially without any parasagittal coverage (rapid EEG). Computer selected EEG is reviewed as  well as background features and all clinically significant events. ? ?Description: EEG showed continuous generalized 5 to 8 Hz theta-alpha activity admixed with 2 to 3 Hz delta slowing.  There is also sharply contoured 3 to 5 Hz theta-delta slowing in right frontal region, maximal F8 consistent with breach artifact.  Hyperventilation and photic stimulation were not performed.    ? ?ABNORMALITY ?- Continuous slow, generalized ?- Breach artifact, right frontal region ?  ?IMPRESSION: ?This limited ceribell EEG is suggestive of cortical dysfunction in right frontal consistent with prior craniotomy.  Additionally there is moderate diffuse encephalopathy, nonspecific to etiology.  No seizures were seen throughout the recording. ? ?If concern for ictal-interictal activity persists, conventional EEG can be considered. ?  ? ?Alveta Quintela [name]  ? ?

## 2021-06-11 NOTE — Evaluation (Signed)
Clinical/Bedside Swallow Evaluation ?Patient Details  ?Name: Cassandra Andrade ?MRN: [id number] ?Date of Birth: 09-08-1965 ? ?Today's Date: 06/11/2021 ?Time: SLP Start Time (ACUTE ONLY): [id number] SLP Stop Time (ACUTE ONLY): 9678 ?SLP Time Calculation (min) (ACUTE ONLY): 16 min ? ?Past Medical History:  ?Past Medical History:  ?Diagnosis Date  ? Anxiety   ? Arthritis   ? Cancer [facility])   ? Osteosarcoma, right leg  ? CVA (cerebrovascular accident due to intracerebral hemorrhage) ([place]) 2004  ? Depression   ? DVT (deep venous thrombosis) ([place])   ? Left leg  ? MRSA (methicillin resistant Staphylococcus aureus)   ? S/P BKA (below knee amputation) unilateral ([place])   ? Right   ? Seizures ([place])   ? Thyroid disease   ? 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 ?Past Surgical History:  ?Past Surgical History:  ?Procedure Laterality Date  ? ABOVE KNEE LEG AMPUTATION Right   ? Osteosarcoma  ? BRAIN SURGERY  2004  ? cerebral aneurysm  ? FRACTURE SURGERY    ? Rt. femur, folllowed by MRSA  ? TOTAL KNEE ARTHROPLASTY Right   ? ?HPI:  ?Per MD "56 year old Female.  Past medical history of stroke, seizures, SAH, DVT/PE (warfarin presented with altered mental status. had a medication change 1 week ago.  Pt seen by neurology and diagnosed with encephalopathy. 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 There is partial consolidative  changes of the right lower lobe consistent with atelectasis or  pneumonia. Dedicated chest CT is recommended for better evaluation of the lungs." Brain imaging showed  "Extensive encephalomalacia within the  right frontal, temporal, and parietal lobes, right insular cortex,  and deep gray nuclei are again identified" BSE generated  ?  ?Assessment / Plan / Recommendation  ?Clinical Impression ? Pt seen for limited BSE d/t decreased alertness level/mentation decreased, but pt did follow simple directives with mod-max verbal/tactile cues.   Pt with prolonged oral preparation/awareness of bolus and oral holding with all consistencies assessed. Ice chips elicited a delayed throat clearing and a swallow delay was noted with all consistencies as well.  Pt did exhibit a weak cough and initiated a dry swallow prior to BSE.  OME limited d/t participation, but grossly WFL.  Recommend initiating a conservative diet of Dysphagia 1/thin liquids with FULL supervision/assistance with feeding with mod verbal/tactile cues provided during all PO intake.  ST will f/u in acute setting for diet tolerance/upgrade as pt able.  Thank you for this consult. ?SLP Visit Diagnosis: Dysphagia, oropharyngeal phase (R13.12) ?   ?Aspiration Risk ? Mild aspiration risk  ?  ?Diet Recommendation   Dysphagia 1/thin liquids ? ?Medication Administration: Crushed with puree  ?  ?Other  Recommendations Oral Care Recommendations: Oral care BID   ? ?Recommendations for follow up therapy are one component of a multi-disciplinary discharge planning process, led by the attending physician.  Recommendations may be updated based on patient status, additional functional criteria and insurance authorization. ? ?Follow up Recommendations Follow physician's recommendations for discharge plan and follow up therapies  ? ? ?  ?Assistance Recommended at Discharge Frequent or constant Supervision/Assistance  ?Functional Status Assessment Patient has had a recent decline in their functional status and demonstrates the ability to make significant improvements in function in a reasonable and predictable amount of time.  ?Frequency and Duration min 2x/week  ?1 week ?  ?   ? ?Prognosis Prognosis for Safe Diet Advancement: Good  ? ?  ? ?  Swallow Study   ?General Date of Onset: 06/10/21 ?HPI: Per MD "56 year old Female.  Past medical history of stroke, seizures, SAH, DVT/PE (warfarin presented with altered mental status. had a medication change 1 week ago.  Pt seen by neurology and diagnosed with encephalopathy.  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 There is partial consolidative  changes of the right lower lobe consistent with atelectasis or  pneumonia. Dedicated chest CT is recommended for better evaluation of the lungs." Brain imaging showed  "Extensive encephalomalacia within the  right frontal, temporal, and parietal lobes, right insular cortex,  and deep gray nuclei are again identified"BSE generated ?Type of Study: Bedside Swallow Evaluation ?Previous Swallow Assessment: [place] not passed 06/10/21 d/t lethargy ?Diet Prior to this Study: NPO ?Temperature Spikes Noted: No ?Respiratory Status: Nasal cannula ?History of Recent Intubation: No ?Behavior/Cognition: Cooperative;Requires cueing;Lethargic/Drowsy ?Oral Cavity Assessment: Within Functional Limits ?Oral Care Completed by SLP: Yes ?Oral Cavity - Dentition: Edentulous ?Self-Feeding Abilities: Needs assist ?Patient Positioning: Upright in bed ?Baseline Vocal Quality: Low vocal intensity (decreased intelligibility) ?Volitional Cough: Weak ?Volitional Swallow: Able to elicit  ?  ?Oral/Motor/Sensory Function Overall Oral Motor/Sensory Function:  (Limited OME d/t pt mentation; grossly WFL)   ?Ice Chips Ice chips: Impaired ?Presentation: Spoon ?Oral Phase Impairments: Reduced lingual movement/coordination;Poor awareness of bolus ?Oral Phase Functional Implications: Oral holding ?Pharyngeal Phase Impairments: Throat Clearing - Delayed   ?Thin Liquid Thin Liquid: Impaired ?Presentation: Straw;Spoon ?Oral Phase Impairments: Reduced labial seal;Reduced lingual movement/coordination;Poor awareness of bolus ?Oral Phase Functional Implications: Oral holding;Prolonged oral transit ?Pharyngeal  Phase Impairments: Suspected delayed Swallow  ?  ?Nectar Thick Nectar Thick Liquid: Not tested   ?Honey Thick Honey Thick Liquid: Not tested   ?Puree Puree: Impaired ?Presentation: Spoon ?Oral Phase Impairments:  Reduced lingual movement/coordination ?Oral Phase Functional Implications: Oral holding;Prolonged oral transit ?Pharyngeal Phase Impairments: Suspected delayed Swallow   ?Solid ? ? ?  Solid: Impaired ?Presentation: Spoon ?Oral Phase Impairments: Impaired mastication;Poor awareness of bolus;Reduced lingual movement/coordination ?Oral Phase Functional Implications: Impaired mastication;Prolonged oral transit;Oral holding ?Pharyngeal Phase Impairments: Suspected delayed Swallow  ? ?  ? ?Elvina Sidle, M.S., CCC-SLP ?06/11/2021,12:53 PM ? ? ? ?

## 2021-06-12 DIAGNOSIS — F32A Depression, unspecified: Secondary | ICD-10-CM | POA: Diagnosis not present

## 2021-06-12 DIAGNOSIS — G9341 Metabolic encephalopathy: Secondary | ICD-10-CM | POA: Diagnosis not present

## 2021-06-12 DIAGNOSIS — E860 Dehydration: Secondary | ICD-10-CM | POA: Diagnosis not present

## 2021-06-12 DIAGNOSIS — R569 Unspecified convulsions: Secondary | ICD-10-CM | POA: Diagnosis not present

## 2021-06-12 DIAGNOSIS — N179 Acute kidney failure, unspecified: Secondary | ICD-10-CM | POA: Diagnosis not present

## 2021-06-12 DIAGNOSIS — I6990 Unspecified sequelae of unspecified cerebrovascular disease: Secondary | ICD-10-CM | POA: Diagnosis not present

## 2021-06-12 DIAGNOSIS — E43 Unspecified severe protein-calorie malnutrition: Secondary | ICD-10-CM

## 2021-06-12 DIAGNOSIS — J690 Pneumonitis due to inhalation of food and vomit: Secondary | ICD-10-CM | POA: Diagnosis not present

## 2021-06-12 LAB — CBC
HCT: 32.7 % — ABNORMAL LOW (ref 36.0–46.0)
Hemoglobin: 10.4 g/dL — ABNORMAL LOW (ref 12.0–15.0)
MCH: 28 pg (ref 26.0–34.0)
MCHC: 31.8 g/dL (ref 30.0–36.0)
MCV: 87.9 fL (ref 80.0–100.0)
Platelets: 214 10*3/uL (ref 150–400)
RBC: 3.72 MIL/uL — ABNORMAL LOW (ref 3.87–5.11)
RDW: 12.9 % (ref 11.5–15.5)
WBC: 5.8 10*3/uL (ref 4.0–10.5)
nRBC: 0 % (ref 0.0–0.2)

## 2021-06-12 LAB — BASIC METABOLIC PANEL
Anion gap: 8 (ref 5–15)
BUN: 5 mg/dL — ABNORMAL LOW (ref 6–20)
CO2: 28 mmol/L (ref 22–32)
Calcium: 8.3 mg/dL — ABNORMAL LOW (ref 8.9–10.3)
Chloride: 102 mmol/L (ref 98–111)
Creatinine, Ser: 0.7 mg/dL (ref 0.44–1.00)
GFR, Estimated: >60 mL/min (ref >60)
Glucose, Bld: 138 mg/dL — ABNORMAL HIGH (ref 70–99)
Potassium: 3 mmol/L — ABNORMAL LOW (ref 3.5–5.1)
Sodium: 138 mmol/L (ref 135–145)

## 2021-06-12 LAB — CULTURE, BLOOD (ROUTINE X 2): Special Requests: ADEQUATE

## 2021-06-12 LAB — CK: Total CK: 727 U/L — ABNORMAL HIGH (ref 38–234)

## 2021-06-12 LAB — PROTIME-INR
INR: 3.7 — ABNORMAL HIGH (ref 0.8–1.2)
Prothrombin Time: 36.5 seconds — ABNORMAL HIGH (ref 11.4–15.2)

## 2021-06-12 LAB — MAGNESIUM: Magnesium: 1.8 mg/dL (ref 1.7–2.4)

## 2021-06-12 MED ORDER — POTASSIUM CHLORIDE 10 MEQ/100ML IV SOLN
10.0000 meq | INTRAVENOUS | Status: AC
  Administered 2021-06-12 (×6): 10 meq via INTRAVENOUS
  Filled 2021-06-12 (×6): qty 100

## 2021-06-12 MED ORDER — WARFARIN - PHARMACIST DOSING INPATIENT: Freq: Every day | Status: DC

## 2021-06-12 NOTE — Progress Notes (Signed)
LTM maint complete - no skin breakdown under:  FP1 Fp2 C3 ?

## 2021-06-12 NOTE — Progress Notes (Signed)
VNS: Aspire model #105 ?

## 2021-06-12 NOTE — Progress Notes (Addendum)
?PROGRESS NOTE ? ? ? ?Cassandra Andrade  GUY:[id number] DOB: [birth year] DOA: 06/09/2021 ?PCP: Berkley Harvey, NP  ? ? ?Brief Narrative:  ?Patient is a 56 y.o. female with medical history significant of seizure disorder,  prior history of stroke related to aneurysm rupture, status post VNS placement, history of DVT and lupus anticoagulant on Coumadin, history of right leg osteosarcoma status post amputation and MRSA  presented to hospital with acute confusion and mental status changes over 3 to 4 days after having seizure medication changed one week prior (Keppra to [place]).  In the ED, patient was noted to be hypernatremic with acute kidney injury and UTI.  Patient was also seen by telemetry neurology who made changes to seizure medication regimen.  Patient was then considered for admission to the hospital. ? ?Assessment and Plan: ?Principal Problem: ?  Acute metabolic encephalopathy ?Active Problems: ?  Sepsis ([place]) ?  Intractable epilepsy ([place]) ?  UTI (urinary tract infection) ?  Hypothyroid ?  AKI (acute kidney injury) ([place]) ?  Depression ?  DVT, bilateral lower limbs ([place]) ?  Supratherapeutic INR ?  Elevated CK ?  Dehydration ?  Hypernatremia ?  S/P BKA (below knee amputation) ([place]) ?  Seizure ([place]) ?  Lupus anticoagulant disorder ([place]) ?  Cerebrovascular accident, late effects ?  Personal history of osteosarcoma ?  Aspiration pneumonia ([place]) ?  Constipation ?  Protein-calorie malnutrition, severe ?  ?Acute metabolic encephalopathy ?Likely secondary to UTI, dehydration and hypernatremia.  Continue IV fluids.  Neurology has seen the patient.  Received EEG monitoring.  He is currently on IV briviact '100mg'$  BID,  lamotrignine '350mg'$  BID, and vimpat '200mg'$  bid.  Will follow neurology recommendations.  MRI of the brain has been ordered. ? ?Rule out seizures and status epilepticus.  Received continuous EEG.  On brivaracetam, Vimpat.  Continue to hold venlafaxine and olanzapine.  Neurology following.  Follow MRI.  Neurology  recommends adding 1 mg Ativan every 6 hours for possibility of neuroleptic malignant syndrome due to elevated CK and altered mental status but patient was extremely sedated after trial of Ativan showed with discontinued. ? ?Sepsis ([place]) secondary to UTI. ?Patient had a fever with tachycardia and urinalysis showed  UTI.  On empiric IV Unasyn.  Urine culture showing multiple species at this time.  Blood cultures showing gram-positive cocci in anaerobic bottles only, Staph epidermidis.  Likely contaminant..  We will continue to follow blood cultures. ?  ?S/P BKA (below knee amputation) ([place]) ?Chronic stable ?  ?Hypothyroidism ?On IV Synthroid at this time.  Resume oral when mental status improves. Started on dysphagia 1 diet at this time. ?   ?DVT, bilateral lower limbs ([place]) ?-Presented with supratherapeutic INR.  On Coumadin as outpatient..  INR of 3.1 today.  Pharmacy to manage.  ?  ?Lupus anticoagulant disorder ([place]) ?On Coumadin as outpatient.  Currently on hold due to supratherapeutic INR.  Pharmacy to manage.  Latest INR of 3.1. ?  ?Personal history of osteosarcoma ?Status post right BKA. ?  ?Supratherapeutic INR ?Patient presented with INR of 7.6.  Coumadin on hold.  Received 1 dose of vitamin K.  Latest INR of 3.1 pharmacy to manage ?  ?Hypernatremia ?On D5 water.  Sodium level has improved to 138 this morning.  Continue to monitor. ? ?Mild hypokalemia.  Continue to replenish through IV.  Potassium today at 3.0.  Check BMP in AM. ? ?Dehydration ?Patient with decreased oral intake at home.  On D5 water. ?  ?Elevated CK, likely mild  rhabdomyolysis. ?Will continue to rehydrate.    CK level today at 727 improved from 1454. ?  ?AKI (acute kidney injury) ([place]) ?Likely secondary to volume depletion.  Creatinine today at 0.7 from 1.0<1.9.  Improving. ? ?Depression ?On Effexor, Zyprexa at home.   Resume when appropriate. ?  ?Intractable epilepsy ([place]) ?Neurology on board.  Currently on IV AED. ?  ?Aspiration pneumonia  ([place]) ?Likely secondary to metabolic encephalopathy.  On Unasyn.    Speech therapy on board and recommend dysphagia 1 diet at this time ? ?Severe protein calorie malnutrition. ?Present on admission.  Seen by dietitian. ? ?Goals of care.  Palliative care on board and plan is to continue with full scope of treatment. ?   ? ? DVT prophylaxis: SCDs Start: 06/10/21 0036 ? ? ?Code Status:   ?  Code Status: Full Code ? ?Disposition: Home with home Health. ? ?Status is: Inpatient ? ?Remains inpatient appropriate because: continuous EEG, metabolic encephalopathy, deconditioning debility ? ? Family Communication: ?Spoke with the patient's mother on the phone and updated her about the clinical condition of the patient ? ?Consultants:  ?Neurology ?Palliative care ? ?Procedures:  ?Continuous EEG ? ?Antimicrobials:  ?Unasyn 4/19> ? ?Anti-infectives (From admission, onward)  ? ? Start     Dose/Rate Route Frequency Ordered Stop  ? 06/10/21 2300  cefTRIAXone (ROCEPHIN) 2 g in sodium chloride 0.9 % 100 mL IVPB  Status:  Discontinued       ? 2 g ?200 mL/hr over 30 Minutes Intravenous Every 24 hours 06/09/21 2325 06/10/21 0123  ? 06/10/21 1600  Ampicillin-Sulbactam (UNASYN) 3 g in sodium chloride 0.9 % 100 mL IVPB       ? 3 g ?200 mL/hr over 30 Minutes Intravenous Every 6 hours 06/10/21 0953    ? 06/10/21 0200  Ampicillin-Sulbactam (UNASYN) 3 g in sodium chloride 0.9 % 100 mL IVPB  Status:  Discontinued       ? 3 g ?200 mL/hr over 30 Minutes Intravenous Every 8 hours 06/10/21 0129 06/10/21 0953  ? 06/09/21 2300  cefTRIAXone (ROCEPHIN) 1 g in sodium chloride 0.9 % 100 mL IVPB       ? 1 g ?200 mL/hr over 30 Minutes Intravenous  Once 06/09/21 2247 06/09/21 2329  ? ?  ? ?Subjective: ?Today, patient was seen and examined at bedside.  Appears to be confused and mumbling speech.  ? ?Objective: ?Vitals:  ? 06/11/21 2331 06/12/21 0307 06/12/21 0835 06/12/21 1206  ?BP: 135/65 (!) 146/69 118/75 109/76  ?Pulse: 92 93 90 (!) 104  ?Resp: 17 17     ?Temp: 98.6 ?F (37 ?C) 98.6 ?F (37 ?C) 98.4 ?F (36.9 ?C) 98.5 ?F (36.9 ?C)  ?TempSrc: Oral Oral Oral Oral  ?SpO2: 100% 100% 100% 100%  ? ? ?Intake/Output Summary (Last 24 hours) at 06/12/2021 1313 ?Last data filed at 06/12/2021 [id number] ?Gross per 24 hour  ?Intake 2647.17 ml  ?Output 2050 ml  ?Net 597.17 ml  ? ?There were no vitals filed for this visit. ? ?Physical Examination: ? ?General:  Average built, not in obvious distress,alert awake and confused, mumbled speech ?HENT:   No scleral pallor or icterus noted. Oral mucosa is moist.  ?Chest:    Diminished breath sounds bilaterally. No crackles or wheezes.  ?CVS: S1 &S2 heard. No murmur.  Regular rate and rhythm. ?Abdomen: Soft, nontender, nondistended.  Bowel sounds are heard.   ?Extremities: No cyanosis, clubbing or edema.  Peripheral pulses are palpable.  Status post right above knee amputation. ?Psych:  Alert, awake but disoriented and confused, oriented to place, incomprehensible speech, ?CNS:  No cranial nerve deficits.  Follows simple commands, raises extremities. ?Skin: Warm and dry.  No rashes noted. ? ?Data Reviewed:  ? ?CBC: ?Recent Labs  ?Lab 06/09/21 ?2216 06/10/21 ?2992 06/11/21 ?0250 06/12/21 ?4268  ?WBC 9.0 9.0 7.4 5.8  ?NEUTROABS 7.2  --   --   --   ?HGB 11.9* 11.0* 9.9* 10.4*  ?HCT 38.5 36.0 32.4* 32.7*  ?MCV 92.3 92.5 92.0 87.9  ?PLT 229 205 199 214  ? ? ?Basic Metabolic Panel: ?Recent Labs  ?Lab 06/09/21 ?2216 06/10/21 ?3419 06/10/21 ?1655 06/11/21 ?0250 06/12/21 ?6222  ?NA 151* 150* 143 145 138  ?K 4.0 3.9 3.5 3.3* 3.0*  ?CL 115* 119* 112* 112* 102  ?CO2 '25 25 24 26 28  '$ ?GLUCOSE 132* 127* 288* 122* 138*  ?BUN 29* 24* 17 12 5*  ?CREATININE 1.90* 1.40* 1.04* 1.05* 0.70  ?CALCIUM 9.1 8.1* 7.6* 8.3* 8.3*  ?MG 2.2 2.0  --   --  1.8  ?PHOS 4.5 3.1  --   --   --   ? ? ?Liver Function Tests: ?Recent Labs  ?Lab 06/09/21 ?2216 06/10/21 ?9798  ?AST 33 36  ?ALT 17 17  ?ALKPHOS 82 74  ?BILITOT 0.6 0.4  ?PROT 7.7 6.7  ?ALBUMIN 3.6 3.1*  ? ? ? ?Radiology  Studies: ?EEG adult ? ?Result Date: 06/11/2021 ?Lora Havens, MD     06/11/2021 12:20 PM Patient Name: Cassandra Andrade MRN: [id number] Epilepsy Attending: Lora Havens Referring Physician/Provider: Amie Portland,

## 2021-06-12 NOTE — Progress Notes (Addendum)
Neurology Progress Note ? ? ?S:// ?She is awake and alert and oriented in NAD. No family at bedside. RN at bedside. No seizure like activity noted. Scheduled ativan was discontinued due to increased lethargy yesterday. LTM in place ? ? ?O:// ?Current vital signs: ?BP 118/75 (BP Location: Left Arm)   Pulse 90   Temp 98.4 ?F (36.9 ?C) (Oral)   Resp 17   SpO2 100%  ?Vital signs in last 24 hours: ?Temp:  [97.6 ?F (36.4 ?C)-98.9 ?F (37.2 ?C)] 98.4 ?F (36.9 ?C) (04/21 4888) ?Pulse Rate:  [90-95] 90 (04/21 0835) ?Resp:  [14-17] 17 (04/21 0307) ?BP: (116-162)/(64-77) 118/75 (04/21 0835) ?SpO2:  [100 %] 100 % (04/21 0835) ? ?General: Appears awake alert in NAD ?HEENT: Normocephalic atraumatic. edentulous ?Lungs: Clear ?Cardiovascular: Regular rhythm ?Abdomen nondistended nontender ? ?Neurological exam ?Awake and alert x 1. ?Speech is dysarthric and at times incomprehensible. Follows commands ?Cranial nerves: Pupils are equal round reactive to light, she is able to move her eyes to both directions spontaneously  Face is symmetric. ?Motor examination: Raises both upper extremities antigravity.  LEft leg wiggles toes, on massive movement of left leg she cried out in pain. Lifts R BKA stump.  ?Coordination difficult to assess  ? ?Medications ? ?Current Facility-Administered Medications:  ?  acetaminophen (TYLENOL) tablet 650 mg, 650 mg, Oral, Q6H PRN **OR** acetaminophen (TYLENOL) suppository 650 mg, 650 mg, Rectal, Q6H PRN, Doutova, Anastassia, MD ?  Ampicillin-Sulbactam (UNASYN) 3 g in sodium chloride 0.9 % 100 mL IVPB, 3 g, Intravenous, Q6H, Adrian Saran, RPH, Last Rate: 200 mL/hr at 06/12/21 0416, 3 g at 06/12/21 0416 ?  bisacodyl (DULCOLAX) suppository 10 mg, 10 mg, Rectal, Daily PRN, Doutova, Anastassia, MD ?  brivaracetam (BRIVIACT) 100 mg in sodium chloride 0.9 % 50 mL, 100 mg, Intravenous, Q12H, Plancher, Baron Sane, MD, Last Rate: 240 mL/hr at 06/11/21 2230, 100 mg at 06/11/21 2230 ?  dextrose 5 % solution, ,  Intravenous, Continuous, Donne Hazel, MD, Last Rate: 75 mL/hr at 06/12/21 0002, New Bag at 06/12/21 0002 ?  feeding supplement (ENSURE ENLIVE / ENSURE PLUS) liquid 237 mL, 237 mL, Oral, BID BM, Pokhrel, Laxman, MD ?  lacosamide (VIMPAT) 200 mg in sodium chloride 0.9 % 25 mL IVPB, 200 mg, Intravenous, Q12H, Plancher, Baron Sane, MD, Stopped at 06/12/21 9169 ?  lamoTRIgine (LAMICTAL) tablet 350 mg, 350 mg, Oral, BID, Plancher, Baron Sane, MD, 350 mg at 06/11/21 2142 ?  [START ON 06/17/2021] levothyroxine (SYNTHROID, LEVOTHROID) injection 25 mcg, 25 mcg, Intravenous, Daily, Donne Hazel, MD ?  MEDLINE mouth rinse, 15 mL, Mouth Rinse, BID, Pokhrel, Laxman, MD, 15 mL at 06/11/21 2108 ?  multivitamin with minerals tablet 1 tablet, 1 tablet, Oral, Daily, Pokhrel, Laxman, MD ?  thiamine (B-1) injection 100 mg, 100 mg, Intravenous, Daily, Doutova, Anastassia, MD, 100 mg at 06/11/21 1132 ? ? ?Imaging ?I have reviewed images in epic and the results pertinent to this consultation are: ?CT head negative ? ?LTM EEG 4/20-4/21 ?ABNORMALITY ?- Continuous slow, generalized ?- Breach artifact, right frontal region ?  ?IMPRESSION: ?This EEG is suggestive of cortical dysfunction in right frontal consistent with prior craniotomy.  Additionally there is moderate diffuse encephalopathy, nonspecific to etiology.  No seizures were seen throughout the recording.  ? ?CK 727 down from 1454 ? ?Assessment: 56 year old past history of hemorrhagic stroke and seizures presented with altered mental status worsening on the hospital.  Continues to be encephalopathic on exam.  Concern for underlying seizures/status of Depakote. ?  Hooked up to rapid EEG overnight-no definitive seizures captured on it per preliminary review from the overnight neurologist. ?Plan  to LTM, and obtain MRI. ?Given increased CK on labs and altered mental status-NMS have to be considered as differential as well. ? ?Impression: ?Toxic metabolic encephalopathy, evaluate for  seizures/status epilepticus ?Concern for NMS ? ?Recommendations: ?- Continue to hold home venlafaxine and olanzapine  ?- Continue brivaracetam 100 twice daily IV ?- Continue Vimpat 200 mg twice daily IV ?- MRI brain when able to - will have MRI safe leads for EEG placed. Techs aware. ?- Continue hydration per primary team ?- Treat infection per primary  ?-Continue LTM-if exam improves and EEG does not show any seizures, will consider discontinuing tomorrow. ? ?Beulah Gandy DNP, ACNPC-AG ? ?Attending Neurohospitalist Addendum ?Patient seen and examined with APP/Resident. ?Agree with the history and physical as documented above. ?Agree with the plan as documented, which I helped formulate. ?I have independently reviewed the chart, obtained history, review of systems and examined the patient.I have personally reviewed pertinent head/neck/spine imaging (CT/MRI). ?Plan discussed with primary hospitalist Dr. Louanne Belton ?Please feel free to call with any questions. ? ?-- ?Amie Portland, MD ?Neurologist ?[facility] ?Pager: [phone] ? ? ? ? ? ? ?

## 2021-06-12 NOTE — Progress Notes (Addendum)
Progress note: ? ?PE: ?Patient is making more efforts to speak and verbalize but is unable to make linear trains of thought.  She knows her name and that she has three sons and a mother, but is unable to give any more information or answer other orientation questions. She is able to move her right arm more than she was yesterday.  She acknowledges my presence but is unable to make her wishes known.  No family at bedside presently. ? ?Plan: ? ?After reviewing the patient's chart and assessing the patient at bedside, I spoke with the patient's mother regarding patient's current health status. I shared the above medical update.  Mother shared concerns that she is not able to see her daughter in person but is hopeful that she is making improvements.  We discussed that patient is slightly more verbal and that she is able to move her right arm.  However, I cautioned the mother to not have false hope and that we would continue to take her improvements 1 day at a time.  Mother was in agreement and shared that she is hopeful but also realistic.   ? ?I also gave her number to nurses's station for her to speak directly with  nursing regarding medical updates.  She shares she is grateful for these updates and is continuing to just hope that her daughter improves. ? ?No changes to current plan of care.  EEG still in place.   ? ?Plan remains full code/full scope and watchful waiting.  ? ?PMT will continue to follow the patient throughout her hospitalization.  For any urgent palliative needs over the weekend please utilize [facility] for providers available.   am off service this weekend and plan to return and round on patient on Monday. ? ?Verdell Carmine. Kili Gracy, DNP, FNP-BC ?Palliative Medicine Team ?Team Phone # [phone] ? ?Total time: 25 minutes ?

## 2021-06-12 NOTE — TOC Initial Note (Signed)
Transition of Care (TOC) - Initial/Assessment Note  ? ? ?Patient Details  ?Name: Cassandra Andrade ?MRN: [id number] ?Date of Birth: 1965-08-28 ? ?Transition of Care (TOC) CM/SW Contact:    ?Pollie Friar, RN ?Phone Number: ?06/12/2021, 1:06 PM ? ?Clinical Narrative:                 ?Patient is from home with her mother. CM called and spoke to patient's mother about d/c plans. Mother plans on her returning home. She says she has had [facility] for home therapy in the past. She isnt sure they will see for therapy as they have told her the patient was too "weak" for therapy. CM has left voicemail for [facility].  ?Mother interested in getting home care givers arranged again. She states they had them in the past but not currently. CM will fill out the PCS forms for [facility] and fax them in.  ?Pt has wheelchair and hospital bed at home. CM inquired about recommended hoyer lift. Mother states she is interested in this equipment. CM has order the hoyer through [facility]. This will be delivered to the home.  ?CM inquired with the patient's mother about outpatient palliative care services. She has no preference on the agency. CM has sent the referral to [facility]. They will call the patients mother and see her at home. Information on the AVS.  ?TOC following. ? ?Expected Discharge Plan: [place] ?Barriers to Discharge: Continued Medical Work up ? ? ?Patient Goals and CMS Choice ?  ?CMS Medicare.gov Compare Post Acute Care list provided to:: Patient Represenative (must comment) ?Choice offered to / list presented to : Parent (Mom) ? ?Expected Discharge Plan and Services ?Expected Discharge Plan: [place] ?  ?Discharge Planning Services: CM Consult ?Post Acute Care Choice: Home Health, Durable Medical Equipment ?Living arrangements for the past 2 months: [place] ?                ?DME Arranged: Other see comment [facility] lift) ?  ?  ?  ?  ?HH Arranged: PT, OT, Social  Work ?[place] Agency: [place] [facility] ?  ?  ?  ? ?Prior Living Arrangements/Services ?Living arrangements for the past 2 months: [place] ?Lives with:: Parents ?Patient language and need for interpreter reviewed:: Yes ?       ?Need for Family Participation in Patient Care: Yes (Comment) ?Care giver support system in place?: Yes (comment) ?Current home services: DME (hospital bed/ wheelchair) ?Criminal Activity/Legal Involvement Pertinent to Current Situation/Hospitalization: No - Comment as needed ? ?Activities of Daily Living ?  ?  ? ?Permission Sought/Granted ?  ?  ?   ?   ?   ?   ? ?Emotional Assessment ?  ?  ?  ?  ?  ?Psych Involvement: No (comment) ? ?Admission diagnosis:  Hypernatremia [E87.0] ?Supratherapeutic INR [R79.1] ?Altered mental status, unspecified altered mental status type [R41.82] ?Acute metabolic encephalopathy [D53.29] ?Sepsis with acute renal failure without septic shock, due to unspecified organism, unspecified acute renal failure type ([place]) [A41.9, R65.20, N17.9] ?Seizure ([place]) [R56.9] ?Patient Active Problem List  ? Diagnosis Date Noted  ? Protein-calorie malnutrition, severe 06/11/2021  ? Elevated CK 06/10/2021  ? AKI (acute kidney injury) ([place]) 06/10/2021  ? Aspiration pneumonia ([place]) 06/10/2021  ? Constipation 06/10/2021  ? Acute metabolic encephalopathy 92/42/6834  ? Sepsis ([place]) 06/09/2021  ? Supratherapeutic INR 06/09/2021  ? Hypernatremia 06/09/2021  ? Dehydration 06/09/2021  ? UTI (  urinary tract infection) 06/09/2021  ? History of deep venous thrombosis 05/05/2015  ? Personal history of osteosarcoma 05/05/2015  ? Anemia 11/04/2014  ? Abnormal finding on mammography 05/18/2014  ? Major depression, recurrent ([place]) 03/19/2014  ? Vision disturbance 02/28/2014  ? Postprocedural state 12/31/2013  ? History of biliary T-tube placement 12/31/2013  ? Lupus anticoagulant disorder ([place]) 12/20/2013  ? Back pain, chronic 11/07/2013  ? Absence of bladder continence 08/05/2013   ? History of anticoagulant therapy 07/17/2013  ? Long term current use of anticoagulant therapy 07/17/2013  ? Partial epilepsy with impairment of consciousness ([place]) 06/21/2013  ? 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 Absolute anemia 02/28/2013  ? Seizures ([place]) 02/21/2013  ? Seizure ([place]) 02/17/2013  ? DVT, bilateral lower limbs ([place]) 11/07/2012  ? Hypothyroid 07/04/2012  ? Seizure disorder ([place]) 07/04/2012  ? Chronic pain syndrome 07/04/2012  ? S/P BKA (below knee amputation) ([place]) 07/04/2012  ? Chronic pain associated with significant psychosocial dysfunction 07/04/2012  ? Cerebrovascular accident, late effects 06/05/2012  ? 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 Mechanical complication of graft of bone, cartilage, muscle or tendon 05/10/2011  ? Depression 05/03/2011  ? 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 ?PCP:  Berkley Harvey, NP ?Pharmacy:   ?[id number] [address]?[id number]?Phone: [phone]: [phone]? ? ? ? ?Social Determinants of Health (SDOH) Interventions ?  ? ?Readmission Risk Interventions ?   ? View : No data to display.  ?  ?  ?  ? ? ? ?

## 2021-06-12 NOTE — Progress Notes (Signed)
SLP Cancellation Note ? ?Patient Details ?Name: Cassandra Andrade ?MRN: [id number] ?DOB: 12/17/1965 ? ? ?Cancelled treatment:        Soke with RN who reports pt has had decreased alertness affecting PO intake.  Pt is tolerated current texture when awake, but has difficulty maintaining wakefulness for POs.  SLP to follow for trails of advanced textures when more alert. ? ? ?Elonda Giuliano E Lee-Ann Gal, MA, CCC-SLP ?Acute Rehabilitation Services ?Office: [phone] ?06/12/2021, 10:48 AM ?

## 2021-06-12 NOTE — Progress Notes (Signed)
ANTICOAGULATION CONSULT NOTE ? ?Pharmacy Consult for Warfarin ?Indication: lupus anticoagulant disorder, history of DVT ? ?Allergies  ?Allergen Reactions  ? Vicodin [Hydrocodone-Acetaminophen] Nausea And Vomiting  ? Morphine Rash  ? Morphine And Related Rash  ?  Allergic reaction only in iv form  ? ? ?Patient Measurements: ?  ?Vital Signs: ?Temp: 98.5 ?F (36.9 ?C) (04/21 1206) ?Temp Source: Oral (04/21 1206) ?BP: 109/76 (04/21 1206) ?Pulse Rate: 104 (04/21 1206) ? ?Labs: ?Recent Labs  ?  06/09/21 ?2216 06/10/21 ?9935 06/10/21 ?1655 06/11/21 ?0250 06/11/21 ?1007 06/12/21 ?7017  ?HGB 11.9* 11.0*  --  9.9*  --  10.4*  ?HCT 38.5 36.0  --  32.4*  --  32.7*  ?PLT 229 205  --  199  --  214  ?APTT 86* 77*  --   --   --   --   ?LABPROT 64.1* 41.0*  --   --  31.7* 36.5*  ?INR 7.6* 4.3*  --   --  3.1* 3.7*  ?CREATININE 1.90* 1.40* 1.04* 1.05*  --  0.70  ?CKTOTAL 1,211* 1,579* 1,367* 1,454*  --  727*  ? ? ?Estimated Creatinine Clearance: 85 mL/min (by C-G formula based on SCr of 0.7 mg/dL). ? ? ?PTA Anticoagulation Regimen:  ?- warfarin 10 mg daily except 7.5 mg Sun/Wed ? ?Assessment: ?56 y.o. female with medical history significant for history of DVT and lupus anticoagulant on warfarin PRA who presented to hospital with acute confusion and mental status. Pharmacy consulted to manage warfarin. ? ?Current INR above goal at 3.7. CBC stable, platelets 214. Will hold on resuming warfarin until INR within therapeutic range. ? ? ?Goal of Therapy:  ?INR 2-3 ?Monitor platelets by anticoagulation protocol: Yes ?  ?Plan:  ?Hold warfarin dose for today ?Check INR daily while on warfarin ?Continue to monitor H&H and platelets ? ? ? ?Thank you for allowing pharmacy to be a part of this patient?s care. ? ?Ardyth Harps, PharmD ?Clinical Pharmacist ? ? ? ?

## 2021-06-12 NOTE — Procedures (Addendum)
Patient Name: Cassandra Andrade  ?MRN: [id number]  ?Epilepsy Attending: Lora Havens  ?Referring Physician/Provider: Amie Portland, MD ?Duration: 06/11/2021 1115 to 06/12/2021 1115 ?  ?Patient history:  56 year old female with a history of hemorrhagic stroke and seizures who presents with altered mental status. EEG to evaluate for seizure.  ?   ?Level of alertness: Awake, asleep ?  ?AEDs during EEG study:  BRV, LTG, LCM ?  ?Technical aspects: This EEG study was done with scalp electrodes positioned according to the 10-20 International system of electrode placement. Electrical activity was acquired at a sampling rate of '500Hz'$  and reviewed with a high frequency filter of '70Hz'$  and a low frequency filter of '1Hz'$ . EEG data were recorded continuously and digitally stored.  ?  ?Description: No clear posterior dominant rhythm was seen. Sleep was characterized by sleep spindles (12 to 14 Hz), maximum frontocentral region.  EEG showed continuous generalized 5 to 8 Hz theta-alpha activity admixed with 2 to 3 Hz delta slowing. There is also sharply contoured 3 to 5 Hz theta-delta slowing in right frontal region, maximal F8 consistent with breach artifact. Hyperventilation and photic stimulation were not performed.    ?  ?ABNORMALITY ?- Continuous slow, generalized ?- Breach artifact, right frontal region ?  ?IMPRESSION: ?This EEG is suggestive of cortical dysfunction in right frontal consistent with prior craniotomy.  Additionally there is moderate diffuse encephalopathy, nonspecific to etiology.  No seizures were seen throughout the recording. ?  ?Lora Havens  ?

## 2021-06-13 ENCOUNTER — Inpatient Hospital Stay (HOSPITAL_COMMUNITY): Payer: Medicare Other

## 2021-06-13 DIAGNOSIS — I6990 Unspecified sequelae of unspecified cerebrovascular disease: Secondary | ICD-10-CM | POA: Diagnosis not present

## 2021-06-13 DIAGNOSIS — R4182 Altered mental status, unspecified: Secondary | ICD-10-CM | POA: Diagnosis not present

## 2021-06-13 DIAGNOSIS — R569 Unspecified convulsions: Secondary | ICD-10-CM | POA: Diagnosis not present

## 2021-06-13 DIAGNOSIS — N179 Acute kidney failure, unspecified: Secondary | ICD-10-CM | POA: Diagnosis not present

## 2021-06-13 DIAGNOSIS — J690 Pneumonitis due to inhalation of food and vomit: Secondary | ICD-10-CM | POA: Diagnosis not present

## 2021-06-13 DIAGNOSIS — G9341 Metabolic encephalopathy: Secondary | ICD-10-CM | POA: Diagnosis not present

## 2021-06-13 LAB — CBC
HCT: 29 % — ABNORMAL LOW (ref 36.0–46.0)
Hemoglobin: 9.6 g/dL — ABNORMAL LOW (ref 12.0–15.0)
MCH: 28.9 pg (ref 26.0–34.0)
MCHC: 33.1 g/dL (ref 30.0–36.0)
MCV: 87.3 fL (ref 80.0–100.0)
Platelets: 237 10*3/uL (ref 150–400)
RBC: 3.32 MIL/uL — ABNORMAL LOW (ref 3.87–5.11)
RDW: 12.9 % (ref 11.5–15.5)
WBC: 5.6 10*3/uL (ref 4.0–10.5)
nRBC: 0 % (ref 0.0–0.2)

## 2021-06-13 LAB — BASIC METABOLIC PANEL
Anion gap: 9 (ref 5–15)
BUN: <5 mg/dL — ABNORMAL LOW (ref 6–20)
CO2: 28 mmol/L (ref 22–32)
Calcium: 8.4 mg/dL — ABNORMAL LOW (ref 8.9–10.3)
Chloride: 102 mmol/L (ref 98–111)
Creatinine, Ser: 0.9 mg/dL (ref 0.44–1.00)
GFR, Estimated: >60 mL/min (ref >60)
Glucose, Bld: 147 mg/dL — ABNORMAL HIGH (ref 70–99)
Potassium: 3.2 mmol/L — ABNORMAL LOW (ref 3.5–5.1)
Sodium: 139 mmol/L (ref 135–145)

## 2021-06-13 LAB — PROTIME-INR
INR: 3.7 — ABNORMAL HIGH (ref 0.8–1.2)
Prothrombin Time: 36.1 seconds — ABNORMAL HIGH (ref 11.4–15.2)

## 2021-06-13 LAB — MAGNESIUM: Magnesium: 1.8 mg/dL (ref 1.7–2.4)

## 2021-06-13 MED ORDER — POTASSIUM CHLORIDE 10 MEQ/100ML IV SOLN
10.0000 meq | INTRAVENOUS | Status: AC
  Administered 2021-06-13 (×6): 10 meq via INTRAVENOUS
  Filled 2021-06-13 (×6): qty 100

## 2021-06-13 NOTE — Progress Notes (Signed)
Speech Language Pathology Treatment: Dysphagia  ?Patient Details ?Name: Cassandra Andrade ?MRN: [id number] ?DOB: [birth year] ?Today's Date: 06/13/2021 ?Time: 1640-1700 ?SLP Time Calculation (min) (ACUTE ONLY): 20 min ? ?Assessment / Plan / Recommendation ?Clinical Impression ? Patient seen by SLP for skilled treatment session focused on dysphagia goals. Patient awake, alert and in bed when SLP arrived. Her meal tray had just arrived and she was agreeable to eating. SLP doffed one of her restraint mitts and she was able to hold a cup and drink from a straw but with frequent cues for positioning straw at lips. SLP fed patient some of her mashed potatoes and pureed meats but after a couple of bites she said "its nasty". She did enjoy the chocolate magic cup and she ate almost entire container. One instance of delayed throat clearing but otherwise she appears to be tolerating current diet well.  SLP will continue to follow for toleration. ? ?  ?HPI HPI: Per MD "56 year old Female.  Past medical history of stroke, seizures, SAH, DVT/PE (warfarin presented with altered mental status. had a medication change 1 week ago.  Pt seen by neurology and diagnosed with encephalopathy. 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 There is partial consolidative  changes of the right lower lobe consistent with atelectasis or  pneumonia. 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  ?   ?SLP Plan ? Continue with current plan of care ? ?  ?  ?Recommendations for follow up therapy are one component of a multi-disciplinary discharge planning process, led by the attending physician.  Recommendations may be updated based on patient status, additional functional criteria  and insurance authorization. ?  ? ?Recommendations  ?Diet recommendations: Dysphagia 1 (puree);Thin liquid ?Liquids provided via: Cup;Straw ?Medication Administration: Crushed with puree ?Supervision: Full supervision/cueing for compensatory strategies;Trained caregiver to feed patient;Staff to assist with self feeding ?Compensations: Slow rate;Small sips/bites;Minimize environmental distractions ?Postural Changes and/or Swallow Maneuvers: Seated upright 90 degrees  ?   ?    ?   ? ? ? ? Oral Care Recommendations: Oral care BID ?Follow Up Recommendations: Follow physician's recommendations for discharge plan and follow up therapies ?Assistance recommended at discharge: Frequent or constant Supervision/Assistance ?SLP Visit Diagnosis: Dysphagia, oropharyngeal phase (R13.12) ?Plan: Continue with current plan of care ? ? ? ? ?  ?  ? ? ?Sonia Baller, MA, CCC-SLP ?Speech Therapy ? ?

## 2021-06-13 NOTE — Progress Notes (Signed)
ANTICOAGULATION CONSULT NOTE ? ?Pharmacy Consult for Warfarin ?Indication: lupus anticoagulant disorder, history of DVT ? ?Allergies  ?Allergen Reactions  ? Vicodin [Hydrocodone-Acetaminophen] Nausea And Vomiting  ? Morphine Rash  ? Morphine And Related Rash  ?  Allergic reaction only in iv form  ? ? ?Patient Measurements: ?Weight: 76.6 kg (168 lb 14 oz) ?Vital Signs: ?Temp: 97.9 ?F (36.6 ?C) (04/22 1761) ?Temp Source: Oral (04/22 6073) ?BP: 108/55 (04/22 0452) ?Pulse Rate: 92 (04/22 0452) ? ?Labs: ?Recent Labs  ?  06/10/21 ?1655 06/10/21 ?1655 06/11/21 ?0250 06/11/21 ?1007 06/12/21 ?7106 06/13/21 ?0106  ?HGB  --    < > 9.9*  --  10.4* 9.6*  ?HCT  --   --  32.4*  --  32.7* 29.0*  ?PLT  --   --  199  --  214 237  ?LABPROT  --   --   --  31.7* 36.5* 36.1*  ?INR  --   --   --  3.1* 3.7* 3.7*  ?CREATININE 1.04*  --  1.05*  --  0.70 0.90  ?CKTOTAL 1,367*  --  1,454*  --  727*  --   ? < > = values in this interval not displayed.  ? ? ? ?Estimated Creatinine Clearance: 74.7 mL/min (by C-G formula based on SCr of 0.9 mg/dL). ? ? ?PTA Anticoagulation Regimen:  ?- warfarin 10 mg daily except 7.5 mg Sun/Wed ? ?Assessment: ?56 y.o. female with medical history significant for history of DVT and lupus anticoagulant on warfarin PRA who presented to hospital with acute confusion and mental status. Pharmacy consulted to manage warfarin. ? ?Current INR above goal at 3.7. CBC stable, platelets 214. Will hold on resuming warfarin until INR within therapeutic range. ? ? ?Goal of Therapy:  ?INR 2-3 ?Monitor platelets by anticoagulation protocol: Yes ?  ?Plan:  ?Hold warfarin dose for today ?Check INR daily while on warfarin ?Continue to monitor H&H and platelets ? ? ? ?Thank you for allowing pharmacy to be a part of this patient?s care. ? ?Cephus Slater, PharmD, MBA ?Pharmacy Resident ?([phone] ?06/13/2021 7:23 AM ? ? ? ? ?

## 2021-06-13 NOTE — Progress Notes (Signed)
?PROGRESS NOTE ? ? ? ?Cassandra Andrade  WNI:[id number] DOB: 06-12-1965 DOA: 06/09/2021 ?PCP: Berkley Harvey, NP  ? ? ?Brief Narrative:  ?Patient is a 56 y.o. 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  In the ED, patient was noted to be hypernatremic with acute kidney injury and UTI.  Patient was also seen by telemetry neurology who made changes to seizure medication regimen.  Patient was then considered for admission to the hospital. ? ?Assessment and Plan: ?Principal Problem: ?  Acute metabolic encephalopathy ?Active Problems: ?  Sepsis ([place]) ?  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  UTI (urinary tract infection) ?  Hypothyroid ?  AKI (acute kidney injury) ([place]) ?  Depression ?  DVT, bilateral lower limbs ([place]) ?  Supratherapeutic INR ?  Elevated CK ?  Dehydration ?  Hypernatremia ?  S/P BKA (below knee amputation) ([place]) ?  Seizure ([place]) ?  Lupus anticoagulant disorder ([place]) ?  Cerebrovascular accident, late effects ?  Personal history of osteosarcoma ?  Aspiration pneumonia ([place]) ?  Constipation ?  Protein-calorie malnutrition, severe ?  ?Acute metabolic encephalopathy ?Likely secondary to UTI, dehydration and hypernatremia.   Neurology on board.  On continuous EEG monitoring.  EEG reported today shows evidence of epileptogenicity arising from the right frontocentral region.  He is currently on IV briviact '100mg'$  BID,  lamotrignine '350mg'$  BID, and vimpat '200mg'$  bid.  Will follow neurology recommendations.  MRI of the brain has been ordered but pending.  Patient is mildly alert awake and communicative but still encephalopathic. ? ?Rule out seizures and  status epilepticus.  Received continuous EEG.  On brivaracetam, Vimpat.  Continue to hold venlafaxine and olanzapine.   Follow MRI.  Neurology on board.  Follow recommendations ? ?Sepsis ([place]) secondary to UTI. ?Patient had a fever with tachycardia and urinalysis showed  UTI.  On empiric IV Unasyn.  Urine culture showing multiple species at this time.  ?  ?S/P BKA (below knee amputation) ([place]) ?Chronic stable ?  ?Hypothyroidism ?On IV Synthroid at this time.  Resume oral when mental status improves. On dysphagia 1 diet at this time. ?   ?DVT, bilateral lower limbs ([place]) ?-Presented with supratherapeutic INR.  On Coumadin as outpatient..  INR of 3.7 today.  Pharmacy to manage.  ?  ?Lupus anticoagulant disorder ([place]) ?On Coumadin as outpatient.  Currently on hold due to supratherapeutic INR.  Pharmacy to manage.  Latest INR of 3.7 ?  ?Personal history of osteosarcoma ?Status post right BKA. ?  ?Supratherapeutic INR ?Patient presented with INR of 7.6.  Coumadin on hold.  Received 1 dose of vitamin K on admission.  Latest INR of 3.1 pharmacy to manage ?  ?Hypernatremia ?On D5 water.  Sodium level has improved to 139 this morning.  Continue to monitor. ? ?Mild hypokalemia.  Received IV potassium supplement yesterday.  Potassium 3.2 today.  We will continue to replenish.  Check BMP in AM. ? ?Dehydration ?Patient with decreased oral intake at home.  On D5 water. ?  ?Elevated CK, likely mild rhabdomyolysis. ?Will continue to rehydrate.    CK level today at 727 improved from 1454. ?  ?AKI (acute kidney injury) ([place]) ?Likely  secondary to volume depletion.  Creatinine today at 0.9 ? ?Depression ?On Effexor, Zyprexa at home.   Resume when appropriate.  Currently on hold. ?  ?Intractable epilepsy ([place]) ?Neurology on board.  Currently on IV AED. ?  ?Aspiration pneumonia ([place]) ?Likely secondary to metabolic encephalopathy.  On Unasyn.    Speech therapy on board and recommend dysphagia 1 diet at this time ? ?Severe protein calorie  malnutrition. ?Present on admission.  Seen by dietitian. ? ?Goals of care.  Palliative care on board and plan is to continue with full scope of treatment. ?   ? ? DVT prophylaxis: SCDs Start: 06/10/21 0036 ? ? ?Code Status:   ?  Code Status: Full Code ? ?Disposition: Home with home Health. ? ?Status is: Inpatient ? ?Remains inpatient appropriate because: continuous EEG, metabolic encephalopathy, deconditioning debility, IV AED ? ? Family Communication: ?Spoke with the patient's mother on the phone on 06/12/2021 ? ?Consultants:  ?Neurology ?Palliative care ? ?Procedures:  ?Continuous EEG ? ?Antimicrobials:  ?Unasyn 4/19> ? ?Anti-infectives (From admission, onward)  ? ? Start     Dose/Rate Route Frequency Ordered Stop  ? 06/10/21 2300  cefTRIAXone (ROCEPHIN) 2 g in sodium chloride 0.9 % 100 mL IVPB  Status:  Discontinued       ? 2 g ?200 mL/hr over 30 Minutes Intravenous Every 24 hours 06/09/21 2325 06/10/21 0123  ? 06/10/21 1600  Ampicillin-Sulbactam (UNASYN) 3 g in sodium chloride 0.9 % 100 mL IVPB       ? 3 g ?200 mL/hr over 30 Minutes Intravenous Every 6 hours 06/10/21 0953    ? 06/10/21 0200  Ampicillin-Sulbactam (UNASYN) 3 g in sodium chloride 0.9 % 100 mL IVPB  Status:  Discontinued       ? 3 g ?200 mL/hr over 30 Minutes Intravenous Every 8 hours 06/10/21 0129 06/10/21 0953  ? 06/09/21 2300  cefTRIAXone (ROCEPHIN) 1 g in sodium chloride 0.9 % 100 mL IVPB       ? 1 g ?200 mL/hr over 30 Minutes Intravenous  Once 06/09/21 2247 06/09/21 2329  ? ?  ? ?Subjective: ?Today, patient was seen and examined at bedside.  Still has mumbled speech but able to comprehend questions.  Oriented to self and place.  Moves extremities on commands.   ? ?Objective: ?Vitals:  ? 06/12/21 1957 06/12/21 2337 06/13/21 3086 06/13/21 0752  ?BP: 120/64 119/65 (!) 108/55 (!) 118/105  ?Pulse: 100 95 92 93  ?Resp: '18 15 16 15  '$ ?Temp: 99.4 ?F (37.4 ?C) 98.6 ?F (37 ?C) 97.9 ?F (36.6 ?C) 98.2 ?F (36.8 ?C)  ?TempSrc: Oral Oral Oral Oral  ?SpO2:     100%  ?Weight:   76.6 kg   ? ? ?Intake/Output Summary (Last 24 hours) at 06/13/2021 1102 ?Last data filed at 06/13/2021 0454 ?Gross per 24 hour  ?Intake 1945.88 ml  ?Output 1550 ml  ?Net 395.88 ml  ? ?Filed Weights  ? 06/13/21 0452  ?Weight: 76.6 kg  ? ? ?Physical Examination: ? ?General:  Average built, not in obvious distress, EEG electrodes in place. ?HENT:   No scleral pallor or icterus noted. Oral mucosa is moist.  ?Chest:  Clear breath sounds.  Diminished breath sounds bilaterally. No crackles or wheezes.  ?CVS: S1 &S2 heard. No murmur.  Regular rate and rhythm. ?Abdomen: Soft, nontender, nondistended.  Bowel sounds are heard.   ?Extremities: Right above-knee amputation. ?Psych: Alert, awake and oriented to self and place, normal mood speech is still mumbled but better. ?CNS:  No cranial nerve deficits.  Moves extremities on commands. ?Skin: Warm and dry.  No rashes noted. ? ?Data Reviewed:  ? ?CBC: ?Recent Labs  ?Lab 06/09/21 ?2216 06/10/21 ?2671 06/11/21 ?0250 06/12/21 ?2458 06/13/21 ?0106  ?WBC 9.0 9.0 7.4 5.8 5.6  ?NEUTROABS 7.2  --   --   --   --   ?HGB 11.9* 11.0* 9.9* 10.4* 9.6*  ?HCT 38.5 36.0 32.4* 32.7* 29.0*  ?MCV 92.3 92.5 92.0 87.9 87.3  ?PLT 229 205 199 214 237  ? ? ?Basic Metabolic Panel: ?Recent Labs  ?Lab 06/09/21 ?2216 06/10/21 ?0998 06/10/21 ?1655 06/11/21 ?0250 06/12/21 ?3382 06/13/21 ?0106  ?NA 151* 150* 143 145 138 139  ?K 4.0 3.9 3.5 3.3* 3.0* 3.2*  ?CL 115* 119* 112* 112* 102 102  ?CO2 '25 25 24 26 28 28  '$ ?GLUCOSE 132* 127* 288* 122* 138* 147*  ?BUN 29* 24* 17 12 5* <5*  ?CREATININE 1.90* 1.40* 1.04* 1.05* 0.70 0.90  ?CALCIUM 9.1 8.1* 7.6* 8.3* 8.3* 8.4*  ?MG 2.2 2.0  --   --  1.8 1.8  ?PHOS 4.5 3.1  --   --   --   --   ? ? ?Liver Function Tests: ?Recent Labs  ?Lab 06/09/21 ?2216 06/10/21 ?5053  ?AST 33 36  ?ALT 17 17  ?ALKPHOS 82 74  ?BILITOT 0.6 0.4  ?PROT 7.7 6.7  ?ALBUMIN 3.6 3.1*  ? ? ? ?Radiology Studies: ?EEG adult ? ?Result Date: 06/11/2021 ?Lora Havens, MD     06/11/2021 12:20  PM Patient Name: Cassandra Andrade MRN: [id number] Epilepsy Attending: Lora Havens Referring Physician/Provider: Amie Portland, MD Date: 06/11/2021 Duration: 26.26 mins Patient history:  56 year old female wi

## 2021-06-13 NOTE — Progress Notes (Signed)
Pharmacy Antibiotic Note ? ?Cassandra Andrade is a 56 y.o. female admitted on 06/09/2021 with sepsis, AMS, aspiration pneumonia.  Pharmacy has been consulted for Unasyn dosing. ? 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 Patient remains afebrile and WBC remain wnl. Scr has improved since admission to wnl. Ucx showing multiple species and Bcx growing gram pos coccii identified as staph epi, potentially contaminant.  ? ?Plan: ?Unasyn 3gm IV q6h ?Monitor renal fxn and clinical status ?Follow culture results and sensitivities ?Monitor for length of therapy  ? ?Weight: 76.6 kg (168 lb 14 oz) ? ?Temp (24hrs), Avg:98.6 ?F (37 ?C), Min:97.9 ?F (36.6 ?C), Max:99.4 ?F (37.4 ?C) ? ?Recent Labs  ?Lab 06/09/21 ?2216 06/10/21 ?8413 06/10/21 ?1655 06/11/21 ?0250 06/12/21 ?2440 06/13/21 ?0106  ?WBC 9.0 9.0  --  7.4 5.8 5.6  ?CREATININE 1.90* 1.40* 1.04* 1.05* 0.70 0.90  ?LATICACIDVEN 1.6 1.0  --   --   --   --   ? ?  ?Estimated Creatinine Clearance: 74.7 mL/min (by C-G formula based on SCr of 0.9 mg/dL).   ? ?Allergies  ?Allergen Reactions  ? Vicodin [Hydrocodone-Acetaminophen] Nausea And Vomiting  ? Morphine Rash  ? Morphine And Related Rash  ?  Allergic reaction only in iv form  ? ? ?Antimicrobials this admission: ?4/18 Ceftriaxone x 1 ?4/19 Unasyn >>   ? ?Dose adjustments this admission: ? Unasyn 3g Q8H >> Unasyn 3g Q6H (renal dose adjustment)  ? ?Microbiology results: ?4/18 BCx:  staph epi ?4/18 UCx:  multiple species  ?4/20 Ucx: ngtd  ?  ? ?Thank you for allowing pharmacy to be a part of this patient?s care. ? ?Cephus Slater, PharmD, MBA ?Pharmacy Resident ?[phone] ?06/13/2021 7:37 AM ? ? ?

## 2021-06-13 NOTE — Care Management (Signed)
Notified by Jolyn Nap RN CM that [facility] has agreed to service for home health.  ?

## 2021-06-13 NOTE — Procedures (Addendum)
Patient Name: Cassandra Andrade  ?MRN: [id number]  ?Epilepsy Attending: Lora Havens  ?Referring Physician/Provider: Amie Portland, MD ?Duration: 06/12/2021 1115 to 06/13/2021 1115 ?  ?Patient history:  56 year old female with a history of hemorrhagic stroke and seizures who presents with altered mental status. EEG to evaluate for seizure.  ?   ?Level of alertness: Awake, asleep ?  ?AEDs during EEG study:  BRV, LTG, LCM ?  ?Technical aspects: This EEG study was done with scalp electrodes positioned according to the 10-20 International system of electrode placement. Electrical activity was acquired at a sampling rate of '500Hz'$  and reviewed with a high frequency filter of '70Hz'$  and a low frequency filter of '1Hz'$ . EEG data were recorded continuously and digitally stored.  ?  ?Description: No clear posterior dominant rhythm was seen. Sleep was characterized by sleep spindles (12 to 14 Hz), maximum frontocentral region.  EEG showed continuous generalized 5 to 8 Hz theta-alpha activity admixed with 2 to 3 Hz delta slowing. There is also sharply contoured 3 to 5 Hz theta-delta slowing in right frontal region, maximal F8 consistent with breach artifact. Spikes were noted in right fronto-central region. Hyperventilation and photic stimulation were not performed.    ?  ?ABNORMALITY ?- Spike right frontocentral region ?- Continuous slow, generalized ?- Breach artifact, right frontal region ?  ?IMPRESSION: ?This EEG showed evidence of epileptogenicity arising from right frontocentral region. The study is also suggestive of cortical dysfunction in right frontal consistent with prior craniotomy.  Additionally there is moderate diffuse encephalopathy, nonspecific to etiology.  No seizures were seen throughout the recording. ?  ?Lora Havens  ? ?

## 2021-06-13 NOTE — Progress Notes (Addendum)
Neurology Progress Note ? ? ?S:// ?She is lethargic, awakens easily, can state her name. No family at bedside. LTM in place ? ? ?O:// ?Current vital signs: ?BP (!) 118/105   Pulse 93   Temp 97.9 ?F (36.6 ?C) (Oral)   Resp 16   Wt 76.6 kg   SpO2 100%   BMI 27.26 kg/m?  ?Vital signs in last 24 hours: ?Temp:  [97.9 ?F (36.6 ?C)-99.4 ?F (37.4 ?C)] 97.9 ?F (36.6 ?C) (04/22 7829) ?Pulse Rate:  [92-104] 93 (04/22 0752) ?Resp:  [15-20] 16 (04/22 0452) ?BP: (108-130)/(55-105) 118/105 (04/22 0752) ?SpO2:  [100 %] 100 % (04/21 1510) ?Weight:  [76.6 kg] 76.6 kg (04/22 0452) ? ?General: lethargic in NAD ?HEENT: Normocephalic atraumatic. edentulous ?Lungs: Clear ?Cardiovascular: Regular rhythm ?Abdomen nondistended nontender ? ?Neurological exam ?Awakens easily to voice x 1. ?Speech is dysarthric and at times incomprehensible. Follows commands ?Cranial nerves: Pupils are equal round reactive to light, she is able to move her eyes to both directions spontaneously  Face is symmetric. Has a hard time with upward gaze ?Motor examination: Raises both upper extremities antigravity.  LEft leg wiggles toes, on passive movement of left leg she cried out in pain, moves left leg horizontally. Lifts R BKA stump.  ?Coordination difficult to assess  ? ?Medications ? ?Current Facility-Administered Medications:  ?  acetaminophen (TYLENOL) tablet 650 mg, 650 mg, Oral, Q6H PRN **OR** acetaminophen (TYLENOL) suppository 650 mg, 650 mg, Rectal, Q6H PRN, Doutova, Anastassia, MD ?  Ampicillin-Sulbactam (UNASYN) 3 g in sodium chloride 0.9 % 100 mL IVPB, 3 g, Intravenous, Q6H, Adrian Saran, RPH, Last Rate: 200 mL/hr at 06/13/21 0532, 3 g at 06/13/21 0532 ?  bisacodyl (DULCOLAX) suppository 10 mg, 10 mg, Rectal, Daily PRN, Doutova, Anastassia, MD ?  brivaracetam (BRIVIACT) 100 mg in sodium chloride 0.9 % 50 mL, 100 mg, Intravenous, Q12H, Plancher, Baron Sane, MD, Stopped at 06/12/21 2314 ?  dextrose 5 % solution, , Intravenous, Continuous, Donne Hazel, MD, Last Rate: 75 mL/hr at 06/13/21 0414, New Bag at 06/13/21 0414 ?  feeding supplement (ENSURE ENLIVE / ENSURE PLUS) liquid 237 mL, 237 mL, Oral, BID BM, Pokhrel, Laxman, MD, 237 mL at 06/12/21 1354 ?  lacosamide (VIMPAT) 200 mg in sodium chloride 0.9 % 25 mL IVPB, 200 mg, Intravenous, Q12H, Plancher, Baron Sane, MD, Stopped at 06/12/21 2328 ?  lamoTRIgine (LAMICTAL) tablet 350 mg, 350 mg, Oral, BID, Plancher, Baron Sane, MD, 350 mg at 06/12/21 2252 ?  [START ON 06/17/2021] levothyroxine (SYNTHROID, LEVOTHROID) injection 25 mcg, 25 mcg, Intravenous, Daily, Donne Hazel, MD ?  MEDLINE mouth rinse, 15 mL, Mouth Rinse, BID, Pokhrel, Laxman, MD, 15 mL at 06/12/21 2259 ?  multivitamin with minerals tablet 1 tablet, 1 tablet, Oral, Daily, Pokhrel, Laxman, MD, 1 tablet at 06/12/21 1100 ?  potassium chloride 10 mEq in 100 mL IVPB, 10 mEq, Intravenous, Q1 Hr x 6, Pokhrel, Laxman, MD, Last Rate: 100 mL/hr at 06/13/21 0844, 10 mEq at 06/13/21 0844 ?  thiamine (B-1) injection 100 mg, 100 mg, Intravenous, Daily, Doutova, Anastassia, MD, 100 mg at 06/12/21 1100 ?  Warfarin - Pharmacist Dosing Inpatient, , Does not apply, q1600, Pokhrel, Laxman, MD ? ? ?Imaging ?I have reviewed images in epic and the results pertinent to this consultation are: ?CT head negative ? ?LTM EEG 4/21-22 ?ABNORMALITY ?This EEG showed evidence of epileptogenicity arising from right frontocentral region. The study is also suggestive of cortical dysfunction in right frontal consistent with prior craniotomy.  Additionally  there is moderate diffuse encephalopathy, nonspecific to etiology.  No seizures were seen throughout the recording. ?  ?IMPRESSION: ?This EEG is suggestive of cortical dysfunction in right frontal consistent with prior craniotomy.  Additionally there is moderate diffuse encephalopathy, nonspecific to etiology.  No seizures were seen throughout the recording.  ? ?CK 727 down from 1454 ? ?Assessment: 56 year old past history of  hemorrhagic stroke and seizures presented with altered mental status worsening on the hospital.  Continues to be encephalopathic on exam.  Concern for underlying seizures/status of Depakote. ?Hooked up to rapid EEG overnight-no definitive seizures captured on it per preliminary review from the overnight neurologist. ?Plan  to LTM, and obtain MRI. ?Given increased CK on labs and altered mental status-NMS have to be considered as differential as well. ? 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 ?Recommendations: ?- Continue to hold home venlafaxine and olanzapine  ?- Continue brivaracetam 100 twice daily IV ?- Continue Vimpat 200 mg twice daily IV ?- MRI brain when able to -MRI safe leads are on patient. Not awake enough to be able to communicate if the VNS heats up in the MRI scanner.  We will be able to turn off her VNS when she is a little bit more communicative and safe to go on the MRI.  Probably plan for MRI Sunday. ?- Continue hydration per primary team ?- Treat infection per primary  ?-Continue LTM for 1 more day-no seizures seen but exam is still not baseline. ? ?Beulah Gandy DNP, ACNPC-AG ? ? ?Attending Neurohospitalist Addendum ?Patient seen and examined with APP/Resident. ?Agree with the history and physical as documented above. ?Agree with the plan as documented, which I helped formulate. ?I have independently reviewed the chart, obtained history, review of systems and examined the patient.I have personally reviewed pertinent head/neck/spine imaging (CT/MRI). ?Please feel free to call with any questions. ? ?-- ?Amie Portland, MD ?Neurologist ?[facility] ?Pager: [phone] ? ? ? ? ? ? ?

## 2021-06-13 NOTE — Progress Notes (Signed)
Spoke with Dr Armandina Gemma (Radiologist) regarding pt's ordered MRI with VNS device implanted. Device is conditional however, device conditions appears to assume pt alertness. Per conditions, if device malfunctions or causes stimulation or heating during the exam after programing, the pt should recognize/report that and exam would be aborted. Given that the pt is not currently A/O to recognize/report said adverse events, Dr Armandina Gemma (Radiologist) has deemed the pt unsafe for MRI until pt is A/O. ?

## 2021-06-13 NOTE — Progress Notes (Signed)
Maint and skin check complete. ?

## 2021-06-14 DIAGNOSIS — R569 Unspecified convulsions: Secondary | ICD-10-CM | POA: Diagnosis not present

## 2021-06-14 DIAGNOSIS — J690 Pneumonitis due to inhalation of food and vomit: Secondary | ICD-10-CM | POA: Diagnosis not present

## 2021-06-14 DIAGNOSIS — N179 Acute kidney failure, unspecified: Secondary | ICD-10-CM | POA: Diagnosis not present

## 2021-06-14 DIAGNOSIS — G9341 Metabolic encephalopathy: Secondary | ICD-10-CM | POA: Diagnosis not present

## 2021-06-14 DIAGNOSIS — R4182 Altered mental status, unspecified: Secondary | ICD-10-CM | POA: Diagnosis not present

## 2021-06-14 DIAGNOSIS — I6990 Unspecified sequelae of unspecified cerebrovascular disease: Secondary | ICD-10-CM | POA: Diagnosis not present

## 2021-06-14 LAB — BASIC METABOLIC PANEL
Anion gap: 10 (ref 5–15)
BUN: 5 mg/dL — ABNORMAL LOW (ref 6–20)
CO2: 29 mmol/L (ref 22–32)
Calcium: 9 mg/dL (ref 8.9–10.3)
Chloride: 97 mmol/L — ABNORMAL LOW (ref 98–111)
Creatinine, Ser: 0.84 mg/dL (ref 0.44–1.00)
GFR, Estimated: >60 mL/min (ref >60)
Glucose, Bld: 113 mg/dL — ABNORMAL HIGH (ref 70–99)
Potassium: 4.2 mmol/L (ref 3.5–5.1)
Sodium: 136 mmol/L (ref 135–145)

## 2021-06-14 LAB — PROTIME-INR
INR: 4 — ABNORMAL HIGH (ref 0.8–1.2)
Prothrombin Time: 39 seconds — ABNORMAL HIGH (ref 11.4–15.2)

## 2021-06-14 MED ORDER — ONDANSETRON HCL 4 MG/2ML IJ SOLN
4.0000 mg | Freq: Four times a day (QID) | INTRAMUSCULAR | Status: DC | PRN
  Administered 2021-06-14 – 2021-06-17 (×4): 4 mg via INTRAVENOUS
  Filled 2021-06-14 (×4): qty 2

## 2021-06-14 NOTE — Progress Notes (Signed)
ANTICOAGULATION CONSULT NOTE ? ?Pharmacy Consult for Warfarin ?Indication: lupus anticoagulant disorder, history of DVT ? ?Allergies  ?Allergen Reactions  ? Vicodin [Hydrocodone-Acetaminophen] Nausea And Vomiting  ? Morphine Rash  ? Morphine And Related Rash  ?  Allergic reaction only in iv form  ? ? ?Patient Measurements: ?Weight: 76.6 kg (168 lb 14 oz) ?Vital Signs: ?Temp: 98.4 ?F (36.9 ?C) (04/23 0430) ?Temp Source: Oral (04/23 0430) ?BP: 126/71 (04/23 0430) ?Pulse Rate: 94 (04/23 0430) ? ?Labs: ?Recent Labs  ?  06/12/21 ?7902 06/13/21 ?0106 06/14/21 ?4097  ?HGB 10.4* 9.6*  --   ?HCT 32.7* 29.0*  --   ?PLT 214 237  --   ?LABPROT 36.5* 36.1* 39.0*  ?INR 3.7* 3.7* 4.0*  ?CREATININE 0.70 0.90  --   ?CKTOTAL 727*  --   --   ? ? ? ?Estimated Creatinine Clearance: 74.7 mL/min (by C-G formula based on SCr of 0.9 mg/dL). ? ? ?PTA Anticoagulation Regimen:  ?- warfarin 10 mg daily except 7.5 mg Sun/Wed ? ?Assessment: ?56 y.o. female with medical history significant for history of DVT and lupus anticoagulant on warfarin PRA who presented to hospital with acute confusion and mental status. Pharmacy consulted to manage warfarin. ? ?Current INR above goal at 4.0, has trended up since yesterday. Hgb 9.6 trending down slightly, platelets 237. Will hold on resuming warfarin until INR within therapeutic range. ? ? ?Goal of Therapy:  ?INR 2-3 ?Monitor platelets by anticoagulation protocol: Yes ?  ?Plan:  ?Hold warfarin dose for today ?Check INR daily while on warfarin ?Continue to monitor H&H and platelets ? ? ? ?Thank you for allowing pharmacy to be a part of this patient?s care. ? ?Cephus Slater, PharmD, MBA ?Pharmacy Resident ?([phone] ?06/14/2021 8:27 AM ? ? ? ? ?

## 2021-06-14 NOTE — Progress Notes (Signed)
LTM EEG discontinued - Only skin break down was under T4 electrode. RN was notified.  ?

## 2021-06-14 NOTE — Procedures (Addendum)
Patient Name: Cassandra Andrade  ?MRN: [id number]  ?Epilepsy Attending: Lora Havens  ?Referring Physician/Provider: Amie Portland, MD ?Duration: 06/13/2021 1115 to 06/14/2021 1141 ?  ?Patient history:  56 year old female with a history of hemorrhagic stroke and seizures who presents with altered mental status. EEG to evaluate for seizure.  ?   ?Level of alertness: Awake, asleep ?  ?AEDs during EEG study:  BRV, LTG, LCM ?  ?Technical aspects: This EEG study was done with scalp electrodes positioned according to the 10-20 International system of electrode placement. Electrical activity was acquired at a sampling rate of '500Hz'$  and reviewed with a high frequency filter of '70Hz'$  and a low frequency filter of '1Hz'$ . EEG data were recorded continuously and digitally stored.  ?  ?Description: No clear posterior dominant rhythm was seen. Sleep was characterized by sleep spindles (12 to 14 Hz), maximum frontocentral region.  EEG showed continuous generalized 5 to 8 Hz theta-alpha activity admixed with 2 to 3 Hz delta slowing. There is also sharply contoured 3 to 5 Hz theta-delta slowing in right frontal region, maximal F8 consistent with breach artifact. Spikes were noted in right fronto-central region. Hyperventilation and photic stimulation were not performed.   ? ?Patient event button was pressed on 06/13/2021 at 1132 during which patient was crying, not responding to staff. Concomitant EEG before, during and after the event didn't show EEG change to suggest seizure. ? ?Parts of study were difficult to interpret due to electrode artifact.  ?  ?ABNORMALITY ?- Spike right frontocentral region ?- Continuous slow, generalized ?- Breach artifact, right frontal region ?  ?IMPRESSION: ?This EEG showed evidence of epileptogenicity arising from right frontocentral region. The study is also suggestive of cortical dysfunction in right frontal consistent with prior craniotomy.  Additionally there is moderate diffuse encephalopathy,  nonspecific to etiology.  No seizures were seen throughout the recording. ? ?Patient event button was pressed on 06/13/2021 at 1132 during which patient was crying, not responding to staff without concomitant EEG change. This was most likely NOT an epileptic event.  ?  ?Lora Havens  ?

## 2021-06-14 NOTE — Progress Notes (Signed)
Neurology Progress Note ? ? ?S:// ?She is awake laying in bed this am, appears improved from yesterday. She can state her name and follow simple commands.  No family at bedside. LTM in place.  Will attempt to get MRI done today or tomorrow. ? ? ?O:// ?Current vital signs: ?BP 118/63 (BP Location: Left Arm)   Pulse 94   Temp 99.1 ?F (37.3 ?C) (Oral)   Resp 14   Wt 76.6 kg   SpO2 100%   BMI 27.26 kg/m?  ?Vital signs in last 24 hours: ?Temp:  [98 ?F (36.7 ?C)-99.1 ?F (37.3 ?C)] 99.1 ?F (37.3 ?C) (04/23 7846) ?Pulse Rate:  [92-104] 94 (04/23 0852) ?Resp:  [14-20] 14 (04/23 9629) ?BP: (118-164)/(63-82) 118/63 (04/23 5284) ?SpO2:  [100 %] 100 % (04/23 0852) ? ?General: lethargic in NAD ?HEENT: Normocephalic atraumatic. edentulous ?Lungs: Clear ?Cardiovascular: Regular rhythm ?Abdomen nondistended nontender ? ?Neurological exam ?Awakens easily to voice x 1. ?Speech is dysarthric and at times incomprehensible. Follows commands ?Cranial nerves: Pupils are equal round reactive to light, she is able to move her eyes to both directions spontaneously  Face is symmetric although edentulous. Has a hard time with upward gaze ?Motor examination: Raises both upper extremities antigravity.  LEft leg wiggles toes, on passive movement of left leg she cried out in pain, moves left leg horizontally. Lifts R BKA stump.  ?Coordination difficult to assess  ? ?Medications ? ?Current Facility-Administered Medications:  ?  acetaminophen (TYLENOL) tablet 650 mg, 650 mg, Oral, Q6H PRN, 650 mg at 06/13/21 1027 **OR** acetaminophen (TYLENOL) suppository 650 mg, 650 mg, Rectal, Q6H PRN, Doutova, Anastassia, MD ?  Ampicillin-Sulbactam (UNASYN) 3 g in sodium chloride 0.9 % 100 mL IVPB, 3 g, Intravenous, Q6H, Adrian Saran, RPH, Last Rate: 200 mL/hr at 06/14/21 0858, 3 g at 06/14/21 0858 ?  bisacodyl (DULCOLAX) suppository 10 mg, 10 mg, Rectal, Daily PRN, Doutova, Anastassia, MD ?  brivaracetam (BRIVIACT) 100 mg in sodium chloride 0.9 % 50 mL,  100 mg, Intravenous, Q12H, Plancher, Baron Sane, MD, Last Rate: 240 mL/hr at 06/14/21 0948, 100 mg at 06/14/21 0948 ?  dextrose 5 % solution, , Intravenous, Continuous, Donne Hazel, MD, Last Rate: 75 mL/hr at 06/14/21 0536, New Bag at 06/14/21 0536 ?  feeding supplement (ENSURE ENLIVE / ENSURE PLUS) liquid 237 mL, 237 mL, Oral, BID BM, Pokhrel, Laxman, MD, 237 mL at 06/14/21 0900 ?  lacosamide (VIMPAT) 200 mg in sodium chloride 0.9 % 25 mL IVPB, 200 mg, Intravenous, Q12H, Plancher, Baron Sane, MD, Last Rate: 90 mL/hr at 06/13/21 2150, 200 mg at 06/13/21 2150 ?  lamoTRIgine (LAMICTAL) tablet 350 mg, 350 mg, Oral, BID, Plancher, Baron Sane, MD, 350 mg at 06/14/21 0859 ?  [START ON 06/17/2021] levothyroxine (SYNTHROID, LEVOTHROID) injection 25 mcg, 25 mcg, Intravenous, Daily, Donne Hazel, MD ?  MEDLINE mouth rinse, 15 mL, Mouth Rinse, BID, Pokhrel, Laxman, MD, 15 mL at 06/14/21 1324 ?  multivitamin with minerals tablet 1 tablet, 1 tablet, Oral, Daily, Pokhrel, Laxman, MD, 1 tablet at 06/14/21 0859 ?  thiamine (B-1) injection 100 mg, 100 mg, Intravenous, Daily, Doutova, Anastassia, MD, 100 mg at 06/14/21 0858 ?  Warfarin - Pharmacist Dosing Inpatient, , Does not apply, q1600, Pokhrel, Laxman, MD ? ? ?Imaging ?I have reviewed images in epic and the results pertinent to this consultation are: ?CT head negative ? ?LTM EEG 4/21-22 ?ABNORMALITY ?This EEG showed evidence of epileptogenicity arising from right frontocentral region. The study is also suggestive of cortical dysfunction  in right frontal consistent with prior craniotomy.  Additionally there is moderate diffuse encephalopathy, nonspecific to etiology.  No seizures were seen throughout the recording. ? ?LTM EEG 4/22-4/23: ?This EEG showed evidence of epileptogenicity arising from right frontocentral region. The study is also suggestive of cortical dysfunction in right frontal consistent with prior craniotomy.  Additionally there is moderate diffuse encephalopathy,  nonspecific to etiology.  No seizures were seen throughout the recording. ?  ?Patient event button was pressed on 06/13/2021 at 1132 during which patient was crying, not responding to staff without concomitant EEG change. This was most likely NOT an epileptic event.  ?  ?IMPRESSION: ?This EEG is suggestive of cortical dysfunction in right frontal consistent with prior craniotomy.  Additionally there is moderate diffuse encephalopathy, nonspecific to etiology.  No seizures were seen throughout the recording.  ? ?CK 727 down from 1454 ? ?Assessment: 56 year old past history of hemorrhagic stroke and seizures presented with altered mental status worsening on the hospital.  Continues to be encephalopathic on exam.  Concern for underlying seizures/status epilepticus.[name] ?Hooked up to rapid EEG overnight-no definitive seizures captured on it per preliminary review from the overnight neurologist.  LTM over the past couple of days with no seizures. ?Exam possibly not baseline but slowly improving. ?Given increased CK on labs and altered mental status-NMS have to be considered as differential as well. ? 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 ?Recommendations: ?- Continue to hold home venlafaxine and olanzapine  ?- Continue brivaracetam 100 twice daily IV ?- Continue Vimpat 200 mg twice daily IV ?- MRI brain when able to -MRI safe leads are on patient. She is awake enough today to be able to communicate if the VNS heats up in the MRI scanner.  We will be able to turn off her VNS when she is a little bit more communicative and safe to go on the MRI.  Unfortunately we do not have access to the VNS controller and this will have to be done tomorrow.  I have talked to the MRI technologist that she will be able to schedule her for tomorrow morning. ?- Continue hydration per primary team ?- Treat infection per primary  ?-Discontinue LTM ? ?Beulah Gandy DNP,  ACNPC-AG ? ?Attending Neurohospitalist Addendum ?Patient seen and examined with APP/Resident. ?Agree with the history and physical as documented above. ?Agree with the plan as documented, which I helped formulate. ?I have independently reviewed the chart, obtained history, review of systems and examined the patient.I have personally reviewed pertinent head/neck/spine imaging (CT/MRI).  Plan relayed to primary hospitalist via secure chat ?Please feel free to call with any questions. ? ?-- ?Amie Portland, MD ?Neurologist ?[facility] ?Pager: [phone] ? ? ? ?

## 2021-06-14 NOTE — Progress Notes (Signed)
?PROGRESS NOTE ? ? ? ?Cassandra Andrade  FTD:[id number] DOB: 07/28/1965 DOA: 06/09/2021 ?PCP: Berkley Harvey, NP  ? ? ?Brief Narrative:  ?Patient is a 56 y.o. 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  In the ED, patient was noted to be hypernatremic with acute kidney injury and UTI.  Patient was also seen by telemetry neurology who made changes to seizure medication regimen.  Patient was then considered for admission to the hospital. ? ?Assessment and Plan: ?Principal Problem: ?  Acute metabolic encephalopathy ?Active Problems: ?  Sepsis ([place]) ?  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  UTI (urinary tract infection) ?  Hypothyroid ?  AKI (acute kidney injury) ([place]) ?  Depression ?  DVT, bilateral lower limbs ([place]) ?  Supratherapeutic INR ?  Elevated CK ?  Dehydration ?  Hypernatremia ?  S/P BKA (below knee amputation) ([place]) ?  Seizure ([place]) ?  Lupus anticoagulant disorder ([place]) ?  Cerebrovascular accident, late effects ?  Personal history of osteosarcoma ?  Aspiration pneumonia ([place]) ?  Constipation ?  Protein-calorie malnutrition, severe ?  ?Acute metabolic encephalopathy ?Likely secondary to UTI, dehydration and hypernatremia and possible seizures..   Neurology on board.  On continuous EEG monitoring.  EEG reported 06/13/21 shows evidence of epileptogenicity arising from the right frontocentral region.  Currently on IV briviact '100mg'$  BID,  lamotrignine '350mg'$  BID, and vimpat '200mg'$  bid.  Will follow neurology recommendations.  Pending MRI of the brain.  Patient still is mumbling but oriented to place and month. ? ?Rule out seizures and status epilepticus.   Received continuous EEG.  On brivaracetam, Vimpat.  Continue to hold venlafaxine and olanzapine.   Follow MRI brain when able.  Neurology on board, follow recommendations ? ?Sepsis ([place]) secondary to UTI. ?Patient had  fever with tachycardia and urinalysis showed  UTI.  On empiric IV Unasyn.  Urine culture showing multiple species at this time.  We will continue antibiotic to complete the course. ?  ?S/P BKA (below knee amputation) ([place]) ?Chronic stable ?  ?Hypothyroidism ?On IV Synthroid at this time.  Resume oral when mental status improves. On dysphagia 1 diet at this time. ?   ?DVT, bilateral lower limbs ([place]) ?-Presented with supratherapeutic INR.  On Coumadin as outpatient..  INR of 4.0 today.  Pharmacy to manage.  ?  ?Lupus anticoagulant disorder ([place]) ?On Coumadin as outpatient.  Currently on hold due to supratherapeutic INR.  Pharmacy to manage.   ?  ?Personal history of osteosarcoma ?Status post right BKA. ?  ?Supratherapeutic INR ?Patient presented with INR of 7.6.  Coumadin on hold.  Received 1 dose of vitamin K on admission.  INR of 4.0 today.  Pharmacy managing. ?  ?Hypernatremia ?On D5 water.  Latest sodium level of 139.  BMP today. ? ?Mild hypokalemia.  Has been replenished.  Check BMP today. ? ?Dehydration ?Patient with decreased oral intake at home.  On D5 water at this time.. ?  ?Elevated CK, likely mild rhabdomyolysis. ?Will continue to rehydrate.    CK level today at 727 improved from 1454. ?  ?AKI (acute kidney injury) ([place]) ?Likely secondary to volume depletion.  Latest creatinine  at 0.9 ? ?  Depression ?On Effexor, Zyprexa at home.   Resume when appropriate.  Currently on hold. ?  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  Currently on IV AED. ?  ?Aspiration pneumonia ([place]) ?Likely secondary to metabolic encephalopathy.  On Unasyn.    Speech therapy on board and recommend dysphagia 1 diet at this time ? ?Severe protein calorie malnutrition. ?Present on admission.  Seen by  dietitian. ? ?Goals of care.  Palliative care on board and plan is to continue with full scope of treatment. ?   ? ? DVT prophylaxis: SCDs Start: 06/10/21 0036 ? ? ?Code Status:   ?  Code Status: Full Code ? ?Disposition: Home with home Health. ? ?Status is: Inpatient ? ?Remains inpatient appropriate because: metabolic encephalopathy, deconditioning debility, IV AED ? ? Family Communication: ?Spoke with the patient's mother on the phone on 06/12/2021 ? ?Consultants:  ?Neurology ?Palliative care ? ?Procedures:  ?Continuous EEG ? ?Antimicrobials:  ?Unasyn 4/19> ? ?Anti-infectives (From admission, onward)  ? ? Start     Dose/Rate Route Frequency Ordered Stop  ? 06/10/21 2300  cefTRIAXone (ROCEPHIN) 2 g in sodium chloride 0.9 % 100 mL IVPB  Status:  Discontinued       ? 2 g ?200 mL/hr over 30 Minutes Intravenous Every 24 hours 06/09/21 2325 06/10/21 0123  ? 06/10/21 1600  Ampicillin-Sulbactam (UNASYN) 3 g in sodium chloride 0.9 % 100 mL IVPB       ? 3 g ?200 mL/hr over 30 Minutes Intravenous Every 6 hours 06/10/21 0953    ? 06/10/21 0200  Ampicillin-Sulbactam (UNASYN) 3 g in sodium chloride 0.9 % 100 mL IVPB  Status:  Discontinued       ? 3 g ?200 mL/hr over 30 Minutes Intravenous Every 8 hours 06/10/21 0129 06/10/21 0953  ? 06/09/21 2300  cefTRIAXone (ROCEPHIN) 1 g in sodium chloride 0.9 % 100 mL IVPB       ? 1 g ?200 mL/hr over 30 Minutes Intravenous  Once 06/09/21 2247 06/09/21 2329  ? ?  ? ?Subjective: ?Today, patient was seen and examined at bedside.  Still has mumbled speech but was more alert awake and communicative.  Oriented to place and month but appears to be confused at times.  Denies any pain. ? ?Objective: ?Vitals:  ? 06/13/21 2040 06/14/21 0035 06/14/21 0430 06/14/21 0852  ?BP: (!) 164/73 127/77 126/71 118/63  ?Pulse: (!) 101 (!) 104 94 94  ?Resp: '20 20 20 14  '$ ?Temp: 98 ?F (36.7 ?C) 98.9 ?F (37.2 ?C) 98.4 ?F (36.9 ?C) 99.1 ?F (37.3 ?C)  ?TempSrc: Oral Oral Oral Oral  ?SpO2: 100%   100%  ?Weight:       ? ? ?Intake/Output Summary (Last 24 hours) at 06/14/2021 1102 ?Last data filed at 06/14/2021 0540 ?Gross per 24 hour  ?Intake 2008.63 ml  ?Output 2850 ml  ?Net -841.37 ml  ? ?Filed Weights  ? 06/13/21 0452  ?Weight: 76.6 kg  ? ? ?Physical Examination: ? ?General:  Average built, not in obvious distress, mildly communicative, mumbled speech ?HENT:   No scleral pallor or icterus noted. Oral mucosa is moist.  ?Chest:  Clear breath sounds.  Diminished breath sounds bilaterally. No crackles or wheezes.  ?CVS: S1 &S2 heard. No murmur.  Regular rate and rhythm. ?Abdomen: Soft, nontender, nondistended.  Bowel sounds are heard.   ?Extremities: Right above-knee amputation. ?Psych: Alert, awake and communicative.  Mumbled speech.  Oriented to place and month ?CNS: Moving all extremities.  Confused at times.  Follows commands. ?  Skin: Warm and dry.  No rashes noted. ? ?Data Reviewed:  ? ?CBC: ?Recent Labs  ?Lab 06/09/21 ?2216 06/10/21 ?3428 06/11/21 ?0250 06/12/21 ?7681 06/13/21 ?0106  ?WBC 9.0 9.0 7.4 5.8 5.6  ?NEUTROABS 7.2  --   --   --   --   ?HGB 11.9* 11.0* 9.9* 10.4* 9.6*  ?HCT 38.5 36.0 32.4* 32.7* 29.0*  ?MCV 92.3 92.5 92.0 87.9 87.3  ?PLT 229 205 199 214 237  ? ? ?Basic Metabolic Panel: ?Recent Labs  ?Lab 06/09/21 ?2216 06/10/21 ?1572 06/10/21 ?1655 06/11/21 ?0250 06/12/21 ?6203 06/13/21 ?0106 06/14/21 ?0819  ?NA 151* 150* 143 145 138 139 136  ?K 4.0 3.9 3.5 3.3* 3.0* 3.2* 4.2  ?CL 115* 119* 112* 112* 102 102 97*  ?CO2 '25 25 24 26 28 28 29  '$ ?GLUCOSE 132* 127* 288* 122* 138* 147* 113*  ?BUN 29* 24* 17 12 5* <5* 5*  ?CREATININE 1.90* 1.40* 1.04* 1.05* 0.70 0.90 0.84  ?CALCIUM 9.1 8.1* 7.6* 8.3* 8.3* 8.4* 9.0  ?MG 2.2 2.0  --   --  1.8 1.8  --   ?PHOS 4.5 3.1  --   --   --   --   --   ? ? ?Liver Function Tests: ?Recent Labs  ?Lab 06/09/21 ?2216 06/10/21 ?5597  ?AST 33 36  ?ALT 17 17  ?ALKPHOS 82 74  ?BILITOT 0.6 0.4  ?PROT 7.7 6.7  ?ALBUMIN 3.6 3.1*  ? ? ? ?Radiology Studies: ?No results found. ? ? ? LOS: 4 days   ? ? ?Flora Lipps, MD ?[facility] ?Available via Epic secure chat 7am-7pm ?After these hours, please refer to coverage provider listed on [email or web address] ?06/14/2021, 11:02 AM  ? ? ?

## 2021-06-15 ENCOUNTER — Inpatient Hospital Stay (HOSPITAL_COMMUNITY): Payer: Medicare Other

## 2021-06-15 DIAGNOSIS — N179 Acute kidney failure, unspecified: Secondary | ICD-10-CM | POA: Diagnosis not present

## 2021-06-15 DIAGNOSIS — I6990 Unspecified sequelae of unspecified cerebrovascular disease: Secondary | ICD-10-CM | POA: Diagnosis not present

## 2021-06-15 DIAGNOSIS — E860 Dehydration: Secondary | ICD-10-CM | POA: Diagnosis not present

## 2021-06-15 DIAGNOSIS — R4182 Altered mental status, unspecified: Secondary | ICD-10-CM | POA: Diagnosis not present

## 2021-06-15 DIAGNOSIS — J690 Pneumonitis due to inhalation of food and vomit: Secondary | ICD-10-CM | POA: Diagnosis not present

## 2021-06-15 DIAGNOSIS — G9341 Metabolic encephalopathy: Secondary | ICD-10-CM | POA: Diagnosis not present

## 2021-06-15 LAB — BASIC METABOLIC PANEL
Anion gap: 8 (ref 5–15)
BUN: <5 mg/dL — ABNORMAL LOW (ref 6–20)
CO2: 30 mmol/L (ref 22–32)
Calcium: 8.8 mg/dL — ABNORMAL LOW (ref 8.9–10.3)
Chloride: 96 mmol/L — ABNORMAL LOW (ref 98–111)
Creatinine, Ser: 0.88 mg/dL (ref 0.44–1.00)
GFR, Estimated: >60 mL/min (ref >60)
Glucose, Bld: 135 mg/dL — ABNORMAL HIGH (ref 70–99)
Potassium: 3.5 mmol/L (ref 3.5–5.1)
Sodium: 134 mmol/L — ABNORMAL LOW (ref 135–145)

## 2021-06-15 LAB — CBC
HCT: 29.4 % — ABNORMAL LOW (ref 36.0–46.0)
HCT: 30.6 % — ABNORMAL LOW (ref 36.0–46.0)
Hemoglobin: 9.2 g/dL — ABNORMAL LOW (ref 12.0–15.0)
Hemoglobin: 9.9 g/dL — ABNORMAL LOW (ref 12.0–15.0)
MCH: 27.7 pg (ref 26.0–34.0)
MCH: 28.3 pg (ref 26.0–34.0)
MCHC: 31.3 g/dL (ref 30.0–36.0)
MCHC: 32.4 g/dL (ref 30.0–36.0)
MCV: 88.6 fL (ref 80.0–100.0)
MCV: 87.4 fL (ref 80.0–100.0)
Platelets: 289 10*3/uL (ref 150–400)
Platelets: 325 10*3/uL (ref 150–400)
RBC: 3.32 MIL/uL — ABNORMAL LOW (ref 3.87–5.11)
RBC: 3.5 MIL/uL — ABNORMAL LOW (ref 3.87–5.11)
RDW: 13 % (ref 11.5–15.5)
RDW: 12.9 % (ref 11.5–15.5)
WBC: 6.1 10*3/uL (ref 4.0–10.5)
WBC: 5.5 10*3/uL (ref 4.0–10.5)
nRBC: 0 % (ref 0.0–0.2)
nRBC: 0 % (ref 0.0–0.2)

## 2021-06-15 LAB — MAGNESIUM: Magnesium: 1.9 mg/dL (ref 1.7–2.4)

## 2021-06-15 LAB — PROTIME-INR
INR: 2.7 — ABNORMAL HIGH (ref 0.8–1.2)
Prothrombin Time: 28.6 seconds — ABNORMAL HIGH (ref 11.4–15.2)

## 2021-06-15 MED ORDER — POTASSIUM CHLORIDE 10 MEQ/100ML IV SOLN
INTRAVENOUS | Status: AC
  Filled 2021-06-15: qty 100

## 2021-06-15 MED ORDER — POTASSIUM CHLORIDE 10 MEQ/100ML IV SOLN: 10.0000 meq | INTRAVENOUS | Status: DC

## 2021-06-15 MED ORDER — POTASSIUM CHLORIDE 10 MEQ/100ML IV SOLN
10.0000 meq | INTRAVENOUS | Status: AC
  Administered 2021-06-15 (×4): 10 meq via INTRAVENOUS
  Filled 2021-06-15 (×3): qty 100

## 2021-06-15 MED ORDER — WARFARIN SODIUM 7.5 MG PO TABS
7.5000 mg | ORAL_TABLET | Freq: Once | ORAL | Status: AC
Start: 2021-06-15 — End: 2021-06-15
  Administered 2021-06-15: 7.5 mg via ORAL
  Filled 2021-06-15: qty 1

## 2021-06-15 NOTE — Progress Notes (Addendum)
Subjective: NAEO. Per mother, patient appears to be improving.  ? ?ROS: negative except above ? ?Examination ? ?Vital signs in last 24 hours: ?Temp:  [98 ?F (36.7 ?C)-98.7 ?F (37.1 ?C)] 98.4 ?F (36.9 ?C) (04/24 1528) ?Pulse Rate:  [91-107] 95 (04/24 1528) ?Resp:  [16-20] 18 (04/24 1528) ?BP: (108-135)/(66-110) 108/77 (04/24 1528) ?SpO2:  [97 %-100 %] 98 % (04/24 1528) ? ?General: lying in bed, NAD ?Neuro: ?MS: Alert, oriented to person and place, not to time, follows commands ?CN: pupils equal and reactive,  able to track examiner but appears to have right gaze preference ?Motor: antigravity in BL UE and LLE. Lifts R AKA stump.  ? ? ?Basic Metabolic Panel: ?Recent Labs  ?Lab 06/09/21 ?2216 06/10/21 ?9678 06/10/21 ?1655 06/11/21 ?0250 06/12/21 ?9381 06/13/21 ?0106 06/14/21 ?0175 06/15/21 ?0302  ?NA 151* 150*   < > 145 138 139 136 134*  ?K 4.0 3.9   < > 3.3* 3.0* 3.2* 4.2 3.5  ?CL 115* 119*   < > 112* 102 102 97* 96*  ?CO2 25 25   < > '26 28 28 29 30  '$ ?GLUCOSE 132* 127*   < > 122* 138* 147* 113* 135*  ?BUN 29* 24*   < > 12 5* <5* 5* <5*  ?CREATININE 1.90* 1.40*   < > 1.05* 0.70 0.90 0.84 0.88  ?CALCIUM 9.1 8.1*   < > 8.3* 8.3* 8.4* 9.0 8.8*  ?MG 2.2 2.0  --   --  1.8 1.8  --  1.9  ?PHOS 4.5 3.1  --   --   --   --   --   --   ? < > = values in this interval not displayed.  ? ? ?CBC: ?Recent Labs  ?Lab 06/09/21 ?2216 06/10/21 ?1025 06/11/21 ?0250 06/12/21 ?8527 06/13/21 ?0106 06/15/21 ?0302 06/15/21 ?1539  ?WBC 9.0   < > 7.4 5.8 5.6 6.1 5.5  ?NEUTROABS 7.2  --   --   --   --   --   --   ?HGB 11.9*   < > 9.9* 10.4* 9.6* 9.2* 9.9*  ?HCT 38.5   < > 32.4* 32.7* 29.0* 29.4* 30.6*  ?MCV 92.3   < > 92.0 87.9 87.3 88.6 87.4  ?PLT 229   < > 199 214 237 289 325  ? < > = values in this interval not displayed.  ? ? ? ?Coagulation Studies: ?Recent Labs  ?  06/13/21 ?0106 06/14/21 ?0313 06/15/21 ?0302  ?LABPROT 36.1* 39.0* 28.6*  ?INR 3.7* 4.0* 2.7*  ? ? ?Imaging ?MR Brain wo contrast 06/15/2021: 1. No acute intracranial abnormality or  significant interval change. ?2. Stable encephalomalacia of the right temporal lobe and superior ?right parietal lobe. ?3. Stable atrophy and white matter disease. This likely reflects the ?sequela of chronic microvascular ischemia. ? ? ?ASSESSMENT AND PLAN: 56 year old F with past history of hemorrhagic stroke and seizures presented with altered mental status  ? ?Acute encephalopathy, improving ?- Multifactorial due to UTI, sedation medications like olanzapine, AKI. No evidence of seizure on Long term EEG, no stroke on MRI ? ?Recommendations ?- Continue Briviact '100mg'$  BID, Lacosamide '200mg'$  BID, lamotrigine '350mg'$  BID. Can switch to PO when able ?- Hold olanzapine for now, can resume slowly if needed ?- VNS was turned off for MRI and turned back to previous settings ?- Continue sz precautions ?- PRN IV ativan '2mg'$  for clinical sz ?- Discussed plan with mother at bedside ?-Follow-up with neurology in 2 to 3 months after discharge ? ?  I have spent a total of  39 minutes with the patient reviewing hospital notes,  test results, labs and examining the patient as well as establishing an assessment and plan that was discussed personally with the patient and mother at bedside.  > 50% of time was spent in direct patient care. ?  ?Zeb Comfort ?Epilepsy ?[facility] ?For questions after 5pm please refer to AMION to reach the Neurologist on call ? ?

## 2021-06-15 NOTE — Progress Notes (Signed)
ANTICOAGULATION CONSULT NOTE - Follow Up Consult ? ?Pharmacy Consult for Warfarin ?Indication:  lupus anticoagulant disorder, history of DVT ? ?Allergies  ?Allergen Reactions  ? Vicodin [Hydrocodone-Acetaminophen] Nausea And Vomiting  ? Morphine Rash  ? Morphine And Related Rash  ?  Allergic reaction only in iv form  ? ? ?Patient Measurements: ?Height: '5\' 6"'$  (167.6 cm) ?Weight: 76.6 kg (168 lb 14 oz) ?IBW/kg (Calculated) : 59.3 ? ?Vital Signs: ?Temp: 98.7 ?F (37.1 ?C) (04/24 1203) ?Temp Source: Axillary (04/24 1203) ?BP: 135/84 (04/24 1203) ?Pulse Rate: 94 (04/24 1203) ? ?Labs: ?Recent Labs  ?  06/13/21 ?0106 06/14/21 ?0313 06/14/21 ?6270 06/15/21 ?0302  ?HGB 9.6*  --   --  9.2*  ?HCT 29.0*  --   --  29.4*  ?PLT 237  --   --  289  ?LABPROT 36.1* 39.0*  --  28.6*  ?INR 3.7* 4.0*  --  2.7*  ?CREATININE 0.90  --  0.84 0.88  ? ? ?Estimated Creatinine Clearance: 76.4 mL/min (by C-G formula based on SCr of 0.88 mg/dL). ? ?Assessment: ?56 y.o. female with medical history significant for history of DVT and lupus anticoagulant on warfarin PTA who presented to hospital with acute confusion and mental status. Pharmacy consulted to manage warfarin. ? ?INR was supratherapeutic (7.6) on admit 4/18 and Vitamin K 1 mg IV was given.  ?INR remained supratherapeutic the next 5 days, but down to therapeutic range (2.7) today.  Has had decreased PO intake. ? ? PTA warfarin: 10 mg daily except 7.5 mg on Sundays and Wednesdays. ? ?Goal of Therapy:  ?INR 2-3 ?Monitor platelets by anticoagulation protocol: Yes ?  ?Plan:  ?Resume Warfarin with 7.5 mg x 1 today. ?Daily PT/INR. ? ?Arty Baumgartner, RPh ?06/15/2021,12:30 PM ? ? ?

## 2021-06-15 NOTE — Progress Notes (Signed)
Speech Language Pathology Treatment: Dysphagia  ?Patient Details ?Name: Cassandra Andrade ?MRN: [id number] ?DOB: March 09, 1965 ?Today's Date: 06/15/2021 ?Time: 7654-6503 ?SLP Time Calculation (min) (ACUTE ONLY): 8 min ? ?Assessment / Plan / Recommendation ?Clinical Impression ? Tried to see pt this afternoon with a meal and possible advanced trials of solids; however, pt reported nausea and was only agreeable to having sips of thin liquids. No oral holding or overt signs of dysphagia or aspiration were noted with SLP providing assist in administering small sips. Will leave on current diet for now and will f/u for potential to advance. RN made aware of pt's nausea.  ?  ?HPI HPI: Per MD "57 year old Female.  Past medical history of stroke, seizures, SAH, DVT/PE (warfarin presented with altered mental status. had a medication change 1 week ago.  Pt seen by neurology and diagnosed with encephalopathy. 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 There is partial consolidative  changes of the right lower lobe consistent with atelectasis or  pneumonia. 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  ?   ?SLP Plan ? Continue with current plan of care ? ?  ?  ?Recommendations for follow up therapy are one component of a multi-disciplinary discharge planning process, led by the attending physician.  Recommendations may be updated based on patient status, additional functional criteria and insurance authorization. ?  ? ?Recommendations  ?Diet recommendations: Dysphagia 1 (puree);Thin liquid ?Liquids provided via: Cup;Straw ?Medication Administration: Crushed with puree ?Supervision: Full supervision/cueing for compensatory strategies;Trained caregiver to feed  patient;Staff to assist with self feeding ?Compensations: Slow rate;Small sips/bites;Minimize environmental distractions ?Postural Changes and/or Swallow Maneuvers: Seated upright 90 degrees  ?   ?    ?   ? ? ? ? Oral Care Recommendations: Oral care BID ?Follow Up Recommendations: Home health SLP ?Assistance recommended at discharge: Frequent or constant Supervision/Assistance ?SLP Visit Diagnosis: Dysphagia, oropharyngeal phase (R13.12) ?Plan: Continue with current plan of care ? ? ? ? ?  ?  ? ? ?Osie Bond., M.A. CCC-SLP ?[facility] ?Office [phone] ? ?Secure chat preferred ? ? ?06/15/2021, 5:14 PM ?

## 2021-06-15 NOTE — Care Plan (Signed)
VNS was interrogated by Dr. Zeb Comfort on 06/15/2021 at 12:18 PM.  VNS settings as noted below ? ? Normal Magnet  ?Output 67m 2.246m ?Frequency '30Hz'$    ?Pulse Width 250usec 250usec  ?On time 21 sec 60 sec  ?Off time 1.1 min   ?Duty cycle 29%   ? ? ?Was then turned off by Dr. PrZeb Comfort/24/2023 at 12:19 PM.  Current VNS settings as follows ? ? ? Normal Magnet  ?Output 53m29mmA34mFrequency '30Hz'$    ?Pulse Width 250usec 250usec  ?On time 21 sec 60 sec  ?Off time 1.1 min   ?Duty cycle 29%   ? ?Please contact me after MRI to turn on the VNS back to previous settings. ? ?PriyLora Havens

## 2021-06-15 NOTE — Progress Notes (Signed)
PT Cancellation Note ? ?Patient Details ?Name: Cassandra Andrade ?MRN: [id number] ?DOB: [birth year] ? ? ?Cancelled Treatment:    Reason Eval/Treat Not Completed: Patient not medically ready;Patient declined, no reason specified.  Pt declined stated she was too nauseated. ?06/15/2021 ? ?Cassandra Carne., PT ?Acute Rehabilitation Services ?[phone]  (pager) ?[phone]  (office) ? ? ?Cassandra Andrade ?06/15/2021, 6:27 PM ?

## 2021-06-15 NOTE — Plan of Care (Signed)

## 2021-06-15 NOTE — Progress Notes (Signed)
?PROGRESS NOTE ? ? ? ?Cassandra Andrade  IEP:[id number] DOB: Jul 09, 1965 DOA: 06/09/2021 ?PCP: Berkley Harvey, NP  ? ? ?Brief Narrative:  ?Patient is a 56 y.o. 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  In the ED, patient was noted to be hypernatremic with acute kidney injury and UTI.  Patient was also seen by telemetry neurology who made changes to seizure medication regimen.  Patient was then considered for admission to the hospital. ? ?Assessment and Plan: ?Principal Problem: ?  Acute metabolic encephalopathy ?Active Problems: ?  Sepsis ([place]) ?  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  UTI (urinary tract infection) ?  Hypothyroid ?  AKI (acute kidney injury) ([place]) ?  Depression ?  DVT, bilateral lower limbs ([place]) ?  Supratherapeutic INR ?  Elevated CK ?  Dehydration ?  Hypernatremia ?  S/P BKA (below knee amputation) ([place]) ?  Seizure ([place]) ?  Lupus anticoagulant disorder ([place]) ?  Cerebrovascular accident, late effects ?  Personal history of osteosarcoma ?  Aspiration pneumonia ([place]) ?  Constipation ?  Protein-calorie malnutrition, severe ?  ?Acute metabolic encephalopathy ?Likely secondary to UTI, dehydration and hypernatremia and possible seizures..   Neurology on board. Received  EEG monitoring.  EEG reported 06/13/21 shows evidence of epileptogenicity arising from the right frontocentral region.  Currently on IV briviact '100mg'$  BID,  lamotrignine '350mg'$  BID, and vimpat '200mg'$  bid.  Pending MRI of the brain.  Spoke with the interdisciplinary team at rounds.  Patient still is mumbling but oriented to place ? ?Rule out seizures and status epilepticus.  Received  continuous EEG.  On brivaracetam, Vimpat.  Continue to hold venlafaxine and olanzapine.   Follow MRI brain when able.  Patient does have a VNS.  Neurology on board, follow recommendations ? ?Sepsis ([place]) secondary to UTI. ?Patient had  fever with tachycardia and urinalysis showed  UTI.  On empiric IV Unasyn.  Will complete a 5-day course.  Urine culture showing multiple species at this time.  Blood cultures negative in 4 days.  We will continue antibiotic to complete the course.  No leukocytosis or fever at this time. ?  ?S/P BKA (below knee amputation) ([place]) ?Chronic stable ?  ?Hypothyroidism ?On IV Synthroid at this time.  Resume oral when mental status improves. On dysphagia 1 diet at this time. ?   ?DVT, bilateral lower limbs ([place]) ?-Presented with supratherapeutic INR.  On Coumadin as outpatient..  INR of 2.7 today.  Pharmacy to manage.  ?  ?Lupus anticoagulant disorder ([place]) ?On Coumadin as outpatient.   Pharmacy to manage.   ?  ?Personal history of osteosarcoma ?Status post right BKA. ?  ?Supratherapeutic INR ?Patient presented with INR of 7.6.  Coumadin on hold.  Received 1 dose of vitamin K on admission.  INR of 4.0 today.  Pharmacy managing. ?  ?Hypernatremia ?On D5 water.  Latest sodium level of 134.  On dysphagia 1 diet.  We will continue for now. ? ?Mild hypokalemia.  Has been replenished.  Potassium of 3.5.  We will continue to replenish. ? ?Dehydration ?Patient with decreased oral intake at home.  On D5 water at this time.. ?  ?Elevated CK, likely mild  rhabdomyolysis. ?Will continue to rehydrate.    Latest CK level at 727 improved from 1454. ?  ?AKI (acute kidney injury) ([place]) ?Likely secondary to volume depletion.  Latest creatinine  at 0.8 ? ?Depression ?On Effexor, Zyprexa at home.   Resume when appropriate.  Currently on hold. ?  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  Currently on IV AED. ?  ?Aspiration pneumonia ([place]) ?Likely secondary to metabolic encephalopathy.  On Unasyn.    Speech  therapy on board and recommend dysphagia 1 diet at this time ? ?Severe protein calorie malnutrition. ?Present on admission.  Seen by dietitian. ? ?Goals of care.  Palliative care on board and plan is to continue with full scope of treatment. ?   ? ? DVT prophylaxis: SCDs Start: 06/10/21 0036 ? ? ?Code Status:   ?  Code Status: Full Code ? ?Disposition: Home with home Health as per PT, pending clinical improvement. ? ?Status is: Inpatient ? ?Remains inpatient appropriate because: Ongoing metabolic encephalopathy, deconditioning debility, IV AED, pending clinical improvement, ? ? Family Communication: ?Spoke with the patient's mother on the phone on 06/12/2021 ? ?Consultants:  ?Neurology ?Palliative care ? ?Procedures:  ?Continuous EEG ? ?Antimicrobials:  ?Unasyn 4/19> ? ?Anti-infectives (From admission, onward)  ? ? Start     Dose/Rate Route Frequency Ordered Stop  ? 06/10/21 2300  cefTRIAXone (ROCEPHIN) 2 g in sodium chloride 0.9 % 100 mL IVPB  Status:  Discontinued       ? 2 g ?200 mL/hr over 30 Minutes Intravenous Every 24 hours 06/09/21 2325 06/10/21 0123  ? 06/10/21 1600  Ampicillin-Sulbactam (UNASYN) 3 g in sodium chloride 0.9 % 100 mL IVPB       ? 3 g ?200 mL/hr over 30 Minutes Intravenous Every 6 hours 06/10/21 0953    ? 06/10/21 0200  Ampicillin-Sulbactam (UNASYN) 3 g in sodium chloride 0.9 % 100 mL IVPB  Status:  Discontinued       ? 3 g ?200 mL/hr over 30 Minutes Intravenous Every 8 hours 06/10/21 0129 06/10/21 0953  ? 06/09/21 2300  cefTRIAXone (ROCEPHIN) 1 g in sodium chloride 0.9 % 100 mL IVPB       ? 1 g ?200 mL/hr over 30 Minutes Intravenous  Once 06/09/21 2247 06/09/21 2329  ? ?  ? ?Subjective: ?Today, patient was seen and examined at bedside.  Patient is still continues to [facility] and is poor historian. ? ?Objective: ?Vitals:  ? 06/14/21 2347 06/14/21 2350 06/15/21 0405 06/15/21 0756  ?BP: (!) 122/110 130/66 113/85 118/75  ?Pulse: 91 (!) 107 (!) 101 95  ?Resp:  '18 20 18  '$ ?Temp: 98.6 ?F (37 ?C) 98.6 ?F  (37 ?C) 98 ?F (36.7 ?C) 98.7 ?F (37.1 ?C)  ?TempSrc: Oral  Oral Axillary  ?SpO2: 99% 100% 97% 100%  ?Weight:      ? ? ?Intake/Output Summary (Last 24 hours) at 06/15/2021 1136 ?Last data filed at 06/15/2021 [phone] ?Gross per 24 hour  ?Intake 4223.96 ml  ?Output 1600 ml  ?Net 2623.96 ml  ? ?Filed Weights  ? 06/13/21 0452  ?Weight: 76.6 kg  ? ? ?Physical Examination: ? ?General:  Average built, not in obvious distress, mumbled speech, incomprehensible at times, disoriented ?HENT:   No scleral pallor or icterus noted. Oral mucosa is moist.  ?Chest:    Diminished breath sounds bilaterally. No crackles or wheezes.  ?CVS: S1 &S2 heard. No murmur.  Regular rate and rhythm. ?Abdomen: Soft, nontender, nondistended.  Bowel sounds are heard.   ?Extremities: No  cyanosis, clubbing or edema.  Peripheral pulses are palpable. ?Psych: Mumbled speech. ?CNS: Mumbled speech, disoriented and confused at times, oriented to place.  Follows few commands ?Skin: Warm and dry.  No rashes noted. ? ?Data Reviewed:  ? ?CBC: ?Recent Labs  ?Lab 06/09/21 ?2216 06/10/21 ?4166 06/11/21 ?0250 06/12/21 ?0630 06/13/21 ?0106 06/15/21 ?0302  ?WBC 9.0 9.0 7.4 5.8 5.6 6.1  ?NEUTROABS 7.2  --   --   --   --   --   ?HGB 11.9* 11.0* 9.9* 10.4* 9.6* 9.2*  ?HCT 38.5 36.0 32.4* 32.7* 29.0* 29.4*  ?MCV 92.3 92.5 92.0 87.9 87.3 88.6  ?PLT 229 205 199 214 237 289  ? ? ?Basic Metabolic Panel: ?Recent Labs  ?Lab 06/09/21 ?2216 06/10/21 ?1601 06/10/21 ?1655 06/11/21 ?0250 06/12/21 ?0932 06/13/21 ?0106 06/14/21 ?3557 06/15/21 ?0302  ?NA 151* 150*   < > 145 138 139 136 134*  ?K 4.0 3.9   < > 3.3* 3.0* 3.2* 4.2 3.5  ?CL 115* 119*   < > 112* 102 102 97* 96*  ?CO2 25 25   < > '26 28 28 29 30  '$ ?GLUCOSE 132* 127*   < > 122* 138* 147* 113* 135*  ?BUN 29* 24*   < > 12 5* <5* 5* <5*  ?CREATININE 1.90* 1.40*   < > 1.05* 0.70 0.90 0.84 0.88  ?CALCIUM 9.1 8.1*   < > 8.3* 8.3* 8.4* 9.0 8.8*  ?MG 2.2 2.0  --   --  1.8 1.8  --  1.9  ?PHOS 4.5 3.1  --   --   --   --   --   --   ? < > =  values in this interval not displayed.  ? ? ?Liver Function Tests: ?Recent Labs  ?Lab 06/09/21 ?2216 06/10/21 ?3220  ?AST 33 36  ?ALT 17 17  ?ALKPHOS 82 74  ?BILITOT 0.6 0.4  ?PROT 7.7 6.7  ?ALBUMIN 3.

## 2021-06-15 NOTE — Progress Notes (Signed)
?                                                   ?Palliative Care Progress Note, Assessment & Plan  ? ?Patient Name: Cassandra Andrade       Date: 06/15/2021 ?DOB: 09/07/1965  Age: 56 y.o. MRN#: [id number] ?Attending Physician: Flora Lipps, MD ?Primary Care Physician: Berkley Harvey, NP ?Admit Date: 06/09/2021 ? ?Reason for Consultation/Follow-up: Establishing goals of care ? ?Subjective: ?Is sitting up in bed in no apparent distress.  She is able to acknowledge my presence and make her wishes known.  She tracks me with her eyes.  She is able to interact with me appropriately.  She knows her name and that she is in the hospital.  She cannot recall many more additional facts but can share that she is not in pain and is comfortable. ? ?HPI: ?56 y.o. female  with past medical history of seizure disorder, stroke r/t aneurysm rupture s/p VNS placement, hx of DVT, Lupus (Coumadin), hx of osteosarcoma s/p right AKA, and depression admitted on 06/09/2021 with confusion, combative behavior, and poor PO intake.  ?  ?Pt is being treated for UTI, supratherapuetic INR, aspiration PNA, and elevated CK.  ? ?LTM EEG has been completed. Pt scheduled for MRI today.  ? ?Summary of counseling/coordination of care: ?After reviewing the patient's chart, I assessed the patient at bedside.  She is more interactive with me than she was a few days ago.  She is able to state her name and that she is in the hospital.  After our visit, I spoke with patient's mother [name] over the phone and conveyed the above findings. ? ?[name] had several questions regarding the patient's neurological status, including questions about how much brain cell death occurs with her seizures, if her VNS is contributing to her problem, and does the patient suffer from dementia.  We discussed these topics and I shared that I would  ask neurology to contact her to answer these questions in detail. Dr. Hortense Ramal made aware and plans to speak with [name] once MRI is completed. ? ?Discussed that PMT role is to support patient and family as well as continue discussions regarding GO and ACP. [name] confirmed that plan remains for full code and full scope with plans for patient to return home with mother with [facility].  ? ?PMT will continue to follow the patient throughout her hospitalization.  ? ?Code Status: ?Full code ? ?Prognosis: ?Unable to determine ? ?Discharge Planning: ?Home with Home Health ? ?Care plan was discussed with patient, patient's mother, Dr. Hortense Ramal ? ?Physical Exam ?Vitals reviewed.  ?Constitutional:   ?   General: She is not in acute distress. ?   Appearance: She is not toxic-appearing.  ?HENT:  ?   Head: Normocephalic.  ?   Mouth/Throat:  ?   Mouth: Mucous membranes are moist.  ?Cardiovascular:  ?   Rate and Rhythm: Normal rate.  ?   Pulses: Normal pulses.  ?Pulmonary:  ?   Effort: Pulmonary effort is normal.  ?Abdominal:  ?   General: Abdomen is flat.  ?Musculoskeletal:  ?   Comments: Generalized weakness, right AKA, LUE weakness, mitt in place on RUE  ?Skin: ?   General: Skin is warm and dry.  ?Neurological:  ?   Mental Status: She is alert.  ?  Comments: Oriented to self and place  ?         ? ?Palliative Assessment/Data: 40% ? ? ? ?Total Time 25 minutes  ?Greater than 50%  of this time was spent counseling and coordinating care related to the above assessment and plan. ? ?Thank you for allowing the Palliative Medicine Team to assist in the care of this patient. ? ?Verdell Carmine. Emalynn Clewis, DNP, FNP-BC ?Palliative Medicine Team ?Team Phone # [phone] ?  ?

## 2021-06-15 NOTE — Care Management Important Message (Signed)
Important Message ? ?Patient Details  ?Name: Cassandra Andrade ?MRN: [id number] ?Date of Birth: 08-20-1965 ? ? ?Medicare Important Message Given:  Yes ? ? ? ? ?Diann Bangerter ?06/15/2021, 4:09 PM ?

## 2021-06-16 DIAGNOSIS — G9341 Metabolic encephalopathy: Secondary | ICD-10-CM | POA: Diagnosis not present

## 2021-06-16 DIAGNOSIS — J690 Pneumonitis due to inhalation of food and vomit: Secondary | ICD-10-CM | POA: Diagnosis not present

## 2021-06-16 DIAGNOSIS — R4182 Altered mental status, unspecified: Secondary | ICD-10-CM | POA: Diagnosis not present

## 2021-06-16 DIAGNOSIS — N179 Acute kidney failure, unspecified: Secondary | ICD-10-CM | POA: Diagnosis not present

## 2021-06-16 LAB — CULTURE, BLOOD (ROUTINE X 2): Culture: NO GROWTH

## 2021-06-16 LAB — PROTIME-INR
INR: 2.5 — ABNORMAL HIGH (ref 0.8–1.2)
Prothrombin Time: 26.9 seconds — ABNORMAL HIGH (ref 11.4–15.2)

## 2021-06-16 LAB — BASIC METABOLIC PANEL
Anion gap: 7 (ref 5–15)
BUN: <5 mg/dL — ABNORMAL LOW (ref 6–20)
CO2: 33 mmol/L — ABNORMAL HIGH (ref 22–32)
Calcium: 9.2 mg/dL (ref 8.9–10.3)
Chloride: 98 mmol/L (ref 98–111)
Creatinine, Ser: 0.97 mg/dL (ref 0.44–1.00)
GFR, Estimated: >60 mL/min (ref >60)
Glucose, Bld: 148 mg/dL — ABNORMAL HIGH (ref 70–99)
Potassium: 3.7 mmol/L (ref 3.5–5.1)
Sodium: 138 mmol/L (ref 135–145)

## 2021-06-16 LAB — MAGNESIUM: Magnesium: 1.9 mg/dL (ref 1.7–2.4)

## 2021-06-16 MED ORDER — SODIUM CHLORIDE 0.9 % IV SOLN
200.0000 mg | Freq: Two times a day (BID) | INTRAVENOUS | Status: AC
  Administered 2021-06-16: 200 mg via INTRAVENOUS
  Filled 2021-06-16: qty 20

## 2021-06-16 MED ORDER — WARFARIN SODIUM 6 MG PO TABS
6.0000 mg | ORAL_TABLET | Freq: Once | ORAL | Status: AC
  Administered 2021-06-16: 6 mg via ORAL
  Filled 2021-06-16: qty 1

## 2021-06-16 MED ORDER — SODIUM CHLORIDE 0.9 % IV SOLN
100.0000 mg | Freq: Two times a day (BID) | INTRAVENOUS | Status: AC
  Administered 2021-06-16: 100 mg via INTRAVENOUS
  Filled 2021-06-16: qty 10

## 2021-06-16 MED ORDER — LACOSAMIDE 200 MG PO TABS
200.0000 mg | ORAL_TABLET | Freq: Two times a day (BID) | ORAL | Status: DC
  Administered 2021-06-16 – 2021-06-17 (×2): 200 mg via ORAL
  Filled 2021-06-16 (×2): qty 1

## 2021-06-16 MED ORDER — BRIVARACETAM 25 MG PO TABS
100.0000 mg | ORAL_TABLET | Freq: Two times a day (BID) | ORAL | Status: DC
  Administered 2021-06-16 – 2021-06-17 (×2): 100 mg via ORAL
  Filled 2021-06-16 (×2): qty 4

## 2021-06-16 NOTE — Plan of Care (Signed)
Pt sons came to see her today. She was more talkative but also emotional and getting upset about little things. Once they left she was back to how she was this morning. Calm and cooperative. Patient ate well for breakfast. ST came to revaluate the pt again per MD.  ?  ?Problem: Education: ?Goal: Knowledge of General Education information will improve ?Description: Including pain rating scale, medication(s)/side effects and non-pharmacologic comfort measures ?Outcome: Progressing ?  ?Problem: Health Behavior/Discharge Planning: ?Goal: Ability to manage health-related needs will improve ?Outcome: Progressing ?  ?Problem: Clinical Measurements: ?Goal: Ability to maintain clinical measurements within normal limits will improve ?Outcome: Progressing ?Goal: Will remain free from infection ?Outcome: Progressing ?Goal: Diagnostic test results will improve ?Outcome: Progressing ?Goal: Respiratory complications will improve ?Outcome: Progressing ?Goal: Cardiovascular complication will be avoided ?Outcome: Progressing ?  ?Problem: Activity: ?Goal: Risk for activity intolerance will decrease ?Outcome: Progressing ?  ?Problem: Nutrition: ?Goal: Adequate nutrition will be maintained ?Outcome: Progressing ?  ?Problem: Coping: ?Goal: Level of anxiety will decrease ?Outcome: Progressing ?  ?Problem: Elimination: ?Goal: Will not experience complications related to bowel motility ?Outcome: Progressing ?Goal: Will not experience complications related to urinary retention ?Outcome: Progressing ?  ?Problem: Pain Managment: ?Goal: General experience of comfort will improve ?Outcome: Progressing ?  ?Problem: Safety: ?Goal: Ability to remain free from injury will improve ?Outcome: Progressing ?  ?Problem: Skin Integrity: ?Goal: Risk for impaired skin integrity will decrease ?Outcome: Progressing ?  ?

## 2021-06-16 NOTE — Progress Notes (Signed)
ANTICOAGULATION CONSULT NOTE - Follow Up Consult ? ?Pharmacy Consult for Warfarin ?Indication:  lupus anticoagulant disorder, history of DVT ? ?Allergies  ?Allergen Reactions  ? Vicodin [Hydrocodone-Acetaminophen] Nausea And Vomiting  ? Morphine Rash  ? Morphine And Related Rash  ?  Allergic reaction only in iv form  ? ? ?Patient Measurements: ?Height: '5\' 6"'$  (167.6 cm) ?Weight: 76.6 kg (168 lb 14 oz) ?IBW/kg (Calculated) : 59.3 ? ?Vital Signs: ?Temp: 98.3 ?F (36.8 ?C) (04/25 0400) ?Temp Source: Axillary (04/25 0400) ?BP: 119/62 (04/25 1204) ?Pulse Rate: 97 (04/25 1204) ? ?Labs: ?Recent Labs  ?  06/14/21 ?0313 06/14/21 ?9381 06/15/21 ?0302 06/15/21 ?1539 06/16/21 ?0248  ?HGB  --   --  9.2* 9.9*  --   ?HCT  --   --  29.4* 30.6*  --   ?PLT  --   --  289 325  --   ?LABPROT 39.0*  --  28.6*  --  26.9*  ?INR 4.0*  --  2.7*  --  2.5*  ?CREATININE  --  0.84 0.88  --  0.97  ? ? ? ?Estimated Creatinine Clearance: 69.3 mL/min (by C-G formula based on SCr of 0.97 mg/dL). ? ?Assessment: ?56 y.o. female with medical history significant for history of DVT and lupus anticoagulant on warfarin PTA who presented to hospital with acute confusion and mental status. Pharmacy consulted to manage warfarin. ? ?INR was supratherapeutic (7.6) on admit 4/18 and Vitamin K 1 mg IV was given.  ?INR remained supratherapeutic the next 5 days > then 2.7 and today INR is 2.5.   Therapeutic INR today. Has had decreased PO intake, however ate 100% of breakfast this AM plus Ensure 237 ml this AM.  ? INR trend downward has tapered off (4.0>2.7>2.5) and just resumed warfarin yesterday with 7.5 mg yesterday.  Will dose cautiously with lower dose to to avoid INR bumping back up above goal.  ? ? PTA warfarin: 10 mg daily except 7.5 mg on Sundays and Wednesdays. ? ?Goal of Therapy:  ?INR 2-3 ?Monitor platelets by anticoagulation protocol: Yes ?  ?Plan:  ?Give Warfarin with 6 mg x 1 today. ?Daily PT/INR. ? ?Arman Bogus, RPh ?06/16/2021,2:25 PM ? ? ?

## 2021-06-16 NOTE — TOC Progression Note (Signed)
Transition of Care (TOC) - Progression Note  ? ? ?Patient Details  ?Name: Cassandra Andrade ?MRN: [id number] ?Date of Birth: 1965/03/23 ? ?Transition of Care (TOC) CM/SW Contact  ?Pollie Friar, RN ?Phone Number: ?06/16/2021, 2:00 PM ? ?Clinical Narrative:    ?Touched base with patients mother about HH, palliative care, hoyer lift, PCS services and transport for when she is ready for d/c home.  ?TOC following. ? ? ?Expected Discharge Plan: [place] ?Barriers to Discharge: Continued Medical Work up ? ?Expected Discharge Plan and Services ?Expected Discharge Plan: [place] ?  ?Discharge Planning Services: CM Consult ?Post Acute Care Choice: Home Health, Durable Medical Equipment ?Living arrangements for the past 2 months: [place] ?                ?DME Arranged: Other see comment [facility] Lemon lift) ?  ?  ?  ?  ?HH Arranged: PT, OT, Social Work ?[place] Agency: [place] ([place]) ?  ?  ?  ? ? ?Social Determinants of Health (SDOH) Interventions ?  ? ?Readmission Risk Interventions ?   ? View : No data to display.  ?  ?  ?  ? ? ?

## 2021-06-16 NOTE — Care Plan (Signed)
VNS was interrogated by Dr. Zeb Comfort on 06/15/2021 at 1510 PM.  VNS settings as noted below ?  ?  Normal Magnet  ?Output 46m 064m ?Frequency '30Hz'$     ?Pulse Width 250usec 250usec  ?On time 21 sec 60 sec  ?Off time 1.1 min    ?Duty cycle 29%    ? ?VNS was turned back on to previous settings.  ? ?  Normal Magnet  ?Output 65m30m.73m73mFrequency '30Hz'$     ?Pulse Width 250usec 250usec  ?On time 21 sec 60 sec  ?Off time 1.1 min    ?Duty cycle 29%    ? ? ?Cariah Salatino O YaBarbra Sarks

## 2021-06-16 NOTE — Progress Notes (Signed)
Occupational Therapy Treatment ?Patient Details ?Name: Cassandra Andrade ?MRN: [id number] ?DOB: Sep 05, 1965 ?Today's Date: 06/16/2021 ? ? ?History of present illness Pt is a 56 y.o. female admitted 4/18 with acute metabolic encephalopathy. 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  ?OT comments ? Pt making slow progression towards goals, requiring max total A +2 for squat pivot and sit > stand transfers from chair during session. Pt more alert compared to evaluation, follows single step commands with increased time. Pt with decreased cognition, oriented to self and knows she is at a hospital, decreased attention to tasks, tracks horizontally for visual assessment, unable to track into upper/lower quadrants. Pt presenting with impairments listed below, will follow acutely. Recommend HHOT along with PCA and hoyer lift at d/c. ? ?Discussed with pt's mother via phone, confirmed DME/home environment, mother is concerned about her ability to care for pt given increased weakness. Discussed current recommendations for PCA/aide, HHOT, and hoyer lift. Pt's mother in agreement, wanting to bring pt home as pt disliked previous SNF experience.  ? ?Recommendations for follow up therapy are one component of a multi-disciplinary discharge planning process, led by the attending physician.  Recommendations may be updated based on patient status, additional functional criteria and insurance authorization. ?   ?Follow Up Recommendations ? Home health OT (and aide/PCA)  ?  ?Assistance Recommended at Discharge Frequent or constant Supervision/Assistance  ?Patient can return home with the following ? Two people to help with walking and/or transfers;Two people to help with bathing/dressing/bathroom;Assistance with cooking/housework;Assistance with feeding;Direct supervision/assist for medications management;Direct supervision/assist for financial  management;Help with stairs or ramp for entrance ?  ?Equipment Recommendations ? Other (comment) (hoyer lift)  ?  ?Recommendations for Other Services   ? ?  ?Precautions / Restrictions Precautions ?Precautions: Fall ?Precaution Comments: Per family recent h/o aggressive behavior (i.e. hitting, punching) ?Restrictions ?Weight Bearing Restrictions: No  ? ? ?  ? ?Mobility Bed Mobility ?Overal bed mobility: Needs Assistance ?Bed Mobility: Rolling, Sidelying to Sit ?Rolling: Total assist, +2 for physical assistance, +2 for safety/equipment ?Sidelying to sit: Total assist, +2 for physical assistance, +2 for safety/equipment ?  ?  ?  ?General bed mobility comments: cued/assist to normalize roll R to L, assisted L LE off bed and 2 person assist up via L UE/elbow, positioned R UE to assist optimally.  Used padding to assist asymmetrical scoot to EOB. ?  ? ?Transfers ?Overall transfer level: Needs assistance ?Equipment used: None ?Transfers: Sit to/from Stand, Bed to chair/wheelchair/BSC ?Sit to Stand: +2 physical assistance, Max assist, Total assist ?  ?Squat pivot transfers: Total assist, +2 physical assistance ?  ?  ?  ?General transfer comment: pt requiring increased cuing to keep head up/ grasp therapist with RUE during trasnfer ?  ?  ?Balance Overall balance assessment: Needs assistance ?Sitting-balance support: Single extremity supported, Feet supported ?Sitting balance-Leahy Scale: Poor ?Sitting balance - Comments: R lateral/posterior elan ?  ?  ?Standing balance-Leahy Scale: Zero ?  ?  ?  ?  ?  ?  ?  ?  ?  ?  ?  ?  ?   ? ?ADL either performed or assessed with clinical judgement  ? ?ADL Overall ADL's : Needs assistance/impaired ?Eating/Feeding: Maximal assistance;Bed level ?  ?Grooming: Maximal assistance;Sitting ?  ?Upper Body Bathing: Total assistance;Bed level ?  ?Lower Body Bathing: Total assistance;Bed level ?  ?Upper Body Dressing : Maximal assistance;Bed level ?Upper Body Dressing  Details (indicate cue type and  reason): to don new gown ?Lower Body Dressing: Total assistance;Bed level ?Lower Body Dressing Details (indicate cue type and reason): to don sock ?Toilet Transfer: Total assistance;+2 for physical assistance;Squat-pivot;BSC/3in1 ?  ?Toileting- Clothing Manipulation and Hygiene: Total assistance;Sit to/from stand;+2 for physical assistance ?  ?  ?  ?Functional mobility during ADLs: Maximal assistance;Total assistance;+2 for physical assistance ?  ?  ? ?Extremity/Trunk Assessment Upper Extremity Assessment ?Upper Extremity Assessment: LUE deficits/detail ?RUE Coordination: decreased fine motor;decreased gross motor ?LUE Deficits / Details: paretic, held in elbow flexion, adduction and supination. able to bring digits into composite flexion/extension with increased time, tone noted ?LUE Coordination: decreased fine motor;decreased gross motor ?  ?Lower Extremity Assessment ?Lower Extremity Assessment: Defer to PT evaluation ?RLE Deficits / Details: AKA, moves against gravity ?LLE Deficits / Details: lower tone, moves weakly in synergy,   Add/IR weaker than abd/ER, though geneeralized weakness overall, ?LLE Coordination: decreased gross motor ?  ?Cervical / Trunk Assessment ?Cervical / Trunk Assessment: Kyphotic (sits in posterior tilt.) ?  ? ?Vision   ?Vision Assessment?: Vision impaired- to be further tested in functional context ?Additional Comments: pt able to visually track L and R horizontally, unable to track into R/L Upper and lower quadrants, decreased task attention limiting visual assessment ?  ?Perception Perception ?Perception: Not tested ?  ?Praxis Praxis ?Praxis: Not tested ?Praxis Impairment Details: Motor planning ?  ? ?Cognition Arousal/Alertness: Awake/alert ?Behavior During Therapy: Flat affect ?Overall Cognitive Status: No family/caregiver present to determine baseline cognitive functioning ?  ?  ?  ?  ?  ?  ?  ?  ?  ?  ?  ?  ?  ?  ?  ?  ?General Comments: pt is not oriented to date, time, place  without cues, or situation.  Halting, tangential speech/communication. Follows single step commands with increased time. Perseverative on wanting to have private conversation with her sister. ?  ?  ?   ?Exercises   ? ?  ?Shoulder Instructions   ? ? ?  ?General Comments BP WNL post transfer to chair  ? ? ?Pertinent Vitals/ Pain       Pain Assessment ?Pain Assessment: Faces ?Pain Score: 4  ?Faces Pain Scale: Hurts little more ?Pain Location: "nausea pain" ?Pain Descriptors / Indicators: Discomfort ?Pain Intervention(s): Limited activity within patient's tolerance, Monitored during session, Repositioned ? ?Home Living   ?  ?  ?  ?  ?  ?  ?  ?  ?  ?  ?  ?  ?  ?  ?  ?  ?  ?  ? ?  ?Prior Functioning/Environment    ?  ?  ?  ?   ? ?Frequency ? Min 3X/week  ? ? ? ? ?  ?Progress Toward Goals ? ?OT Goals(current goals can now be found in the care plan section) ? Progress towards OT goals: Progressing toward goals ? ?Acute Rehab OT Goals ?Patient Stated Goal: to speak with her sister ?OT Goal Formulation: With patient ?Time For Goal Achievement: 06/24/21 ?Potential to Achieve Goals: Fair ?ADL Goals ?Additional ADL Goal #1: Pt will complete all aspects of bed mobility with mod A as a precursor to seated ADL's. ?Additional ADL Goal #2: Pt will follow 1 step commands for 50% of session. ?Additional ADL Goal #3: Pt will attend to 1 task for 3 mins with min multimodal cuing.  ?Plan Discharge plan remains appropriate;Frequency needs to be updated   ? ?Co-evaluation ? ? ?  PT/OT/SLP Co-Evaluation/Treatment: Yes ?Reason for Co-Treatment: Complexity of the patient's impairments (multi-system involvement);Necessary to address cognition/behavior during functional activity;For patient/therapist safety;To address functional/ADL transfers ?  ?OT goals addressed during session: ADL's and self-care;Strengthening/ROM ?  ? ?  ?AM-PAC OT "6 Clicks" Daily Activity     ?Outcome Measure ? ? Help from another person eating meals?: A Lot ?Help from  another person taking care of personal grooming?: A Lot ?Help from another person toileting, which includes using toliet, bedpan, or urinal?: Total ?Help from another person bathing (including washing, rinsing,

## 2021-06-16 NOTE — Progress Notes (Signed)
Speech Language Pathology Treatment: Dysphagia  ?Patient Details ?Name: Cassandra Andrade ?MRN: [id number] ?DOB: Mar 26, 1965 ?Today's Date: 06/16/2021 ?Time: 1211-1225 ?SLP Time Calculation (min) (ACUTE ONLY): 14 min ? ?Assessment / Plan / Recommendation ?Clinical Impression ? Pt was seen for reassessment per MD request, as pt is reportedly more alert than she has been. Pt was upright in her chair for advanced PO trials. She does not make much of an attempt at mastication when presented with small bites of solid foods. She lets the food sit in her mouth, sometimes appearing to suck on it, but ultimately showing SLP that it is sitting almost fully formed still on her tongue. She appeared to swallow a fairly solid bite x1, facilitated on subsequent boluses by liquid washes provided by SLP, but still without attempts to masticate. Pt says that she has dentures but that they are at home. Question if she could be appropriate for diet advancement if someone can bring in her dentures, but when considering current presentation and mentation, would leave on Dys 1 diet and thin liquids until she can be reassessed with dentures. ?  ?HPI HPI: Per MD "56 year old Female.  Past medical history of stroke, seizures, SAH, DVT/PE (warfarin presented with altered mental status. had a medication change 1 week ago.  Pt seen by neurology and diagnosed with encephalopathy. 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 There is partial consolidative  changes of the right lower lobe consistent with atelectasis or  pneumonia. 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  ?   ?SLP Plan ? Continue with current plan of care ? ?  ?  ?Recommendations for follow up therapy are  one component of a multi-disciplinary discharge planning process, led by the attending physician.  Recommendations may be updated based on patient status, additional functional criteria and insurance authorization. ?  ? ?Recommendations  ?Diet recommendations: Dysphagia 1 (puree);Thin liquid ?Liquids provided via: Cup;Straw ?Medication Administration: Crushed with puree ?Supervision: Full supervision/cueing for compensatory strategies;Trained caregiver to feed patient;Staff to assist with self feeding ?Compensations: Slow rate;Small sips/bites;Minimize environmental distractions ?Postural Changes and/or Swallow Maneuvers: Seated upright 90 degrees  ?   ?    ?   ? ? ? ? Oral Care Recommendations: Oral care BID ?Follow Up Recommendations: Home health SLP ?Assistance recommended at discharge: Frequent or constant Supervision/Assistance ?SLP Visit Diagnosis: Dysphagia, oropharyngeal phase (R13.12) ?Plan: Continue with current plan of care ? ? ? ? ?  ?  ? ? ?Osie Bond., M.A. CCC-SLP ?Acute Rehabilitation Services ?Office [phone] ? ?Secure chat preferred ? ? ?06/16/2021, 1:06 PM ?

## 2021-06-16 NOTE — Evaluation (Signed)
Physical Therapy Re-Evaluation ?Patient Details ?Name: Cassandra Andrade ?MRN: [id number] ?DOB: April 28, 1965 ?Today's Date: 06/16/2021 ? ?History of Present Illness ? Pt is a 56 y.o. female admitted 4/18 with acute metabolic encephalopathy. 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 Pt still encephalopathic, tangential and halting of thought.  Emphasis today on trying to "lock in" PLOF and equipment needs.  Worked on transitions to EOB, sitting balance, transfer via squat/scoot pivot and multiple sit to stands for peri care from chair level. ?   ?   ? ?Recommendations for follow up therapy are one component of a multi-disciplinary discharge planning process, led by the attending physician.  Recommendations may be updated based on patient status, additional functional criteria and insurance authorization. ? ?Follow Up Recommendations Home health PT (PCA, maxed out equipment/services) ? ?  ?Assistance Recommended at Discharge Frequent or constant Supervision/Assistance  ?Patient can return home with the following ? A lot of help with bathing/dressing/bathroom;Direct supervision/assist for medications management;Assist for transportation;Help with stairs or ramp for entrance;A lot of help with walking and/or transfers;Assistance with feeding ? ?  ?Equipment Recommendations Other (comment) (Hoyer/pading, bed pads for leverage and incontinence)  ?Recommendations for Other Services ?    ?  ?Functional Status Assessment Patient has had a recent decline in their functional status and/or demonstrates limited ability to make significant improvements in function in a reasonable and predictable amount of time  ? ?  ?Precautions / Restrictions Precautions ?Precautions: Fall  ? ?  ? ?Mobility ? Bed Mobility ?Overal bed mobility: Needs Assistance ?Bed Mobility: Rolling, Sidelying to Sit ?Rolling: Total assist, +2 for physical assistance, +2 for  safety/equipment ?Sidelying to sit: Total assist, +2 for physical assistance, +2 for safety/equipment ?  ?  ?  ?General bed mobility comments: cued/assist to normalize roll R to L, assisted L LE off bed and 2 person assist up via L UE/elbow, positioned R UE to assist optimally.  Used padding to assist asymmetrical scoot to EOB. ?  ? ?Transfers ?Overall transfer level: Needs assistance ?Equipment used: None ?Transfers: Sit to/from Stand, Bed to chair/wheelchair/BSC ?Sit to Stand: +2 physical assistance, Max assist, Total assist ?  ?  ?Squat pivot transfers: Total assist, +2 physical assistance ?  ?  ?General transfer comment: Noted pasty stool present during transfer to the chair.  sit to stands from chair x5 during pericare and pad changes with face to face /lateral assist. ?  ? ?Ambulation/Gait ?  ?  ?  ?  ?  ?  ?  ?General Gait Details: Not Able ? ?Stairs ?  ?  ?  ?  ?  ? ?Wheelchair Mobility ?  ? ?Modified Rankin (Stroke Patients Only) ?  ? ?  ? ?Balance Overall balance assessment: Needs assistance ?Sitting-balance support: Single extremity supported, Feet supported ?Sitting balance-Leahy Scale: Poor (to zero) ?Sitting balance - Comments: sits into posterior tilt with general bias posteriorly and to the L.  Sat EOB 10 min working on truncal activation, midline sitting/orientation with basic visual testing.  Pt unable to divide attention on 2 tasks in sitting. ?  ?  ?Standing balance-Leahy Scale: Zero ?Standing balance comment: complete block of L LE in stance. ?  ?  ?  ?  ?  ?  ?  ?  ?  ?  ?  ?   ? ? ? ?Pertinent Vitals/Pain Pain Assessment ?Pain Assessment: Faces ?Faces Pain Scale: Hurts little more ?Pain Location: "  nausea pain" ?Pain Descriptors / Indicators: Discomfort ?Pain Intervention(s): Monitored during session, Limited activity within patient's tolerance, Other (comment) (nausea IV meds)  ? ? ?Home Living   ?  ?  ?  ?  ?  ?  ?  ?  ?  ?   ?  ?Prior Function Prior Level of Function : Needs assist ?  ?  ?   ?  ?  ?  ?Mobility Comments: Recent decline over last couple of months resulting in pt being primarily bedridden, total assist for last 2-3 weeks. Prior to decline, pt mobilized mod I by scooting around the house, including scooting up/down the stairs. Wheelchair used in community. ?  ?  ? ? ?Hand Dominance  ? Dominant Hand: Right ? ?  ?Extremity/Trunk Assessment  ? Upper Extremity Assessment ?Upper Extremity Assessment: LUE deficits/detail ?LUE Deficits / Details: paretic, held in elbow flexion, adduction and supination. ?LUE Coordination: decreased fine motor;decreased gross motor ?  ? ?Lower Extremity Assessment ?Lower Extremity Assessment: LLE deficits/detail ?RLE Deficits / Details: AKA, moves against gravity ?LLE Deficits / Details: lower tone, moves weakly in synergy,   Add/IR weaker than abd/ER, though geneeralized weakness overall, ?LLE Coordination: decreased gross motor ?  ? ?Cervical / Trunk Assessment ?Cervical / Trunk Assessment: Kyphotic (sits in posterior tilt.)  ?Communication  ? Communication: Expressive difficulties  ?Cognition Arousal/Alertness: Awake/alert ?Behavior During Therapy: Flat affect ?Overall Cognitive Status: No family/caregiver present to determine baseline cognitive functioning ?  ?  ?  ?  ?  ?  ?  ?  ?  ?  ?  ?  ?  ?  ?  ?  ?General Comments: pt is not oriented to date, time, place without cues, or situation.  Halting, tangential speech/communication ?  ?  ? ?  ?General Comments General comments (skin integrity, edema, etc.): vss on RA, SpO2 100%, stable BP, HR in upper 90's lower 100's bpm ? ?  ?Exercises Other Exercises ?Other Exercises: basic warm up of L LE, ?Other Exercises: AA/PROM to L UE  ? ?Assessment/Plan  ?  ?PT Assessment Patient needs continued PT services  ?PT Problem List Decreased strength;Decreased activity tolerance;Decreased balance;Decreased mobility;Decreased coordination;Decreased cognition;Decreased knowledge of use of DME;Decreased safety awareness;Impaired  tone;Pain ? ?   ?  ?PT Treatment Interventions Therapeutic activities;Therapeutic exercise;Patient/family education;Balance training;Functional mobility training   ? ?PT Goals (Current goals can be found in the Care Plan section)  ?Acute Rehab PT Goals ?Patient Stated Goal: home per mother ?PT Goal Formulation: With family ?Time For Goal Achievement: 06/25/21 ?Potential to Achieve Goals: Fair ? ?  ?Frequency Min 2X/week (work toward 3x /wk) ?  ? ? ?Co-evaluation   ?  ?  ?  ?  ? ? ?  ?AM-PAC PT "6 Clicks" Mobility  ?Outcome Measure Help needed turning from your back to your side while in a flat bed without using bedrails?: Total ?Help needed moving from lying on your back to sitting on the side of a flat bed without using bedrails?: Total ?Help needed moving to and from a bed to a chair (including a wheelchair)?: Total ?Help needed standing up from a chair using your arms (e.g., wheelchair or bedside chair)?: Total ?Help needed to walk in hospital room?: Total ?Help needed climbing 3-5 steps with a railing? : Total ?6 Click Score: 6 ? ?  ?End of Session   ?Activity Tolerance: Patient tolerated treatment well;Other (comment) (limited by lower attention and nausea) ?Patient left: in chair;with call bell/phone within reach;with chair alarm  set;Other (comment) (maximove padding) ?Nurse Communication: Mobility status ?PT Visit Diagnosis: Other abnormalities of gait and mobility (R26.89);Muscle weakness (generalized) (M62.81) ?  ? ?Time: 1110-1204 ?PT Time Calculation (min) (ACUTE ONLY): 54 min ? ? ?Charges:   PT Evaluation ?$PT Re-evaluation: 1 Re-eval ?PT Treatments ?$Therapeutic Activity: 8-22 mins ?  ?   ? ? ?06/16/2021 ? ?Ginger Carne., PT ?Acute Rehabilitation Services ?[phone]  (pager) ?[phone]  (office) ? ?Tessie Fass Guinevere Stephenson ?06/16/2021, 12:33 PM ? ?

## 2021-06-16 NOTE — Progress Notes (Addendum)
?PROGRESS NOTE ? ? ? ?Cassandra Andrade  YHC:[id number] DOB: June 05, 1965 DOA: 06/09/2021 ?PCP: Berkley Harvey, NP  ? ? ?Brief Narrative:  ?Patient is a 56 y.o. 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  In the ED, patient was noted to be hypernatremic with acute kidney injury and UTI.  Patient was also seen by telemetry neurology who made changes to seizure medication regimen.  Patient was then considered for admission to the hospital. ? ?Assessment and Plan: ?Principal Problem: ?  Acute metabolic encephalopathy ?Active Problems: ?  Sepsis ([place]) ?  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  UTI (urinary tract infection) ?  Hypothyroid ?  AKI (acute kidney injury) ([place]) ?  Depression ?  DVT, bilateral lower limbs ([place]) ?  Supratherapeutic INR ?  Elevated CK ?  Dehydration ?  Hypernatremia ?  S/P BKA (below knee amputation) ([place]) ?  Seizure ([place]) ?  Lupus anticoagulant disorder ([place]) ?  Cerebrovascular accident, late effects ?  Personal history of osteosarcoma ?  Aspiration pneumonia ([place]) ?  Constipation ?  Protein-calorie malnutrition, severe ?  ?Acute metabolic encephalopathy ?Likely multifactorial secondary to UTI, dehydration and hypernatremia and possible seizures..   Neurology on board. Received  EEG monitoring.  EEG reported 06/13/21 shows evidence of epileptogenicity arising from the right frontocentral region.  Currently on IV briviact '100mg'$  BID,  lamotrignine '350mg'$  BID, and vimpat '200mg'$  bid. MRI of the brain done 06/15/2021 without any acute changes.  Mild improvement in encephalopathy. ? ?Rule out seizures and status epilepticus.  Received continuous  EEG.  On brivaracetam, Vimpat.  Continue to hold venlafaxine and olanzapine.   MRI of the brain without any acute findings..  Patient does have a VNS.  Neurology on board, follow recommendations ? ?Sepsis ([place]) secondary to UTI. ?Patient had  fever with tachycardia and urinalysis showed  UTI.  Received IV Unasyn and has completed course 5-day course.  Urine culture showing multiple species at this time.  Blood cultures negative in 4 days.   No leukocytosis or fever at this time. ?  ?S/P BKA (below knee amputation) ([place]) ?Chronic stable ?  ?Hypothyroidism ?On IV Synthroid at this time.  Resume oral when mental status improves. On dysphagia 1 diet at this time. ?   ?DVT, bilateral lower limbs ([place]) ?-Presented with supratherapeutic INR.  On Coumadin as outpatient..  INR of 2.5 today.  Pharmacy to manage.  ?  ?Lupus anticoagulant disorder ([place]) ?On Coumadin as outpatient.   Pharmacy to manage.   ?  ?Personal history of osteosarcoma ?Status post right BKA. ?  ?Supratherapeutic INR ?Patient presented with INR of 7.6.   Pharmacy managing. ?  ?Hypernatremia ?On D5 water, rate has been decreased to 50 mill per hour..  Latest sodium of 138. ? ?Mild hypokalemia.  Improved after replacement.  Latest potassium of 3.7 ? ?Dehydration ?Patient with decreased oral intake at home.  On D5 water at this time..  On dysphagia 1 diet at this time. ?  ?Elevated CK, likely mild rhabdomyolysis. ?Improved. ?  ?AKI (acute kidney injury) ([place]) ?Improved.  Likely secondary to volume depletion.  Latest creatinine  at 0.9 ? ?Depression ?On Effexor, Zyprexa at home.  Resume when appropriate.  Currently on hold. ?  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  Currently on IV AED. ?  ?Aspiration pneumonia ([place]) ?Likely secondary to metabolic encephalopathy.  Completed Unasyn.    Speech therapy on board and recommend dysphagia 1 diet at this time ? ?Severe protein calorie malnutrition. ?Present on admission.  Seen by dietitian.  On Ensure  supplements ? ?Goals of care.  Palliative care on board and plan is to continue with full scope of treatment. ?   ? ? DVT prophylaxis: SCDs Start: 06/10/21 0036 ? ? ?Code Status:   ?  Code Status: Full Code ? ?Disposition: Home with home Health as per PT likely 1 to 2 days., pending clinical improvement. ? ?Status is: Inpatient ? ?Remains inpatient appropriate because:  Ongoing metabolic encephalopathy, deconditioning debility, IV AED, pending clinical improvement, ? ? Family Communication: ?Spoke with the patient's mother on the phone on 06/12/2021 and again today on the phone and updated her about the clinical condition of the patient. ? ?Consultants:  ?Neurology ?Palliative care ? ?Procedures:  ?Continuous EEG ? ?Antimicrobials:  ?Unasyn 4/19> ? ?Anti-infectives (From admission, onward)  ? ? Start     Dose/Rate Route Frequency Ordered Stop  ? 06/10/21 2300  cefTRIAXone (ROCEPHIN) 2 g in sodium chloride 0.9 % 100 mL IVPB  Status:  Discontinued       ? 2 g ?200 mL/hr over 30 Minutes Intravenous Every 24 hours 06/09/21 2325 06/10/21 0123  ? 06/10/21 1600  Ampicillin-Sulbactam (UNASYN) 3 g in sodium chloride 0.9 % 100 mL IVPB       ? 3 g ?200 mL/hr over 30 Minutes Intravenous Every 6 hours 06/10/21 0953 06/15/21 2253  ? 06/10/21 0200  Ampicillin-Sulbactam (UNASYN) 3 g in sodium chloride 0.9 % 100 mL IVPB  Status:  Discontinued       ? 3 g ?200 mL/hr over 30 Minutes Intravenous Every 8 hours 06/10/21 0129 06/10/21 0953  ? 06/09/21 2300  cefTRIAXone (ROCEPHIN) 1 g in sodium chloride 0.9 % 100 mL IVPB       ? 1 g ?200 mL/hr over 30 Minutes Intravenous  Once 06/09/21 2247 06/09/21 2329  ? ?  ? ?Subjective: ?Today, patient was seen and examined at bedside.  Appears to be more alert awake and communicative.  Denies any pain, nausea, vomiting or fever. ? ?Objective: ?Vitals:  ? 06/15/21 2000 06/15/21 2348 06/16/21 0400 06/16/21 0735  ?BP: 116/80 137/72 137/68 114/82  ?Pulse: 90 95 78 98  ?Resp: '18 16 11 20  '$ ?Temp: 98.6 ?F  (37 ?C) 99.1 ?F (37.3 ?C) 98.3 ?F (36.8 ?C)   ?TempSrc: Oral Oral Axillary   ?SpO2: 99% 100% 95% 90%  ?Weight:      ?Height:      ? ? ?Intake/Output Summary (Last 24 hours) at 06/16/2021 1026 ?Last data filed at 06/16/2021 0900 ?Gross per 24 hour  ?Intake 2120.36 ml  ?Output 2450 ml  ?Net -329.64 ml  ? ?Filed Weights  ? 06/13/21 0452  ?Weight: 76.6 kg  ? ? ?Physical Examination: ? ?General:  Average built, not in obvious distress, more alert awake and communicative today.  Speech more distinct and less mumbled.[name] ?HENT:   No scleral pallor or icterus noted. Oral mucosa is moist.  ?Chest:  Diminished breath sounds bilaterally. No crackles or wheezes.  ?CVS: S1 &S2 heard. No murmur.  Regular rate and rhythm. ?Abdomen: Soft, nontender, nondistended.  Bowel sounds are heard.   ?Extremities: No cyanosis, clubbing or edema.  Peripheral  pulses are palpable. ?Psych: Alert, awake and communicative. ?CNS: No distinct speech, oriented to place and person, disoriented to month, follows commands. ?Skin: Warm and dry.  No rashes noted. ? ? ?Data Reviewed:  ? ?CBC: ?Recent Labs  ?Lab 06/09/21 ?2216 06/10/21 ?0347 06/11/21 ?0250 06/12/21 ?4259 06/13/21 ?0106 06/15/21 ?0302 06/15/21 ?1539  ?WBC 9.0   < > 7.4 5.8 5.6 6.1 5.5  ?NEUTROABS 7.2  --   --   --   --   --   --   ?HGB 11.9*   < > 9.9* 10.4* 9.6* 9.2* 9.9*  ?HCT 38.5   < > 32.4* 32.7* 29.0* 29.4* 30.6*  ?MCV 92.3   < > 92.0 87.9 87.3 88.6 87.4  ?PLT 229   < > 199 214 237 289 325  ? < > = values in this interval not displayed.  ? ? ?Basic Metabolic Panel: ?Recent Labs  ?Lab 06/09/21 ?2216 06/10/21 ?5638 06/10/21 ?1655 06/12/21 ?7564 06/13/21 ?0106 06/14/21 ?3329 06/15/21 ?0302 06/16/21 ?0248  ?NA 151* 150*   < > 138 139 136 134* 138  ?K 4.0 3.9   < > 3.0* 3.2* 4.2 3.5 3.7  ?CL 115* 119*   < > 102 102 97* 96* 98  ?CO2 25 25   < > '28 28 29 30 '$ 33*  ?GLUCOSE 132* 127*   < > 138* 147* 113* 135* 148*  ?BUN 29* 24*   < > 5* <5* 5* <5* <5*  ?CREATININE 1.90* 1.40*   < > 0.70 0.90 0.84  0.88 0.97  ?CALCIUM 9.1 8.1*   < > 8.3* 8.4* 9.0 8.8* 9.2  ?MG 2.2 2.0  --  1.8 1.8  --  1.9 1.9  ?PHOS 4.5 3.1  --   --   --   --   --   --   ? < > = values in this interval not displayed.  ? ? ?Liver Function Te

## 2021-06-16 NOTE — Progress Notes (Signed)
?                                                   ?Palliative Care Progress Note, Assessment & Plan  ? ?Patient Name: Cassandra Andrade       Date: 06/16/2021 ?DOB: Dec 04, 1965  Age: 56 y.o. MRN#: [id number] ?Attending Physician: Flora Lipps, MD ?Primary Care Physician: Berkley Harvey, NP ?Admit Date: 06/09/2021 ? ?Reason for Consultation/Follow-up: Establishing goals of care ? ?Subjective: ?Patient is sitting up in bed and being fed her breakfast.  She acknowledges my presence and greets me as I enter the room.  She is able to make her wishes known.  She has no acute complaints at this time.  No family at bedside. ? ?HPI: ?56 y.o. female  with past medical history of seizure disorder, stroke r/t aneurysm rupture s/p VNS placement, hx of DVT, Lupus (Coumadin), hx of osteosarcoma s/p right AKA, and depression admitted on 06/09/2021 with confusion, combative behavior, and poor PO intake.  ?  ?Pt is being treated for UTI, supratherapuetic INR, aspiration PNA, and elevated CK.  ?  ?LTM EEG and MRI have been completed. No evidence of seizure on EEG and no evidence of stroke on MRI. ? ?Plan as per neuro is:  ?- Continue Briviact '100mg'$  BID, Lacosamide '200mg'$  BID, lamotrigine '350mg'$  BID. Can switch to PO when able ?- Hold olanzapine for now, can resume slowly if needed ?- VNS was turned off for MRI and turned back to previous settings ?- Continue sz precautions ?- PRN IV ativan '2mg'$  for clinical sz ?- Discussed plan with mother at bedside ?-Follow-up with neurology in 2 to 3 months after discharge ? ?Summary of counseling/coordination of care: ?After reviewing the patient's chart, assessed patient at bedside.  She is able to have more linear and meaningful conversation with me today in comparison to yesterday.  Her voice remains weak and she is always speaking in a whisper.  However, she is  more interactive and able to have more discussions beyond who she is, where she is, and why she is in the hospital.  We discussed that the plan is for her to go home with her mother is her primary caregiver and additional home health support.  Patient is full agreement with this plan and continually repeats that she just wants to go home. ? ?Discussed that as patient's mental status improves she is able to make decisions for herself.  However, in the event that patient is unable to make decisions for herself she defers to her mother as her surrogate decision-maker. ? ?Patient has eaten almost 100% of her breakfast.  I shared that this is a drastic improvement from yesterday's intake of approximately 10% of her meals.  Patient smiles and says she is happy to be eating a full meal. ? ?Discussed importance of not only nutrition but mobilization to assist with patient's rehab.  Patient shares she would like to be more active and do what ever she can to get better and get home. ? ?Goals are clear.  Full code and full scope of treatment.  Plan set for patient to go home with home health and mother's primary caregiver. ? ?Questions and concerns were addressed.  PMT will continue to follow the patient throughout her hospitalization.  PMT will shadow the patient's chart and intervene if patient/family requests, patient's health  declines, or if goals change. ? ?Code Status: ?Full code ? ?Prognosis: ?Unable to determine ? ?Discharge Planning: ?Home with Palliative Services ? ?Recommendations/Plan: ?Outpatient palliative to follow at discharge ?Encourage mobilization as tolerated - pt refused PT - and PO intake ? ?Care plan was discussed with patient, RN ? ?Physical Exam ?Vitals reviewed.  ?HENT:  ?   Mouth/Throat:  ?   Mouth: Mucous membranes are moist.  ?Eyes:  ?   Pupils: Pupils are equal, round, and reactive to light.  ?   Comments: Favors right gaze  ?Cardiovascular:  ?   Rate and Rhythm: Normal rate.  ?   Pulses: Normal  pulses.  ?Pulmonary:  ?   Effort: Pulmonary effort is normal.  ?Abdominal:  ?   Palpations: Abdomen is soft.  ?Musculoskeletal:  ?   Comments: RUE and RLE weakness, L AKA  ?Skin: ?   General: Skin is warm and dry.  ?Neurological:  ?   Mental Status: She is alert and oriented to person, place, and time.  ?Psychiatric:     ?   Mood and Affect: Mood normal.     ?   Behavior: Behavior normal.     ?   Thought Content: Thought content normal.     ?   Judgment: Judgment normal.  ?         ? ?Palliative Assessment/Data: 40% ? ? ? ?Total Time 25 minutes  ?Greater than 50%  of this time was spent counseling and coordinating care related to the above assessment and plan. ? ?Thank you for allowing the Palliative Medicine Team to assist in the care of this patient. ? ?Verdell Carmine. Zi Sek, DNP, FNP-BC ?Palliative Medicine Team ?Team Phone # [phone] ?  ?

## 2021-06-17 LAB — PROTIME-INR
INR: 2.5 — ABNORMAL HIGH (ref 0.8–1.2)
Prothrombin Time: 26.3 seconds — ABNORMAL HIGH (ref 11.4–15.2)

## 2021-06-17 NOTE — Progress Notes (Signed)
?                                                   ?Palliative Care Progress Note, Assessment & Plan  ? ?Patient Name: Cassandra Andrade       Date: 06/17/2021 ?DOB: 18-Jul-1965  Age: 56 y.o. MRN#: [id number] ?Attending Physician: Mariel Aloe, MD ?Primary Care Physician: Berkley Harvey, NP ?Admit Date: 06/09/2021 ? ?Reason for Consultation/Follow-up: Establishing goals of care ? ?Subjective: ?Patient is lying in bed in no apparent distress.  She acknowledges my presence and is able to make her wishes known.  She says she is nauseous and does not really feel like eating when she feels sick on her stomach.  No family at bedside presently. ? ?HPI: ?56 y.o. female  with past medical history of seizure disorder, stroke r/t aneurysm rupture s/p VNS placement, hx of DVT, Lupus (Coumadin), hx of osteosarcoma s/p right AKA, and depression admitted on 06/09/2021 with confusion, combative behavior, and poor PO intake.  ?  ?Pt is being treated for UTI, supratherapuetic INR, aspiration PNA, and elevated CK.  ?  ?LTM EEG and MRI have been completed. No evidence of seizure on EEG and no evidence of stroke on MRI. ?  ?Plan as per neuro is:  ?- Continue Briviact '100mg'$  BID, Lacosamide '200mg'$  BID, lamotrigine '350mg'$  BID. Can switch to PO when able ?- Hold olanzapine for now, can resume slowly if needed ?- VNS was turned off for MRI and turned back to previous settings ?- Continue sz precautions ?- PRN IV ativan '2mg'$  for clinical sz ?- Discussed plan with mother at bedside ?-Follow-up with neurology in 2 to 3 months after discharge ? ?Summary of counseling/coordination of care: ?I received a secure chat from registered dietitian with concerns that patient was no longer having p.o. intake.   ? ?After reviewing the patient's chart and assessing the patient at bedside, I spoke with patient's nurse and patient  regarding patient refusing p.o. intake.  Discussed with patient that adequate intake orally is important for her recovery.  When asked why she was not eating her meals, she said she was feeling nauseous but it was okay since she was going home.  Counseled with RN who is going to give IV Zofran and then attempt to give Ensure today. Patient in agreement with plan.  ? ?Reviewed with patient that if she is not able to keep up adequately with caloric intake by mouth then we would need to discuss alternate means of hydration and nutrition.  I reviewed that this would involve a feeding tube. I briefly reviewed feeding tube risks and benefits. Before I completed my thoughts, patient's eyes got big, and she was very clear when she said strongly "no no no". When asked if she would want a temporary feeding tube to bridge her to being able to eat/drink on her own, she said no. When asked if she would want a feeding tube in order to sustain her life she said no. ? ?PMT will continue to follow the patient throughtout her hospitalization. Pt may be discharged today and outpatient palliative should continue to follow the patient and continue [place] and ACP discussions.  ? ?Code Status: ?Full code ? ?Prognosis: ?Unable to determine ? ?Discharge Planning: ?Home with Palliative Services ? ?Care plan was discussed with patient, RN, RD ? ?Physical Exam ?  Vitals reviewed.  ?Constitutional:   ?   General: She is not in acute distress. ?   Appearance: She is not ill-appearing.  ?HENT:  ?   Head: Normocephalic and atraumatic.  ?   Mouth/Throat:  ?   Mouth: Mucous membranes are moist.  ?Eyes:  ?   Pupils: Pupils are equal, round, and reactive to light.  ?   Comments: Favors right gaze  ?Cardiovascular:  ?   Rate and Rhythm: Normal rate.  ?   Pulses: Normal pulses.  ?Pulmonary:  ?   Effort: Pulmonary effort is normal.  ?Abdominal:  ?   Palpations: Abdomen is soft.  ?Musculoskeletal:  ?   Comments: Generalized weakness  ?Skin: ?   General: Skin  is warm.  ?Neurological:  ?   Mental Status: She is alert.  ?   Comments: Oriented to self and place  ?Psychiatric:     ?   Mood and Affect: Mood normal.     ?   Behavior: Behavior normal.     ?   Thought Content: Thought content normal.     ?   Judgment: Judgment normal.  ?         ? ?Palliative Assessment/Data: 30% ? ? ? ?Total Time 25 minutes  ?Greater than 50%  of this time was spent counseling and coordinating care related to the above assessment and plan. ? ?Thank you for allowing the Palliative Medicine Team to assist in the care of this patient. ? ?Verdell Carmine. Eulalie Speights, DNP, FNP-BC ?Palliative Medicine Team ?Team Phone # [phone] ?  ?

## 2021-06-17 NOTE — Progress Notes (Signed)
Nutrition Follow-up ? ?DOCUMENTATION CODES:  ?Severe malnutrition in context of acute illness/injury ? ?INTERVENTION:  ?Continue current diet as ordered, advance as able per SLP recommendations. Encourage PO intake ?Continue Ensure Enlive po BID, each supplement provides 350 kcal and 20 grams of protein. ?continue Mighty Shake po TID, each supplement provides 330 kcal and 9 grams of protein ?MVI with minerals daily ?Note - pt declined feeding tube now or in the future per discussion with palliative care ? ?NUTRITION DIAGNOSIS:  ?Severe Malnutrition related to acute illness (seizures/status epilepticus vs NMS) as evidenced by moderate muscle depletion, moderate fat depletion, energy intake < or equal to 50% for > or equal to 5 days. ?- ongoing  ? ?GOAL:  ?Patient will meet greater than or equal to 90% of their needs ?- not progressing, poor intake. Supplements in place ? ?MONITOR:  ?PO intake, Supplement acceptance, Labs ? ?REASON FOR ASSESSMENT:  ?Consult ?Assessment of nutrition requirement/status ? 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 ? ?Pt resting in bed at the time of assessment. Awake and alert, but speech is very quiet and difficult to understand. Noted that pt ate well yesterday, but is back to refusing meals this AM. Noted breakfast tray at bedside untouched.  ? ?Discussed with RN, pt is going home today with her mother and reports that intake is substantially better when she is present. Talked to pt about the importance of nutrition and encouraged her to eat while she was at the hospital and to try and resume an adequate eating patter once she returns home.   ? ?Did discuss poor intake with palliative care who has been working with family. NP talked about the importance of  nutrition in her recovery during her visit and broached the topic of artifical feeding. Pt verbalized that she would not want a feeding tube now or to be sustained with one in the future. If pt were to be hospitalized in the future, could re-address if GOC changed.  ? ?Average Meal Intake: ?4/18-4/25: 34% intake x 4 recorded meals ? ?Nutritionally Relevant Medications: ?Scheduled Meds: ? Ensure Enlive  237 mL Oral BID BM  ? levothyroxine  25 mcg Intravenous Daily  ? multivitamin with minerals  1 tablet Oral Daily  ? thiamine injection  100 mg Intravenous Daily  ? Warfarin   Does not apply q1600  ? ?PRN Meds: bisacodyl, ondansetron ? ?Labs Reviewed ? ?NUTRITION - FOCUSED PHYSICAL EXAM: ?Flowsheet Row Most Recent Value  ?Orbital Region Moderate depletion  ?Upper Arm Region Moderate depletion  ?Thoracic and Lumbar Region Mild depletion  ?Buccal Region Moderate depletion  ?Temple Region Moderate depletion  ?Clavicle Bone Region Mild depletion  ?Clavicle and Acromion Bone Region Mild depletion  ?Scapular Bone Region Unable to assess  ?Dorsal Hand Unable to assess  ?Patellar Region Moderate depletion  ?Anterior Thigh Region Severe depletion  ?Posterior Calf Region Severe depletion  ?Edema (RD Assessment) None  ?Hair Unable to assess  ?Eyes Reviewed  ?Mouth Other (Comment)  [edentulous]  ?Skin Reviewed  ?Nails Reviewed  ? ?Diet Order:   ?Diet Order   ? ?       ?  DIET - DYS 1 Room service appropriate? No; Fluid consistency: Thin  Diet effective now       ?  ? ?  ?  ? ?  ? ? ?  EDUCATION NEEDS:  ?Not appropriate for education at this time ? ?Skin:  Skin Assessment: Reviewed RN Assessment ? ?Last BM:  4/26 - type 5 ? ?Height:  ?Ht Readings from Last 1 Encounters:  ?06/15/21 '5\' 6"'$  (1.676 m)  ? ? ?Weight:  ?Wt Readings from Last 1 Encounters:  ?06/13/21 76.6 kg  ? ? ?Ideal Body Weight:  54.36 kg (adjusted for AKA) ? ?BMI:  Body mass index is 27.26 kg/m?. ? ?Estimated Nutritional Needs:  ?Kcal:  1950-2150 ?Protein:  90-105  grams ?Fluid:  >1.9L ? ? ?Ranell Patrick, RD, LDN ?Clinical Dietitian ?RD pager # available in [place]  ?After hours/weekend pager # available in [place] ?

## 2021-06-17 NOTE — Plan of Care (Signed)

## 2021-06-17 NOTE — Progress Notes (Signed)
Physical Therapy Treatment ?Patient Details ?Name: Cassandra Andrade ?MRN: [id number] ?DOB: 04/14/1965 ?Today's Date: 06/17/2021 ? ? ?History of Present Illness Pt is a 56 y.o. female admitted 4/18 with acute metabolic encephalopathy. 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 ?  ?PT Comments  ? ? Continued progression of functional bed mobility and sitting balance. The patient is max-total A for all bed mobility and to come to sit EOB. She is fecally incontinent and requires total A for pericare. She was total A for partial stand and for lateral scooting towards HOB. Pt would benefit from continued PT services to maximize independence and further progress transfer ability. At this time recommending maximum home health services and PCA for safe transition home with mother as primary care taker. Plan and d/c recommendations remain unchanged. PT to follow up acutely as able.  ?   ?Recommendations for follow up therapy are one component of a multi-disciplinary discharge planning process, led by the attending physician.  Recommendations may be updated based on patient status, additional functional criteria and insurance authorization. ? ?Follow Up Recommendations ? Home health PT (PCA, max equipment services) ?  ?  ?Assistance Recommended at Discharge Frequent or constant Supervision/Assistance  ?Patient can return home with the following A lot of help with bathing/dressing/bathroom;Direct supervision/assist for medications management;Assist for transportation;Help with stairs or ramp for entrance;A lot of help with walking and/or transfers;Assistance with feeding ?  ?Equipment Recommendations ? Other (comment) (Hoyer/pading, bed pads for leverage and incontinence)  ?  ?Recommendations for Other Services   ? ? ?  ?Precautions / Restrictions Precautions ?Precautions: Fall ?Precaution Comments: R AKA ?Restrictions ?Weight Bearing Restrictions: No  ?   ? ?Mobility ? Bed Mobility ?Overal bed mobility: Needs Assistance ?Bed Mobility: Rolling, Sidelying to Sit, Supine to Sit ?Rolling: Max assist, +2 for safety/equipment ?Sidelying to sit: Total assist, +2 for safety/equipment ?Supine to sit: Total assist, +2 for safety/equipment ?  ?  ?General bed mobility comments: Significant sequencing cues both verbal and tactile for all bed mobility. She requires almost constant trunk support/control. Assisted scoot in bed. Pt was using R UE and L LE to help intermitently. ?  ? ?Transfers ?  ?  ?Transfers: Sit to/from Stand ?Sit to Stand: +2 physical assistance, Max assist, Total assist ?  ?  ?  ?  ? Lateral/Scoot Transfers: Total assist, +2 physical assistance, +2 safety/equipment ?General transfer comment: Total A for lateral scoot in bed, seqencing cues, and boost, total A +2 for partial STS for attempted pericare, pt unable to maintain stand. Blocking L knee, hip, and trunk support ?  ? ?Ambulation/Gait ?  ?  ?  ?  ?  ?  ?  ?  ? ? ?Stairs ?  ?  ?  ?  ?  ? ? ?Wheelchair Mobility ?  ? ?Modified Rankin (Stroke Patients Only) ?  ? ? ?  ?Balance Overall balance assessment: Needs assistance ?Sitting-balance support: Single extremity supported, Feet supported ?Sitting balance-Leahy Scale: Poor ?Sitting balance - Comments: requires constant trunk support ?Postural control: Posterior lean, Right lateral lean ?  ?Standing balance-Leahy Scale: Zero ?Standing balance comment: complete block of L LE in stance and trunk support. ?  ?  ?  ?  ?  ?  ?  ?  ?  ?  ?  ?  ? ?  ?Cognition Arousal/Alertness: Awake/alert ?Behavior During Therapy: Flat affect ?Overall Cognitive Status: No family/caregiver present to determine baseline cognitive  functioning ?Area of Impairment: Attention, Following commands, Safety/judgement, Awareness, Problem solving ?  ?  ?  ?  ?  ?  ?  ?  ?  ?Current Attention Level: Sustained ?  ?Following Commands: Follows multi-step commands inconsistently, Follows one step  commands inconsistently ?Safety/Judgement: Decreased awareness of deficits, Decreased awareness of safety ?Awareness: Intellectual ?Problem Solving: Slow processing, Decreased initiation, Requires verbal cues, Difficulty sequencing, Requires tactile cues ?General Comments: Pt requires significant cuing, gets fixated on tasks, and does not sequence tasks well. Requires verbal and tacticle cues for all mobility. Unaware she was sitting in feces during session. ?  ?  ? ?  ?Exercises   ? ?  ?General Comments General comments (skin integrity, edema, etc.): Pt can volitionally straighten L UE when cued. ?  ?  ? ?Pertinent Vitals/Pain Pain Assessment ?Pain Assessment: Faces ?Faces Pain Scale: Hurts a little bit ?Pain Location: General discomfort ?Pain Descriptors / Indicators: Discomfort ?Pain Intervention(s): Limited activity within patient's tolerance, Monitored during session  ? ? ?Home Living   ?  ?  ?  ?  ?  ?  ?  ?  ?  ?   ?  ?Prior Function    ?  ?  ?   ? ?PT Goals (current goals can now be found in the care plan section) Acute Rehab PT Goals ?Patient Stated Goal: home per mother ?PT Goal Formulation: With family ?Potential to Achieve Goals: Fair ?Progress towards PT goals: Progressing toward goals ? ?  ?Frequency ? ? ? Min 2X/week ? ? ? ?  ?PT Plan Current plan remains appropriate  ? ? ?Co-evaluation   ?  ?  ?  ?  ? ?  ?AM-PAC PT "6 Clicks" Mobility   ?Outcome Measure ? Help needed turning from your back to your side while in a flat bed without using bedrails?: Total ?Help needed moving from lying on your back to sitting on the side of a flat bed without using bedrails?: Total ?Help needed moving to and from a bed to a chair (including a wheelchair)?: Total ?Help needed standing up from a chair using your arms (e.g., wheelchair or bedside chair)?: Total ?Help needed to walk in hospital room?: Total ?Help needed climbing 3-5 steps with a railing? : Total ?6 Click Score: 6 ? ?  ?End of Session Equipment Utilized  During Treatment: Gait belt ?Activity Tolerance: Other (comment) (Treatment limited secondary to pt command following) ?Patient left: in bed;with call bell/phone within reach;with bed alarm set ?Nurse Communication: Mobility status ?PT Visit Diagnosis: Other abnormalities of gait and mobility (R26.89);Muscle weakness (generalized) (M62.81) ?Pain - Right/Left: Left ?Pain - part of body: Leg ?  ? ? ?Time: 8309-4076 ?PT Time Calculation (min) (ACUTE ONLY): 26 min ? ?Charges:  $Therapeutic Activity: 23-37 mins          ?          ? ?Thermon Leyland, SPT ?Acute Rehab Services ? ? ? ?Thermon Leyland ?06/17/2021, 3:42 PM ? ?

## 2021-06-17 NOTE — Discharge Summary (Signed)
?Physician Discharge Summary ?  ?Patient: Cassandra Andrade MRN: [id number] DOB: [birth year]  ?Admit date:     06/09/2021  ?Discharge date: 06/18/21  ?Discharge Physician: Cordelia Poche, MD  ? ?PCP: Berkley Harvey, NP  ? ?Recommendations at discharge:  ? ?Outpatient PCP and neurology follow-up ?Dysphagia 1 diet ? ?Discharge Diagnoses: ?Principal Problem: ?  Acute metabolic encephalopathy ?Active Problems: ?  Sepsis ([place]) ?  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  UTI (urinary tract infection) ?  Hypothyroid ?  AKI (acute kidney injury) ([place]) ?  Depression ?  DVT, bilateral lower limbs ([place]) ?  Supratherapeutic INR ?  Elevated CK ?  Dehydration ?  Hypernatremia ?  S/P BKA (below knee amputation) ([place]) ?  Seizure ([place]) ?  Lupus anticoagulant disorder ([place]) ?  Cerebrovascular accident, late effects ?  Personal history of osteosarcoma ?  Aspiration pneumonia ([place]) ?  Constipation ?  Protein-calorie malnutrition, severe ? ?Resolved Problems: ?  * No resolved hospital problems. * ? ?Hospital Course: ? ?Patient is a 56 y.o. 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  In the ED, patient was noted to be hypernatremic with acute kidney injury and UTI.  Patient was also seen by telemetry neurology who made changes to seizure medication regimen.  Patient was then considered for admission to the hospital. Although patient was treated with antibiotics, urine culture with multiple species. MRI without acute changes. LTM with evidence of epileptogenicity but no seizure activity identified. Encephalopathy improved prior to discharge. Home Effexor and Zyprexa held with  recommendations to discontinue Zyprexa on discharge.  ? ? ?Assessment and Plan: ? ?Acute metabolic encephalopathy ?Initially concerned for multifocal etiology secondary to UTI, dehydration, hypernatremia and seizures. MRI without acute process. Zyprexa held. Mental status improved to baseline prior to discharge. Neurology with recommendation to discontinue Zyprexa on discharge. ? ?Seizures ?Prior history. Neurology consulted. Long term EEG performed without evidence of seizures. Neurology recommendation to continue Briviact 100 mg BID, lacosamide 200 mg BID, lamotrigine 350 mg BID. Patient to follow-up with her outpatient neurologist in 2 months. ? ?Sepsis secondary to UTI ?Patient had  fever with tachycardia and urinalysis showed  UTI.  Received IV Unasyn and has completed course 5-day course.  Urine culture showing multiple species at this time.  Blood cultures with no growth. ?  ?S/P BKA (below knee amputation) ?Chronic stable ?  ?Hypothyroidism ?Continue Synthroid ?   ?DVT, bilateral lower limbs ?History of. Supratherapeutic INR on admission. Resume Coumadin on discharge. ?  ?Lupus anticoagulant disorder ?On Coumadin as outpatient.   Pharmacy to manage.   ?  ?Personal history of osteosarcoma ?Status post right BKA. ?  ?Supratherapeutic INR ?Patient presented with INR of 7.6. improved prior to discharge. ?  ?Hypernatremia ?Resolved with D5 IV fluids.  ?  ?Hypokalemia ?Resolved with repletion. ?  ?Dehydration ?Patient with decreased oral intake at home. Resolved with IV fluids. ?  ?Rhabdomyolysis ?CK of 1,211 on admission with peak of 1,579. Trended down prior to discharge. ?  ?AKI ?Creatinine of 1.90 on admission. Resolved with IV fluids. ?  ?Depression ?On Effexor, Zyprexa at home.  Zyprexa discontinued on discharge. ? ?Aspiration pneumonia ?In setting of metabolic  encephalopathy. Treated with 5 days of Unasyn  ?  ?Severe protein calorie malnutrition ?Noted ? ? ?  ? ? ?Consultants: Neurology ?Procedures  performed: Long term EEG  ?Disposition: Home ?Diet recommendation:  ?Dysphagia type 1 thin Liquid ?DISCHARGE MEDICATION: ?Allergies as of 06/17/2021   ? ?   Reactions  ? Vicodin [hydrocodone-acetaminophen] Nausea And Vomiting  ? Morphine Rash  ? Morphine And Related Rash  ? Allergic reaction only in iv form  ? ?  ? ?  ?Medication List  ?  ? ?STOP taking these medications   ? ?levETIRAcetam 500 MG tablet ?Commonly known as: KEPPRA ?  ?OLANZapine zydis 5 MG disintegrating tablet ?Commonly known as: ZyPREXA Zydis ?  ? ?  ? ?TAKE these medications   ? ?acetaminophen 650 MG CR tablet ?Commonly known as: TYLENOL ?Take 650 mg by mouth every 8 (eight) hours as needed for pain. ?  ?brivaracetam 100 MG Tabs tablet ?Commonly known as: Briviact ?Take 1 tablet (100 mg total) by mouth 2 (two) times daily. ?  ?clonazePAM 1 MG tablet ?Commonly known as: KLONOPIN ?One tablet twice daily as needed for seizure. ?What changed:  ?how much to take ?how to take this ?when to take this ?reasons to take this ?  ?ketoconazole 2 % shampoo ?Commonly known as: NIZORAL ?Apply 1 application topically 2 (two) times a week. Monday,wednesday ?  ?lacosamide 200 MG Tabs tablet ?Commonly known as: VIMPAT ?Take 1 tablet (200 mg total) by mouth 2 (two) times daily. ?  ?lamoTRIgine 25 MG tablet ?Commonly known as: LaMICtal ?Take 2 tablets (50 mg total) by mouth 2 (two) times daily along with 300 mg 2 (two) times daily. Total of 350 mg twice daily (300 mg+ 50 mg) ?What changed:  ?how much to take ?how to take this ?when to take this ?  ?lamoTRIgine 100 MG tablet ?Commonly known as: LAMICTAL ?Take 3 tablets (300 mg total) by mouth 2 (two) times daily along with 50 mg 2 (two) times daily Total of 350 mg twice daily (300 mg+ 50 mg) ?What changed:  ?how much to take ?how to take this ?when to take this ?  ?levothyroxine 50 MCG tablet ?Commonly known as: SYNTHROID ?Take 50 mcg by mouth every evening. ?  ?omeprazole 20 MG capsule ?Commonly known as:  PRILOSEC ?Take 20 mg by mouth at bedtime. ?  ?prenatal multivitamin Tabs tablet ?Take 1 tablet by mouth daily. ?  ?promethazine 25 MG tablet ?Commonly known as: PHENERGAN ?Take 25 mg by mouth every 8 (eight) hours as needed for nausea or vomiting. ?  ?venlafaxine XR 150 MG 24 hr capsule ?Commonly known as: EFFEXOR-XR ?Take 1 capsule (150 mg total) by mouth daily with breakfast. ?  ?vitamin C 1000 MG tablet ?Take 1,000 mg by mouth daily. ?  ?warfarin 5 MG tablet ?Commonly known as: COUMADIN ?Take 1.5-2 tablets (7.5-10 mg total) by mouth See admin instructions. Pt takes one and a half tablets (7.'5mg'$  total) on Sunday and Wednesday and 2 tablets ('10mg'$  total) all other days. ?What changed: additional instructions ?  ? ?  ? ?  ?  ? ? ?  ?Durable Medical Equipment  ?(From admission, onward)  ?  ? ? ?  ? ?  Start     Ordered  ? 06/12/21 1259  For home use only DME Other see comment  Once       ?Comments: Hoyer lift  ?Question:  Length of Need  Answer:  6 Months  ? 06/12/21 1258  ? ?  ?  ? ?  ? ?  Follow-up Information   ? ? [facility] Follow up.   ?Why: The palliative care will contact you for the first home visit. ?Contact information: ?[address] ?[place] [address] ? ?  ?  ? ? [facility] [place] Follow up.   ?Specialty: Home Health Services ?Why: also known as [facility] ?They will be calling you  to set up your first home health appointment ?Contact information: ?[address] ?[address] ?[place] [address] ?[phone] ? ? ?  ?  ? ? Berkley Harvey, NP. Schedule an appointment as soon as possible for a visit in 1 week(s).   ?Specialty: Nurse Practitioner ?Why: For hospital follow-up ?Contact information: ?[address] ?[place] [address] ?[phone] ? ? ?  ?  ? ? Alric Ran, MD. Schedule an appointment as soon as possible for a visit in 2 month(s).   ?Specialty: Neurology ?Why: For hospital follow-up ?Contact information: ?[address] ?[address] ?[place] [address] ?[phone] ? ? ?  ?  ? ?  ?  ? ?  ? ?Discharge Exam: ?BP 125/85 (BP Location: Left Arm)   Pulse 96   Temp 98.1 ?F (36.7 ?C) (Oral)   Resp 16   Ht '5\' 6"'$  (1.676 m)   Wt 76.6 kg   SpO2 100%   BMI 27.26 kg

## 2021-06-17 NOTE — Discharge Instructions (Addendum)
Cassandra Andrade, ? ?You were in the hospital with confusion. This may have been related to an infection in addition to medication effect. Your seizure medications have been adjusted. Please follow-up with your PCP and neurologist as recommended. Recommendation is for you to have a pureed diet to minimize the chance of you chocking/aspirating. ?

## 2021-06-17 NOTE — TOC Transition Note (Signed)
Transition of Care (TOC) - CM/SW Discharge Note ? ? ?Patient Details  ?Name: Cassandra Andrade ?MRN: [id number] ?Date of Birth: [birth year] ? ?Transition of Care (TOC) CM/SW Contact:  ?Pollie Friar, RN ?Phone Number: ?06/17/2021, 11:46 AM ? ? ?Clinical Narrative:    ?Patient is discharging home with home health services through [facility] health and palliative care through [facility].  ?DME has been delivered to the home.  ?CM attempted to obtain caregivers through [facility] but they state pt is active with CAP services. CM called the patients mother and updated her to reach out to patients DSS worker to see why they are not getting aides through [facility].  ?Patient will transport home via [facility]. Bedside RN updated.   ? ? ?Final next level of care: [place] ?Barriers to Discharge: No Barriers Identified ? ? ?Patient Goals and CMS Choice ?  ?CMS Medicare.gov Compare Post Acute Care list provided to:: Patient Represenative (must comment) ?Choice offered to / list presented to : Parent (Mom) ? ?Discharge Placement ?  ?           ?  ?  ?  ?  ? ?Discharge Plan and Services ?  ?Discharge Planning Services: CM Consult ?Post Acute Care Choice: Home Health, Durable Medical Equipment          ?DME Arranged: Other see comment [facility] lift) ?  ?  ?  ?  ?HH Arranged: PT, OT, Social Work ?[place] Agency: [place] ([place]) ?  ?  ?  ? ?Social Determinants of Health (SDOH) Interventions ?  ? ? ?Readmission Risk Interventions ?   ? View : No data to display.  ?  ?  ?  ? ? ? ? ? ?

## 2021-06-24 ENCOUNTER — Emergency Department (HOSPITAL_COMMUNITY)
Admission: EM | Admit: 2021-06-24 | Discharge: 2021-06-24 | Disposition: A | Discharge status: Home / Self Care | Payer: Medicare Other | Attending: Emergency Medicine | Admitting: Emergency Medicine

## 2021-06-24 ENCOUNTER — Encounter (HOSPITAL_COMMUNITY): Payer: Self-pay

## 2021-06-24 ENCOUNTER — Other Ambulatory Visit: Payer: Self-pay

## 2021-06-24 DIAGNOSIS — Z85830 Personal history of malignant neoplasm of bone: Secondary | ICD-10-CM | POA: Diagnosis not present

## 2021-06-24 DIAGNOSIS — R791 Abnormal coagulation profile: Secondary | ICD-10-CM | POA: Diagnosis not present

## 2021-06-24 DIAGNOSIS — Z96651 Presence of right artificial knee joint: Secondary | ICD-10-CM | POA: Insufficient documentation

## 2021-06-24 DIAGNOSIS — R799 Abnormal finding of blood chemistry, unspecified: Secondary | ICD-10-CM | POA: Diagnosis present

## 2021-06-24 LAB — CBC
HCT: 29.7 % — ABNORMAL LOW (ref 36.0–46.0)
Hemoglobin: 8.9 g/dL — ABNORMAL LOW (ref 12.0–15.0)
MCH: 27.9 pg (ref 26.0–34.0)
MCHC: 30 g/dL (ref 30.0–36.0)
MCV: 93.1 fL (ref 80.0–100.0)
Platelets: 298 10*3/uL (ref 150–400)
RBC: 3.19 MIL/uL — ABNORMAL LOW (ref 3.87–5.11)
RDW: 13.5 % (ref 11.5–15.5)
WBC: 6.4 10*3/uL (ref 4.0–10.5)
nRBC: 0 % (ref 0.0–0.2)

## 2021-06-24 LAB — PROTIME-INR
INR: 5.7 (ref 0.8–1.2)
Prothrombin Time: 51 seconds — ABNORMAL HIGH (ref 11.4–15.2)

## 2021-06-24 NOTE — Discharge Instructions (Signed)
You were evaluated in the Emergency Department and after careful evaluation, we did not find any emergent condition requiring admission or further testing in the hospital. ? ?Your exam/testing today is overall reassuring.  INR here in the emergency department was 5.7.  Recommend skipping the warfarin today (5-3) and restarting the next day.  Follow-up closely with your regular doctor and keep a close eye on your INR levels at home. ? ?Please return to the Emergency Department if you experience any worsening of your condition.   Thank you for allowing [place] to be a part of your care. ?

## 2021-06-24 NOTE — ED Notes (Signed)
Patient to er room 29 via EMS. EMS reports patient mother attempted to check INR level at 0000 today and was unsuccessful d/t being unsure of how to use equipment. Mother requested patient  be brought to ER for INR levels. Patient has significant medical history and is unable to provide any information - EMS reports patient at baseline per mother. Patient opens eyes to verbal stimuli but otherwise follows no verbal or physical commands and provides no interaction. Patient is a RAK amputee.  ?

## 2021-06-24 NOTE — ED Notes (Signed)
Spoke to patients mother and reviewed AVS and confirmed home address for PTAR. ?

## 2021-06-24 NOTE — ED Provider Notes (Signed)
?[place] DEPT ?[facility] Emergency Department ?Provider Note ?MRN:  [id number]  ?Arrival date & time: 06/24/21    ? ?Chief Complaint   ?Abnormal lab ?History of Present Illness   ?Cassandra Andrade is a 56 y.o. year-old female with a history of CVA, seizure disorder, antiphospholipid syndrome presenting to the ED with chief complaint of abnormal lab. ? ?Patient recently admitted to the hospital for encephalopathy and at that time had an elevated INR.  She is cared for by her mother, who was given a device for checking INR at home.  Mother was unable to figure out how to use this device and became worried, was reportedly tearful with EMS when the EMS personnel could not figure out how to use it either.  Patient's mother requested that patient be sent here for evaluation.  No bleeding, no other symptoms or complaints.  Patient is globally altered, nonverbal, mildly somnolent, but reportedly at baseline per mother. ? ?Review of Systems  ?A thorough review of systems was obtained and all systems are negative except as noted in the HPI and PMH.  ? ?Patient's Health History   ? ?Past Medical History:  ?Diagnosis Date  ? Anxiety   ? Arthritis   ? Cancer [facility])   ? Osteosarcoma, right leg  ? CVA (cerebrovascular accident due to intracerebral hemorrhage) ([place]) 2004  ? Depression   ? DVT (deep venous thrombosis) ([place])   ? Left leg  ? MRSA (methicillin resistant Staphylococcus aureus)   ? S/P BKA (below knee amputation) unilateral ([place])   ? Right   ? Seizures ([place])   ? Thyroid disease   ? 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  ?Past Surgical History:  ?Procedure Laterality Date  ? ABOVE KNEE LEG AMPUTATION Right   ? Osteosarcoma  ? BRAIN SURGERY  2004  ? cerebral aneurysm  ? FRACTURE SURGERY    ? Rt. femur, folllowed by MRSA  ? TOTAL KNEE ARTHROPLASTY Right   ?  ?Family History  ?Problem Relation Age of Onset  ? Anxiety disorder Mother   ? Diabetes Father   ? Seizures Neg Hx   ?  ?Social History   ? ?Socioeconomic History  ? Marital status: Divorced  ?  Spouse name: Not on file  ? Number of children: 3  ? Years of education: GED  ? Highest education level: Not on file  ?Occupational History  ? Not on file  ?Tobacco Use  ? Smoking status: Never  ? Smokeless tobacco: Never  ? Tobacco comments:  ?  never used tobacco  ?Vaping Use  ? Vaping Use: Never used  ?Substance and Sexual Activity  ? Alcohol use: No  ?  Alcohol/week: 0.0 standard drinks  ? Drug use: No  ? Sexual activity: Not Currently  ?Other Topics Concern  ? Not on file  ?Social History Narrative  ? Patient lives at home with mom.   ? Patient has 3 children.   ? Drinks no caffeine  ? Right Handed  ?   ?   ? ?Social Determinants of Health  ? ?Financial Resource Strain: Not on file  ?Food Insecurity: Not on file  ?Transportation Needs: Not on file  ?Physical Activity: Not on file  ?Stress: Not on file  ?Social Connections: Not on file  ?Intimate Partner Violence: Not on file  ?  ? ?Physical Exam  ? ?Vitals:  ? 06/24/21 0136  ?BP: 115/70  ?Pulse: (!) 104  ?Resp: 17  ?Temp: 98.1 ?F (36.7 ?C)  ?SpO2:  95%  ?  ?CONSTITUTIONAL: Chronically ill-appearing, NAD ?NEURO/PSYCH: Somnolent, wakes to touch, nonverbal, largely immobile ?EYES:  eyes equal and reactive ?ENT/NECK:  no LAD, no JVD ?CARDIO: Regular rate, well-perfused, normal S1 and S2 ?PULM:  CTAB no wheezing or rhonchi ?GI/GU:  non-distended, non-tender ?MSK/SPINE:  No gross deformities, no edema ?SKIN:  no rash, atraumatic ? ? ?*Additional and/or pertinent findings included in MDM below ? ?Diagnostic and Interventional Summary  ? ? EKG Interpretation ? ?Date/Time:  Wednesday Jun 24 2021 01:33:04 EDT ?Ventricular Rate:  106 ?PR Interval:  230 ?QRS Duration: 77 ?QT Interval:  315 ?QTC Calculation: 419 ?R Axis:   87 ?Text Interpretation: Sinus tachycardia Prolonged PR interval Right atrial enlargement Nonspecific T abnormalities, lateral leads Minimal ST elevation, inferior leads No significant change was  found Confirmed by Gerlene Fee [phone]) on 06/24/2021 1:37:56 AM ?  ? ?  ? ?Labs Reviewed  ?PROTIME-INR - Abnormal; Notable for the following components:  ?    Result Value  ? Prothrombin Time 51.0 (*)   ? INR 5.7 (*)   ? All other components within normal limits  ?CBC - Abnormal; Notable for the following components:  ? RBC 3.19 (*)   ? Hemoglobin 8.9 (*)   ? HCT 29.7 (*)   ? All other components within normal limits  ?COMPREHENSIVE METABOLIC PANEL  ?  ?No orders to display  ?  ?Medications - No data to display  ? ?Procedures  /  Critical Care ?Procedures ? ?ED Course and Medical Decision Making  ?Initial Impression and Ddx ?Patient here for lab check, no significant complaints per report.  We will reach out to patient's mother to see if there is any more to the story.  Until then checking INR. ? ?Past medical/surgical history that increases complexity of ED encounter: Stroke, seizure disorder, antiphospholipid syndrome ? ?Interpretation of Diagnostics ?I personally reviewed the laboratory assessment and my interpretation is as follows: INR 5.7, blood counts largely at patient's recent baseline ? ?Clinical Course as of 06/24/21 0302  ?Wed Jun 24, 2021  ?0159 Reached out to patient's mother by phone, who explains that the INR level was 8.0 at home.  Denies any bleeding or any change to her clinical status recently.  Will check INR and basic labs here in the emergency department. [MB]  ?  ?Clinical Course User Index ?[MB] Maudie Flakes, MD  ?  ? ? ?Patient Reassessment and Ultimate Disposition/Management ?Patient continues to be at her baseline clinical status with reassuring vital signs, given the elevated INR but no bleeding, patient's Coumadin will be held today.  This plan discussed with patient's mother who is agreeable. ? ?Patient management required discussion with the following services or consulting groups:  None ? ?Complexity of Problems Addressed ?Acute illness or injury that poses threat of life of  bodily function ? ?Additional Data Reviewed and Analyzed ?Further history obtained from: ?EMS on arrival ? ?Additional Factors Impacting ED Encounter Risk ?None ? ?Barth Kirks. Sedonia Small, MD ?[facility] Emergency Medicine ?[email or web address] ? ?Final Clinical Impressions(s) / ED Diagnoses  ? ?  ICD-10-CM   ?1. Supratherapeutic INR  R79.1   ?  ?  ?ED Discharge Orders   ? ? None  ? ?  ?  ? ?Discharge Instructions Discussed with and Provided to Patient:  ? ? ? ?Discharge Instructions   ? ?  ?You were evaluated in the Emergency Department and after careful evaluation, we did not find any emergent condition requiring admission  or further testing in the hospital. ? ?Your exam/testing today is overall reassuring.  INR here in the emergency department was 5.7.  Recommend skipping the warfarin today (5-3) and restarting the next day.  Follow-up closely with your regular doctor and keep a close eye on your INR levels at home. ? ?Please return to the Emergency Department if you experience any worsening of your condition.   Thank you for allowing [place] to be a part of your care. ? ? ? ? ?  ?Maudie Flakes, MD ?06/24/21 0304 ? ?

## 2021-06-24 NOTE — ED Triage Notes (Signed)
EMS reports patient mother attempted to check INR level at 0000 today and was unsuccessful d/t being unsure of how to use equipment. Mother requested patient  be brought to ER for INR levels. Patient has significant medical history and is unable to provide any information - EMS reports patient at baseline per mother.  ?

## 2021-06-24 NOTE — ED Notes (Signed)
Report given and care endorsed to nurse assuming care of patient.  ?

## 2021-06-27 ENCOUNTER — Other Ambulatory Visit: Payer: Self-pay | Admitting: Neurology

## 2021-07-07 ENCOUNTER — Encounter (HOSPITAL_COMMUNITY): Payer: Self-pay | Admitting: Psychiatry

## 2021-07-07 ENCOUNTER — Telehealth (HOSPITAL_BASED_OUTPATIENT_CLINIC_OR_DEPARTMENT_OTHER): Payer: Medicare Other | Admitting: Psychiatry

## 2021-07-07 DIAGNOSIS — F419 Anxiety disorder, unspecified: Secondary | ICD-10-CM | POA: Diagnosis not present

## 2021-07-07 DIAGNOSIS — F321 Major depressive disorder, single episode, moderate: Secondary | ICD-10-CM | POA: Diagnosis not present

## 2021-07-07 DIAGNOSIS — F39 Unspecified mood [affective] disorder: Secondary | ICD-10-CM | POA: Diagnosis not present

## 2021-07-07 MED ORDER — VENLAFAXINE HCL ER 150 MG PO CP24: 150.0000 mg | ORAL_CAPSULE | Freq: Every day | ORAL | 1 refills | Status: DC

## 2021-07-07 NOTE — Progress Notes (Signed)
Virtual Visit via Telephone Note ? ?I connected with Cassandra Andrade on 07/07/21 at  3:20 PM EDT by telephone and verified that I am speaking with the correct person using two identifiers. ? ?Location: ?Patient: Home ?Provider: Home Office ?  ?I discussed the limitations, risks, security and privacy concerns of performing an evaluation and management service by telephone and the availability of in person appointments. I also discussed with the patient that there may be a patient responsible charge related to this service. The patient expressed understanding and agreed to proceed. ? ? ?History of Present Illness: ?Patient is evaluated by phone session.  Most of the information was obtained from her mother who was present next to the patient.  I tried to connect and talk with the patient but she was rambling, incoherent and confused.  She was hospitalized from April 18 to April 27 due to change in mental status, acute encephalopathy, sepsis and UTI.  Her medical condition continue to deteriorate.  However her mother reported that she is no longer combative and agitated.  She rambles a lot and remained confused and at times incoherent.  She has difficulty remembering things.  She is no longer on Keppra and her olanzapine was discontinued.  Patient appetite is low and she required 24/7 observation.  She gets home health aide and her mother also helps her a lot.  As per mother Drs. Teal are focused on comfort care and may need long-term placement.  As per mother her aggression and agitation is not as bad but sometimes she noticed crying and calling her name.  When tried to ask patient about suicidal thoughts or hallucination she did not answer and remained very confused. ?  ?Observations/Objective: ?A complete mental status admission cannot be done on the phone.  Most of the information was obtained from her mother.  I can hear in the background her voice as she calling her mother.  She appears confused, incoherent.  Her  thought process is loose and circumstantial.  Her memory is impaired. ? ?Assessment and Plan: ?Major depressive disorder, recurrent.  Anxiety.  Mood disorder due to general medical condition. ? ?I reviewed notes and emergency room visits.  She is no longer taking olanzapine and mother reported she is not combative.  I recommend not to give her olanzapine as patient reported focus is to give comfort care.  She is seeing a neurologist.  We will continue olanzapine 150 mg daily.  I recommend that we can follow-up in 6 months unless a sooner appointment needed.  Patient mother agreed with the plan. ? ?Follow Up Instructions: ? ?  ?I discussed the assessment and treatment plan with the patient. The patient was provided an opportunity to ask questions and all were answered. The patient agreed with the plan and demonstrated an understanding of the instructions. ?  ?The patient was advised to call back or seek an in-person evaluation if the symptoms worsen or if the condition fails to improve as anticipated. ? ?Collaboration of Care: Other provider involved in patient's care AEB notes are available in epic to review. ? ?Patient/Guardian was advised Release of Information must be obtained prior to any record release in order to collaborate their care with an outside provider. Patient/Guardian was advised if they have not already done so to contact the registration department to sign all necessary forms in order for [place] to release information regarding their care.  ? ?Consent: Patient/Guardian gives verbal consent for treatment and assignment of benefits for services provided during  this visit. Patient/Guardian expressed understanding and agreed to proceed.   ? ?I provided 18 minutes of non-face-to-face time during this encounter. ? ? ?Kathlee Nations, MD  ?

## 2021-07-09 ENCOUNTER — Other Ambulatory Visit: Payer: Self-pay | Admitting: Adult Health

## 2021-07-09 ENCOUNTER — Other Ambulatory Visit (HOSPITAL_COMMUNITY): Payer: Self-pay | Admitting: Psychiatry

## 2021-07-09 DIAGNOSIS — F419 Anxiety disorder, unspecified: Secondary | ICD-10-CM

## 2021-07-09 DIAGNOSIS — F321 Major depressive disorder, single episode, moderate: Secondary | ICD-10-CM

## 2021-07-14 ENCOUNTER — Emergency Department (HOSPITAL_COMMUNITY): Payer: Medicare Other

## 2021-07-14 ENCOUNTER — Encounter (HOSPITAL_COMMUNITY): Payer: Self-pay

## 2021-07-14 ENCOUNTER — Other Ambulatory Visit: Payer: Self-pay

## 2021-07-14 ENCOUNTER — Inpatient Hospital Stay (HOSPITAL_COMMUNITY)
Admission: EM | Admit: 2021-07-14 | Discharge: 2021-07-20 | DRG: 689 | Disposition: A | Discharge status: Home / Self Care | Payer: Medicare Other | Attending: Family Medicine | Admitting: Family Medicine

## 2021-07-14 DIAGNOSIS — Z833 Family history of diabetes mellitus: Secondary | ICD-10-CM

## 2021-07-14 DIAGNOSIS — R1310 Dysphagia, unspecified: Secondary | ICD-10-CM | POA: Diagnosis present

## 2021-07-14 DIAGNOSIS — R532 Functional quadriplegia: Secondary | ICD-10-CM | POA: Diagnosis present

## 2021-07-14 DIAGNOSIS — K5909 Other constipation: Secondary | ICD-10-CM | POA: Diagnosis present

## 2021-07-14 DIAGNOSIS — Z87820 Personal history of traumatic brain injury: Secondary | ICD-10-CM

## 2021-07-14 DIAGNOSIS — N3001 Acute cystitis with hematuria: Principal | ICD-10-CM | POA: Diagnosis present

## 2021-07-14 DIAGNOSIS — R4182 Altered mental status, unspecified: Secondary | ICD-10-CM | POA: Diagnosis not present

## 2021-07-14 DIAGNOSIS — Z79899 Other long term (current) drug therapy: Secondary | ICD-10-CM

## 2021-07-14 DIAGNOSIS — D6862 Lupus anticoagulant syndrome: Secondary | ICD-10-CM | POA: Diagnosis present

## 2021-07-14 DIAGNOSIS — Z89611 Acquired absence of right leg above knee: Secondary | ICD-10-CM

## 2021-07-14 DIAGNOSIS — G9341 Metabolic encephalopathy: Secondary | ICD-10-CM | POA: Diagnosis present

## 2021-07-14 DIAGNOSIS — F339 Major depressive disorder, recurrent, unspecified: Secondary | ICD-10-CM | POA: Diagnosis present

## 2021-07-14 DIAGNOSIS — Z79890 Hormone replacement therapy (postmenopausal): Secondary | ICD-10-CM

## 2021-07-14 DIAGNOSIS — F32A Depression, unspecified: Secondary | ICD-10-CM | POA: Diagnosis present

## 2021-07-14 DIAGNOSIS — N390 Urinary tract infection, site not specified: Secondary | ICD-10-CM | POA: Diagnosis present

## 2021-07-14 DIAGNOSIS — Z86718 Personal history of other venous thrombosis and embolism: Secondary | ICD-10-CM

## 2021-07-14 DIAGNOSIS — Z85830 Personal history of malignant neoplasm of bone: Secondary | ICD-10-CM

## 2021-07-14 DIAGNOSIS — E860 Dehydration: Secondary | ICD-10-CM | POA: Diagnosis present

## 2021-07-14 DIAGNOSIS — G894 Chronic pain syndrome: Secondary | ICD-10-CM | POA: Diagnosis present

## 2021-07-14 DIAGNOSIS — G40909 Epilepsy, unspecified, not intractable, without status epilepticus: Secondary | ICD-10-CM

## 2021-07-14 DIAGNOSIS — G9340 Encephalopathy, unspecified: Secondary | ICD-10-CM | POA: Insufficient documentation

## 2021-07-14 DIAGNOSIS — E876 Hypokalemia: Secondary | ICD-10-CM

## 2021-07-14 DIAGNOSIS — E440 Moderate protein-calorie malnutrition: Secondary | ICD-10-CM | POA: Diagnosis present

## 2021-07-14 DIAGNOSIS — Z885 Allergy status to narcotic agent status: Secondary | ICD-10-CM

## 2021-07-14 DIAGNOSIS — Z89519 Acquired absence of unspecified leg below knee: Secondary | ICD-10-CM

## 2021-07-14 DIAGNOSIS — Z7901 Long term (current) use of anticoagulants: Secondary | ICD-10-CM

## 2021-07-14 DIAGNOSIS — Z7401 Bed confinement status: Secondary | ICD-10-CM

## 2021-07-14 DIAGNOSIS — F419 Anxiety disorder, unspecified: Secondary | ICD-10-CM | POA: Diagnosis present

## 2021-07-14 DIAGNOSIS — L8990 Pressure ulcer of unspecified site, unspecified stage: Secondary | ICD-10-CM | POA: Insufficient documentation

## 2021-07-14 DIAGNOSIS — B965 Pseudomonas (aeruginosa) (mallei) (pseudomallei) as the cause of diseases classified elsewhere: Secondary | ICD-10-CM | POA: Diagnosis present

## 2021-07-14 DIAGNOSIS — E079 Disorder of thyroid, unspecified: Secondary | ICD-10-CM | POA: Diagnosis present

## 2021-07-14 DIAGNOSIS — Z818 Family history of other mental and behavioral disorders: Secondary | ICD-10-CM

## 2021-07-14 LAB — COMPREHENSIVE METABOLIC PANEL
ALT: 16 U/L (ref 0–44)
AST: 22 U/L (ref 15–41)
Albumin: 2.8 g/dL — ABNORMAL LOW (ref 3.5–5.0)
Alkaline Phosphatase: 92 U/L (ref 38–126)
Anion gap: 11 (ref 5–15)
BUN: 12 mg/dL (ref 6–20)
CO2: 26 mmol/L (ref 22–32)
Calcium: 9.6 mg/dL (ref 8.9–10.3)
Chloride: 102 mmol/L (ref 98–111)
Creatinine, Ser: 1 mg/dL (ref 0.44–1.00)
GFR, Estimated: >60 mL/min (ref >60)
Glucose, Bld: 89 mg/dL (ref 70–99)
Potassium: 4.3 mmol/L (ref 3.5–5.1)
Sodium: 139 mmol/L (ref 135–145)
Total Bilirubin: 0.4 mg/dL (ref 0.3–1.2)
Total Protein: 6.8 g/dL (ref 6.5–8.1)

## 2021-07-14 LAB — CBC WITH DIFFERENTIAL/PLATELET
Abs Immature Granulocytes: 0.01 10*3/uL (ref 0.00–0.07)
Basophils Absolute: 0 10*3/uL (ref 0.0–0.1)
Basophils Relative: 1 %
Eosinophils Absolute: 0.1 10*3/uL (ref 0.0–0.5)
Eosinophils Relative: 1 %
HCT: 36.1 % (ref 36.0–46.0)
Hemoglobin: 10.6 g/dL — ABNORMAL LOW (ref 12.0–15.0)
Immature Granulocytes: 0 %
Lymphocytes Relative: 41 %
Lymphs Abs: 2.1 10*3/uL (ref 0.7–4.0)
MCH: 27.2 pg (ref 26.0–34.0)
MCHC: 29.4 g/dL — ABNORMAL LOW (ref 30.0–36.0)
MCV: 92.6 fL (ref 80.0–100.0)
Monocytes Absolute: 0.4 10*3/uL (ref 0.1–1.0)
Monocytes Relative: 8 %
Neutro Abs: 2.5 10*3/uL (ref 1.7–7.7)
Neutrophils Relative %: 49 %
Platelets: 243 10*3/uL (ref 150–400)
RBC: 3.9 MIL/uL (ref 3.87–5.11)
RDW: 14.4 % (ref 11.5–15.5)
WBC: 5 10*3/uL (ref 4.0–10.5)
nRBC: 0 % (ref 0.0–0.2)

## 2021-07-14 LAB — URINALYSIS, MICROSCOPIC (REFLEX)
RBC / HPF: >50 RBC/hpf (ref 0–5)
WBC, UA: >50 WBC/hpf (ref 0–5)

## 2021-07-14 LAB — URINALYSIS, ROUTINE W REFLEX MICROSCOPIC

## 2021-07-14 LAB — CK: Total CK: 140 U/L (ref 38–234)

## 2021-07-14 LAB — PROTIME-INR
INR: 9.5 (ref 0.8–1.2)
Prothrombin Time: 76.1 seconds — ABNORMAL HIGH (ref 11.4–15.2)

## 2021-07-14 MED ORDER — MEGESTROL ACETATE 400 MG/10ML PO SUSP
400.0000 mg | Freq: Every day | ORAL | Status: DC
  Administered 2021-07-16 – 2021-07-20 (×5): 400 mg via ORAL
  Filled 2021-07-14 (×7): qty 10

## 2021-07-14 MED ORDER — ONDANSETRON HCL 4 MG PO TABS: 4.0000 mg | ORAL_TABLET | Freq: Four times a day (QID) | ORAL | Status: DC | PRN

## 2021-07-14 MED ORDER — PHYTONADIONE 5 MG PO TABS
5.0000 mg | ORAL_TABLET | Freq: Once | ORAL | Status: DC
  Filled 2021-07-14: qty 1

## 2021-07-14 MED ORDER — LAMOTRIGINE 100 MG PO TABS
350.0000 mg | ORAL_TABLET | Freq: Two times a day (BID) | ORAL | Status: DC
  Administered 2021-07-15 – 2021-07-20 (×10): 350 mg via ORAL
  Filled 2021-07-14 (×7): qty 4
  Filled 2021-07-14: qty 3.5
  Filled 2021-07-14 (×2): qty 4
  Filled 2021-07-14: qty 3.5
  Filled 2021-07-14: qty 4

## 2021-07-14 MED ORDER — ACETAMINOPHEN 325 MG PO TABS
650.0000 mg | ORAL_TABLET | Freq: Three times a day (TID) | ORAL | Status: DC | PRN
  Administered 2021-07-16: 650 mg via ORAL
  Filled 2021-07-14: qty 2

## 2021-07-14 MED ORDER — SODIUM CHLORIDE 0.9% FLUSH
10.0000 mL | Freq: Two times a day (BID) | INTRAVENOUS | Status: DC
  Administered 2021-07-15 – 2021-07-20 (×10): 10 mL

## 2021-07-14 MED ORDER — LAMOTRIGINE 25 MG PO TABS: 50.0000 mg | ORAL_TABLET | ORAL | Status: DC

## 2021-07-14 MED ORDER — PRENATAL MULTIVITAMIN CH
1.0000 | ORAL_TABLET | Freq: Every day | ORAL | Status: DC
  Filled 2021-07-14: qty 1

## 2021-07-14 MED ORDER — SODIUM CHLORIDE 0.9 % IV SOLN
2.0000 g | Freq: Once | INTRAVENOUS | Status: AC
  Administered 2021-07-14: 2 g via INTRAVENOUS
  Filled 2021-07-14: qty 20

## 2021-07-14 MED ORDER — PANTOPRAZOLE SODIUM 40 MG PO TBEC: 40.0000 mg | DELAYED_RELEASE_TABLET | Freq: Every day | ORAL | Status: DC

## 2021-07-14 MED ORDER — LEVOTHYROXINE SODIUM 50 MCG PO TABS
50.0000 ug | ORAL_TABLET | Freq: Every evening | ORAL | Status: DC
  Filled 2021-07-14: qty 1

## 2021-07-14 MED ORDER — PROMETHAZINE HCL 25 MG PO TABS: 25.0000 mg | ORAL_TABLET | Freq: Three times a day (TID) | ORAL | Status: DC | PRN

## 2021-07-14 MED ORDER — VITAMIN K1 10 MG/ML IJ SOLN
5.0000 mg | Freq: Once | INTRAVENOUS | Status: AC
  Administered 2021-07-14: 5 mg via INTRAVENOUS
  Filled 2021-07-14: qty 0.5

## 2021-07-14 MED ORDER — SODIUM CHLORIDE 0.9 % IV SOLN
200.0000 mg | Freq: Two times a day (BID) | INTRAVENOUS | Status: DC
  Administered 2021-07-14 – 2021-07-20 (×13): 200 mg via INTRAVENOUS
  Filled 2021-07-14 (×16): qty 20

## 2021-07-14 MED ORDER — CLONAZEPAM 0.5 MG PO TABS: 1.0000 mg | ORAL_TABLET | Freq: Two times a day (BID) | ORAL | Status: DC | PRN

## 2021-07-14 MED ORDER — LACOSAMIDE 50 MG PO TABS: 200.0000 mg | ORAL_TABLET | Freq: Two times a day (BID) | ORAL | Status: DC

## 2021-07-14 MED ORDER — ONDANSETRON HCL 4 MG/2ML IJ SOLN: 4.0000 mg | Freq: Four times a day (QID) | INTRAMUSCULAR | Status: DC | PRN

## 2021-07-14 MED ORDER — VENLAFAXINE HCL ER 75 MG PO CP24
150.0000 mg | ORAL_CAPSULE | Freq: Every day | ORAL | Status: DC
  Administered 2021-07-16 – 2021-07-20 (×5): 150 mg via ORAL
  Filled 2021-07-14 (×5): qty 2

## 2021-07-14 MED ORDER — SODIUM CHLORIDE 0.9 % IV SOLN
1.0000 g | INTRAVENOUS | Status: DC
  Administered 2021-07-15: 1 g via INTRAVENOUS
  Filled 2021-07-14: qty 10

## 2021-07-14 MED ORDER — SODIUM CHLORIDE 0.9 % IV SOLN: INTRAVENOUS | Status: AC

## 2021-07-14 MED ORDER — SODIUM CHLORIDE 0.9 % IV BOLUS
1000.0000 mL | Freq: Once | INTRAVENOUS | Status: AC
  Administered 2021-07-14: 1000 mL via INTRAVENOUS

## 2021-07-14 MED ORDER — SODIUM CHLORIDE 0.9% FLUSH: 10.0000 mL | INTRAVENOUS | Status: DC | PRN

## 2021-07-14 NOTE — Progress Notes (Signed)
ANTICOAGULATION CONSULT NOTE - Follow Up Consult  Pharmacy Consult for warfarin Indication: History of DVT, lupus anticoagulant disorder  Allergies  Allergen Reactions   Vicodin [Hydrocodone-Acetaminophen] Nausea And Vomiting   Morphine Rash   Morphine And Related Rash    Allergic reaction only in iv form    Patient Measurements: Height: '5\' 6"'$  (167.6 cm) Weight: 76.6 kg (168 lb 14 oz) IBW/kg (Calculated) : 59.3  Vital Signs: Temp: 98.3 F (36.8 C) (05/23 1930) Temp Source: Oral (05/23 1930) BP: 116/76 (05/23 1930) Pulse Rate: 95 (05/23 1930)  Labs: Recent Labs    07/14/21 0954 07/14/21 2020  HGB 10.6*  --   HCT 36.1  --   PLT 243  --   LABPROT  --  76.1*  INR  --  9.5*  CREATININE 1.00  --     Estimated Creatinine Clearance: 67.2 mL/min (by C-G formula based on SCr of 1 mg/dL).   Medications:  Scheduled:   lamoTRIgine  350 mg Oral BID   [START ON 07/15/2021] levothyroxine  50 mcg Oral QPM   megestrol  400 mg Oral Daily   pantoprazole  40 mg Oral Daily   prenatal multivitamin  1 tablet Oral Daily   [START ON 07/15/2021] venlafaxine XR  150 mg Oral Q breakfast    Assessment: 56 y.o. 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 Pharmacy consulted for resumption of home warfarin.   PTA warfarin as of 06/16/21: '10mg'$  daily except 7.'5mg'$  Sun and Wed INR on admit 9.5 History of elevated INRs on recent admissions  Goal of Therapy:  INR 2-3 Monitor platelets by anticoagulation protocol: Yes   Plan:  Hold warfarin today Nurse reaching out to provider regarding potential  Daily PT/INR level and CBC ordered Monitor for signs/symptoms of bleed  Thank you for  allowing pharmacy to be a part of this patient's care.  Donnald Garre, PharmD Clinical Pharmacist  Please check AMION for all [place] numbers After 10:00 PM, call [place] [phone]

## 2021-07-14 NOTE — ED Notes (Signed)
Speech therapist at bedside this time.

## 2021-07-14 NOTE — ED Provider Notes (Signed)
[facility] EMERGENCY DEPARTMENT Provider Note   CSN: [id number] Arrival date & time: 07/14/21  8413     History {Add pertinent medical, surgical, social history, OB history to HPI:1} Chief Complaint  Patient presents with  . Suspected UTI    Cassandra Andrade is a 56 y.o. female.  HPI  56 year old female presents emergency department with suspected UTI and altered mental status.  Patient was unaccompanied upon initial exam.  Patient was nonverbal so history was nonexistent.  Mother was contacted and gave the following history as seen in ED course: "2nd day not eating which is abnormal for her.  Mother states that patient does not want to take any medication over the past 2 days.  Anything that she was giving her, the patient was spit up.  There is concern for UTI given change in color/consistency of urine.  Urine sample was sent off by her PCP, but have not resulted yet.  Mother also says that patient has not been talking for the past 2 months.  Her baseline ability to talk is decreased, but she usually communicates with words understandable.  Mother states that she has been communicating with grunts and sounds over the past 2 months.  Mother confirmed mental status changes from baseline of patient.  The mother requested a feeding tube be placed at the end of the call."  Mother and caregiver denied any changes management visits or patient complaining of pain/discomfort in a given area.  Pertinent negative and positives were difficult to obtain besides what stated above.  Mother denies at home indwelling catheter.  Past medical history significant for unspecified epilepsy with intractable epilepsy, CVA, recurrent DVT.  Home Medications Prior to Admission medications   Medication Sig Start Date End Date Taking? Authorizing Provider  acetaminophen (TYLENOL) 650 MG CR tablet Take 650 mg by mouth every 8 (eight) hours as needed for pain.    [provider]  Ascorbic Acid  (VITAMIN C) 1000 MG tablet Take 1,000 mg by mouth daily. 08/17/11   [provider]  clonazePAM (KLONOPIN) 1 MG tablet One tablet twice daily as needed for seizure. Patient taking differently: Take 1 mg by mouth 2 (two) times daily as needed (seizures). One tablet twice daily as needed for seizure. 04/24/21   Alric Ran, MD  ketoconazole (NIZORAL) 2 % shampoo Apply 1 application topically 2 (two) times a week. Monday,wednesday 02/05/21   [provider]  lacosamide (VIMPAT) 200 MG TABS tablet Take 1 tablet (200 mg total) by mouth 2 (two) times daily. 04/20/21   Alric Ran, MD  lamoTRIgine (LAMICTAL) 100 MG tablet Take 3 tablets (300 mg total) by mouth 2 (two) times daily along with 50 mg 2 (two) times daily Total of 350 mg twice daily (300 mg+ 50 mg) Patient taking differently: Take 300 mg by mouth See admin instructions. Take 3 tablets (300 mg total) by mouth 2 (two) times daily along with 50 mg 2 (two) times daily Total of 350 mg twice daily (300 mg+ 50 mg) 04/20/21   Alric Ran, MD  lamoTRIgine (LAMICTAL) 25 MG tablet Take 2 tablets (50 mg total) by mouth 2 (two) times daily along with 300 mg 2 (two) times daily. Total of 350 mg twice daily (300 mg+ 50 mg) Patient taking differently: Take 50 mg by mouth See admin instructions. Take 2 tablets (50 mg total) by mouth 2 (two) times daily along with 300 mg 2 (two) times daily. Total of 350 mg twice daily (300 mg+ 50  mg) 04/20/21   Alric Ran, MD  levothyroxine (SYNTHROID, LEVOTHROID) 50 MCG tablet Take 50 mcg by mouth every evening.    [provider]  omeprazole (PRILOSEC) 20 MG capsule Take 20 mg by mouth at bedtime. 06/12/19   [provider]  Prenatal Vit-Fe Fumarate-FA (PRENATAL MULTIVITAMIN) TABS tablet Take 1 tablet by mouth daily.    [provider]  promethazine (PHENERGAN) 25 MG tablet Take 25 mg by mouth every 8 (eight) hours as needed for nausea or vomiting. 04/27/19   [provider]   venlafaxine XR (EFFEXOR-XR) 150 MG 24 hr capsule Take 1 capsule (150 mg total) by mouth daily with breakfast. 07/07/21   Arfeen, Arlyce Harman, MD  warfarin (COUMADIN) 5 MG tablet Take 1.5-2 tablets (7.5-10 mg total) by mouth See admin instructions. Pt takes one and a half tablets (7.'5mg'$  total) on Sunday and Wednesday and 2 tablets ('10mg'$  total) all other days. Patient taking differently: Take 7.5-10 mg by mouth See admin instructions. 7.'5mg'$  on Sundays and '10mg'$  on all other days or as directed. 01/03/18   Thurnell Lose, MD      Allergies    Vicodin [hydrocodone-acetaminophen], Morphine, and Morphine and related    Review of Systems   Review of Systems  Unable to perform ROS: Mental status change (ROS obtained described in HPI.)   Physical Exam Updated Vital Signs BP 110/62 (BP Location: Right Arm)   Pulse 91   Temp 97.9 F (36.6 C) (Oral)   Resp 18   Ht '5\' 6"'$  (1.676 m)   Wt 76.6 kg   SpO2 99%   BMI 27.26 kg/m  Physical Exam Vitals and nursing note reviewed.    ED Results / Procedures / Treatments   Labs (all labs ordered are listed, but only abnormal results are displayed) Labs Reviewed  URINE CULTURE  CBC WITH DIFFERENTIAL/PLATELET  COMPREHENSIVE METABOLIC PANEL  URINALYSIS, ROUTINE W REFLEX MICROSCOPIC  CBG MONITORING, ED    EKG None  Radiology No results found.  Procedures Procedures  {Document cardiac monitor, telemetry assessment procedure when appropriate:1}  Medications Ordered in ED Medications  sodium chloride 0.9 % bolus 1,000 mL (has no administration in time range)    ED Course/ Medical Decision Making/ A&P Clinical Course as of 07/14/21 1333  Tue Jul 14, 2021  0936 Contacted mother: 2nd day not eating which is abnormal for her.  Mother states that patient does not want to take any medication over the past 2 days.  Anything that she was giving her, the patient was spit up.  There is concern for UTI given change in color/consistency of urine.  Urine  sample was sent off by her PCP, but have not resulted yet.  Mother also says that patient has not been talking for the past 2 months.  Her baseline ability to talk is decreased, but she usually communicates with words understandable.  Mother states that she has been communicating with grunts and sounds over the past 2 months.  Mother confirmed mental status changes from baseline of patient.  The mother requested a feeding tube be placed at the end of the call. [CR]  [place] was consulted and they agreed with admission, assuming further assessment and care of patient.  [CR]    Clinical Course User Index [CR] Wilnette Kales, PA                           Medical Decision Making Amount and/or  Complexity of Data Reviewed Labs: ordered. Radiology: ordered.   ***  {Document critical care time when appropriate:1} {Document review of labs and clinical decision tools ie heart score, Chads2Vasc2 etc:1}  {Document your independent review of radiology images, and any outside records:1} {Document your discussion with family members, caretakers, and with consultants:1} {Document social determinants of health affecting pt's care:1} {Document your decision making why or why not admission, treatments were needed:1} Final Clinical Impression(s) / ED Diagnoses Final diagnoses:  None    Rx / DC Orders ED Discharge Orders     None

## 2021-07-14 NOTE — ED Notes (Signed)
Attempted to feed pt with a small amount of applesauce. Pt refused to swallow applesauce. High risk for aspiration. NPO maintained at this time. Will notify MD.

## 2021-07-14 NOTE — Progress Notes (Signed)

## 2021-07-14 NOTE — H&P (Signed)
History and Physical    Cassandra Andrade XKG:[id number] DOB: Jun 27, 1965 DOA: 07/14/2021  PCP: Berkley Harvey, NP (Confirm with patient/family/NH records and if not entered, this has to be entered at [facility] point of entry) Patient coming from: Home  I have personally briefly reviewed patient's old medical records in [place]  Chief Complaint: Patient nonverbal  HPI: Cassandra Andrade is a 56 y.o. 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  Mother at bedside reported in bed last 2 days, patient has been eating and drinking very little, "body hot to touch" yesterday but no documented fever.  And patient's urine smell strong.  Meantime, mother noticed that the patient became confused and stopped talking altogether for the last 2 days.  No shaking-like body movement as per mother's observation and no fall.  Patient has history of dysphagia on pured diet but mother suspect patient not eating enough.  Mother denied patient ever complaining about abdominal pain.  And patient has a chronic constipation.  Overall, mother suspected patient has a new onset of UTI and brought her to ED.  ED Course: No hypotension no tachycardia afebrile.  UA compatible with UTI.  CT head negative for acute changes, chest x-ray negative for acute infiltrates.  WBC 5.0, creatinine 1.0, K4.3, hemoglobin 10.6.  Review of Systems: Unable to perform, patient multiple  Past Medical History:  Diagnosis Date   Anxiety    Arthritis    Cancer ([place])    Osteosarcoma, right leg   CVA (cerebrovascular accident due to intracerebral hemorrhage) ([place]) 2004   Depression    DVT (deep venous thrombosis) ([facility])    Left leg   MRSA  (methicillin resistant Staphylococcus aureus)    S/P BKA (below knee amputation) unilateral ([place])    Right    Seizures ([place])    Thyroid disease    Unspecified epilepsy with intractable epilepsy 02/28/2013    Past Surgical History:  Procedure Laterality Date   ABOVE KNEE LEG AMPUTATION Right    Osteosarcoma   BRAIN SURGERY  2004   cerebral aneurysm   FRACTURE SURGERY     Rt. femur, folllowed by MRSA   TOTAL KNEE ARTHROPLASTY Right      reports that she has never smoked. She has never used smokeless tobacco. She reports that she does not drink alcohol and does not use drugs.  Allergies  Allergen Reactions   Vicodin [Hydrocodone-Acetaminophen] Nausea And Vomiting   Morphine Rash   Morphine And Related Rash    Allergic reaction only in iv form    Family History  Problem Relation Age of Onset   Anxiety disorder Mother    Diabetes Father    Seizures Neg Hx      Prior to Admission medications   Medication Sig Start Date End Date Taking? Authorizing Provider  acetaminophen (TYLENOL) 650 MG CR tablet Take 650 mg by mouth every 8 (eight) hours as needed for pain.    [provider]  Ascorbic Acid (VITAMIN C) 1000 MG tablet Take 1,000 mg by mouth daily. 08/17/11   [provider]  clonazePAM (KLONOPIN) 1 MG tablet One tablet twice daily as needed for seizure. Patient taking differently: Take 1 mg by mouth 2 (two) times daily as needed (seizures). One tablet  twice daily as needed for seizure. 04/24/21   Alric Ran, MD  ketoconazole (NIZORAL) 2 % shampoo Apply 1 application topically 2 (two) times a week. Monday,wednesday 02/05/21   [provider]  lacosamide (VIMPAT) 200 MG TABS tablet Take 1 tablet (200 mg total) by mouth 2 (two) times daily. 04/20/21   Alric Ran, MD  lamoTRIgine (LAMICTAL) 100 MG tablet Take 3 tablets (300 mg total) by mouth 2 (two) times daily along with 50 mg 2 (two) times daily Total of 350 mg twice daily (300 mg+ 50 mg) Patient  taking differently: Take 300 mg by mouth See admin instructions. Take 3 tablets (300 mg total) by mouth 2 (two) times daily along with 50 mg 2 (two) times daily Total of 350 mg twice daily (300 mg+ 50 mg) 04/20/21   Alric Ran, MD  lamoTRIgine (LAMICTAL) 25 MG tablet Take 2 tablets (50 mg total) by mouth 2 (two) times daily along with 300 mg 2 (two) times daily. Total of 350 mg twice daily (300 mg+ 50 mg) Patient taking differently: Take 50 mg by mouth See admin instructions. Take 2 tablets (50 mg total) by mouth 2 (two) times daily along with 300 mg 2 (two) times daily. Total of 350 mg twice daily (300 mg+ 50 mg) 04/20/21   Alric Ran, MD  levothyroxine (SYNTHROID, LEVOTHROID) 50 MCG tablet Take 50 mcg by mouth every evening.    [provider]  omeprazole (PRILOSEC) 20 MG capsule Take 20 mg by mouth at bedtime. 06/12/19   [provider]  Prenatal Vit-Fe Fumarate-FA (PRENATAL MULTIVITAMIN) TABS tablet Take 1 tablet by mouth daily.    [provider]  promethazine (PHENERGAN) 25 MG tablet Take 25 mg by mouth every 8 (eight) hours as needed for nausea or vomiting. 04/27/19   [provider]  venlafaxine XR (EFFEXOR-XR) 150 MG 24 hr capsule Take 1 capsule (150 mg total) by mouth daily with breakfast. 07/07/21   Arfeen, Arlyce Harman, MD  warfarin (COUMADIN) 5 MG tablet Take 1.5-2 tablets (7.5-10 mg total) by mouth See admin instructions. Pt takes one and a half tablets (7.'5mg'$  total) on Sunday and Wednesday and 2 tablets ('10mg'$  total) all other days. Patient taking differently: Take 7.5-10 mg by mouth See admin instructions. 7.'5mg'$  on Sundays and '10mg'$  on all other days or as directed. 01/03/18   Thurnell Lose, MD    Physical Exam: Vitals:   07/14/21 1145 07/14/21 1200 07/14/21 1215 07/14/21 1315  BP: 108/63 105/61 106/62 104/67  Pulse: 86 93 88 91  Resp: (!) 23 19 (!) 22 18  Temp:      TempSrc:      SpO2: 100% 99% 100% 100%  Weight:      Height:         Constitutional: NAD, calm, comfortable Vitals:   07/14/21 1145 07/14/21 1200 07/14/21 1215 07/14/21 1315  BP: 108/63 105/61 106/62 104/67  Pulse: 86 93 88 91  Resp: (!) 23 19 (!) 22 18  Temp:      TempSrc:      SpO2: 100% 99% 100% 100%  Weight:      Height:       Eyes: PERRL, lids and conjunctivae normal ENMT: Mucous membranes are dry. Posterior pharynx clear of any exudate or lesions.Normal dentition.  Neck: normal, supple, no masses, no thyromegaly Respiratory: clear to auscultation bilaterally, no wheezing, no crackles. Normal respiratory effort. No accessory muscle use.  Cardiovascular: Regular rate and rhythm, no murmurs / rubs / gallops.  No extremity edema. 2+ pedal pulses. No carotid bruits.  Abdomen: no tenderness, no masses palpated. No hepatosplenomegaly. Bowel sounds positive.  Musculoskeletal: no clubbing / cyanosis. No joint deformity upper and lower extremities. Good ROM, no contractures. Normal muscle tone.  Skin: no rashes, lesions, ulcers. No induration Neurologic: No facial droops, chronic weakness on all 4 limbs Psychiatric: Awake, following simple commands moving head and opening mouth    Labs on Admission: I have personally reviewed following labs and imaging studies  CBC: Recent Labs  Lab 07/14/21 0954  WBC 5.0  NEUTROABS 2.5  HGB 10.6*  HCT 36.1  MCV 92.6  PLT 096   Basic Metabolic Panel: Recent Labs  Lab 07/14/21 0954  NA 139  K 4.3  CL 102  CO2 26  GLUCOSE 89  BUN 12  CREATININE 1.00  CALCIUM 9.6   GFR: Estimated Creatinine Clearance: 67.2 mL/min (by C-G formula based on SCr of 1 mg/dL). Liver Function Tests: Recent Labs  Lab 07/14/21 0954  AST 22  ALT 16  ALKPHOS 92  BILITOT 0.4  PROT 6.8  ALBUMIN 2.8*   No results for input(s): LIPASE, AMYLASE in the last 168 hours. No results for input(s): AMMONIA in the last 168 hours. Coagulation Profile: No results for input(s): INR, PROTIME in the last 168 hours. Cardiac  Enzymes: No results for input(s): CKTOTAL, CKMB, CKMBINDEX, TROPONINI in the last 168 hours. BNP (last 3 results) No results for input(s): PROBNP in the last 8760 hours. HbA1C: No results for input(s): HGBA1C in the last 72 hours. CBG: No results for input(s): GLUCAP in the last 168 hours. Lipid Profile: No results for input(s): CHOL, HDL, LDLCALC, TRIG, CHOLHDL, LDLDIRECT in the last 72 hours. Thyroid Function Tests: No results for input(s): TSH, T4TOTAL, FREET4, T3FREE, THYROIDAB in the last 72 hours. Anemia Panel: No results for input(s): VITAMINB12, FOLATE, FERRITIN, TIBC, IRON, RETICCTPCT in the last 72 hours. Urine analysis:    Component Value Date/Time   COLORURINE AMBER (A) 07/14/2021 1117   APPEARANCEUR TURBID (A) 07/14/2021 1117   LABSPEC  07/14/2021 1117    TEST NOT REPORTED DUE TO COLOR INTERFERENCE OF URINE PIGMENT   PHURINE  07/14/2021 1117    TEST NOT REPORTED DUE TO COLOR INTERFERENCE OF URINE PIGMENT   GLUCOSEU (A) 07/14/2021 1117    TEST NOT REPORTED DUE TO COLOR INTERFERENCE OF URINE PIGMENT   HGBUR (A) 07/14/2021 1117    TEST NOT REPORTED DUE TO COLOR INTERFERENCE OF URINE PIGMENT   BILIRUBINUR (A) 07/14/2021 1117    TEST NOT REPORTED DUE TO COLOR INTERFERENCE OF URINE PIGMENT   KETONESUR (A) 07/14/2021 1117    TEST NOT REPORTED DUE TO COLOR INTERFERENCE OF URINE PIGMENT   PROTEINUR (A) 07/14/2021 1117    TEST NOT REPORTED DUE TO COLOR INTERFERENCE OF URINE PIGMENT   UROBILINOGEN 0.2 05/05/2013 1110   NITRITE (A) 07/14/2021 1117    TEST NOT REPORTED DUE TO COLOR INTERFERENCE OF URINE PIGMENT   LEUKOCYTESUR (A) 07/14/2021 1117    TEST NOT REPORTED DUE TO COLOR INTERFERENCE OF URINE PIGMENT    Radiological Exams on Admission: DG Chest 1 View  Result Date: 07/14/2021 CLINICAL DATA:  Altered mental status, possible aspiration. EXAM: CHEST  1 VIEW COMPARISON:  June 09, 2021 radiograph FINDINGS: Stimulating generator in the left chest with lead projecting  over the left neck. The heart size and mediastinal contours are within normal limits. No focal airspace consolidation. No visible pleural effusion or pneumothorax. The visualized skeletal structures  are unremarkable. IMPRESSION: No acute cardiopulmonary findings. Electronically Signed   By: Dahlia Bailiff M.D.   On: 07/14/2021 12:32   CT Head Wo Contrast  Result Date: 07/14/2021 CLINICAL DATA:  Mental status change, unknown cause EXAM: CT HEAD WITHOUT CONTRAST TECHNIQUE: Contiguous axial images were obtained from the base of the skull through the vertex without intravenous contrast. RADIATION DOSE REDUCTION: This exam was performed according to the departmental dose-optimization program which includes automated exposure control, adjustment of the mA and/or kV according to patient size and/or use of iterative reconstruction technique. COMPARISON:  06/09/2021 head CT FINDINGS: Brain: Chronic severe right temporal greater than frontoparietal encephalomalacia with ex vacuo dilatation of the right lateral ventricle. No evidence of parenchymal hemorrhage or extra-axial fluid collection. No mass lesion, mass effect, or midline shift. No CT evidence of acute infarction. Nonspecific mild to moderate subcortical and periventricular white matter hypodensity, most in keeping with chronic small vessel ischemic change. Stable ventricles. Vascular: No acute abnormality. Surgical clip noted in the right middle cranial fossa. Skull: No evidence of calvarial fracture. Stable right pterional craniotomy change. Sinuses/Orbits: The visualized paranasal sinuses are essentially clear. Other:  The mastoid air cells are unopacified. IMPRESSION: 1. No evidence of acute intracranial abnormality. 2. Chronic severe right temporal greater than frontoparietal encephalomalacia with ex vacuo dilatation of the right lateral ventricle. Stable right pterional craniotomy change. 3. Mild-to-moderate chronic small vessel ischemic changes in the  cerebral white matter. Electronically Signed   By: Ilona Sorrel M.D.   On: 07/14/2021 10:32    EKG: Independently reviewed. Sinus, no acute ST-T changes  Assessment/Plan Principal Problem:   Encephalopathy Active Problems:   Acute metabolic encephalopathy   UTI (urinary tract infection)  (please populate well all problems here in Problem List. (For example, if patient is on BP meds at home and you resume or decide to hold them, it is a problem that needs to be her. Same for CAD, COPD, HLD and so on)  Acute metabolic encephalopathy -Likely secondary to UTI and dehydration -No symptoms or signs of breakthrough seizure this point. -CT head reassuring. -Continue ceftriaxone -Start maintenance IV fluid -Mother also asks about possible feeding tubes.  Explained to mother that the decreased oral intake the last 2 days might cost by UTI, thus probably a temporary changes, and patient body weight remains stable at 76.6 kg as compared to last month.  But will consult speech evaluation and nutrition.  UTI -No Hx of resistant organism infection, continue ceftriaxone while waiting for urine culture  Dehydration -Symptoms and signs of volume contraction, start maintenance IV fluid  Chronic dysphagia -According to mother may also has decreased appetite chronically -Start Megace -Speech evaluation  Seizure disorder -As above, no acute concern of breakthrough seizure.  Continue current antiseizure regimen of lacosamide, lamotrigine  History of DVT and lupus anticoagulant -Check INR and consult pharmacy for redosing Coumadin  History of ruptured brain aneurysm TBI and chronic functional quadriplegia -Baseline, no acute concerns, CT head reassuring.  DVT prophylaxis: Lovenox Code Status: Full code Family Communication: Mother at bedside Disposition Plan: Expect less than 2 midnight hospital stay Consults called: None Admission status: Medsurg obs   Lequita Halt MD [facility] Pager [phone]  07/14/2021, 1:55 PM

## 2021-07-14 NOTE — ED Notes (Signed)
Family at bedside. 

## 2021-07-14 NOTE — Evaluation (Signed)
Clinical/Bedside Swallow Evaluation Patient Details  Name: Dawnyel Leven MRN: [id number] Date of Birth: 09/29/1965  Today's Date: 07/14/2021 Time: SLP Start Time (ACUTE ONLY): 52 SLP Stop Time (ACUTE ONLY): 9562 SLP Time Calculation (min) (ACUTE ONLY): 20 min  Past Medical History:  Past Medical History:  Diagnosis Date   Anxiety    Arthritis    Cancer ([place])    Osteosarcoma, right leg   CVA (cerebrovascular accident due to intracerebral hemorrhage) ([place]) 2004   Depression    DVT (deep venous thrombosis) ([facility])    Left leg   MRSA (methicillin resistant Staphylococcus aureus)    S/P BKA (below knee amputation) unilateral ([place])    Right    Seizures ([place])    Thyroid disease    Unspecified epilepsy with intractable epilepsy 02/28/2013   Past Surgical History:  Past Surgical History:  Procedure Laterality Date   ABOVE KNEE LEG AMPUTATION Right    Osteosarcoma   BRAIN SURGERY  2004   cerebral aneurysm   FRACTURE SURGERY     Rt. femur, folllowed by MRSA   TOTAL KNEE ARTHROPLASTY Right    HPI:  Leida Luton is a 56 y.o. female who presented to ED with acute mentation changes x2 days. Dx encephalopathy, UTI, dehydration. PMHx significant for brain aneurysm rupture and intracranial hemorrhage status post ventricular shunt 2004, seizure disorder, DVT and lupus, status post BKA, chronic dysphagia on pured diet, functional quadriplegia and bedbound.  Recent admission April of this year - had SLP swallow eval 06/11/21 with recs for dysphagia 1 diet, thin liquids until mental status improved.    Assessment / Plan / Recommendation  Clinical Impression  Pt was seen in the ED for clinical swallow assessment prior to transport to room on 3W. She was alert, not talking this afternoon, did not follow commands. She accepted sips of water from a spoon and straw, demonstrating oral holding and subsequent swallow response with no s/s of aspiration. She accepted limited bites of pudding and ice cream  with consistent oral holding and eventual swallow.  SHe was last D/Cd from [facility] on a pureed diet - recommend resuming this diet (dysphagia 1/pureed) until MS improves. Continue thin liquids; crush meds. SLP will follow for safety/diet progression. D/W RN. SLP Visit Diagnosis: Dysphagia, oropharyngeal phase (R13.12)    Aspiration Risk    unknown   Diet Recommendation   Dysphagia 1/thin liquids  Medication Administration: Crushed with puree    Other  Recommendations Oral Care Recommendations: Oral care BID    Recommendations for follow up therapy are one component of a multi-disciplinary discharge planning process, led by the attending physician.  Recommendations may be updated based on patient status, additional functional criteria and insurance authorization.  Follow up Recommendations Other (comment) (tba)      Assistance Recommended at Discharge Frequent or constant Supervision/Assistance  Functional Status Assessment Patient has had a recent decline in their functional status and demonstrates the ability to make significant improvements in function in a reasonable and predictable amount of time.  Frequency and Duration min 2x/week  1 week       Prognosis Prognosis for Safe Diet Advancement: Good      Swallow Study   General Date of Onset: 07/14/21 HPI: Makynzi Eastland is a 57 y.o. female who presented to ED with acute mentation changes x2 days. Dx encephalopathy, UTI, dehydration. PMHx significant for brain aneurysm rupture and intracranial hemorrhage status post ventricular shunt 2004, seizure disorder, DVT and lupus, status post BKA, chronic dysphagia on pured  diet, functional quadriplegia and bedbound.  Recent admission April of this year - had SLP swallow eval 06/11/21 with recs for dysphagia 1 diet, thin liquids until mental status improved. Type of Study: Bedside Swallow Evaluation Diet Prior to this Study: Dysphagia 3 (soft);Thin liquids Temperature Spikes Noted:  No Respiratory Status: Room air History of Recent Intubation: No Behavior/Cognition: Alert Oral Cavity Assessment: Within Functional Limits Oral Care Completed by SLP: Recent completion by staff Oral Cavity - Dentition: Edentulous Self-Feeding Abilities: Total assist Patient Positioning: Upright in bed Baseline Vocal Quality: Not observed Volitional Cough: Cognitively unable to elicit Volitional Swallow: Unable to elicit    Oral/Motor/Sensory Function Overall Oral Motor/Sensory Function:  (grossly wfl)   Ice Chips Ice chips: Not tested   Thin Liquid Thin Liquid: Impaired Presentation: Spoon;Straw Oral Phase Functional Implications: Oral holding Pharyngeal  Phase Impairments: Suspected delayed Swallow    Nectar Thick Nectar Thick Liquid: Not tested   Honey Thick Honey Thick Liquid: Not tested   Puree Puree: Impaired Presentation: Spoon Oral Phase Functional Implications: Oral holding   Solid     Solid: Not tested      Juan Quam Laurice 07/14/2021,5:07 PM  Estill Bamberg L. Tivis Ringer, [place] Office number [phone] Pager [phone]

## 2021-07-14 NOTE — ED Notes (Signed)
Attempted IV start x 2. Placing IV team consult

## 2021-07-14 NOTE — Progress Notes (Signed)
Patient spat out meds.  Change Vimpat to IV for now.

## 2021-07-14 NOTE — ED Triage Notes (Signed)
Pt arrived to ED via [facility] from home w/ c/o suspected UTI. Pt has pending labs for UTI per family. Pt urine was noted to be cloudy which prompted family to seek evaluation recently. Pt has hx of CVA, TBI, nonverbal, and seizures.

## 2021-07-14 NOTE — ED Notes (Signed)
Pt back from CT

## 2021-07-15 DIAGNOSIS — B965 Pseudomonas (aeruginosa) (mallei) (pseudomallei) as the cause of diseases classified elsewhere: Secondary | ICD-10-CM | POA: Diagnosis present

## 2021-07-15 DIAGNOSIS — Z885 Allergy status to narcotic agent status: Secondary | ICD-10-CM | POA: Diagnosis not present

## 2021-07-15 DIAGNOSIS — E440 Moderate protein-calorie malnutrition: Secondary | ICD-10-CM | POA: Diagnosis present

## 2021-07-15 DIAGNOSIS — Z7401 Bed confinement status: Secondary | ICD-10-CM | POA: Diagnosis not present

## 2021-07-15 DIAGNOSIS — Z818 Family history of other mental and behavioral disorders: Secondary | ICD-10-CM | POA: Diagnosis not present

## 2021-07-15 DIAGNOSIS — Z79890 Hormone replacement therapy (postmenopausal): Secondary | ICD-10-CM | POA: Diagnosis not present

## 2021-07-15 DIAGNOSIS — G9341 Metabolic encephalopathy: Secondary | ICD-10-CM | POA: Diagnosis present

## 2021-07-15 DIAGNOSIS — R1310 Dysphagia, unspecified: Secondary | ICD-10-CM | POA: Diagnosis present

## 2021-07-15 DIAGNOSIS — R532 Functional quadriplegia: Secondary | ICD-10-CM | POA: Diagnosis present

## 2021-07-15 DIAGNOSIS — Z7901 Long term (current) use of anticoagulants: Secondary | ICD-10-CM | POA: Diagnosis not present

## 2021-07-15 DIAGNOSIS — Z86718 Personal history of other venous thrombosis and embolism: Secondary | ICD-10-CM | POA: Diagnosis not present

## 2021-07-15 DIAGNOSIS — G40909 Epilepsy, unspecified, not intractable, without status epilepticus: Secondary | ICD-10-CM | POA: Diagnosis present

## 2021-07-15 DIAGNOSIS — E860 Dehydration: Secondary | ICD-10-CM | POA: Diagnosis present

## 2021-07-15 DIAGNOSIS — K5909 Other constipation: Secondary | ICD-10-CM | POA: Diagnosis present

## 2021-07-15 DIAGNOSIS — E876 Hypokalemia: Secondary | ICD-10-CM | POA: Diagnosis present

## 2021-07-15 DIAGNOSIS — R4182 Altered mental status, unspecified: Secondary | ICD-10-CM | POA: Diagnosis present

## 2021-07-15 DIAGNOSIS — D6862 Lupus anticoagulant syndrome: Secondary | ICD-10-CM | POA: Diagnosis present

## 2021-07-15 DIAGNOSIS — Z85830 Personal history of malignant neoplasm of bone: Secondary | ICD-10-CM | POA: Diagnosis not present

## 2021-07-15 DIAGNOSIS — G894 Chronic pain syndrome: Secondary | ICD-10-CM | POA: Diagnosis not present

## 2021-07-15 DIAGNOSIS — Z89611 Acquired absence of right leg above knee: Secondary | ICD-10-CM | POA: Diagnosis not present

## 2021-07-15 DIAGNOSIS — F32A Depression, unspecified: Secondary | ICD-10-CM | POA: Diagnosis present

## 2021-07-15 DIAGNOSIS — Z833 Family history of diabetes mellitus: Secondary | ICD-10-CM | POA: Diagnosis not present

## 2021-07-15 DIAGNOSIS — Z79899 Other long term (current) drug therapy: Secondary | ICD-10-CM | POA: Diagnosis not present

## 2021-07-15 DIAGNOSIS — G9340 Encephalopathy, unspecified: Secondary | ICD-10-CM | POA: Diagnosis not present

## 2021-07-15 DIAGNOSIS — F419 Anxiety disorder, unspecified: Secondary | ICD-10-CM | POA: Diagnosis present

## 2021-07-15 DIAGNOSIS — N3001 Acute cystitis with hematuria: Secondary | ICD-10-CM | POA: Diagnosis present

## 2021-07-15 DIAGNOSIS — Z87820 Personal history of traumatic brain injury: Secondary | ICD-10-CM | POA: Diagnosis not present

## 2021-07-15 LAB — CBC
HCT: 31 % — ABNORMAL LOW (ref 36.0–46.0)
Hemoglobin: 9.4 g/dL — ABNORMAL LOW (ref 12.0–15.0)
MCH: 27.1 pg (ref 26.0–34.0)
MCHC: 30.3 g/dL (ref 30.0–36.0)
MCV: 89.3 fL (ref 80.0–100.0)
Platelets: 237 10*3/uL (ref 150–400)
RBC: 3.47 MIL/uL — ABNORMAL LOW (ref 3.87–5.11)
RDW: 14 % (ref 11.5–15.5)
WBC: 5.2 10*3/uL (ref 4.0–10.5)
nRBC: 0 % (ref 0.0–0.2)

## 2021-07-15 LAB — BASIC METABOLIC PANEL
Anion gap: 9 (ref 5–15)
BUN: 10 mg/dL (ref 6–20)
CO2: 28 mmol/L (ref 22–32)
Calcium: 9 mg/dL (ref 8.9–10.3)
Chloride: 105 mmol/L (ref 98–111)
Creatinine, Ser: 0.77 mg/dL (ref 0.44–1.00)
GFR, Estimated: >60 mL/min (ref >60)
Glucose, Bld: 92 mg/dL (ref 70–99)
Potassium: 3.3 mmol/L — ABNORMAL LOW (ref 3.5–5.1)
Sodium: 142 mmol/L (ref 135–145)

## 2021-07-15 LAB — PROTIME-INR
INR: 2 — ABNORMAL HIGH (ref 0.8–1.2)
Prothrombin Time: 22.9 seconds — ABNORMAL HIGH (ref 11.4–15.2)

## 2021-07-15 LAB — IRON AND TIBC
Iron: 14 ug/dL — ABNORMAL LOW (ref 28–170)
Saturation Ratios: 7 % — ABNORMAL LOW (ref 10.4–31.8)
TIBC: 195 ug/dL — ABNORMAL LOW (ref 250–450)
UIBC: 181 ug/dL

## 2021-07-15 LAB — FERRITIN: Ferritin: 248 ng/mL (ref 11–307)

## 2021-07-15 MED ORDER — WARFARIN SODIUM 5 MG PO TABS: 5.0000 mg | ORAL_TABLET | Freq: Once | ORAL | Status: DC

## 2021-07-15 MED ORDER — PANTOPRAZOLE SODIUM 40 MG IV SOLR
40.0000 mg | INTRAVENOUS | Status: DC
  Administered 2021-07-15 – 2021-07-20 (×6): 40 mg via INTRAVENOUS
  Filled 2021-07-15 (×6): qty 10

## 2021-07-15 MED ORDER — ENOXAPARIN SODIUM 80 MG/0.8ML IJ SOSY
75.0000 mg | PREFILLED_SYRINGE | Freq: Two times a day (BID) | INTRAMUSCULAR | Status: DC
  Administered 2021-07-15 (×2): 75 mg via SUBCUTANEOUS
  Filled 2021-07-15 (×3): qty 0.8

## 2021-07-15 MED ORDER — WARFARIN - PHARMACIST DOSING INPATIENT: Freq: Every day | Status: DC

## 2021-07-15 MED ORDER — ENSURE ENLIVE PO LIQD
237.0000 mL | Freq: Two times a day (BID) | ORAL | Status: DC
  Administered 2021-07-15 – 2021-07-17 (×5): 237 mL via ORAL
  Filled 2021-07-15: qty 237

## 2021-07-15 MED ORDER — ADULT MULTIVITAMIN W/MINERALS CH
1.0000 | ORAL_TABLET | Freq: Every day | ORAL | Status: DC
  Administered 2021-07-16 – 2021-07-19 (×4): 1 via ORAL
  Filled 2021-07-15 (×5): qty 1

## 2021-07-15 MED ORDER — PIPERACILLIN-TAZOBACTAM 3.375 G IVPB
3.3750 g | Freq: Three times a day (TID) | INTRAVENOUS | Status: AC
  Administered 2021-07-15 – 2021-07-17 (×8): 3.375 g via INTRAVENOUS
  Filled 2021-07-15 (×8): qty 50

## 2021-07-15 NOTE — Plan of Care (Signed)
  Problem: Clinical Measurements: Goal: Respiratory complications will improve Outcome: Progressing Goal: Cardiovascular complication will be avoided Outcome: Progressing   Problem: Coping: Goal: Level of anxiety will decrease Outcome: Progressing   Problem: Elimination: Goal: Will not experience complications related to urinary retention Outcome: Progressing   Problem: Pain Managment: Goal: General experience of comfort will improve Outcome: Progressing   Problem: Safety: Goal: Ability to remain free from injury will improve Outcome: Progressing   Problem: Education: Goal: Knowledge of General Education information will improve Description: Including pain rating scale, medication(s)/side effects and non-pharmacologic comfort measures Outcome: Not Progressing   Problem: Health Behavior/Discharge Planning: Goal: Ability to manage health-related needs will improve Outcome: Not Progressing   Problem: Clinical Measurements: Goal: Ability to maintain clinical measurements within normal limits will improve Outcome: Not Progressing Goal: Will remain free from infection Outcome: Not Progressing Goal: Diagnostic test results will improve Outcome: Not Progressing   Problem: Activity: Goal: Risk for activity intolerance will decrease Outcome: Not Progressing   Problem: Nutrition: Goal: Adequate nutrition will be maintained Outcome: Not Progressing   Problem: Elimination: Goal: Will not experience complications related to bowel motility Outcome: Not Progressing   Problem: Skin Integrity: Goal: Risk for impaired skin integrity will decrease Outcome: Not Progressing

## 2021-07-15 NOTE — Progress Notes (Signed)
Progress Note   Patient: Cassandra Andrade RFF:[id number] DOB: 09/11/1965 DOA: 07/14/2021     0 DOS: the patient was seen and examined on 07/15/2021   Brief hospital course:  56 y.o. 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   Mother at bedside reported in bed last 2 days, patient has been eating and drinking very little, "body hot to touch" yesterday but no documented fever.  And patient's urine smell strong.  Meantime, mother noticed that the patient became confused and stopped talking altogether for the last 2 days.  No shaking-like body movement as per mother's observation and no fall.  Patient has history of dysphagia on pured diet but mother suspect patient not eating enough.  Mother denied patient ever complaining about abdominal pain.  And patient has a chronic constipation.  Overall, mother suspected patient has a new onset of UTI and brought her to ED.   Assessment and Plan: No notes have been filed under this hospital service. Service: Hospitalist Acute metabolic encephalopathy -Likely secondary to UTI and dehydration -No symptoms or signs of breakthrough seizure this point. -CT head unremarkable. -Continue ceftriaxone -cont IVF -Initial question about feeding tubes. Per SLP, pt declined. Cleared for dysphagia 1 -Hopefully as UTI is treated, mentation improves, and pt would tolerate more PO   UTI -No Hx of resistant organism infection -continued ceftriaxone empirically -Urine cx now pos for pseudomonas. Will change to zosyn   Dehydration -Symptoms and signs of volume contraction -Clinically appear dry on exam -Cont IVF as tolerated   Chronic dysphagia -According to  mother may also has decreased appetite chronically -Started Megace -SLP consulted   Seizure disorder -As above, no acute concern of breakthrough seizure.  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        Subjective: Very tired appearing this AM, difficult to assess  Physical Exam: Vitals:   07/14/21 2324 07/15/21 0340 07/15/21 0751 07/15/21 1140  BP: 120/68 124/71 121/69 123/75  Pulse: 95 89 90 95  Resp: '17 17 18 20  '$ Temp: 98.4 F (36.9 C) 98.2 F (36.8 C) 97.8 F (36.6 C) 98 F (36.7 C)  TempSrc: Oral  Oral Oral  SpO2: 100% 100% 100% 100%  Weight:      Height:       General exam: Tired appearing, arousable, laying in bed, in nad, membranes appear dry Respiratory system: Normal respiratory effort, no wheezing Cardiovascular system: regular rate, s1, s2 Gastrointestinal system: Soft, nondistended, positive BS Central nervous system: CN2-12 grossly intact, strength intact Extremities: Perfused, no clubbing Skin: Normal skin turgor, no notable skin lesions seen Psychiatry: Difficult to assess given level of alertness  Data Reviewed:  Labs reviewed: K 3.3, WBC 5.2, urine cx >100,000 pseudomonas  Family Communication: Pt in room, family not at bedside  Disposition: Status is: Observation The patient will require care spanning > 2 midnights and should be moved to inpatient because: Severity of illness  Planned Discharge Destination:  Uncertain at this time  Author: Marylu Lund, MD 07/15/2021 12:18 PM  For on call review [email or web address].

## 2021-07-15 NOTE — Progress Notes (Signed)
Initial Nutrition Assessment  DOCUMENTATION CODES:  Non-severe (moderate) malnutrition in context of chronic illness  INTERVENTION:  Continue current diet as ordered Magic cup TID with meals, each supplement provides 290 kcal and 9 grams of protein Ensure Enlive po BID, each supplement provides 350 kcal and 20 grams of protein. Monitor GOC  NUTRITION DIAGNOSIS:  Moderate Malnutrition (in the context of chronic illness) related to poor appetite as evidenced by mild fat depletion, mild muscle depletion, moderate muscle depletion.  GOAL:  Patient will meet greater than or equal to 90% of their needs  MONITOR:  PO intake, Supplement acceptance, Labs  REASON FOR ASSESSMENT:  Consult Assessment of nutrition requirement/status, Calorie Count  ASSESSMENT:  Pt with hx osteosarcoma s/p right BKA, intracranial hemorrhage s/p ventricular shunt, and seizure disorder presented to ED with a suspected UTI. Pt on pured diet at baseline. Functional quadriplegia and bedbound.   Pt resting in bed at the time of assessment. Sleeping, but did wake to name being called and during physical exam. Did mumble a few words, but did not answer any nutrition related questions. Breakfast tray at bedside, minimally touched (bites missing). RN reports pt didn't eat anything this AM.  Consult received to start kcal count, hung envelope on door and discussed with RN.   Of note - pt was followed by nutrition team during last admission 4/18-4/26 and is known to this RD. During last admission, palliative care worked with family on goals of care and which procedures she would want.   Per Palliative note 4/26: "I briefly reviewed feeding tube risks and benefits. Before I completed my thoughts, patient's eyes got big, and she was very clear when she said strongly "no no no". When asked if she would want a temporary feeding tube to bridge her to being able to eat/drink on her own, she said no. When asked if she would want a  feeding tube in order to sustain her life she said no."  Pt ate poorly during last admission and did not consume supplements well. Will request new measured weight as the current one appears to be copied forward from last admission.  Nutritionally Relevant Medications: Scheduled Meds:  megestrol  400 mg Oral Daily   pantoprazole  40 mg Oral Daily   prenatal multivitamin  1 tablet Oral Daily   Continuous Infusions:  sodium chloride 150 mL/hr at 07/15/21 0249   cefTRIAXone (ROCEPHIN)  IV     lacosamide (VIMPAT) IV 200 mg (07/14/21 2254)   PRN Meds: ondansetron, promethazine  Labs Reviewed: K 3.3 INR 2.0  NUTRITION - FOCUSED PHYSICAL EXAM: Flowsheet Row Most Recent Value  Orbital Region Mild depletion  Upper Arm Region No depletion  Thoracic and Lumbar Region No depletion  Buccal Region Mild depletion  Temple Region Severe depletion  Clavicle Bone Region Mild depletion  Clavicle and Acromion Bone Region Mild depletion  Scapular Bone Region No depletion  Dorsal Hand No depletion  Patellar Region Mild depletion  Anterior Thigh Region Moderate depletion  Posterior Calf Region Severe depletion  Edema (RD Assessment) None  Hair Unable to assess  [wrap]  Eyes Unable to assess  Mouth Reviewed  Skin Reviewed  Nails Reviewed    Diet Order:   Diet Order             DIET - DYS 1 Room service appropriate? Yes with Assist; Fluid consistency: Thin  Diet effective now  EDUCATION NEEDS:  No education needs have been identified at this time  Skin:  Skin Assessment: Reviewed RN Assessment  Last BM:  prior to admission  Height:  Ht Readings from Last 1 Encounters:  07/14/21 '5\' 6"'$  (1.676 m)    Weight:  Wt Readings from Last 1 Encounters:  07/14/21 76.6 kg    Ideal Body Weight:  53.2 kg (adjusted by 10% for AKA)  BMI:  Body mass index is 30.3 kg/m. (Adjusted for AKA, using 76.6kg)  Estimated Nutritional Needs:  Kcal:  1800-2000 kcal/d Protein:   90-105 g/d Fluid:  >/= 2L/d   Ranell Patrick, RD, LDN Clinical Dietitian RD pager # available in AMION  After hours/weekend pager # available in [facility]

## 2021-07-15 NOTE — Progress Notes (Addendum)
ANTICOAGULATION CONSULT NOTE - Follow Up Consult  Pharmacy Consult for warfarin - to Lovenox Indication: History of DVT, lupus anticoagulant disorder  Allergies  Allergen Reactions   Vicodin [Hydrocodone-Acetaminophen] Nausea And Vomiting   Morphine Rash   Morphine And Related Rash    Allergic reaction only in iv form    Patient Measurements: Height: '5\' 6"'$  (167.6 cm) Weight: 76.6 kg (168 lb 14 oz) IBW/kg (Calculated) : 59.3  Vital Signs: Temp: 97.8 F (36.6 C) (05/24 0751) Temp Source: Oral (05/24 0751) BP: 121/69 (05/24 0751) Pulse Rate: 90 (05/24 0751)  Labs: Recent Labs    07/14/21 0954 07/14/21 2020 07/15/21 0457  HGB 10.6*  --  9.4*  HCT 36.1  --  31.0*  PLT 243  --  237  LABPROT  --  76.1* 22.9*  INR  --  9.5* 2.0*  CREATININE 1.00  --  0.77  CKTOTAL  --  140  --      Estimated Creatinine Clearance: 84 mL/min (by C-G formula based on SCr of 0.77 mg/dL).   Medications:  Scheduled:   lamoTRIgine  350 mg Oral BID   levothyroxine  50 mcg Oral QPM   megestrol  400 mg Oral Daily   pantoprazole  40 mg Oral Daily   prenatal multivitamin  1 tablet Oral Daily   sodium chloride flush  10-40 mL Intracatheter Q12H   venlafaxine XR  150 mg Oral Q breakfast    Assessment: 56 y.o. 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 Pharmacy consulted for resumption of home warfarin.   PTA warfarin as of 06/16/21: '10mg'$  daily except 7.'5mg'$  Sun and Wed INR on admit 9.5 - received Vitamin K History of elevated INRs on recent admissions  INR reversed - > 2 today - starting Lovenox today  Goal of Therapy:  INR 2-3 Monitor platelets by anticoagulation protocol: Yes    Plan:  Lovenox 75 mg sq Q 12 hours while NPO Monitor for signs/symptoms of bleed  Thank you Anette Guarneri, PharmD  Please check AMION for all [place] numbers After 10:00 PM, call [place]

## 2021-07-15 NOTE — Hospital Course (Signed)
56 y.o. 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   Mother at bedside reported in bed last 2 days, patient has been eating and drinking very little, "body hot to touch" yesterday but no documented fever.  And patient's urine smell strong.  Meantime, mother noticed that the patient became confused and stopped talking altogether for the last 2 days.  No shaking-like body movement as per mother's observation and no fall.  Patient has history of dysphagia on pured diet but mother suspect patient not eating enough.  Mother denied patient ever complaining about abdominal pain.  And patient has a chronic constipation.  Overall, mother suspected patient has a new onset of UTI and brought her to ED.

## 2021-07-16 DIAGNOSIS — G9341 Metabolic encephalopathy: Secondary | ICD-10-CM | POA: Diagnosis not present

## 2021-07-16 DIAGNOSIS — E876 Hypokalemia: Secondary | ICD-10-CM

## 2021-07-16 DIAGNOSIS — E440 Moderate protein-calorie malnutrition: Secondary | ICD-10-CM | POA: Diagnosis present

## 2021-07-16 LAB — COMPREHENSIVE METABOLIC PANEL
ALT: 18 U/L (ref 0–44)
AST: 24 U/L (ref 15–41)
Albumin: 2.4 g/dL — ABNORMAL LOW (ref 3.5–5.0)
Alkaline Phosphatase: 87 U/L (ref 38–126)
Anion gap: 8 (ref 5–15)
BUN: 6 mg/dL (ref 6–20)
CO2: 29 mmol/L (ref 22–32)
Calcium: 9 mg/dL (ref 8.9–10.3)
Chloride: 103 mmol/L (ref 98–111)
Creatinine, Ser: 0.7 mg/dL (ref 0.44–1.00)
GFR, Estimated: >60 mL/min (ref >60)
Glucose, Bld: 137 mg/dL — ABNORMAL HIGH (ref 70–99)
Potassium: 2.8 mmol/L — ABNORMAL LOW (ref 3.5–5.1)
Sodium: 140 mmol/L (ref 135–145)
Total Bilirubin: 0.4 mg/dL (ref 0.3–1.2)
Total Protein: 6.4 g/dL — ABNORMAL LOW (ref 6.5–8.1)

## 2021-07-16 LAB — MAGNESIUM: Magnesium: 1.5 mg/dL — ABNORMAL LOW (ref 1.7–2.4)

## 2021-07-16 LAB — PROTIME-INR
INR: 1.5 — ABNORMAL HIGH (ref 0.8–1.2)
Prothrombin Time: 17.6 seconds — ABNORMAL HIGH (ref 11.4–15.2)

## 2021-07-16 LAB — CBC
HCT: 30.1 % — ABNORMAL LOW (ref 36.0–46.0)
Hemoglobin: 9.5 g/dL — ABNORMAL LOW (ref 12.0–15.0)
MCH: 27.5 pg (ref 26.0–34.0)
MCHC: 31.6 g/dL (ref 30.0–36.0)
MCV: 87 fL (ref 80.0–100.0)
Platelets: 260 10*3/uL (ref 150–400)
RBC: 3.46 MIL/uL — ABNORMAL LOW (ref 3.87–5.11)
RDW: 13.9 % (ref 11.5–15.5)
WBC: 5.9 10*3/uL (ref 4.0–10.5)
nRBC: 0 % (ref 0.0–0.2)

## 2021-07-16 MED ORDER — POTASSIUM CHLORIDE CRYS ER 20 MEQ PO TBCR
40.0000 meq | EXTENDED_RELEASE_TABLET | Freq: Once | ORAL | Status: AC
Start: 2021-07-16 — End: 2021-07-16
  Administered 2021-07-16: 40 meq via ORAL
  Filled 2021-07-16: qty 2

## 2021-07-16 MED ORDER — MAGNESIUM SULFATE 2 GM/50ML IV SOLN
2.0000 g | Freq: Once | INTRAVENOUS | Status: AC
  Administered 2021-07-16: 2 g via INTRAVENOUS
  Filled 2021-07-16: qty 50

## 2021-07-16 MED ORDER — WARFARIN - PHARMACIST DOSING INPATIENT: Freq: Every day | Status: DC

## 2021-07-16 MED ORDER — WARFARIN SODIUM 5 MG PO TABS
10.0000 mg | ORAL_TABLET | Freq: Once | ORAL | Status: AC
  Administered 2021-07-16: 10 mg via ORAL
  Filled 2021-07-16: qty 2

## 2021-07-16 MED ORDER — ENOXAPARIN SODIUM 60 MG/0.6ML IJ SOSY
60.0000 mg | PREFILLED_SYRINGE | Freq: Two times a day (BID) | INTRAMUSCULAR | Status: DC
Start: 2021-07-16 — End: 2021-07-19
  Administered 2021-07-16 – 2021-07-18 (×6): 60 mg via SUBCUTANEOUS
  Filled 2021-07-16 (×5): qty 0.6

## 2021-07-16 MED ORDER — POTASSIUM CHLORIDE 10 MEQ/100ML IV SOLN
10.0000 meq | INTRAVENOUS | Status: AC
  Administered 2021-07-16 (×6): 10 meq via INTRAVENOUS
  Filled 2021-07-16 (×5): qty 100

## 2021-07-16 NOTE — Progress Notes (Signed)
Calorie Count Note  48-hour calorie count ordered.  Pt with ongoing poor appetite yesterday. Pt consuming very little off of meal trays, mostly eating magic cups. Nutrition needs are not being met. Pt told palliative care she would not want a feeding tube during last admission, may need to re-address GOC if intake does not improve and determine if more agressive interventions should be considered.  Diet: DYS 1 with thin liquids Supplements: Ensure Enlive TID, MC TID  Breakfast 5/24: 0kcal, 0g protein Lunch 5/24: 290kcal, 9g of protein Dinner 5/24: 306kcal, 11g of protein Supplements: 1/2 ensure (175kcal, 10g of protein)  Total intake: 771 kcal (43% of minimum estimated needs)  30 protein (33% of minimum estimated needs)  NUTRITION DIAGNOSIS:  Moderate Malnutrition (in the context of chronic illness) related to poor appetite as evidenced by mild fat depletion, mild muscle depletion, moderate muscle depletion.  GOAL:  Patient will meet greater than or equal to 90% of their needs  INTERVENTION:  Continue current diet as ordered Magic cup TID with meals, each supplement provides 290 kcal and 9 grams of protein Ensure Enlive po BID, each supplement provides 350 kcal and 20 grams of protein. Monitor GOC  Ranell Patrick, RD, LDN Clinical Dietitian RD pager # available in [facility]  After hours/weekend pager # available in [facility]

## 2021-07-16 NOTE — TOC Initial Note (Signed)
Transition of Care [facility]) - Initial/Assessment Note    Patient Details  Name: Cassandra Andrade MRN: [id number] Date of Birth: 05/17/1965  [facility] CM/SW Contact:    Pollie Friar, RN Phone Number: 07/16/2021, 12:40 PM  Clinical Narrative:                 Patient admitted with encephalopathy. She lives at home with her mother. Her mother is paid for her care under the CAP program.  Last admission pt discharged with [facility] services ([place]) and outpatient palliative care ([facility]). HH has signed off and the mother cancelled palliative care.  Pt has needed DME at home.  CM spoke with the patients mother and she wants the patient to return home at d/c. Pt will need PTAR home.  TOC following.  Expected Discharge Plan: Home/Self Care Barriers to Discharge: Continued Medical Work up   Patient Goals and CMS Choice     Choice offered to / list presented to : Parent  Expected Discharge Plan and Services Expected Discharge Plan: Home/Self Care   Discharge Planning Services: CM Consult   Living arrangements for the past 2 months: Single Family Home                                      Prior Living Arrangements/Services Living arrangements for the past 2 months: Single Family Home Lives with:: Parents Patient language and need for interpreter reviewed:: Yes Do you feel safe going back to the place where you live?: Yes      Need for Family Participation in Patient Care: Yes (Comment) Care giver support system in place?: Yes (comment) Current home services: DME (hospital bed/ wheelchair/ hoyer) Criminal Activity/Legal Involvement Pertinent to Current Situation/Hospitalization: No - Comment as needed  Activities of Daily Living Home Assistive Devices/Equipment: [employer] fast streamer, Wheelchair ADL Screening (condition at time of admission) Patient's cognitive ability adequate to safely complete daily activities?: No Is the patient deaf or have difficulty hearing?:  No Does the patient have difficulty seeing, even when wearing glasses/contacts?: No Does the patient have difficulty concentrating, remembering, or making decisions?: Yes Patient able to express need for assistance with ADLs?: No Does the patient have difficulty dressing or bathing?: Yes Independently performs ADLs?: No Communication: Dependent Is this a change from baseline?: Pre-admission baseline Dressing (OT): Dependent Is this a change from baseline?: Pre-admission baseline Grooming: Dependent Is this a change from baseline?: Pre-admission baseline Feeding: Dependent Is this a change from baseline?: Pre-admission baseline Bathing: Dependent Is this a change from baseline?: Pre-admission baseline Toileting: Dependent Is this a change from baseline?: Pre-admission baseline In/Out Bed: Dependent Is this a change from baseline?: Pre-admission baseline Walks in Home: Dependent Is this a change from baseline?: Pre-admission baseline Does the patient have difficulty walking or climbing stairs?: Yes Weakness of Legs: Both Weakness of Arms/Hands: Both  Permission Sought/Granted                  Emotional Assessment Appearance:: Appears stated age         Psych Involvement: No (comment)  Admission diagnosis:  Encephalopathy [G93.40] Acute cystitis with hematuria [N30.01] Altered mental status, unspecified altered mental status type [R41.82] Pseudomonas urinary tract infection [N39.0, B96.5] Patient Active Problem List   Diagnosis Date Noted   Malnutrition of moderate degree 07/16/2021   Hypokalemia 07/16/2021   Hypomagnesemia 07/16/2021   Pseudomonas urinary tract infection 07/15/2021  Encephalopathy 07/14/2021   Protein-calorie malnutrition, severe 06/11/2021   Elevated CK 06/10/2021   AKI (acute kidney injury) ([place]) 06/10/2021   Aspiration pneumonia ([place]) 06/10/2021   Constipation 00/45/9977   Acute metabolic encephalopathy 41/42/3953   Sepsis ([place]) 06/09/2021    Supratherapeutic INR 06/09/2021   Hypernatremia 06/09/2021   Dehydration 06/09/2021   UTI (urinary tract infection) 06/09/2021   History of deep venous thrombosis 05/05/2015   Personal history of osteosarcoma 05/05/2015   Anemia 11/04/2014   Abnormal finding on mammography 05/18/2014   Major depression, recurrent ([place]) 03/19/2014   Vision disturbance 02/28/2014   Postprocedural state 12/31/2013   History of biliary T-tube placement 12/31/2013   Lupus anticoagulant disorder ([place]) 12/20/2013   Back pain, chronic 11/07/2013   Absence of bladder continence 08/05/2013   History of anticoagulant therapy 07/17/2013   Long term current use of anticoagulant therapy 07/17/2013   Partial epilepsy with impairment of consciousness ([place]) 06/21/2013   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

## 2021-07-16 NOTE — Progress Notes (Addendum)
ANTICOAGULATION CONSULT NOTE - Follow Up Consult  Pharmacy Consult for Lovenox to warfarin Indication: History of DVT, lupus anticoagulant disorder  Allergies  Allergen Reactions   Vicodin [Hydrocodone-Acetaminophen] Nausea And Vomiting   Morphine Rash   Morphine And Related Rash    Allergic reaction only in iv form    Patient Measurements: Height: '5\' 6"'$  (167.6 cm) Weight: 63.7 kg (140 lb 6.9 oz) IBW/kg (Calculated) : 59.3  Vital Signs: Temp: 99.4 F (37.4 C) (05/25 0747) Temp Source: Oral (05/25 0747) BP: 123/65 (05/25 0747) Pulse Rate: 89 (05/25 0747)  Labs: Recent Labs    07/14/21 0954 07/14/21 2020 07/15/21 0457 07/16/21 0615  HGB 10.6*  --  9.4* 9.5*  HCT 36.1  --  31.0* 30.1*  PLT 243  --  237 260  LABPROT  --  76.1* 22.9* 17.6*  INR  --  9.5* 2.0* 1.5*  CREATININE 1.00  --  0.77 0.70  CKTOTAL  --  140  --   --      Estimated Creatinine Clearance: 75.3 mL/min (by C-G formula based on SCr of 0.7 mg/dL).  Assessment: 56 y.o. 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 Pharmacy consulted for resumption of home warfarin.   PTA warfarin as of 06/16/21: '10mg'$  daily except 7.'5mg'$  Sun and Wed INR on admit 9.5 - received Vitamin K History of elevated INRs on recent admissions  INR reversed on 5/23 --> 1.5 today - started Lovenox 5/24. Weight updated this morning. No bleeding noted, CBC stable.  Goal of Therapy:  Anti-Xa level 0.6-1 units/ml 4hrs after LMWH dose given Monitor platelets by anticoagulation protocol: Yes   Plan:  Change Lovenox to 60 mg SQ q12h while NPO Monitor for signs/symptoms of bleed  Thank you for involving pharmacy in this patient's  care.  Renold Genta, PharmD, BCPS Clinical Pharmacist Clinical phone for 07/16/2021 until 3p is [phone] 07/16/2021 8:38 AM  **Pharmacist phone directory can be found on [email or web address] listed under [place]**  Addendum: Consulted to resume warfarin. INR subtherapeutic after reversal.  Warfarin 10 mg PO tonight  Daily INR Discontinue Lovenox when INR >2 x 24h  Renold Genta, PharmD, BCPS 10:54 AM

## 2021-07-16 NOTE — Progress Notes (Signed)
PROGRESS NOTE Georgine Wiltse  GLO:[id number] DOB: Jul 27, 1965 DOA: 07/14/2021 PCP: Berkley Harvey, NP   Brief Narrative/Hospital Course: 56 y.o. 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 Per mother patient has been eating and drinking very little, "body hot to touch" , urine smelly, patient became confused and stopped talking altogether for the last 2 days PTA.  In ED- found to have UTI/ Acute metabolic encephalopathy, dehydration. Urine culture with pseudomonas.    Subjective: Seen and examined this morning.  He is looking, alert awake oriented to self current place, thinks Trump is the president, somewhat mildly lethargic but interactive right AKA, unable to move extremities able to squeeze lightly on the left arm.  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 Now more verbal,alert awake but just lethargic and weak.  Continue IV antibiotics, fall precaution delirium precaution seizure precaution.  Continue her home  Vimpat, Lamictal.  Pseudomonas urinary tract infection: Continue current Zosyn iv.  Malnutrition of moderate degree Chronic dysphagia: start on Megace, speech consulted-on dysphagia 1 diet, continue PPI.  Continue to augment nutritional status  Hypokalemia: We will replete p.o. and IV, check mag  Dehydration: Encourage oral intake  S/P BKA  rt  History of deep venous thrombosis Lupus anticoagulant disorder: Patient is stopping Coumadin, not transthoracic to Lovenox-pharmacy consult to resume Coumadin and dose, subtherapeutic today. Recent Labs  Lab 07/14/21 2020 07/15/21 0457 07/16/21 0615  INR 9.5* 2.0* 1.5*    Chronic pain associated with significant psychosocial dysfunction Depression: Continue pain control, Effexor  DVT prophylaxis:   Lovenox Code Status:   Code Status: Full Code Family Communication: plan of care discussed with patient/no family at bedside. Patient status is: Inpatient because of ongoing management of UTI, encephalopathy Level of care: Med-Surg   Dispo: The patient is from: home, lives with daughter            Anticipated disposition: home, anticipate discharge in next 2 days  Mobility Assessment (last 72 hours)     Mobility Assessment     Row Name 07/15/21 0900 07/14/21 2048 07/14/21 1800       Does patient have an order for bedrest or is patient medically unstable Yes- Bedfast (Level 1) - Complete Yes- Bedfast (Level 1) - Complete Yes- Bedfast (Level 1) - Complete     What is the highest level of mobility based on the progressive mobility assessment? Level 1 (Bedfast) - Unable to balance while sitting on edge of bed Level 1 (Bedfast) - Unable to balance while sitting on edge of bed Level 1 (Bedfast) - Unable to balance while sitting on edge of bed  Is the above level different from baseline mobility prior to current illness? Yes - Recommend PT order -- Yes - Recommend PT order               Objective: Vitals last 24 hrs: Vitals:    07/16/21 0339 07/16/21 0500 07/16/21 0747 07/16/21 0829  BP: 119/67  123/65   Pulse: 92  89   Resp: 17  17   Temp: 99.1 F (37.3 C)  99.4 F (37.4 C)   TempSrc: Oral  Oral   SpO2: 100%  100%   Weight:  63.6 kg  63.7 kg  Height:       Weight change: -13 kg  Physical Examination:  General exam: Lethargic, chronically sick looking,oriented x2 HEENT:Oral mucosa moist, Ear/Nose WNL grossly, dentition normal. Respiratory system: bilaterally diminished BS, no use of accessory muscle Cardiovascular system: S1 & S2 +, No JVD. Gastrointestinal system: Abdomen soft,NT,ND, BS+ Nervous System: Able to squeeze left arm some unable to move the rest of the arms, right AKA  Extremities: rt AKA, mild swelling left lower extremity,distal peripheral pulses palpable.  Skin: No rashes,no icterus. MSK: Normal muscle bulk,tone, power Previous tracheostomy scar  Medications reviewed:  Scheduled Meds:  enoxaparin (LOVENOX) injection  60 mg Subcutaneous Q12H   feeding supplement  237 mL Oral BID BM   lamoTRIgine  350 mg Oral BID   megestrol  400 mg Oral Daily   multivitamin with minerals  1 tablet Oral Daily   pantoprazole (PROTONIX) IV  40 mg Intravenous Q24H   sodium chloride flush  10-40 mL Intracatheter Q12H   venlafaxine XR  150 mg Oral Q breakfast   Continuous Infusions:  lacosamide (VIMPAT) IV 200 mg (07/16/21 1024)   piperacillin-tazobactam (ZOSYN)  IV 3.375 g (07/16/21 0544)      Diet Order             DIET - DYS 1 Room service appropriate? Yes with Assist; Fluid consistency: Thin  Diet effective now                    Nutrition Problem: Moderate Malnutrition (in the context of chronic illness) Etiology: poor appetite Signs/Symptoms: mild fat depletion, mild muscle depletion, moderate muscle depletion Interventions: Refer to RD note for recommendations   Intake/Output Summary (Last 24 hours) at 07/16/2021 1027 Last data filed at 07/15/2021 2300 Gross per 24 hour  Intake  3296.34 ml  Output 800 ml  Net 2496.34 ml   Net IO Since Admission: 2,121.34 mL [07/16/21 1027]  Wt Readings from Last 3 Encounters:  07/16/21 63.7 kg  06/24/21 76.6 kg  06/13/21 76.6 kg     Unresulted Labs (From admission, onward)     Start     Ordered   07/15/21 0500  Protime-INR  Daily,   R     Question:  Specimen collection method  Answer:  IV Team=IV Team collect   07/14/21 2155   07/15/21 0500  CBC  Daily,   R     Question:  Specimen collection method  Answer:  IV Team=IV Team collect   07/14/21 2155          Data Reviewed: I have personally reviewed following labs and imaging studies CBC: Recent Labs  Lab 07/14/21 0954 07/15/21 0457 07/16/21 0615  WBC 5.0 5.2 5.9  NEUTROABS 2.5  --   --   HGB 10.6* 9.4* 9.5*  HCT 36.1 31.0* 30.1*  MCV 92.6 89.3 87.0  PLT 243 237 260   Basic  Metabolic Panel: Recent Labs  Lab 07/14/21 0954 07/15/21 0457 07/16/21 0615  NA 139 142 140  K 4.3 3.3* 2.8*  CL 102 105 103  CO2 '26 28 29  '$ GLUCOSE 89 92 137*  BUN '12 10 6  '$ CREATININE 1.00 0.77 0.70  CALCIUM 9.6 9.0 9.0   GFR: Estimated Creatinine Clearance: 75.3 mL/min (by C-G formula based on SCr of 0.7 mg/dL). Liver Function Tests: Recent Labs  Lab 07/14/21 0954 07/16/21 0615  AST 22 24  ALT 16 18  ALKPHOS 92 87  BILITOT 0.4 0.4  PROT 6.8 6.4*  ALBUMIN 2.8* 2.4*   No results for input(s): LIPASE, AMYLASE in the last 168 hours. No results for input(s): AMMONIA in the last 168 hours. Coagulation Profile: Recent Labs  Lab 07/14/21 2020 07/15/21 0457 07/16/21 0615  INR 9.5* 2.0* 1.5*   BNP (last 3 results) No results for input(s): PROBNP in the last 8760 hours. HbA1C: No results for input(s): HGBA1C in the last 72 hours. CBG: No results for input(s): GLUCAP in the last 168 hours. Lipid Profile: No results for input(s): CHOL, HDL, LDLCALC, TRIG, CHOLHDL, LDLDIRECT in the last 72 hours. Thyroid Function Tests: No results for input(s): TSH, T4TOTAL, FREET4,  T3FREE, THYROIDAB in the last 72 hours. Sepsis Labs: No results for input(s): PROCALCITON, LATICACIDVEN in the last 168 hours.  Recent Results (from the past 240 hour(s))  Urine Culture     Status: Abnormal (Preliminary result)   Collection Time: 07/14/21 11:17 AM   Specimen: In/Out Cath Urine  Result Value Ref Range Status   Specimen Description IN/OUT CATH URINE  Final   Special Requests NONE  Final   Culture (A)  Final    >=100,000 COLONIES/mL PSEUDOMONAS AERUGINOSA CULTURE REINCUBATED FOR BETTER GROWTH SUSCEPTIBILITIES TO FOLLOW    Report Status PENDING  Incomplete   Organism ID, Bacteria PSEUDOMONAS AERUGINOSA (A)  Final      Susceptibility   Pseudomonas aeruginosa - MIC*    CEFTAZIDIME 4 SENSITIVE Sensitive     CIPROFLOXACIN <=0.25 SENSITIVE Sensitive     GENTAMICIN <=1 SENSITIVE Sensitive     IMIPENEM 2 SENSITIVE Sensitive     PIP/TAZO 8 SENSITIVE Sensitive     CEFEPIME Value in next row Sensitive      2 SENSITIVEPerformed at [facility] [address] [place], [place] [address]    * >=100,000 COLONIES/mL PSEUDOMONAS AERUGINOSA    Antimicrobials: Anti-infectives (From admission, onward)    Start     Dose/Rate Route Frequency Ordered Stop   07/15/21 1400  piperacillin-tazobactam (ZOSYN) IVPB 3.375 g        3.375 g 12.5 mL/hr over 240 Minutes Intravenous Every 8 hours 07/15/21 1241     07/15/21 1000  cefTRIAXone (ROCEPHIN) 1 g in sodium chloride 0.9 % 100 mL IVPB  Status:  Discontinued        1 g 200 mL/hr over 30 Minutes Intravenous Every 24 hours 07/14/21 1352 07/15/21 1241   07/14/21 1300  cefTRIAXone (ROCEPHIN) 2 g in sodium chloride 0.9 % 100 mL IVPB        2 g 200 mL/hr over 30 Minutes Intravenous  Once 07/14/21 1259 07/14/21 1456      Culture/Microbiology    Component Value Date/Time   SDES IN/OUT CATH URINE 07/14/2021 1117   SPECREQUEST NONE 07/14/2021 1117   CULT (A) 07/14/2021 1117    >=100,000 COLONIES/mL PSEUDOMONAS AERUGINOSA CULTURE  REINCUBATED FOR BETTER GROWTH SUSCEPTIBILITIES TO FOLLOW    REPTSTATUS PENDING 07/14/2021 1117  Radiology Studies: DG Chest 1 View  Result Date: 07/14/2021 CLINICAL DATA:  Altered mental status, possible aspiration. EXAM: CHEST  1 VIEW COMPARISON:  June 09, 2021 radiograph FINDINGS: Stimulating generator in the left chest with lead projecting over the left neck. The heart size and mediastinal contours are within normal limits. No focal airspace consolidation. No visible pleural effusion or pneumothorax. The visualized skeletal structures are unremarkable. IMPRESSION: No acute cardiopulmonary findings. Electronically Signed   By: Dahlia Bailiff M.D.   On: 07/14/2021 12:32     LOS: 1 day   Antonieta Pert, MD [facility]  07/16/2021, 10:27 AM

## 2021-07-17 DIAGNOSIS — L8990 Pressure ulcer of unspecified site, unspecified stage: Secondary | ICD-10-CM | POA: Insufficient documentation

## 2021-07-17 DIAGNOSIS — G9341 Metabolic encephalopathy: Secondary | ICD-10-CM | POA: Diagnosis not present

## 2021-07-17 LAB — BASIC METABOLIC PANEL
Anion gap: 8 (ref 5–15)
BUN: 6 mg/dL (ref 6–20)
CO2: 27 mmol/L (ref 22–32)
Calcium: 9.1 mg/dL (ref 8.9–10.3)
Chloride: 105 mmol/L (ref 98–111)
Creatinine, Ser: 0.66 mg/dL (ref 0.44–1.00)
GFR, Estimated: >60 mL/min (ref >60)
Glucose, Bld: 96 mg/dL (ref 70–99)
Potassium: 4.1 mmol/L (ref 3.5–5.1)
Sodium: 140 mmol/L (ref 135–145)

## 2021-07-17 LAB — PROTIME-INR
INR: 1.6 — ABNORMAL HIGH (ref 0.8–1.2)
Prothrombin Time: 18.9 seconds — ABNORMAL HIGH (ref 11.4–15.2)

## 2021-07-17 MED ORDER — BRIVARACETAM 25 MG PO TABS
100.0000 mg | ORAL_TABLET | Freq: Two times a day (BID) | ORAL | Status: DC
Start: 2021-07-17 — End: 2021-07-20
  Administered 2021-07-17 – 2021-07-20 (×6): 100 mg via ORAL
  Filled 2021-07-17 (×6): qty 4

## 2021-07-17 MED ORDER — WARFARIN SODIUM 5 MG PO TABS
10.0000 mg | ORAL_TABLET | Freq: Once | ORAL | Status: AC
  Administered 2021-07-17: 10 mg via ORAL
  Filled 2021-07-17: qty 2

## 2021-07-17 MED ORDER — LEVOFLOXACIN 250 MG PO TABS
250.0000 mg | ORAL_TABLET | Freq: Every day | ORAL | Status: DC
  Administered 2021-07-18 – 2021-07-20 (×3): 250 mg via ORAL
  Filled 2021-07-17 (×3): qty 1

## 2021-07-17 NOTE — Consult Note (Addendum)
[facility] Nurse Consult Note: Reason for Consult: Consult requested for left outer ankle.  Pt has dry brown scabs, most removes easily, revealing a healing chronic Stage 4 pressure injury. 2X1.5X.1cm, 90% pink dry scar tissue, surrounded to wound edges by 10% black dry scabs.  No odor, drainage, or fluctuance.  Bone palpable underneath the scar tissue. Pressure Injury POA: Yes Dressing procedure/placement/frequency: Topical treatment orders provided for bedside nurses to perform as follows: Float heels to reduce pressure. Foam dressing to left outer ankle, change Q 3 days or PRN soiling Please re-consult if further assistance is needed.  Thank-you,  Julien Girt MSN, [place], [place], [place], [place]

## 2021-07-17 NOTE — Progress Notes (Addendum)
PROGRESS NOTE Cassandra Andrade  HQP:[id number] DOB: 05-Aug-1965 DOA: 07/14/2021 PCP: Berkley Harvey, NP   Brief Narrative/Hospital Course: 56 y.o. 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 Per mother patient has been eating and drinking very little, "body hot to touch" , urine smelly, patient became confused and stopped talking altogether for the last 2 days PTA.  In ED- found to have UTI/ Acute metabolic encephalopathy, dehydration. Urine culture with pseudomonas.  Mentation overall significantly better and now eating well.  Subjective: Seen and examined this morning She is alert awake appears to be close to baseline?-Oriented to place but thinks Trump is the president.  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  Mental status significantly better- Continue IV Zosyn for uti. cont fall precaution delirium precaution seizure precaution.  Continue her home Vimpat, Lamictal, bivaracetam.  Patient  mother wants the patient to return to home upon discharge  Urinary tract infection due to Pseudomonas and Enterococcus:Sensitive ampicillin, Levaquin. Wil D/W id to switch abx-if switched to Levaquin will need to monitor INR closely  Malnutrition of moderate degree Chronic dysphagia: Augment nutritional status, continue Megace, continue dysphagia 1 diet as per speech.  Speech consulted-on dysphagia 1 diet, continue PPI.  Continue to augment nutritional status  Hypokalemia Hypomagnesemia: Repleted.  Dehydration:Improved.Encourage oral intake  History of deep venous thrombosis Lupus anticoagulant disorder: Patient stopped Coumadin, on Lovenox bridge-pharmacy dosing for inr goal 2-3, patient now taking Coumadin.INR is still subtherapeutic.   Recent Labs  Lab 07/14/21 2020 07/15/21 0457 07/16/21 0615 07/17/21 0749  INR 9.5* 2.0* 1.5* 1.6*   Chronic pain associated with significant psychosocial dysfunction Depression: Continue pain control, Effexor  Pressure ulcer left pretibial-wound care consulted.  DVT prophylaxis: SQ Lovenox Code Status:   Code Status: Full Code Family Communication: plan of care discussed with patient/ I called and updated her mother, anticipating dc home tomorrow.  Patient status is: Inpatient because of ongoing management of UTI, encephalopathy Level of care: Med-Surg   Dispo: The patient is from: home, lives with daughter            Anticipated disposition: home, anticipate discharge in next day once inr therapeutic   Mobility Assessment (last 72 hours)     Mobility Assessment     Row Name 07/15/21 0900 07/14/21 2048 07/14/21 1800       Does patient have an order for bedrest or is patient medically unstable Yes- Bedfast (Level 1) - Complete Yes- Bedfast (Level 1) - Complete Yes- Bedfast (Level 1) - Complete     What is the highest level of mobility based on the progressive mobility  assessment? Level 1 (Bedfast) - Unable to balance while sitting on  edge of bed Level 1 (Bedfast) - Unable to balance while sitting on edge of bed Level 1 (Bedfast) - Unable to balance while sitting on edge of bed     Is the above level different from baseline mobility prior to current illness? Yes - Recommend PT order -- Yes - Recommend PT order            Objective: Vitals last 24 hrs: Vitals:   07/16/21 1934 07/16/21 2335 07/17/21 0351 07/17/21 0739  BP: 103/64 94/64 105/61 114/70  Pulse: 87 87 (!) 104 98  Resp: '16 20 17 14  '$ Temp: 98.2 F (36.8 C) 98.2 F (36.8 C) 99.5 F (37.5 C) 97.9 F (36.6 C)  TempSrc: Oral Axillary Oral Oral  SpO2: 100% 100% 97% 100%  Weight:   64.9 kg   Height:       Weight change: 0.1 kg  Physical Examination: General exam: Aaox2-3, ill looking, older than stated age, weak appearing. HEENT:Oral mucosa moist, Ear/Nose WNL grossly, dentition normal. Respiratory system: bilaterally diminished, no use of accessory muscle Cardiovascular system: S1 & S2 +, No JVD,. Gastrointestinal system: Abdomen soft,NT,ND,BS+ Nervous System:Alert, awake, moving extremities and grossly nonfocal Extremities: LE ankle edema mild, rt AKA Skin: No rashes,no icterus. MSK: Normal muscle bulk,tone, power  Previous tracheostomy scar  Medications reviewed:  Scheduled Meds:  enoxaparin (LOVENOX) injection  60 mg Subcutaneous Q12H   feeding supplement  237 mL Oral BID BM   lamoTRIgine  350 mg Oral BID   megestrol  400 mg Oral Daily   multivitamin with minerals  1 tablet Oral Daily   pantoprazole (PROTONIX) IV  40 mg Intravenous Q24H   sodium chloride flush  10-40 mL Intracatheter Q12H   venlafaxine XR  150 mg Oral Q breakfast   Warfarin - Pharmacist Dosing Inpatient   Does not apply q1600   Continuous Infusions:  lacosamide (VIMPAT) IV 200 mg (07/17/21 1049)   piperacillin-tazobactam (ZOSYN)  IV 3.375 g (07/17/21 0636)   Diet Order             DIET - DYS 1 Room service appropriate? Yes with Assist; Fluid consistency: Thin  Diet  effective now                    Nutrition Problem: Moderate Malnutrition (in the context of chronic illness) Etiology: poor appetite Signs/Symptoms: mild fat depletion, mild muscle depletion, moderate muscle depletion Interventions: Refer to RD note for recommendations   Intake/Output Summary (Last 24 hours) at 07/17/2021 1101 Last data filed at 07/17/2021 0700 Gross per 24 hour  Intake 1109.78 ml  Output 600 ml  Net 509.78 ml    Net IO Since Admission: 2,631.12 mL [07/17/21 1101]  Wt Readings from Last 3 Encounters:  07/17/21 64.9 kg  06/24/21 76.6 kg  06/13/21 76.6 kg     Unresulted Labs (From admission, onward)     Start     Ordered   07/15/21 0500  Protime-INR  Daily,   R     Question:  Specimen collection method  Answer:  IV Team=IV Team collect   07/14/21 2155          Data Reviewed: I have personally reviewed following labs and imaging studies CBC: Recent Labs  Lab 07/14/21 0954 07/15/21 0457 07/16/21 0615  WBC 5.0 5.2 5.9  NEUTROABS 2.5  --   --   HGB 10.6* 9.4* 9.5*  HCT 36.1 31.0* 30.1*  MCV 92.6 89.3 87.0  PLT 243 237 037    Basic Metabolic Panel: Recent Labs  Lab 07/14/21 0954 07/15/21 0457 07/16/21 0615 07/17/21 0749  NA 139 142 140 140  K 4.3 3.3* 2.8* 4.1  CL 102 105 103 105  CO2 '26 28 29 27  '$ GLUCOSE 89 92 137* 96  BUN '12 10 6 6  '$ CREATININE 1.00 0.77 0.70 0.66  CALCIUM 9.6 9.0 9.0 9.1  MG  --   --  1.5*  --     GFR: Estimated Creatinine Clearance: 75.3 mL/min (by C-G formula based on SCr of 0.66 mg/dL). Liver Function Tests: Recent Labs  Lab 07/14/21 0954 07/16/21 0615  AST 22 24  ALT 16 18  ALKPHOS 92 87  BILITOT 0.4 0.4  PROT 6.8 6.4*  ALBUMIN 2.8* 2.4*    No results for input(s): LIPASE, AMYLASE in the last 168 hours. No results for input(s): AMMONIA in the last 168 hours. Coagulation Profile: Recent Labs  Lab 07/14/21 2020 07/15/21 0457 07/16/21 0615 07/17/21 0749  INR 9.5* 2.0* 1.5* 1.6*    BNP  (last 3 results) No results for input(s): PROBNP in the last 8760 hours. HbA1C: No results for input(s): HGBA1C in the last 72 hours. CBG: No results for input(s): GLUCAP in the last 168 hours. Lipid Profile: No results for input(s): CHOL, HDL, LDLCALC, TRIG, CHOLHDL, LDLDIRECT in the last 72 hours. Thyroid Function Tests: No results for input(s): TSH, T4TOTAL, FREET4, T3FREE, THYROIDAB in the last 72 hours. Sepsis Labs: No results for input(s): PROCALCITON, LATICACIDVEN in the last 168 hours.  Recent Results (from the past 240 hour(s))  Urine Culture     Status: Abnormal   Collection Time: 07/14/21 11:17 AM   Specimen: In/Out Cath Urine  Result Value Ref Range Status   Specimen Description IN/OUT CATH URINE  Final   Special Requests   Final    NONE Performed at [facility], [address] [place], [place] [address]    Culture (A)  Final    >=100,000 COLONIES/mL PSEUDOMONAS AERUGINOSA 60,000 COLONIES/mL ENTEROCOCCUS FAECIUM    Report Status 07/17/2021 FINAL  Final   Organism ID, Bacteria PSEUDOMONAS AERUGINOSA (A)  Final   Organism ID, Bacteria ENTEROCOCCUS FAECIUM (A)  Final      Susceptibility   Enterococcus faecium - MIC*    AMPICILLIN <=2 SENSITIVE Sensitive     NITROFURANTOIN 64 INTERMEDIATE Intermediate     VANCOMYCIN <=0.5 SENSITIVE Sensitive     * 60,000 COLONIES/mL ENTEROCOCCUS FAECIUM   Pseudomonas aeruginosa - MIC*    CEFTAZIDIME 4 SENSITIVE Sensitive     CIPROFLOXACIN <=0.25 SENSITIVE Sensitive     GENTAMICIN <=1 SENSITIVE Sensitive     IMIPENEM 2 SENSITIVE Sensitive     PIP/TAZO 8 SENSITIVE Sensitive     CEFEPIME 2 SENSITIVE Sensitive     * >=100,000 COLONIES/mL PSEUDOMONAS AERUGINOSA     Antimicrobials: Anti-infectives (From admission, onward)    Start     Dose/Rate Route Frequency Ordered Stop   07/15/21 1400  piperacillin-tazobactam (ZOSYN) IVPB 3.375 g        3.375 g 12.5 mL/hr over 240 Minutes Intravenous Every 8 hours 07/15/21 1241      07/15/21 1000  cefTRIAXone (ROCEPHIN) 1 g in sodium chloride 0.9 % 100 mL IVPB  Status:  Discontinued        1 g 200 mL/hr over 30 Minutes Intravenous Every 24 hours 07/14/21 1352 07/15/21 1241   07/14/21 1300  cefTRIAXone (ROCEPHIN) 2 g  in sodium chloride 0.9 % 100 mL IVPB        2 g 200 mL/hr over 30 Minutes Intravenous  Once 07/14/21 1259 07/14/21 1456      Culture/Microbiology    Component Value Date/Time   SDES IN/OUT CATH URINE 07/14/2021 1117   SPECREQUEST  07/14/2021 1117    NONE Performed at [facility] [address] [place], [place] [address]    CULT (A) 07/14/2021 1117    >=100,000 COLONIES/mL PSEUDOMONAS AERUGINOSA 60,000 COLONIES/mL ENTEROCOCCUS FAECIUM    REPTSTATUS 07/17/2021 FINAL 07/14/2021 1117  Radiology Studies: No results found.   LOS: 2 days   Antonieta Pert, MD [facility]  07/17/2021, 11:01 AM

## 2021-07-17 NOTE — Progress Notes (Addendum)
Calorie Count Note  48-hour calorie count ordered.  No tickets present for 2/25 this AM but 2/26 was present to calculate intake. Intake of breakfast this AM quite good. Pt has the ability to meet nutrition needs, but is likely not meeting them on days when mental status is not at baseline based on intake from 5/24 and last admission.  Diet: DYS 1 with thin liquids Supplements: Ensure Enlive BID, MC TID  Breakfast 5/25: no meal ticket Lunch 5/25: no meal ticket Dinner 5/25: no meal ticket Supplements: none recorded  Breakfast 5/26: 678kcal and 40g of protein  NUTRITION DIAGNOSIS:  Moderate Malnutrition (in the context of chronic illness) related to poor appetite as evidenced by mild fat depletion, mild muscle depletion, moderate muscle depletion.  GOAL:  Patient will meet greater than or equal to 90% of their needs  INTERVENTION:  Continue current diet as ordered Ordered feeding assist Magic cup TID with meals, each supplement provides 290 kcal and 9 grams of protein Ensure Enlive po BID, each supplement provides 350 kcal and 20 grams of protein. Monitor GOC  Ranell Patrick, RD, LDN Clinical Dietitian RD pager # available in [facility]  After hours/weekend pager # available in [facility]

## 2021-07-17 NOTE — Progress Notes (Signed)
ANTICOAGULATION CONSULT NOTE - Follow Up Consult  Pharmacy Consult for Lovenox to warfarin Indication: History of DVT, lupus anticoagulant disorder  Allergies  Allergen Reactions   Vicodin [Hydrocodone-Acetaminophen] Nausea And Vomiting   Morphine Rash   Morphine And Related Rash    Allergic reaction only in iv form    Patient Measurements: Height: '5\' 6"'$  (167.6 cm) Weight: 64.9 kg (143 lb 1.3 oz) IBW/kg (Calculated) : 59.3  Vital Signs: Temp: 99.4 F (37.4 C) (05/26 1513) Temp Source: Oral (05/26 1513) BP: 100/64 (05/26 1513) Pulse Rate: 98 (05/26 1513)  Labs: Recent Labs    07/14/21 2020 07/15/21 0457 07/16/21 0615 07/17/21 0749  HGB  --  9.4* 9.5*  --   HCT  --  31.0* 30.1*  --   PLT  --  237 260  --   LABPROT 76.1* 22.9* 17.6* 18.9*  INR 9.5* 2.0* 1.5* 1.6*  CREATININE  --  0.77 0.70 0.66  CKTOTAL 140  --   --   --      Estimated Creatinine Clearance: 75.3 mL/min (by C-G formula based on SCr of 0.66 mg/dL).  Assessment: 56 y.o. female with medical history significant of brain aneurysm rupture and intracranial hemorrhage s/p ventricular shunt, seizure disorder, DVT and lupus anticoagulant on chronic warfarin, s/p BKA, chronic dysphagia on pured diet, functional quadriplegia and bedbound, sent by family for acute mentation changes x2 days. Pharmacy consulted for resumption of home warfarin, now with Lovenox bridge.   PTA warfarin as of 06/16/21: '10mg'$  daily except 7.'5mg'$  Sun and Wed INR on admit 9.5 - received Vitamin K History of elevated INRs on recent admissions  INR reversed on 5/23 --> 1.6 today - started Lovenox 5/24. No bleeding noted, CBC stable.  Of note: Levaquin ordered to start tomorrow x 4 days. Likely require reduced doses.   Goal of Therapy:  INR 2-3 Monitor platelets by anticoagulation protocol: Yes   Plan:  Continue Lovenox 60 mg SQ q12h Warfarin 10 mg PO tonight  Discontinue Lovenox when INR >2 x 24h Monitor daily INR, CBC, clinical  course, s/sx of bleed, PO intake/diet, Drug-Drug Interactions   Thank you for allowing [place] to participate in this patients care. Jens Som, PharmD 07/17/2021 4:00 PM  **Pharmacist phone directory can be found on [email or web address] listed under [facility]**

## 2021-07-18 DIAGNOSIS — E860 Dehydration: Secondary | ICD-10-CM

## 2021-07-18 DIAGNOSIS — E876 Hypokalemia: Secondary | ICD-10-CM

## 2021-07-18 DIAGNOSIS — N390 Urinary tract infection, site not specified: Secondary | ICD-10-CM

## 2021-07-18 DIAGNOSIS — Z89511 Acquired absence of right leg below knee: Secondary | ICD-10-CM

## 2021-07-18 DIAGNOSIS — F3341 Major depressive disorder, recurrent, in partial remission: Secondary | ICD-10-CM

## 2021-07-18 DIAGNOSIS — G9341 Metabolic encephalopathy: Secondary | ICD-10-CM | POA: Diagnosis not present

## 2021-07-18 DIAGNOSIS — Z86718 Personal history of other venous thrombosis and embolism: Secondary | ICD-10-CM | POA: Diagnosis not present

## 2021-07-18 DIAGNOSIS — G894 Chronic pain syndrome: Secondary | ICD-10-CM

## 2021-07-18 DIAGNOSIS — D6862 Lupus anticoagulant syndrome: Secondary | ICD-10-CM

## 2021-07-18 DIAGNOSIS — B965 Pseudomonas (aeruginosa) (mallei) (pseudomallei) as the cause of diseases classified elsewhere: Secondary | ICD-10-CM

## 2021-07-18 DIAGNOSIS — G40909 Epilepsy, unspecified, not intractable, without status epilepticus: Secondary | ICD-10-CM

## 2021-07-18 DIAGNOSIS — E440 Moderate protein-calorie malnutrition: Secondary | ICD-10-CM

## 2021-07-18 LAB — PROTIME-INR
INR: 2.2 — ABNORMAL HIGH (ref 0.8–1.2)
Prothrombin Time: 24.4 seconds — ABNORMAL HIGH (ref 11.4–15.2)

## 2021-07-18 MED ORDER — WARFARIN SODIUM 7.5 MG PO TABS
7.5000 mg | ORAL_TABLET | Freq: Once | ORAL | Status: AC
  Administered 2021-07-18: 7.5 mg via ORAL
  Filled 2021-07-18: qty 1

## 2021-07-18 MED ORDER — ORAL CARE MOUTH RINSE
15.0000 mL | Freq: Two times a day (BID) | OROMUCOSAL | Status: DC
  Administered 2021-07-18 – 2021-07-20 (×4): 15 mL via OROMUCOSAL

## 2021-07-18 NOTE — Progress Notes (Signed)
Pt with very poor po intake today including meds, meals, and supplements. Yesterday was much more interested in eating and participating in assessments. She appears to engage with her son visiting at bedside, but is not interested in any oral intake.

## 2021-07-18 NOTE — Progress Notes (Signed)
ANTICOAGULATION CONSULT NOTE - Follow Up Consult  Pharmacy Consult for Lovenox to warfarin Indication: History of DVT, lupus anticoagulant disorder  Allergies  Allergen Reactions   Vicodin [Hydrocodone-Acetaminophen] Nausea And Vomiting   Morphine Rash   Morphine And Related Rash    Allergic reaction only in iv form    Patient Measurements: Height: '5\' 6"'$  (167.6 cm) Weight: 64.9 kg (143 lb 1.3 oz) IBW/kg (Calculated) : 59.3  Vital Signs: Temp: 98.3 F (36.8 C) (05/27 0428) Temp Source: Oral (05/27 0428) BP: 107/62 (05/27 0428) Pulse Rate: 84 (05/27 0428)  Labs: Recent Labs    07/16/21 0615 07/17/21 0749 07/18/21 0420  HGB 9.5*  --   --   HCT 30.1*  --   --   PLT 260  --   --   LABPROT 17.6* 18.9* 24.4*  INR 1.5* 1.6* 2.2*  CREATININE 0.70 0.66  --      Estimated Creatinine Clearance: 75.3 mL/min (by C-G formula based on SCr of 0.66 mg/dL).  Assessment: 56 y.o. female with medical history significant of brain aneurysm rupture and intracranial hemorrhage s/p ventricular shunt, seizure disorder, DVT and lupus anticoagulant on chronic warfarin, s/p BKA, chronic dysphagia on pured diet, functional quadriplegia and bedbound, sent by family for acute mentation changes x2 days. Pharmacy consulted for resumption of home warfarin, now with Lovenox bridge.   PTA warfarin as of 06/16/21: '10mg'$  daily except 7.'5mg'$  Sun and Wed INR on admit 9.5 - received Vitamin K History of elevated INRs on recent admissions  INR reversed on 5/23 --> 2.2 today - started Lovenox 5/24. No bleeding noted, CBC stable.  Of note: Levaquin ordered to start tomorrow x 4 days. Likely require reduced doses.   Goal of Therapy:  INR 2-3 Monitor platelets by anticoagulation protocol: Yes   Plan:  Continue Lovenox 60 mg SQ q12h Warfarin 7.5 mg PO tonight- reduced from home dose while on levaquin Discontinue Lovenox when INR >2 x 24h- can DC tomorrow if at goal Monitor daily INR, CBC, clinical course,  s/sx of bleed, PO intake/diet, Drug-Drug Interactions   Lestine Box, PharmD PGY2 Infectious Diseases Pharmacy Resident   Please check [email or web address] for unit-specific pharmacy phone numbers

## 2021-07-18 NOTE — Progress Notes (Signed)
Progress Note  Patient: Cassandra Andrade TMA:[id number] DOB: 03/10/1965  DOA: 07/14/2021  DOS: 07/18/2021    Brief hospital course: 56 y.o. 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 Per mother patient has been eating and drinking very little, "body hot to touch" , urine smelly, patient became confused and stopped talking altogether for the last 2 days PTA.  In ED- found to have UTI/ Acute metabolic encephalopathy, dehydration. Urine culture with pseudomonas.  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  - Overall improving. Will continue abx as below.  - Continue home AEDs - Delirium precautions    Urinary tract infection due to Pseudomonas and Enterococcus: - Switched to levaquin per discussion with ID. Continue through last dose 5/30.     Moderate protein calorie malnutrition:  Chronic dysphagia: Augment nutritional status, continue Megace, continue dysphagia 1 diet as per speech.  Speech consulted-on dysphagia 1 diet, continue PPI.  Continue to augment nutritional status   Hypokalemia Hypomagnesemia: Repleted.   Dehydration:Improved. Encourage oral intake   History of deep venous thrombosis Lupus anticoagulant disorder: - Continue lovenox bridging with reinitiation of coumadin. Goal INR 2-3, if INR >2 in AM, can DC lovenox and continue with coumadin.  - Will need INR checks frequently going forward in light of fluoroquinolone.  Supratherapeutic INR: On arrival  s/p vitamin K.   Chronic pain associated with significant psychosocial dysfunction, depression: - Continue pain control, effexor   Stage 4 left pretibial pressure injury, POA: Does not appear infected.  - Offload and local wound care.   Subjective: Interactive but minimally verbal for me early this morning. She's eaten some breakfast with assistance. No acute overnight events reported. No bleeding.  Objective: Vitals:   07/17/21 2016 07/17/21 2357 07/18/21 0428 07/18/21 0746  BP: (!) 104/57 (!) 101/58 107/62 104/66  Pulse: (!) 109  84 82  Resp: '18 16 16 18  '$ Temp: 99.6 F (37.6 C) 98.9 F (37.2 C) 98.3 F (36.8 C) 98.3 F (36.8 C)  TempSrc: Oral Axillary Oral   SpO2: 99% 100% 100% 100%  Weight:      Height:       Gen: Chronically ill-appearing 56 y.o. female in no distress Pulm: Nonlabored breathing room air. Diminished anterolaterally CV: Regular rate and rhythm. No murmur, rub, or gallop. No JVD, no pitting dependent edema. GI: Abdomen soft, non-tender, non-distended, with normoactive bowel sounds.  Ext: Warm, contractures of UEs and LLE, amputation of RLE deformities Skin: No new rashes, lesions or ulcers on visualized skin. Sacrum not visualized. Left ankle dressing is c/d/i Neuro: Rousable, tracks, follows some commands. Not cooperative with full exam. Psych: Calm but otherwise UTD  Data Personally reviewed: CBC: Recent Labs  Lab 07/14/21 0954 07/15/21 0457 07/16/21 0615  WBC 5.0 5.2 5.9  NEUTROABS 2.5  --   --   HGB 10.6* 9.4* 9.5*  HCT 36.1 31.0* 30.1*  MCV 92.6 89.3 87.0  PLT 243 237 256   Basic Metabolic Panel: Recent Labs  Lab 07/14/21 0954 07/15/21 0457 07/16/21 0615 07/17/21 0749  NA 139 142 140 140  K 4.3  3.3* 2.8* 4.1  CL 102 105 103 105  CO2 '26 28 29 27  '$ GLUCOSE 89 92 137* 96  BUN '12 10 6 6  '$ CREATININE 1.00 0.77 0.70 0.66  CALCIUM 9.6 9.0 9.0 9.1  MG  --   --  1.5*  --    GFR: Estimated Creatinine Clearance: 75.3 mL/min (by C-G formula  based on SCr of 0.66 mg/dL). Liver Function Tests: Recent Labs  Lab 07/14/21 0954 07/16/21 0615  AST 22 24  ALT 16 18  ALKPHOS 92 87  BILITOT 0.4 0.4  PROT 6.8 6.4*  ALBUMIN 2.8* 2.4*   No results for input(s): LIPASE, AMYLASE in the last 168 hours. No results for input(s): AMMONIA in the last 168 hours. Coagulation Profile: Recent Labs  Lab 07/14/21 2020 07/15/21 0457 07/16/21 0615 07/17/21 0749 07/18/21 0420  INR 9.5* 2.0* 1.5* 1.6* 2.2*   Cardiac Enzymes: Recent Labs  Lab 07/14/21 2020  CKTOTAL 140   BNP (last 3 results) No results for input(s): PROBNP in the last 8760 hours. HbA1C: No results for input(s): HGBA1C in the last 72 hours. CBG: No results for input(s): GLUCAP in the last 168 hours. Lipid Profile: No results for input(s): CHOL, HDL, LDLCALC, TRIG, CHOLHDL, LDLDIRECT in the last 72 hours. Thyroid Function Tests: No results for input(s): TSH, T4TOTAL, FREET4, T3FREE, THYROIDAB in the last 72 hours. Anemia Panel: No results for input(s): VITAMINB12, FOLATE, FERRITIN, TIBC, IRON, RETICCTPCT in the last 72 hours. Urine analysis:    Component Value Date/Time   COLORURINE AMBER (A) 07/14/2021 1117   APPEARANCEUR TURBID (A) 07/14/2021 1117   LABSPEC  07/14/2021 1117    TEST NOT REPORTED DUE TO COLOR INTERFERENCE OF URINE PIGMENT   PHURINE  07/14/2021 1117    TEST NOT REPORTED DUE TO COLOR INTERFERENCE OF URINE PIGMENT   GLUCOSEU (A) 07/14/2021 1117    TEST NOT REPORTED DUE TO COLOR INTERFERENCE OF URINE PIGMENT   HGBUR (A) 07/14/2021 1117    TEST NOT REPORTED DUE TO COLOR INTERFERENCE OF URINE PIGMENT   BILIRUBINUR (A) 07/14/2021 1117    TEST NOT REPORTED DUE TO COLOR INTERFERENCE OF URINE PIGMENT   KETONESUR (A) 07/14/2021 1117    TEST NOT REPORTED DUE TO COLOR INTERFERENCE OF URINE PIGMENT   PROTEINUR (A) 07/14/2021 1117    TEST NOT REPORTED DUE TO COLOR INTERFERENCE OF URINE PIGMENT   UROBILINOGEN 0.2 05/05/2013 1110   NITRITE (A) 07/14/2021  1117    TEST NOT REPORTED DUE TO COLOR INTERFERENCE OF URINE PIGMENT   LEUKOCYTESUR (A) 07/14/2021 1117    TEST NOT REPORTED DUE TO COLOR INTERFERENCE OF URINE PIGMENT   Recent Results (from the past 240 hour(s))  Urine Culture     Status: Abnormal (Preliminary result)   Collection Time: 07/14/21 11:17 AM   Specimen: In/Out Cath Urine  Result Value Ref Range Status   Specimen Description IN/OUT CATH URINE  Final   Special Requests   Final    NONE Performed at [facility], [place] [address], [place], [place] [address]    Culture (A)  Final    >=100,000 COLONIES/mL PSEUDOMONAS AERUGINOSA 60,000 COLONIES/mL ENTEROCOCCUS FAECIUM    Report Status PENDING  Incomplete   Organism ID, Bacteria PSEUDOMONAS AERUGINOSA (A)  Final   Organism ID, Bacteria ENTEROCOCCUS FAECIUM (A)  Final      Susceptibility   Enterococcus faecium - MIC*    AMPICILLIN <=2 SENSITIVE Sensitive     LEVOFLOXACIN 2 SENSITIVE Sensitive  NITROFURANTOIN 64 INTERMEDIATE Intermediate     VANCOMYCIN <=0.5 SENSITIVE Sensitive     * 60,000 COLONIES/mL ENTEROCOCCUS FAECIUM   Pseudomonas aeruginosa - MIC*    CEFTAZIDIME 4 SENSITIVE Sensitive     CIPROFLOXACIN <=0.25 SENSITIVE Sensitive     GENTAMICIN <=1 SENSITIVE Sensitive     IMIPENEM 2 SENSITIVE Sensitive     PIP/TAZO 8 SENSITIVE Sensitive     CEFEPIME 2 SENSITIVE Sensitive     * >=100,000 COLONIES/mL PSEUDOMONAS AERUGINOSA     Family Communication: Mother by phone this morning  Disposition: Status is: Inpatient Remains inpatient appropriate because: Given initiation of levaquin, history of labile and supratherapeutic INRs and limited ability to monitor INR at home, will recheck INR in AM and if >2 can discharge without further lovenox bridging.  Planned Discharge Destination: Home with [place], MD 07/18/2021 10:41 AM Page by [email or web address]

## 2021-07-19 ENCOUNTER — Inpatient Hospital Stay (HOSPITAL_COMMUNITY): Payer: Medicare Other

## 2021-07-19 DIAGNOSIS — E860 Dehydration: Secondary | ICD-10-CM | POA: Diagnosis not present

## 2021-07-19 DIAGNOSIS — G9341 Metabolic encephalopathy: Secondary | ICD-10-CM | POA: Diagnosis not present

## 2021-07-19 DIAGNOSIS — G894 Chronic pain syndrome: Secondary | ICD-10-CM | POA: Diagnosis not present

## 2021-07-19 DIAGNOSIS — Z86718 Personal history of other venous thrombosis and embolism: Secondary | ICD-10-CM | POA: Diagnosis not present

## 2021-07-19 LAB — COMPREHENSIVE METABOLIC PANEL
ALT: 86 U/L — ABNORMAL HIGH (ref 0–44)
AST: 170 U/L — ABNORMAL HIGH (ref 15–41)
Albumin: 2.5 g/dL — ABNORMAL LOW (ref 3.5–5.0)
Alkaline Phosphatase: 93 U/L (ref 38–126)
Anion gap: 10 (ref 5–15)
BUN: 6 mg/dL (ref 6–20)
CO2: 26 mmol/L (ref 22–32)
Calcium: 9.5 mg/dL (ref 8.9–10.3)
Chloride: 105 mmol/L (ref 98–111)
Creatinine, Ser: 0.66 mg/dL (ref 0.44–1.00)
GFR, Estimated: >60 mL/min (ref >60)
Glucose, Bld: 118 mg/dL — ABNORMAL HIGH (ref 70–99)
Potassium: 3.6 mmol/L (ref 3.5–5.1)
Sodium: 141 mmol/L (ref 135–145)
Total Bilirubin: 0.2 mg/dL — ABNORMAL LOW (ref 0.3–1.2)
Total Protein: 6.4 g/dL — ABNORMAL LOW (ref 6.5–8.1)

## 2021-07-19 LAB — PROTIME-INR
INR: 2.1 — ABNORMAL HIGH (ref 0.8–1.2)
Prothrombin Time: 23.4 seconds — ABNORMAL HIGH (ref 11.4–15.2)

## 2021-07-19 LAB — CBC WITH DIFFERENTIAL/PLATELET
Abs Immature Granulocytes: 0.03 10*3/uL (ref 0.00–0.07)
Basophils Absolute: 0 10*3/uL (ref 0.0–0.1)
Basophils Relative: 0 %
Eosinophils Absolute: 0.1 10*3/uL (ref 0.0–0.5)
Eosinophils Relative: 2 %
HCT: 28.1 % — ABNORMAL LOW (ref 36.0–46.0)
Hemoglobin: 9 g/dL — ABNORMAL LOW (ref 12.0–15.0)
Immature Granulocytes: 1 %
Lymphocytes Relative: 46 %
Lymphs Abs: 2.1 10*3/uL (ref 0.7–4.0)
MCH: 28 pg (ref 26.0–34.0)
MCHC: 32 g/dL (ref 30.0–36.0)
MCV: 87.3 fL (ref 80.0–100.0)
Monocytes Absolute: 0.4 10*3/uL (ref 0.1–1.0)
Monocytes Relative: 8 %
Neutro Abs: 2 10*3/uL (ref 1.7–7.7)
Neutrophils Relative %: 43 %
Platelets: 249 10*3/uL (ref 150–400)
RBC: 3.22 MIL/uL — ABNORMAL LOW (ref 3.87–5.11)
RDW: 14 % (ref 11.5–15.5)
WBC: 4.7 10*3/uL (ref 4.0–10.5)
nRBC: 0 % (ref 0.0–0.2)

## 2021-07-19 LAB — GLUCOSE, CAPILLARY: Glucose-Capillary: 77 mg/dL (ref 70–99)

## 2021-07-19 LAB — MAGNESIUM: Magnesium: 1.5 mg/dL — ABNORMAL LOW (ref 1.7–2.4)

## 2021-07-19 LAB — PHOSPHORUS: Phosphorus: 2.2 mg/dL — ABNORMAL LOW (ref 2.5–4.6)

## 2021-07-19 LAB — LACTIC ACID, PLASMA: Lactic Acid, Venous: 1 mmol/L (ref 0.5–1.9)

## 2021-07-19 MED ORDER — MAGNESIUM SULFATE 2 GM/50ML IV SOLN
2.0000 g | Freq: Once | INTRAVENOUS | Status: AC
  Administered 2021-07-19: 2 g via INTRAVENOUS
  Filled 2021-07-19: qty 50

## 2021-07-19 MED ORDER — LORAZEPAM 2 MG/ML IJ SOLN
INTRAMUSCULAR | Status: AC
  Administered 2021-07-19: 2 mg via INTRAVENOUS
  Filled 2021-07-19: qty 1

## 2021-07-19 MED ORDER — POTASSIUM PHOSPHATES 15 MMOLE/5ML IV SOLN
15.0000 mmol | Freq: Once | INTRAVENOUS | Status: AC
  Administered 2021-07-19: 15 mmol via INTRAVENOUS
  Filled 2021-07-19: qty 5

## 2021-07-19 MED ORDER — CLONAZEPAM 0.5 MG PO TABS
1.0000 mg | ORAL_TABLET | Freq: Two times a day (BID) | ORAL | Status: DC
  Administered 2021-07-19 – 2021-07-20 (×3): 1 mg via ORAL
  Filled 2021-07-19 (×3): qty 2

## 2021-07-19 MED ORDER — LORAZEPAM 2 MG/ML IJ SOLN: 1.0000 mg | Freq: Once | INTRAMUSCULAR | Status: DC

## 2021-07-19 MED ORDER — DEXTROSE 50 % IV SOLN
INTRAVENOUS | Status: AC
  Administered 2021-07-19: 12.5 g via INTRAVENOUS
  Filled 2021-07-19: qty 50

## 2021-07-19 MED ORDER — WARFARIN SODIUM 7.5 MG PO TABS
7.5000 mg | ORAL_TABLET | Freq: Once | ORAL | Status: AC
  Administered 2021-07-19: 7.5 mg via ORAL
  Filled 2021-07-19: qty 1

## 2021-07-19 MED ORDER — LORAZEPAM 2 MG/ML IJ SOLN: 2.0000 mg | Freq: Once | INTRAMUSCULAR | Status: AC

## 2021-07-19 MED ORDER — POTASSIUM PHOSPHATES 15 MMOLE/5ML IV SOLN
30.0000 mmol | Freq: Once | INTRAVENOUS | Status: DC
  Filled 2021-07-19: qty 10

## 2021-07-19 MED ORDER — DEXTROSE 50 % IV SOLN: 12.5000 g | Freq: Once | INTRAVENOUS | Status: AC

## 2021-07-19 MED ORDER — CLOBAZAM 10 MG PO TABS: 10.0000 mg | ORAL_TABLET | Freq: Every day | ORAL | Status: DC

## 2021-07-19 NOTE — Progress Notes (Signed)
Progress Note  Patient: Cassandra Andrade VHQ:[id number] DOB: 1965-05-17  DOA: 07/14/2021  DOS: 07/19/2021    Brief hospital course: 56 y.o. 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 Per mother patient has been eating and drinking very little, "body hot to touch" , urine smelly, patient became confused and stopped talking altogether for the last 2 days PTA.  In ED- found to have UTI/ Acute metabolic encephalopathy, dehydration. Urine culture with pseudomonas.  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  - Overall improving. Will continue abx as below.  - Continue home AEDs. Breakthrough seizure 5/27, scheduled home benzodiazepine per neurology recommendations. ?if AED levels also affected with levaquin. Checking lamictal trough in AM. - Unequal pupils, no acute changes on CT head repeated 5/28.  - Delirium precautions    Urinary tract infection due to Pseudomonas and Enterococcus: - Switched to levaquin per discussion with ID. Continue through last dose 5/30.     Moderate protein calorie malnutrition:  Chronic dysphagia: Augment nutritional status, continue Megace, continue dysphagia 1 diet as per speech.  Speech consulted-on dysphagia 1 diet, continue PPI.  Continue to augment nutritional status   Hypokalemia Hypomagnesemia: Repleted.   Dehydration:Improved. Encourage oral intake   History of deep venous thrombosis Lupus anticoagulant disorder: - Continue coumadin  per pharmacy dosing. Lovenox bridging stopped after INR therapeutic x24 hours  - Will need INR checks frequently going forward in light of fluoroquinolone.  Supratherapeutic INR: On arrival s/p vitamin K.   Chronic pain associated with significant psychosocial dysfunction, depression: - Continue pain control, effexor   Stage 4 left pretibial pressure injury, POA: Does not appear infected.  - Offload and local wound care.   Subjective: Again not very interactive with me this morning, had seizure activity overnight and was given IV ativan '1mg'$  x2.   Objective: Vitals:   07/19/21 0425 07/19/21 0734 07/19/21 1153 07/19/21 1637  BP:  (!) 112/48 (!) 91/50 105/61  Pulse:  81 81 (!) 107  Resp:  '20 18 18  '$ Temp:  97.8 F (36.6 C) 97.9 F (36.6 C) 98.7 F (37.1 C)  TempSrc:  Oral Oral Oral  SpO2:  100% 98% 100%  Weight: 63.3 kg     Height:       Gen: Chronically ill-appearing female in no distress Pulm: Nonlabored breathing room air. Clear. CV: Regular rate and rhythm. No murmur, rub, or gallop. No JVD, no pitting dependent edema. GI: Abdomen soft, non-tender, non-distended, with normoactive bowel sounds.  Ext: Warm, RLE amputation, no new deformities Skin: No new rashes, lesions or ulcers on visualized skin. Neuro: Drowsy but rousable, not cooperative with exam. No movement in flaccid extremities.  Psych: UTD  Data Personally reviewed: CBC: Recent Labs  Lab 07/14/21 0954 07/15/21 0457 07/16/21 0615 07/19/21 0148  WBC 5.0 5.2 5.9 4.7  NEUTROABS 2.5  --   --  2.0  HGB 10.6* 9.4* 9.5* 9.0*  HCT 36.1 31.0* 30.1* 28.1*  MCV 92.6 89.3 87.0 87.3  PLT 243 237 260 413   Basic Metabolic Panel: Recent Labs  Lab 07/14/21 0954 07/15/21 0457  07/16/21 0615 07/17/21 0749 07/19/21 0148  NA 139 142 140 140 141  K 4.3 3.3* 2.8* 4.1 3.6  CL 102 105 103 105 105  CO2 '26 28 29 27 26  '$ GLUCOSE 89 92 137* 96 118*  BUN '12 10 6 6 6  '$ CREATININE 1.00 0.77 0.70 0.66 0.66  CALCIUM 9.6 9.0 9.0  9.1 9.5  MG  --   --  1.5*  --  1.5*  PHOS  --   --   --   --  2.2*   GFR: Estimated Creatinine Clearance: 75.3 mL/min (by C-G formula based on SCr of 0.66 mg/dL). Liver Function Tests: Recent Labs  Lab 07/14/21 0954 07/16/21 0615 07/19/21 0148  AST 22 24 170*  ALT 16 18 86*  ALKPHOS 92 87 93  BILITOT 0.4 0.4 0.2*  PROT 6.8 6.4* 6.4*  ALBUMIN 2.8* 2.4* 2.5*   No results for input(s): LIPASE, AMYLASE in the last 168 hours. No results for input(s): AMMONIA in the last 168 hours. Coagulation Profile: Recent Labs  Lab 07/15/21 0457 07/16/21 0615 07/17/21 0749 07/18/21 0420 07/19/21 0148  INR 2.0* 1.5* 1.6* 2.2* 2.1*   Cardiac Enzymes: Recent Labs  Lab 07/14/21 2020  CKTOTAL 140   BNP (last 3 results) No results for input(s): PROBNP in the last 8760 hours. HbA1C: No results for input(s): HGBA1C in the last 72 hours. CBG: Recent Labs  Lab 07/19/21 0026  GLUCAP 77   Lipid Profile: No results for input(s): CHOL, HDL, LDLCALC, TRIG, CHOLHDL, LDLDIRECT in the last 72 hours. Thyroid Function Tests: No results for input(s): TSH, T4TOTAL, FREET4, T3FREE, THYROIDAB in the last 72 hours. Anemia Panel: No results for input(s): VITAMINB12, FOLATE, FERRITIN, TIBC, IRON, RETICCTPCT in the last 72 hours. Urine analysis:    Component Value Date/Time   COLORURINE AMBER (A) 07/14/2021 1117   APPEARANCEUR TURBID (A) 07/14/2021 1117   LABSPEC  07/14/2021 1117    TEST NOT REPORTED DUE TO COLOR INTERFERENCE OF URINE PIGMENT   PHURINE  07/14/2021 1117    TEST NOT REPORTED DUE TO COLOR INTERFERENCE OF URINE PIGMENT   GLUCOSEU (A) 07/14/2021 1117    TEST NOT REPORTED DUE TO COLOR INTERFERENCE OF URINE PIGMENT   HGBUR (A) 07/14/2021 1117    TEST NOT REPORTED DUE TO COLOR INTERFERENCE OF URINE PIGMENT   BILIRUBINUR (A) 07/14/2021 1117    TEST NOT REPORTED DUE TO COLOR INTERFERENCE OF URINE PIGMENT   KETONESUR (A) 07/14/2021 1117    TEST NOT REPORTED DUE TO COLOR INTERFERENCE OF  URINE PIGMENT   PROTEINUR (A) 07/14/2021 1117    TEST NOT REPORTED DUE TO COLOR INTERFERENCE OF URINE PIGMENT   UROBILINOGEN 0.2 05/05/2013 1110   NITRITE (A) 07/14/2021 1117    TEST NOT REPORTED DUE TO COLOR INTERFERENCE OF URINE PIGMENT   LEUKOCYTESUR (A) 07/14/2021 1117    TEST NOT REPORTED DUE TO COLOR INTERFERENCE OF URINE PIGMENT   Recent Results (from the past 240 hour(s))  Urine Culture     Status: Abnormal (Preliminary result)   Collection Time: 07/14/21 11:17 AM   Specimen: In/Out Cath Urine  Result Value Ref Range Status   Specimen Description IN/OUT CATH URINE  Final   Special Requests   Final    NONE Performed at [facility], [place] [address] [place], [place] [address]    Culture (A)  Final    >=100,000 COLONIES/mL PSEUDOMONAS AERUGINOSA 60,000 COLONIES/mL ENTEROCOCCUS FAECIUM    Report Status PENDING  Incomplete  Organism ID, Bacteria PSEUDOMONAS AERUGINOSA (A)  Final   Organism ID, Bacteria ENTEROCOCCUS FAECIUM (A)  Final      Susceptibility   Enterococcus faecium - MIC*    AMPICILLIN <=2 SENSITIVE Sensitive     LEVOFLOXACIN 2 SENSITIVE Sensitive     NITROFURANTOIN 64 INTERMEDIATE Intermediate     VANCOMYCIN <=0.5 SENSITIVE Sensitive     * 60,000 COLONIES/mL ENTEROCOCCUS FAECIUM   Pseudomonas aeruginosa - MIC*    CEFTAZIDIME 4 SENSITIVE Sensitive     CIPROFLOXACIN <=0.25 SENSITIVE Sensitive     GENTAMICIN <=1 SENSITIVE Sensitive     IMIPENEM 2 SENSITIVE Sensitive     PIP/TAZO 8 SENSITIVE Sensitive     CEFEPIME 2 SENSITIVE Sensitive     * >=100,000 COLONIES/mL PSEUDOMONAS AERUGINOSA     Family Communication: Mother by phone  Disposition: Status is: Inpatient Remains inpatient appropriate because: breakthrough seizure. Planned Discharge Destination: Home with Home Health if seizure free and LFTs improving on 5/29.  Patrecia Pour, MD 07/19/2021 5:15 PM Page by [email or web address]

## 2021-07-19 NOTE — Progress Notes (Signed)
This RN spoke with Dr. Curly Shores at bedside regarding pt's new scheduled clonazepam order in light of pt's reduced responsiveness and questionable ability to safely take POs. MD endorsed to hold medication until pt was more awake and that previously administered dose of IV Ativan should provide coverage until pt is able to restart home clonazepam. Will continue to monitor.

## 2021-07-19 NOTE — Significant Event (Signed)
Rapid Response Event Note    Responded to Code Stroke page for unequal pupils. On RRT arrival, Code Stroke was cancelled by Dr. Curly Shores. Bedside RN assisted with transfer of pt to CT and back for STAT head CT. Please call RRT if further assistance needed.    Dillard Essex, RN

## 2021-07-19 NOTE — Significant Event (Signed)
Patient's nurse notified that patient was having seizure-like activity of the right upper and lower extremity.  On arrival at bedside patient is encephalopathic not responding to her name.  Patient's right upper and lower extremity was having clonic activity.  Bilaterally patient's pupils are dilated at around 4 mm.  Sugar was 73.  Vital signs shows blood pressure of 128/81 pulse 110/min respiration 14/min temperature 98 F.  I ordered Ativan 1 mg IV and since there was not much response ordered another 1 mg IV of Ativan and also D50 half ampule.  Patient's seizure-like activity in the right upper and lower extremity subsided and patient was responding on calling her name.  I reviewed patient's medications labs and notes.  Will discuss with on-call neurologist Dr. Curly Shores.  Dr. Hal Hope

## 2021-07-19 NOTE — Consult Note (Addendum)
Neurology Consultation Reason for Consult: breakthrough seizure Requesting Physician: Gean Birchwood    CC: Breakthrough seizure  History is obtained from: Chart review and medical team  HPI: Cassandra Andrade is a 56 y.o. 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  She had altered mental status at home (reduced oral intake, and not talking for 2 days prior to admission), and there was concern for UTI.  She has been treated and per notes mental status appears to have been improving from admission, bedside nurse notes that at the beginning of his shift she was able to answer his questions, oriented to self and place, following commands in all 4 extremities though perhaps slightly weaker on the left side.  At approximately 12:10 PM he noticed that she was tachycardic on the monitor with excessive amount of artifact and went to check on her and found that she was seizing.  On further review of telemetry the tachycardia started at around 2340.  She was given Ativan at 00 35 with resolution of seizure activity.  Mental status has been slowly improving since then although she remains nonverbal and not following commands, she is more reactive to stimulation, and opening her eyes  Follows with Dr. April Manson at [facility], Dr. April Manson  Home antiseizure medications: Clonazepam 1 mg twice daily as needed for seizure (on review with mother she is taking this scheduled, not as needed)  Lamotrigine 350 mg twice daily Vimpat 200 mg twice daily Briviact 100 mg BID  Recently she has been on Onfi 10 mg twice daily but appears this was discontinued due to side effects (hallucinations)   On prior neurological evaluations, noted to be oriented to place and person but not time, able to follow commands, intact cranial nerves, and 5 out of 5 strength in her extremities (noting AKA on the right  leg)  Premorbid modified rankin scale:      5 - Severe disability. Requires constant nursing care and attention, bedridden, incontinent.  ROS: Unable to obtain due to altered mental status.   Past Medical History:  Diagnosis Date   Anxiety    Arthritis    Cancer ([place])    Osteosarcoma, right leg   CVA (cerebrovascular accident due to intracerebral hemorrhage) ([place]) 2004   Depression    DVT (deep venous thrombosis) ([facility])    Left leg   MRSA (methicillin resistant Staphylococcus aureus)    S/P BKA (below knee amputation) unilateral ([place])    Right    Seizures ([place])    Thyroid disease    Unspecified epilepsy with intractable epilepsy 02/28/2013   Past Surgical History:  Procedure Laterality Date   ABOVE KNEE LEG AMPUTATION Right    Osteosarcoma   BRAIN SURGERY  2004   cerebral aneurysm   FRACTURE SURGERY     Rt. femur, folllowed by MRSA   TOTAL KNEE ARTHROPLASTY Right    Scheduled Meds:  brivaracetam  100 mg Oral BID   enoxaparin (LOVENOX) injection  60 mg Subcutaneous Q12H   feeding supplement  237 mL Oral BID BM   lamoTRIgine  350 mg Oral BID   levofloxacin  250 mg Oral Daily   mouth rinse  15 mL Mouth Rinse BID   megestrol  400 mg Oral Daily   multivitamin with minerals  1 tablet Oral Daily   pantoprazole (PROTONIX) IV  40 mg Intravenous Q24H   sodium chloride flush  10-40 mL Intracatheter Q12H  venlafaxine XR  150 mg Oral Q breakfast   Warfarin - Pharmacist Dosing Inpatient   Does not apply q1600   Continuous Infusions:  lacosamide (VIMPAT) IV 200 mg (07/18/21 2135)   PRN Meds:.acetaminophen, clonazePAM, ondansetron **OR** ondansetron (ZOFRAN) IV, sodium chloride flush   Family History  Problem Relation Age of Onset   Anxiety disorder Mother    Diabetes Father    Seizures Neg Hx    Social History:  reports that she has never smoked. She has never used smokeless tobacco. She reports that she does not drink alcohol and does not use drugs.   Exam: Current  vital signs: BP 128/81 (BP Location: Left Arm)   Pulse (!) 110   Temp 98 F (36.7 C) (Oral)   Resp 14   Ht '5\' 6"'$  (1.676 m)   Wt 64.9 kg   SpO2 94%   BMI 23.09 kg/m  Vital signs in last 24 hours: Temp:  [98 F (36.7 C)-98.4 F (36.9 C)] 98 F (36.7 C) (05/28 0036) Pulse Rate:  [81-110] 110 (05/28 0021) Resp:  [14-18] 14 (05/28 0021) BP: (104-128)/(62-81) 128/81 (05/28 0021) SpO2:  [94 %-100 %] 94 % (05/28 0021)   Physical Exam  Constitutional: Appears chronically ill Psych: Minimally interactive Eyes: No scleral injection HENT: No oropharyngeal obstruction.  MSK: Right AKA Cardiovascular: Normal rate and regular rhythm, heart rate of 80s Respiratory: Effort normal, non-labored breathing GI: Soft.  No distension. There is no tenderness.   Neuro: Mental Status: Sleepy but awakens to noxious stimulation.  Does not follow any commands.  Does not make any verbal output.  Moans with noxious stimulation Cranial Nerves: II: Visual Fields are notable for a left hemianopia to blink to threat.  The right pupil is dilated to 5 mm and minimally responsive.  Left pupil is brisk and goes from 4 mm to 2 mm III,IV, VI: Moves eyes spontaneously in both directions, no clear fixed gaze deviation.  Saccadic eye movements V: Facial sensation is symmetric to light eyelash brush VII: Facial movement is notable for a mild left facial droop.  VIII: hearing is intact to voice, orients to examiner briefly Sensory/motor: Paratonia.  Decreased bulk.  At least 4/5 in the bilateral upper extremities, 2/5 left lower extremity, 3/5 right hip flexion Cerebellar: Unable to assess secondary to patient's mental status  Gait:  Nonambulatory at baseline   I have reviewed labs in epic and the results pertinent to this consultation are:  Basic Metabolic Panel: Recent Labs  Lab 07/14/21 0954 07/15/21 0457 07/16/21 0615 07/17/21 0749 07/19/21 0148  NA 139 142 140 140  --   K 4.3 3.3* 2.8* 4.1  --    CL 102 105 103 105  --   CO2 '26 28 29 27  '$ --   GLUCOSE 89 92 137* 96  --   BUN '12 10 6 6  '$ --   CREATININE 1.00 0.77 0.70 0.66  --   CALCIUM 9.6 9.0 9.0 9.1  --   MG  --   --  1.5*  --  1.5*  PHOS  --   --   --   --  2.2*    CBC: Recent Labs  Lab 07/14/21 0954 07/15/21 0457 07/16/21 0615 07/19/21 0148  WBC 5.0 5.2 5.9 4.7  NEUTROABS 2.5  --   --  2.0  HGB 10.6* 9.4* 9.5* 9.0*  HCT 36.1 31.0* 30.1* 28.1*  MCV 92.6 89.3 87.0 87.3  PLT 243 237 260 249    Coagulation  Studies: Recent Labs    07/16/21 0615 07/17/21 0749 07/18/21 0420 07/19/21 0148  LABPROT 17.6* 18.9* 24.4* 23.4*  INR 1.5* 1.6* 2.2* 2.1*      I have reviewed the images obtained:  Head CT personally reviewed, agree with radiology no acute intracranial process   Impression: Breakthrough seizure.  Pupillary change may reflect seizure activity/postictal change given asymmetry was not documented previously.  However given her history and use of chronic anticoagulation, she was taken for stat head CT on my recommendation to rule out acute bleed.  Suspect the etiology of her breakthrough is the fact that she was not getting clonazepam scheduled as she has been getting it at home.  Recommendations: -Resume home clonazepam 1 mg twice daily scheduled in addition to her other medications -Continue Vimpat, Briviact, and lamotrigine as ordered -Check electrolytes and replete as needed, given low magnesium at 1.5, I have put in for 2 g repletion -No further neurological work-up needed if patient continues to improve overnight.  If she is not improving, please reach out to neurology   [place] [phone] Available 7 PM to 7 AM, outside of these hours please call Neurologist on call as listed on Amion.

## 2021-07-19 NOTE — Progress Notes (Signed)
@  approx. 0010 this RN noticed pt HR elevated on monitor at nurse station in 110s with artifact noted on tracing (previously 80s-90s with no artifact). This RN went to room to find pt exhibiting seizure like activity (repeated jerking of RUE, pupils dilated to 56m minimally responsive to light, and not responding to commands even with painful stimuli). Dr. KHal Hope on-call for attending, notified via AMION '@[email or web address]'$ . MD promptly responded with call back and verbal order for 1 mg Ativan. MD came to bedside to assess and observed continued seizure-like activity (identical in appearance to that stated above) and additional 1 mg of Ativan ordered and given. Concurrently, STAT CBG obtained (77). MD verbally ordered 1/2 AMP of D50 be given.    Post the above interventions, seizure-like activity subsided with pt responding intermittently to verbal and tactile stimuli with eye opening and groaning but no intelligible speech. MD endorsed he would speak with Neurology to determine what if any additional interventions may be warranted. Will continue to monitor closely.

## 2021-07-19 NOTE — Progress Notes (Signed)
ANTICOAGULATION CONSULT NOTE - Follow Up Consult  Pharmacy Consult for Lovenox to warfarin Indication: History of DVT, lupus anticoagulant disorder  Allergies  Allergen Reactions   Vicodin [Hydrocodone-Acetaminophen] Nausea And Vomiting   Morphine Rash   Morphine And Related Rash    Allergic reaction only in iv form    Patient Measurements: Height: '5\' 6"'$  (167.6 cm) Weight: 63.3 kg (139 lb 8.8 oz) IBW/kg (Calculated) : 59.3  Vital Signs: Temp: 98.8 F (37.1 C) (05/28 0416) Temp Source: Oral (05/28 0416) BP: 104/58 (05/28 0416) Pulse Rate: 95 (05/28 0416)  Labs: Recent Labs    07/17/21 0749 07/18/21 0420 07/19/21 0148  HGB  --   --  9.0*  HCT  --   --  28.1*  PLT  --   --  249  LABPROT 18.9* 24.4* 23.4*  INR 1.6* 2.2* 2.1*  CREATININE 0.66  --  0.66     Estimated Creatinine Clearance: 75.3 mL/min (by C-G formula based on SCr of 0.66 mg/dL).  Assessment: 56 y.o. female with medical history significant of brain aneurysm rupture and intracranial hemorrhage s/p ventricular shunt, seizure disorder, DVT and lupus anticoagulant on chronic warfarin, s/p BKA, chronic dysphagia on pured diet, functional quadriplegia and bedbound, sent by family for acute mentation changes x2 days. Pharmacy consulted for resumption of home warfarin, now with Lovenox bridge.   PTA warfarin as of 06/16/21: '10mg'$  daily except 7.'5mg'$  Sun and Wed INR on admit 9.5 - received Vitamin K History of elevated INRs on recent admissions  INR reversed on 5/23 --> 2.1 today - started Lovenox 5/24. No bleeding noted, CBC stable.  Of note: Levaquin ordered x 4 days through 5/30. Likely require reduced doses.   Goal of Therapy:  INR 2-3 Monitor platelets by anticoagulation protocol: Yes   Plan:  INR >2 for at least 24 hours- STOP Lovenox 60 mg SQ q12h Warfarin 7.5 mg PO tonight x1 dose Monitor daily INR, CBC, clinical course, s/sx of bleed, PO intake/diet, Drug-Drug Interactions   Lestine Box,  PharmD PGY2 Infectious Diseases Pharmacy Resident   Please check [email or web address] for unit-specific pharmacy phone numbers

## 2021-07-20 DIAGNOSIS — G894 Chronic pain syndrome: Secondary | ICD-10-CM | POA: Diagnosis not present

## 2021-07-20 DIAGNOSIS — G9341 Metabolic encephalopathy: Secondary | ICD-10-CM | POA: Diagnosis not present

## 2021-07-20 DIAGNOSIS — Z86718 Personal history of other venous thrombosis and embolism: Secondary | ICD-10-CM | POA: Diagnosis not present

## 2021-07-20 DIAGNOSIS — E860 Dehydration: Secondary | ICD-10-CM | POA: Diagnosis not present

## 2021-07-20 LAB — COMPREHENSIVE METABOLIC PANEL
ALT: 130 U/L — ABNORMAL HIGH (ref 0–44)
AST: 204 U/L — ABNORMAL HIGH (ref 15–41)
Albumin: 2.5 g/dL — ABNORMAL LOW (ref 3.5–5.0)
Alkaline Phosphatase: 83 U/L (ref 38–126)
Anion gap: 8 (ref 5–15)
BUN: <5 mg/dL — ABNORMAL LOW (ref 6–20)
CO2: 26 mmol/L (ref 22–32)
Calcium: 9.1 mg/dL (ref 8.9–10.3)
Chloride: 105 mmol/L (ref 98–111)
Creatinine, Ser: 0.77 mg/dL (ref 0.44–1.00)
GFR, Estimated: >60 mL/min (ref >60)
Glucose, Bld: 102 mg/dL — ABNORMAL HIGH (ref 70–99)
Potassium: 3.9 mmol/L (ref 3.5–5.1)
Sodium: 139 mmol/L (ref 135–145)
Total Bilirubin: 0.3 mg/dL (ref 0.3–1.2)
Total Protein: 6.3 g/dL — ABNORMAL LOW (ref 6.5–8.1)

## 2021-07-20 LAB — PROTIME-INR
INR: 3 — ABNORMAL HIGH (ref 0.8–1.2)
Prothrombin Time: 30.9 seconds — ABNORMAL HIGH (ref 11.4–15.2)

## 2021-07-20 MED ORDER — WARFARIN SODIUM 2.5 MG PO TABS
2.5000 mg | ORAL_TABLET | Freq: Once | ORAL | Status: DC
  Filled 2021-07-20: qty 1

## 2021-07-20 MED ORDER — LEVOFLOXACIN 250 MG PO TABS: 250.0000 mg | ORAL_TABLET | Freq: Every day | ORAL | 0 refills | Status: DC

## 2021-07-20 NOTE — Care Management Important Message (Signed)
Important Message  Patient Details  Name: Cassandra Andrade MRN: [id number] Date of Birth: Nov 14, 1965   Medicare Important Message Given:  Yes     Ellen Goris 07/20/2021, 1:54 PM

## 2021-07-20 NOTE — TOC Transition Note (Signed)
Transition of Care [facility]) - CM/SW Discharge Note   Patient Details  Name: Cassandra Andrade MRN: [id number] Date of Birth: 18-Jun-1965  [facility] CM/SW Contact:  Angelita Ingles, RN Phone Number:[phone]  07/20/2021, 1:53 PM   Clinical Narrative:    CM spoke with mother Ronita Hipps to confirm that patient will require [facility] transport. Transportation has been arranged with [facility] and address has been verified. d/c packet has been delivered to the unit per CM. No other needs noted at this time.      Barriers to Discharge: Continued Medical Work up   Patient Goals and CMS Choice     Choice offered to / list presented to : Parent  Discharge Placement                       Discharge Plan and Services   Discharge Planning Services: CM Consult                                 Social Determinants of Health (SDOH) Interventions     Readmission Risk Interventions     View : No data to display.

## 2021-07-20 NOTE — Discharge Summary (Addendum)
Physician Discharge Summary   Patient: Cassandra Andrade MRN: [id number] DOB: [birth year]  Admit date:     07/14/2021  Discharge date: 07/20/21  Discharge Physician: Patrecia Pour   PCP: Berkley Harvey, NP   Recommendations at discharge:  Complete treatment for UTI with levaquin on 5/30.  Pt to check INR at home, follow up with her clinic for ongoing coumadin dosing recommendations. Recommended she take '5mg'$  daily for 2 days after discharge, then return to usual dosing, change due to levaquin. INR 3.0 at discharge.  Discharge Diagnoses: Principal Problem:   Acute metabolic encephalopathy Active Problems:   Dehydration   S/P BKA (below knee amputation) (HCC)   Seizure disorder (HCC)   Lupus anticoagulant disorder (HCC)   Chronic pain associated with significant psychosocial dysfunction   Major depression, recurrent (HCC)   History of deep venous thrombosis   Pseudomonas urinary tract infection   Malnutrition of moderate degree   Hypokalemia   Hypomagnesemia   Pressure injury of skin   Hospital Course: Cassandra Andrade is a 56 y.o. 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 Per mother patient has been eating and drinking very little, "body hot to touch" , urine smelly, patient became confused and stopped talking altogether for the last 2 days PTA.  In ED- found to have UTI/ Acute metabolic encephalopathy, dehydration. Urine culture with pseudomonas. Antibiotics were given, tailored to culture results, and the patient's mental status improved. There was an episode of seizure on 5/27 which have not recurred. Patient will need neurology follow up.  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  - Overall improving. Will continue abx as below.  - Continue home AEDs. Breakthrough seizure 5/27, scheduled home benzodiazepine per neurology recommendations. ?if AED levels also affected with levaquin. Checking lamictal trough in AM. - Unequal pupils, no acute changes on CT head repeated 5/28.  - Delirium precautions    Urinary tract infection due to Pseudomonas and Enterococcus: - Switched to levaquin per discussion with ID. Continue through last dose 5/30.     Moderate protein calorie malnutrition:  Chronic dysphagia: Augment nutritional status, continue Megace, continue dysphagia 1 diet as per speech.  Speech consulted-on dysphagia 1 diet, continue PPI.  Continue to augment nutritional status   Hypokalemia Hypomagnesemia: Repleted.   Dehydration:Improved. Encourage oral intake   History of deep venous thrombosis Lupus anticoagulant disorder: - Continue coumadin per pharmacy dosing, will recommend '5mg'$  daily x2 days, then back up to her usual dosing (decresed due to levaquin). Lovenox bridging stopped after INR therapeutic x24 hours  - Will need INR checks frequently going forward in light of fluoroquinolone.   Supratherapeutic INR: On arrival s/p vitamin K.    Chronic pain associated with significant psychosocial dysfunction, depression: - Continue pain control, effexor   Stage 4 left pretibial pressure injury, POA: Does not appear infected.  - Offload and local wound care.   Consultants: Neurology Procedures performed: None  Disposition: Home Diet recommendation:  Dysphagia type 1   Liquid DISCHARGE MEDICATION: Allergies as of 07/20/2021       Reactions  Vicodin [hydrocodone-acetaminophen] Nausea And Vomiting   Morphine Rash   Morphine And  Related Rash   Allergic reaction only in iv form        Medication List     TAKE these medications    acetaminophen 650 MG CR tablet Commonly known as: TYLENOL Take 650 mg by mouth every 8 (eight) hours as needed for pain.   Briviact 100 MG Tabs tablet Generic drug: brivaracetam Take 100 mg by mouth 2 (two) times daily.   clonazePAM 1 MG tablet Commonly known as: KLONOPIN One tablet twice daily as needed for seizure. What changed:  how much to take how to take this when to take this reasons to take this   [employer] Diabetics Dry Skin Crea Apply 1 application. topically as needed (skin problems).   ketoconazole 2 % shampoo Commonly known as: NIZORAL Apply 1 application topically 2 (two) times a week. Monday,wednesday   lacosamide 200 MG Tabs tablet Commonly known as: VIMPAT Take 1 tablet (200 mg total) by mouth 2 (two) times daily.   lamoTRIgine 25 MG tablet Commonly known as: LaMICtal Take 2 tablets (50 mg total) by mouth 2 (two) times daily along with 300 mg 2 (two) times daily. Total of 350 mg twice daily (300 mg+ 50 mg) What changed:  how much to take how to take this when to take this   lamoTRIgine 100 MG tablet Commonly known as: LAMICTAL Take 3 tablets (300 mg total) by mouth 2 (two) times daily along with 50 mg 2 (two) times daily Total of 350 mg twice daily (300 mg+ 50 mg) What changed:  how much to take how to take this when to take this   levofloxacin 250 MG tablet Commonly known as: LEVAQUIN Take 1 tablet (250 mg total) by mouth daily. Start taking on: Jul 21, 2021   levothyroxine 50 MCG tablet Commonly known as: SYNTHROID Take 50 mcg by mouth every evening.   omeprazole 20 MG capsule Commonly known as: PRILOSEC Take 20 mg by mouth at bedtime.   prenatal multivitamin Tabs tablet Take 1 tablet by mouth daily.   promethazine 25 MG tablet Commonly known as: PHENERGAN Take 25 mg by mouth every 8 (eight) hours as needed for nausea or  vomiting.   venlafaxine XR 150 MG 24 hr capsule Commonly known as: EFFEXOR-XR Take 1 capsule (150 mg total) by mouth daily with breakfast.   vitamin C 1000 MG tablet Take 1,000 mg by mouth daily.   warfarin 5 MG tablet Commonly known as: COUMADIN Take 1.5-2 tablets (7.5-10 mg total) by mouth See admin instructions. Pt takes one and a half tablets (7.'5mg'$  total) on Sunday and Wednesday and 2 tablets ('10mg'$  total) all other days. What changed: additional instructions        Discharge Exam: BP 102/62   Pulse 87   Temp 98.4 F (36.9 C) (Oral)   Resp 14   Ht '5\' 6"'$  (1.676 m)   Wt 63.7 kg   SpO2 99%   BMI 22.67 kg/m   Chronically ill-appearing female who is interactive and speaks with me, wants to go home.  Clear anterolaterally, nonlabored on room air RRR R BKA Alert, oriented. Functional quadriplegia  Condition at discharge: stable  The results of significant diagnostics from this hospitalization (including imaging, microbiology, ancillary and laboratory) are listed below for reference.   Imaging Studies: DG Chest 1 View  Result Date: 07/14/2021 CLINICAL DATA:  Altered mental status, possible aspiration. EXAM: CHEST  1 VIEW COMPARISON:  June 09, 2021 radiograph FINDINGS: Stimulating generator in the left chest with lead projecting over the left neck. The heart size and mediastinal contours are within normal limits. No focal airspace consolidation. No visible pleural effusion or pneumothorax. The visualized skeletal structures are unremarkable. IMPRESSION: No acute cardiopulmonary findings. Electronically Signed   By: Dahlia Bailiff M.D.   On: 07/14/2021 12:32   CT Head Wo Contrast  Result Date: 07/14/2021 CLINICAL DATA:  Mental status change, unknown cause EXAM: CT HEAD WITHOUT CONTRAST TECHNIQUE: Contiguous axial images were obtained from the base of the skull through the vertex without intravenous contrast. RADIATION DOSE REDUCTION: This exam was performed according to the  departmental dose-optimization program which includes automated exposure control, adjustment of the mA and/or kV according to patient size and/or use of iterative reconstruction technique. COMPARISON:  06/09/2021 head CT FINDINGS: Brain: Chronic severe right temporal greater than frontoparietal encephalomalacia with ex vacuo dilatation of the right lateral ventricle. No evidence of parenchymal hemorrhage or extra-axial fluid collection. No mass lesion, mass effect, or midline shift. No CT evidence of acute infarction. Nonspecific mild to moderate subcortical and periventricular white matter hypodensity, most in keeping with chronic small vessel ischemic change. Stable ventricles. Vascular: No acute abnormality. Surgical clip noted in the right middle cranial fossa. Skull: No evidence of calvarial fracture. Stable right pterional craniotomy change. Sinuses/Orbits: The visualized paranasal sinuses are essentially clear. Other:  The mastoid air cells are unopacified. IMPRESSION: 1. No evidence of acute intracranial abnormality. 2. Chronic severe right temporal greater than frontoparietal encephalomalacia with ex vacuo dilatation of the right lateral ventricle. Stable right pterional craniotomy change. 3. Mild-to-moderate chronic small vessel ischemic changes in the cerebral white matter. Electronically Signed   By: Ilona Sorrel M.D.   On: 07/14/2021 10:32   CT HEAD CODE STROKE WO CONTRAST  Result Date: 07/19/2021 CLINICAL DATA:  Code stroke. Seizure activity, pupils unequal, history of aneurysm coil EXAM: CT HEAD WITHOUT CONTRAST TECHNIQUE: Contiguous axial images were obtained from the base of the skull through the vertex without intravenous contrast. RADIATION DOSE REDUCTION: This exam was performed according to the departmental dose-optimization program which includes automated exposure control, adjustment of the mA and/or kV according to patient size and/or use of iterative reconstruction technique. COMPARISON:   07/14/2021 FINDINGS: Brain: Redemonstrated right temporal and frontoparietal encephalomalacia with ex vacuo dilatation of the right lateral ventricle. No evidence of additional hypodensity, acute hemorrhage, mass, mass effect, or midline shift. Vascular: No hyperdense vessel. A clip is noted in the right middle cranial fossa. Skull: Prior right pterional craniotomy.  No acute fracture. Sinuses/Orbits: No acute finding. Other: The mastoid air cells are well aerated. ASPECTS [facility] Stroke Program Early CT Score) - Ganglionic level infarction (caudate, lentiform nuclei, internal capsule, insula, M1-M3 cortex): 7 - Supraganglionic infarction (M4-M6 cortex): 3 Total score (0-10 with 10 being normal): 10 IMPRESSION: 1. No acute intracranial process. Overall unchanged appearance of the brain. 2. ASPECTS is 10 Code stroke imaging results were communicated on 07/19/2021 at 2:27 am to provider BHAGAT via secure text paging. Electronically Signed   By: Merilyn Baba M.D.   On: 07/19/2021 02:28    Microbiology: Results for orders placed or performed during the hospital encounter of 07/14/21  Urine Culture     Status: Abnormal (Preliminary result)   Collection Time: 07/14/21 11:17 AM   Specimen: In/Out Cath Urine  Result Value Ref Range Status   Specimen Description IN/OUT CATH URINE  Final   Special Requests   Final  NONE Performed at [facility], [place] [address] [place], [place] [address]    Culture (A)  Final    >=100,000 COLONIES/mL PSEUDOMONAS AERUGINOSA 60,000 COLONIES/mL ENTEROCOCCUS FAECIUM    Report Status PENDING  Incomplete   Organism ID, Bacteria PSEUDOMONAS AERUGINOSA (A)  Final   Organism ID, Bacteria ENTEROCOCCUS FAECIUM (A)  Final      Susceptibility   Enterococcus faecium - MIC*    AMPICILLIN <=2 SENSITIVE Sensitive     LEVOFLOXACIN 2 SENSITIVE Sensitive     NITROFURANTOIN 64 INTERMEDIATE Intermediate     VANCOMYCIN <=0.5 SENSITIVE Sensitive     * 60,000 COLONIES/mL  ENTEROCOCCUS FAECIUM   Pseudomonas aeruginosa - MIC*    CEFTAZIDIME 4 SENSITIVE Sensitive     CIPROFLOXACIN <=0.25 SENSITIVE Sensitive     GENTAMICIN <=1 SENSITIVE Sensitive     IMIPENEM 2 SENSITIVE Sensitive     PIP/TAZO 8 SENSITIVE Sensitive     CEFEPIME 2 SENSITIVE Sensitive     * >=100,000 COLONIES/mL PSEUDOMONAS AERUGINOSA    Labs: CBC: Recent Labs  Lab 07/14/21 0954 07/15/21 0457 07/16/21 0615 07/19/21 0148  WBC 5.0 5.2 5.9 4.7  NEUTROABS 2.5  --   --  2.0  HGB 10.6* 9.4* 9.5* 9.0*  HCT 36.1 31.0* 30.1* 28.1*  MCV 92.6 89.3 87.0 87.3  PLT 243 237 260 892   Basic Metabolic Panel: Recent Labs  Lab 07/15/21 0457 07/16/21 0615 07/17/21 0749 07/19/21 0148 07/20/21 0025  NA 142 140 140 141 139  K 3.3* 2.8* 4.1 3.6 3.9  CL 105 103 105 105 105  CO2 '28 29 27 26 26  '$ GLUCOSE 92 137* 96 118* 102*  BUN '10 6 6 6 '$ <5*  CREATININE 0.77 0.70 0.66 0.66 0.77  CALCIUM 9.0 9.0 9.1 9.5 9.1  MG  --  1.5*  --  1.5*  --   PHOS  --   --   --  2.2*  --    Liver Function Tests: Recent Labs  Lab 07/14/21 0954 07/16/21 0615 07/19/21 0148 07/20/21 0025  AST 22 24 170* 204*  ALT 16 18 86* 130*  ALKPHOS 92 87 93 83  BILITOT 0.4 0.4 0.2* 0.3  PROT 6.8 6.4* 6.4* 6.3*  ALBUMIN 2.8* 2.4* 2.5* 2.5*   CBG: Recent Labs  Lab 07/19/21 0026  GLUCAP 77    Discharge time spent: greater than 30 minutes.  Signed: Patrecia Pour, MD [facility] 07/20/2021

## 2021-07-20 NOTE — Progress Notes (Signed)
ANTICOAGULATION CONSULT NOTE - Follow Up Consult  Pharmacy Consult for Lovenox to warfarin Indication: History of DVT, lupus anticoagulant disorder  Allergies  Allergen Reactions   Vicodin [Hydrocodone-Acetaminophen] Nausea And Vomiting   Morphine Rash   Morphine And Related Rash    Allergic reaction only in iv form    Patient Measurements: Height: '5\' 6"'$  (167.6 cm) Weight: 63.7 kg (140 lb 6.9 oz) IBW/kg (Calculated) : 59.3  Vital Signs: Temp: 98.4 F (36.9 C) (05/29 0729) Temp Source: Oral (05/29 0729) BP: 102/62 (05/29 0729) Pulse Rate: 87 (05/29 0729)  Labs: Recent Labs    07/17/21 0749 07/18/21 0420 07/19/21 0148 07/20/21 0025  HGB  --   --  9.0*  --   HCT  --   --  28.1*  --   PLT  --   --  249  --   LABPROT 18.9* 24.4* 23.4* 30.9*  INR 1.6* 2.2* 2.1* 3.0*  CREATININE 0.66  --  0.66 0.77     Estimated Creatinine Clearance: 75.3 mL/min (by C-G formula based on SCr of 0.77 mg/dL).  Assessment: 56 y.o. female with medical history significant of brain aneurysm rupture and intracranial hemorrhage s/p ventricular shunt, seizure disorder, DVT and lupus anticoagulant on chronic warfarin, s/p BKA, chronic dysphagia on pured diet, functional quadriplegia and bedbound, sent by family for acute mentation changes x2 days. Pharmacy consulted for resumption of home warfarin, now with Lovenox bridge.   PTA warfarin as of 06/16/21: '10mg'$  daily except 7.'5mg'$  Sun and Wed INR on admit 9.5 - received Vitamin K History of elevated INRs on recent admissions  5/29: INR 3.0 this am at upper end of goal, levaquin likely contributing to higher INR as well. CBC stable and no signs/symptoms of bleeding. Will dose at reduced dose tonight  Of note: Levaquin ordered x 4 days through 5/30. Likely require reduced doses.   Goal of Therapy:  INR 2-3 Monitor platelets by anticoagulation protocol: Yes   Plan:  Warfarin 2.5 mg PO tonight x1 dose Monitor daily INR, CBC, clinical course, s/sx  of bleed, PO intake/diet, Drug-Drug Interactions   Lestine Box, PharmD PGY2 Infectious Diseases Pharmacy Resident   Please check [email or web address] for unit-specific pharmacy phone numbers

## 2021-07-21 LAB — URINE CULTURE: Culture: >=100000 — AB

## 2021-07-21 LAB — LAMOTRIGINE LEVEL: Lamotrigine Lvl: 24 ug/mL — ABNORMAL HIGH (ref 2.0–20.0)

## 2021-07-30 ENCOUNTER — Other Ambulatory Visit (HOSPITAL_COMMUNITY): Payer: Self-pay | Admitting: Psychiatry

## 2021-07-30 ENCOUNTER — Other Ambulatory Visit: Payer: Self-pay | Admitting: Neurology

## 2021-07-30 DIAGNOSIS — F419 Anxiety disorder, unspecified: Secondary | ICD-10-CM

## 2021-07-30 DIAGNOSIS — F321 Major depressive disorder, single episode, moderate: Secondary | ICD-10-CM

## 2021-08-11 ENCOUNTER — Observation Stay (HOSPITAL_COMMUNITY): Payer: Medicare Other

## 2021-08-11 ENCOUNTER — Emergency Department (HOSPITAL_COMMUNITY): Payer: Medicare Other

## 2021-08-11 ENCOUNTER — Other Ambulatory Visit: Payer: Self-pay

## 2021-08-11 ENCOUNTER — Encounter (HOSPITAL_COMMUNITY): Payer: Self-pay

## 2021-08-11 ENCOUNTER — Inpatient Hospital Stay (HOSPITAL_COMMUNITY)
Admission: EM | Admit: 2021-08-11 | Discharge: 2021-08-25 | DRG: 695 | Disposition: A | Discharge status: Home Health | Payer: Medicare Other | Attending: Internal Medicine | Admitting: Internal Medicine

## 2021-08-11 DIAGNOSIS — Z85830 Personal history of malignant neoplasm of bone: Secondary | ICD-10-CM

## 2021-08-11 DIAGNOSIS — Z79890 Hormone replacement therapy (postmenopausal): Secondary | ICD-10-CM

## 2021-08-11 DIAGNOSIS — R509 Fever, unspecified: Secondary | ICD-10-CM | POA: Diagnosis not present

## 2021-08-11 DIAGNOSIS — R627 Adult failure to thrive: Secondary | ICD-10-CM | POA: Diagnosis present

## 2021-08-11 DIAGNOSIS — G40909 Epilepsy, unspecified, not intractable, without status epilepticus: Secondary | ICD-10-CM | POA: Diagnosis present

## 2021-08-11 DIAGNOSIS — Z888 Allergy status to other drugs, medicaments and biological substances status: Secondary | ICD-10-CM

## 2021-08-11 DIAGNOSIS — F419 Anxiety disorder, unspecified: Secondary | ICD-10-CM | POA: Diagnosis present

## 2021-08-11 DIAGNOSIS — Z79899 Other long term (current) drug therapy: Secondary | ICD-10-CM

## 2021-08-11 DIAGNOSIS — Z89611 Acquired absence of right leg above knee: Secondary | ICD-10-CM

## 2021-08-11 DIAGNOSIS — Z539 Procedure and treatment not carried out, unspecified reason: Secondary | ICD-10-CM | POA: Diagnosis present

## 2021-08-11 DIAGNOSIS — E039 Hypothyroidism, unspecified: Secondary | ICD-10-CM | POA: Diagnosis present

## 2021-08-11 DIAGNOSIS — L89152 Pressure ulcer of sacral region, stage 2: Secondary | ICD-10-CM | POA: Diagnosis present

## 2021-08-11 DIAGNOSIS — R000 Tachycardia, unspecified: Secondary | ICD-10-CM | POA: Diagnosis not present

## 2021-08-11 DIAGNOSIS — R791 Abnormal coagulation profile: Principal | ICD-10-CM | POA: Diagnosis present

## 2021-08-11 DIAGNOSIS — Z818 Family history of other mental and behavioral disorders: Secondary | ICD-10-CM

## 2021-08-11 DIAGNOSIS — R532 Functional quadriplegia: Secondary | ICD-10-CM | POA: Diagnosis present

## 2021-08-11 DIAGNOSIS — Z833 Family history of diabetes mellitus: Secondary | ICD-10-CM

## 2021-08-11 DIAGNOSIS — E876 Hypokalemia: Secondary | ICD-10-CM | POA: Diagnosis present

## 2021-08-11 DIAGNOSIS — Z931 Gastrostomy status: Secondary | ICD-10-CM

## 2021-08-11 DIAGNOSIS — R310 Gross hematuria: Principal | ICD-10-CM | POA: Diagnosis present

## 2021-08-11 DIAGNOSIS — R58 Hemorrhage, not elsewhere classified: Secondary | ICD-10-CM | POA: Diagnosis not present

## 2021-08-11 DIAGNOSIS — Z982 Presence of cerebrospinal fluid drainage device: Secondary | ICD-10-CM

## 2021-08-11 DIAGNOSIS — T45515A Adverse effect of anticoagulants, initial encounter: Secondary | ICD-10-CM | POA: Diagnosis present

## 2021-08-11 DIAGNOSIS — D6862 Lupus anticoagulant syndrome: Secondary | ICD-10-CM | POA: Diagnosis present

## 2021-08-11 DIAGNOSIS — Z7901 Long term (current) use of anticoagulants: Secondary | ICD-10-CM

## 2021-08-11 DIAGNOSIS — R1310 Dysphagia, unspecified: Secondary | ICD-10-CM | POA: Diagnosis not present

## 2021-08-11 DIAGNOSIS — Z885 Allergy status to narcotic agent status: Secondary | ICD-10-CM

## 2021-08-11 DIAGNOSIS — Z8673 Personal history of transient ischemic attack (TIA), and cerebral infarction without residual deficits: Secondary | ICD-10-CM

## 2021-08-11 DIAGNOSIS — K219 Gastro-esophageal reflux disease without esophagitis: Secondary | ICD-10-CM | POA: Diagnosis present

## 2021-08-11 DIAGNOSIS — Z20822 Contact with and (suspected) exposure to covid-19: Secondary | ICD-10-CM | POA: Diagnosis present

## 2021-08-11 DIAGNOSIS — D539 Nutritional anemia, unspecified: Secondary | ICD-10-CM | POA: Diagnosis present

## 2021-08-11 DIAGNOSIS — D62 Acute posthemorrhagic anemia: Secondary | ICD-10-CM | POA: Diagnosis not present

## 2021-08-11 DIAGNOSIS — R319 Hematuria, unspecified: Secondary | ICD-10-CM | POA: Diagnosis present

## 2021-08-11 DIAGNOSIS — Z86718 Personal history of other venous thrombosis and embolism: Secondary | ICD-10-CM

## 2021-08-11 DIAGNOSIS — D6832 Hemorrhagic disorder due to extrinsic circulating anticoagulants: Secondary | ICD-10-CM | POA: Diagnosis present

## 2021-08-11 DIAGNOSIS — F32A Depression, unspecified: Secondary | ICD-10-CM | POA: Diagnosis present

## 2021-08-11 DIAGNOSIS — G9341 Metabolic encephalopathy: Secondary | ICD-10-CM | POA: Diagnosis not present

## 2021-08-11 DIAGNOSIS — Z7401 Bed confinement status: Secondary | ICD-10-CM

## 2021-08-11 LAB — I-STAT VENOUS BLOOD GAS, ED
Acid-Base Excess: 5 mmol/L — ABNORMAL HIGH (ref 0.0–2.0)
Bicarbonate: 30.6 mmol/L — ABNORMAL HIGH (ref 20.0–28.0)
Calcium, Ion: 1.29 mmol/L (ref 1.15–1.40)
HCT: 30 % — ABNORMAL LOW (ref 36.0–46.0)
Hemoglobin: 10.2 g/dL — ABNORMAL LOW (ref 12.0–15.0)
O2 Saturation: 84 %
Potassium: 3.6 mmol/L (ref 3.5–5.1)
Sodium: 144 mmol/L (ref 135–145)
TCO2: 32 mmol/L (ref 22–32)
pCO2, Ven: 47 mmHg (ref 44–60)
pH, Ven: 7.422 (ref 7.25–7.43)
pO2, Ven: 48 mmHg — ABNORMAL HIGH (ref 32–45)

## 2021-08-11 LAB — I-STAT CHEM 8, ED
BUN: 17 mg/dL (ref 6–20)
Calcium, Ion: 1.27 mmol/L (ref 1.15–1.40)
Chloride: 104 mmol/L (ref 98–111)
Creatinine, Ser: 0.8 mg/dL (ref 0.44–1.00)
Glucose, Bld: 109 mg/dL — ABNORMAL HIGH (ref 70–99)
HCT: 30 % — ABNORMAL LOW (ref 36.0–46.0)
Hemoglobin: 10.2 g/dL — ABNORMAL LOW (ref 12.0–15.0)
Potassium: 3.6 mmol/L (ref 3.5–5.1)
Sodium: 144 mmol/L (ref 135–145)
TCO2: 28 mmol/L (ref 22–32)

## 2021-08-11 LAB — COMPREHENSIVE METABOLIC PANEL
ALT: 12 U/L (ref 0–44)
AST: 13 U/L — ABNORMAL LOW (ref 15–41)
Albumin: 3.1 g/dL — ABNORMAL LOW (ref 3.5–5.0)
Alkaline Phosphatase: 95 U/L (ref 38–126)
Anion gap: 13 (ref 5–15)
BUN: 15 mg/dL (ref 6–20)
CO2: 28 mmol/L (ref 22–32)
Calcium: 10.4 mg/dL — ABNORMAL HIGH (ref 8.9–10.3)
Chloride: 105 mmol/L (ref 98–111)
Creatinine, Ser: 0.93 mg/dL (ref 0.44–1.00)
GFR, Estimated: >60 mL/min (ref >60)
Glucose, Bld: 113 mg/dL — ABNORMAL HIGH (ref 70–99)
Potassium: 3.6 mmol/L (ref 3.5–5.1)
Sodium: 146 mmol/L — ABNORMAL HIGH (ref 135–145)
Total Bilirubin: 0.9 mg/dL (ref 0.3–1.2)
Total Protein: 7.4 g/dL (ref 6.5–8.1)

## 2021-08-11 LAB — TYPE AND SCREEN
ABO/RH(D): O POS
Antibody Screen: NEGATIVE

## 2021-08-11 LAB — PROTIME-INR
INR: >10 (ref 0.8–1.2)
INR: 2.1 — ABNORMAL HIGH (ref 0.8–1.2)
Prothrombin Time: >90 seconds — ABNORMAL HIGH (ref 11.4–15.2)
Prothrombin Time: 23.7 seconds — ABNORMAL HIGH (ref 11.4–15.2)

## 2021-08-11 LAB — URINALYSIS, MICROSCOPIC (REFLEX)
RBC / HPF: >50 RBC/hpf (ref 0–5)
Squamous Epithelial / HPF: NONE SEEN (ref 0–5)
WBC, UA: NONE SEEN WBC/hpf (ref 0–5)

## 2021-08-11 LAB — AMMONIA: Ammonia: 37 umol/L — ABNORMAL HIGH (ref 9–35)

## 2021-08-11 LAB — LACTIC ACID, PLASMA: Lactic Acid, Venous: 1.2 mmol/L (ref 0.5–1.9)

## 2021-08-11 LAB — CBC WITH DIFFERENTIAL/PLATELET
Abs Immature Granulocytes: 0.06 10*3/uL (ref 0.00–0.07)
Basophils Absolute: 0.1 10*3/uL (ref 0.0–0.1)
Basophils Relative: 1 %
Eosinophils Absolute: 0.1 10*3/uL (ref 0.0–0.5)
Eosinophils Relative: 1 %
HCT: 29.8 % — ABNORMAL LOW (ref 36.0–46.0)
Hemoglobin: 9 g/dL — ABNORMAL LOW (ref 12.0–15.0)
Immature Granulocytes: 1 %
Lymphocytes Relative: 21 %
Lymphs Abs: 2 10*3/uL (ref 0.7–4.0)
MCH: 26.5 pg (ref 26.0–34.0)
MCHC: 30.2 g/dL (ref 30.0–36.0)
MCV: 87.9 fL (ref 80.0–100.0)
Monocytes Absolute: 0.6 10*3/uL (ref 0.1–1.0)
Monocytes Relative: 6 %
Neutro Abs: 6.7 10*3/uL (ref 1.7–7.7)
Neutrophils Relative %: 70 %
Platelets: 317 10*3/uL (ref 150–400)
RBC: 3.39 MIL/uL — ABNORMAL LOW (ref 3.87–5.11)
RDW: 15.4 % (ref 11.5–15.5)
WBC: 9.4 10*3/uL (ref 4.0–10.5)
nRBC: 0 % (ref 0.0–0.2)

## 2021-08-11 LAB — HEMOGLOBIN AND HEMATOCRIT, BLOOD
HCT: 27.9 % — ABNORMAL LOW (ref 36.0–46.0)
HCT: 28.6 % — ABNORMAL LOW (ref 36.0–46.0)
Hemoglobin: 8.6 g/dL — ABNORMAL LOW (ref 12.0–15.0)
Hemoglobin: 8.8 g/dL — ABNORMAL LOW (ref 12.0–15.0)

## 2021-08-11 LAB — URINALYSIS, ROUTINE W REFLEX MICROSCOPIC

## 2021-08-11 LAB — RESP PANEL BY RT-PCR (FLU A&B, COVID) ARPGX2
Influenza A by PCR: NEGATIVE
Influenza B by PCR: NEGATIVE
SARS Coronavirus 2 by RT PCR: NEGATIVE

## 2021-08-11 LAB — APTT: aPTT: 179 seconds (ref 24–36)

## 2021-08-11 LAB — CBG MONITORING, ED: Glucose-Capillary: 110 mg/dL — ABNORMAL HIGH (ref 70–99)

## 2021-08-11 MED ORDER — OLANZAPINE 5 MG PO TBDP
5.0000 mg | ORAL_TABLET | Freq: Two times a day (BID) | ORAL | Status: DC
  Administered 2021-08-12 – 2021-08-25 (×23): 5 mg via ORAL
  Filled 2021-08-11 (×30): qty 1

## 2021-08-11 MED ORDER — SODIUM CHLORIDE 0.9% FLUSH
3.0000 mL | Freq: Two times a day (BID) | INTRAVENOUS | Status: DC
  Administered 2021-08-11 (×2): 3 mL via INTRAVENOUS

## 2021-08-11 MED ORDER — ACETAMINOPHEN 650 MG RE SUPP
650.0000 mg | Freq: Once | RECTAL | Status: AC
  Administered 2021-08-11: 650 mg via RECTAL
  Filled 2021-08-11: qty 1

## 2021-08-11 MED ORDER — LEVOTHYROXINE SODIUM 50 MCG PO TABS: 50.0000 ug | ORAL_TABLET | Freq: Every evening | ORAL | Status: DC

## 2021-08-11 MED ORDER — ALBUTEROL SULFATE (2.5 MG/3ML) 0.083% IN NEBU: 2.5000 mg | INHALATION_SOLUTION | Freq: Four times a day (QID) | RESPIRATORY_TRACT | Status: DC | PRN

## 2021-08-11 MED ORDER — PANTOPRAZOLE SODIUM 40 MG PO TBEC
40.0000 mg | DELAYED_RELEASE_TABLET | Freq: Every day | ORAL | Status: DC
  Administered 2021-08-14 – 2021-08-19 (×6): 40 mg via ORAL
  Filled 2021-08-11 (×8): qty 1

## 2021-08-11 MED ORDER — VITAMIN K1 10 MG/ML IJ SOLN
2.5000 mg | Freq: Once | INTRAVENOUS | Status: AC
  Administered 2021-08-11: 2.5 mg via INTRAVENOUS
  Filled 2021-08-11: qty 0.25

## 2021-08-11 MED ORDER — LAMOTRIGINE 100 MG PO TABS
350.0000 mg | ORAL_TABLET | Freq: Two times a day (BID) | ORAL | Status: DC
  Administered 2021-08-12: 350 mg via ORAL
  Filled 2021-08-11 (×4): qty 2

## 2021-08-11 MED ORDER — LACOSAMIDE 50 MG PO TABS
200.0000 mg | ORAL_TABLET | Freq: Two times a day (BID) | ORAL | Status: DC
  Administered 2021-08-12: 200 mg via ORAL
  Filled 2021-08-11: qty 4

## 2021-08-11 MED ORDER — LAMOTRIGINE 150 MG PO TABS
300.0000 mg | ORAL_TABLET | ORAL | Status: DC
Start: 2021-08-11 — End: 2021-08-11

## 2021-08-11 MED ORDER — VENLAFAXINE HCL ER 75 MG PO CP24
150.0000 mg | ORAL_CAPSULE | Freq: Every day | ORAL | Status: DC
  Administered 2021-08-14 – 2021-08-25 (×10): 150 mg via ORAL
  Filled 2021-08-11: qty 2
  Filled 2021-08-11: qty 1
  Filled 2021-08-11 (×10): qty 2

## 2021-08-11 MED ORDER — IOHEXOL 300 MG/ML  SOLN
100.0000 mL | Freq: Once | INTRAMUSCULAR | Status: AC | PRN
Start: 2021-08-11 — End: 2021-08-11
  Administered 2021-08-11: 100 mL via INTRAVENOUS

## 2021-08-11 MED ORDER — CLONAZEPAM 0.5 MG PO TABS: 1.0000 mg | ORAL_TABLET | Freq: Two times a day (BID) | ORAL | Status: DC | PRN

## 2021-08-11 MED ORDER — ACETAMINOPHEN 325 MG PO TABS
650.0000 mg | ORAL_TABLET | Freq: Four times a day (QID) | ORAL | Status: DC | PRN
  Administered 2021-08-16 – 2021-08-23 (×7): 650 mg via ORAL
  Filled 2021-08-11 (×8): qty 2

## 2021-08-11 MED ORDER — ACETAMINOPHEN 650 MG RE SUPP: 650.0000 mg | Freq: Four times a day (QID) | RECTAL | Status: DC | PRN

## 2021-08-11 MED ORDER — LAMOTRIGINE 25 MG PO TABS
50.0000 mg | ORAL_TABLET | ORAL | Status: DC
Start: 2021-08-11 — End: 2021-08-11

## 2021-08-11 MED ORDER — LACTATED RINGERS IV BOLUS
1000.0000 mL | Freq: Once | INTRAVENOUS | Status: AC
  Administered 2021-08-11: 1000 mL via INTRAVENOUS

## 2021-08-11 MED ORDER — BRIVARACETAM 100 MG PO TABS: 100.0000 mg | ORAL_TABLET | Freq: Two times a day (BID) | ORAL | Status: DC

## 2021-08-11 NOTE — ED Notes (Signed)
Patient transported to CT 

## 2021-08-11 NOTE — ED Provider Notes (Signed)
[facility] EMERGENCY DEPARTMENT Provider Note   CSN: [id number] Arrival date & time: 08/11/21  5093     History  Chief Complaint  Patient presents with   Rectal Bleeding   Failure To Thrive    Cassandra Andrade is a 56 y.o. female.  HPI 56 year old female presents with a chief complaint of blood in diaper.  History is from mom over the phone.  Since being discharged about 3 weeks ago, the patient has been having more staring than his typical for her.  Seem to not be as alert.  She is also not eating as much.  She is nonverbal at baseline.  This morning a lot of her meds came out the side of her mouth, which sometimes patient does to not swallow.  When mom changed her diaper there seem to be a fair amount of blood that seem like when someone was starting their period, but she has not had one in quite some time.  She has multiple comorbidities which include history of DVT on warfarin, BKA on the right, seizure disorder, and epilepsy.  Home Medications Prior to Admission medications   Medication Sig Start Date End Date Taking? Authorizing Provider  acetaminophen (TYLENOL) 650 MG CR tablet Take 650 mg by mouth every 8 (eight) hours as needed for pain.   Yes [provider]  Ascorbic Acid (VITAMIN C) 1000 MG tablet Take 1,000 mg by mouth daily. 08/17/11  Yes [provider]  BRIVIACT 100 MG TABS tablet Take 100 mg by mouth 2 (two) times daily. 07/09/21  Yes [provider]  clonazePAM (KLONOPIN) 1 MG tablet One tablet twice daily as needed for seizure. Patient taking differently: Take 1 mg by mouth 2 (two) times daily as needed (seizures). One tablet twice daily as needed for seizure. 04/24/21  Yes Camara, Maryan Puls, MD  ketoconazole (NIZORAL) 2 % shampoo Apply 1 application topically 2 (two) times a week. Monday,wednesday 02/05/21  Yes [provider]  lacosamide (VIMPAT) 200 MG TABS tablet Take 1 tablet (200 mg total) by mouth 2 (two) times  daily. 04/20/21  Yes Alric Ran, MD  lamoTRIgine (LAMICTAL) 100 MG tablet Take 3 tablets (300 mg total) by mouth 2 (two) times daily along with 50 mg 2 (two) times daily Total of 350 mg twice daily (300 mg+ 50 mg) Patient taking differently: Take 300 mg by mouth See admin instructions. Take 3 tablets (300 mg total) by mouth 2 (two) times daily along with 50 mg 2 (two) times daily Total of 350 mg twice daily (300 mg+ 50 mg) 04/20/21  Yes Alric Ran, MD  lamoTRIgine (LAMICTAL) 25 MG tablet Take 2 tablets (50 mg total) by mouth 2 (two) times daily along with 300 mg 2 (two) times daily. Total of 350 mg twice daily (300 mg+ 50 mg) Patient taking differently: Take 50 mg by mouth See admin instructions. Take 2 tablets (50 mg total) by mouth 2 (two) times daily along with 300 mg 2 (two) times daily. Total of 350 mg twice daily (300 mg+ 50 mg) 04/20/21  Yes Camara, Amadou, MD  levothyroxine (SYNTHROID, LEVOTHROID) 50 MCG tablet Take 50 mcg by mouth every evening.   Yes [provider]  OLANZapine zydis (ZYPREXA) 5 MG disintegrating tablet Take 5 mg by mouth 2 (two) times daily. 07/09/21  Yes [provider]  omeprazole (PRILOSEC) 20 MG capsule Take 20 mg by mouth at bedtime. 06/12/19  Yes [provider]  promethazine (PHENERGAN) 25 MG tablet Take 25  mg by mouth every 8 (eight) hours as needed for nausea or vomiting. 04/27/19  Yes [provider]  venlafaxine XR (EFFEXOR-XR) 150 MG 24 hr capsule Take 1 capsule (150 mg total) by mouth daily with breakfast. 07/07/21  Yes Arfeen, Arlyce Harman, MD  warfarin (COUMADIN) 5 MG tablet Take 1.5-2 tablets (7.5-10 mg total) by mouth See admin instructions. Pt takes one and a half tablets (7.'5mg'$  total) on Sunday and Wednesday and 2 tablets ('10mg'$  total) all other days. Patient taking differently: Take 7.5-10 mg by mouth See admin instructions. 7.'5mg'$  on Sundays and '10mg'$  on all other days or as directed. 01/03/18  Yes Thurnell Lose, MD   levofloxacin (LEVAQUIN) 250 MG tablet Take 1 tablet (250 mg total) by mouth daily. Patient not taking: Reported on 08/11/2021 07/21/21   Patrecia Pour, MD      Allergies    Vicodin [hydrocodone-acetaminophen], Morphine, and Morphine and related    Review of Systems   Review of Systems  Unable to perform ROS: Patient nonverbal    Physical Exam Updated Vital Signs BP 104/68   Pulse (!) 107   Temp 99.3 F (37.4 C) (Rectal)   Resp 20   SpO2 99%  Physical Exam Vitals and nursing note reviewed. Exam conducted with a chaperone present.  Constitutional:      Appearance: She is well-developed.  HENT:     Head: Normocephalic and atraumatic.  Cardiovascular:     Rate and Rhythm: Regular rhythm. Tachycardia present.     Heart sounds: Normal heart sounds.  Pulmonary:     Effort: Pulmonary effort is normal.     Breath sounds: Normal breath sounds.  Abdominal:     General: There is no distension.     Palpations: Abdomen is soft.     Tenderness: There is no abdominal tenderness.     Comments: Hard to appreciate if there is tenderness  Genitourinary:   Skin:    General: Skin is warm and dry.  Neurological:     Mental Status: She is alert.     Comments: Patient's was to be awake but is not following my commands.  She seems to resist eye opening.  Both upper extremities are flexed at the elbows though not stiff and I am able to extend and range these without difficulty.     ED Results / Procedures / Treatments   Labs (all labs ordered are listed, but only abnormal results are displayed) Labs Reviewed  COMPREHENSIVE METABOLIC PANEL - Abnormal; Notable for the following components:      Result Value   Sodium 146 (*)    Glucose, Bld 113 (*)    Calcium 10.4 (*)    Albumin 3.1 (*)    AST 13 (*)    All other components within normal limits  CBC WITH DIFFERENTIAL/PLATELET - Abnormal; Notable for the following components:   RBC 3.39 (*)    Hemoglobin 9.0 (*)    HCT 29.8 (*)     All other components within normal limits  PROTIME-INR - Abnormal; Notable for the following components:   Prothrombin Time >90.0 (*)    INR >10.0 (*)    All other components within normal limits  APTT - Abnormal; Notable for the following components:   aPTT 179 (*)    All other components within normal limits  URINALYSIS, ROUTINE W REFLEX MICROSCOPIC - Abnormal; Notable for the following components:   Color, Urine RED (*)    APPearance TURBID (*)    Glucose,  UA   (*)    Value: TEST NOT REPORTED DUE TO COLOR INTERFERENCE OF URINE PIGMENT   Hgb urine dipstick   (*)    Value: TEST NOT REPORTED DUE TO COLOR INTERFERENCE OF URINE PIGMENT   Bilirubin Urine   (*)    Value: TEST NOT REPORTED DUE TO COLOR INTERFERENCE OF URINE PIGMENT   Ketones, ur   (*)    Value: TEST NOT REPORTED DUE TO COLOR INTERFERENCE OF URINE PIGMENT   Protein, ur   (*)    Value: TEST NOT REPORTED DUE TO COLOR INTERFERENCE OF URINE PIGMENT   Nitrite   (*)    Value: TEST NOT REPORTED DUE TO COLOR INTERFERENCE OF URINE PIGMENT   Leukocytes,Ua   (*)    Value: TEST NOT REPORTED DUE TO COLOR INTERFERENCE OF URINE PIGMENT   All other components within normal limits  AMMONIA - Abnormal; Notable for the following components:   Ammonia 37 (*)    All other components within normal limits  URINALYSIS, MICROSCOPIC (REFLEX) - Abnormal; Notable for the following components:   Bacteria, UA RARE (*)    All other components within normal limits  HEMOGLOBIN AND HEMATOCRIT, BLOOD - Abnormal; Notable for the following components:   Hemoglobin 8.6 (*)    HCT 27.9 (*)    All other components within normal limits  I-STAT CHEM 8, ED - Abnormal; Notable for the following components:   Glucose, Bld 109 (*)    Hemoglobin 10.2 (*)    HCT 30.0 (*)    All other components within normal limits  CBG MONITORING, ED - Abnormal; Notable for the following components:   Glucose-Capillary 110 (*)    All other components within normal limits   I-STAT VENOUS BLOOD GAS, ED - Abnormal; Notable for the following components:   pO2, Ven 48 (*)    Bicarbonate 30.6 (*)    Acid-Base Excess 5.0 (*)    HCT 30.0 (*)    Hemoglobin 10.2 (*)    All other components within normal limits  RESP PANEL BY RT-PCR (FLU A&B, COVID) ARPGX2  CULTURE, BLOOD (ROUTINE X 2)  CULTURE, BLOOD (ROUTINE X 2)  URINE CULTURE  LACTIC ACID, PLASMA  TSH  C-REACTIVE PROTEIN  TYPE AND SCREEN    EKG EKG Interpretation  Date/Time:  Tuesday August 11 2021 08:59:23 EDT Ventricular Rate:  115 PR Interval:  208 QRS Duration: 81 QT Interval:  309 QTC Calculation: 428 R Axis:   27 Text Interpretation: Sinus tachycardia Prolonged PR interval Abnormal R-wave progression, early transition Borderline T abnormalities, anterior leads  similar to May 2023 Confirmed by Sherwood Gambler [phone]) on 08/11/2021 9:15:14 AM  Radiology US Pelvis Complete  Result Date: 08/11/2021 CLINICAL DATA:  Postmenopausal bleeding. EXAM: TRANSABDOMINAL ULTRASOUND OF PELVIS TECHNIQUE: Transabdominal ultrasound examination of the pelvis was performed including evaluation of the uterus, ovaries, adnexal regions, and pelvic cul-de-sac. COMPARISON:  CT AP 08/11/21. FINDINGS: Uterus Sub optimal evaluation. Ultrasound technologist reports patient exhibited altered mental status and was unable to be appropriately position for diagnostic study. Endometrium Thickness: Only a small portion of the endometrium was visualized which measured 7 mm in thickness. Right ovary Not visualized. Left ovary Not visualized. Other findings:  No abnormal free fluid. IMPRESSION: 1. This study is essentially nondiagnostic. Sonographer reports patient exhibits altered mental status and was unable to be appropriately position for a diagnostic study. 2. A small portion of the endometrium was visualized measuring 7 mm.) This is considered abnormal in a postmenopausal female.  When the patient's clinical condition improves and is able  to be positioned appropriately for a diagnostic ultrasound of the pelvis, repeat examination is recommended. Electronically Signed   By: Kerby Moors M.D.   On: 08/11/2021 14:38   CT Head Wo Contrast  Result Date: 08/11/2021 CLINICAL DATA:  Mental status changes of unknown cause in a 56 year old female. EXAM: CT HEAD WITHOUT CONTRAST TECHNIQUE: Contiguous axial images were obtained from the base of the skull through the vertex without intravenous contrast. RADIATION DOSE REDUCTION: This exam was performed according to the departmental dose-optimization program which includes automated exposure control, adjustment of the mA and/or kV according to patient size and/or use of iterative reconstruction technique. COMPARISON:  Multiple prior studies most recent of Jul 19, 2021. FINDINGS: Brain: No evidence of acute infarction, hemorrhage, hydrocephalus, extra-axial collection or mass lesion/mass effect. 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 Vascular: No hyperdense vessel or unexpected calcification. Skull: Signs of RIGHT frontotemporal craniotomy with similar appearance. No acute findings. Sinuses/Orbits: Unremarkable to the extent evaluated. Other: None. IMPRESSION: 1. No acute intracranial abnormality. 2. 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 Electronically Signed   By: Zetta Bills M.D.   On: 08/11/2021 10:53   CT ABDOMEN PELVIS W CONTRAST  Result Date: 08/11/2021 CLINICAL DATA:  Sepsis EXAM: CT ABDOMEN AND PELVIS WITH CONTRAST TECHNIQUE: Multidetector CT imaging of the abdomen and pelvis was performed using the standard protocol following bolus administration of intravenous contrast. RADIATION DOSE REDUCTION: This exam was performed according to the departmental dose-optimization program which includes automated exposure control, adjustment of the mA and/or kV according to patient size  and/or use of iterative reconstruction technique. CONTRAST:  173m OMNIPAQUE IOHEXOL 300 MG/ML  SOLN COMPARISON:  06/10/2021 FINDINGS: Streak artifact from patient's arms. Lower chest: No acute abnormality. Hepatobiliary: No focal liver abnormality. Several small gallstones are again identified. No biliary dilatation. Pancreas: Unremarkable. Spleen: Unremarkable. Adrenals/Urinary Tract: Adrenals are unremarkable. Kidneys and bladder are unremarkable. Stomach/Bowel: Stomach is within normal limits. Bowel is normal in caliber. Vascular/Lymphatic: IVC filter is present.  No adenopathy. Reproductive: Uterus and bilateral adnexa are unremarkable. Other: No free fluid.  No acute abnormality of the abdominal wall. Musculoskeletal: No acute osseous abnormality. IMPRESSION: No acute process. Cholelithiasis. Electronically Signed   By: PMacy MisM.D.   On: 08/11/2021 10:48   DG Chest Port 1 View  Result Date: 08/11/2021 CLINICAL DATA:  Possible sepsis EXAM: PORTABLE CHEST 1 VIEW COMPARISON:  07/14/2021 FINDINGS: Patient is rotated. Persistent mild elevation the right hemidiaphragm. No new consolidation or edema. No pleural effusion. Stable cardiomediastinal contours. Stimulator generator overlies the left hilar region with lead extending superiorly. IMPRESSION: No acute process in the chest. Electronically Signed   By: PMacy MisM.D.   On: 08/11/2021 09:19    Procedures Procedures    Medications Ordered in ED Medications  sodium chloride flush (NS) 0.9 % injection 3 mL (3 mLs Intravenous Given 08/11/21 1232)  acetaminophen (TYLENOL) tablet 650 mg (has no administration in time range)    Or  acetaminophen (TYLENOL) suppository 650 mg (has no administration in time range)  albuterol (PROVENTIL) (2.5 MG/3ML) 0.083% nebulizer solution 2.5 mg (has no administration in time range)  OLANZapine zydis (ZYPREXA) disintegrating tablet 5 mg (has no administration in time range)  venlafaxine XR (EFFEXOR-XR) 24  hr capsule 150 mg (has no administration in time range)  pantoprazole (PROTONIX) EC tablet 40 mg (has no administration  in time range)  levothyroxine (SYNTHROID) tablet 50 mcg (has no administration in time range)  brivaracetam (BRIVIACT) tablet 100 mg (has no administration in time range)  lacosamide (VIMPAT) tablet 200 mg (has no administration in time range)  clonazePAM (KLONOPIN) tablet 1 mg (has no administration in time range)  lamoTRIgine (LAMICTAL) tablet 350 mg (has no administration in time range)  acetaminophen (TYLENOL) suppository 650 mg (650 mg Rectal Given 08/11/21 1001)  lactated ringers bolus 1,000 mL (0 mLs Intravenous Stopped 08/11/21 1133)  iohexol (OMNIPAQUE) 300 MG/ML solution 100 mL (100 mLs Intravenous Contrast Given 08/11/21 1038)  phytonadione (VITAMIN K) 2.5 mg in dextrose 5 % 50 mL IVPB (0 mg Intravenous Stopped 08/11/21 1334)    ED Course/ Medical Decision Making/ A&P                           Medical Decision Making Amount and/or Complexity of Data Reviewed Labs: ordered. Radiology: ordered. ECG/medicine tests: ordered.  Risk OTC drugs. Prescription drug management. Decision regarding hospitalization.   Patient is found to have supra therapeutic INR, which is likely causing her bleeding.  On exam there is some residual blood but no active bleeding.  Nurse tells me that when she put in a catheter to get urine, it was mostly blood.  CT images were obtained and I personally viewed these images but there is no obvious cause of the bleeding.  We will get a pelvic ultrasound after discussion with hospitalist.  With her INR over 10, she will need vitamin K.  She does have abnormal mental status for her but her head CT shows no head bleed and I personally viewed/interpret these images as well.  Thus I do not think she needs Feiba-Kcentra but we will start with vitamin K and holding her warfarin.  Hemoglobin is 9.0, similar to 5/28.  She was tachycardic and had a  low-grade temperature of 99.3 but no obvious source of an infection.  Hospitalist will admit.  I updated mom who indicates that she has been giving the warfarin as prescribed.  Stable for admission.        Final Clinical Impression(s) / ED Diagnoses Final diagnoses:  Supratherapeutic INR    Rx / DC Orders ED Discharge Orders     None         Sherwood Gambler, MD 08/11/21 1536

## 2021-08-11 NOTE — ED Notes (Signed)
Hospitalist at bedside 

## 2021-08-11 NOTE — Progress Notes (Signed)
SLP Cancellation Note  Patient Details Name: Cassandra Andrade MRN: [id number] DOB: 26-Feb-1965   Cancelled treatment:        Per ED RN pt is currently in ultrasound and didn't open eyes when initially arrived and has been intermittent since. SLP will attempt to see tomorrow.  Pt has chronic dysphagia and was on puree and thin liquids prior   Houston Siren 08/11/2021, 2:15 PM

## 2021-08-11 NOTE — ED Notes (Signed)
Moderate amount of bleeding noted from vagina and urethra while performing In and Out.  EDP made aware.

## 2021-08-11 NOTE — H&P (Addendum)
History and Physical    Patient: Cassandra Andrade JEH:[id number] DOB: 05/09/1965 DOA: 08/11/2021 DOS: the patient was seen and examined on 08/11/2021 PCP: Berkley Harvey, NP  Patient coming from: Home via EMS  Chief Complaint:  Chief Complaint  Patient presents with    Bleeding    Failure To Thrive   HPI: Cassandra Andrade is a 56 y.o. 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   history is obtained from the patient's mother who is her primary caregiver over the phone as the patient is nonverbal.  Blood was bright red with intermittent dark pieces present.  She didn't think it was coming from her rectum and though it may be coming from her vagina but was not totally clear.  To her knowledge the patient had not had a menstrual cycle in quite some time.  Mom notes that ever since she got home from last hospitalization she had not been eating very much.  She reports that she has been losing weight, but is not sure how much.  Patient has intermittently been refusing to take medications for pocketing them in her mouth when she does.  The patient had not recently had a bowel movement in the last couple of days.  She reports that she just stares at the wall.  She questions if symptoms has something to do with the medication that was started due to the patient being intermittently combative and hallucinating.  Patient had just recently been hospitalized from 5/23-5/29 after presenting with acute acute metabolic encephalopathy thought secondary to urinary tract infection due to Pseudomonas and Enterococcus treated with Levaquin after talks with ID.  On admission to the emergency  department patient was seen to have temperature 99.7 F, pulse 10 7-119, respirations 11-24, and all other vital signs maintained.  CT scan of the head did not note any acute abnormality with extensive encephalomalacia related to prior right craniotomy.  Labs significant for hemoglobin 9, sodium 146, calcium 10.4, lactic acid 1.2, and INR greater than 10.  Urinalysis noted red urine appearance with rare bacteria, and greater than 50 RBCs.  CT scan of the abdomen and pelvis did not note any acute abnormality and cholelithiasis.  Urine culture was also collected.  Patient had been given 2.5 mg of vitamin K, Tylenol 650 mg suppository, and 1 L of lactated Ringer's.   Review of Systems: unable to review all systems due to the inability of the patient to answer questions. Past Medical History:  Diagnosis Date   Anxiety    Arthritis    Cancer ([place])    Osteosarcoma, right leg   CVA (cerebrovascular accident due to intracerebral hemorrhage) ([place]) 2004   Depression    DVT (deep venous thrombosis) ([facility])    Left leg   MRSA (methicillin resistant Staphylococcus aureus)    S/P BKA (below knee amputation) unilateral ([place])    Right    Seizures ([place])    Thyroid disease    Unspecified epilepsy with intractable epilepsy 02/28/2013   Past Surgical History:  Procedure Laterality Date   ABOVE KNEE LEG AMPUTATION Right    Osteosarcoma   BRAIN SURGERY  2004   cerebral aneurysm   FRACTURE SURGERY     Rt. femur, folllowed by MRSA  TOTAL KNEE ARTHROPLASTY Right    Social History:  reports that she has never smoked. She has never used smokeless tobacco. She reports that she does not drink alcohol and does not use drugs.  Allergies  Allergen Reactions   Vicodin [Hydrocodone-Acetaminophen] Nausea And Vomiting   Morphine Rash   Morphine And Related Rash    Allergic reaction only in iv form    Family History  Problem Relation Age of Onset   Anxiety disorder Mother    Diabetes Father    Seizures Neg Hx      Prior to Admission medications   Medication Sig Start Date End Date Taking? Authorizing Provider  acetaminophen (TYLENOL) 650 MG CR tablet Take 650 mg by mouth every 8 (eight) hours as needed for pain. Patient not taking: Reported on 07/14/2021    [provider]  Ascorbic Acid (VITAMIN C) 1000 MG tablet Take 1,000 mg by mouth daily. 08/17/11   [provider]  BRIVIACT 100 MG TABS tablet Take 100 mg by mouth 2 (two) times daily. 07/09/21   [provider]  clonazePAM (KLONOPIN) 1 MG tablet One tablet twice daily as needed for seizure. Patient taking differently: Take 1 mg by mouth 2 (two) times daily as needed (seizures). One tablet twice daily as needed for seizure. 04/24/21   Alric Ran, MD  Emollient (GOLD BOND DIABETICS DRY SKIN) CREA Apply 1 application. topically as needed (skin problems).    [provider]  ketoconazole (NIZORAL) 2 % shampoo Apply 1 application topically 2 (two) times a week. Monday,wednesday 02/05/21   [provider]  lacosamide (VIMPAT) 200 MG TABS tablet Take 1 tablet (200 mg total) by mouth 2 (two) times daily. 04/20/21   Alric Ran, MD  lamoTRIgine (LAMICTAL) 100 MG tablet Take 3 tablets (300 mg total) by mouth 2 (two) times daily along with 50 mg 2 (two) times daily Total of 350 mg twice daily (300 mg+ 50 mg) Patient taking differently: Take 300 mg by mouth See admin instructions. Take 3 tablets (300 mg total) by mouth 2 (two) times daily along with 50 mg 2 (two) times daily Total of 350 mg twice daily (300 mg+ 50 mg) 04/20/21   Alric Ran, MD  lamoTRIgine (LAMICTAL) 25 MG tablet Take 2 tablets (50 mg total) by mouth 2 (two) times daily along with 300 mg 2 (two) times daily. Total of 350 mg twice daily (300 mg+ 50 mg) Patient taking differently: Take 50 mg by mouth See admin instructions. Take 2 tablets (50 mg total) by mouth 2 (two) times daily along with 300 mg 2 (two) times daily. Total of 350 mg twice daily  (300 mg+ 50 mg) 04/20/21   Alric Ran, MD  levofloxacin (LEVAQUIN) 250 MG tablet Take 1 tablet (250 mg total) by mouth daily. 07/21/21   Patrecia Pour, MD  levothyroxine (SYNTHROID, LEVOTHROID) 50 MCG tablet Take 50 mcg by mouth every evening.    [provider]  omeprazole (PRILOSEC) 20 MG capsule Take 20 mg by mouth at bedtime. 06/12/19   [provider]  Prenatal Vit-Fe Fumarate-FA (PRENATAL MULTIVITAMIN) TABS tablet Take 1 tablet by mouth daily. Patient not taking: Reported on 07/14/2021    [provider]  promethazine (PHENERGAN) 25 MG tablet Take 25 mg by mouth every 8 (eight) hours as needed for nausea or vomiting. 04/27/19   [provider]  venlafaxine XR (EFFEXOR-XR) 150 MG 24 hr capsule Take 1 capsule (150 mg total) by mouth daily with breakfast. 07/07/21  Kathlee Nations, MD  warfarin (COUMADIN) 5 MG tablet Take 1.5-2 tablets (7.5-10 mg total) by mouth See admin instructions. Pt takes one and a half tablets (7.'5mg'$  total) on Sunday and Wednesday and 2 tablets ('10mg'$  total) all other days. Patient taking differently: Take 7.5-10 mg by mouth See admin instructions. 7.'5mg'$  on Sundays and '10mg'$  on all other days or as directed. 01/03/18   Thurnell Lose, MD    Physical Exam: Vitals:   08/11/21 1045 08/11/21 1100 08/11/21 1130 08/11/21 1200  BP: 108/64 110/69 112/78 114/74  Pulse: (!) 107 (!) 111 (!) 117 (!) 115  Resp: (!) '23 18 20 '$ (!) 21  Temp:      TempSrc:      SpO2: 100% 100% 100% 100%   Exam  Constitutional: Middle-age female who appears to be altered and not responding to verbal commands at this time. Eyes: Pupils appear to be equal.  Lids and conjunctivae normal ENMT: Mucous membranes are dry. Posterior pharynx clear of any exudate or lesions.  Neck: normal, supple, no masses, no thyromegaly Respiratory: clear to auscultation bilaterally, no wheezing, no crackles.   Cardiovascular: Tachycardic, no murmurs / rubs / gallops.  Swelling present  of the left upper extremity. Abdomen: no tenderness, no masses palpated. No hepatosplenomegaly. Bowel sounds positive.  Musculoskeletal: Contractures present Skin: no rashes, lesions, ulcers. No induration Neurologic: CN 2-12 grossly intact.  Functional quadriplegia Psychiatric:  Alert, but unable to assess for orientation.  Data Reviewed:    Assessment and Plan: Bleeding secondary to supratherapeutic INR Acute.  Reported to have bleeding for which is not totally clear if it is coming from her bladder or her vagina.  Hemoglobin 9 g/dL which appears similar to previous.  INR greater than 10.  Patient has been off Levaquin for at least a couple weeks now which should not be the cause.  Patient had been given vitamin K 2.5 IV x1 dose.  CT scan of the abdomen and pelvis with contrast did not note a clear sign of bleeding -Admit to a telemetry bed -Type and screen for possible need of blood products -Follow-up vaginal ultrasound -Check PT/INR daily -Coumadin per pharmacy -Serial monitoring of H&H -Insert Foley catheter to try and delineate source of bleeding -Consider need of formally consulting OB/GYN or urology if symptoms persist after correcting INR goal range.  Acute metabolic encephalopathy History of ruptured brain aneurysm with TBI and functional quadriplegia Patient has not reported to be very verbal at baseline, but previously able to follow commands.  CT scan of the head without any acute changes appreciated.  Ammonia was just mildly elevated at 37, but not thought to be significant enough to cause patient's symptoms.  At this time patient not readily following commands at this time.  No clear source of infection appreciated. -Neurochecks  -Check EEG -Check CRP  Hematuria Urinalysis revealed red urine color with rare bacteria, greater than 50 RBCs per hpf, but no significant WBCs seen.  Similar appearance to urine except for many bacteria seen during hospitalization on 5/23 where  patient was positive for Pseudomonas and Enterococcus.  She had been treated with Levaquin at discharge and completed course. -Follow-up urine culture  Dysphagia Chronic.  Patient currently not readily following commands.  Had previously been evaluated with speech and on a dysphagia 1 diet. -Aspiration precautions -Elevate head of the bed -Speech therapy consulted to evaluate for swallowing -Patient's mother does make note of possibly wanting G-tube placement  History of seizure disorder -Seizure precaution -Continue  Vimpat, Lamictal, Briviact, and clonazepam prn  Hypothyroidism TSH last 1.252 on 4/19. -Check TSH -Continue levothyroxine  History of DVT Lupus anticoagulant disorder -Continue to monitor INR daily -Coumadin per pharmacy  GERD -Continue Normocytic substitution of omeprazole  DVT prophylaxis: Coumadin Advance Care Planning:   Code Status: Full Code   Consults: None  Family Communication: Mother updated over the phone.  Severity of Illness: The appropriate patient status for this patient is OBSERVATION. Observation status is judged to be reasonable and necessary in order to provide the required intensity of service to ensure the patient's safety. The patient's presenting symptoms, physical exam findings, and initial radiographic and laboratory data in the context of their medical condition is felt to place them at decreased risk for further clinical deterioration. Furthermore, it is anticipated that the patient will be medically stable for discharge from the hospital within 2 midnights of admission.   Author: Norval Morton, MD 08/11/2021 12:13 PM  For on call review [email or web address].

## 2021-08-11 NOTE — ED Notes (Signed)
Received verbal report from Shuronia P RN at this time 

## 2021-08-11 NOTE — ED Notes (Signed)
Phlebotomy at bedside attempting to get remaining lab work.

## 2021-08-11 NOTE — ED Notes (Signed)
Patient transported to Ultrasound 

## 2021-08-11 NOTE — Consult Note (Signed)
      OBSTETRICS AND GYNECOLOGY ATTENDING TELEPHONE CONSULT NOTE   Consult Date: 08/11/2021   Reason for Consult:  Perineal bleeding in the setting of INR >10, 7 mm endometrial stripe seen on ultrasound Consulting Provider: Dr. Fuller Plan   Consultation Details (from provider and chart review):  Cassandra Andrade is a 56 y.o. F at  [employer] Room with medical history significant of brain aneurysm rupture and intracranial hemorrhage s/p ventricular shunt, seizure disorder, DVT and lupus anticoagulant on chronic anticoagulation, right leg osteosarcoma s/p amputation, chronic dysphagia on pured diet, functional quadriplegia, recent metabolic encephalopathy and bedbound  who presented after mother changed her pull up this morning and blood was present.  It is unsure if bleeding was coming from vaginal or bladder, but they are sure it is not rectal.  Patient is non-verbal, history obtained from her mother who is her caregiver.   The provider presented the following relevant clinical information: Stable hemoglobin of 9 but INR was noted to be >10.   CT scan of the abdomen and pelvis with contrast did not note a clear sign of bleeding. Pelvic ultrasound was unremarkable except for endometrial strip e of 7 mm  The provider had a clinical question about if any intervention is needed for 7 mm endometrial stripe.   I performed a chart review on the patient and reviewed available documentation.  BP 106/71   Pulse 100   Temp 99.3 F (37.4 C) (Rectal)   Resp (!) 22   SpO2 99%   Exam performed by consulting provider and remarkable for blood coming from perineum area.  Pertinent labs and imaging: as above.  Recommendations: - Correction of supratherapeutic INR is required as this is the root cause of any bleeding in this situation.  Of note patient has received Vitamin K 2.5 IV x1 dose. - Foley catheter can be placed into bladder, and tampon in the vagina. This can  help delineate area of bleeding. - Once INR is corrected, and bleeding is of GYN etiology, we can be re-consulted for further recommendations. - 7 mm is only minimally elevated for a postmenopausal patient, but does need further evaluation once patient is stabilized. This evaluation is not emergent and can be outpatient.  No acute GYN surgical intervention is indicated for now.  Usually we recommend oral progestins for initial management abnormal uterine bleeding.  Thank you for this telephone consult.  If additional recommendations are needed, please call [phone] [facility] OB/GYN Consult Attending Monday-Friday 8am - 5pm) or [phone] [facility] OB[facility]N Attending On Call all day, every day).    I spent approximately 3 minutes directly consulting with the provider and verbally discussing this case. Additional 20 minutes was spent performing chart review and documentation.    Verita Schneiders, MD [place] for [employer], [place]

## 2021-08-11 NOTE — ED Notes (Signed)
[name] (Mother) would like to be called and updated on pt status. Phone is [phone].

## 2021-08-11 NOTE — Progress Notes (Signed)
[place] for warfarin Indication: DVT, lupus anticoagulant   Assessment: 7 yof with hx brain aneurysm rupture and ICH s/p VP shunt, functional quadriplegia/bedbound, chronic dysphagia on pureed diet, seizure disorder, DVT, lupus anticoagulant on chronic warfarin presenting with blood in diaper, unclear source. INR >10 on presentation. Noted recently on Levaquin for infection but off for several weeks now PTA. Pharmacy consulted to dose warfarin inpatient. Hg 10.2, plt wnl. S/p vitamin K IV 2.'5mg'$  IV x 1 on 6/20.  PTA warfarin dose: '10mg'$  daily except 7.'5mg'$  on Sun (last dose 6/18 PTA)  Goal of Therapy:  INR 2-3 Monitor platelets by anticoagulation protocol: Yes   Plan:  Hold warfarin for now Daily INR Monitor CBC, s/sx bleeding  Arturo Morton, PharmD, BCPS Clinical Pharmacist 08/11/2021 2:09 PM

## 2021-08-11 NOTE — ED Triage Notes (Signed)
Per EMS, Pt, from home, presents after mother found a minimal amount of bright red blood in her diaper.  Mother compared it to "period blood."  Pt has not had a period in a long time per mother.  Also, Pt has been less active x1 week and refused medications this morning.    Pt is non-verbal at baseline.

## 2021-08-12 ENCOUNTER — Inpatient Hospital Stay (HOSPITAL_COMMUNITY): Payer: Medicare Other

## 2021-08-12 ENCOUNTER — Observation Stay (HOSPITAL_COMMUNITY): Payer: Medicare Other

## 2021-08-12 DIAGNOSIS — R569 Unspecified convulsions: Secondary | ICD-10-CM

## 2021-08-12 DIAGNOSIS — D6862 Lupus anticoagulant syndrome: Secondary | ICD-10-CM | POA: Diagnosis present

## 2021-08-12 DIAGNOSIS — R532 Functional quadriplegia: Secondary | ICD-10-CM | POA: Diagnosis present

## 2021-08-12 DIAGNOSIS — E039 Hypothyroidism, unspecified: Secondary | ICD-10-CM | POA: Diagnosis present

## 2021-08-12 DIAGNOSIS — G9341 Metabolic encephalopathy: Secondary | ICD-10-CM | POA: Diagnosis present

## 2021-08-12 DIAGNOSIS — D539 Nutritional anemia, unspecified: Secondary | ICD-10-CM | POA: Diagnosis present

## 2021-08-12 DIAGNOSIS — D6832 Hemorrhagic disorder due to extrinsic circulating anticoagulants: Secondary | ICD-10-CM | POA: Diagnosis present

## 2021-08-12 DIAGNOSIS — Z885 Allergy status to narcotic agent status: Secondary | ICD-10-CM | POA: Diagnosis not present

## 2021-08-12 DIAGNOSIS — Z982 Presence of cerebrospinal fluid drainage device: Secondary | ICD-10-CM | POA: Diagnosis not present

## 2021-08-12 DIAGNOSIS — R58 Hemorrhage, not elsewhere classified: Secondary | ICD-10-CM | POA: Diagnosis present

## 2021-08-12 DIAGNOSIS — R627 Adult failure to thrive: Secondary | ICD-10-CM | POA: Diagnosis present

## 2021-08-12 DIAGNOSIS — R1310 Dysphagia, unspecified: Secondary | ICD-10-CM | POA: Diagnosis present

## 2021-08-12 DIAGNOSIS — G40909 Epilepsy, unspecified, not intractable, without status epilepticus: Secondary | ICD-10-CM | POA: Diagnosis present

## 2021-08-12 DIAGNOSIS — Z86718 Personal history of other venous thrombosis and embolism: Secondary | ICD-10-CM | POA: Diagnosis not present

## 2021-08-12 DIAGNOSIS — L89152 Pressure ulcer of sacral region, stage 2: Secondary | ICD-10-CM | POA: Diagnosis present

## 2021-08-12 DIAGNOSIS — T45515A Adverse effect of anticoagulants, initial encounter: Secondary | ICD-10-CM | POA: Diagnosis present

## 2021-08-12 DIAGNOSIS — E876 Hypokalemia: Secondary | ICD-10-CM | POA: Diagnosis present

## 2021-08-12 DIAGNOSIS — F32A Depression, unspecified: Secondary | ICD-10-CM | POA: Diagnosis present

## 2021-08-12 DIAGNOSIS — Z7901 Long term (current) use of anticoagulants: Secondary | ICD-10-CM | POA: Diagnosis not present

## 2021-08-12 DIAGNOSIS — D62 Acute posthemorrhagic anemia: Secondary | ICD-10-CM | POA: Diagnosis not present

## 2021-08-12 DIAGNOSIS — R509 Fever, unspecified: Secondary | ICD-10-CM | POA: Diagnosis not present

## 2021-08-12 DIAGNOSIS — R000 Tachycardia, unspecified: Secondary | ICD-10-CM | POA: Diagnosis not present

## 2021-08-12 DIAGNOSIS — Z20822 Contact with and (suspected) exposure to covid-19: Secondary | ICD-10-CM | POA: Diagnosis present

## 2021-08-12 DIAGNOSIS — Z888 Allergy status to other drugs, medicaments and biological substances status: Secondary | ICD-10-CM | POA: Diagnosis not present

## 2021-08-12 DIAGNOSIS — R310 Gross hematuria: Secondary | ICD-10-CM | POA: Diagnosis present

## 2021-08-12 DIAGNOSIS — Z931 Gastrostomy status: Secondary | ICD-10-CM | POA: Diagnosis not present

## 2021-08-12 LAB — BASIC METABOLIC PANEL
Anion gap: 12 (ref 5–15)
BUN: 14 mg/dL (ref 6–20)
CO2: 28 mmol/L (ref 22–32)
Calcium: 9.7 mg/dL (ref 8.9–10.3)
Chloride: 104 mmol/L (ref 98–111)
Creatinine, Ser: 0.77 mg/dL (ref 0.44–1.00)
GFR, Estimated: >60 mL/min (ref >60)
Glucose, Bld: 101 mg/dL — ABNORMAL HIGH (ref 70–99)
Potassium: 3.6 mmol/L (ref 3.5–5.1)
Sodium: 144 mmol/L (ref 135–145)

## 2021-08-12 LAB — CBC
HCT: 27.3 % — ABNORMAL LOW (ref 36.0–46.0)
Hemoglobin: 8.5 g/dL — ABNORMAL LOW (ref 12.0–15.0)
MCH: 27.1 pg (ref 26.0–34.0)
MCHC: 31.1 g/dL (ref 30.0–36.0)
MCV: 86.9 fL (ref 80.0–100.0)
Platelets: 299 10*3/uL (ref 150–400)
RBC: 3.14 MIL/uL — ABNORMAL LOW (ref 3.87–5.11)
RDW: 15.5 % (ref 11.5–15.5)
WBC: 7.8 10*3/uL (ref 4.0–10.5)
nRBC: 0 % (ref 0.0–0.2)

## 2021-08-12 LAB — CBC WITH DIFFERENTIAL/PLATELET
Abs Immature Granulocytes: 0.06 10*3/uL (ref 0.00–0.07)
Basophils Absolute: 0 10*3/uL (ref 0.0–0.1)
Basophils Relative: 0 %
Eosinophils Absolute: 0.1 10*3/uL (ref 0.0–0.5)
Eosinophils Relative: 2 %
HCT: 34 % — ABNORMAL LOW (ref 36.0–46.0)
Hemoglobin: 10.2 g/dL — ABNORMAL LOW (ref 12.0–15.0)
Immature Granulocytes: 1 %
Lymphocytes Relative: 31 %
Lymphs Abs: 2.1 10*3/uL (ref 0.7–4.0)
MCH: 26.2 pg (ref 26.0–34.0)
MCHC: 30 g/dL (ref 30.0–36.0)
MCV: 87.2 fL (ref 80.0–100.0)
Monocytes Absolute: 0.5 10*3/uL (ref 0.1–1.0)
Monocytes Relative: 7 %
Neutro Abs: 4 10*3/uL (ref 1.7–7.7)
Neutrophils Relative %: 59 %
Platelets: 245 10*3/uL (ref 150–400)
RBC: 3.9 MIL/uL (ref 3.87–5.11)
RDW: 15.5 % (ref 11.5–15.5)
WBC: 6.8 10*3/uL (ref 4.0–10.5)
nRBC: 0 % (ref 0.0–0.2)

## 2021-08-12 LAB — PROTIME-INR
INR: 1.5 — ABNORMAL HIGH (ref 0.8–1.2)
Prothrombin Time: 18.2 seconds — ABNORMAL HIGH (ref 11.4–15.2)

## 2021-08-12 LAB — C-REACTIVE PROTEIN: CRP: 23.9 mg/dL — ABNORMAL HIGH (ref <1.0)

## 2021-08-12 LAB — TSH: TSH: 2.983 u[IU]/mL (ref 0.350–4.500)

## 2021-08-12 MED ORDER — CHLORHEXIDINE GLUCONATE CLOTH 2 % EX PADS
6.0000 | MEDICATED_PAD | Freq: Every day | CUTANEOUS | Status: DC
  Administered 2021-08-12 – 2021-08-25 (×14): 6 via TOPICAL

## 2021-08-12 MED ORDER — FOOD THICKENER (SIMPLYTHICK): 10.0000 | ORAL | Status: DC | PRN

## 2021-08-12 MED ORDER — SODIUM CHLORIDE 0.9 % IV SOLN: INTRAVENOUS | Status: DC

## 2021-08-12 MED ORDER — BRIVARACETAM 10 MG/ML PO SOLN
100.0000 mg | Freq: Two times a day (BID) | ORAL | Status: DC
  Filled 2021-08-12 (×2): qty 10

## 2021-08-12 MED ORDER — FENTANYL CITRATE PF 50 MCG/ML IJ SOSY
12.5000 ug | PREFILLED_SYRINGE | Freq: Once | INTRAMUSCULAR | Status: AC
  Administered 2021-08-12: 12.5 ug via INTRAVENOUS
  Filled 2021-08-12: qty 1

## 2021-08-12 NOTE — Consult Note (Addendum)
Urology Consult   Physician requesting consult: Dr. Verlon Au  Reason for consult: Hematuria  History of Present Illness: Cassandra Andrade is a 56 y.o. with a history of osteosarcoma, DVT, depression, and CVA with baseline neurologic deficits.  She was noted to have significant gross blood from her vagina/rectum/GU tract on admission.  A Foley catheter was placed and she was confirmed to have > 50 RBCs.  Her UA was not highly suspicious for a UTI but she did have a documented UTI a few weeks ago.  She is non-verbal at baseline and her history was obtained from her mother.  There is no know history of gross hematuria in the past.  She has no history of known urolithiasis, GU malignancy, GU trauma, or GU surgery.  CT imaging of the abdomen and pelvis on admission did not show any clear urologic abnormalities to suggest an etiology for her hematuria.  She is on Warfarin and has been therapeutic.   Past Medical History:  Diagnosis Date   Anxiety    Arthritis    Cancer ([place])    Osteosarcoma, right leg   CVA (cerebrovascular accident due to intracerebral hemorrhage) ([place]) 2004   Depression    DVT (deep venous thrombosis) ([facility])    Left leg   MRSA (methicillin resistant Staphylococcus aureus)    S/P BKA (below knee amputation) unilateral ([place])    Right    Seizures ([place])    Thyroid disease    Unspecified epilepsy with intractable epilepsy 02/28/2013    Past Surgical History:  Procedure Laterality Date   ABOVE KNEE LEG AMPUTATION Right    Osteosarcoma   BRAIN SURGERY  2004   cerebral aneurysm   FRACTURE SURGERY     Rt. femur, folllowed by MRSA   TOTAL KNEE ARTHROPLASTY Right      Current Hospital Medications:  Home meds:  No current facility-administered medications on file prior to encounter.   Current Outpatient Medications on File Prior to Encounter  Medication Sig Dispense Refill   acetaminophen (TYLENOL) 650 MG CR tablet Take 650 mg by mouth every 8 (eight) hours as needed for  pain.     Ascorbic Acid (VITAMIN C) 1000 MG tablet Take 1,000 mg by mouth daily.     BRIVIACT 100 MG TABS tablet Take 100 mg by mouth 2 (two) times daily.     clonazePAM (KLONOPIN) 1 MG tablet One tablet twice daily as needed for seizure. (Patient taking differently: Take 1 mg by mouth 2 (two) times daily as needed (seizures). One tablet twice daily as needed for seizure.) 30 tablet 5   ketoconazole (NIZORAL) 2 % shampoo Apply 1 application topically 2 (two) times a week. Monday,wednesday     lacosamide (VIMPAT) 200 MG TABS tablet Take 1 tablet (200 mg total) by mouth 2 (two) times daily. 180 tablet 5   lamoTRIgine (LAMICTAL) 100 MG tablet Take 3 tablets (300 mg total) by mouth 2 (two) times daily along with 50 mg 2 (two) times daily Total of 350 mg twice daily (300 mg+ 50 mg) (Patient taking differently: Take 300 mg by mouth See admin instructions. Take 3 tablets (300 mg total) by mouth 2 (two) times daily along with 50 mg 2 (two) times daily Total of 350 mg twice daily (300 mg+ 50 mg)) 180 tablet 5   lamoTRIgine (LAMICTAL) 25 MG tablet Take 2 tablets (50 mg total) by mouth 2 (two) times daily along with 300 mg 2 (two) times daily. Total of 350 mg twice daily (300 mg+  50 mg) (Patient taking differently: Take 50 mg by mouth See admin instructions. Take 2 tablets (50 mg total) by mouth 2 (two) times daily along with 300 mg 2 (two) times daily. Total of 350 mg twice daily (300 mg+ 50 mg)) 120 tablet 5   levothyroxine (SYNTHROID, LEVOTHROID) 50 MCG tablet Take 50 mcg by mouth every evening.     OLANZapine zydis (ZYPREXA) 5 MG disintegrating tablet Take 5 mg by mouth 2 (two) times daily.     omeprazole (PRILOSEC) 20 MG capsule Take 20 mg by mouth at bedtime.     promethazine (PHENERGAN) 25 MG tablet Take 25 mg by mouth every 8 (eight) hours as needed for nausea or vomiting.     venlafaxine XR (EFFEXOR-XR) 150 MG 24 hr capsule Take 1 capsule (150 mg total) by mouth daily with breakfast. 90 capsule 1    warfarin (COUMADIN) 5 MG tablet Take 1.5-2 tablets (7.5-10 mg total) by mouth See admin instructions. Pt takes one and a half tablets (7.'5mg'$  total) on Sunday and Wednesday and 2 tablets ('10mg'$  total) all other days. (Patient taking differently: Take 7.5-10 mg by mouth See admin instructions. 7.'5mg'$  on Sundays and '10mg'$  on all other days or as directed.)     levofloxacin (LEVAQUIN) 250 MG tablet Take 1 tablet (250 mg total) by mouth daily. (Patient not taking: Reported on 08/11/2021) 1 tablet 0     Scheduled Meds:  brivaracetam  100 mg Oral BID   Chlorhexidine Gluconate Cloth  6 each Topical Daily   lacosamide  200 mg Oral BID   lamoTRIgine  350 mg Oral BID   levothyroxine  50 mcg Oral QPM   OLANZapine zydis  5 mg Oral BID   pantoprazole  40 mg Oral Daily   venlafaxine XR  150 mg Oral Q breakfast   Continuous Infusions:  sodium chloride     PRN Meds:.acetaminophen **OR** acetaminophen, albuterol, clonazePAM, food thickener  Allergies:  Allergies  Allergen Reactions   Vicodin [Hydrocodone-Acetaminophen] Nausea And Vomiting   Morphine Rash   Morphine And Related Rash    Allergic reaction only in iv form    Family History  Problem Relation Age of Onset   Anxiety disorder Mother    Diabetes Father    Seizures Neg Hx     Social History:  reports that she has never smoked. She has never used smokeless tobacco. She reports that she does not drink alcohol and does not use drugs.  ROS: A complete review of systems was performed.  All systems are negative except for pertinent findings as noted.  Physical Exam:  Vital signs in last 24 hours: Temp:  [97.4 F (36.3 C)-98.6 F (37 C)] 98.4 F (36.9 C) (06/21 1228) Pulse Rate:  [93-117] 110 (06/21 1228) Resp:  [15-31] 18 (06/21 1228) BP: (98-118)/(48-90) 106/53 (06/21 1228) SpO2:  [91 %-100 %] 100 % (06/21 1228) Constitutional:  Alert, No acute distress Cardiovascular: Regular rate and rhythm, No JVD Respiratory: Normal respiratory  effort, Lungs clear bilaterally GI: Abdomen is soft, nontender, nondistended, no abdominal masses GU: No CVA tenderness, Urine is now grossly clear Lymphatic: No lymphadenopathy Neurologic: Grossly intact, no focal deficits Psychiatric: Normal mood and affect  Laboratory Data:  Recent Labs    08/11/21 0955 08/11/21 1000 08/11/21 1408 08/11/21 2032 08/12/21 0405 08/12/21 1219  WBC 9.4  --   --   --  7.8 6.8  HGB 9.0* 10.2*  10.2* 8.6* 8.8* 8.5* 10.2*  HCT 29.8* 30.0*  30.0* 27.9* 28.6*  27.3* 34.0*  PLT 317  --   --   --  299 245    Recent Labs    08/11/21 0955 08/11/21 1000 08/12/21 0405  NA 146* 144  144 144  K 3.6 3.6  3.6 3.6  CL 105 104 104  GLUCOSE 113* 109* 101*  BUN '15 17 14  '$ CALCIUM 10.4*  --  9.7  CREATININE 0.93 0.80 0.77     Results for orders placed or performed during the hospital encounter of 08/11/21 (from the past 24 hour(s))  Hemoglobin and hematocrit, blood     Status: Abnormal   Collection Time: 08/11/21  8:32 PM  Result Value Ref Range   Hemoglobin 8.8 (L) 12.0 - 15.0 g/dL   HCT 28.6 (L) 36.0 - 46.0 %  Protime-INR     Status: Abnormal   Collection Time: 08/11/21  8:32 PM  Result Value Ref Range   Prothrombin Time 23.7 (H) 11.4 - 15.2 seconds   INR 2.1 (H) 0.8 - 1.2  Protime-INR     Status: Abnormal   Collection Time: 08/12/21  2:48 AM  Result Value Ref Range   Prothrombin Time 18.2 (H) 11.4 - 15.2 seconds   INR 1.5 (H) 0.8 - 1.2  TSH     Status: None   Collection Time: 08/12/21  4:05 AM  Result Value Ref Range   TSH 2.983 0.350 - 4.500 uIU/mL  C-reactive protein     Status: Abnormal   Collection Time: 08/12/21  4:05 AM  Result Value Ref Range   CRP 23.9 (H) <1.0 mg/dL  Basic metabolic panel     Status: Abnormal   Collection Time: 08/12/21  4:05 AM  Result Value Ref Range   Sodium 144 135 - 145 mmol/L   Potassium 3.6 3.5 - 5.1 mmol/L   Chloride 104 98 - 111 mmol/L   CO2 28 22 - 32 mmol/L   Glucose, Bld 101 (H) 70 - 99 mg/dL    BUN 14 6 - 20 mg/dL   Creatinine, Ser 0.77 0.44 - 1.00 mg/dL   Calcium 9.7 8.9 - 10.3 mg/dL   GFR, Estimated >60 >60 mL/min   Anion gap 12 5 - 15  CBC     Status: Abnormal   Collection Time: 08/12/21  4:05 AM  Result Value Ref Range   WBC 7.8 4.0 - 10.5 K/uL   RBC 3.14 (L) 3.87 - 5.11 MIL/uL   Hemoglobin 8.5 (L) 12.0 - 15.0 g/dL   HCT 27.3 (L) 36.0 - 46.0 %   MCV 86.9 80.0 - 100.0 fL   MCH 27.1 26.0 - 34.0 pg   MCHC 31.1 30.0 - 36.0 g/dL   RDW 15.5 11.5 - 15.5 %   Platelets 299 150 - 400 K/uL   nRBC 0.0 0.0 - 0.2 %  CBC with Differential/Platelet     Status: Abnormal   Collection Time: 08/12/21 12:19 PM  Result Value Ref Range   WBC 6.8 4.0 - 10.5 K/uL   RBC 3.90 3.87 - 5.11 MIL/uL   Hemoglobin 10.2 (L) 12.0 - 15.0 g/dL   HCT 34.0 (L) 36.0 - 46.0 %   MCV 87.2 80.0 - 100.0 fL   MCH 26.2 26.0 - 34.0 pg   MCHC 30.0 30.0 - 36.0 g/dL   RDW 15.5 11.5 - 15.5 %   Platelets 245 150 - 400 K/uL   nRBC 0.0 0.0 - 0.2 %   Neutrophils Relative % 59 %   Neutro Abs 4.0 1.7 - 7.7 K/uL  Lymphocytes Relative 31 %   Lymphs Abs 2.1 0.7 - 4.0 K/uL   Monocytes Relative 7 %   Monocytes Absolute 0.5 0.1 - 1.0 K/uL   Eosinophils Relative 2 %   Eosinophils Absolute 0.1 0.0 - 0.5 K/uL   Basophils Relative 0 %   Basophils Absolute 0.0 0.0 - 0.1 K/uL   Immature Granulocytes 1 %   Abs Immature Granulocytes 0.06 0.00 - 0.07 K/uL   Recent Results (from the past 240 hour(s))  Blood Culture (routine x 2)     Status: None (Preliminary result)   Collection Time: 08/11/21  8:46 AM   Specimen: BLOOD LEFT WRIST  Result Value Ref Range Status   Specimen Description BLOOD LEFT WRIST  Final   Special Requests   Final    BOTTLES DRAWN AEROBIC AND ANAEROBIC Blood Culture adequate volume   Culture   Final    NO GROWTH < 24 HOURS Performed at [facility], [place] [address] [place], [place] [address]    Report Status PENDING  Incomplete  Urine Culture     Status: Abnormal (Preliminary result)    Collection Time: 08/11/21  8:46 AM   Specimen: In/Out Cath Urine  Result Value Ref Range Status   Specimen Description IN/OUT CATH URINE  Final   Special Requests NONE  Final   Culture (A)  Final    6,000 COLONIES/mL STAPHYLOCOCCUS AUREUS SUSCEPTIBILITIES TO FOLLOW Performed at [facility], [place] [address], [place], [place] [address]    Report Status PENDING  Incomplete  Resp Panel by RT-PCR (Flu A&B, Covid) Anterior Nasal Swab     Status: None   Collection Time: 08/11/21  8:52 AM   Specimen: Anterior Nasal Swab  Result Value Ref Range Status   SARS Coronavirus 2 by RT PCR NEGATIVE NEGATIVE Final    Comment: (NOTE) SARS-CoV-2 target nucleic acids are NOT DETECTED.  The SARS-CoV-2 RNA is generally detectable in upper respiratory specimens during the acute phase of infection. The lowest concentration of SARS-CoV-2 viral copies this assay can detect is 138 copies/mL. A negative result does not preclude SARS-Cov-2 infection and should not be used as the sole basis for treatment or other patient management decisions. A negative result may occur with  improper specimen collection/handling, submission of specimen other than nasopharyngeal swab, presence of viral mutation(s) within the areas targeted by this assay, and inadequate number of viral copies(<138 copies/mL). A negative result must be combined with clinical observations, patient history, and epidemiological information. The expected result is Negative.  Fact Sheet for Patients:  EntrepreneurPulse.com.au  Fact Sheet for Healthcare Providers:  IncredibleEmployment.be  This test is no t yet approved or cleared by the [place] FDA and  has been authorized for detection and/or diagnosis of SARS-CoV-2 by FDA under an Emergency Use Authorization (EUA). This EUA will remain  in effect (meaning this test can be used) for the duration of the COVID-19 declaration under Section 564(b)(1)  of the Act, 21 U.S.C.section 360bbb-3(b)(1), unless the authorization is terminated  or revoked sooner.       Influenza A by PCR NEGATIVE NEGATIVE Final   Influenza B by PCR NEGATIVE NEGATIVE Final    Comment: (NOTE) The Xpert Xpress SARS-CoV-2/FLU/RSV plus assay is intended as an aid in the diagnosis of influenza from Nasopharyngeal swab specimens and should not be used as a sole basis for treatment. Nasal washings and aspirates are unacceptable for Xpert Xpress SARS-CoV-2/FLU/RSV testing.  Fact Sheet for Patients: EntrepreneurPulse.com.au  Fact Sheet  for Healthcare Providers: IncredibleEmployment.be  This test is not yet approved or cleared by the [place] and has been authorized for detection and/or diagnosis of SARS-CoV-2 by FDA under an Emergency Use Authorization (EUA). This EUA will remain in effect (meaning this test can be used) for the duration of the COVID-19 declaration under Section 564(b)(1) of the Act, 21 U.S.C. section 360bbb-3(b)(1), unless the authorization is terminated or revoked.  Performed at [facility], [place] [address], [place], [place] [address]   Blood Culture (routine x 2)     Status: None (Preliminary result)   Collection Time: 08/11/21 10:07 AM   Specimen: BLOOD  Result Value Ref Range Status   Specimen Description BLOOD SITE NOT SPECIFIED  Final   Special Requests   Final    BOTTLES DRAWN AEROBIC AND ANAEROBIC Blood Culture adequate volume   Culture   Final    NO GROWTH < 24 HOURS Performed at [facility], [place] [address] [place], [place] [address]    Report Status PENDING  Incomplete    Renal Function: Recent Labs    08/11/21 0955 08/11/21 1000 08/12/21 0405  CREATININE 0.93 0.80 0.77   CrCl cannot be calculated (Unknown ideal weight.).  Radiologic Imaging: EEG adult  Result Date: 08/12/2021 Lora Havens, MD     08/12/2021  1:57 PM Patient Name: Zuha Dejonge MRN:  [id number] Epilepsy Attending: Lora Havens Referring Physician/Provider: Norval Morton, MD Date: 08/12/2021 Duration: 22.46 mins Patient history:  56 year old female with a history of hemorrhagic stroke and seizures who presents with altered mental status. EEG to evaluate for seizure. Level of alertness: Awake AEDs during EEG study: BRV, LTG, LCM Technical aspects: This EEG study was done with scalp electrodes positioned according to the 10-20 International system of electrode placement. Electrical activity was acquired at a sampling rate of '500Hz'$  and reviewed with a high frequency filter of '70Hz'$  and a low frequency filter of '1Hz'$ . EEG data were recorded continuously and digitally stored. Description: No posterior dominant rhythm was seen. EEG showed continuous generalized generalized 3-'6hz'$  theta-delta slowing.  There is also sharply contoured 3 to 5 Hz theta-delta slowing in right frontal region, maximal F8 consistent with breach artifact.  Hyperventilation and photic stimulation were not performed.    ABNORMALITY - Continuous slow, generalized - Breach artifact, right frontal region  IMPRESSION: This EEG is suggestive of cortical dysfunction in right frontal consistent with prior craniotomy.  Additionally there is moderate diffuse encephalopathy, nonspecific to etiology.  No seizures were seen throughout the recording. Lora Havens   US Pelvis Complete  Result Date: 08/11/2021 CLINICAL DATA:  Postmenopausal bleeding. EXAM: TRANSABDOMINAL ULTRASOUND OF PELVIS TECHNIQUE: Transabdominal ultrasound examination of the pelvis was performed including evaluation of the uterus, ovaries, adnexal regions, and pelvic cul-de-sac. COMPARISON:  CT AP 08/11/21. FINDINGS: Uterus Sub optimal evaluation. Ultrasound technologist reports patient exhibited altered mental status and was unable to be appropriately position for diagnostic study. Endometrium Thickness: Only a small portion of the endometrium was visualized  which measured 7 mm in thickness. Right ovary Not visualized. Left ovary Not visualized. Other findings:  No abnormal free fluid. IMPRESSION: 1. This study is essentially nondiagnostic. Sonographer reports patient exhibits altered mental status and was unable to be appropriately position for a diagnostic study. 2. A small portion of the endometrium was visualized measuring 7 mm.) This is considered abnormal in a postmenopausal female. When the patient's clinical condition improves and is able to be positioned appropriately for  a diagnostic ultrasound of the pelvis, repeat examination is recommended. Electronically Signed   By: Kerby Moors M.D.   On: 08/11/2021 14:38   CT Head Wo Contrast  Result Date: 08/11/2021 CLINICAL DATA:  Mental status changes of unknown cause in a 56 year old female. EXAM: CT HEAD WITHOUT CONTRAST TECHNIQUE: Contiguous axial images were obtained from the base of the skull through the vertex without intravenous contrast. RADIATION DOSE REDUCTION: This exam was performed according to the departmental dose-optimization program which includes automated exposure control, adjustment of the mA and/or kV according to patient size and/or use of iterative reconstruction technique. COMPARISON:  Multiple prior studies most recent of Jul 19, 2021. FINDINGS: Brain: No evidence of acute infarction, hemorrhage, hydrocephalus, extra-axial collection or mass lesion/mass effect. 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 Vascular: No hyperdense vessel or unexpected calcification. Skull: Signs of RIGHT frontotemporal craniotomy with similar appearance. No acute findings. Sinuses/Orbits: Unremarkable to the extent evaluated. Other: None. IMPRESSION: 1. No acute intracranial abnormality. 2. 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 Electronically Signed   By: Zetta Bills M.D.   On:  08/11/2021 10:53   CT ABDOMEN PELVIS W CONTRAST  Result Date: 08/11/2021 CLINICAL DATA:  Sepsis EXAM: CT ABDOMEN AND PELVIS WITH CONTRAST TECHNIQUE: Multidetector CT imaging of the abdomen and pelvis was performed using the standard protocol following bolus administration of intravenous contrast. RADIATION DOSE REDUCTION: This exam was performed according to the departmental dose-optimization program which includes automated exposure control, adjustment of the mA and/or kV according to patient size and/or use of iterative reconstruction technique. CONTRAST:  180m OMNIPAQUE IOHEXOL 300 MG/ML  SOLN COMPARISON:  06/10/2021 FINDINGS: Streak artifact from patient's arms. Lower chest: No acute abnormality. Hepatobiliary: No focal liver abnormality. Several small gallstones are again identified. No biliary dilatation. Pancreas: Unremarkable. Spleen: Unremarkable. Adrenals/Urinary Tract: Adrenals are unremarkable. Kidneys and bladder are unremarkable. Stomach/Bowel: Stomach is within normal limits. Bowel is normal in caliber. Vascular/Lymphatic: IVC filter is present.  No adenopathy. Reproductive: Uterus and bilateral adnexa are unremarkable. Other: No free fluid.  No acute abnormality of the abdominal wall. Musculoskeletal: No acute osseous abnormality. IMPRESSION: No acute process. Cholelithiasis. Electronically Signed   By: PMacy MisM.D.   On: 08/11/2021 10:48   DG Chest Port 1 View  Result Date: 08/11/2021 CLINICAL DATA:  Possible sepsis EXAM: PORTABLE CHEST 1 VIEW COMPARISON:  07/14/2021 FINDINGS: Patient is rotated. Persistent mild elevation the right hemidiaphragm. No new consolidation or edema. No pleural effusion. Stable cardiomediastinal contours. Stimulator generator overlies the left hilar region with lead extending superiorly. IMPRESSION: No acute process in the chest. Electronically Signed   By: PMacy MisM.D.   On: 08/11/2021 09:19    I independently reviewed the above imaging  studies.  Impression/Recommendation:  1) Gross hematuria: Her urine is now grossly clear and there is no indication for acute intervention.  Ok to remove catheter when no longer medically necessary.  Her renal function is normal and would be helpful to get a hematuria protocol CT scan of the abdomen and pelvis during her hospitalization.  Considering she already underwent contrast imaging and non-contrast scan is likely sufficient to rule out stone disease.  She will then need to follow up as an outpatient in the future for cystoscopy to complete her evaluation.  Discussed with her mother.  LDutch Gray6/21/2023, 4:28 PM  LPryor Curia MD   CC: Dr. SVerlon Au

## 2021-08-12 NOTE — Progress Notes (Deleted)
Pt not available in ED and being transferred to Floor 6N20. We will make another attempt later when schedule permits.

## 2021-08-12 NOTE — ED Notes (Signed)
After no activity in 10 minutes regarding bed 15's red-purpleman process from 6N, called 6N to request initiation. Followed up with email to Audie Pinto, RN, copied ED RN [place], Charge [place] Grose, and [place].

## 2021-08-12 NOTE — Progress Notes (Signed)
EEG complete - results pending 

## 2021-08-12 NOTE — Procedures (Signed)
Patient Name: Cassandra Andrade  MRN: [id number]  Epilepsy Attending: Lora Havens  Referring Physician/Provider: Norval Morton, MD  Date: 08/12/2021 Duration: 22.46 mins  Patient history:  56 year old female with a history of hemorrhagic stroke and seizures who presents with altered mental status. EEG to evaluate for seizure.   Level of alertness: Awake  AEDs during EEG study: BRV, LTG, LCM  Technical aspects: This EEG study was done with scalp electrodes positioned according to the 10-20 International system of electrode placement. Electrical activity was acquired at a sampling rate of '500Hz'$  and reviewed with a high frequency filter of '70Hz'$  and a low frequency filter of '1Hz'$ . EEG data were recorded continuously and digitally stored.   Description: No posterior dominant rhythm was seen. EEG showed continuous generalized generalized 3-'6hz'$  theta-delta slowing.  There is also sharply contoured 3 to 5 Hz theta-delta slowing in right frontal region, maximal F8 consistent with breach artifact.  Hyperventilation and photic stimulation were not performed.      ABNORMALITY - Continuous slow, generalized - Breach artifact, right frontal region   IMPRESSION: This EEG is suggestive of cortical dysfunction in right frontal consistent with prior craniotomy.  Additionally there is moderate diffuse encephalopathy, nonspecific to etiology.  No seizures were seen throughout the recording.  Marcia Lepera [name]

## 2021-08-12 NOTE — Evaluation (Signed)
Clinical/Bedside Swallow Evaluation Patient Details  Name: Cassandra Andrade MRN: [id number] Date of Birth: 1965/04/21  Today's Date: 08/12/2021 Time: SLP Start Time (ACUTE ONLY): 38 SLP Stop Time (ACUTE ONLY): 1600 SLP Time Calculation (min) (ACUTE ONLY): 20 min  Past Medical History:  Past Medical History:  Diagnosis Date   Anxiety    Arthritis    Cancer ([place])    Osteosarcoma, right leg   CVA (cerebrovascular accident due to intracerebral hemorrhage) ([place]) 2004   Depression    DVT (deep venous thrombosis) ([facility])    Left leg   MRSA (methicillin resistant Staphylococcus aureus)    S/P BKA (below knee amputation) unilateral ([place])    Right    Seizures ([place])    Thyroid disease    Unspecified epilepsy with intractable epilepsy 02/28/2013   Past Surgical History:  Past Surgical History:  Procedure Laterality Date   ABOVE KNEE LEG AMPUTATION Right    Osteosarcoma   BRAIN SURGERY  2004   cerebral aneurysm   FRACTURE SURGERY     Rt. femur, folllowed by MRSA   TOTAL KNEE ARTHROPLASTY Right    HPI:  Patient is a 56 y.o. 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 Patient recently admitted 5/23-5/29/23 with acute metabolic encephalopathy. Patient presented to the hospital on 08/11/21 and mother reported during current admission that patient has not been eating much since being discharged, has not had a BM in past couple of days, intermittently refusing pills/holding pills in mouth. In addition, mother noticing blood when changing patient's pull up diaper. In ED, patient with temperature of 99.7 degrees F, RR and oxygen saturations WFL. CT head did not reveal any acute abnormality with extensive encephalomalacia related to prior craniotomy.    Assessment / Plan /  Recommendation  Clinical Impression  Patient presents with clinical s/s of dysphagia as per this bedside/clinical swallow evaluation. She was awake and alert when SLP arrived and was agreeable to drinking some water and juice. She exhibited delayed coughing after straw sips of thin liquids (water) and immediate throat clearing when SLP providing controlled cup sips of thin liquids. She had difficulty drawing liquid through straw and this seemed to decrease during session and by end patient wasn't able to utilize straw at all. SLP recommending downgrade to Dys 1 (puree) solids and nectar thick liquids. SLP will f/u next date for diet toleration, determination of need for objective swallow study versus upgraded PO trials at bedside. SLP Visit Diagnosis: Dysphagia, unspecified (R13.10)    Aspiration Risk  Mild aspiration risk;Moderate aspiration risk    Diet Recommendation Dysphagia 1 (Puree);Nectar-thick liquid   Liquid Administration via: Cup;Straw Medication Administration: Crushed with puree Supervision: Full supervision/cueing for compensatory strategies;Staff to assist with self feeding Compensations: Minimize environmental distractions;Slow rate;Small sips/bites Postural Changes: Seated upright at 90 degrees    Other  Recommendations Oral Care Recommendations: Oral care BID;Staff/trained caregiver to provide oral care Other Recommendations: Order thickener from pharmacy;Prohibited food (jello, ice cream, thin soups);Clarify dietary restrictions;Remove water pitcher    Recommendations for follow up therapy are one component of a multi-disciplinary discharge planning process, led by the attending physician.  Recommendations may be updated based on patient status, additional functional criteria and insurance authorization.  Follow up Recommendations Home health SLP      Assistance Recommended at Discharge Frequent or constant Supervision/Assistance  Functional  Status Assessment Patient has  had a recent decline in their functional status and demonstrates the ability to make significant improvements in function in a reasonable and predictable amount of time.  Frequency and Duration min 2x/week  1 week       Prognosis Prognosis for Safe Diet Advancement: Fair Barriers to Reach Goals: Time post onset;Severity of deficits      Swallow Study   General Date of Onset: 08/12/21 HPI: Patient is a 56 y.o. 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 Patient recently admitted 5/23-5/29/23 with acute metabolic encephalopathy. Patient presented to the hospital on 08/11/21 and mother reported during current admission that patient has not been eating much since being discharged, has not had a BM in past couple of days, intermittently refusing pills/holding pills in mouth. In addition, mother noticing blood when changing patient's pull up diaper. In ED, patient with temperature of 99.7 degrees F, RR and oxygen saturations WFL. CT head did not reveal any acute abnormality with extensive encephalomalacia related to prior craniotomy. Type of Study: Bedside Swallow Evaluation Previous Swallow Assessment: had an MBS previous two months during previous admissions Diet Prior to this Study: Dysphagia 2 (chopped);Thin liquids Temperature Spikes Noted: No Respiratory Status: Room air History of Recent Intubation: No Behavior/Cognition: Alert;Cooperative;Pleasant mood Oral Cavity Assessment: Dry Oral Care Completed by SLP: Yes Oral Cavity - Dentition: Edentulous Self-Feeding Abilities: Total assist Patient Positioning: Upright in bed Baseline Vocal Quality: Low vocal intensity Volitional Cough: Weak Volitional Swallow: Unable to elicit    Oral/Motor/Sensory Function  Overall Oral Motor/Sensory Function: Moderate impairment Facial ROM: Reduced right;Reduced left Facial Strength: Reduced right;Reduced left Lingual Strength: Reduced   Ice Chips     Thin Liquid Thin Liquid: Impaired Presentation: Straw;Cup Oral Phase Impairments: Reduced labial seal;Reduced lingual movement/coordination Oral Phase Functional Implications: Prolonged oral transit Pharyngeal  Phase Impairments: Suspected delayed Swallow;Cough - Delayed;Cough - Immediate    Nectar Thick Nectar Thick Liquid: Not tested   Honey Thick Honey Thick Liquid: Not tested   Puree Puree: Not tested   Solid     Solid: Not tested      Sonia Baller, MA, CCC-SLP Speech Therapy

## 2021-08-12 NOTE — ED Notes (Signed)
Called Charge RN on 6N to inquire whether RN Richardson has read report (over 20 min.) and she replied that she would have the secretary discuss with 6N RN Richardson. 

## 2021-08-12 NOTE — Progress Notes (Signed)
[place] for warfarin Indication: DVT, lupus anticoagulant   Assessment: 22 yof with hx brain aneurysm rupture and ICH s/p VP shunt, functional quadriplegia/bedbound, chronic dysphagia on pureed diet, seizure disorder, DVT, lupus anticoagulant on chronic warfarin presenting with blood in diaper, unclear source. INR >10 on presentation. Noted recently on Levaquin for infection but off for several weeks now PTA. Pharmacy consulted to dose warfarin inpatient.   6/21: INR 1.5 (subtherapeutc), s/p 2.5 mg Vitamin K on 6/20. Hgb stable in 8s. Per RN, patient noted to have some dark reddish brown urine and reddish brown stains on diaper consistent with presentation. Will continue to monitor as CBC has remained stable. Discussed with Dr. Verlon Au who wants to hold warfarin as she might  need a urology workup for continued hematuria. DVT was in 2016, so no additional anticoagulation needed at this time.   PTA warfarin dose: '10mg'$  daily except 7.'5mg'$  on Sun (last dose 6/18 PTA)  Goal of Therapy:  INR 2-3 Monitor platelets by anticoagulation protocol: Yes   Plan:  Hold warfarin 6/21 Daily INR Monitor CBC, s/sx bleeding  Adria Dill, PharmD PGY-1 Acute Care Resident  08/12/2021 8:42 AM

## 2021-08-12 NOTE — ED Notes (Addendum)
Bed and nurse change on 6N to Bed 20. RN is now [employer]. Added RN Jasmin to chat re [facility]. Requested ED RN Alexa to take vitals, etc.

## 2021-08-12 NOTE — ED Notes (Signed)
Pt sheets adjusted and pt adjusted in bed with a draw sheet and chuck placed, diaper cleaned, yellow sock placed on lt foot, pillow placed under RBKA stump for support. Pt eyes are opened during this time however she was non verbal

## 2021-08-12 NOTE — ED Notes (Signed)
Attempted to draw morning labs unsuccessful. Lab at bedside at this time attempting to obtain morning labs

## 2021-08-12 NOTE — Progress Notes (Signed)
Pt not available in ED and being transferred to Floor 6N20 shortly. We will make another attempt later when schedule permits.

## 2021-08-12 NOTE — Progress Notes (Addendum)
PROGRESS NOTE   Cassandra Andrade  STM:[id number] DOB: 02/04/1966 DOA: 08/11/2021 PCP: Berkley Harvey, NP  Brief Narrative:   56 year old with known brain aneurysm rupture +IC/R MCA normal underlying wall hemorrhage status post VP shunt 2004 Prior stroke Lupus anticoagulant on Coumadin Intractable epilepsy previously followed by Dr. 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 Last admitted 5/23 through 07/20/2021 with acute metabolic encephalopathy thought to be secondary to Pseudomonas UTI superimposed on prior hemorrhage--- neurology was consulted at the time--patient was discharged on clonazepam 1 mg twice daily seizure Lamictal 350 twice daily Vimpat 200 twice daily by fact 100 twice daily and has had prior hallucinations on Onfi 10 twice daily  After last hospitalization she apparently has made significant decline and has become more nonverbal whereas prior she was able to speak and follow commands  Brought to ED 08/11/2021 secondary to possible vaginal versus rectal bleeding in addition to ongoing confusion, pocketing medications-because is on Coumadin concern with regards to acute bleed  Tmax 99.7 pulse rate 101 20 CT head no acute abnormality extensive encephalomalacia related to right craniotomy Hemoglobin 9 sodium 146 calcium 10.4 lactic acid 1.2 INR greater than 10 CT abdomen pelvis no acute abnormality cholelithiasis  urine culture collected Rx vitamin K Tylenol LR 1 L  Pelvic ultrasound showed nondiagnostic-small portion of endometrium visualized 7 mm considered abnormal-not a very good study repeat examination was recommended  GYN telephone consulted because of abnormal endometrial stripe of 7 mm Recommended determining cause by placing Foley catheter Recommended placing a tampon and calling back    Hospital-Problem based course  Bleeding from likely Hematuria  >menstrual or GI causes Etiology unclear --we will ask urology to consult-I do not think there is vaginal bleeding but this will be ruled out by placing a tampon into the introitus Patient has not had a stool and pads were soaked in blood tinged matter-- rules out a GI bleed Dilutional anemia Hemoglobin has dropped from 10-8.5 but is in keeping with prior values so I feel this is due to dilutional We will repeat labs in the morning Left lower extremity DVT in the past, lupus anticoagulant previously on Coumadin Prior Stroke Confounding issue-will need to be off anticoagulation until we can figure out bleeding source-May benefit more so from DOAC >Coumadin R MCA aneurysmal rupture status post VP shunt 2004, prior stroke--functional quadriplegia Chronic dysphagia on pured diet Has sequelae of poor responsivity and limitations Mother requesting feeding tube consideration although as this isnt her baseline  Intractable epilepsy previously followed by Dr. [name] Continue Briviact 100 twice daily, Vimpat 200 twice daily, Lamictal 350 twice daily, clonazepam 1 mg twice daily as needed seizure Depression/irritability and agitation Continue Zydis 5 twice daily, Effexor 150 daily a.m. Right-sided BKA secondary to osteosarcoma Surgical cure-Will need outpatient follow-up Stage II decubitus ulcer present on admission Place sacral dressing and protective   DVT prophylaxis: SCDs at this time Code Status: Full Family Communication: called to d/w mother Ronita Hipps [phone] Disposition:  Status is: Observation The patient will require care spanning > 2 midnights and should be moved to inpatient because:   Consultants:   Urology  Procedures: unclear  Antimicrobials:     Subjective:  can tell me at [place] Otherwise history is somewhat limited She does not seem confused and this might be her baseline   Objective: Vitals:   08/12/21 0500 08/12/21 0545 08/12/21 0557 08/12/21 0600   BP:  104/72   108/71  Pulse: (!) 110 (!) 109  (!) 105  Resp: 20 20  (!) 23  Temp:   (!) 97.4 F (36.3 C)   TempSrc:   Axillary   SpO2: 99% 99%  98%   No intake or output data in the 24 hours ending 08/12/21 0713 There were no vitals filed for this visit.  Examination:  Awake coherent seems a little bit sleepy Remnant tracheotomy noted tracheotomy scar noted S1-S2 no murmur Chest is clear no wheeze rales rhonchi I did not see any blood at the introitus and her rectum does not seem to have any blood She has a stage II decubitus ulcer on her sacrum  Data Reviewed: personally reviewed   Hemoglobin down from 10.2---> 8.5 WBC 7.8 platelet 299 TSH 2.9   Radiology Studies: US Pelvis Complete  Result Date: 08/11/2021 CLINICAL DATA:  Postmenopausal bleeding. EXAM: TRANSABDOMINAL ULTRASOUND OF PELVIS TECHNIQUE: Transabdominal ultrasound examination of the pelvis was performed including evaluation of the uterus, ovaries, adnexal regions, and pelvic cul-de-sac. COMPARISON:  CT AP 08/11/21. FINDINGS: Uterus Sub optimal evaluation. Ultrasound technologist reports patient exhibited altered mental status and was unable to be appropriately position for diagnostic study. Endometrium Thickness: Only a small portion of the endometrium was visualized which measured 7 mm in thickness. Right ovary Not visualized. Left ovary Not visualized. Other findings:  No abnormal free fluid. IMPRESSION: 1. This study is essentially nondiagnostic. Sonographer reports patient exhibits altered mental status and was unable to be appropriately position for a diagnostic study. 2. A small portion of the endometrium was visualized measuring 7 mm.) This is considered abnormal in a postmenopausal female. When the patient's clinical condition improves and is able to be positioned appropriately for a diagnostic ultrasound of the pelvis, repeat examination is recommended. Electronically Signed   By: Kerby Moors M.D.   On: 08/11/2021  14:38   CT Head Wo Contrast  Result Date: 08/11/2021 CLINICAL DATA:  Mental status changes of unknown cause in a 56 year old female. EXAM: CT HEAD WITHOUT CONTRAST TECHNIQUE: Contiguous axial images were obtained from the base of the skull through the vertex without intravenous contrast. RADIATION DOSE REDUCTION: This exam was performed according to the departmental dose-optimization program which includes automated exposure control, adjustment of the mA and/or kV according to patient size and/or use of iterative reconstruction technique. COMPARISON:  Multiple prior studies most recent of Jul 19, 2021. FINDINGS: Brain: No evidence of acute infarction, hemorrhage, hydrocephalus, extra-axial collection or mass lesion/mass effect. 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 Vascular: No hyperdense vessel or unexpected calcification. Skull: Signs of RIGHT frontotemporal craniotomy with similar appearance. No acute findings. Sinuses/Orbits: Unremarkable to the extent evaluated. Other: None. IMPRESSION: 1. No acute intracranial abnormality. 2. 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 Electronically Signed   By: Zetta Bills M.D.   On: 08/11/2021 10:53   CT ABDOMEN PELVIS W CONTRAST  Result Date: 08/11/2021 CLINICAL DATA:  Sepsis EXAM: CT ABDOMEN AND PELVIS WITH CONTRAST TECHNIQUE: Multidetector CT imaging of the abdomen and pelvis was performed using the standard protocol following bolus administration of intravenous contrast. RADIATION DOSE REDUCTION: This exam was performed according to the departmental dose-optimization program which includes automated exposure control, adjustment of the mA and/or kV according to patient size and/or use of iterative reconstruction technique. CONTRAST:  145m OMNIPAQUE IOHEXOL 300 MG/ML  SOLN COMPARISON:  06/10/2021 FINDINGS: Streak artifact from patient's arms.  Lower chest:  No acute abnormality. Hepatobiliary: No focal liver abnormality. Several small gallstones are again identified. No biliary dilatation. Pancreas: Unremarkable. Spleen: Unremarkable. Adrenals/Urinary Tract: Adrenals are unremarkable. Kidneys and bladder are unremarkable. Stomach/Bowel: Stomach is within normal limits. Bowel is normal in caliber. Vascular/Lymphatic: IVC filter is present.  No adenopathy. Reproductive: Uterus and bilateral adnexa are unremarkable. Other: No free fluid.  No acute abnormality of the abdominal wall. Musculoskeletal: No acute osseous abnormality. IMPRESSION: No acute process. Cholelithiasis. Electronically Signed   By: Macy Mis M.D.   On: 08/11/2021 10:48   DG Chest Port 1 View  Result Date: 08/11/2021 CLINICAL DATA:  Possible sepsis EXAM: PORTABLE CHEST 1 VIEW COMPARISON:  07/14/2021 FINDINGS: Patient is rotated. Persistent mild elevation the right hemidiaphragm. No new consolidation or edema. No pleural effusion. Stable cardiomediastinal contours. Stimulator generator overlies the left hilar region with lead extending superiorly. IMPRESSION: No acute process in the chest. Electronically Signed   By: Macy Mis M.D.   On: 08/11/2021 09:19     Scheduled Meds:  brivaracetam  100 mg Oral BID   lacosamide  200 mg Oral BID   lamoTRIgine  350 mg Oral BID   levothyroxine  50 mcg Oral QPM   OLANZapine zydis  5 mg Oral BID   pantoprazole  40 mg Oral Daily   sodium chloride flush  3 mL Intravenous Q12H   venlafaxine XR  150 mg Oral Q breakfast   Continuous Infusions:   LOS: 0 days   Time spent: [place], MD [facility] To contact the attending provider between 7A-7P or the covering provider during after hours 7P-7A, please log into the web site [email or web address] and access using universal [place] password for that web site. If you do not have the password, please call the hospital operator.  08/12/2021, 7:13 AM

## 2021-08-13 ENCOUNTER — Other Ambulatory Visit (HOSPITAL_COMMUNITY): Payer: Self-pay

## 2021-08-13 DIAGNOSIS — R58 Hemorrhage, not elsewhere classified: Secondary | ICD-10-CM | POA: Diagnosis not present

## 2021-08-13 LAB — CBC
HCT: 25.4 % — ABNORMAL LOW (ref 36.0–46.0)
Hemoglobin: 7.9 g/dL — ABNORMAL LOW (ref 12.0–15.0)
MCH: 27 pg (ref 26.0–34.0)
MCHC: 31.1 g/dL (ref 30.0–36.0)
MCV: 86.7 fL (ref 80.0–100.0)
Platelets: 294 10*3/uL (ref 150–400)
RBC: 2.93 MIL/uL — ABNORMAL LOW (ref 3.87–5.11)
RDW: 15.3 % (ref 11.5–15.5)
WBC: 7.3 10*3/uL (ref 4.0–10.5)
nRBC: 0 % (ref 0.0–0.2)

## 2021-08-13 LAB — URINE CULTURE: Culture: 6000 — AB

## 2021-08-13 LAB — BASIC METABOLIC PANEL
Anion gap: 13 (ref 5–15)
BUN: 17 mg/dL (ref 6–20)
CO2: 26 mmol/L (ref 22–32)
Calcium: 9.3 mg/dL (ref 8.9–10.3)
Chloride: 104 mmol/L (ref 98–111)
Creatinine, Ser: 0.81 mg/dL (ref 0.44–1.00)
GFR, Estimated: >60 mL/min (ref >60)
Glucose, Bld: 102 mg/dL — ABNORMAL HIGH (ref 70–99)
Potassium: 3.1 mmol/L — ABNORMAL LOW (ref 3.5–5.1)
Sodium: 143 mmol/L (ref 135–145)

## 2021-08-13 LAB — PROTIME-INR
INR: 1.5 — ABNORMAL HIGH (ref 0.8–1.2)
Prothrombin Time: 18.3 seconds — ABNORMAL HIGH (ref 11.4–15.2)

## 2021-08-13 MED ORDER — POTASSIUM CHLORIDE 10 MEQ/100ML IV SOLN
10.0000 meq | INTRAVENOUS | Status: AC
  Administered 2021-08-13 – 2021-08-14 (×4): 10 meq via INTRAVENOUS
  Filled 2021-08-13 (×4): qty 100

## 2021-08-13 MED ORDER — SODIUM CHLORIDE 0.9 % IV SOLN
200.0000 mg | Freq: Two times a day (BID) | INTRAVENOUS | Status: AC
  Administered 2021-08-13 – 2021-08-19 (×14): 200 mg via INTRAVENOUS
  Filled 2021-08-13 (×17): qty 20

## 2021-08-13 MED ORDER — ENOXAPARIN SODIUM 40 MG/0.4ML IJ SOSY
40.0000 mg | PREFILLED_SYRINGE | INTRAMUSCULAR | Status: DC
  Administered 2021-08-13: 40 mg via SUBCUTANEOUS
  Filled 2021-08-13: qty 0.4

## 2021-08-13 MED ORDER — LORAZEPAM 2 MG/ML IJ SOLN
0.5000 mg | Freq: Two times a day (BID) | INTRAMUSCULAR | Status: DC | PRN
  Filled 2021-08-13: qty 1

## 2021-08-13 MED ORDER — LEVOTHYROXINE SODIUM 100 MCG/5ML IV SOLN
25.0000 ug | Freq: Every day | INTRAVENOUS | Status: DC
  Filled 2021-08-13: qty 5

## 2021-08-13 MED ORDER — SODIUM CHLORIDE 0.9 % IV SOLN
100.0000 mg | Freq: Two times a day (BID) | INTRAVENOUS | Status: AC
  Administered 2021-08-13 – 2021-08-19 (×14): 100 mg via INTRAVENOUS
  Filled 2021-08-13 (×19): qty 10

## 2021-08-13 NOTE — Progress Notes (Signed)
At bedside for PIV placement. Pt noted to have significant contractures in bilateral upper arms. Left side completely immobile. Right side with minimal movement and high spasticity.Able to place a PIV x 2 attempts. Very poor vasculature. If further access is needed, a central line would be recommended for this patient. RN aware.

## 2021-08-13 NOTE — Progress Notes (Signed)
Received consult for G-tube placement Consent obtained from mother, Ronita Hipps. Lovenox given today at 4pm.  Will plan to place early next week.  Will need to hold 1 dose. Formal consult note to follow.   Electronically Signed: Pasty Spillers, PA-C 08/13/2021, 4:54 PM

## 2021-08-13 NOTE — Progress Notes (Signed)
ANTICOAGULATION CONSULT NOTE - Follow Up Consult  Pharmacy Consult for warfarin Indication:  DVT, lupus anticoagulant  Allergies  Allergen Reactions   Vicodin [Hydrocodone-Acetaminophen] Nausea And Vomiting   Morphine Rash   Morphine And Related Rash    Allergic reaction only in iv form    Patient Measurements:    Vital Signs: Temp: 98.2 F (36.8 C) (06/22 0754) Temp Source: Axillary (06/22 0754) BP: 109/57 (06/22 0754) Pulse Rate: 97 (06/22 0754)  Labs: Recent Labs    08/11/21 0955 08/11/21 1000 08/11/21 1408 08/11/21 2032 08/12/21 0248 08/12/21 0405 08/12/21 1219 08/13/21 0109  HGB 9.0* 10.2*  10.2*   < > 8.8*  --  8.5* 10.2* 7.9*  HCT 29.8* 30.0*  30.0*   < > 28.6*  --  27.3* 34.0* 25.4*  PLT 317  --   --   --   --  299 245 294  APTT 179*  --   --   --   --   --   --   --   LABPROT >90.0*  --   --  23.7* 18.2*  --   --  18.3*  INR >10.0*  --   --  2.1* 1.5*  --   --  1.5*  CREATININE 0.93 0.80  --   --   --  0.77  --  0.81   < > = values in this interval not displayed.    CrCl cannot be calculated (Unknown ideal weight.).   Medications:  Medications Prior to Admission  Medication Sig Dispense Refill Last Dose   acetaminophen (TYLENOL) 650 MG CR tablet Take 650 mg by mouth every 8 (eight) hours as needed for pain.   unknown   Ascorbic Acid (VITAMIN C) 1000 MG tablet Take 1,000 mg by mouth daily.   08/10/2021   BRIVIACT 100 MG TABS tablet Take 100 mg by mouth 2 (two) times daily.   08/10/2021   clonazePAM (KLONOPIN) 1 MG tablet One tablet twice daily as needed for seizure. (Patient taking differently: Take 1 mg by mouth 2 (two) times daily as needed (seizures). One tablet twice daily as needed for seizure.) 30 tablet 5 unknown   ketoconazole (NIZORAL) 2 % shampoo Apply 1 application topically 2 (two) times a week. Monday,wednesday   Past Week   lacosamide (VIMPAT) 200 MG TABS tablet Take 1 tablet (200 mg total) by mouth 2 (two) times daily. 180 tablet 5  08/10/2021   lamoTRIgine (LAMICTAL) 100 MG tablet Take 3 tablets (300 mg total) by mouth 2 (two) times daily along with 50 mg 2 (two) times daily Total of 350 mg twice daily (300 mg+ 50 mg) (Patient taking differently: Take 300 mg by mouth See admin instructions. Take 3 tablets (300 mg total) by mouth 2 (two) times daily along with 50 mg 2 (two) times daily Total of 350 mg twice daily (300 mg+ 50 mg)) 180 tablet 5 08/10/2021   lamoTRIgine (LAMICTAL) 25 MG tablet Take 2 tablets (50 mg total) by mouth 2 (two) times daily along with 300 mg 2 (two) times daily. Total of 350 mg twice daily (300 mg+ 50 mg) (Patient taking differently: Take 50 mg by mouth See admin instructions. Take 2 tablets (50 mg total) by mouth 2 (two) times daily along with 300 mg 2 (two) times daily. Total of 350 mg twice daily (300 mg+ 50 mg)) 120 tablet 5 08/10/2021   levothyroxine (SYNTHROID, LEVOTHROID) 50 MCG tablet Take 50 mcg by mouth every evening.   08/10/2021  OLANZapine zydis (ZYPREXA) 5 MG disintegrating tablet Take 5 mg by mouth 2 (two) times daily.   08/10/2021   omeprazole (PRILOSEC) 20 MG capsule Take 20 mg by mouth at bedtime.   08/10/2021   promethazine (PHENERGAN) 25 MG tablet Take 25 mg by mouth every 8 (eight) hours as needed for nausea or vomiting.   unknown   venlafaxine XR (EFFEXOR-XR) 150 MG 24 hr capsule Take 1 capsule (150 mg total) by mouth daily with breakfast. 90 capsule 1 08/10/2021   warfarin (COUMADIN) 5 MG tablet Take 1.5-2 tablets (7.5-10 mg total) by mouth See admin instructions. Pt takes one and a half tablets (7.'5mg'$  total) on Sunday and Wednesday and 2 tablets ('10mg'$  total) all other days. (Patient taking differently: Take 7.5-10 mg by mouth See admin instructions. 7.'5mg'$  on Sundays and '10mg'$  on all other days or as directed.)   08/09/2021 at 10:00am   levofloxacin (LEVAQUIN) 250 MG tablet Take 1 tablet (250 mg total) by mouth daily. (Patient not taking: Reported on 08/11/2021) 1 tablet 0 Not Taking     Assessment: 57 yof with hx brain aneurysm rupture and ICH s/p VP shunt, functional quadriplegia/bedbound, chronic dysphagia on pureed diet, seizure disorder, DVT, lupus anticoagulant on chronic warfarin presenting with blood in diaper, unclear source. INR >10 on presentation. Noted recently on Levaquin for infection but off for several weeks now PTA. Pharmacy consulted to dose warfarin inpatient.   INR subtherapeutic at 1.5 today. No bleeding noted, Hgb down 7.9, platelets are normal. Urology notes hematuria has resolved. Inability to keep up with PO nutrition likely contributed to high INR on admit.  Goal of Therapy:  INR 2-3 Monitor platelets by anticoagulation protocol: Yes   Plan:  Hold warfarin tonight per discussion with Dr Verlon Au Lovenox 40 mg SQ q24h for VTE prophylaxis F/U plan for anticoag   Thank you for involving pharmacy in this patient's care.  Renold Genta, PharmD, BCPS Clinical Pharmacist Clinical phone for 08/13/2021 until 3p is [id number] 08/13/2021 10:40 AM  **Pharmacist phone directory can be found on [email or web address] listed under [place]**

## 2021-08-13 NOTE — Progress Notes (Signed)
Speech Language Pathology Treatment: Dysphagia  Patient Details Name: Cassandra Andrade MRN: [id number] DOB: May 29, 1965 Today's Date: 08/13/2021 Time: 4765-4650 SLP Time Calculation (min) (ACUTE ONLY): 16 min  Assessment / Plan / Recommendation Clinical Impression  Pt demonstrated decreased functional ability to consume po's compared to yesterday. Speaking in phrases and short sentences was mostly unintelligible. RN reports pt has been pocketing/holding food today. She consumed one sip nectar thick tea and 3 bites magic cup ice cream. Oral delay, pocketing and holding and 3rd trial she was unable to propel to posterior oral cavity and therapist suctioned complete bolus. Significant laryngeal bobbing during attempts to swallow suspecting pooled into pharynx. One minimal throat clear noted. Upon arrival from retrieving [place] ice cream spilling from right side mouth. No further attempts. Dysphagia appears multifactorial with lethargy, cognitive impairments and neurogenic affecting aspects of swallow. Uncertain if this will continue as a progression and nature of her dysphagia in setting of significant hemorrhage or if her ability will wax and wane. As pt is able continue puree, nectar thick liquids, crush meds, full assist and continued ST (will plan to follow up next week).    HPI HPI: Patient is a 56 y.o. 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 Patient recently admitted 5/23-5/29/23 with acute metabolic encephalopathy. Patient presented to the hospital on 08/11/21 and mother reported during current admission that patient has not been eating much since being discharged, has not had a BM in past couple of days, intermittently refusing pills/holding pills in mouth. In addition,  mother noticing blood when changing patient's pull up diaper. In ED, patient with temperature of 99.7 degrees F, RR and oxygen saturations WFL. CT head did not reveal any acute abnormality with extensive encephalomalacia related to prior craniotomy.      SLP Plan  Continue with current plan of care      Recommendations for follow up therapy are one component of a multi-disciplinary discharge planning process, led by the attending physician.  Recommendations may be updated based on patient status, additional functional criteria and insurance authorization.    Recommendations  Diet recommendations: Dysphagia 1 (puree);Nectar-thick liquid Liquids provided via: Teaspoon Medication Administration: Crushed with puree Supervision: Full supervision/cueing for compensatory strategies;Staff to assist with self feeding Compensations: Minimize environmental distractions;Slow rate;Small sips/bites;Lingual sweep for clearance of pocketing Postural Changes and/or Swallow Maneuvers: Seated upright 90 degrees;Upright 30-60 min after meal                Oral Care Recommendations: Oral care BID Follow Up Recommendations:  (TBD) Assistance recommended at discharge: Frequent or constant Supervision/Assistance SLP Visit Diagnosis: Dysphagia, oropharyngeal phase (R13.12) Plan: Continue with current plan of care           Houston Siren  08/13/2021, 2:36 PM

## 2021-08-13 NOTE — Plan of Care (Signed)
°  Problem: Clinical Measurements: °Goal: Diagnostic test results will improve °Outcome: Progressing °  °Problem: Clinical Measurements: °Goal: Will remain free from infection °Outcome: Progressing °  °

## 2021-08-13 NOTE — Progress Notes (Signed)
Patient last ate yesterday 08/12/21 around 1600. Patient was able to eat about 20% of the meal. She did not eat dinner last night and agreed to eat breakfast, however, she would not swallow. Was not able to administer oral medications. MD aware

## 2021-08-13 NOTE — Progress Notes (Signed)
PROGRESS NOTE   Cassandra Andrade  RDE:[id number] DOB: 11/03/1965 DOA: 08/11/2021 PCP: Berkley Harvey, NP  Brief Narrative:   56 year old with known brain aneurysm rupture +IC/R MCA normal underlying wall hemorrhage status post VP shunt 2004 Lupus anticoagulant on Coumadin Intractable epilepsy previously followed by Dr. 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 Last admitted 5/23 through 07/20/2021 with acute metabolic encephalopathy thought to be secondary to Pseudomonas UTI superimposed on prior hemorrhage--- neurology was consulted at the time--patient was discharged on clonazepam 1 mg twice daily seizure Lamictal 350 twice daily Vimpat 200 twice daily by fact 100 twice daily and has had prior hallucinations on Onfi 10 twice daily  After last hospitalization she apparently has made significant decline and has become more nonverbal whereas prior she was able to speak and follow commands  Brought to ED 08/11/2021 secondary to possible vaginal versus rectal bleeding in addition to ongoing confusion, pocketing medications-because is on Coumadin concern with regards to acute bleed  Tmax 99.7 pulse rate 101 CT head no acute abnormality extensive encephalomalacia related to right craniotomy Hemoglobin 9 sodium 146 calcium 10.4 lactic acid 1.2 INR greater than 10 CT abdomen pelvis no acute abnormality cholelithiasis  urine culture collected Rx vitamin K Tylenol LR 1 L  Pelvic ultrasound showed nondiagnostic-small portion of endometrium visualized 7 mm considered abnormal-not a very good study repeat examination was recommended  GYN telephone consulted because of abnormal endometrial stripe of 7 mm Recommended determining cause by placing Foley catheter Recommended placing a tampon and calling back    Hospital-Problem based course  Hematuria No GYN/GI bleed Hematuria is cleared  appreciate Dr. Alinda Money input--Foley to be removed and if no further hematuria may resume anticoagulation as below Will need outpatient evaluation for work-up Dilutional anemia Hemoglobin still stable and within the high 7-8-1/2 range Transfusion triggers less than 7 Left lower extremity DVT in the past, lupus anticoagulant previously on Coumadin Prior Stroke need discussion in the next several days regarding if we can resume anticoagulation-May use DOAC given less need for monitoring as INR was supratherapeutic above 10 on arrival If there is no bleeding in the morning R MCA aneurysmal rupture status post VP shunt 2004, prior stroke--functional quadriplegia Chronic dysphagia on pured diet Has sequelae of poor responsivity and limitations--has also not really been eating and is unable to keep up with nutrition We will ask for PEG tube to be placed and will initiate tube feeds on her and change meds Intractable epilepsy previously followed by Dr. [name] Changed meds to IV-Briviact 100 every 12, Vimpat 200 every 12 clonazepam 1 twice daily changed to Ativan No IV formulation for Lamictal so hold for now Depression/irritability and agitation Continue Zydis 5 twice daily, Effexor 150 daily when G-tube is able to be placed Hypothyroidism Replace IV with Synthroid 25 mcg daily Right-sided BKA secondary to osteosarcoma Surgical cure-Will need outpatient follow-up Stage II decubitus ulcer present on admission Place sacral dressing and protective   DVT prophylaxis: SCDs at this time Code Status: Full Family Communication: called to d/w mother Ronita Hipps [phone] on 08/13/2021 Disposition:  Status is: Observation The patient will require care spanning > 2 midnights and should be moved to inpatient because:   Consultants:   Urology  Procedures: unclear  Antimicrobials:     Subjective:  More awake coherent but not really eating Main meal yesterday was at lunchtime Speech therapy  saw her at that time and  downgraded her to dysphagia 1   Objective: Vitals:   08/12/21 1630 08/12/21 2104 08/13/21 0433 08/13/21 0754  BP: 105/60 109/67 103/60 (!) 109/57  Pulse: (!) 113 96 98 97  Resp: (!) 9 (!) '22 20 17  '$ Temp: 98.2 F (36.8 C)  97.6 F (36.4 C) 98.2 F (36.8 C)  TempSrc: Oral  Oral Axillary  SpO2: 100% 100% 98% 100%    Intake/Output Summary (Last 24 hours) at 08/13/2021 1228 Last data filed at 08/13/2021 4315 Gross per 24 hour  Intake 1055.31 ml  Output 350 ml  Net 705.31 ml   There were no vitals filed for this visit.  Examination:  Awake coherent seems a little bit sleepy Remnant tracheotomy noted tracheotomy scar noted S1-S2 no murmur Chest is clear no wheeze rales rhonchi I did not see any blood at the introitus and her rectum does not seem to have any blood She has a stage II decubitus ulcer on her sacrum  Data Reviewed: personally reviewed   Hemoglobin down from 10.2---> 7.9  WBC 7.3 platelet 294 TSH 2.9   Radiology Studies: CT RENAL STONE STUDY  Result Date: 08/12/2021 CLINICAL DATA:  Flank pain.  Concern for kidney stone. EXAM: CT ABDOMEN AND PELVIS WITHOUT CONTRAST TECHNIQUE: Multidetector CT imaging of the abdomen and pelvis was performed following the standard protocol without IV contrast. RADIATION DOSE REDUCTION: This exam was performed according to the departmental dose-optimization program which includes automated exposure control, adjustment of the mA and/or kV according to patient size and/or use of iterative reconstruction technique. COMPARISON:  CT abdomen pelvis dated 08/11/2021. FINDINGS: Evaluation of this exam is limited in the absence of intravenous contrast. Lower chest: Minimal bibasilar linear atelectasis. The visualized lung bases are otherwise clear. No intra-abdominal free air or free fluid. Hepatobiliary: The liver is unremarkable. Multiple gallstones. There is vicarious excretion of contrast within the gallbladder. No  pericholecystic fluid or evidence of acute cholecystitis by CT. Pancreas: Unremarkable. No pancreatic ductal dilatation or surrounding inflammatory changes. Spleen: Normal in size without focal abnormality. Adrenals/Urinary Tract: The adrenal glands unremarkable. The kidneys, visualized ureters, and urinary bladder appear unremarkable. A Foley catheter with balloon in the lumen of the bladder. Air within the bladder introduced via the catheter. Excreted contrast from recent IV administration noted within the bladder. Stomach/Bowel: There is moderate stool throughout the colon. There is no bowel obstruction or active inflammation. The appendix is normal. Vascular/Lymphatic: The abdominal aorta is unremarkable. An infrarenal IVC filter is noted. No portal venous gas. There is no adenopathy. Reproductive: The uterus is retroverted and grossly unremarkable. A tampon is seen in the vagina. No adnexal masses. Other: Small fat containing supraumbilical hernia. Musculoskeletal: Osteopenia.  No acute osseous pathology. IMPRESSION: 1. No acute intra-abdominal or pelvic pathology. No hydronephrosis or nephrolithiasis. 2. Cholelithiasis. 3. Moderate colonic stool burden. No bowel obstruction. Normal appendix. Electronically Signed   By: Anner Crete M.D.   On: 08/12/2021 20:20   EEG adult  Result Date: 08/12/2021 Lora Havens, MD     08/12/2021  1:57 PM Patient Name: Cassandra Andrade MRN: [id number] Epilepsy Attending: Lora Havens Referring Physician/Provider: Norval Morton, MD Date: 08/12/2021 Duration: 22.46 mins Patient history:  56 year old female with a history of hemorrhagic stroke and seizures who presents with altered mental status. EEG to evaluate for seizure. Level of alertness: Awake AEDs during EEG study: BRV, LTG, LCM Technical aspects: This EEG study was done with scalp electrodes positioned according to the 10-20 International system of electrode placement.  Electrical activity was acquired at a  sampling rate of '500Hz'$  and reviewed with a high frequency filter of '70Hz'$  and a low frequency filter of '1Hz'$ . EEG data were recorded continuously and digitally stored. Description: No posterior dominant rhythm was seen. EEG showed continuous generalized generalized 3-'6hz'$  theta-delta slowing.  There is also sharply contoured 3 to 5 Hz theta-delta slowing in right frontal region, maximal F8 consistent with breach artifact.  Hyperventilation and photic stimulation were not performed.    ABNORMALITY - Continuous slow, generalized - Breach artifact, right frontal region  IMPRESSION: This EEG is suggestive of cortical dysfunction in right frontal consistent with prior craniotomy.  Additionally there is moderate diffuse encephalopathy, nonspecific to etiology.  No seizures were seen throughout the recording. Lora Havens   US Pelvis Complete  Result Date: 08/11/2021 CLINICAL DATA:  Postmenopausal bleeding. EXAM: TRANSABDOMINAL ULTRASOUND OF PELVIS TECHNIQUE: Transabdominal ultrasound examination of the pelvis was performed including evaluation of the uterus, ovaries, adnexal regions, and pelvic cul-de-sac. COMPARISON:  CT AP 08/11/21. FINDINGS: Uterus Sub optimal evaluation. Ultrasound technologist reports patient exhibited altered mental status and was unable to be appropriately position for diagnostic study. Endometrium Thickness: Only a small portion of the endometrium was visualized which measured 7 mm in thickness. Right ovary Not visualized. Left ovary Not visualized. Other findings:  No abnormal free fluid. IMPRESSION: 1. This study is essentially nondiagnostic. Sonographer reports patient exhibits altered mental status and was unable to be appropriately position for a diagnostic study. 2. A small portion of the endometrium was visualized measuring 7 mm.) This is considered abnormal in a postmenopausal female. When the patient's clinical condition improves and is able to be positioned appropriately for a  diagnostic ultrasound of the pelvis, repeat examination is recommended. Electronically Signed   By: Kerby Moors M.D.   On: 08/11/2021 14:38     Scheduled Meds:  Chlorhexidine Gluconate Cloth  6 each Topical Daily   lamoTRIgine  350 mg Oral BID   levothyroxine  50 mcg Oral QPM   OLANZapine zydis  5 mg Oral BID   pantoprazole  40 mg Oral Daily   venlafaxine XR  150 mg Oral Q breakfast   Continuous Infusions:  brivaracetam (BRIVIACT) 100 mg in sodium chloride 0.9 % 50 mL     lacosamide (VIMPAT) IV       LOS: 1 day   Time spent: [place], MD [facility] To contact the attending provider between 7A-7P or the covering provider during after hours 7P-7A, please log into the web site [email or web address] and access using universal [place] password for that web site. If you do not have the password, please call the hospital operator.  08/13/2021, 12:28 PM

## 2021-08-14 DIAGNOSIS — R58 Hemorrhage, not elsewhere classified: Secondary | ICD-10-CM | POA: Diagnosis not present

## 2021-08-14 LAB — BASIC METABOLIC PANEL
Anion gap: 12 (ref 5–15)
BUN: 11 mg/dL (ref 6–20)
CO2: 25 mmol/L (ref 22–32)
Calcium: 9.3 mg/dL (ref 8.9–10.3)
Chloride: 112 mmol/L — ABNORMAL HIGH (ref 98–111)
Creatinine, Ser: 0.66 mg/dL (ref 0.44–1.00)
GFR, Estimated: >60 mL/min (ref >60)
Glucose, Bld: 102 mg/dL — ABNORMAL HIGH (ref 70–99)
Potassium: 3.5 mmol/L (ref 3.5–5.1)
Sodium: 149 mmol/L — ABNORMAL HIGH (ref 135–145)

## 2021-08-14 LAB — GLUCOSE, CAPILLARY: Glucose-Capillary: 110 mg/dL — ABNORMAL HIGH (ref 70–99)

## 2021-08-14 LAB — MAGNESIUM: Magnesium: 1.6 mg/dL — ABNORMAL LOW (ref 1.7–2.4)

## 2021-08-14 MED ORDER — ENOXAPARIN SODIUM 60 MG/0.6ML IJ SOSY
60.0000 mg | PREFILLED_SYRINGE | Freq: Two times a day (BID) | INTRAMUSCULAR | Status: AC
  Administered 2021-08-15 – 2021-08-16 (×3): 60 mg via SUBCUTANEOUS
  Filled 2021-08-14 (×3): qty 0.6

## 2021-08-14 MED ORDER — ENOXAPARIN SODIUM 300 MG/3ML IJ SOLN
20.0000 mg | Freq: Once | INTRAMUSCULAR | Status: AC
Start: 2021-08-14 — End: 2021-08-14
  Administered 2021-08-14: 20 mg via SUBCUTANEOUS
  Filled 2021-08-14: qty 0.2

## 2021-08-14 MED ORDER — CEFAZOLIN SODIUM-DEXTROSE 2-4 GM/100ML-% IV SOLN
2.0000 g | INTRAVENOUS | Status: AC
  Administered 2021-08-17: 2 g via INTRAVENOUS
  Filled 2021-08-14: qty 100

## 2021-08-14 MED ORDER — FLUCONAZOLE 100 MG PO TABS
100.0000 mg | ORAL_TABLET | Freq: Every day | ORAL | Status: DC
  Administered 2021-08-15 – 2021-08-18 (×4): 100 mg via ORAL
  Filled 2021-08-14 (×4): qty 1

## 2021-08-14 MED ORDER — LACTATED RINGERS IV SOLN: INTRAVENOUS | Status: DC

## 2021-08-14 MED ORDER — ENOXAPARIN SODIUM 40 MG/0.4ML IJ SOSY
40.0000 mg | PREFILLED_SYRINGE | Freq: Every day | INTRAMUSCULAR | Status: DC
  Administered 2021-08-14: 40 mg via SUBCUTANEOUS
  Filled 2021-08-14: qty 0.4

## 2021-08-14 MED ORDER — ORAL CARE MOUTH RINSE
15.0000 mL | OROMUCOSAL | Status: DC
  Administered 2021-08-14 – 2021-08-25 (×44): 15 mL via OROMUCOSAL

## 2021-08-14 MED ORDER — FLUCONAZOLE 100 MG PO TABS
200.0000 mg | ORAL_TABLET | Freq: Once | ORAL | Status: AC
  Administered 2021-08-14: 200 mg via ORAL
  Filled 2021-08-14: qty 2

## 2021-08-14 MED ORDER — ORAL CARE MOUTH RINSE: 15.0000 mL | OROMUCOSAL | Status: DC | PRN

## 2021-08-14 NOTE — Plan of Care (Addendum)
Vitals stable. On and off alertness.  Urine output is cloudy, as per MD Garner Nash, no need for repeat urinalysis.  Plan for G tube placement next week.  Problem: Education: Goal: Knowledge of General Education information will improve Description: Including pain rating scale, medication(s)/side effects and non-pharmacologic comfort measures Outcome: Progressing   Problem: Health Behavior/Discharge Planning: Goal: Ability to manage health-related needs will improve Outcome: Progressing   Problem: Clinical Measurements: Goal: Ability to maintain clinical measurements within normal limits will improve Outcome: Progressing Goal: Will remain free from infection Outcome: Progressing Goal: Diagnostic test results will improve Outcome: Progressing Goal: Respiratory complications will improve Outcome: Progressing Goal: Cardiovascular complication will be avoided Outcome: Progressing   Problem: Activity: Goal: Risk for activity intolerance will decrease Outcome: Progressing   Problem: Nutrition: Goal: Adequate nutrition will be maintained Outcome: Progressing   Problem: Coping: Goal: Level of anxiety will decrease Outcome: Progressing   Problem: Elimination: Goal: Will not experience complications related to bowel motility Outcome: Progressing Goal: Will not experience complications related to urinary retention Outcome: Progressing   Problem: Pain Managment: Goal: General experience of comfort will improve Outcome: Progressing   Problem: Safety: Goal: Ability to remain free from injury will improve Outcome: Progressing   Problem: Skin Integrity: Goal: Risk for impaired skin integrity will decrease Outcome: Progressing

## 2021-08-14 NOTE — Progress Notes (Signed)
ANTICOAGULATION CONSULT NOTE  Pharmacy Consult for warfarin Indication:  DVT, lupus anticoagulant  Allergies  Allergen Reactions   Vicodin [Hydrocodone-Acetaminophen] Nausea And Vomiting   Morphine Rash   Morphine And Related Rash    Allergic reaction only in iv form    Patient Measurements:    Vital Signs: Temp: 97.6 F (36.4 C) (06/23 0546) Temp Source: Oral (06/23 0546) BP: 119/62 (06/23 0546) Pulse Rate: 92 (06/23 0546)  Labs: Recent Labs    08/11/21 0955 08/11/21 1000 08/11/21 2032 08/12/21 0248 08/12/21 0405 08/12/21 1219 08/13/21 0109 08/14/21 0101  HGB 9.0*   < > 8.8*  --  8.5* 10.2* 7.9*  --   HCT 29.8*   < > 28.6*  --  27.3* 34.0* 25.4*  --   PLT 317  --   --   --  299 245 294  --   APTT 179*  --   --   --   --   --   --   --   LABPROT >90.0*  --  23.7* 18.2*  --   --  18.3*  --   INR >10.0*  --  2.1* 1.5*  --   --  1.5*  --   CREATININE 0.93   < >  --   --  0.77  --  0.81 0.66   < > = values in this interval not displayed.    CrCl cannot be calculated (Unknown ideal weight.).   Medications:  Medications Prior to Admission  Medication Sig Dispense Refill Last Dose   acetaminophen (TYLENOL) 650 MG CR tablet Take 650 mg by mouth every 8 (eight) hours as needed for pain.   unknown   Ascorbic Acid (VITAMIN C) 1000 MG tablet Take 1,000 mg by mouth daily.   08/10/2021   BRIVIACT 100 MG TABS tablet Take 100 mg by mouth 2 (two) times daily.   08/10/2021   clonazePAM (KLONOPIN) 1 MG tablet One tablet twice daily as needed for seizure. (Patient taking differently: Take 1 mg by mouth 2 (two) times daily as needed (seizures). One tablet twice daily as needed for seizure.) 30 tablet 5 unknown   ketoconazole (NIZORAL) 2 % shampoo Apply 1 application topically 2 (two) times a week. Monday,wednesday   Past Week   lacosamide (VIMPAT) 200 MG TABS tablet Take 1 tablet (200 mg total) by mouth 2 (two) times daily. 180 tablet 5 08/10/2021   lamoTRIgine (LAMICTAL) 100 MG  tablet Take 3 tablets (300 mg total) by mouth 2 (two) times daily along with 50 mg 2 (two) times daily Total of 350 mg twice daily (300 mg+ 50 mg) (Patient taking differently: Take 300 mg by mouth See admin instructions. Take 3 tablets (300 mg total) by mouth 2 (two) times daily along with 50 mg 2 (two) times daily Total of 350 mg twice daily (300 mg+ 50 mg)) 180 tablet 5 08/10/2021   lamoTRIgine (LAMICTAL) 25 MG tablet Take 2 tablets (50 mg total) by mouth 2 (two) times daily along with 300 mg 2 (two) times daily. Total of 350 mg twice daily (300 mg+ 50 mg) (Patient taking differently: Take 50 mg by mouth See admin instructions. Take 2 tablets (50 mg total) by mouth 2 (two) times daily along with 300 mg 2 (two) times daily. Total of 350 mg twice daily (300 mg+ 50 mg)) 120 tablet 5 08/10/2021   levothyroxine (SYNTHROID, LEVOTHROID) 50 MCG tablet Take 50 mcg by mouth every evening.   08/10/2021   OLANZapine zydis (  ZYPREXA) 5 MG disintegrating tablet Take 5 mg by mouth 2 (two) times daily.   08/10/2021   omeprazole (PRILOSEC) 20 MG capsule Take 20 mg by mouth at bedtime.   08/10/2021   promethazine (PHENERGAN) 25 MG tablet Take 25 mg by mouth every 8 (eight) hours as needed for nausea or vomiting.   unknown   venlafaxine XR (EFFEXOR-XR) 150 MG 24 hr capsule Take 1 capsule (150 mg total) by mouth daily with breakfast. 90 capsule 1 08/10/2021   warfarin (COUMADIN) 5 MG tablet Take 1.5-2 tablets (7.5-10 mg total) by mouth See admin instructions. Pt takes one and a half tablets (7.5mg  total) on Sunday and Wednesday and 2 tablets (10mg  total) all other days. (Patient taking differently: Take 7.5-10 mg by mouth See admin instructions. 7.5mg  on Sundays and 10mg  on all other days or as directed.)   08/09/2021 at 10:00am   levofloxacin (LEVAQUIN) 250 MG tablet Take 1 tablet (250 mg total) by mouth daily. (Patient not taking: Reported on 08/11/2021) 1 tablet 0 Not Taking    Assessment: 48 yof with hx brain aneurysm rupture  and ICH s/p VP shunt, functional quadriplegia/bedbound, chronic dysphagia on pureed diet, seizure disorder, DVT, lupus anticoagulant on chronic warfarin presenting with blood in diaper, unclear source. INR >10 on presentation. Noted recently on Levaquin for infection but off for several weeks now PTA. Pharmacy consulted to dose warfarin inpatient.   INR subtherapeutic at 1.5 today. No bleeding noted, Hgb down 7.9, platelets are normal. Urology notes hematuria has resolved. Inability to keep up with PO nutrition likely contributed to high INR on admit.  Goal of Therapy:  INR 2-3 Monitor platelets by anticoagulation protocol: Yes   Plan:   Continue holding warfarin per discussion with Dr Mahala Menghini - patient waiting on PEG placement, date TBD (most likely early next week per chart review) Lovenox 40 mg SQ q24h for VTE prophylaxis - will need to hold dose day before procedure (end date added to current enoxaparin order) Check INR daily while on warfarin Continue to monitor H&H and platelets  F/U plan for anticoag post-IR    Thank you for allowing pharmacy to be a part of this patient's care.  Thelma Barge, PharmD Clinical Pharmacist

## 2021-08-15 DIAGNOSIS — R58 Hemorrhage, not elsewhere classified: Secondary | ICD-10-CM | POA: Diagnosis not present

## 2021-08-15 LAB — HEMOGLOBIN AND HEMATOCRIT, BLOOD
HCT: 21.1 % — ABNORMAL LOW (ref 36.0–46.0)
HCT: 26.4 % — ABNORMAL LOW (ref 36.0–46.0)
Hemoglobin: 6.6 g/dL — CL (ref 12.0–15.0)
Hemoglobin: 8.1 g/dL — ABNORMAL LOW (ref 12.0–15.0)

## 2021-08-15 LAB — PREPARE RBC (CROSSMATCH)

## 2021-08-15 LAB — CBC
HCT: 21.8 % — ABNORMAL LOW (ref 36.0–46.0)
Hemoglobin: 6.8 g/dL — CL (ref 12.0–15.0)
MCH: 27 pg (ref 26.0–34.0)
MCHC: 31.2 g/dL (ref 30.0–36.0)
MCV: 86.5 fL (ref 80.0–100.0)
Platelets: 295 10*3/uL (ref 150–400)
RBC: 2.52 MIL/uL — ABNORMAL LOW (ref 3.87–5.11)
RDW: 15.3 % (ref 11.5–15.5)
WBC: 6.5 10*3/uL (ref 4.0–10.5)
nRBC: 0 % (ref 0.0–0.2)

## 2021-08-15 MED ORDER — SODIUM CHLORIDE 0.9% IV SOLUTION: Freq: Once | INTRAVENOUS | Status: DC

## 2021-08-15 NOTE — Progress Notes (Signed)
AM hgb below 7 (6.8 )and was confirmed on repeat labs (6.6)-SBP in the low 100s-per RN pt unable to give consent -transfusion deemed medically necessary due to slow downward trend in Hgb and sub optimal BP readings in context of recent gross hematuria. No overt signs of bleeding so will continue lovenox and warfarin due to history of DVT in pt with positive lupus anticoagulant- orders placed to rpt Hgb after transfusion

## 2021-08-16 DIAGNOSIS — R58 Hemorrhage, not elsewhere classified: Secondary | ICD-10-CM | POA: Diagnosis not present

## 2021-08-16 LAB — BPAM RBC
Blood Product Expiration Date: 202306282359
ISSUE DATE / TIME: 202306241207
Unit Type and Rh: 5100

## 2021-08-16 LAB — CULTURE, BLOOD (ROUTINE X 2)
Culture: NO GROWTH
Culture: NO GROWTH
Special Requests: ADEQUATE
Special Requests: ADEQUATE

## 2021-08-16 LAB — TYPE AND SCREEN
ABO/RH(D): O POS
Antibody Screen: NEGATIVE
Unit division: 0

## 2021-08-16 LAB — CBC WITH DIFFERENTIAL/PLATELET
Abs Immature Granulocytes: 0.04 10*3/uL (ref 0.00–0.07)
Basophils Absolute: 0 10*3/uL (ref 0.0–0.1)
Basophils Relative: 0 %
Eosinophils Absolute: 0.1 10*3/uL (ref 0.0–0.5)
Eosinophils Relative: 1 %
HCT: 27.3 % — ABNORMAL LOW (ref 36.0–46.0)
Hemoglobin: 8.7 g/dL — ABNORMAL LOW (ref 12.0–15.0)
Immature Granulocytes: 1 %
Lymphocytes Relative: 26 %
Lymphs Abs: 1.8 10*3/uL (ref 0.7–4.0)
MCH: 27.3 pg (ref 26.0–34.0)
MCHC: 31.9 g/dL (ref 30.0–36.0)
MCV: 85.6 fL (ref 80.0–100.0)
Monocytes Absolute: 0.5 10*3/uL (ref 0.1–1.0)
Monocytes Relative: 7 %
Neutro Abs: 4.6 10*3/uL (ref 1.7–7.7)
Neutrophils Relative %: 65 %
Platelets: 325 10*3/uL (ref 150–400)
RBC: 3.19 MIL/uL — ABNORMAL LOW (ref 3.87–5.11)
RDW: 15.2 % (ref 11.5–15.5)
WBC: 7.1 10*3/uL (ref 4.0–10.5)
nRBC: 0 % (ref 0.0–0.2)

## 2021-08-16 LAB — BASIC METABOLIC PANEL
Anion gap: 11 (ref 5–15)
BUN: 5 mg/dL — ABNORMAL LOW (ref 6–20)
CO2: 27 mmol/L (ref 22–32)
Calcium: 9.4 mg/dL (ref 8.9–10.3)
Chloride: 106 mmol/L (ref 98–111)
Creatinine, Ser: 0.55 mg/dL (ref 0.44–1.00)
GFR, Estimated: >60 mL/min (ref >60)
Glucose, Bld: 114 mg/dL — ABNORMAL HIGH (ref 70–99)
Potassium: 3 mmol/L — ABNORMAL LOW (ref 3.5–5.1)
Sodium: 144 mmol/L (ref 135–145)

## 2021-08-16 LAB — MAGNESIUM: Magnesium: 1.4 mg/dL — ABNORMAL LOW (ref 1.7–2.4)

## 2021-08-16 MED ORDER — POTASSIUM CHLORIDE 10 MEQ/100ML IV SOLN
10.0000 meq | Freq: Once | INTRAVENOUS | Status: AC
  Administered 2021-08-16: 10 meq via INTRAVENOUS
  Filled 2021-08-16: qty 100

## 2021-08-16 MED ORDER — POTASSIUM CHLORIDE CRYS ER 20 MEQ PO TBCR
40.0000 meq | EXTENDED_RELEASE_TABLET | Freq: Once | ORAL | Status: AC
  Administered 2021-08-16: 40 meq via ORAL
  Filled 2021-08-16: qty 2

## 2021-08-16 MED ORDER — MAGNESIUM SULFATE 2 GM/50ML IV SOLN
2.0000 g | Freq: Once | INTRAVENOUS | Status: AC
  Administered 2021-08-16: 2 g via INTRAVENOUS
  Filled 2021-08-16: qty 50

## 2021-08-17 DIAGNOSIS — R58 Hemorrhage, not elsewhere classified: Secondary | ICD-10-CM | POA: Diagnosis not present

## 2021-08-17 LAB — BASIC METABOLIC PANEL
Anion gap: 7 (ref 5–15)
BUN: 6 mg/dL (ref 6–20)
CO2: 29 mmol/L (ref 22–32)
Calcium: 9 mg/dL (ref 8.9–10.3)
Chloride: 104 mmol/L (ref 98–111)
Creatinine, Ser: 0.61 mg/dL (ref 0.44–1.00)
GFR, Estimated: >60 mL/min (ref >60)
Glucose, Bld: 105 mg/dL — ABNORMAL HIGH (ref 70–99)
Potassium: 3.3 mmol/L — ABNORMAL LOW (ref 3.5–5.1)
Sodium: 140 mmol/L (ref 135–145)

## 2021-08-17 LAB — CBC
HCT: 28.2 % — ABNORMAL LOW (ref 36.0–46.0)
Hemoglobin: 9.2 g/dL — ABNORMAL LOW (ref 12.0–15.0)
MCH: 28.1 pg (ref 26.0–34.0)
MCHC: 32.6 g/dL (ref 30.0–36.0)
MCV: 86.2 fL (ref 80.0–100.0)
Platelets: 252 10*3/uL (ref 150–400)
RBC: 3.27 MIL/uL — ABNORMAL LOW (ref 3.87–5.11)
RDW: 15.3 % (ref 11.5–15.5)
WBC: 5.6 10*3/uL (ref 4.0–10.5)
nRBC: 0 % (ref 0.0–0.2)

## 2021-08-17 LAB — PROTIME-INR
INR: 2.3 — ABNORMAL HIGH (ref 0.8–1.2)
Prothrombin Time: 24.9 seconds — ABNORMAL HIGH (ref 11.4–15.2)

## 2021-08-17 LAB — MAGNESIUM: Magnesium: 1.9 mg/dL (ref 1.7–2.4)

## 2021-08-17 MED ORDER — PHYTONADIONE 5 MG PO TABS
5.0000 mg | ORAL_TABLET | Freq: Once | ORAL | Status: AC
  Administered 2021-08-17: 5 mg via ORAL
  Filled 2021-08-17 (×2): qty 1

## 2021-08-17 MED ORDER — POTASSIUM CHLORIDE IN NACL 40-0.9 MEQ/L-% IV SOLN
INTRAVENOUS | Status: DC
  Filled 2021-08-17 (×4): qty 1000

## 2021-08-17 NOTE — Progress Notes (Signed)
PROGRESS NOTE   Cassandra Andrade  JXB:[id number] DOB: [birth year] DOA: 08/11/2021 PCP: Iona Hansen, NP  Brief Narrative:   56 year old with known brain aneurysm rupture +IC/R MCA normal underlying wall hemorrhage status post VP shunt 2004 Lupus anticoagulant on Coumadin Intractable epilepsy previously followed by Dr. 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 Last admitted 5/23 through 07/20/2021 with acute metabolic encephalopathy thought to be secondary to Pseudomonas UTI superimposed on prior hemorrhage--- neurology was consulted at the time--patient was discharged on clonazepam 1 mg twice daily seizure Lamictal 350 twice daily Vimpat 200 twice daily by fact 100 twice daily and has had prior hallucinations on Onfi 10 twice daily  Post last hopositlaization made significant decline and has become more nonverbal whereas prior she was able to speak and follow commands  Brought to ED 08/11/2021 secondary to possible vaginal versus rectal bleeding in addition to ongoing confusion, pocketing medications-because is on Coumadin concern with regards to acute bleed  Tmax 99.7 pulse rate 101 CT head no acute abnormality extensive encephalomalacia related to right craniotomy Hemoglobin 9 sodium 146 calcium 10.4 lactic acid 1.2 INR greater than 10 CT abdomen pelvis no acute abnormality cholelithiasis  urine culture collected Rx vitamin K Tylenol LR 1 L  Pelvic ultrasound showed nondiagnostic-small portion of endometrium visualized 7 mm considered abnormal-not a very good study repeat examination was recommended  GYN telephone consulted because of abnormal endometrial stripe of 7 mm--determining cause by placing Foley catheter--placing a tampon and calling back if any gyn bleed [none so far]  Foley showed hematuria Urology consulted as felt to be predominantly hematuria which eventually was  self-limiting and stopped--will need OP cystoscopy  Because of poor p.o. intake is requiring PEG tube placement in the next several days-we will rechallenge her with Lovenox  She will go for PEG tube 6/25 and we will decide on d/c once Toleracne feeds can be ascertained   Hospital-Problem based course  Hematuria No GYN/GI bleed Hematuria is cleared appreciate Dr. Laverle Patter input--Foley removed 6/22 in and out catheterization 6/24 --> no further bleeding and patient has not had any GI bleed or vaginal bleed Will need outpatient evaluation for work-up of hematuria and for abn endometrial stripe No source of bleeding feel this is dilutional anemia Hemoglobin stable dropped to 6.6--decision made 6/23 to transfuse 1 unit PRBC--hemoglobin stable Lovenox held for procedure ?Thrush Continue IV fluconazole is not eating a whole lot Left lower extremity DVT in the past, lupus anticoagulant previously on Coumadin Prior Stroke Lovenox initiated 6/23 but held for procedure and for risk bleed Check in in am--need below 1.5 tomorrow for procedure Vit K 5 po given 2/26 R MCA aneurysmal rupture status post VP shunt 2004, prior stroke--functional quadriplegia Chronic dysphagia on pured diet Moderate appetite not eating and is unable to keep up with nutrition SLP saw the patient 6/23 and recommended downgrading to n.p.o. as patient is pocketing foods-- remains on a dysphagia 1 diet -- appetite improved Awaiting PEG tube to be placed 6/26 Lovenox PM  held Intractable epilepsy previously followed by Dr. 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 Surgical cure-Will need outpatient follow-up Stage II decubitus ulcer present on admission Place sacral dressing and protective   DVT prophylaxis: SCDs at this time Code Status: Full Family Communication: called to d/w mother Lucia Gaskins [phone] on 08/13/2021 Disposition:  Status is: Observation The patient will require care spanning > 2 midnights and should be moved to inpatient because:   Consultants:   Urology  Procedures: unclear  Antimicrobials:     Subjective:  Overall well no distress no fever no issues Procedure cancelled because INR elevated   Objective: Vitals:   08/17/21 0420 08/17/21 0648 08/17/21 0700 08/17/21 0808  BP: 125/87   124/73  Pulse: 77 77  85  Resp: 16   18  Temp: 98.2 F (36.8 C)   (!) 97.5 F (36.4 C)  TempSrc: Oral   Oral  SpO2: 100% 100%  100%  Weight:   63.7 kg     Intake/Output Summary (Last 24 hours) at 08/17/2021 1421 Last data filed at 08/17/2021 0800 Gross per 24 hour  Intake --  Output 500 ml  Net -500 ml    Filed Weights   08/17/21 0700  Weight: 63.7 kg    Examination:  More coherent--difficult to understand speech some repetition remnant tracheotomy scar S1-S2 no murmur rub gallop Abdomen is soft no rebound--has old PEG scar GU has no bleed No lower extremity edema  Data Reviewed: personally reviewed   Hemoglobin down from 10.2---> 7.9-->6.6--transfusing 1 unit PRBC 6/24-->9.2 K 3.3  Radiology Studies: No results found.   Scheduled Meds:  sodium chloride   Intravenous Once   Chlorhexidine Gluconate Cloth  6 each Topical Daily   fluconazole  100 mg Oral Daily   [START ON 08/20/2021] levothyroxine  25 mcg Intravenous Daily   OLANZapine zydis  5 mg Oral BID   mouth rinse  15 mL Mouth Rinse 4 times per day   pantoprazole  40 mg Oral Daily   phytonadione  5 mg Oral Once   venlafaxine XR  150 mg Oral Q breakfast   Continuous  Infusions:  0.9 % NaCl with KCl 40 mEq / L 75 mL/hr at 08/17/21 1056   brivaracetam (BRIVIACT) 100 mg in sodium chloride 0.9 % 50 mL 100 mg (08/17/21 1157)   lacosamide (VIMPAT) IV 200 mg (08/17/21 1127)     LOS: 5 days   Time spent: 30  Rhetta Mura, MD [facility] To contact the attending provider between 7A-7P or the covering provider during after hours 7P-7A, please log into the web site [email or web address] and access using universal [place] password for that web site. If you do not have the password, please call the hospital operator.  08/17/2021, 2:21 PM

## 2021-08-17 NOTE — Progress Notes (Signed)
Patient planned for G-tube placement today in IR - AM labs show INR 2.4.  INR will need to be < 1.5 to proceed with G-tube placement due to high risk of bleeding. Will repeat INR AM of 6/27 and determine if patient appropriate for placement.  Plan: - May restart diet/tube feeds today then NPO/hold tube feeds at midnight 6/27 - Repeat INR AM of 6/27  Please call with questions or concerns.  Lynnette Caffey, PA-C

## 2021-08-18 DIAGNOSIS — R58 Hemorrhage, not elsewhere classified: Secondary | ICD-10-CM | POA: Diagnosis not present

## 2021-08-18 LAB — BASIC METABOLIC PANEL
Anion gap: 9 (ref 5–15)
BUN: <5 mg/dL — ABNORMAL LOW (ref 6–20)
CO2: 27 mmol/L (ref 22–32)
Calcium: 9.2 mg/dL (ref 8.9–10.3)
Chloride: 106 mmol/L (ref 98–111)
Creatinine, Ser: 0.68 mg/dL (ref 0.44–1.00)
GFR, Estimated: >60 mL/min (ref >60)
Glucose, Bld: 116 mg/dL — ABNORMAL HIGH (ref 70–99)
Potassium: 4 mmol/L (ref 3.5–5.1)
Sodium: 142 mmol/L (ref 135–145)

## 2021-08-18 LAB — CBC
HCT: 25.9 % — ABNORMAL LOW (ref 36.0–46.0)
Hemoglobin: 8.2 g/dL — ABNORMAL LOW (ref 12.0–15.0)
MCH: 27.2 pg (ref 26.0–34.0)
MCHC: 31.7 g/dL (ref 30.0–36.0)
MCV: 85.8 fL (ref 80.0–100.0)
Platelets: 346 10*3/uL (ref 150–400)
RBC: 3.02 MIL/uL — ABNORMAL LOW (ref 3.87–5.11)
RDW: 15.3 % (ref 11.5–15.5)
WBC: 6.7 10*3/uL (ref 4.0–10.5)
nRBC: 0 % (ref 0.0–0.2)

## 2021-08-18 LAB — PROTIME-INR
INR: 1.7 — ABNORMAL HIGH (ref 0.8–1.2)
Prothrombin Time: 19.8 seconds — ABNORMAL HIGH (ref 11.4–15.2)

## 2021-08-18 MED ORDER — VITAMIN K1 10 MG/ML IJ SOLN
5.0000 mg | Freq: Once | INTRAVENOUS | Status: DC
  Filled 2021-08-18: qty 0.5

## 2021-08-18 MED ORDER — PHYTONADIONE 5 MG PO TABS
2.5000 mg | ORAL_TABLET | Freq: Once | ORAL | Status: AC
  Administered 2021-08-18: 2.5 mg via ORAL
  Filled 2021-08-18 (×2): qty 1

## 2021-08-18 MED ORDER — CEFAZOLIN SODIUM-DEXTROSE 2-4 GM/100ML-% IV SOLN
2.0000 g | INTRAVENOUS | Status: AC
  Filled 2021-08-18: qty 100

## 2021-08-18 MED ORDER — ENOXAPARIN SODIUM 60 MG/0.6ML IJ SOSY
60.0000 mg | PREFILLED_SYRINGE | Freq: Two times a day (BID) | INTRAMUSCULAR | Status: AC
Start: 2021-08-18 — End: 2021-08-18
  Administered 2021-08-18 (×2): 60 mg via SUBCUTANEOUS
  Filled 2021-08-18 (×2): qty 0.6

## 2021-08-18 NOTE — Progress Notes (Signed)
   Was tentatively scheduled for G tube in IR today INR still at 1.7 this am Would like to be 1.5 or lower per Dr Lowella Dandy  Will recheck INR in  am  New orders placed

## 2021-08-19 ENCOUNTER — Inpatient Hospital Stay (HOSPITAL_COMMUNITY): Payer: Medicare Other

## 2021-08-19 DIAGNOSIS — R58 Hemorrhage, not elsewhere classified: Secondary | ICD-10-CM | POA: Diagnosis not present

## 2021-08-19 HISTORY — PX: IR GASTROSTOMY TUBE MOD SED: IMG625

## 2021-08-19 LAB — CBC WITH DIFFERENTIAL/PLATELET
Abs Immature Granulocytes: 0.04 10*3/uL (ref 0.00–0.07)
Basophils Absolute: 0 10*3/uL (ref 0.0–0.1)
Basophils Relative: 0 %
Eosinophils Absolute: 0 10*3/uL (ref 0.0–0.5)
Eosinophils Relative: 0 %
HCT: 27.9 % — ABNORMAL LOW (ref 36.0–46.0)
Hemoglobin: 9 g/dL — ABNORMAL LOW (ref 12.0–15.0)
Immature Granulocytes: 0 %
Lymphocytes Relative: 10 %
Lymphs Abs: 1.1 10*3/uL (ref 0.7–4.0)
MCH: 27.6 pg (ref 26.0–34.0)
MCHC: 32.3 g/dL (ref 30.0–36.0)
MCV: 85.6 fL (ref 80.0–100.0)
Monocytes Absolute: 0.6 10*3/uL (ref 0.1–1.0)
Monocytes Relative: 6 %
Neutro Abs: 8.8 10*3/uL — ABNORMAL HIGH (ref 1.7–7.7)
Neutrophils Relative %: 84 %
Platelets: 400 10*3/uL (ref 150–400)
RBC: 3.26 MIL/uL — ABNORMAL LOW (ref 3.87–5.11)
RDW: 15.3 % (ref 11.5–15.5)
WBC: 10.5 10*3/uL (ref 4.0–10.5)
nRBC: 0 % (ref 0.0–0.2)

## 2021-08-19 LAB — BASIC METABOLIC PANEL
Anion gap: 13 (ref 5–15)
Anion gap: 16 — ABNORMAL HIGH (ref 5–15)
BUN: <5 mg/dL — ABNORMAL LOW (ref 6–20)
BUN: 5 mg/dL — ABNORMAL LOW (ref 6–20)
CO2: 25 mmol/L (ref 22–32)
CO2: 22 mmol/L (ref 22–32)
Calcium: 9.7 mg/dL (ref 8.9–10.3)
Calcium: 9.4 mg/dL (ref 8.9–10.3)
Chloride: 101 mmol/L (ref 98–111)
Chloride: 100 mmol/L (ref 98–111)
Creatinine, Ser: 0.66 mg/dL (ref 0.44–1.00)
Creatinine, Ser: 0.67 mg/dL (ref 0.44–1.00)
GFR, Estimated: >60 mL/min (ref >60)
GFR, Estimated: >60 mL/min (ref >60)
Glucose, Bld: 91 mg/dL (ref 70–99)
Glucose, Bld: 111 mg/dL — ABNORMAL HIGH (ref 70–99)
Potassium: 3.9 mmol/L (ref 3.5–5.1)
Potassium: 4.2 mmol/L (ref 3.5–5.1)
Sodium: 139 mmol/L (ref 135–145)
Sodium: 138 mmol/L (ref 135–145)

## 2021-08-19 LAB — CBC
HCT: 29.7 % — ABNORMAL LOW (ref 36.0–46.0)
Hemoglobin: 9.3 g/dL — ABNORMAL LOW (ref 12.0–15.0)
MCH: 27.1 pg (ref 26.0–34.0)
MCHC: 31.3 g/dL (ref 30.0–36.0)
MCV: 86.6 fL (ref 80.0–100.0)
Platelets: 389 10*3/uL (ref 150–400)
RBC: 3.43 MIL/uL — ABNORMAL LOW (ref 3.87–5.11)
RDW: 15.5 % (ref 11.5–15.5)
WBC: 7.8 10*3/uL (ref 4.0–10.5)
nRBC: 0 % (ref 0.0–0.2)

## 2021-08-19 LAB — PROTIME-INR
INR: 1.3 — ABNORMAL HIGH (ref 0.8–1.2)
Prothrombin Time: 16.2 seconds — ABNORMAL HIGH (ref 11.4–15.2)

## 2021-08-19 LAB — GLUCOSE, CAPILLARY: Glucose-Capillary: 128 mg/dL — ABNORMAL HIGH (ref 70–99)

## 2021-08-19 MED ORDER — MIDAZOLAM HCL 2 MG/2ML IJ SOLN
INTRAMUSCULAR | Status: AC | PRN
  Administered 2021-08-19 (×2): 1 mg via INTRAVENOUS

## 2021-08-19 MED ORDER — METOPROLOL TARTRATE 5 MG/5ML IV SOLN
2.5000 mg | Freq: Four times a day (QID) | INTRAVENOUS | Status: DC | PRN
  Administered 2021-08-19 – 2021-08-21 (×5): 2.5 mg via INTRAVENOUS
  Filled 2021-08-19 (×5): qty 5

## 2021-08-19 MED ORDER — FENTANYL CITRATE (PF) 100 MCG/2ML IJ SOLN
INTRAMUSCULAR | Status: AC | PRN
  Administered 2021-08-19: 50 ug via INTRAVENOUS

## 2021-08-19 MED ORDER — ENOXAPARIN SODIUM 60 MG/0.6ML IJ SOSY
60.0000 mg | PREFILLED_SYRINGE | Freq: Two times a day (BID) | INTRAMUSCULAR | Status: DC
Start: 2021-08-19 — End: 2021-08-22
  Administered 2021-08-19 – 2021-08-21 (×4): 60 mg via SUBCUTANEOUS
  Filled 2021-08-19 (×4): qty 0.6

## 2021-08-19 MED ORDER — LIDOCAINE HCL 1 % IJ SOLN
INTRAMUSCULAR | Status: AC
  Filled 2021-08-19: qty 20

## 2021-08-19 MED ORDER — ONDANSETRON HCL 4 MG/2ML IJ SOLN: 4.0000 mg | INTRAMUSCULAR | Status: DC | PRN

## 2021-08-19 MED ORDER — IOHEXOL 300 MG/ML  SOLN
100.0000 mL | Freq: Once | INTRAMUSCULAR | Status: AC | PRN
  Administered 2021-08-19: 15 mL

## 2021-08-19 MED ORDER — DEXTROSE IN LACTATED RINGERS 5 % IV SOLN: INTRAVENOUS | Status: DC

## 2021-08-19 MED ORDER — MIDAZOLAM HCL 2 MG/2ML IJ SOLN
INTRAMUSCULAR | Status: AC
  Filled 2021-08-19: qty 2

## 2021-08-19 MED ORDER — GLUCAGON HCL RDNA (DIAGNOSTIC) 1 MG IJ SOLR
INTRAMUSCULAR | Status: AC
  Filled 2021-08-19: qty 1

## 2021-08-19 MED ORDER — GLUCAGON HCL RDNA (DIAGNOSTIC) 1 MG IJ SOLR
INTRAMUSCULAR | Status: AC | PRN
  Administered 2021-08-19: .5 mg via INTRAVENOUS

## 2021-08-19 MED ORDER — FENTANYL CITRATE (PF) 100 MCG/2ML IJ SOLN
INTRAMUSCULAR | Status: AC
  Filled 2021-08-19: qty 2

## 2021-08-19 MED ORDER — CEFAZOLIN SODIUM-DEXTROSE 2-4 GM/100ML-% IV SOLN
INTRAVENOUS | Status: AC
  Administered 2021-08-19: 2 g via INTRAVENOUS
  Filled 2021-08-19: qty 100

## 2021-08-19 NOTE — Sedation Documentation (Signed)
Attempted to call report to nurse post procedure. Nurse states she can not take report at the moment because she is in progression.

## 2021-08-19 NOTE — Plan of Care (Signed)
  Problem: Education: Goal: Knowledge of General Education information will improve Description: Including pain rating scale, medication(s)/side effects and non-pharmacologic comfort measures Outcome: Not Progressing   Problem: Health Behavior/Discharge Planning: Goal: Ability to manage health-related needs will improve Outcome: Not Progressing   Problem: Clinical Measurements: Goal: Ability to maintain clinical measurements within normal limits will improve Outcome: Progressing Goal: Will remain free from infection Outcome: Progressing Goal: Diagnostic test results will improve Outcome: Progressing Goal: Respiratory complications will improve Outcome: Progressing Goal: Cardiovascular complication will be avoided Outcome: Progressing   Problem: Activity: Goal: Risk for activity intolerance will decrease Outcome: Not Progressing   Problem: Nutrition: Goal: Adequate nutrition will be maintained Outcome: Progressing   Problem: Coping: Goal: Level of anxiety will decrease Outcome: Progressing   Problem: Pain Managment: Goal: General experience of comfort will improve Outcome: Progressing   Problem: Safety: Goal: Ability to remain free from injury will improve Outcome: Progressing   Problem: Skin Integrity: Goal: Risk for impaired skin integrity will decrease Outcome: Progressing

## 2021-08-19 NOTE — Progress Notes (Signed)
Speech Language Pathology Treatment: Dysphagia  Patient Details Name: Cassandra Andrade MRN: [id number] DOB: [birth year] Today's Date: 08/19/2021 Time: 0051-1021 SLP Time Calculation (min) (ACUTE ONLY): 12 min  Assessment / Plan / Recommendation Clinical Impression  Pt was seen for dysphagia treatment. She was alert and cooperative during the session. Pt's swallow function today appears more representative of that noted on 07/14/21 during her prior admission. Pt tolerated puree solids and thin liquids via straw without overt s/sx of aspiration. Brief oral holding was noted occasionally with puree, but oral clearance was WNL and no symptoms of pharyngeal residue demonstrated. A dysphagia 1 diet with thin liquids will be initiated at this time. SLP will continue to follow pt.     HPI HPI: Patient is a 56 y.o. 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 Patient recently admitted 5/23-5/29/23 with acute metabolic encephalopathy. Patient presented to the hospital on 08/11/21 and mother reported during current admission that patient has not been eating much since being discharged, has not had a BM in past couple of days, intermittently refusing pills/holding pills in mouth. In addition, mother noticing blood when changing patient's pull up diaper. In ED, patient with temperature of 99.7 degrees F, RR and oxygen saturations WFL. CT head did not reveal any acute abnormality with extensive encephalomalacia related to prior craniotomy. G-tube placed 6/28      SLP Plan  Continue with current plan of care      Recommendations for follow up therapy are one component of a multi-disciplinary discharge planning process, led by the attending physician.  Recommendations may be updated based  on patient status, additional functional criteria and insurance authorization.    Recommendations  Diet recommendations: Dysphagia 1 (puree);Thin liquid Liquids provided via: Straw;Cup Medication Administration: Whole meds with puree (whole/crushed in puree as tolerated) Supervision: Full supervision/cueing for compensatory strategies;Staff to assist with self feeding Compensations: Minimize environmental distractions Postural Changes and/or Swallow Maneuvers: Seated upright 90 degrees                Oral Care Recommendations: Oral care BID Follow Up Recommendations: Skilled nursing-short term rehab (<3 hours/day) Assistance recommended at discharge: Frequent or constant Supervision/Assistance SLP Visit Diagnosis: Dysphagia, unspecified (R13.10) Plan: Continue with current plan of care         Jasmine Mcbeth I. Hardin Negus, [place], [place] Office number [phone] Pager [place]  08/19/2021, 11:20 AM

## 2021-08-19 NOTE — TOC Progression Note (Signed)
Transition of Care [facility]) - Progression Note    Patient Details  Name: Cassandra Andrade MRN: [id number] Date of Birth: February 03, 1966  Transition of [facility] CM/SW Contact  Carles Collet, RN Phone Number: 08/19/2021, 12:21 PM  Clinical Narrative:    G tube placed this morning. Will be cleared by IR tomorrow for use with feeds.  Awaiting dietician recommendations for feeding regimen and will order feeds and equipment accordingly. [place]    Expected Discharge Plan: [place]    Expected Discharge Plan and Services Expected Discharge Plan: [place]   Discharge Planning Services: CM Consult Post Acute Care Choice: [place] arrangements for the past 2 months: Single Family Home                 DME Arranged: Tube feeding         HH Arranged: RN           Social Determinants of Health (SDOH) Interventions    Readmission Risk Interventions     No data to display

## 2021-08-19 NOTE — Progress Notes (Signed)
Patient transported off unit to radiology by transportation staff via bed

## 2021-08-19 NOTE — Progress Notes (Signed)
Patient ok for oral and crushed meds in puree as tolerated. She will be on dysphagia 1 diet and thin liquids per [facility],CCC-SLP. I will give 10am oral meds now

## 2021-08-19 NOTE — Progress Notes (Signed)
[place] for warfarin>lovenox Indication:  DVT, lupus anticoagulant  Allergies  Allergen Reactions   Vicodin [Hydrocodone-Acetaminophen] Nausea And Vomiting   Morphine Rash   Morphine And Related Rash    Allergic reaction only in iv form    Vital Signs: Temp: 98.3 F (36.8 C) (06/28 0940) Temp Source: Oral (06/28 0940) BP: 152/89 (06/28 0940) Pulse Rate: 110 (06/28 1009)  Labs: Recent Labs    08/17/21 0110 08/17/21 0558 08/18/21 0213 08/19/21 0220  HGB 9.2*  --  8.2* 9.3*  HCT 28.2*  --  25.9* 29.7*  PLT 252  --  346 389  LABPROT  --  24.9* 19.8* 16.2*  INR  --  2.3* 1.7* 1.3*  CREATININE  --  0.61 0.68 0.66     Estimated Creatinine Clearance: 75.3 mL/min (by C-G formula based on SCr of 0.66 mg/dL).   Medications:  Medications Prior to Admission  Medication Sig Dispense Refill Last Dose   acetaminophen (TYLENOL) 650 MG CR tablet Take 650 mg by mouth every 8 (eight) hours as needed for pain.   unknown   Ascorbic Acid (VITAMIN C) 1000 MG tablet Take 1,000 mg by mouth daily.   08/10/2021   BRIVIACT 100 MG TABS tablet Take 100 mg by mouth 2 (two) times daily.   08/10/2021   clonazePAM (KLONOPIN) 1 MG tablet One tablet twice daily as needed for seizure. (Patient taking differently: Take 1 mg by mouth 2 (two) times daily as needed (seizures). One tablet twice daily as needed for seizure.) 30 tablet 5 unknown   ketoconazole (NIZORAL) 2 % shampoo Apply 1 application topically 2 (two) times a week. Monday,wednesday   Past Week   lacosamide (VIMPAT) 200 MG TABS tablet Take 1 tablet (200 mg total) by mouth 2 (two) times daily. 180 tablet 5 08/10/2021   lamoTRIgine (LAMICTAL) 100 MG tablet Take 3 tablets (300 mg total) by mouth 2 (two) times daily along with 50 mg 2 (two) times daily Total of 350 mg twice daily (300 mg+ 50 mg) (Patient taking differently: Take 300 mg by mouth See admin instructions. Take 3 tablets (300 mg total) by mouth 2 (two)  times daily along with 50 mg 2 (two) times daily Total of 350 mg twice daily (300 mg+ 50 mg)) 180 tablet 5 08/10/2021   lamoTRIgine (LAMICTAL) 25 MG tablet Take 2 tablets (50 mg total) by mouth 2 (two) times daily along with 300 mg 2 (two) times daily. Total of 350 mg twice daily (300 mg+ 50 mg) (Patient taking differently: Take 50 mg by mouth See admin instructions. Take 2 tablets (50 mg total) by mouth 2 (two) times daily along with 300 mg 2 (two) times daily. Total of 350 mg twice daily (300 mg+ 50 mg)) 120 tablet 5 08/10/2021   levothyroxine (SYNTHROID, LEVOTHROID) 50 MCG tablet Take 50 mcg by mouth every evening.   08/10/2021   OLANZapine zydis (ZYPREXA) 5 MG disintegrating tablet Take 5 mg by mouth 2 (two) times daily.   08/10/2021   omeprazole (PRILOSEC) 20 MG capsule Take 20 mg by mouth at bedtime.   08/10/2021   promethazine (PHENERGAN) 25 MG tablet Take 25 mg by mouth every 8 (eight) hours as needed for nausea or vomiting.   unknown   venlafaxine XR (EFFEXOR-XR) 150 MG 24 hr capsule Take 1 capsule (150 mg total) by mouth daily with breakfast. 90 capsule 1 08/10/2021   warfarin (COUMADIN) 5 MG tablet Take 1.5-2 tablets (7.5-10 mg total) by mouth  See admin instructions. Pt takes one and a half tablets (7.'5mg'$  total) on Sunday and Wednesday and 2 tablets ('10mg'$  total) all other days. (Patient taking differently: Take 7.5-10 mg by mouth See admin instructions. 7.'5mg'$  on Sundays and '10mg'$  on all other days or as directed.)   08/09/2021 at 10:00am   levofloxacin (LEVAQUIN) 250 MG tablet Take 1 tablet (250 mg total) by mouth daily. (Patient not taking: Reported on 08/11/2021) 1 tablet 0 Not Taking    Assessment: 37 yof with hx brain aneurysm rupture and ICH s/p VP shunt, functional quadriplegia/bedbound, chronic dysphagia on pureed diet, seizure disorder, DVT, lupus anticoagulant on chronic warfarin presenting with blood in diaper, unclear source. INR >10 on presentation. Noted recently on Levaquin for infection  but off for several weeks now PTA. Pt also on diflucan this admission. Warfarin held and reversed with vit K on 6/26 and 6/27 so G-tube could be placed by IR. This was done 6/28. INR down to 1.3 currently. Hgb stable today.  Plan to restart Lovenox bridge today (first dose 12 hours post procedure) - procedure completed ~0900.  Goal of Therapy:  INR 2-3; anti-Xa level (4 hours post dose) 0.6-1 units/ml Monitor platelets by anticoagulation protocol: Yes   Plan:  Lovenox '1mg'$ /kg (60 mg) SQ every 12 hours Monitor renal function, CBC, s/s bleeding F/u restart of warfarin  Sherlon Handing, PharmD, BCPS Please see amion for complete clinical pharmacist phone list 08/19/2021 3:26 PM

## 2021-08-19 NOTE — Procedures (Signed)
  Procedure:  Perc gastrostomy catheter placement 53fPreprocedure diagnosis: Post CVA, needs enteral feeding support  Postprocedure diagnosis: same EBL:    minimal Complications:   none immediate  See full dictation in [facility]  DDillard CannonMD Main # [phone]  [phone] [phone]

## 2021-08-19 NOTE — Hospital Course (Addendum)
56 year old female with past medical history of aneurysm of the rupture SP VP shunt in 2004, lupus anticoagulant on Coumadin, seizures, chronic dysphagia, functional quadriplegia, DVT, right BKA presents to the hospital with complaints of confusion, GI/vaginal bleeding.  Also has dysphagia requiring PEG tube placement.

## 2021-08-19 NOTE — Progress Notes (Signed)
HR 130 PRN metoprolol given at 1806

## 2021-08-19 NOTE — Progress Notes (Signed)
  Progress Note Patient: Cassandra Andrade VOZ:[id number] DOB: [birth year] DOA: 08/11/2021  DOS: the patient was seen and examined on 08/19/2021  Brief hospital course: 56 year old female with past medical history of aneurysm of the rupture SP VP shunt in 2004, lupus anticoagulant on Coumadin, seizures, chronic dysphagia, functional quadriplegia, DVT, right BKA presents to the hospital with complaints of confusion, GI/vaginal bleeding.  Most of her dysphagia issue requiring PEG tube placement. Assessment and Plan: Supratherapeutic INR. Hematuria. Hemoglobin dropped to 6.6. SP 1 PRBC transfusion. Currently hemoglobin stable. INR normalized with vitamin K. Monitor.  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 Functional quadriplegia. PT OT following.  Chronic dysphagia SP PEG tube placement.  History of DVT in the past. Lupus anticoagulant positive. On chronic Coumadin. We will resume Lovenox. Once the patient's feeding tube can be used we will transition to oral Coumadin as well.  Hypokalemia. Corrected.  Sinus tachycardia. Most likely pain related.  Hemoglobin stable. Monitor for now.  Mood disorder. Severely agitated. We will continue current regimen and monitor.  Hypothyroidism Continue Synthroid.  Seizure disorder No evidence of active seizures. Monitor. LTM negative.  Agitation. Possibly pain related. We will resume home medication and monitor.  Subjective: Seen after the procedure.  Somewhat agitated.  No nausea or vomiting.  Still able to follow commands.  Physical Exam: Vitals:   08/19/21 1009 08/19/21 1743 08/19/21 1744 08/19/21 1821  BP:  (!) 141/84    Pulse: (!) 110 (!) 46 (!) 131 (!) 119  Resp:  17    Temp:  99.4 F (37.4 C)    TempSrc:  Oral    SpO2:  (!) 84% 99%   Weight:       General: Appear in mild distress; no visible Abnormal Neck Mass Or lumps, Conjunctiva normal Cardiovascular: S1 and S2 Present, no Murmur, Respiratory:  increased respiratory effort, Bilateral Air entry present and difficult to assess for crackles, and wheezes Abdomen: Bowel Sound present, Non tender Extremities: no Pedal edema, right BKA Neurology: alert and not oriented to time, place, and person incoherent speech but able to follow commands Gait not checked due to patient safety concerns   Data Reviewed: I have Reviewed nursing notes, Vitals, and Lab results since pt's last encounter. Pertinent lab results CBC and BMP I have ordered test including CBC and BMP and magnesium I have independently visualized and interpreted EKG which showed EKG: nonspecific ST and T waves changes, sinus tachycardia. I have discussed pt's care plan and test results with IR.   Family Communication: None at bedside  Disposition: Status is: Inpatient Remains inpatient appropriate because: Tachycardic, agitated, need to initiate tube feeding and tolerate at goal rate.  Also need education for tube feedings. Author: Berle Mull, MD 08/19/2021 7:14 PM  Please look on [email or web address] to find out who is on call.

## 2021-08-20 ENCOUNTER — Inpatient Hospital Stay (HOSPITAL_COMMUNITY): Payer: Medicare Other

## 2021-08-20 DIAGNOSIS — R58 Hemorrhage, not elsewhere classified: Secondary | ICD-10-CM | POA: Diagnosis not present

## 2021-08-20 LAB — CBC
HCT: 24.9 % — ABNORMAL LOW (ref 36.0–46.0)
Hemoglobin: 8.3 g/dL — ABNORMAL LOW (ref 12.0–15.0)
MCH: 27.7 pg (ref 26.0–34.0)
MCHC: 33.3 g/dL (ref 30.0–36.0)
MCV: 83 fL (ref 80.0–100.0)
Platelets: 246 10*3/uL (ref 150–400)
RBC: 3 MIL/uL — ABNORMAL LOW (ref 3.87–5.11)
RDW: 15.3 % (ref 11.5–15.5)
WBC: 10.4 10*3/uL (ref 4.0–10.5)
nRBC: 0 % (ref 0.0–0.2)

## 2021-08-20 LAB — BASIC METABOLIC PANEL
Anion gap: 11 (ref 5–15)
BUN: <5 mg/dL — ABNORMAL LOW (ref 6–20)
CO2: 21 mmol/L — ABNORMAL LOW (ref 22–32)
Calcium: 8.8 mg/dL — ABNORMAL LOW (ref 8.9–10.3)
Chloride: 104 mmol/L (ref 98–111)
Creatinine, Ser: 0.58 mg/dL (ref 0.44–1.00)
GFR, Estimated: >60 mL/min (ref >60)
Glucose, Bld: 162 mg/dL — ABNORMAL HIGH (ref 70–99)
Potassium: 3.5 mmol/L (ref 3.5–5.1)
Sodium: 136 mmol/L (ref 135–145)

## 2021-08-20 LAB — GLUCOSE, CAPILLARY
Glucose-Capillary: 211 mg/dL — ABNORMAL HIGH (ref 70–99)
Glucose-Capillary: 167 mg/dL — ABNORMAL HIGH (ref 70–99)

## 2021-08-20 LAB — MAGNESIUM: Magnesium: 1.1 mg/dL — ABNORMAL LOW (ref 1.7–2.4)

## 2021-08-20 LAB — PHOSPHORUS: Phosphorus: 3.1 mg/dL (ref 2.5–4.6)

## 2021-08-20 MED ORDER — BRIVARACETAM 100 MG PO TABS: 100.0000 mg | ORAL_TABLET | Freq: Two times a day (BID) | ORAL | Status: DC

## 2021-08-20 MED ORDER — PROSOURCE TF PO LIQD
45.0000 mL | Freq: Two times a day (BID) | ORAL | Status: DC
  Administered 2021-08-20 – 2021-08-24 (×9): 45 mL
  Filled 2021-08-20 (×9): qty 45

## 2021-08-20 MED ORDER — SODIUM CHLORIDE 0.9% FLUSH: 10.0000 mL | INTRAVENOUS | Status: DC | PRN

## 2021-08-20 MED ORDER — LORAZEPAM 2 MG/ML IJ SOLN: 1.0000 mg | Freq: Once | INTRAMUSCULAR | Status: DC

## 2021-08-20 MED ORDER — LORAZEPAM 2 MG/ML IJ SOLN
0.5000 mg | INTRAMUSCULAR | Status: DC | PRN
Start: 2021-08-20 — End: 2021-08-21
  Filled 2021-08-20: qty 1

## 2021-08-20 MED ORDER — MAGNESIUM SULFATE 4 GM/100ML IV SOLN
4.0000 g | Freq: Once | INTRAVENOUS | Status: AC
  Administered 2021-08-20: 4 g via INTRAVENOUS
  Filled 2021-08-20: qty 100

## 2021-08-20 MED ORDER — LORAZEPAM 2 MG/ML IJ SOLN
0.5000 mg | INTRAMUSCULAR | Status: AC
  Administered 2021-08-20: 0.5 mg via INTRAVENOUS
  Filled 2021-08-20: qty 1

## 2021-08-20 MED ORDER — SODIUM CHLORIDE 0.9% FLUSH
10.0000 mL | Freq: Two times a day (BID) | INTRAVENOUS | Status: DC
  Administered 2021-08-20 – 2021-08-25 (×9): 10 mL

## 2021-08-20 MED ORDER — LEVOTHYROXINE SODIUM 50 MCG PO TABS
50.0000 ug | ORAL_TABLET | Freq: Every day | ORAL | Status: DC
  Administered 2021-08-21: 50 ug via ORAL
  Filled 2021-08-20 (×2): qty 1

## 2021-08-20 MED ORDER — SODIUM CHLORIDE 0.9 % IV SOLN
200.0000 mg | INTRAVENOUS | Status: AC
  Administered 2021-08-20: 200 mg via INTRAVENOUS
  Filled 2021-08-20: qty 20

## 2021-08-20 MED ORDER — BRIVARACETAM 100 MG PO TABS
100.0000 mg | ORAL_TABLET | Freq: Two times a day (BID) | ORAL | Status: DC
  Administered 2021-08-20 – 2021-08-25 (×10): 100 mg via ORAL
  Filled 2021-08-20 (×11): qty 1

## 2021-08-20 MED ORDER — THIAMINE HCL 100 MG PO TABS
100.0000 mg | ORAL_TABLET | Freq: Every day | ORAL | Status: AC
Start: 2021-08-21 — End: 2021-08-25
  Administered 2021-08-21 – 2021-08-25 (×5): 100 mg
  Filled 2021-08-20 (×5): qty 1

## 2021-08-20 MED ORDER — SODIUM CHLORIDE 0.9 % IV SOLN
100.0000 mg | INTRAVENOUS | Status: AC
  Administered 2021-08-20: 100 mg via INTRAVENOUS
  Filled 2021-08-20: qty 10

## 2021-08-20 MED ORDER — OSMOLITE 1.5 CAL PO LIQD
1000.0000 mL | ORAL | Status: DC
  Administered 2021-08-20 – 2021-08-23 (×4): 1000 mL
  Filled 2021-08-20 (×5): qty 1000

## 2021-08-20 MED ORDER — FREE WATER
100.0000 mL | Status: DC
  Administered 2021-08-20 – 2021-08-24 (×22): 100 mL

## 2021-08-20 MED ORDER — LACOSAMIDE 50 MG PO TABS
200.0000 mg | ORAL_TABLET | Freq: Two times a day (BID) | ORAL | Status: DC
  Administered 2021-08-20: 200 mg via ORAL
  Filled 2021-08-20: qty 4

## 2021-08-20 NOTE — Progress Notes (Signed)
  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  Most of her dysphagia issue requiring PEG tube placement. Assessment and Plan: Supratherapeutic INR. Hematuria. Hemoglobin dropped to 6.6. SP 1 PRBC transfusion. Currently hemoglobin stable. INR normalized with vitamin K. Monitor.  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 Functional quadriplegia. PT OT following.  Chronic dysphagia SP PEG tube placement.  History of DVT in the past. Lupus anticoagulant positive. On chronic Coumadin. We will resume Lovenox. Once the patient's feeding tube can be used we will transition to oral Coumadin as well.  Hypokalemia. Corrected.  Sinus tachycardia. Most likely pain related.  Hemoglobin stable. Monitor for now.  Fever on 6/29. We will check blood cultures.  Chest x-ray normal.  Check UA.  Mood disorder. Severely agitated. We will continue current regimen and monitor.  Hypothyroidism Continue Synthroid.  Seizure disorder No evidence of active seizures. Monitor. LTM negative.  Patient has missed her morning medication.  We will provide 1 dose of IV medication.  And resume home medication.  Patient was staring with tremors bilaterally which improved significantly with the use of Ativan.  Agitation. Possibly pain related. We will resume home medication and monitor.  Subjective: Per mother patient is not at baseline.  Remains confused.  Speech is not clear.  Not interactive in the morning.  Later in the afternoon after receiving Ativan more interactive.  Physical Exam: Vitals:    08/20/21 0805 08/20/21 1218 08/20/21 1537 08/20/21 1943  BP: (!) 164/88 (!) 144/127 (!) 145/131 108/80  Pulse: 90 (!) 128 100 (!) 147  Resp: '17 19 17 20  '$ Temp: 98.6 F (37 C) 98.4 F (36.9 C) (!) 101.1 F (38.4 C) 98.1 F (36.7 C)  TempSrc: Oral Oral Oral Oral  SpO2: 97% 100%  100%  Weight:       General: Appear in mild distress, no Rash; Oral Mucosa Clear, dry. no Abnormal Neck Mass Or lumps, Conjunctiva normal  Cardiovascular: S1 and S2 Present, no Murmur, Respiratory: good respiratory effort, Bilateral Air entry present and Occasional wheezes Abdomen: Bowel Sound present, Soft and no tenderness Extremities: no Pedal edema Neurology: alert and not oriented to time, place, and person, incoherent speech, moving all extremities. affect appropriate. no new focal deficit Gait not checked due to patient safety concerns    Data Reviewed: I have Reviewed nursing notes, Vitals, and Lab results since pt's last encounter. Pertinent lab results CBC and BMP I have ordered test including CBC and BMP I have discussed pt's care plan and test results with IR.   Family Communication: Mother at bedside  Disposition: Status is: Inpatient Remains inpatient appropriate because: Remains tachycardic and encephalopathy.  Unsure presence of partial seizure.  Currently would like to at least ensure the patient has received home regimen before considering further work-up or neuro consultation. Also had fever.  Author: Berle Mull, MD 08/20/2021 8:20 PM  Please look on [email or web address] to find out who is on call.

## 2021-08-20 NOTE — Progress Notes (Signed)

## 2021-08-20 NOTE — Progress Notes (Signed)
    08/20/21 1943  Assess: MEWS Score  Temp 98.1 F (36.7 C)  BP 108/80  MAP (mmHg) 87  Pulse Rate (!) 147  Resp 20  SpO2 100 %  O2 Device Room Air  Assess: MEWS Score  MEWS Temp 0  MEWS Systolic 0  MEWS Pulse 3  MEWS RR 0  MEWS LOC 0  MEWS Score 3  MEWS Score Color Yellow  Assess: if the MEWS score is Yellow or Red  Were vital signs taken at a resting state? Yes  Focused Assessment No change from prior assessment  Does the patient meet 2 or more of the SIRS criteria? No  MEWS guidelines implemented *See Row Information* Yes  Treat  Pain Scale Faces  Pain Score 0  Take Vital Signs  Increase Vital Sign Frequency  Yellow: Q 2hr X 2 then Q 4hr X 2, if remains yellow, continue Q 4hrs  Escalate  MEWS: Escalate Yellow: discuss with charge nurse/RN and consider discussing with provider and RRT  Notify: Charge Nurse/RN  Name of Charge Nurse/RN Notified [place], RN  Date Charge Nurse/RN Notified 08/20/21  Time Charge Nurse/RN Notified 1959  Notify: Provider  Provider Name/Title Mitzi Hansen, MD  Date Provider Notified 08/20/21  Time Provider Notified 2006  Method of Notification Page  Notification Reason Other (Comment) (HR sustaining in 140s-150s, Yellow MEWS)  Provider response Evaluate remotely  Date of Provider Response 08/20/21  Time of Provider Response 2007  Document  Progress note created (see row info) Yes  Assess: SIRS CRITERIA  SIRS Temperature  0  SIRS Pulse 1  SIRS Respirations  0  SIRS WBC 0  SIRS Score Sum  1

## 2021-08-20 NOTE — Progress Notes (Signed)
Went in room to collect patients urinalysis. Not enough urine to collect yet. Will pass along to night nurse.

## 2021-08-20 NOTE — Progress Notes (Signed)
Speech Language Pathology Treatment: Dysphagia  Patient Details Name: Cassandra Andrade MRN: [id number] DOB: 1965-03-22 Today's Date: 08/20/2021 Time: 0722-5750 SLP Time Calculation (min) (ACUTE ONLY): 14 min  Assessment / Plan / Recommendation Clinical Impression  Pt was seen for dysphagia treatment with her mother present. Pt was lethargic throughout the evaluation. She roused briefly with verbal and tactile stimulation, but exhibited difficulty maintaining an adequate level of alertness. Bolus manipulation was absent despite prompts and cues and boluses were ultimately removed from oral cavity by SLP. Pt's current diet will be continued, but it is recommended that p.o. intake of meals and meds be deferred if pt's lethargy persists. SLP will continue to follow pt.     HPI HPI: Patient is a 56 y.o. 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 Patient recently admitted 5/23-5/29/23 with acute metabolic encephalopathy. Patient presented to the hospital on 08/11/21 and mother reported during current admission that patient has not been eating much since being discharged, has not had a BM in past couple of days, intermittently refusing pills/holding pills in mouth. In addition, mother noticing blood when changing patient's pull up diaper. In ED, patient with temperature of 99.7 degrees F, RR and oxygen saturations WFL. CT head did not reveal any acute abnormality with extensive encephalomalacia related to prior craniotomy. G-tube placed 6/28      SLP Plan  Continue with current plan of care      Recommendations for follow up therapy are one component of a multi-disciplinary discharge planning process, led by the attending physician.  Recommendations may be updated based on  patient status, additional functional criteria and insurance authorization.    Recommendations  Diet recommendations: Dysphagia 1 (puree);Thin liquid Liquids provided via: Straw;Cup Medication Administration: Whole meds with puree (whole/crushed in puree as tolerated) Supervision: Full supervision/cueing for compensatory strategies;Staff to assist with self feeding Compensations: Minimize environmental distractions Postural Changes and/or Swallow Maneuvers: Seated upright 90 degrees                Oral Care Recommendations: Oral care BID Follow Up Recommendations: Skilled nursing-short term rehab (<3 hours/day) Assistance recommended at discharge: Frequent or constant Supervision/Assistance SLP Visit Diagnosis: Dysphagia, unspecified (R13.10) Plan: Continue with current plan of care         Cassandra Andrade I. Hardin Negus, [place], [place] Office number [phone] Pager [place]  08/20/2021, 10:19 AM

## 2021-08-20 NOTE — Progress Notes (Signed)
Referring Physician(s): Dr. Verlon Au  Supervising Physician: Corrie Mckusick  Patient Status:  Cassandra Andrade [facility] - In-pt  Chief Complaint:  F/U Gtube placement.  Brief History:  Cassandra Andrade is a 56 y.o. female with a medical history significant for brain aneurysm rupture with intracranial hemorrhage s/p ventricular shunt/craniotomy, seizure disorder, DVT and lupus anticoagulant syndrome on chronic anticoagulation, right leg osteosarcoma s/p right BKA, quadriplegia/bed bound and chronic dysphagia on a pureed diet.    She had a poor appetite with weight loss ever since her prior hospitalization in May.   She was found to have an INR >10 and she was admitted for further treatment.   She was evaluated by speech therapy with recommendations for a dysphagia one diet.   Due to continued poor PO intake a gastrostomy tube has been recommended.    She underwent placement of a gastrostomy tube yesterday by Dr. Vernard Gambles.  Subjective:  Lying in bed. Appears comfortable. Non-verbal.  Allergies: Vicodin [hydrocodone-acetaminophen], Morphine, and Morphine and related  Medications: Prior to Admission medications   Medication Sig Start Date End Date Taking? Authorizing Provider  acetaminophen (TYLENOL) 650 MG CR tablet Take 650 mg by mouth every 8 (eight) hours as needed for pain.   Yes [provider]  Ascorbic Acid (VITAMIN C) 1000 MG tablet Take 1,000 mg by mouth daily. 08/17/11  Yes [provider]  BRIVIACT 100 MG TABS tablet Take 100 mg by mouth 2 (two) times daily. 07/09/21  Yes [provider]  clonazePAM (KLONOPIN) 1 MG tablet One tablet twice daily as needed for seizure. Patient taking differently: Take 1 mg by mouth 2 (two) times daily as needed (seizures). One tablet twice daily as needed for seizure. 04/24/21  Yes Camara, Maryan Puls, MD  ketoconazole (NIZORAL) 2 % shampoo Apply 1 application topically 2 (two) times a week. Monday,wednesday 02/05/21  Yes [provider]  lacosamide (VIMPAT) 200 MG TABS tablet Take 1 tablet (200 mg total) by mouth 2 (two) times daily. 04/20/21  Yes Alric Ran, MD  lamoTRIgine (LAMICTAL) 100 MG tablet Take 3 tablets (300 mg total) by mouth 2 (two) times daily along with 50 mg 2 (two) times daily Total of 350 mg twice daily (300 mg+ 50 mg) Patient taking differently: Take 300 mg by mouth See admin instructions. Take 3 tablets (300 mg total) by mouth 2 (two) times daily along with 50 mg 2 (two) times daily Total of 350 mg twice daily (300 mg+ 50 mg) 04/20/21  Yes Alric Ran, MD  lamoTRIgine (LAMICTAL) 25 MG tablet Take 2 tablets (50 mg total) by mouth 2 (two) times daily along with 300 mg 2 (two) times daily. Total of 350 mg twice daily (300 mg+ 50 mg) Patient taking differently: Take 50 mg by mouth See admin instructions. Take 2 tablets (50 mg total) by mouth 2 (two) times daily along with 300 mg 2 (two) times daily. Total of 350 mg twice daily (300 mg+ 50 mg) 04/20/21  Yes Camara, Amadou, MD  levothyroxine (SYNTHROID, LEVOTHROID) 50 MCG tablet Take 50 mcg by mouth every evening.   Yes [provider]  OLANZapine zydis (ZYPREXA) 5 MG disintegrating tablet Take 5 mg by mouth 2 (two) times daily. 07/09/21  Yes [provider]  omeprazole (PRILOSEC) 20 MG capsule Take 20 mg by mouth at bedtime. 06/12/19  Yes [provider]  promethazine (PHENERGAN) 25 MG tablet Take 25 mg by mouth every 8 (eight) hours as needed for nausea or vomiting. 04/27/19  Yes [provider]  venlafaxine XR (EFFEXOR-XR) 150 MG 24 hr capsule Take 1 capsule (150 mg total) by mouth daily with breakfast. 07/07/21  Yes Arfeen, Arlyce Harman, MD  warfarin (COUMADIN) 5 MG tablet Take 1.5-2 tablets (7.5-10 mg total) by mouth See admin instructions. Pt takes one and a half tablets (7.'5mg'$  total) on Sunday and Wednesday and 2 tablets ('10mg'$  total) all other days. Patient taking differently: Take 7.5-10 mg by mouth See admin  instructions. 7.'5mg'$  on Sundays and '10mg'$  on all other days or as directed. 01/03/18  Yes Thurnell Lose, MD  levofloxacin (LEVAQUIN) 250 MG tablet Take 1 tablet (250 mg total) by mouth daily. Patient not taking: Reported on 08/11/2021 07/21/21   Patrecia Pour, MD     Vital Signs: BP (!) 144/127   Pulse (!) 128   Temp 98.4 F (36.9 C) (Oral)   Resp 19   Wt 140 lb 6.9 oz (63.7 kg)   SpO2 100%   BMI 22.67 kg/m   Physical Exam Cardiovascular:     Rate and Rhythm: Normal rate.  Pulmonary:     Effort: Pulmonary effort is normal.  Abdominal:     Comments: Gtube site with a little dried blood. Cleansed away gently, no active bleeding. Site looks good.  Neurological:     Mental Status: She is alert.     Imaging: IR GASTROSTOMY TUBE MOD SED  Result Date: 08/19/2021 CLINICAL DATA:  Post CVA, needs enteral feeding support EXAM: PERC PLACEMENT GASTROSTOMY FLUOROSCOPY: Radiation Exposure Index (as provided by the fluoroscopic device): 5 mGy air Kerma TECHNIQUE: The procedure, risks, benefits, and alternatives were explained to the patient and family. Questions regarding the procedure were encouraged and answered; consent for the procedure was obtained. As antibiotic prophylaxis, cefazolin 2 g was ordered pre-procedure and administered intravenously within one hour of incision. Appropriate percutaneous access path was confirmed on recent CT abdomen. A 5 French angiographic catheter was placed as orogastric tube. The upper abdomen was prepped with Betadine, draped in usual sterile fashion, and infiltrated locally with 1% lidocaine. Intravenous Fentanyl 46mg and Versed '2mg'$  were administered as conscious sedation during continuous monitoring of the patient's level of consciousness and physiological / cardiorespiratory status by the radiology RN, with a total moderate sedation time of 10 minutes. 0.5 mg glucagon given IV.Stomach was insufflated using air through the orogastric tube. An 137French sheath  needle was advanced percutaneously into the gastric lumen under fluoroscopy. Gas could be aspirated and a small contrast injection confirmed intraluminal spread. The sheath was exchanged over a guidewire for a 9 [place] sheath, through which the snare device was advanced and used to snare a guidewire passed through the orogastric tube. This was withdrawn, and the snare attached to the 20 French pull-through gastrostomy tube, which was advanced antegrade, positioned with the internal bumper securing the anterior gastric wall to the anterior abdominal wall. Small contrast injection confirms appropriate positioning. The external bumper was applied and the catheter was flushed. COMPLICATIONS: COMPLICATIONS none IMPRESSION: 1. Technically successful 20 French pull-through gastrostomy placement under fluoroscopy. Electronically Signed   By: DLucrezia EuropeM.D.   On: 08/19/2021 10:33    Labs:  CBC: Recent Labs    08/18/21 0213 08/19/21 0220 08/19/21 1625 08/20/21 0206  WBC 6.7 7.8 10.5 10.4  HGB 8.2* 9.3* 9.0* 8.3*  HCT 25.9* 29.7* 27.9* 24.9*  PLT 346 389 400 246    COAGS: Recent Labs    02/13/21 2212 02/14/21 0240 06/09/21 2216 06/10/21 0514 06/11/21  1007 08/11/21 0955 08/11/21 2032 08/13/21 0109 08/17/21 0558 08/18/21 0213 08/19/21 0220  INR 1.8*   < > 7.6* 4.3*   < > >10.0*   < > 1.5* 2.3* 1.7* 1.3*  APTT 38*  --  86* 77*  --  179*  --   --   --   --   --    < > = values in this interval not displayed.    BMP: Recent Labs    08/18/21 0213 08/19/21 0220 08/19/21 1625 08/20/21 0206  NA 142 139 138 136  K 4.0 3.9 4.2 3.5  CL 106 101 100 104  CO2 '27 25 22 '$ 21*  GLUCOSE 116* 91 111* 162*  BUN <5* <5* 5* <5*  CALCIUM 9.2 9.7 9.4 8.8*  CREATININE 0.68 0.66 0.67 0.58  GFRNONAA >60 >60 >60 >60    LIVER FUNCTION TESTS: Recent Labs    07/16/21 0615 07/19/21 0148 07/20/21 0025 08/11/21 0955  BILITOT 0.4 0.2* 0.3 0.9  AST 24 170* 204* 13*  ALT 18 86* 130* 12   ALKPHOS 87 93 83 95  PROT 6.4* 6.4* 6.3* 7.4  ALBUMIN 2.4* 2.5* 2.5* 3.1*    Assessment and Plan:  Malnutrtion = s/p Gastrostomy tube placement by Dr. Vernard Gambles yesterday.  Ok to use tube for meds and feeds. Routine Gtube care.  Electronically Signed: Murrell Redden, PA-C 08/20/2021, 1:01 PM    I spent a total of 15 Minutes at the the patient's bedside AND on the patient's hospital floor or unit, greater than 50% of which was counseling/coordinating care for f/u Gtube.

## 2021-08-20 NOTE — Progress Notes (Signed)
Initial Nutrition Assessment  DOCUMENTATION CODES:  Not applicable  INTERVENTION:  Recommend initiation of TF via PEG: Osmolite 1.5 at 25 mL/h and advance by 64m q8h to a goal of 551mh Prosource TF BID (40kcal and 11g of protein per packet) Water flushes of 10032m4h This will provide 2060 kcal, 105g of protein, and 1606m20m free water (TF+flush) Pt is at risk for refeeding monitor electrolytes for abnormalities and replace prn as TF advances to goal Thiamine '100mg'$  x 5 days for refeeding risk Continue current diet as ordered per SLP When pt is tolerating TF and is to be discharged home, could transition to bolus feeds. Would recommend the following: Osmolite 1.5, 6 cartons/d 60mL18mfree water before and after each bolus (120mL 58m) This would provide 2130kcal, 89g of protein, and 1806mL o4mee water  NUTRITION DIAGNOSIS:  Increased nutrient needs related to wound healing as evidenced by estimated needs.  GOAL:  Patient will meet greater than or equal to 90% of their needs  MONITOR:  PO intake, TF tolerance, Skin  REASON FOR ASSESSMENT:  New TF    ASSESSMENT:  Pt with hx osteosarcoma s/p right BKA, intracranial hemorrhage s/p ventricular shunt, and seizure disorder presented to ED after mom noted blood in her pull up. Pt on pured diet at baseline but mom reports poor intake since last hospitalization. Functional quadriplegia and bedbound.  INR found to be >10 on admission and once corrected, bleeding resolved. Due to poor intake, mom requested placement of G-tube which was completed in radiology 6/28.  Pt working with therapy at first attempted visit and receiving personal care at the second attempt. Pt is well known to RD from previous admissions. PEG in place and per Radiology is ok to use. Pt has had minimal intake this admission and a hx of ongoing poor PO. At risk for refeeding. Will advance feeds slowly and monitor for electrolyte abnormalities.   Average Meal  Intake: 6/23-6/29: 23% intake x 8 recorded meals  Nutritionally Relevant Medications: Scheduled Meds:  OLANZapine zydis  5 mg Oral BID   pantoprazole  40 mg Oral Daily   Continuous Infusions:  brivaracetam (BRIVIACT) 100 mg in sodium chloride 0.9 % 50 mL 100 mg (08/19/21 2141)   dextrose 5% lactated ringers 125 mL/hr at 08/20/21 0536   lacosamide (VIMPAT) IV 200 mg (08/19/21 2103)   PRN Meds: food thickener, ondansetron  Labs Reviewed  NUTRITION - FOCUSED PHYSICAL EXAM: Defer to in-person assessment  Diet Order:   Diet Order             DIET - DYS 1 Room service appropriate? 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  Estimated Nutritional Needs:  Kcal:  1900-2100 kcal/d Protein:  100-120g/d Fluid:  >/=1.9L/d   Shiori Adcox Ranell PatrickDN Clinical Dietitian RD pager # available in [facility]  [facility] hours/weekend  pager # available in [facility]

## 2021-08-20 NOTE — Plan of Care (Signed)
  Problem: Education: Goal: Knowledge of General Education information will improve Description: Including pain rating scale, medication(s)/side effects and non-pharmacologic comfort measures Outcome: Not Progressing   Problem: Health Behavior/Discharge Planning: Goal: Ability to manage health-related needs will improve Outcome: Not Progressing   Problem: Clinical Measurements: Goal: Ability to maintain clinical measurements within normal limits will improve Outcome: Not Progressing Goal: Will remain free from infection Outcome: Progressing Goal: Respiratory complications will improve Outcome: Progressing

## 2021-08-20 NOTE — Progress Notes (Signed)
Ok to give one dose of IV lacosamide and brivaracetam tonight per Dr Posey Pronto and cont with PO regimen.  Onnie Boer, PharmD, BCIDP, AAHIVP, CPP Infectious Disease Pharmacist 08/20/2021 5:36 PM

## 2021-08-21 DIAGNOSIS — R58 Hemorrhage, not elsewhere classified: Secondary | ICD-10-CM | POA: Diagnosis not present

## 2021-08-21 LAB — COMPREHENSIVE METABOLIC PANEL
ALT: 9 U/L (ref 0–44)
AST: 15 U/L (ref 15–41)
Albumin: 2.1 g/dL — ABNORMAL LOW (ref 3.5–5.0)
Alkaline Phosphatase: 70 U/L (ref 38–126)
Anion gap: 8 (ref 5–15)
BUN: 6 mg/dL (ref 6–20)
CO2: 27 mmol/L (ref 22–32)
Calcium: 8.9 mg/dL (ref 8.9–10.3)
Chloride: 104 mmol/L (ref 98–111)
Creatinine, Ser: 0.59 mg/dL (ref 0.44–1.00)
GFR, Estimated: >60 mL/min (ref >60)
Glucose, Bld: 197 mg/dL — ABNORMAL HIGH (ref 70–99)
Potassium: 2.6 mmol/L — CL (ref 3.5–5.1)
Sodium: 139 mmol/L (ref 135–145)
Total Bilirubin: 0.6 mg/dL (ref 0.3–1.2)
Total Protein: 5.5 g/dL — ABNORMAL LOW (ref 6.5–8.1)

## 2021-08-21 LAB — URINALYSIS, ROUTINE W REFLEX MICROSCOPIC
Bilirubin Urine: NEGATIVE
Glucose, UA: NEGATIVE mg/dL
Ketones, ur: NEGATIVE mg/dL
Leukocytes,Ua: NEGATIVE
Nitrite: NEGATIVE
Protein, ur: NEGATIVE mg/dL
Specific Gravity, Urine: 1.005 (ref 1.005–1.030)
pH: 6 (ref 5.0–8.0)

## 2021-08-21 LAB — CBC
HCT: 23.2 % — ABNORMAL LOW (ref 36.0–46.0)
Hemoglobin: 7.5 g/dL — ABNORMAL LOW (ref 12.0–15.0)
MCH: 27.8 pg (ref 26.0–34.0)
MCHC: 32.3 g/dL (ref 30.0–36.0)
MCV: 85.9 fL (ref 80.0–100.0)
Platelets: 406 10*3/uL — ABNORMAL HIGH (ref 150–400)
RBC: 2.7 MIL/uL — ABNORMAL LOW (ref 3.87–5.11)
RDW: 15.4 % (ref 11.5–15.5)
WBC: 10.7 10*3/uL — ABNORMAL HIGH (ref 4.0–10.5)
nRBC: 0 % (ref 0.0–0.2)

## 2021-08-21 LAB — LACTIC ACID, PLASMA: Lactic Acid, Venous: 2.6 mmol/L (ref 0.5–1.9)

## 2021-08-21 LAB — MAGNESIUM
Magnesium: 2.3 mg/dL (ref 1.7–2.4)
Magnesium: 1.7 mg/dL (ref 1.7–2.4)

## 2021-08-21 LAB — RETICULOCYTES
Immature Retic Fract: 26.6 % — ABNORMAL HIGH (ref 2.3–15.9)
RBC.: 2.77 MIL/uL — ABNORMAL LOW (ref 3.87–5.11)
Retic Count, Absolute: 47.4 10*3/uL (ref 19.0–186.0)
Retic Ct Pct: 1.7 % (ref 0.4–3.1)

## 2021-08-21 LAB — VITAMIN B12: Vitamin B-12: 1252 pg/mL — ABNORMAL HIGH (ref 180–914)

## 2021-08-21 LAB — BASIC METABOLIC PANEL
Anion gap: 7 (ref 5–15)
BUN: 6 mg/dL (ref 6–20)
CO2: 27 mmol/L (ref 22–32)
Calcium: 8.9 mg/dL (ref 8.9–10.3)
Chloride: 105 mmol/L (ref 98–111)
Creatinine, Ser: 0.52 mg/dL (ref 0.44–1.00)
GFR, Estimated: >60 mL/min (ref >60)
Glucose, Bld: 128 mg/dL — ABNORMAL HIGH (ref 70–99)
Potassium: 4.6 mmol/L (ref 3.5–5.1)
Sodium: 139 mmol/L (ref 135–145)

## 2021-08-21 LAB — CBC WITH DIFFERENTIAL/PLATELET
Abs Immature Granulocytes: 0.04 10*3/uL (ref 0.00–0.07)
Basophils Absolute: 0 10*3/uL (ref 0.0–0.1)
Basophils Relative: 0 %
Eosinophils Absolute: 0 10*3/uL (ref 0.0–0.5)
Eosinophils Relative: 1 %
HCT: 23.2 % — ABNORMAL LOW (ref 36.0–46.0)
Hemoglobin: 7.4 g/dL — ABNORMAL LOW (ref 12.0–15.0)
Immature Granulocytes: 1 %
Lymphocytes Relative: 21 %
Lymphs Abs: 1.7 10*3/uL (ref 0.7–4.0)
MCH: 27.5 pg (ref 26.0–34.0)
MCHC: 31.9 g/dL (ref 30.0–36.0)
MCV: 86.2 fL (ref 80.0–100.0)
Monocytes Absolute: 0.7 10*3/uL (ref 0.1–1.0)
Monocytes Relative: 9 %
Neutro Abs: 5.6 10*3/uL (ref 1.7–7.7)
Neutrophils Relative %: 68 %
Platelets: 349 10*3/uL (ref 150–400)
RBC: 2.69 MIL/uL — ABNORMAL LOW (ref 3.87–5.11)
RDW: 15.5 % (ref 11.5–15.5)
WBC: 8.1 10*3/uL (ref 4.0–10.5)
nRBC: 0 % (ref 0.0–0.2)

## 2021-08-21 LAB — TSH: TSH: 7.586 u[IU]/mL — ABNORMAL HIGH (ref 0.350–4.500)

## 2021-08-21 LAB — T4, FREE: Free T4: 0.89 ng/dL (ref 0.61–1.12)

## 2021-08-21 LAB — PHOSPHORUS
Phosphorus: 3.8 mg/dL (ref 2.5–4.6)
Phosphorus: 1.7 mg/dL — ABNORMAL LOW (ref 2.5–4.6)

## 2021-08-21 LAB — GLUCOSE, CAPILLARY
Glucose-Capillary: 177 mg/dL — ABNORMAL HIGH (ref 70–99)
Glucose-Capillary: 143 mg/dL — ABNORMAL HIGH (ref 70–99)
Glucose-Capillary: 173 mg/dL — ABNORMAL HIGH (ref 70–99)
Glucose-Capillary: 140 mg/dL — ABNORMAL HIGH (ref 70–99)

## 2021-08-21 MED ORDER — METOPROLOL TARTRATE 25 MG/10 ML ORAL SUSPENSION
25.0000 mg | Freq: Two times a day (BID) | ORAL | Status: DC
Start: 2021-08-21 — End: 2021-08-25
  Administered 2021-08-21 – 2021-08-25 (×9): 25 mg
  Filled 2021-08-21 (×11): qty 10

## 2021-08-21 MED ORDER — POTASSIUM CHLORIDE 20 MEQ PO PACK
40.0000 meq | PACK | Freq: Once | ORAL | Status: DC
Start: 2021-08-21 — End: 2021-08-21

## 2021-08-21 MED ORDER — INSULIN ASPART 100 UNIT/ML IJ SOLN
0.0000 [IU] | Freq: Four times a day (QID) | INTRAMUSCULAR | Status: DC
  Administered 2021-08-21: 1 [IU] via SUBCUTANEOUS
  Administered 2021-08-22 (×2): 2 [IU] via SUBCUTANEOUS
  Administered 2021-08-22: 1 [IU] via SUBCUTANEOUS
  Administered 2021-08-22 – 2021-08-23 (×2): 2 [IU] via SUBCUTANEOUS
  Administered 2021-08-23: 1 [IU] via SUBCUTANEOUS
  Administered 2021-08-23 – 2021-08-25 (×7): 2 [IU] via SUBCUTANEOUS
  Administered 2021-08-25: 1 [IU] via SUBCUTANEOUS

## 2021-08-21 MED ORDER — POTASSIUM CHLORIDE 10 MEQ/100ML IV SOLN
10.0000 meq | INTRAVENOUS | Status: AC
  Administered 2021-08-21 (×3): 10 meq via INTRAVENOUS
  Filled 2021-08-21 (×3): qty 100

## 2021-08-21 MED ORDER — PANTOPRAZOLE 2 MG/ML SUSPENSION
40.0000 mg | Freq: Every day | ORAL | Status: DC
  Administered 2021-08-21 – 2021-08-25 (×5): 40 mg
  Filled 2021-08-21 (×6): qty 20

## 2021-08-21 MED ORDER — LEVOTHYROXINE SODIUM 75 MCG PO TABS
75.0000 ug | ORAL_TABLET | Freq: Every day | ORAL | Status: DC
  Administered 2021-08-22 – 2021-08-25 (×4): 75 ug
  Filled 2021-08-21 (×4): qty 1

## 2021-08-21 MED ORDER — DEXTROSE 5 % IV SOLN
30.0000 mmol | Freq: Once | INTRAVENOUS | Status: AC
  Administered 2021-08-21: 30 mmol via INTRAVENOUS
  Filled 2021-08-21: qty 10

## 2021-08-21 MED ORDER — LORAZEPAM 2 MG/ML IJ SOLN: 1.0000 mg | INTRAMUSCULAR | Status: DC | PRN

## 2021-08-21 MED ORDER — LACOSAMIDE 50 MG PO TABS
200.0000 mg | ORAL_TABLET | Freq: Two times a day (BID) | ORAL | Status: DC
  Administered 2021-08-21 – 2021-08-25 (×9): 200 mg
  Filled 2021-08-21 (×9): qty 4

## 2021-08-21 MED ORDER — POTASSIUM CHLORIDE 20 MEQ PO PACK
40.0000 meq | PACK | Freq: Once | ORAL | Status: AC
  Administered 2021-08-21: 40 meq
  Filled 2021-08-21: qty 2

## 2021-08-21 MED ORDER — POTASSIUM CHLORIDE 20 MEQ PO PACK
40.0000 meq | PACK | Freq: Once | ORAL | Status: AC
Start: 2021-08-21 — End: 2021-08-21
  Administered 2021-08-21: 40 meq
  Filled 2021-08-21: qty 2

## 2021-08-21 MED ORDER — ONDANSETRON HCL 4 MG/2ML IJ SOLN: 4.0000 mg | Freq: Four times a day (QID) | INTRAMUSCULAR | Status: DC | PRN

## 2021-08-21 MED ORDER — METOPROLOL TARTRATE 25 MG/10 ML ORAL SUSPENSION
25.0000 mg | Freq: Two times a day (BID) | ORAL | Status: DC
  Filled 2021-08-21 (×2): qty 10

## 2021-08-21 NOTE — TOC Progression Note (Addendum)
Transition of Care [facility]) - Progression Note    Patient Details  Name: Ziare Orrick MRN: [id number] Date of Birth: [birth year]  Transition of Care [facility]) CM/SW Contact  Jacalyn Lefevre Edson Snowball, RN Phone Number: 08/21/2021, 10:38 AM  Clinical Narrative:     Order placed for bolus tube feeds for home.  Anticipated discharge date Monday.    Pam with [employer] aware and will connect with patient's mother for teaching.    Levada Dy with [place] aware   57 Pam with [employer] spoke to patient's mother to arrange tube feeding teaching today. Patient's mother does not drive and does not have transportation today. Pam and patient's mother will meet Monday morning at 0830 am in patient's room for education.   Expected Discharge Plan: [place]    Expected Discharge Plan and Services Expected Discharge Plan: [place]   Discharge Planning Services: CM Consult Post Acute Care Choice: Home Health Living arrangements for the past 2 months: Single Family Home                 DME Arranged: Tube feeding         HH Arranged: RN           Social Determinants of Health (SDOH) Interventions    Readmission Risk Interventions     No data to display

## 2021-08-21 NOTE — Progress Notes (Signed)
   08/21/21 1619  Assess: MEWS Score  Temp (!) 101.6 F (38.7 C)  BP 130/74  MAP (mmHg) 88  Pulse Rate (!) 122  Resp 20  SpO2 100 %  Assess: MEWS Score  MEWS Temp 2  MEWS Systolic 0  MEWS Pulse 2  MEWS RR 0  MEWS LOC 0  MEWS Score 4  MEWS Score Color Red  Assess: if the MEWS score is Yellow or Red  Were vital signs taken at a resting state? Yes  Focused Assessment No change from prior assessment  MEWS guidelines implemented *See Row Information* Yes  Treat  MEWS Interventions Administered prn meds/treatments (Dr. Posey Pronto and I had been talking about her VS all day. He had just ordered PRN meds for her. HR had been elevated most of day.)  Pain Scale Faces  Faces Pain Scale 0  Take Vital Signs  Increase Vital Sign Frequency  Red: Q 1hr X 4 then Q 4hr X 4, if remains red, continue Q 4hrs  Escalate  MEWS: Escalate Red: discuss with charge nurse/RN and provider, consider discussing with RRT  Notify: Charge Nurse/RN  Name of Charge Nurse/RN Notified Catrina RN  Date Charge Nurse/RN Notified 08/21/21  Time Charge Nurse/RN Notified 1620  Notify: Provider  Provider Name/Title Dr. Posey Pronto  Date Provider Notified 08/21/21  Assess: SIRS CRITERIA  SIRS Temperature  1  SIRS Pulse 1  SIRS Respirations  0  SIRS WBC 0  SIRS Score Sum  2

## 2021-08-21 NOTE — Progress Notes (Signed)
   08/21/21 1902  Assess: MEWS Score  Temp 99.5 F (37.5 C)  BP 109/63  MAP (mmHg) 76  Pulse Rate 94  Resp 18  SpO2 100 %  O2 Device Room Air  Assess: MEWS Score  MEWS Temp 0  MEWS Systolic 0  MEWS Pulse 0  MEWS RR 0  MEWS LOC 0  MEWS Score 0  MEWS Score Color Green  Assess: if the MEWS score is Yellow or Red  Were vital signs taken at a resting state? Yes  Focused Assessment No change from prior assessment  MEWS guidelines implemented *See Row Information* No, previously red, continue vital signs every 4 hours  Document  Patient Outcome Stabilized after interventions  Assess: SIRS CRITERIA  SIRS Temperature  0  SIRS Pulse 1  SIRS Respirations  0  SIRS WBC 0  SIRS Score Sum  1

## 2021-08-21 NOTE — Plan of Care (Signed)

## 2021-08-21 NOTE — Progress Notes (Signed)
  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  Most of her dysphagia issue requiring PEG tube placement. Assessment and Plan: Supratherapeutic INR. Hematuria. Hemoglobin dropped to 6.6. SP 1 PRBC transfusion. Currently hemoglobin stable. INR normalized with vitamin K. Monitor.   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 Functional quadriplegia. PT OT following.  Chronic dysphagia SP PEG tube placement.  Currently on tube feeding.  Monitor response.   History of DVT in the past. Lupus anticoagulant positive. On chronic Coumadin. We will resume Lovenox. Once the patient's feeding tube can be used we will transition to oral Coumadin as well.   Hypokalemia. Corrected.   Sinus tachycardia. Most likely pain related.  Hemoglobin stable. Monitor for now.  Will initiate scheduled Lopressor as there is no acute reversible etiology.   Fever on 6/29. Chest x-ray unremarkable.  UA unremarkable.  Blood cultures pending.  No further fever.  Monitor.   Mood disorder. Severely agitated. We will continue current regimen and monitor.   Hypothyroidism Continue Synthroid.   Seizure disorder No evidence of active seizures. Monitor. LTM negative.  Patient has missed her morning medication.  We will provide 1 dose of IV medication.  And resume home medication.  Patient was staring with tremors bilaterally which improved significantly with the use of Ativan.  Acute on chronic nutritional anemia. Hemoglobin is low atbaseline.  Currently hemoglobin is 7 7. We will continue Lovenox but would not  initiate Coumadin.   Agitation. Possibly pain related. We will resume home medication and monitor.  Subjective: No nausea no vomiting.  Mentation improving.  Able to follow commands and interact.  Remains tachycardic.  Physical Exam: Vitals:   08/21/21 0917 08/21/21 1134 08/21/21 1619 08/21/21 1737  BP: 104/85  130/74 98/75  Pulse: (!) 126  (!) 122 95  Resp: (!) '22  20 20  '$ Temp: 97.9 F (36.6 C)  (!) 101.6 F (38.7 C) 100 F (37.8 C)  TempSrc: Oral  Oral Oral  SpO2: 98%  100% 100%  Weight:  71.6 kg     General: Appear in mild distress; no visible Abnormal Neck Mass Or lumps, Conjunctiva normal Cardiovascular: S1 and S2 Present, no Murmur, Respiratory: good respiratory effort, Bilateral Air entry present and CTA, no Crackles, no wheezes Abdomen: Bowel Sound present, Non tender Extremities: no Pedal edema Neurology: alert and incoherent speech.  Difficult to know alertness.  Able to follow commands. Gait not checked due to patient safety concerns   Data Reviewed: I have Reviewed nursing notes, Vitals, and Lab results since pt's last encounter. Pertinent lab results CBC and BMP I have ordered test including CBC and BMP    Family Communication: None at bedside  Disposition: Status is: Inpatient Remains inpatient appropriate because: Continue tube feeding, monitor for improvement in mentation and tachycardia.  Monitor for hemoglobin stabilization.  Author: Berle Mull, MD 08/21/2021 6:28 PM  Please look on [email or web address] to find out who is on call.

## 2021-08-21 NOTE — Progress Notes (Signed)
Date and time results received: 08/21/21 0412 Test: Potassium Critical Value: 2.6  Name of Provider Notified: Mitzi Hansen, MD  Orders Received? Or Actions Taken?: Orders Received - See Orders for details

## 2021-08-22 ENCOUNTER — Inpatient Hospital Stay (HOSPITAL_COMMUNITY): Payer: Medicare Other

## 2021-08-22 DIAGNOSIS — R58 Hemorrhage, not elsewhere classified: Secondary | ICD-10-CM | POA: Diagnosis not present

## 2021-08-22 LAB — COMPREHENSIVE METABOLIC PANEL
ALT: 9 U/L (ref 0–44)
AST: 20 U/L (ref 15–41)
Albumin: 1.9 g/dL — ABNORMAL LOW (ref 3.5–5.0)
Alkaline Phosphatase: 77 U/L (ref 38–126)
Anion gap: 12 (ref 5–15)
BUN: 8 mg/dL (ref 6–20)
CO2: 26 mmol/L (ref 22–32)
Calcium: 8.3 mg/dL — ABNORMAL LOW (ref 8.9–10.3)
Chloride: 98 mmol/L (ref 98–111)
Creatinine, Ser: 0.52 mg/dL (ref 0.44–1.00)
GFR, Estimated: >60 mL/min (ref >60)
Glucose, Bld: 159 mg/dL — ABNORMAL HIGH (ref 70–99)
Potassium: 3.8 mmol/L (ref 3.5–5.1)
Sodium: 136 mmol/L (ref 135–145)
Total Bilirubin: 0.2 mg/dL — ABNORMAL LOW (ref 0.3–1.2)
Total Protein: 5.2 g/dL — ABNORMAL LOW (ref 6.5–8.1)

## 2021-08-22 LAB — CBC
HCT: 21 % — ABNORMAL LOW (ref 36.0–46.0)
Hemoglobin: 6.8 g/dL — CL (ref 12.0–15.0)
MCH: 27.4 pg (ref 26.0–34.0)
MCHC: 32.4 g/dL (ref 30.0–36.0)
MCV: 84.7 fL (ref 80.0–100.0)
Platelets: 340 10*3/uL (ref 150–400)
RBC: 2.48 MIL/uL — ABNORMAL LOW (ref 3.87–5.11)
RDW: 15.5 % (ref 11.5–15.5)
WBC: 8.1 10*3/uL (ref 4.0–10.5)
nRBC: 0 % (ref 0.0–0.2)

## 2021-08-22 LAB — GLUCOSE, CAPILLARY
Glucose-Capillary: 127 mg/dL — ABNORMAL HIGH (ref 70–99)
Glucose-Capillary: 159 mg/dL — ABNORMAL HIGH (ref 70–99)
Glucose-Capillary: 169 mg/dL — ABNORMAL HIGH (ref 70–99)
Glucose-Capillary: 171 mg/dL — ABNORMAL HIGH (ref 70–99)
Glucose-Capillary: 175 mg/dL — ABNORMAL HIGH (ref 70–99)
Glucose-Capillary: 197 mg/dL — ABNORMAL HIGH (ref 70–99)

## 2021-08-22 LAB — PREPARE RBC (CROSSMATCH)

## 2021-08-22 LAB — HEMOGLOBIN AND HEMATOCRIT, BLOOD
HCT: 25.6 % — ABNORMAL LOW (ref 36.0–46.0)
Hemoglobin: 8.1 g/dL — ABNORMAL LOW (ref 12.0–15.0)

## 2021-08-22 LAB — HEMOGLOBIN A1C
Hgb A1c MFr Bld: 5.4 % (ref 4.8–5.6)
Mean Plasma Glucose: 108 mg/dL

## 2021-08-22 MED ORDER — ENOXAPARIN SODIUM 80 MG/0.8ML IJ SOSY
70.0000 mg | PREFILLED_SYRINGE | Freq: Two times a day (BID) | INTRAMUSCULAR | Status: DC
Start: 2021-08-22 — End: 2021-08-24
  Administered 2021-08-22 – 2021-08-24 (×4): 70 mg via SUBCUTANEOUS
  Filled 2021-08-22 (×4): qty 0.8

## 2021-08-22 MED ORDER — SODIUM CHLORIDE 0.9% IV SOLUTION: Freq: Once | INTRAVENOUS | Status: AC

## 2021-08-22 NOTE — Progress Notes (Signed)
[place] for warfarin>lovenox Indication:  DVT, lupus anticoagulant  Allergies  Allergen Reactions   Vicodin [Hydrocodone-Acetaminophen] Nausea And Vomiting   Morphine Rash   Morphine And Related Rash    Allergic reaction only in iv form    Vital Signs: Temp: 98.7 F (37.1 C) (07/01 1528) Temp Source: Oral (07/01 1528) BP: 123/84 (07/01 1528) Pulse Rate: 108 (07/01 1528)  Labs: Recent Labs    08/21/21 0305 08/21/21 1406 08/22/21 0312 08/22/21 0915  HGB 7.4* 7.5* 6.8* 8.1*  HCT 23.2* 23.2* 21.0* 25.6*  PLT 349 406* 340  --   CREATININE 0.59 0.52 0.52  --      Estimated Creatinine Clearance: 81.5 mL/min (by C-G formula based on SCr of 0.52 mg/dL).   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 INR >10 on presentation. Noted recently on Levaquin for infection but off for several weeks now PTA. Pt also on diflucan this admission. Warfarin held and reversed with vit K on 6/26 and 6/27 so G-tube could be placed by IR. This was done 6/28. I  Lovenox currently on hold for dropping Hgb, pharmacy asked to resume today.  Most recent weight appears to be ~ 71.6 kg.  Goal of Therapy:  INR 2-3; anti-Xa level (4 hours post dose) 0.6-1 units/ml Monitor platelets by anticoagulation protocol: Yes   Plan:  Lovenox '1mg'$ /kg (70 mg) SQ every 12 hours Monitor renal function, CBC, s/s bleeding F/u restart of warfarin  [place] Crane, Roylene Reason, [facility] Clinical Pharmacist  08/22/2021 5:33 PM   [facility] pharmacy phone numbers are listed on [email or web address]

## 2021-08-22 NOTE — Progress Notes (Signed)
CRITICAL VALUE STICKER  CRITICAL VALUE: Hemoglobin 6.8  RECEIVER (on-site recipient of call): Macie Burows, [place] NOTIFIED: 08/22/21 at 0359  MESSENGER (representative from lab): Hailey  MD NOTIFIED: Mitzi Hansen, MD  TIME OF NOTIFICATION: 0400  RESPONSE: Transfuse for Hgb <7, see orders

## 2021-08-22 NOTE — Progress Notes (Addendum)
Hgb down to 6.8 this am. No signs of active bleeding, she had normal BM this shift per RN. Plan to transfuse 1 unit RBC.

## 2021-08-22 NOTE — Progress Notes (Signed)
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  Most of her dysphagia issue requiring PEG tube placement. Assessment and Plan: Hematuria Supratherapeutic INR. Acute blood loss anemia. INR was more than 10 on admission.  Hemoglobin level was 9 on admission. At lowest dropped down to 6.6. Requiring 1 PRBC transfusion. On 7/1 required another PRBC transfusion most likely due to dilutional drop in hemoglobin. Currently repeat hemoglobin is expectedly elevated. CT abdomen pelvis negative for any retroperitoneal bleed. We will resume Lovenox and monitor. Transfuse for hemoglobin less than 7.  Acute metabolic encephalopathy. Appears to be multifocal in nature. EEG negative.  Currently mentation appears to be close to baseline after resuming her home regimen. Monitor for now.  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 Functional quadriplegia. PT OT following.  Chronic dysphagia PEG tube placement was delayed due to elevated INR. Now S/P PEG tube placement.  Currently on tube feeding Tolerating it well.   History of DVT in the past. Lupus anticoagulant positive. On chronic Coumadin. We will resume Lovenox. Once the patient's hemoglobin stabilizes on Lovenox, we will transition to oral Coumadin as well.   Hypokalemia. Corrected.   Sinus tachycardia. Most likely pain related.  Hemoglobin stable. Monitor for now.  Will initiate scheduled Lopressor as there is no acute reversible etiology.   Fever on 6/29. Chest x-ray unremarkable.  UA unremarkable.  Blood cultures so far no growth.  No  further fever.  Monitor.   Mood disorder. Agitation resolved. We will continue current regimen and monitor.   Hypothyroidism Continue Synthroid.   Seizure disorder No evidence of active seizures. Monitor. LTM negative.  Patient has missed her morning medication.  We will provide 1 dose of IV medication.  And resume home medication.  Patient was staring with tremors bilaterally which improved significantly with the use of Ativan and now with resumption of home regimen.  Subjective: mentation better, more interactive. No pain no bleeding,   Physical Exam: Vitals:   08/22/21 0657 08/22/21 0728 08/22/21 1130 08/22/21 1528  BP: 104/67 108/62 119/72 123/84  Pulse: 92 (!) 101 85 (!) 108  Resp: '18 18 18 18  '$ Temp: 98.9 F (37.2 C) 99 F (37.2 C) 98.2 F (36.8 C) 98.7 F (37.1 C)  TempSrc: Oral Oral Oral Oral  SpO2: 100% 100% 100% 98%  Weight:       General: Appear in mild distress; no visible Abnormal Neck Mass Or lumps, Conjunctiva normal Cardiovascular: S1 and S2 Present, no Murmur, Respiratory: good respiratory effort, Bilateral Air entry present and CTA, no Crackles, no wheezes Abdomen: Bowel Sound present, Non tender  Extremities: no Pedal edema Neurology: alert and oriented to self  Gait not checked due to patient safety concerns   Data Reviewed: I have Reviewed nursing notes, Vitals, and Lab results since pt's last encounter. Pertinent lab results CBC and BMP I have ordered test including CBC and BMP I have ordered imaging studies CT abdomen and pelvis which is negative for retroperitoneal bleeding.   Family Communication: Multiple family members at bedside  Disposition: Status is: Inpatient Remains inpatient appropriate because: Currently gradually advancing tube feeding and monitoring hemoglobin which is requiring transfusion in the setting  of need for therapeutic anticoagulation.  Author: Berle Mull, MD 08/22/2021 5:25 PM  Please look on [email or web address] to find  out who is on call.

## 2021-08-23 DIAGNOSIS — R58 Hemorrhage, not elsewhere classified: Secondary | ICD-10-CM | POA: Diagnosis not present

## 2021-08-23 LAB — GLUCOSE, CAPILLARY
Glucose-Capillary: 146 mg/dL — ABNORMAL HIGH (ref 70–99)
Glucose-Capillary: 171 mg/dL — ABNORMAL HIGH (ref 70–99)
Glucose-Capillary: 113 mg/dL — ABNORMAL HIGH (ref 70–99)

## 2021-08-23 LAB — BPAM RBC
Blood Product Expiration Date: 202307222359
ISSUE DATE / TIME: 202307010436
Unit Type and Rh: 5100

## 2021-08-23 LAB — COMPREHENSIVE METABOLIC PANEL
ALT: 15 U/L (ref 0–44)
AST: 29 U/L (ref 15–41)
Albumin: 2 g/dL — ABNORMAL LOW (ref 3.5–5.0)
Alkaline Phosphatase: 87 U/L (ref 38–126)
Anion gap: 8 (ref 5–15)
BUN: 10 mg/dL (ref 6–20)
CO2: 30 mmol/L (ref 22–32)
Calcium: 8.4 mg/dL — ABNORMAL LOW (ref 8.9–10.3)
Chloride: 100 mmol/L (ref 98–111)
Creatinine, Ser: 0.52 mg/dL (ref 0.44–1.00)
GFR, Estimated: >60 mL/min (ref >60)
Glucose, Bld: 140 mg/dL — ABNORMAL HIGH (ref 70–99)
Potassium: 4.6 mmol/L (ref 3.5–5.1)
Sodium: 138 mmol/L (ref 135–145)
Total Bilirubin: 0.4 mg/dL (ref 0.3–1.2)
Total Protein: 5.7 g/dL — ABNORMAL LOW (ref 6.5–8.1)

## 2021-08-23 LAB — TYPE AND SCREEN
ABO/RH(D): O POS
Antibody Screen: NEGATIVE
Unit division: 0

## 2021-08-23 LAB — MAGNESIUM: Magnesium: 1.6 mg/dL — ABNORMAL LOW (ref 1.7–2.4)

## 2021-08-23 LAB — CBC
HCT: 28.2 % — ABNORMAL LOW (ref 36.0–46.0)
Hemoglobin: 8.7 g/dL — ABNORMAL LOW (ref 12.0–15.0)
MCH: 26.9 pg (ref 26.0–34.0)
MCHC: 30.9 g/dL (ref 30.0–36.0)
MCV: 87.3 fL (ref 80.0–100.0)
Platelets: 434 10*3/uL — ABNORMAL HIGH (ref 150–400)
RBC: 3.23 MIL/uL — ABNORMAL LOW (ref 3.87–5.11)
RDW: 15.3 % (ref 11.5–15.5)
WBC: 7.1 10*3/uL (ref 4.0–10.5)
nRBC: 0 % (ref 0.0–0.2)

## 2021-08-23 LAB — PROTIME-INR
INR: 1.1 (ref 0.8–1.2)
Prothrombin Time: 14.4 seconds (ref 11.4–15.2)

## 2021-08-23 MED ORDER — MAGNESIUM SULFATE 2 GM/50ML IV SOLN
2.0000 g | Freq: Once | INTRAVENOUS | Status: AC
  Administered 2021-08-23: 2 g via INTRAVENOUS
  Filled 2021-08-23: qty 50

## 2021-08-23 MED ORDER — WARFARIN SODIUM 7.5 MG PO TABS
7.5000 mg | ORAL_TABLET | Freq: Once | ORAL | Status: AC
Start: 2021-08-23 — End: 2021-08-23
  Administered 2021-08-23: 7.5 mg via ORAL
  Filled 2021-08-23: qty 1

## 2021-08-23 MED ORDER — WARFARIN - PHARMACIST DOSING INPATIENT: Freq: Every day | Status: DC

## 2021-08-23 MED ORDER — TRAMADOL HCL 50 MG PO TABS
50.0000 mg | ORAL_TABLET | Freq: Two times a day (BID) | ORAL | Status: DC | PRN
  Administered 2021-08-23: 50 mg via ORAL
  Filled 2021-08-23: qty 1

## 2021-08-23 NOTE — Plan of Care (Signed)
  Problem: Clinical Measurements: Goal: Will remain free from infection Outcome: Progressing   Problem: Clinical Measurements: Goal: Diagnostic test results will improve Outcome: Progressing   Problem: Nutrition: Goal: Adequate nutrition will be maintained Outcome: Progressing   Problem: Pain Managment: Goal: General experience of comfort will improve Outcome: Progressing

## 2021-08-23 NOTE — Progress Notes (Signed)
ANTICOAGULATION CONSULT NOTE - Follow Up Consult  Pharmacy Consult for Warfarin (with Lovenox bridge) Indication: Lupus anticoagulant, DVT history   Allergies  Allergen Reactions   Vicodin [Hydrocodone-Acetaminophen] Nausea And Vomiting   Morphine Rash   Morphine And Related Rash    Allergic reaction only in iv form    Patient Measurements: Height: '5\' 6"'$  (167.6 cm) Weight: 78 kg (171 lb 15.3 oz) IBW/kg (Calculated) : 59.3 Heparin Dosing Weight: 75.3 kg    Vital Signs: Temp: 99.6 F (37.6 C) (07/02 0813) Temp Source: Oral (07/02 0813) BP: 122/65 (07/02 0813) Pulse Rate: 90 (07/02 0813)  Labs: Recent Labs    08/21/21 1406 08/22/21 0312 08/22/21 0915 08/23/21 0441  HGB 7.5* 6.8* 8.1* 8.7*  HCT 23.2* 21.0* 25.6* 28.2*  PLT 406* 340  --  434*  CREATININE 0.52 0.52  --  0.52    Estimated Creatinine Clearance: 84.8 mL/min (by C-G formula based on SCr of 0.52 mg/dL).   Medications:  Scheduled:   brivaracetam  100 mg Oral BID   Chlorhexidine Gluconate Cloth  6 each Topical Daily   enoxaparin (LOVENOX) injection  70 mg Subcutaneous Q12H   feeding supplement (PROSource TF)  45 mL Per Tube BID   free water  100 mL Per Tube Q4H   insulin aspart  0-9 Units Subcutaneous Q6H   lacosamide  200 mg Per Tube BID   levothyroxine  75 mcg Per Tube Q0600   metoprolol tartrate  25 mg Per Tube BID   OLANZapine zydis  5 mg Oral BID   mouth rinse  15 mL Mouth Rinse 4 times per day   pantoprazole sodium  40 mg Per Tube Daily   sodium chloride flush  10-40 mL Intracatheter Q12H   thiamine  100 mg Per Tube Daily   venlafaxine XR  150 mg Oral Q breakfast   Infusions:   feeding supplement (OSMOLITE 1.5 CAL) 55 mL/hr at 08/23/21 0700   magnesium sulfate bolus IVPB      Assessment: 56 yo F with hx brain aneurysm rupture and ICH s/p VP shunt, functional quadriplegia/bedbound, chronic dysphagia on pureed diet, seizure disorder, DVT, lupus anticoagulant on chronic warfarin presenting with  blood in diaper, unclear source. Last PTA warfarin dose 6/18 AM. INR >10 on presentation. Noted recently on Levaquin for infection but off for several weeks now PTA. Warfarin held and reversed with vit K on 6/26 and 6/27 so G-tube could be placed by IR. This was done 6/28, with plans to restart lovenox bridge post procedure.  During this admission lovenox was briefly held for dropping Hgb, pharmacy asked to resume on 7/1. Pharmacy now consulted for warfarin dosing, with lovenox bridge per MD. INR 1.1, Hgb 8.7, plt 434. No signs/symptoms of bleeding noted per RN.   - PTA warfarin regimen: 7.5 mg on Sundays, 10 mg on all other days  Goal of Therapy:  INR 2-3 Monitor platelets by anticoagulation protocol: Yes   Plan:  Lovenox 70 mg (~1 mg/kg) SQ q12h Warfarin 7.5 mg PO x 1 tonight. Daily INR, CBC.  Monitor for signs/symptoms of bleeding.   Vance Peper, PharmD PGY-2 Pharmacy Resident Phone [phone] 08/23/2021 8:40 AM   Please check AMION for all [place] phone numbers After 10:00 PM, call [place] [phone]

## 2021-08-23 NOTE — Progress Notes (Signed)
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  Also has dysphagia requiring PEG tube placement.  Assessment and Plan: Hematuria Supratherapeutic INR. Acute blood loss anemia. INR was more than 10 on admission.  Hemoglobin level was 9 on admission. At lowest dropped down to 6.6. Currently repeat hemoglobin is expectedly elevated. CT abdomen pelvis negative for any retroperitoneal bleed. SP 2 PRBC transfusion.  Transfuse for hemoglobin less than 7. Continue Lovenox.  Will initiate warfarin.   Acute metabolic encephalopathy. Appears to be multifocal in nature. EEG negative.  Currently mentation appears to be close to baseline after resuming her home regimen. Close to baseline.  Monitor for now.   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 Functional quadriplegia. PT OT following.  Chronic dysphagia PEG tube placement was delayed due to elevated INR. Now S/P PEG tube placement.  Currently on tube feeding Tolerating it well.   History of DVT in the past. Lupus anticoagulant positive. On chronic Coumadin.  Lovenox resumed. Coumadin resumed. Discussed at length regarding pros and cons of Coumadin versus NOACs. Patient actually had a similar discussion in the past with hematology. Recommended oral transition to Eliquis as Coumadin level has been fluctuating significantly in patient's case. Outpatient follow-up with hematology recommended for further discussion.   Hypokalemia. Corrected.   Sinus tachycardia. Most likely pain related.  Hemoglobin stable. Monitor for now.  Will initiate  scheduled Lopressor as there is no acute reversible etiology.   Fever on 6/29. Chest x-ray unremarkable.  UA unremarkable.  Blood cultures so far no growth.  No further fever.  Monitor.   Mood disorder. Agitation resolved. We will continue current regimen and monitor.   Hypothyroidism Continue Synthroid.   Seizure disorder No evidence of active seizures. Monitor. LTM negative.  Patient has missed her morning medication.  We will provide 1 dose of IV medication.  And resume home medication.  Patient was staring with tremors bilaterally which improved significantly with the use of Ativan and now with resumption of home regimen.  Subjective: More alert and awake.  Significantly interactive.  Back to baseline per family mentation wise.  No nausea or vomiting.  Reports pain in both legs.  No abdominal pain.  At goal for tube feedings.  Physical Exam: Vitals:   08/23/21 0813 08/23/21 1214 08/23/21 1643 08/23/21 2028  BP: 122/65 (!) 117/93 107/76 118/70  Pulse: 90 90 81 (!) 101  Resp: '17 17 16 18  '$ Temp: 99.6 F (37.6 C) 100 F (37.8 C) 99.3 F (37.4 C) 99.6 F (37.6 C)  TempSrc: Oral Oral Oral Oral  SpO2: 99% 100% 100% 99%  Weight: 78 kg     Height: '5\' 6"'$  (1.676 m)      General: Appear in no distress; no visible Abnormal Neck Mass Or lumps, Conjunctiva normal Cardiovascular: S1 and S2 Present, no Murmur, Respiratory: good respiratory effort, Bilateral Air entry present and CTA, no Crackles, no wheezes Abdomen: Bowel Sound present, Non tender  Extremities:  no Pedal edema Neurology: alert and oriented to place and person Gait not checked due to patient safety concerns   Data Reviewed: I have Reviewed nursing notes, Vitals, and Lab results since pt's last encounter.  Pertinent lab results CBC and BMP I have ordered test including CBC and BMP I have reviewed the last note from outpatient hematology documentation,    Family Communication: Mother and another family member at  bedside as well as on the phone  Disposition: Status is: Inpatient Remains inpatient appropriate because: Monitor while on Coumadin for stability.  Author: Berle Mull, MD 08/23/2021 8:36 PM  Please look on [email or web address] to find out who is on call.

## 2021-08-24 DIAGNOSIS — R58 Hemorrhage, not elsewhere classified: Secondary | ICD-10-CM | POA: Diagnosis not present

## 2021-08-24 LAB — GLUCOSE, CAPILLARY
Glucose-Capillary: 156 mg/dL — ABNORMAL HIGH (ref 70–99)
Glucose-Capillary: 170 mg/dL — ABNORMAL HIGH (ref 70–99)
Glucose-Capillary: 185 mg/dL — ABNORMAL HIGH (ref 70–99)
Glucose-Capillary: 195 mg/dL — ABNORMAL HIGH (ref 70–99)

## 2021-08-24 LAB — BASIC METABOLIC PANEL
Anion gap: 5 (ref 5–15)
BUN: 14 mg/dL (ref 6–20)
CO2: 30 mmol/L (ref 22–32)
Calcium: 8.6 mg/dL — ABNORMAL LOW (ref 8.9–10.3)
Chloride: 100 mmol/L (ref 98–111)
Creatinine, Ser: 0.58 mg/dL (ref 0.44–1.00)
GFR, Estimated: >60 mL/min (ref >60)
Glucose, Bld: 151 mg/dL — ABNORMAL HIGH (ref 70–99)
Potassium: 4.6 mmol/L (ref 3.5–5.1)
Sodium: 135 mmol/L (ref 135–145)

## 2021-08-24 LAB — CBC
HCT: 27.4 % — ABNORMAL LOW (ref 36.0–46.0)
Hemoglobin: 8.4 g/dL — ABNORMAL LOW (ref 12.0–15.0)
MCH: 26.8 pg (ref 26.0–34.0)
MCHC: 30.7 g/dL (ref 30.0–36.0)
MCV: 87.5 fL (ref 80.0–100.0)
Platelets: 467 10*3/uL — ABNORMAL HIGH (ref 150–400)
RBC: 3.13 MIL/uL — ABNORMAL LOW (ref 3.87–5.11)
RDW: 15.8 % — ABNORMAL HIGH (ref 11.5–15.5)
WBC: 7.5 10*3/uL (ref 4.0–10.5)
nRBC: 0 % (ref 0.0–0.2)

## 2021-08-24 LAB — MAGNESIUM: Magnesium: 2.1 mg/dL (ref 1.7–2.4)

## 2021-08-24 LAB — PHOSPHORUS: Phosphorus: 3.5 mg/dL (ref 2.5–4.6)

## 2021-08-24 LAB — PROTIME-INR
INR: 1.1 (ref 0.8–1.2)
Prothrombin Time: 13.6 seconds (ref 11.4–15.2)

## 2021-08-24 MED ORDER — WARFARIN SODIUM 5 MG PO TABS: 10.0000 mg | ORAL_TABLET | Freq: Once | ORAL | Status: DC

## 2021-08-24 MED ORDER — FREE WATER
120.0000 mL | Freq: Every day | Status: DC
  Administered 2021-08-24 – 2021-08-25 (×7): 120 mL

## 2021-08-24 MED ORDER — OSMOLITE 1.5 CAL PO LIQD
237.0000 mL | Freq: Every day | ORAL | Status: DC
  Administered 2021-08-24 – 2021-08-25 (×6): 237 mL
  Filled 2021-08-24 (×3): qty 237

## 2021-08-24 MED ORDER — ENOXAPARIN SODIUM 60 MG/0.6ML IJ SOSY
60.0000 mg | PREFILLED_SYRINGE | Freq: Two times a day (BID) | INTRAMUSCULAR | Status: DC
  Administered 2021-08-24 – 2021-08-25 (×2): 60 mg via SUBCUTANEOUS
  Filled 2021-08-24 (×2): qty 0.6

## 2021-08-24 MED ORDER — WARFARIN SODIUM 2.5 MG PO TABS
12.5000 mg | ORAL_TABLET | Freq: Once | ORAL | Status: AC
  Administered 2021-08-24: 12.5 mg via ORAL
  Filled 2021-08-24: qty 1

## 2021-08-24 MED ORDER — ENOXAPARIN (LOVENOX) PATIENT EDUCATION KIT
PACK | Freq: Once | Status: AC
Start: 2021-08-24 — End: 2021-08-24
  Filled 2021-08-24: qty 1

## 2021-08-24 NOTE — TOC Progression Note (Signed)
Transition of Care [facility]) - Progression Note    Patient Details  Name: Cassandra Andrade MRN: [id number] Date of Birth: July 30, 1965  [facility]) CM/SW Contact  Jacalyn Lefevre Edson Snowball, RN Phone Number: 08/24/2021, 12:30 PM  Clinical Narrative:    Anticipated discharge date is tomorrow. Pam with [facility] and Levada Dy with [facility] aware    Expected Discharge Plan: [place]    Expected Discharge Plan and Services Expected Discharge Plan: [place]   Discharge Planning Services: CM Consult Post Acute Care Choice: [place] arrangements for the past 2 months: Single Family Home                 DME Arranged: Tube feeding         HH Arranged: RN           Social Determinants of Health (SDOH) Interventions    Readmission Risk Interventions     No data to display

## 2021-08-24 NOTE — Plan of Care (Signed)
  Problem: Nutrition: Goal: Adequate nutrition will be maintained Outcome: Progressing   Problem: Pain Managment: Goal: General experience of comfort will improve Outcome: Progressing   Problem: Safety: Goal: Ability to remain free from injury will improve Outcome: Progressing   

## 2021-08-24 NOTE — Discharge Instructions (Signed)
Bolus or Syringe Tube  Feeding Instructions A feeding tube can give you nutrition and fluids when cancer or its  treatments stop you from eating enough by mouth or absorbing enough  nutrition through your gastrointestinal (GI) tract. This handout explains how  to use your feeding tube for hydration and nutrition. How to Flush Your Feeding Tube Even if you are not using your feeding tube for nutrition, it is important to flush it with  60 milliliters (mL) or about 2 ounces or  cup of water once a day to keep the feeding  tube clean and working. This will keep a clog from forming inside the feeding tube. Supplies needed: 60-mL syringe, water, clean container, towel 1. Wash your hands with soap and water for 20 to 30 seconds. 2. Place a towel down on your lap under the feeding tube to catch any spills or  splatters. 3. Fill a clean container with warm or room-temperature water. Place the tip of a  60-mL syringe in the water (make sure you take the clear plastic cap off the  syringe), and pull the plunger back to draw 30 to 60 mL of water into the syringe. 4. Pinch, bend, or use the clamp at the end of the feeding tube to keep the contents  from leaking out. Open the cap on the feeding tube, then insert the tip of the syringe  into the feeding tube. 5. Unpinch or unclamp the tube and gently push the plunger of the syringe down to  allow the water to flow into the tube 6. When the syringe is empty, pinch or bend the tube to keep the contents from  leaking out. Remove the syringe from the tube and close the cap on the feeding tube. Basic Tips for Using a Feeding Tube " Always wash your hands with soap and water for 20 to 30 seconds before touching  the feeding tube and giving feedings. " Always flush the feeding tube with water before and after each feeding and each  medication. " Keep your head and chest raised higher than your stomach when feeding. Sit  at about a 45-degree angle. Do not  lie down flat. Avoid lying down for 30 to  60 minutes after a feeding. Copyright  2021 Academy of [employer]etics.  This handout may be reproduced for education purposes. How to Clean Around Your Feeding Tube Follow these directions every day to clean around your feeding tube: 1. Wash your hands with mild soap and water and dry them with a clean towel. 2. Wet a soft, clean cloth or gauze with warm, mild, soapy water. 3. Gently wipe the skin around the tube with the cloth. Also clean the base of the tube. 4. Carefully clean the skin under the bumper with the cloth. Do not pull on the feeding tube. 5. Look for redness, swelling, bleeding, or leakage around the tube. If you notice any of these things, report them  to your health care team immediately. Do not wait another day. 6. Rinse the area you cleaned with clear, warm water. (It is also safe to rinse off in the shower.) 7. Pat the area dry with another clean, soft cloth. 8. Apply a protective skin barrier or antibacterial ointment to the area around the tube if you have been  told to do so by your health care team. 9. Gently push the base of the tube against the skin and twist the tube in a circle. This step keeps the tube  from sticking to the inside of the stomach. 10. When you are finished, wash your hands again with mild soap and water and dry them with a clean  towel. How to Give Bolus (Syringe) Tube Feedings Bolus (syringe) tube feedings are usually given multiple times a day, several hours apart. Generally, they  are used for feeding tubes placed into the stomach (G-tubes). Bolus tube feeds should not be used for  feeding tubes placed into the small intestine. This will cause GI intolerance, such as nausea, bloating, or  diarrhea. Supplies needed: 60-mL syringe, water, formula, clean container, towel Follow these steps for a bolus tube feeding: 1. Wash your hands with mild soap and water for 20 to 30 seconds. 2. Place a  towel down on your lap under the feeding tube to catch any spills or splatters. 3. Gently shake the room-temperature formula container, then open the can or carton of formula you  need for the feeding. 4. Flush the feeding tube with at least 30 mL of warm water before the feeding. (See the "How to Flush  Your Feeding Tube" section for instructions.) 5. Remove the plunger from the syringe and reinsert the syringe tip into the feeding tube. 6. Keep the syringe in the feeding tube and pour formula into the syringe until the syringe is halfway full  (about 2 ounces or 20 mL). Allow the syringe contents to flow into the feeding tube. Continue filling the  syringe halfway with formula until the amount prescribed for the feeding has been given. Go slowly: It  should take about 15 to 20 minutes for one carton (8 ounces) of formula. 7. Once the feeding is finished, flush the tube with at least 30 mL of warm water. 8. Pinch the tube, remove the syringe, and close the cap on the feeding tube. 9. Tape the tube to your stomach with medical paper tape. 10. Cover and store opened containers of leftover formula, if any, in the refrigerator to prevent contamination  or spoilage. Before the next feeding, allow the refrigerated formula to warm up to room temperature before  using it for another bolus tube feeding

## 2021-08-24 NOTE — Progress Notes (Addendum)
ANTICOAGULATION CONSULT NOTE  Pharmacy Consult:  Lovenox/Coumadin Indication: Lupus anticoagulant, DVT history   Allergies  Allergen Reactions   Vicodin [Hydrocodone-Acetaminophen] Nausea And Vomiting   Morphine Rash   Morphine And Related Rash    Allergic reaction only in iv form    Patient Measurements: Height: '5\' 6"'$  (167.6 cm) Weight: 61.7 kg (136 lb) (used the lift to zero the bed then weigh her) IBW/kg (Calculated) : 59.3  Vital Signs: Temp: 98.4 F (36.9 C) (07/03 0750) Temp Source: Oral (07/03 0750) BP: 111/70 (07/03 0750) Pulse Rate: 89 (07/03 0750)  Labs: Recent Labs    08/22/21 0312 08/22/21 0915 08/23/21 0441 08/23/21 0949 08/24/21 0259  HGB 6.8* 8.1* 8.7*  --  8.4*  HCT 21.0* 25.6* 28.2*  --  27.4*  PLT 340  --  434*  --  467*  LABPROT  --   --   --  14.4 13.6  INR  --   --   --  1.1 1.1  CREATININE 0.52  --  0.52  --  0.58     Estimated Creatinine Clearance: 75.3 mL/min (by C-G formula based on SCr of 0.58 mg/dL).  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 INR >10 on presentation. Noted recently on Levaquin for infection but off for several weeks now PTA. Warfarin held and reversed with Vitamin K on 6/20, 6/26 and 6/27 in setting of fluconazole administration so G-tube could be placed by IR. This was done 6/28 and patient is currently on Lovenox bridge to Coumadin per pharmacy.  INR sub-therapeutic at 1.1; renal function stable; CBC stabilizing post transfusion; no bleeding reported.  Will likely require a longer time to get patient therapeutic again s/p Vitamin K administration.  Patient's weight varies dramatically, ranging from 61.7 to 95 kg.  Unable to get a standing weight given quadriplegia status.  Would err on the safe side to dose Lovenox  using the lowest recorded weight given recent transfusion need.  Home Coumadin regimen: '10mg'$  daily except 7.'5mg'$  on Sun  Goal of Therapy:  INR 2-3 Anti-Xa level 0.6-1 units/ml 4hrs after LMWH dose given Monitor platelets by anticoagulation protocol: Yes   Plan:  Reduce Lovenox to '60mg'$  SQ Q12H Coumadin 12.'5mg'$  PO today Daily PT / INR, PRN CBC Monitor for signs/symptoms of bleeding

## 2021-08-24 NOTE — Progress Notes (Addendum)
Progress Note Patient: Cassandra Andrade NAT:[id number] DOB: 12/11/1965 DOA: 08/11/2021  DOS: the patient was seen and examined on 08/24/2021  Brief hospital course: 56 year old female with past medical history of aneurysm of the rupture SP VP shunt in 2004, lupus anticoagulant on Coumadin, seizures, chronic dysphagia, functional quadriplegia, DVT, right BKA presents to the hospital with complaints of confusion, GI/vaginal bleeding.  Also has dysphagia requiring PEG tube placement.  Assessment and Plan: Hematuria Supratherapeutic INR. Acute blood loss anemia. INR was more than 10 on admission.  Hemoglobin level was 9 on admission. At lowest dropped down to 6.6. Currently repeat hemoglobin is expectedly elevated. CT abdomen pelvis negative for any retroperitoneal bleed. SP 2 PRBC transfusion.  Transfuse for hemoglobin less than 7. Continue Lovenox.  Continue warfarin.   Acute metabolic encephalopathy. Appears to be multifocal in nature. EEG negative.  Currently mentation appears to be close to baseline after resuming her home regimen. Close to baseline.  Monitor for now.   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 Functional quadriplegia. PT OT following.  Chronic dysphagia PEG tube placement was delayed due to elevated INR. Now S/P PEG tube placement.  Currently on tube feeding Tolerating it well.   History of DVT in the past. Lupus anticoagulant positive. On chronic Coumadin.  Lovenox resumed. Coumadin resumed. Discussed at length regarding pros and cons of Coumadin versus NOACs. Patient actually had a similar discussion in the past with hematology. Recommended oral transition to Eliquis as Coumadin level has been fluctuating significantly in patient's case. Outpatient follow-up with hematology recommended for further discussion.   Hypokalemia. Corrected.   Sinus tachycardia. Most likely pain related.  Hemoglobin stable. Monitor for now.  Will initiate scheduled  Lopressor as there is no acute reversible etiology.   Fever on 6/29. Chest x-ray unremarkable.  UA unremarkable.  Blood cultures so far no growth.  No further fever.  Monitor.   Mood disorder. Agitation resolved. We will continue current regimen and monitor.   Hypothyroidism Continue Synthroid.   Seizure disorder No evidence of active seizures. Monitor. LTM negative.  resume home medication.  Patient was staring with tremors bilaterally which improved significantly with the use of Ativan and now with resumption of home regimen.  Subjective: Mentation continues to improve.  Pain in the legs resolved.  No nausea no vomiting.  Mother had her education about tube feeding this morning.  Physical Exam: Vitals:   08/24/21 0924 08/24/21 1025 08/24/21 1229 08/24/21 1603  BP:  123/73 112/62 117/70  Pulse:  88 83 (!) 101  Resp:   18 18  Temp:   97.9 F (36.6 C) 98.2 F (36.8 C)  TempSrc:   Oral Oral  SpO2:   93% 99%  Weight: 61.7 kg     Height:       General: Appear in mild distress, no Rash; Oral Mucosa Clear, moist. no Abnormal Neck Mass Or lumps, Conjunctiva normal  Cardiovascular: S1 and S2 Present, no Murmur, Respiratory: good respiratory effort, Bilateral Air entry present and CTA, no Crackles, no wheezes Abdomen: Bowel Sound present, Soft and no tenderness Extremities: no Pedal edema, right BKA Neurology: alert and oriented to time, place, and person affect appropriate. no new focal deficit Gait not checked due to patient safety concerns    Data Reviewed: I have Reviewed nursing notes, Vitals, and Lab results since pt's last encounter. Pertinent lab results CBC and BMP I have ordered test including CBC and BMP     Family Communication: No one at  bedside.  Disposition: Status is: Inpatient Remains inpatient appropriate because: Monitor for tolerance of bolus regimen for tube feeding.  Discharge on 7/4.  Author: Lynden Oxford, MD 08/24/2021 7:00 PM  Please look on  [email or web address] to find out who is on call.

## 2021-08-24 NOTE — Progress Notes (Addendum)
Nutrition Follow-up  INTERVENTION:   Continue current diet as ordered per SLP  Transition to bolus feeds:  Osmolite 1.5, 6 cartons/d 50mL of free water before and after each bolus (158mL 6x/d) Provide 2130kcal, 89g of protein, and 1852mL of free water  Check Phosphorus level - WNL  Provide additional education handouts on bolus feeding for family on AVS    NUTRITION DIAGNOSIS:  Moderate Malnutrition related to chronic illness as evidenced by mild fat depletion, mild muscle depletion. Ongoing.   GOAL:  Patient will meet greater than or equal to 90% of their needs Met with TF at goal   MONITOR:  TF tolerance, PO intake  REASON FOR ASSESSMENT:  New TF    ASSESSMENT:  Pt with hx osteosarcoma s/p right BKA, intracranial hemorrhage s/p ventricular shunt, and seizure disorder presented to ED after mom noted blood in her pull up. Pt on pured diet at baseline but mom reports poor intake since last hospitalization. Functional quadriplegia and bedbound.  Pt receiving continuous TF at goal rate, asked to transition to bolus feeding for d/c home.   Pt allowed Dysphagia 1 diet with thin liquids as able per SLP. Pt with minimal PO intake and required PEG placement to maintain adequate nutrition. RN and tech both confirm pt had not ate any PO's today despite efforts to feed her. Pt is unable to meet 100% of her nutrition needs with PO intake alone and requires PEG feedings to meeting nutrition needs.  Pt has tolerated continuous TF and will now transition to bolus feedings in anticipation of d/c home.   Discussed plan with RN at bedside.  No family present during visit.   6/28 s/p 20 F PEG placement  Labs reviewed:  Phosphorus 1.7 (6/30) Magnesium: 2.1 Potassium: 4.6 CBG's: 113-197  Medications reviewed and include: SSI every 6 hours, protoinx, thiamine 100 mg (6/30-7/5)   Weight trends 136 lb - 209 lb suspect inaccurate  NFPE completed 08/21/21 Flowsheet Row Most Recent Value   Orbital Region Severe depletion  Upper Arm Region Mild depletion  Thoracic and Lumbar Region Mild depletion  Buccal Region Severe depletion  Temple Region Severe depletion  Clavicle Bone Region Mild depletion  Clavicle and Acromion Bone Region Mild depletion  Scapular Bone Region No depletion  Dorsal Hand No depletion  Patellar Region No depletion  Anterior Thigh Region No depletion  Posterior Calf Region Severe depletion  Edema (RD Assessment) Moderate  [right arm, left thigh]  Hair Reviewed  Eyes Reviewed  Mouth Reviewed  Skin Reviewed  Nails Reviewed        Diet Order:   Diet Order             DIET - DYS 1 Room service appropriate? Yes with Assist; Fluid consistency: Thin  Diet effective now                   EDUCATION NEEDS:  Not appropriate for education at this time  Skin:  Skin Assessment: Reviewed RN Assessment (stage 4 to the left pretibial region, stage 2 to the perineum)  Last BM:  7/1 - medium type 6  Height:  Ht Readings from Last 1 Encounters:  08/23/21 $RemoveB'5\' 6"'orQOrnlS$  (1.676 m)    Weight:  Wt Readings from Last 1 Encounters:  08/24/21 61.7 kg    Ideal Body Weight:  53.2 kg ((adjusted by 10% for AKA))  BMI:  Body mass index is 21.95 kg/m.  Estimated Nutritional Needs:  Kcal:  1900-2100 kcal/d Protein:  100-120g/d Fluid:  >/=  1.9L/d  Lockie Pares., RD, LDN, CNSC See AMiON for contact information

## 2021-08-25 LAB — CBC
HCT: 26.8 % — ABNORMAL LOW (ref 36.0–46.0)
Hemoglobin: 8.5 g/dL — ABNORMAL LOW (ref 12.0–15.0)
MCH: 27.7 pg (ref 26.0–34.0)
MCHC: 31.7 g/dL (ref 30.0–36.0)
MCV: 87.3 fL (ref 80.0–100.0)
Platelets: 466 10*3/uL — ABNORMAL HIGH (ref 150–400)
RBC: 3.07 MIL/uL — ABNORMAL LOW (ref 3.87–5.11)
RDW: 15.9 % — ABNORMAL HIGH (ref 11.5–15.5)
WBC: 8.7 10*3/uL (ref 4.0–10.5)
nRBC: 0 % (ref 0.0–0.2)

## 2021-08-25 LAB — BASIC METABOLIC PANEL
Anion gap: 12 (ref 5–15)
BUN: 16 mg/dL (ref 6–20)
CO2: 28 mmol/L (ref 22–32)
Calcium: 8.9 mg/dL (ref 8.9–10.3)
Chloride: 94 mmol/L — ABNORMAL LOW (ref 98–111)
Creatinine, Ser: 0.57 mg/dL (ref 0.44–1.00)
GFR, Estimated: >60 mL/min (ref >60)
Glucose, Bld: 163 mg/dL — ABNORMAL HIGH (ref 70–99)
Potassium: 4.6 mmol/L (ref 3.5–5.1)
Sodium: 134 mmol/L — ABNORMAL LOW (ref 135–145)

## 2021-08-25 LAB — CULTURE, BLOOD (ROUTINE X 2)
Culture: NO GROWTH
Culture: NO GROWTH
Special Requests: ADEQUATE
Special Requests: ADEQUATE

## 2021-08-25 LAB — GLUCOSE, CAPILLARY
Glucose-Capillary: 135 mg/dL — ABNORMAL HIGH (ref 70–99)
Glucose-Capillary: 166 mg/dL — ABNORMAL HIGH (ref 70–99)
Glucose-Capillary: 180 mg/dL — ABNORMAL HIGH (ref 70–99)

## 2021-08-25 LAB — MAGNESIUM: Magnesium: 1.7 mg/dL (ref 1.7–2.4)

## 2021-08-25 LAB — PROTIME-INR
INR: 1.1 (ref 0.8–1.2)
Prothrombin Time: 13.9 seconds (ref 11.4–15.2)

## 2021-08-25 MED ORDER — OSMOLITE 1.5 CAL PO LIQD: 237.0000 mL | Freq: Every day | ORAL | 0 refills | Status: AC

## 2021-08-25 MED ORDER — LEVOTHYROXINE SODIUM 75 MCG PO TABS: 75.0000 ug | ORAL_TABLET | Freq: Every day | ORAL | 0 refills | Status: AC

## 2021-08-25 MED ORDER — ENOXAPARIN SODIUM 60 MG/0.6ML IJ SOSY: 60.0000 mg | PREFILLED_SYRINGE | Freq: Two times a day (BID) | INTRAMUSCULAR | 0 refills | Status: AC

## 2021-08-25 MED ORDER — FREE WATER: 120.0000 mL | Freq: Every day | 0 refills | Status: AC

## 2021-08-25 MED ORDER — METOPROLOL TARTRATE 25 MG PO TABS: 12.5000 mg | ORAL_TABLET | Freq: Two times a day (BID) | ORAL | 0 refills | Status: AC

## 2021-08-25 MED ORDER — TRAMADOL HCL 50 MG PO TABS: 50.0000 mg | ORAL_TABLET | Freq: Two times a day (BID) | ORAL | 0 refills | Status: AC | PRN

## 2021-08-25 MED ORDER — WARFARIN SODIUM 10 MG PO TABS: 10.0000 mg | ORAL_TABLET | Freq: Every day | ORAL | 0 refills | Status: AC

## 2021-08-25 MED ORDER — LAMOTRIGINE 25 MG PO TABS
350.0000 mg | ORAL_TABLET | Freq: Two times a day (BID) | ORAL | Status: DC
  Administered 2021-08-25: 350 mg via ORAL
  Filled 2021-08-25: qty 2

## 2021-08-25 MED ORDER — WARFARIN SODIUM 2.5 MG PO TABS
12.5000 mg | ORAL_TABLET | Freq: Once | ORAL | Status: DC
  Filled 2021-08-25: qty 1

## 2021-08-25 NOTE — Progress Notes (Signed)
Speech Language Pathology Treatment: Dysphagia  Patient Details Name: Cassandra Andrade MRN: [id number] DOB: April 30, 1965 Today's Date: 08/25/2021 Time: 7564-3329 SLP Time Calculation (min) (ACUTE ONLY): 22 min  Assessment / Plan / Recommendation Clinical Impression  Pt is alert and cooperative this morning, ready to start her breakfast tray and trying to assist with self-feeding. She is verbally making selections about her meal when given choices and is talking about eating clementines at home. More solid trials were given with mastication efforts observed, but with oral residue and intermittent coughing noted with these more advanced solids. Oral transit time appears to be somewhat slowed but not significantly different between purees and small bites of soft solids, although more effective with purees. Dys 1 and thin liquids still seem to be the most appropriate for now, but will f/u for potential to advance acutely, particularly if mentation remains more appropriate and we can continue to work on oral preparation.   HPI HPI: Patient is a 56 y.o. 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 Patient recently admitted 5/23-5/29/23 with acute metabolic encephalopathy. Patient presented to the hospital on 08/11/21 and mother reported during current admission that patient has not been eating much since being discharged, has not had a BM in past couple of days, intermittently refusing pills/holding pills in mouth. In addition, mother noticing blood when changing patient's pull up diaper. In ED, patient with temperature of 99.7 degrees F, RR and oxygen saturations WFL. CT head did not reveal any acute abnormality with extensive encephalomalacia related to prior craniotomy. G-tube placed  6/28      SLP Plan  Continue with current plan of care      Recommendations for follow up therapy are one component of a multi-disciplinary discharge planning process, led by the attending physician.  Recommendations may be updated based on patient status, additional functional criteria and insurance authorization.    Recommendations  Diet recommendations: Dysphagia 1 (puree);Thin liquid Liquids provided via: Straw;Cup Medication Administration: Whole meds with puree Supervision: Full supervision/cueing for compensatory strategies;Staff to assist with self feeding Compensations: Minimize environmental distractions Postural Changes and/or Swallow Maneuvers: Seated upright 90 degrees                Oral Care Recommendations: Oral care BID Follow Up Recommendations: Skilled nursing-short term rehab (<3 hours/day) Assistance recommended at discharge: Frequent or constant Supervision/Assistance SLP Visit Diagnosis: Dysphagia, unspecified (R13.10) Plan: Continue with current plan of care           Osie Bond., M.A. [place] Office [phone]  Secure chat preferred   08/25/2021, 9:54 AM

## 2021-08-25 NOTE — Progress Notes (Signed)
ANTICOAGULATION CONSULT NOTE - Follow Up Consult  Pharmacy Consult for Enoxaparin and Warfarin Indication:  lupus anticoagulant, hx DVT  Allergies  Allergen Reactions   Vicodin [Hydrocodone-Acetaminophen] Nausea And Vomiting   Morphine Rash   Morphine And Related Rash    Allergic reaction only in iv form    Patient Measurements: Height: '5\' 6"'$  (167.6 cm) Weight: 64.9 kg (143 lb) IBW/kg (Calculated) : 59.3   Vital Signs: Temp: 98.4 F (36.9 C) (07/04 0819) Temp Source: Oral (07/04 0819) BP: 119/73 (07/04 0819) Pulse Rate: 100 (07/04 0819)  Labs: Recent Labs    08/23/21 0441 08/23/21 0949 08/24/21 0259 08/25/21 0252  HGB 8.7*  --  8.4* 8.5*  HCT 28.2*  --  27.4* 26.8*  PLT 434*  --  467* 466*  LABPROT  --  14.4 13.6 13.9  INR  --  1.1 1.1 1.1  CREATININE 0.52  --  0.58 0.57    Estimated Creatinine Clearance: 75.3 mL/min (by C-G formula based on SCr of 0.57 mg/dL).  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 INR >10 on presentation. Noted recently on Levaquin for infection but off for several weeks now PTA. Warfarin held and reversed with Vitamin K on 6/20, 6/26 and 6/27 in setting of fluconazole administration so G-tube could be placed by IR. This was done 6/28 and patient is currently on Enoxaparin bridge to Warfarin per pharmacy. Warfarin resumed 08/23/21.   INR remains subtherapeutic at 1.1 after Warfarin 7.5 then 12.5 mg. CBC stable, post transfusion (last 7/1); no bleeding reported.  Will likely require a longer time to get patient therapeutic again s/p Vitamin K administration.   Patient's weight varies dramatically, ranging from 61.7 to 95 kg.  Unable to get a standing weight given quadriplegia status.  Would err on the safe side to dose Lovenox using the  lowest recorded weight given recent transfusion need.   Home Coumadin regimen: 10 mg daily except 7.5 mg on Sun  Goal of Therapy:  INR 2-3 Anti-Xa level 0.6-1 units/ml 4hrs after LMWH dose given Monitor platelets by anticoagulation protocol: Yes   Plan:  Continue Enoxaparin 60 mg SQ q12h Warfarin 12.5 mg PO x 1 again today. Daily PT/INR. Prn CBC. Monitor for signs/symptoms of bleeding. Stop Enoxaparin when INR >/= 2.0.  Arty Baumgartner, RPh 08/25/2021,8:57 AM

## 2021-08-25 NOTE — TOC Progression Note (Addendum)
Transition of Care [facility]) - Progression Note    Patient Details  Name: Robynne Roat MRN: [id number] Date of Birth: 29-May-1965  Transition of Care [facility] CM/SW Contact  Jacalyn Lefevre Edson Snowball, RN Phone Number: 08/25/2021, 12:15 PM  Clinical Narrative:    Spoke to patient's mother via phone , she   the tube feedings at home. She has given Lovenox in the past. She is good for Aala to come home by PTAR.    Asked nurse to call  her mother and go over DC instructions .   PTAR paperwork in chart , will call when nurse says she is ready   Called Angie with [place] [place] regarding discharge   1325 PTAR has been called   Expected Discharge Plan: [place]    Expected Discharge Plan and Services Expected Discharge Plan: [place]   Discharge Planning Services: CM Consult Post Acute Care Choice: [place] arrangements for the past 2 months: Single Family Home                 DME Arranged: Tube feeding         HH Arranged: RN           Social Determinants of Health (SDOH) Interventions    Readmission Risk Interventions     No data to display

## 2021-08-25 NOTE — Progress Notes (Signed)
Called discharge instructions to mother Ronita Hipps.  Patient's mother verbalized understanding of instructions.  Questions answered.  Encouraged to call the doctor for concerns.  Awaiting PTAR for transport home.

## 2021-08-25 NOTE — Plan of Care (Signed)
  Problem: Clinical Measurements: Goal: Will remain free from infection Outcome: Not Progressing   Problem: Nutrition: Goal: Adequate nutrition will be maintained Outcome: Not Progressing   Problem: Elimination: Goal: Will not experience complications related to bowel motility Outcome: Not Progressing   Problem: Safety: Goal: Ability to remain free from injury will improve Outcome: Not Progressing   Problem: Skin Integrity: Goal: Risk for impaired skin integrity will decrease Outcome: Not Progressing

## 2021-08-28 ENCOUNTER — Telehealth: Payer: Self-pay | Admitting: Neurology

## 2021-08-28 NOTE — Telephone Encounter (Signed)
..   Pt understands that although there may be some limitations with this type of visit, we will take all precautions to reduce any security or privacy concerns.  Pt understands that this will be treated like an in office visit and we will file with pt's insurance, and there may be a patient responsible charge related to this service. ? ?

## 2021-08-29 NOTE — Discharge Summary (Signed)
Physician Discharge Summary   Patient: Cassandra Andrade MRN: [id number] DOB: 1966-02-13  Admit date:     08/11/2021  Discharge date: 08/25/2021  Discharge Physician: Berle Mull  PCP: Berkley Harvey, NP  Recommendations at discharge: Follow-up with PCP. Follow-up with neurology. Follow-up with IR as needed.  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  Also has dysphagia requiring PEG tube placement.  Assessment and Plan: Hematuria Supratherapeutic INR. Acute blood loss anemia. INR was more than 10 on admission.  Hemoglobin level was 9 on admission. At lowest dropped down to 6.6. Currently repeat hemoglobin is expectedly elevated. CT abdomen pelvis negative for any retroperitoneal bleed. SP 2 PRBC transfusion.  Transfuse for hemoglobin less than 7. Continue Lovenox.  Continue warfarin.  Recommend INR check twice next week. Also recommend to follow-up with hematology to consider NOACs.   Acute metabolic encephalopathy. Appears to be multifocal in nature. EEG negative.  Currently mentation appears to be close to baseline after resuming her home regimen. Close to baseline.  Monitor for now.  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 Functional quadriplegia. PT OT following.  Chronic dysphagia PEG tube placement was delayed due to elevated INR. Now S/P PEG tube placement.  Currently on tube feeding Tolerating it well.   History of DVT in the past. Lupus anticoagulant positive. On  chronic Coumadin.  Lovenox resumed. Coumadin resumed. Discussed at length regarding pros and cons of Coumadin versus NOACs. Patient actually had a similar discussion in the past with hematology. Recommended oral transition to Eliquis as Coumadin level has been fluctuating significantly in patient's case. Outpatient follow-up with hematology recommended for further discussion.   Hypokalemia. Corrected.   Sinus tachycardia. Most likely pain related.  Hemoglobin stable. Monitor for now.  Will initiate scheduled Lopressor as there is no acute reversible etiology.   Fever on 6/29. Chest x-ray unremarkable.  UA unremarkable.  Blood cultures so far no growth.  No further fever.  Monitor.   Mood disorder. Agitation resolved. We will continue current regimen and monitor.   Hypothyroidism Continue Synthroid.   Seizure disorder No evidence of active seizures. EEG negative.  resume home medication.  Patient was staring with tremors bilaterally which improved significantly with the use of Ativan and now with resumption of home regimen.   Consultants: IR, urology, OB/GYN Procedures performed:  EEG IR guided percutaneous gastrostomy tube placement.  DISCHARGE MEDICATION: Allergies as of 08/25/2021       Reactions   Vicodin [hydrocodone-acetaminophen] Nausea And Vomiting   Morphine Rash   Morphine And Related Rash   Allergic reaction only in iv form        Medication List     STOP taking these medications    levofloxacin 250 MG tablet Commonly known as: LEVAQUIN       TAKE these medications    acetaminophen 650 MG CR tablet Commonly known as: TYLENOL Take 650 mg by mouth every 8 (eight) hours as needed for pain.   Briviact 100 MG Tabs tablet Generic drug: brivaracetam Take 100 mg by mouth 2 (two)  times daily.   clonazePAM 1 MG tablet Commonly known as: KLONOPIN One tablet twice daily as needed for seizure. What changed:  how much to take how to take this when to  take this reasons to take this   enoxaparin 60 MG/0.6ML injection Commonly known as: LOVENOX Inject 0.6 mLs (60 mg total) into the skin every 12 (twelve) hours for 5 days.   feeding supplement (OSMOLITE 1.5 CAL) Liqd Place 237 mLs into feeding tube 6 (six) times daily.   free water Soln Place 120 mLs into feeding tube 6 (six) times daily.   ketoconazole 2 % shampoo Commonly known as: NIZORAL Apply 1 application topically 2 (two) times a week. Monday,wednesday   lacosamide 200 MG Tabs tablet Commonly known as: VIMPAT Take 1 tablet (200 mg total) by mouth 2 (two) times daily.   lamoTRIgine 25 MG tablet Commonly known as: LaMICtal Take 2 tablets (50 mg total) by mouth 2 (two) times daily along with 300 mg 2 (two) times daily. Total of 350 mg twice daily (300 mg+ 50 mg) What changed:  how much to take how to take this when to take this   lamoTRIgine 100 MG tablet Commonly known as: LAMICTAL Take 3 tablets (300 mg total) by mouth 2 (two) times daily along with 50 mg 2 (two) times daily Total of 350 mg twice daily (300 mg+ 50 mg) What changed:  how much to take how to take this when to take this   levothyroxine 75 MCG tablet Commonly known as: SYNTHROID Place 1 tablet (75 mcg total) into feeding tube daily at 6 (six) AM. What changed:  medication strength how much to take how to take this when to take this   metoprolol tartrate 25 MG tablet Commonly known as: LOPRESSOR Take 0.5 tablets (12.5 mg total) by mouth 2 (two) times daily.   OLANZapine zydis 5 MG disintegrating tablet Commonly known as: ZYPREXA Take 5 mg by mouth 2 (two) times daily.   omeprazole 20 MG capsule Commonly known as: PRILOSEC Take 20 mg by mouth at bedtime.   promethazine 25 MG tablet Commonly known as: PHENERGAN Take 25 mg by mouth every 8 (eight) hours as needed for nausea or vomiting.   traMADol 50 MG tablet Commonly known as: ULTRAM Take 1 tablet (50 mg total) by mouth every 12  (twelve) hours as needed for moderate pain or severe pain.   venlafaxine XR 150 MG 24 hr capsule Commonly known as: EFFEXOR-XR Take 1 capsule (150 mg total) by mouth daily with breakfast.   vitamin C 1000 MG tablet Take 1,000 mg by mouth daily.   warfarin 10 MG tablet Commonly known as: COUMADIN Take 1 tablet (10 mg total) by mouth daily at 4 PM. What changed:  medication strength how much to take when to take this additional instructions        Follow-up Information     [facility] Follow up.   Specialty: Home Health Services Why: Merchandiser, retail information: [place] [address] [phone]         [facility] Follow up.   Why: [facility] supplying tube feeds        Berkley Harvey, NP. Schedule an appointment as soon as possible for a visit in 1 week(s).   Specialty: Nurse Practitioner Contact information: [place] [address] [phone]         [facility] Follow up.  Why: THURSDAY 08/27/2021 AND MONDAY 08/31/2021.   will need new prescription for lovenox if INR is less than 2 on monday.        Truitt Merle, MD. Schedule an appointment as soon as possible for a visit in 1 month(s).   Specialties: Hematology, Oncology Why: discuss oral anticoagulation options again. Contact information: [place] [address] [phone]                Disposition: Home Diet recommendation: Dysphagia type 1 thin liquid  Discharge Exam: Vitals:   08/25/21 0606 08/25/21 0606 08/25/21 0819 08/25/21 1424  BP: (!) 117/59 (!) 117/59 119/73 112/67  Pulse: 84 84 100 89  Resp: '18 18 18 18  '$ Temp: 98.5 F (36.9 C) 98.5 F (36.9 C) 98.4 F (36.9 C) 98.4 F (36.9 C)  TempSrc: Axillary  Oral   SpO2: 99% 99% 100% 100%  Weight:   64.9 kg   Height:       General: Appear in no distress; no visible Abnormal Neck Mass Or lumps, Conjunctiva  normal Cardiovascular: S1 and S2 Present, no Murmur, Respiratory: good respiratory effort, Bilateral Air entry present and CTA, no Crackles, no wheezes Abdomen: Bowel Sound present, Non tender  Extremities: Right BKA, no pedal edema Neurology: alert and oriented to time, place, and person  Gait not checked due to patient safety concerns Filed Weights   08/23/21 0813 08/24/21 0924 08/25/21 0819  Weight: 78 kg 61.7 kg 64.9 kg   Condition at discharge: stable  The results of significant diagnostics from this hospitalization (including imaging, microbiology, ancillary and laboratory) are listed below for reference.   Imaging Studies: CT ABDOMEN PELVIS WO CONTRAST  Result Date: 08/22/2021 CLINICAL DATA:  Retroperitoneal hemorrhage EXAM: CT ABDOMEN AND PELVIS WITHOUT CONTRAST TECHNIQUE: Multidetector CT imaging of the abdomen and pelvis was performed following the standard protocol without IV contrast. RADIATION DOSE REDUCTION: This exam was performed according to the departmental dose-optimization program which includes automated exposure control, adjustment of the mA and/or kV according to patient size and/or use of iterative reconstruction technique. COMPARISON:  None Available.  Retroperitoneal bleed FINDINGS: Lower chest: 08/12/2021 Hepatobiliary: Bibasilar atelectasis. Enlargement of cardiac silhouette. Small amount of pericardial fluid. Pancreas: Cholelithiasis. Liver unremarkable. No biliary dilatation. Spleen: Normal appearance Adrenals/Urinary Tract: Normal appearance Stomach/Bowel: Adrenal glands, kidneys, and ureters normal appearance. No urinary tract calcification or dilatation. Questionable small intraluminal mass at base of urinary bladder, 14 mm diameter sagittal image 53. Vascular/Lymphatic: Gastrostomy tube with mild gastric distension. Normal appendix. Bowel loops unremarkable. Reproductive: Scattered pelvic phleboliths. IVC filter with penetration of legs through IVC wall. Minimal  atherosclerotic calcification aorta without aneurysm. No adenopathy. Other: Single small focus of free intraperitoneal air adjacent to the gastrostomy tube likely related to recent tube placement. No free fluid. No retroperitoneal hemorrhage. Small supraumbilical ventral hernia containing fat. Musculoskeletal: Osseous demineralization. IMPRESSION: Single small focus of free intraperitoneal air adjacent to the gastrostomy tube likely related to recent tube placement. IVC filter with penetration of legs through IVC wall. Questionable small intraluminal mass at base of urinary bladder, recommend correlation with cystoscopy. Cholelithiasis. Small supraumbilical ventral hernia containing fat. No evidence of retroperitoneal hemorrhage. Aortic Atherosclerosis (ICD10-I70.0). Electronically Signed   By: Lavonia Dana M.D.   On: 08/22/2021 10:30   DG CHEST PORT 1 VIEW  Result Date: 08/20/2021 CLINICAL DATA:  Shortness of breath. EXAM: PORTABLE CHEST 1 VIEW COMPARISON:  08/11/2021 FINDINGS: The heart size and mediastinal contours are within normal limits. Both lungs are  clear. Stimulator generator again seen overlying the left hemithorax. The visualized skeletal structures are unremarkable. IMPRESSION: No active disease. Electronically Signed   By: Marlaine Hind M.D.   On: 08/20/2021 18:34   IR GASTROSTOMY TUBE MOD SED  Result Date: 08/19/2021 CLINICAL DATA:  Post CVA, needs enteral feeding support EXAM: PERC PLACEMENT GASTROSTOMY FLUOROSCOPY: Radiation Exposure Index (as provided by the fluoroscopic device): 5 mGy air Kerma TECHNIQUE: The procedure, risks, benefits, and alternatives were explained to the patient and family. Questions regarding the procedure were encouraged and answered; consent for the procedure was obtained. As antibiotic prophylaxis, cefazolin 2 g was ordered pre-procedure and administered intravenously within one hour of incision. Appropriate percutaneous access path was confirmed on recent CT  abdomen. A 5 French angiographic catheter was placed as orogastric tube. The upper abdomen was prepped with Betadine, draped in usual sterile fashion, and infiltrated locally with 1% lidocaine. Intravenous Fentanyl 22mg and Versed '2mg'$  were administered as conscious sedation during continuous monitoring of the patient's level of consciousness and physiological / cardiorespiratory status by the radiology RN, with a total moderate sedation time of 10 minutes. 0.5 mg glucagon given IV.Stomach was insufflated using air through the orogastric tube. An 172French sheath needle was advanced percutaneously into the gastric lumen under fluoroscopy. Gas could be aspirated and a small contrast injection confirmed intraluminal spread. The sheath was exchanged over a guidewire for a 9 [place] sheath, through which the snare device was advanced and used to snare a guidewire passed through the orogastric tube. This was withdrawn, and the snare attached to the 20 French pull-through gastrostomy tube, which was advanced antegrade, positioned with the internal bumper securing the anterior gastric wall to the anterior abdominal wall. Small contrast injection confirms appropriate positioning. The external bumper was applied and the catheter was flushed. COMPLICATIONS: COMPLICATIONS none IMPRESSION: 1. Technically successful 20 French pull-through gastrostomy placement under fluoroscopy. Electronically Signed   By: DLucrezia EuropeM.D.   On: 08/19/2021 10:33   CT RENAL STONE STUDY  Result Date: 08/12/2021 CLINICAL DATA:  Flank pain.  Concern for kidney stone. EXAM: CT ABDOMEN AND PELVIS WITHOUT CONTRAST TECHNIQUE: Multidetector CT imaging of the abdomen and pelvis was performed following the standard protocol without IV contrast. RADIATION DOSE REDUCTION: This exam was performed according to the departmental dose-optimization program which includes automated exposure control, adjustment of the mA and/or kV according to patient  size and/or use of iterative reconstruction technique. COMPARISON:  CT abdomen pelvis dated 08/11/2021. FINDINGS: Evaluation of this exam is limited in the absence of intravenous contrast. Lower chest: Minimal bibasilar linear atelectasis. The visualized lung bases are otherwise clear. No intra-abdominal free air or free fluid. Hepatobiliary: The liver is unremarkable. Multiple gallstones. There is vicarious excretion of contrast within the gallbladder. No pericholecystic fluid or evidence of acute cholecystitis by CT. Pancreas: Unremarkable. No pancreatic ductal dilatation or surrounding inflammatory changes. Spleen: Normal in size without focal abnormality. Adrenals/Urinary Tract: The adrenal glands unremarkable. The kidneys, visualized ureters, and urinary bladder appear unremarkable. A Foley catheter with balloon in the lumen of the bladder. Air within the bladder introduced via the catheter. Excreted contrast from recent IV administration noted within the bladder. Stomach/Bowel: There is moderate stool throughout the colon. There is no bowel obstruction or active inflammation. The appendix is normal. Vascular/Lymphatic: The abdominal aorta is unremarkable. An infrarenal IVC filter is noted. No portal venous gas. There is no adenopathy. Reproductive: The uterus is retroverted and grossly unremarkable. A tampon is seen in  the vagina. No adnexal masses. Other: Small fat containing supraumbilical hernia. Musculoskeletal: Osteopenia.  No acute osseous pathology. IMPRESSION: 1. No acute intra-abdominal or pelvic pathology. No hydronephrosis or nephrolithiasis. 2. Cholelithiasis. 3. Moderate colonic stool burden. No bowel obstruction. Normal appendix. Electronically Signed   By: Anner Crete M.D.   On: 08/12/2021 20:20   EEG adult  Result Date: 08/12/2021 Lora Havens, MD     08/12/2021  1:57 PM Patient Name: Cassandra Andrade MRN: [id number] Epilepsy Attending: Lora Havens Referring Physician/Provider:  Norval Morton, MD Date: 08/12/2021 Duration: 22.46 mins Patient history:  56 year old female with a history of hemorrhagic stroke and seizures who presents with altered mental status. EEG to evaluate for seizure. Level of alertness: Awake AEDs during EEG study: BRV, LTG, LCM Technical aspects: This EEG study was done with scalp electrodes positioned according to the 10-20 International system of electrode placement. Electrical activity was acquired at a sampling rate of '500Hz'$  and reviewed with a high frequency filter of '70Hz'$  and a low frequency filter of '1Hz'$ . EEG data were recorded continuously and digitally stored. Description: No posterior dominant rhythm was seen. EEG showed continuous generalized generalized 3-'6hz'$  theta-delta slowing.  There is also sharply contoured 3 to 5 Hz theta-delta slowing in right frontal region, maximal F8 consistent with breach artifact.  Hyperventilation and photic stimulation were not performed.    ABNORMALITY - Continuous slow, generalized - Breach artifact, right frontal region  IMPRESSION: This EEG is suggestive of cortical dysfunction in right frontal consistent with prior craniotomy.  Additionally there is moderate diffuse encephalopathy, nonspecific to etiology.  No seizures were seen throughout the recording. Lora Havens   US Pelvis Complete  Result Date: 08/11/2021 CLINICAL DATA:  Postmenopausal bleeding. EXAM: TRANSABDOMINAL ULTRASOUND OF PELVIS TECHNIQUE: Transabdominal ultrasound examination of the pelvis was performed including evaluation of the uterus, ovaries, adnexal regions, and pelvic cul-de-sac. COMPARISON:  CT AP 08/11/21. FINDINGS: Uterus Sub optimal evaluation. Ultrasound technologist reports patient exhibited altered mental status and was unable to be appropriately position for diagnostic study. Endometrium Thickness: Only a small portion of the endometrium was visualized which measured 7 mm in thickness. Right ovary Not visualized. Left ovary Not  visualized. Other findings:  No abnormal free fluid. IMPRESSION: 1. This study is essentially nondiagnostic. Sonographer reports patient exhibits altered mental status and was unable to be appropriately position for a diagnostic study. 2. A small portion of the endometrium was visualized measuring 7 mm.) This is considered abnormal in a postmenopausal female. When the patient's clinical condition improves and is able to be positioned appropriately for a diagnostic ultrasound of the pelvis, repeat examination is recommended. Electronically Signed   By: Kerby Moors M.D.   On: 08/11/2021 14:38   CT Head Wo Contrast  Result Date: 08/11/2021 CLINICAL DATA:  Mental status changes of unknown cause in a 56 year old female. EXAM: CT HEAD WITHOUT CONTRAST TECHNIQUE: Contiguous axial images were obtained from the base of the skull through the vertex without intravenous contrast. RADIATION DOSE REDUCTION: This exam was performed according to the departmental dose-optimization program which includes automated exposure control, adjustment of the mA and/or kV according to patient size and/or use of iterative reconstruction technique. COMPARISON:  Multiple prior studies most recent of Jul 19, 2021. FINDINGS: Brain: No evidence of acute infarction, hemorrhage, hydrocephalus, extra-axial collection or mass lesion/mass effect. 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 Vascular: No hyperdense vessel or unexpected calcification. Skull: Signs of RIGHT frontotemporal craniotomy with  similar appearance. No acute findings. Sinuses/Orbits: Unremarkable to the extent evaluated. Other: None. IMPRESSION: 1. No acute intracranial abnormality. 2. 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 Electronically Signed   By: Zetta Bills M.D.   On: 08/11/2021 10:53   CT ABDOMEN PELVIS W CONTRAST  Result Date: 08/11/2021 CLINICAL  DATA:  Sepsis EXAM: CT ABDOMEN AND PELVIS WITH CONTRAST TECHNIQUE: Multidetector CT imaging of the abdomen and pelvis was performed using the standard protocol following bolus administration of intravenous contrast. RADIATION DOSE REDUCTION: This exam was performed according to the departmental dose-optimization program which includes automated exposure control, adjustment of the mA and/or kV according to patient size and/or use of iterative reconstruction technique. CONTRAST:  136m OMNIPAQUE IOHEXOL 300 MG/ML  SOLN COMPARISON:  06/10/2021 FINDINGS: Streak artifact from patient's arms. Lower chest: No acute abnormality. Hepatobiliary: No focal liver abnormality. Several small gallstones are again identified. No biliary dilatation. Pancreas: Unremarkable. Spleen: Unremarkable. Adrenals/Urinary Tract: Adrenals are unremarkable. Kidneys and bladder are unremarkable. Stomach/Bowel: Stomach is within normal limits. Bowel is normal in caliber. Vascular/Lymphatic: IVC filter is present.  No adenopathy. Reproductive: Uterus and bilateral adnexa are unremarkable. Other: No free fluid.  No acute abnormality of the abdominal wall. Musculoskeletal: No acute osseous abnormality. IMPRESSION: No acute process. Cholelithiasis. Electronically Signed   By: PMacy MisM.D.   On: 08/11/2021 10:48   DG Chest Port 1 View  Result Date: 08/11/2021 CLINICAL DATA:  Possible sepsis EXAM: PORTABLE CHEST 1 VIEW COMPARISON:  07/14/2021 FINDINGS: Patient is rotated. Persistent mild elevation the right hemidiaphragm. No new consolidation or edema. No pleural effusion. Stable cardiomediastinal contours. Stimulator generator overlies the left hilar region with lead extending superiorly. IMPRESSION: No acute process in the chest. Electronically Signed   By: PMacy MisM.D.   On: 08/11/2021 09:19    Microbiology: Results for orders placed or performed during the hospital encounter of 08/11/21  Blood Culture (routine x 2)     Status:  None   Collection Time: 08/11/21  8:46 AM   Specimen: BLOOD LEFT WRIST  Result Value Ref Range Status   Specimen Description BLOOD LEFT WRIST  Final   Special Requests   Final    BOTTLES DRAWN AEROBIC AND ANAEROBIC Blood Culture adequate volume   Culture   Final    NO GROWTH 5 DAYS Performed at [facility] [id number], [place] [address]   Report Status 08/16/2021 FINAL  Final  Urine Culture     Status: Abnormal   Collection Time: 08/11/21  8:46 AM   Specimen: In/Out Cath Urine  Result Value Ref Range Status   Specimen Description IN/OUT CATH URINE  Final   Special Requests   Final    NONE Performed at [facility] [id number], [place] [id number]   Culture (A)  Final    6,000 COLONIES/mL METHICILLIN RESISTANT STAPHYLOCOCCUS AUREUS   Report Status 08/13/2021 FINAL  Final   Organism ID, Bacteria METHICILLIN RESISTANT STAPHYLOCOCCUS AUREUS (A)  Final      Susceptibility   Methicillin resistant staphylococcus aureus - MIC*    CIPROFLOXACIN >=8 RESISTANT Resistant     GENTAMICIN <=0.5 SENSITIVE Sensitive     NITROFURANTOIN <=16 SENSITIVE Sensitive     OXACILLIN >=4 RESISTANT Resistant     TETRACYCLINE <=1 SENSITIVE Sensitive     VANCOMYCIN <=0.5 SENSITIVE Sensitive     TRIMETH/SULFA >=320 RESISTANT Resistant     CLINDAMYCIN <=0.25  SENSITIVE Sensitive     RIFAMPIN <=0.5 SENSITIVE Sensitive     Inducible Clindamycin NEGATIVE Sensitive     * 6,000 COLONIES/mL METHICILLIN RESISTANT STAPHYLOCOCCUS AUREUS  Resp Panel by RT-PCR (Flu A&B, Covid) Anterior Nasal Swab     Status: None   Collection Time: 08/11/21  8:52 AM   Specimen: Anterior Nasal Swab  Result Value Ref Range Status   SARS Coronavirus 2 by RT PCR NEGATIVE NEGATIVE Final    Comment: (NOTE) SARS-CoV-2 target nucleic acids are NOT DETECTED.  The SARS-CoV-2 RNA is generally detectable in upper respiratory specimens during the acute phase of infection. The lowest concentration of  SARS-CoV-2 viral copies this assay can detect is 138 copies/mL. A negative result does not preclude SARS-Cov-2 infection and should not be used as the sole basis for treatment or other patient management decisions. A negative result may occur with  improper specimen collection/handling, submission of specimen other than nasopharyngeal swab, presence of viral mutation(s) within the areas targeted by this assay, and inadequate number of viral copies(<138 copies/mL). A negative result must be combined with clinical observations, patient history, and epidemiological information. The expected result is Negative.  Fact Sheet for Patients:  EntrepreneurPulse.com.au  Fact Sheet for Healthcare Providers:  IncredibleEmployment.be  This test is no t yet approved or cleared by the [place] FDA and  has been authorized for detection and/or diagnosis of SARS-CoV-2 by FDA under an Emergency Use Authorization (EUA). This EUA will remain  in effect (meaning this test can be used) for the duration of the COVID-19 declaration under Section 564(b)(1) of the Act, 21 U.S.C.section 360bbb-3(b)(1), unless the authorization is terminated  or revoked sooner.       Influenza A by PCR NEGATIVE NEGATIVE Final   Influenza B by PCR NEGATIVE NEGATIVE Final    Comment: (NOTE) The Xpert Xpress SARS-CoV-2/FLU/RSV plus assay is intended as an aid in the diagnosis of influenza from Nasopharyngeal swab specimens and should not be used as a sole basis for treatment. Nasal washings and aspirates are unacceptable for Xpert Xpress SARS-CoV-2/FLU/RSV testing.  Fact Sheet for Patients: EntrepreneurPulse.com.au  Fact Sheet for Healthcare Providers: IncredibleEmployment.be  This test is not yet approved or cleared by the [place] FDA and has been authorized for detection and/or diagnosis of SARS-CoV-2 by FDA under an Emergency Use  Authorization (EUA). This EUA will remain in effect (meaning this test can be used) for the duration of the COVID-19 declaration under Section 564(b)(1) of the Act, 21 U.S.C. section 360bbb-3(b)(1), unless the authorization is terminated or revoked.  Performed at [facility], [place] [address], [place], [place] [address]   Blood Culture (routine x 2)     Status: None   Collection Time: 08/11/21 10:07 AM   Specimen: BLOOD  Result Value Ref Range Status   Specimen Description BLOOD SITE NOT SPECIFIED  Final   Special Requests   Final    BOTTLES DRAWN AEROBIC AND ANAEROBIC Blood Culture adequate volume   Culture   Final    NO GROWTH 5 DAYS Performed at [facility], [address], [place], [place] [address]    Report Status 08/16/2021 FINAL  Final  Culture, blood (Routine X 2) w Reflex to ID Panel     Status: None   Collection Time: 08/20/21  7:30 PM   Specimen: BLOOD  Result Value Ref Range Status   Specimen Description BLOOD BLOOD LEFT HAND  Final   Special Requests   Final  BOTTLES DRAWN AEROBIC AND ANAEROBIC Blood Culture adequate volume   Culture   Final    NO GROWTH 5 DAYS Performed at [facility], [place] [address] [place], [place] [address]    Report Status 08/25/2021 FINAL  Final  Culture, blood (Routine X 2) w Reflex to ID Panel     Status: None   Collection Time: 08/20/21  7:30 PM   Specimen: BLOOD  Result Value Ref Range Status   Specimen Description BLOOD BLOOD LEFT ARM  Final   Special Requests   Final    BOTTLES DRAWN AEROBIC AND ANAEROBIC Blood Culture adequate volume   Culture   Final    NO GROWTH 5 DAYS Performed at [facility], [place] [address], [place], [place] [address]    Report Status 08/25/2021 FINAL  Final    Labs: CBC: Recent Labs  Lab 08/23/21 0441 08/24/21 0259 08/25/21 0252  WBC 7.1 7.5 8.7  HGB 8.7* 8.4* 8.5*  HCT 28.2* 27.4* 26.8*  MCV 87.3 87.5 87.3  PLT 434* 467* 502*   Basic Metabolic Panel: Recent Labs   Lab 08/23/21 0441 08/24/21 0259 08/25/21 0252  NA 138 135 134*  K 4.6 4.6 4.6  CL 100 100 94*  CO2 '30 30 28  '$ GLUCOSE 140* 151* 163*  BUN '10 14 16  '$ CREATININE 0.52 0.58 0.57  CALCIUM 8.4* 8.6* 8.9  MG 1.6* 2.1 1.7  PHOS  --  3.5  --    Liver Function Tests: Recent Labs  Lab 08/23/21 0441  AST 29  ALT 15  ALKPHOS 87  BILITOT 0.4  PROT 5.7*  ALBUMIN 2.0*   CBG: Recent Labs  Lab 08/24/21 1212 08/24/21 1759 08/25/21 0035 08/25/21 0553 08/25/21 1239  GLUCAP 170* 156* 180* 166* 135*    Discharge time spent: greater than 30 minutes.  Signed: Berle Mull, MD [facility] 08/25/2021

## 2021-09-03 ENCOUNTER — Ambulatory Visit (INDEPENDENT_AMBULATORY_CARE_PROVIDER_SITE_OTHER): Payer: Medicare Other | Admitting: Neurology

## 2021-09-03 ENCOUNTER — Encounter: Payer: Self-pay | Admitting: Neurology

## 2021-09-03 DIAGNOSIS — G40919 Epilepsy, unspecified, intractable, without status epilepticus: Secondary | ICD-10-CM

## 2021-09-03 NOTE — Progress Notes (Addendum)
I connected with  Manie Pallone on 09/03/21 by a telephone and verified that I am speaking with the correct person using two identifiers.   I discussed the limitations of evaluation and management by phone visit. The patient expressed understanding and agreed to proceed.   Location  Patient: Home  Provider: Office at [facility] was recently admitted to the hospital for supratherapeutic INR and GI bleed.  During this time there were no seizures noted.  She was having dysphagia requiring a PEG tube.  Back in April I made some change to her medication, I discontinued the levetiracetam and started her on Briviact.  Since then mother reported that the her behavior have improved and also her seizures have improved.  Currently she is on with Briviact 100 mg twice daily, Vimpat 200 mg twice daily and lamotrigine 350 mg twice daily.  She does also have a VNS.  She reported 1 seizure last week that she described described as mild seizure.   She reports she was aware and was able to tell mother that she was having a seizure.  She continues on the current medications, denies any side effects.  Mother reports that she is back to her normal self, she can do everything except she is bedridden at the moment.    Objective: Alert, responsive, able to communicate, answer questions appropriately    Plan  Continue with Briviact 100 mg twice daily Continue with lamotrigine 250 mg twice daily Continue with lacosamide 200 mg twice daily continue Continue with Klonopin as needed Contact me if she has a breakthrough seizure Follow-up in 29-month    AAlric Ran MD  I have spent a total of 15 minutes dedicated to this patient today, preparing to see patient, performing a medically appropriate examination and evaluation, ordering tests and/or medications and procedures, and counseling and educating the patient/family/caregiver; independently interpreting result and communicating results to the  family/patient/caregiver; and documenting clinical information in the electronic medical record.

## 2021-09-04 ENCOUNTER — Other Ambulatory Visit (HOSPITAL_COMMUNITY): Payer: Self-pay | Admitting: Psychiatry

## 2021-09-04 ENCOUNTER — Other Ambulatory Visit: Payer: Self-pay | Admitting: Neurology

## 2021-09-08 ENCOUNTER — Other Ambulatory Visit: Payer: Self-pay

## 2021-09-08 MED ORDER — LAMOTRIGINE 200 MG PO TABS: 200.0000 mg | ORAL_TABLET | Freq: Two times a day (BID) | ORAL | 3 refills | Status: DC

## 2021-09-08 NOTE — Progress Notes (Signed)
Pharmacy is requesting a refill of Lamotrigine 200 mg tablets. As per last office visit note, "Continue with lamotrigine 250 mg twice daily."

## 2021-09-18 ENCOUNTER — Other Ambulatory Visit (HOSPITAL_COMMUNITY): Payer: Self-pay | Admitting: Psychiatry

## 2021-09-29 ENCOUNTER — Other Ambulatory Visit: Payer: Self-pay | Admitting: Neurology

## 2021-09-29 ENCOUNTER — Other Ambulatory Visit (HOSPITAL_COMMUNITY): Payer: Self-pay | Admitting: Psychiatry

## 2021-10-01 ENCOUNTER — Other Ambulatory Visit: Payer: Self-pay | Admitting: Neurology

## 2021-10-01 ENCOUNTER — Other Ambulatory Visit (HOSPITAL_COMMUNITY): Payer: Self-pay | Admitting: Psychiatry

## 2021-10-01 DIAGNOSIS — F321 Major depressive disorder, single episode, moderate: Secondary | ICD-10-CM

## 2021-10-01 DIAGNOSIS — F419 Anxiety disorder, unspecified: Secondary | ICD-10-CM

## 2021-10-05 ENCOUNTER — Other Ambulatory Visit: Payer: Self-pay | Admitting: Neurology

## 2021-10-05 ENCOUNTER — Telehealth (HOSPITAL_COMMUNITY): Payer: Self-pay | Admitting: *Deleted

## 2021-10-05 ENCOUNTER — Telehealth: Payer: Self-pay | Admitting: Neurology

## 2021-10-05 MED ORDER — BRIVIACT 100 MG PO TABS: 100.0000 mg | ORAL_TABLET | Freq: Two times a day (BID) | ORAL | 6 refills | Status: DC

## 2021-10-05 MED ORDER — CLONAZEPAM 1 MG PO TABS: ORAL_TABLET | ORAL | 5 refills | Status: DC

## 2021-10-05 MED ORDER — OLANZAPINE 5 MG PO TBDP: 5.0000 mg | ORAL_TABLET | Freq: Two times a day (BID) | ORAL | 0 refills | Status: DC

## 2021-10-05 NOTE — Addendum Note (Signed)
Addended by: Cristela Felt E on: 10/05/2021 02:12 PM   Modules accepted: Orders

## 2021-10-05 NOTE — Telephone Encounter (Signed)
Pt's mother, Ronita Hipps, left a VM requesting a script for Zyprexa as was prescribed during pt most recent hospitalization and at d/c, per d/c note, by Neuro. Per your last note on 07/07/21 pt was not taking and you did not refill. Per EMR Ms. Landry Mellow also called Neurology for refill. Pt next appointment with you is scheduled for 01/06/22. Do not see any upcoming appointment with Neuro currently. Please review and advise.

## 2021-10-05 NOTE — Telephone Encounter (Signed)
Pt's mother would like to know if Dr April Manson would manage pt's OLANZapine zydis (ZYPREXA) 5 MG disintegrating tablet & BRIVIACT 100 MG TABS tablet .  Both medications prescribed thru [facility].

## 2021-10-05 NOTE — Telephone Encounter (Signed)
There was a separate call requesting refills. Per Epic, Dr. April Manson authorized all three medication today. [facility] I called her mother (on DPR) and provided this update.

## 2021-10-05 NOTE — Telephone Encounter (Signed)
Verify Drug Registry For Clonazepam 1 Mg Tablet Last Filled: 09/20/2021 Quantity: 30 tablets for 15 days Last appointment: 09/03/2021 Next appointment: N/A  Verify Drug Registry For Briviact 100 Mg Tablet Last Filled: 09/04/2021 Quantity: 60 tablets for 30 days

## 2021-10-05 NOTE — Telephone Encounter (Signed)
Pt mother is requesting [name]) refills on clonazePAM (KLONOPIN) 1 MG tablet and BRIVIACT 100 MG TABS tablet. OLANZapine zydis (ZYPREXA) 5 MG disintegrating tablet. [name] is [id number] [place]

## 2021-10-05 NOTE — Telephone Encounter (Signed)
Olanzapine was d/c. If Neuro started than she needs to get refill from them.

## 2021-10-15 ENCOUNTER — Other Ambulatory Visit: Payer: Self-pay | Admitting: Neurology

## 2021-10-15 ENCOUNTER — Other Ambulatory Visit: Payer: Self-pay | Admitting: Adult Health

## 2021-10-18 ENCOUNTER — Other Ambulatory Visit (HOSPITAL_COMMUNITY): Payer: Self-pay | Admitting: Psychiatry

## 2021-10-18 DIAGNOSIS — F321 Major depressive disorder, single episode, moderate: Secondary | ICD-10-CM

## 2021-10-18 DIAGNOSIS — F419 Anxiety disorder, unspecified: Secondary | ICD-10-CM

## 2021-10-24 ENCOUNTER — Other Ambulatory Visit: Payer: Self-pay | Admitting: Neurology

## 2021-11-04 ENCOUNTER — Telehealth: Payer: Self-pay | Admitting: Neurology

## 2021-11-04 NOTE — Telephone Encounter (Signed)
Pt's mother would like a call to discuss possibly putting pt on Melatonin ,to ensure she sleeps thru the night please call.

## 2021-11-04 NOTE — Telephone Encounter (Addendum)
Verbally spoke with Dr. April Manson ok try melatonin. He does want to see the pt I called pt's mom relayed ok to try melatonin and the dosages available. We have schedule virtual visit for 11/10/2021 at 345 with Dr. April Manson. Mom sts pt is bed bound and could not transport to the office without the assistance of EMS

## 2021-11-06 ENCOUNTER — Other Ambulatory Visit (HOSPITAL_COMMUNITY): Payer: Self-pay | Admitting: Psychiatry

## 2021-11-06 ENCOUNTER — Other Ambulatory Visit: Payer: Self-pay | Admitting: Neurology

## 2021-11-06 DIAGNOSIS — G40919 Epilepsy, unspecified, intractable, without status epilepticus: Secondary | ICD-10-CM

## 2021-11-06 DIAGNOSIS — F419 Anxiety disorder, unspecified: Secondary | ICD-10-CM

## 2021-11-06 DIAGNOSIS — F321 Major depressive disorder, single episode, moderate: Secondary | ICD-10-CM

## 2021-11-10 ENCOUNTER — Telehealth: Payer: Medicare Other | Admitting: Neurology

## 2021-11-12 ENCOUNTER — Telehealth: Payer: Self-pay | Admitting: Neurology

## 2021-11-12 ENCOUNTER — Other Ambulatory Visit: Payer: Self-pay

## 2021-11-12 MED ORDER — OLANZAPINE 5 MG PO TBDP: 5.0000 mg | ORAL_TABLET | Freq: Two times a day (BID) | ORAL | 0 refills | Status: DC

## 2021-11-12 NOTE — Progress Notes (Signed)
Rx refilled.

## 2021-11-12 NOTE — Telephone Encounter (Signed)
Patient's rx has been sent to the requested pharmacy.

## 2021-11-12 NOTE — Telephone Encounter (Signed)
Pt is requesting a refill for  OLANZapine zydis (ZYPREXA) 5 MG disintegrating tablet.  [id number]

## 2021-11-18 ENCOUNTER — Other Ambulatory Visit: Payer: Self-pay | Admitting: Neurology

## 2021-11-25 ENCOUNTER — Other Ambulatory Visit (HOSPITAL_COMMUNITY): Payer: Self-pay | Admitting: Psychiatry

## 2021-11-25 DIAGNOSIS — F321 Major depressive disorder, single episode, moderate: Secondary | ICD-10-CM

## 2021-11-25 DIAGNOSIS — F419 Anxiety disorder, unspecified: Secondary | ICD-10-CM

## 2021-12-02 ENCOUNTER — Other Ambulatory Visit: Payer: Self-pay | Admitting: Neurology

## 2021-12-04 ENCOUNTER — Other Ambulatory Visit: Payer: Self-pay | Admitting: Neurology

## 2021-12-04 DIAGNOSIS — G40919 Epilepsy, unspecified, intractable, without status epilepticus: Secondary | ICD-10-CM

## 2021-12-08 ENCOUNTER — Telehealth (INDEPENDENT_AMBULATORY_CARE_PROVIDER_SITE_OTHER): Payer: Medicare Other | Admitting: Neurology

## 2021-12-08 ENCOUNTER — Telehealth: Payer: Self-pay | Admitting: Neurology

## 2021-12-08 ENCOUNTER — Encounter: Payer: Self-pay | Admitting: Neurology

## 2021-12-08 ENCOUNTER — Telehealth: Payer: Medicare Other | Admitting: Neurology

## 2021-12-08 DIAGNOSIS — G40919 Epilepsy, unspecified, intractable, without status epilepticus: Secondary | ICD-10-CM

## 2021-12-08 DIAGNOSIS — G40019 Localization-related (focal) (partial) idiopathic epilepsy and epileptic syndromes with seizures of localized onset, intractable, without status epilepticus: Secondary | ICD-10-CM

## 2021-12-08 MED ORDER — CENOBAMATE 14 X 12.5 MG & 14 X 25 MG PO TBPK: ORAL_TABLET | ORAL | 0 refills | Status: DC

## 2021-12-08 MED ORDER — LACOSAMIDE 200 MG PO TABS: 200.0000 mg | ORAL_TABLET | Freq: Two times a day (BID) | ORAL | 5 refills | Status: DC

## 2021-12-08 NOTE — Addendum Note (Signed)
Addended byAlric Ran on: 12/08/2021 11:12 AM   Modules accepted: Orders

## 2021-12-08 NOTE — Telephone Encounter (Signed)
..   Pt understands that although there may be some limitations with this type of visit, we will take all precautions to reduce any security or privacy concerns.  Pt understands that this will be treated like an in office visit and we will file with pt's insurance, and there may be a patient responsible charge related to this service. ? ?

## 2021-12-08 NOTE — Telephone Encounter (Signed)
Plan will be to continue Vimpat. [place] drug registry has been verified. Last refill 07/09/2021 # 180 90 day. New rx will forward to MD.

## 2021-12-08 NOTE — Progress Notes (Signed)
   [facility]  PATIENT: Cassandra Andrade DOB: 01/05/1966  HISTORY FROM: Patient and mother REASON FOR VISIT: Seizure follow up   HISTORICAL  CHIEF COMPLAINT:  Chief Complaint  Patient presents with   Seizures    Follow up    INTERVAL HISTORY:  [name] was called today via video visit. Her mother was present. Last video visit was in July.  At that time, we had switch her Keppra to Briviact.  Mother has reported irritability, anger, violence has resolved. She still continues to have seizures, on average once a week.  She is compliant with the medications, so far no additional side effects.  Reports some depression but she is manage with Dr. Crist Infante EXAM Awake, alert, responding appropriately, bilateral upper extremity at least antigravity.  She has residual right facial droop.   ASSESSMENT AND PLAN  56 y.o. year old female here with intractable epilepsy    1. Intractable epilepsy without status epilepticus, unspecified epilepsy type ([place])     PLAN: Continue with Briviact 100 mg twice daily Continue with lamotrigine 250 mg twice daily Continue with lacosamide 200 mg twice daily continue Continue with Klonopin as needed Will add Cenobamate Contact me if she has a breakthrough seizure Follow-up in 6 weeks   Virtual Visit via Video Note  I connected with [name] on 12/08/21 at  9:45 AM EDT by a video enabled telemedicine application and verified that I am speaking with the correct person using two identifiers.  Location: Patient: Home  Provider: [place] office   I discussed the limitations of evaluation and management by telemedicine and the availability of in person appointments. The patient expressed understanding and agreed to proceed.   I discussed the assessment and treatment plan with the patient. The patient was provided an opportunity to ask questions and all were answered. The patient agreed with the plan and demonstrated an understanding  of the instructions.   The patient was advised to call back or seek an in-person evaluation if the symptoms worsen or if the condition fails to improve as anticipated.  I provided 30 minutes of non-face-to-face time during this encounter.   No orders of the defined types were placed in this encounter.   Meds ordered this encounter  Medications   Cenobamate 14 x 12.5 MG & 14 x 25 MG TBPK    Sig: Take 12.5 mg by mouth daily for 14 days, THEN 25 mg daily for 14 days.    Dispense:  28 each    Refill:  0    Return in about 6 weeks (around 01/19/2022).   Alric Ran, MD 12/08/2021, 9:56 AM  [facility] [address], [place], [place] [address] [phone]

## 2021-12-08 NOTE — Addendum Note (Signed)
Addended by: Verlin Grills on: 12/08/2021 11:07 AM   Modules accepted: Orders

## 2021-12-08 NOTE — Telephone Encounter (Signed)
Pt's mother states  [id number] Informed her they are waiting lacosamide (VIMPAT) 200 MG TABS tablet on a response from Dr CApril Mansonre: the refilling of this medication, please reply.

## 2021-12-08 NOTE — Patient Instructions (Signed)
Continue with Briviact 100 mg twice daily Continue with lamotrigine 250 mg twice daily Continue with lacosamide 200 mg twice daily continue Continue with Klonopin as needed Will add Cenobamate Contact me if she has a breakthrough seizure Follow-up in 6 weeks

## 2021-12-08 NOTE — Telephone Encounter (Signed)
Will address at [facility] video visit today.

## 2021-12-14 ENCOUNTER — Telehealth: Payer: Self-pay | Admitting: Neurology

## 2021-12-14 ENCOUNTER — Telehealth: Payer: Self-pay

## 2021-12-14 NOTE — Telephone Encounter (Signed)
It is Dr. Adele Schilder who started it, I gave her a refill because she needed it.   Thank you

## 2021-12-14 NOTE — Telephone Encounter (Signed)
[facility] does not have refills. They are unsure why the patient was only given 30 tablets at a time. It may be an insurance requirement that the patient can only get 30 tablets at a time. It looks like our office ordered olanzapine last but prior to that, Dr. Adele Schilder was prescribing it.

## 2021-12-14 NOTE — Telephone Encounter (Signed)
Raye Sorrow PA submitted via CMM Key: [facility] Your information has been sent to [facility]. Awaiting determination

## 2021-12-14 NOTE — Telephone Encounter (Signed)
Pt mother is calling. Stated the pharmacy is only giving her 30 pills of OLANZapine zydis (ZYPREXA) 5 MG disintegrating tablet. Said Pt is supposed to be taking 2 pills a day and she is not getting [id number] She is requesting a nurse call the pharmacy. Said Pt is out of medication because of this problem. She is requesting a call back from the nurse.

## 2021-12-15 NOTE — Telephone Encounter (Signed)
I called patient's mother, [name], per DPR, to discuss. No answer, no VM available. Will try again later.

## 2021-12-15 NOTE — Telephone Encounter (Signed)
I called patient, spoke with patient's mother, [name], per [facility].  I advised her to contact Dr. Marguerite Olea office for refills and questions related to the olanzapine.  Patient's mother verbalized understanding.

## 2021-12-16 ENCOUNTER — Telehealth: Payer: Self-pay | Admitting: Neurology

## 2021-12-16 NOTE — Telephone Encounter (Signed)
Contacted pt mother back, informed her Dr April Manson gave 6 months supply of Briviact in Aug. She should have plent y of refills for that. Again advised to call Dr Adele Schilder office back again for refill of Zyprexa, number provided that was available on Rx.  She verbally understood and was appreciative

## 2021-12-16 NOTE — Telephone Encounter (Signed)
Pt's mother called stating that she is not able to get a hold of Arfeen, Arlyce Harman, MD and the pt is completely out of her OLANZapine zydis (ZYPREXA) 5 MG disintegrating tablet and her BRIVIACT 100 MG TABS tablet. Mother on DPR would like to be advised on what can be done for the pt to receive her medications.

## 2021-12-17 ENCOUNTER — Other Ambulatory Visit: Payer: Self-pay | Admitting: Neurology

## 2021-12-22 ENCOUNTER — Telehealth (HOSPITAL_COMMUNITY): Payer: Self-pay | Admitting: *Deleted

## 2021-12-22 ENCOUNTER — Other Ambulatory Visit: Payer: Self-pay | Admitting: Neurology

## 2021-12-22 MED ORDER — OLANZAPINE 5 MG PO TBDP: 5.0000 mg | ORAL_TABLET | Freq: Two times a day (BID) | ORAL | 0 refills | Status: DC | PRN

## 2021-12-22 NOTE — Telephone Encounter (Signed)
Writer spoke with mother/guardian, Ronita Hipps, who has requested a refill of Zyprexa Zydis 5 mg BID last written on 11/12/21 by Dr. April Manson, Neurology. Mother states that she called [facility] requesting a refill and that she was directed back to our office. This is reflected in telephone encounter. Medication last written by you on 06/04/21. Medication was d/c while pt was hospitalized in April 2023. Mother states that the Zyprexa helps a great deal when pt is agitated and combative. Mother is primary caretaker for pt. Sharmain has a scheduled appointment upcoming on 01/06/22. Please review and advise.

## 2021-12-22 NOTE — Telephone Encounter (Signed)
Pt's mother/guardian, Ronita Hipps LVM requesting refills of Zyprexa Zydis 5 mg and Briviact. Both of these medications are currently prescribed by Neurologist Dr. April Manson. Dr. Adele Schilder has not prescribed the Zydis since 4.13.23. writer has informed pt mother of this previously and she verbalized understanding. Writer returned call however there was no answer and no VM. Pt has a f/u scheduled with Dr. Adele Schilder upcoming on 01/06/22.

## 2021-12-22 NOTE — Telephone Encounter (Signed)
Patient had a good response with olanzapine.  She can take 5 mg Zydis twice a day as needed until her next appointment.  I will send the prescription.

## 2021-12-30 NOTE — Telephone Encounter (Signed)
Contacted [employer] at [id number] to initiate appeal, spoke to Manuel Garcia. Appeal has been stated, I have faxed OV notes to [phone]. Case ID [id number]. 7 day turn around.

## 2021-12-30 NOTE — Telephone Encounter (Signed)
Please appeal the decision, she has focal onset epilepsy. I will update her diagnosis. Thanks

## 2021-12-30 NOTE — Telephone Encounter (Signed)
Medicare allows [place] to cover a drug only when it is a Part D drug. A Part D drug is one that is used for a "medically accepted indication." A medically accepted indication means the use is approved by the Food and Drug Administration (FDA) OR the use is supported by one of the following accepted references: (1) [place]. (2) Micromedex [place]. XCOPRI PAK 12.5-25 is not FDA approved or supported by an accepted reference for your medical condition(s): Epilepsy, unspecified, intractable, without status epilepticus. In order to approve your medication, you must have partial-onset seizures. Therefore, your drug is denied because it is not being used for a "medically accepted indication."

## 2021-12-30 NOTE — Telephone Encounter (Signed)
PA got denied, see reason below. Would you like me to try to appeal?

## 2022-01-03 ENCOUNTER — Other Ambulatory Visit (HOSPITAL_COMMUNITY): Payer: Self-pay | Admitting: Psychiatry

## 2022-01-03 ENCOUNTER — Other Ambulatory Visit: Payer: Self-pay | Admitting: Neurology

## 2022-01-05 ENCOUNTER — Other Ambulatory Visit: Payer: Self-pay

## 2022-01-05 MED ORDER — LAMOTRIGINE 25 MG PO TABS: ORAL_TABLET | ORAL | 5 refills | Status: DC

## 2022-01-06 ENCOUNTER — Encounter (HOSPITAL_COMMUNITY): Payer: Self-pay | Admitting: Psychiatry

## 2022-01-06 ENCOUNTER — Telehealth (HOSPITAL_BASED_OUTPATIENT_CLINIC_OR_DEPARTMENT_OTHER): Payer: Medicare Other | Admitting: Psychiatry

## 2022-01-06 DIAGNOSIS — F321 Major depressive disorder, single episode, moderate: Secondary | ICD-10-CM | POA: Diagnosis not present

## 2022-01-06 DIAGNOSIS — F419 Anxiety disorder, unspecified: Secondary | ICD-10-CM

## 2022-01-06 MED ORDER — VENLAFAXINE HCL ER 150 MG PO CP24: 150.0000 mg | ORAL_CAPSULE | Freq: Every day | ORAL | 0 refills | Status: DC

## 2022-01-06 MED ORDER — OLANZAPINE 5 MG PO TBDP: 5.0000 mg | ORAL_TABLET | Freq: Two times a day (BID) | ORAL | 0 refills | Status: DC | PRN

## 2022-01-06 NOTE — Progress Notes (Signed)
Virtual Visit via Telephone Note  I connected with Terica Shell on 01/06/22 at  2:40 PM EST by telephone and verified that I am speaking with the correct person using two identifiers.  Location: Patient: Home Provider: Home Office   I discussed the limitations, risks, security and privacy concerns of performing an evaluation and management service by telephone and the availability of in person appointments. I also discussed with the patient that there may be a patient responsible charge related to this service. The patient expressed understanding and agreed to proceed.   History of Present Illness: Patient is evaluated by phone session.  Most of the information was obtained from her mother.  Patient has been in and out from the hospital since the last time I saw her in May.  She was having seizures and found to be in coherent, confused.[name]  Her last inpatient was few months ago.  Today she is more coherent but is still rambling and thought process circumstantial.  However she knows that she is talking to the psychiatrist and takes medicine to help her depression and anxiety.  Her mother reported still having seizures but they are not as strong and frequent.  She is sleeping better.  Patient denies any crying spells or any feeling of hopelessness but admitted to feeling sad and dysphoria because her son does not come to see her.  Patient is now on feeding tube because she had lost a lot of weight and since feeding tube her weight is gradually getting better.  There is no scale so she could not have accurate weight.  She is seeing neurology.  Patient reported no concerns from the medication.  Mother reported that patient is not agitated, angry, violence and usually follow the directions.  There were no recent crying or any suicidal thoughts.  Patient has 4 sons and usually elder son comes and visit.  Patient admitted sometime dysphoria and not able to see her kids as frequent.  Patient denies any feeling of  hopelessness or any suicidal thoughts.  Past Psychiatric History:  H/O inpatient in 2011 due to overdose on pain medication. No h/o mania or psychosis.  Tried Zoloft and Remeron but do not remember.  Took Lexapro and Abilify but stopped due to lack of response.  Prozac worked for a long time until it stopped working .  Psychiatric Specialty Exam: Physical Exam  Review of Systems  Constitutional:  Positive for fatigue.       On feeding tube.    There were no vitals taken for this visit.There is no height or weight on file to calculate BMI.  General Appearance: NA  Eye Contact:  NA  Speech:  Slow and sometimes rambling  Volume:  Decreased  Mood:  Anxious  Affect:  NA  Thought Process:  Descriptions of Associations: Intact  Orientation:  Full (Time, Place, and Person)  Thought Content:  Rumination  Suicidal Thoughts:  No  Homicidal Thoughts:  No  Memory:  Immediate;   Fair Recent;   Fair Remote;   Fair  Judgement:  Fair  Insight:  Shallow  Psychomotor Activity:  NA  Concentration:  Concentration: Fair and Attention Span: Fair  Recall:  [employer] of Knowledge:  Fair  Language:  Fair  Akathisia:  No  Handed:  Right  AIMS (if indicated):     Assets:  Communication Skills Desire for Improvement Housing Social Support  ADL's:  Impaired  Cognition:  Impaired,  Mild  Sleep:   fair  Assessment and Plan: Major depressive disorder, recurrent.  Anxiety.  Mood disorder due to general medical condition.  I review records from recent hospitalization, review medication, blood work results.  She is on a new medication for seizures that had helped her a lot.  She is seeing Dr. April Manson for seizures.  Patient's mother noticed since taking the olanzapine 5 mg 2 times a day, patient agitation anger is so much better.  She is more coherent and not violent.  I recommend to continue olanzapine 5 mg Zydis twice a day and Effexor 150 mg daily.  Her primary care is Dr. Eldridge Abrahams and I  encourage should collaborate with her to get a visiting nurse for medication arrangement, getting weight.  Patient cannot do her ADLs by herself.  She has home health aide for limited hours.  Her mother helps her a lot.  We will follow-up in 3 months.  Follow Up Instructions:    I discussed the assessment and treatment plan with the patient. The patient was provided an opportunity to ask questions and all were answered. The patient agreed with the plan and demonstrated an understanding of the instructions.   The patient was advised to call back or seek an in-person evaluation if the symptoms worsen or if the condition fails to improve as anticipated.  Collaboration of Care: Other provider involved in patient's care AEB notes are available in epic to review.  Patient/Guardian was advised Release of Information must be obtained prior to any record release in order to collaborate their care with an outside provider. Patient/Guardian was advised if they have not already done so to contact the registration department to sign all necessary forms in order for [place] to release information regarding their care.   Consent: Patient/Guardian gives verbal consent for treatment and assignment of benefits for services provided during this visit. Patient/Guardian expressed understanding and agreed to proceed.    I provided 24 minutes of non-face-to-face time during this encounter.   Kathlee Nations, MD

## 2022-01-07 NOTE — Telephone Encounter (Signed)
Contacted [employer] for a update on decision. Approval received 10/23/202023-02/12/2023 Auth #[id number]

## 2022-01-08 ENCOUNTER — Telehealth: Payer: Self-pay | Admitting: Neurology

## 2022-01-08 ENCOUNTER — Other Ambulatory Visit (HOSPITAL_COMMUNITY): Payer: Self-pay | Admitting: Psychiatry

## 2022-01-08 ENCOUNTER — Other Ambulatory Visit: Payer: Self-pay | Admitting: Neurology

## 2022-01-08 DIAGNOSIS — F321 Major depressive disorder, single episode, moderate: Secondary | ICD-10-CM

## 2022-01-08 NOTE — Telephone Encounter (Signed)
Pt is requesting a refill for BRIVIACT 100 MG TABS tablet .  [id number]

## 2022-01-11 NOTE — Telephone Encounter (Signed)
Per [place] Drug registry the last prescription reflects refills remain on file for this drug.  I have sent notice to pharmacy asking for refill to be processed.

## 2022-01-12 ENCOUNTER — Other Ambulatory Visit (HOSPITAL_COMMUNITY): Payer: Self-pay | Admitting: Psychiatry

## 2022-01-12 DIAGNOSIS — F419 Anxiety disorder, unspecified: Secondary | ICD-10-CM

## 2022-01-12 DIAGNOSIS — F321 Major depressive disorder, single episode, moderate: Secondary | ICD-10-CM

## 2022-01-19 ENCOUNTER — Telehealth: Payer: Medicare Other | Admitting: Neurology

## 2022-01-29 ENCOUNTER — Other Ambulatory Visit: Payer: Self-pay | Admitting: Neurology

## 2022-02-02 ENCOUNTER — Other Ambulatory Visit: Payer: Self-pay

## 2022-02-06 ENCOUNTER — Other Ambulatory Visit: Payer: Self-pay | Admitting: Neurology

## 2022-02-06 ENCOUNTER — Other Ambulatory Visit (HOSPITAL_COMMUNITY): Payer: Self-pay | Admitting: Psychiatry

## 2022-02-06 DIAGNOSIS — F321 Major depressive disorder, single episode, moderate: Secondary | ICD-10-CM

## 2022-02-06 DIAGNOSIS — F419 Anxiety disorder, unspecified: Secondary | ICD-10-CM

## 2022-02-17 ENCOUNTER — Other Ambulatory Visit (HOSPITAL_COMMUNITY): Payer: Self-pay | Admitting: Psychiatry

## 2022-02-17 ENCOUNTER — Other Ambulatory Visit: Payer: Self-pay | Admitting: Neurology

## 2022-02-17 DIAGNOSIS — F321 Major depressive disorder, single episode, moderate: Secondary | ICD-10-CM

## 2022-02-17 DIAGNOSIS — F419 Anxiety disorder, unspecified: Secondary | ICD-10-CM

## 2022-02-21 ENCOUNTER — Other Ambulatory Visit (HOSPITAL_COMMUNITY): Payer: Self-pay | Admitting: Psychiatry

## 2022-02-21 ENCOUNTER — Other Ambulatory Visit: Payer: Self-pay | Admitting: Neurology

## 2022-02-21 DIAGNOSIS — F419 Anxiety disorder, unspecified: Secondary | ICD-10-CM

## 2022-02-21 DIAGNOSIS — F321 Major depressive disorder, single episode, moderate: Secondary | ICD-10-CM

## 2022-02-27 ENCOUNTER — Other Ambulatory Visit: Payer: Self-pay | Admitting: Neurology

## 2022-03-01 ENCOUNTER — Telehealth: Payer: Self-pay | Admitting: Neurology

## 2022-03-01 DIAGNOSIS — F321 Major depressive disorder, single episode, moderate: Secondary | ICD-10-CM

## 2022-03-01 NOTE — Telephone Encounter (Signed)
Pt mother is calling. Stated she needs for Dr. April Manson to refill medication OLANZapine zydis (ZYPREXA) 5 MG disintegrating tablet. [id number]

## 2022-03-01 NOTE — Telephone Encounter (Signed)
Per Last telephone note in chart, Dr. Adele Schilder was needing to be contacting pertaining to the prescription. Please inform her if she calls back, will also send mychart message

## 2022-03-06 ENCOUNTER — Other Ambulatory Visit (HOSPITAL_COMMUNITY): Payer: Self-pay | Admitting: Psychiatry

## 2022-03-06 DIAGNOSIS — F321 Major depressive disorder, single episode, moderate: Secondary | ICD-10-CM

## 2022-03-09 ENCOUNTER — Telehealth (HOSPITAL_COMMUNITY): Payer: Self-pay

## 2022-03-09 NOTE — Telephone Encounter (Signed)
Attempted to contact patient to schedule Modified Barium Swallow - could not leave voicemail.

## 2022-03-10 ENCOUNTER — Other Ambulatory Visit (HOSPITAL_COMMUNITY): Payer: Self-pay | Admitting: *Deleted

## 2022-03-10 ENCOUNTER — Telehealth: Payer: Self-pay | Admitting: Neurology

## 2022-03-10 DIAGNOSIS — F321 Major depressive disorder, single episode, moderate: Secondary | ICD-10-CM

## 2022-03-10 MED ORDER — OLANZAPINE 5 MG PO TBDP: 5.0000 mg | ORAL_TABLET | Freq: Two times a day (BID) | ORAL | 0 refills | Status: DC | PRN

## 2022-03-10 NOTE — Telephone Encounter (Signed)
Pt is requesting a refill for OLANZapine zydis (ZYPREXA) 5 MG disintegrating tablet .  [id number] changes to insurance with New Year

## 2022-03-10 NOTE — Telephone Encounter (Signed)
Called pt mother. Informed her of message from [place]. Stated she will reach out to Dr. Adele Schilder office.

## 2022-03-10 NOTE — Telephone Encounter (Signed)
Kristen Loader, RN     03/01/22  1:18 PM Note Per Last telephone note in chart, Dr. Adele Schilder was needing to be contacting pertaining to the prescription. Please inform her if she calls back, will also send mychart message    I reviewed chart we are not managing this med, please have mom reach out to Dr. Adele Schilder.  thanks

## 2022-03-16 ENCOUNTER — Telehealth (HOSPITAL_COMMUNITY): Payer: Self-pay

## 2022-03-16 NOTE — Telephone Encounter (Signed)
2nd attempt to contact patient to schedule Modified Barium Swallow -could not leave voicemail.

## 2022-03-23 ENCOUNTER — Other Ambulatory Visit (HOSPITAL_COMMUNITY): Payer: Self-pay

## 2022-03-23 DIAGNOSIS — R1310 Dysphagia, unspecified: Secondary | ICD-10-CM

## 2022-03-30 ENCOUNTER — Ambulatory Visit (HOSPITAL_COMMUNITY)
Admission: RE | Admit: 2022-03-30 | Discharge: 2022-03-30 | Disposition: A | Discharge status: Home / Self Care | Payer: 59 | Source: Ambulatory Visit | Attending: Nurse Practitioner | Admitting: Nurse Practitioner

## 2022-03-30 DIAGNOSIS — R532 Functional quadriplegia: Secondary | ICD-10-CM | POA: Insufficient documentation

## 2022-03-30 DIAGNOSIS — R1310 Dysphagia, unspecified: Secondary | ICD-10-CM | POA: Insufficient documentation

## 2022-03-30 DIAGNOSIS — Z85830 Personal history of malignant neoplasm of bone: Secondary | ICD-10-CM | POA: Diagnosis not present

## 2022-03-30 DIAGNOSIS — Z86718 Personal history of other venous thrombosis and embolism: Secondary | ICD-10-CM | POA: Diagnosis not present

## 2022-03-30 DIAGNOSIS — Z7901 Long term (current) use of anticoagulants: Secondary | ICD-10-CM | POA: Diagnosis not present

## 2022-03-30 NOTE — Progress Notes (Signed)
Modified Barium Swallow Study  Patient Details  Name: Cassandra Andrade MRN: [id number] Date of Birth: January 10, 1966  Today's Date: 03/30/2022  Modified Barium Swallow completed.  Full report located under Chart Review in the Imaging Section.  History of Present Illness Pt is a 57 yo female referred for OP MBS by ENT to assess for potential to resume PO diet and remove PEG. Pt has been seen by SLP in the past, most recently recommended to be on Dys 1 (puree) diet and thin liquids in June 2023. PEG was placed 6/28 due to inadequate nutrition. 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   Clinical Impression Pt has a generally functional oropharyngeal swallow. Mild oral differences include slow, prolonged mastication in the setting of no dentition. There is consistently trace lingual residue present. Swallow is initiated at the pyriform sinuses with thin liquids, otherwise at the valleculae with all other consistencies tested. Suspected cervical osteophytes are present that result in partial distention of the pharyngoesophageal segment, but pharyngeal clearance is intact. No aspiration occurs. The barium tablet was transiently retained in the esophagus, but cleared with time and additional boluses. Recommend that pt begin a PO diet of up to regular solids and thin liquids, dependent on what she can adequately masticate.  Factors that may increase risk of adverse event in presence of aspiration ([employer] 2021): Reduced cognitive function;Limited mobility;Presence of tubes (ETT, trach, NG, etc.)  Swallow Evaluation Recommendations Recommendations: PO diet PO Diet Recommendation: Regular;Thin liquids (Level 0) (solids up to regular, dependent on what she is able to  adequately masticate) Liquid Administration via: Cup;Straw Medication Administration: Whole meds with liquid Supervision: Intermittent supervision/cueing for swallowing strategies;Patient able to self-feed Swallowing strategies  : Minimize environmental distractions;Slow rate;Small bites/sips;Follow solids with liquids Postural changes: Position pt fully upright for meals;Stay upright 30-60 min after meals Oral care recommendations: Oral care BID (2x/day)       Osie Bond., M.A. [place] Office [phone]  Secure chat preferred  03/30/2022,3:13 PM

## 2022-04-06 ENCOUNTER — Other Ambulatory Visit (HOSPITAL_COMMUNITY): Payer: Self-pay | Admitting: Psychiatry

## 2022-04-06 ENCOUNTER — Other Ambulatory Visit: Payer: Self-pay | Admitting: Neurology

## 2022-04-06 DIAGNOSIS — F321 Major depressive disorder, single episode, moderate: Secondary | ICD-10-CM

## 2022-04-06 NOTE — Telephone Encounter (Signed)
  Patient is due, per [place] drug registry

## 2022-04-07 ENCOUNTER — Telehealth (HOSPITAL_BASED_OUTPATIENT_CLINIC_OR_DEPARTMENT_OTHER): Payer: 59 | Admitting: Psychiatry

## 2022-04-07 ENCOUNTER — Encounter (HOSPITAL_COMMUNITY): Payer: Self-pay | Admitting: Psychiatry

## 2022-04-07 VITALS — Wt 175.0 lb

## 2022-04-07 DIAGNOSIS — F419 Anxiety disorder, unspecified: Secondary | ICD-10-CM | POA: Diagnosis not present

## 2022-04-07 DIAGNOSIS — F321 Major depressive disorder, single episode, moderate: Secondary | ICD-10-CM

## 2022-04-07 DIAGNOSIS — F39 Unspecified mood [affective] disorder: Secondary | ICD-10-CM

## 2022-04-07 MED ORDER — VENLAFAXINE HCL ER 150 MG PO CP24: 150.0000 mg | ORAL_CAPSULE | Freq: Every day | ORAL | 0 refills | Status: DC

## 2022-04-07 MED ORDER — OLANZAPINE 5 MG PO TBDP: 5.0000 mg | ORAL_TABLET | Freq: Two times a day (BID) | ORAL | 2 refills | Status: DC | PRN

## 2022-04-07 NOTE — Progress Notes (Signed)
[facility] MD Virtual Progress Note   Patient Location: Home Provider Location: Home Office  I connect with patient by telephone and verified that I am speaking with correct person by using two identifiers. I discussed the limitations of evaluation and management by telemedicine and the availability of in person appointments. I also discussed with the patient that there may be a patient responsible charge related to this service. The patient expressed understanding and agreed to proceed.  Cassandra Andrade [id number] 57 y.o.  04/07/2022 2:43 PM    History of Present Illness:  Patient is evaluated by phone session.  Most of the information was obtained from her father as patient tends to get confused and incoherent.  Mother reported patient is not agitated, angry, violent since taking the olanzapine 5 mg 2 times a day on a regular basis.  She still have seizures but had a better control and seeing neurology on a regular basis.  Patient told sometimes she get upset and irritable because she liked to eat food by mouth and wants to have a feeding tube.  Patient saw ENT and after evaluation they did allow her to have a soft food.  Mother told that patient has gained weight from the past and that is better because before she was looking like a skeleton.  She had a blood work in November and her AST and ALT slightly high.  They are 57 and 72.  Glucose 64.  BUN 29 and creatinine 0.85.  Mother denies any self talking but does reported patient misses her kids who does not come as often as patient like to.  I also spoke to the patient who admitted sadness when she does not see her son.  However she denies any suicidal thoughts, hallucination, paranoia or any feeling of hopelessness or worthlessness.  Patient's ADLs are impaired.  She has a feeding tube.  She does not know the name of the medication but admitted they are helping her mood and anxiety.  Patient has health for her basic care needs and  ADLs.   Past Psychiatric History:    Outpatient Encounter Medications as of 04/07/2022  Medication Sig   acetaminophen (TYLENOL) 650 MG CR tablet Take 650 mg by mouth every 8 (eight) hours as needed for pain.   Ascorbic Acid (VITAMIN C) 1000 MG tablet Take 1,000 mg by mouth daily.   BRIVIACT 100 MG TABS tablet TAKE 1 TABLET BY MOUTH TWICE A DAY   clonazePAM (KLONOPIN) 1 MG tablet One tablet twice daily as needed for seizure.   enoxaparin (LOVENOX) 60 MG/0.6ML injection Inject 0.6 mLs (60 mg total) into the skin every 12 (twelve) hours for 5 days.   ketoconazole (NIZORAL) 2 % shampoo Apply 1 application topically 2 (two) times a week. Monday,wednesday   lacosamide (VIMPAT) 200 MG TABS tablet Take 1 tablet (200 mg total) by mouth 2 (two) times daily.   lamoTRIgine (LAMICTAL) 100 MG tablet TAKE 3 TABLETS (300 MG TOTAL) BY MOUTH 2 TIMES DAILY ALONG WITH 50 MG 2 TIMES DAILY TOTAL OF 350 MG TWICE DAILY (300 MG+ 50 MG)   lamoTRIgine (LAMICTAL) 200 MG tablet Take 1 tablet (200 mg total) by mouth 2 (two) times daily.   lamoTRIgine (LAMICTAL) 25 MG tablet Take 2 tablets (50 mg total) by mouth 2 (two) times daily   levothyroxine (SYNTHROID) 75 MCG tablet Place 1 tablet (75 mcg total) into feeding tube daily at 6 (six) AM.   metoprolol tartrate (LOPRESSOR) 25 MG tablet Take 0.5 tablets (  12.5 mg total) by mouth 2 (two) times daily.   Nutritional Supplements (FEEDING SUPPLEMENT, OSMOLITE 1.5 CAL,) LIQD Place 237 mLs into feeding tube 6 (six) times daily.   OLANZapine zydis (ZYPREXA) 5 MG disintegrating tablet Take 1 tablet (5 mg total) by mouth 2 (two) times daily as needed.   omeprazole (PRILOSEC) 20 MG capsule Take 20 mg by mouth at bedtime.   promethazine (PHENERGAN) 25 MG tablet Take 25 mg by mouth every 8 (eight) hours as needed for nausea or vomiting.   traMADol (ULTRAM) 50 MG tablet Take 1 tablet (50 mg total) by mouth every 12 (twelve) hours as needed for moderate pain or severe pain.    venlafaxine XR (EFFEXOR-XR) 150 MG 24 hr capsule Take 1 capsule (150 mg total) by mouth daily with breakfast.   warfarin (COUMADIN) 10 MG tablet Take 1 tablet (10 mg total) by mouth daily at 4 PM.   Water For Irrigation, Sterile (FREE WATER) SOLN Place 120 mLs into feeding tube 6 (six) times daily.   No facility-administered encounter medications on file as of 04/07/2022.    No results found for this or any previous visit (from the past 2160 hour(s)).   Psychiatric Specialty Exam: Physical Exam  Review of Systems  Constitutional:        Feeding tube    There were no vitals taken for this visit.There is no height or weight on file to calculate BMI.  General Appearance: NA  Eye Contact:  NA  Speech:  Slow and incoherent   Volume:  Decreased  Mood:  Anxious  Affect:  NA  Thought Process:  Descriptions of Associations: Intact  Orientation:  Full (Time, Place, and Person)  Thought Content:  Rumination  Suicidal Thoughts:  No  Homicidal Thoughts:  No  Memory:  Immediate;   Fair Recent;   Fair Remote;   Fair  Judgement:  Fair  Insight:  Shallow  Psychomotor Activity:  NA  Concentration:  Concentration: Fair and Attention Span: Fair  Recall:  [employer] of Knowledge:  Fair  Language:  Fair  Akathisia:  No  Handed:  Right  AIMS (if indicated):     Assets:  Desire for Improvement Housing  ADL's:  Impaired  Cognition:  Impaired,  Mild  Sleep:        Assessment/Plan: Major depressive disorder, single episode, moderate (HCC)  Anxiety  Mood disorder ([place])  I reviewed current medication.  Patient is stable with olanzapine 5 mg Zydis 2 times a day and Effexor 150 mg daily.  There has now agitation, anger, irritability.  Mother reported to continue the same medication since it is working.  She has a home health aide.  Mother also helps her a lot.  Discussed medication side effects and benefits.  Continue Zydis 5 mg 2 times a day and Effexor 150 mg daily.  Recommended to call us  back if for any question or any concern.  Follow-up in 3 months.  Follow Up Instructions:     I discussed the assessment and treatment plan with the patient. The patient was provided an opportunity to ask questions and all were answered. The patient agreed with the plan and demonstrated an understanding of the instructions.   The patient was advised to call back or seek an in-person evaluation if the symptoms worsen or if the condition fails to improve as anticipated.    Collaboration of Care: Other provider involved in patient's care AEB notes are available in epic to review.  Patient/Guardian  was advised Release of Information must be obtained prior to any record release in order to collaborate their care with an outside provider. Patient/Guardian was advised if they have not already done so to contact the registration department to sign all necessary forms in order for [place] to release information regarding their care.   Consent: Patient/Guardian gives verbal consent for treatment and assignment of benefits for services provided during this visit. Patient/Guardian expressed understanding and agreed to proceed.     I provided 30 minutes of non face to face time during this encounter.  Kathlee Nations, MD 04/07/2022

## 2022-04-10 ENCOUNTER — Other Ambulatory Visit: Payer: Self-pay | Admitting: Neurology

## 2022-04-10 ENCOUNTER — Other Ambulatory Visit (HOSPITAL_COMMUNITY): Payer: Self-pay | Admitting: Psychiatry

## 2022-04-10 DIAGNOSIS — F321 Major depressive disorder, single episode, moderate: Secondary | ICD-10-CM

## 2022-04-12 ENCOUNTER — Other Ambulatory Visit: Payer: Self-pay

## 2022-04-30 ENCOUNTER — Other Ambulatory Visit: Payer: Self-pay | Admitting: Neurology

## 2022-04-30 ENCOUNTER — Other Ambulatory Visit (HOSPITAL_COMMUNITY): Payer: Self-pay | Admitting: Psychiatry

## 2022-04-30 DIAGNOSIS — F321 Major depressive disorder, single episode, moderate: Secondary | ICD-10-CM

## 2022-04-30 DIAGNOSIS — F419 Anxiety disorder, unspecified: Secondary | ICD-10-CM

## 2022-05-03 ENCOUNTER — Other Ambulatory Visit: Payer: Self-pay | Admitting: Neurology

## 2022-05-05 ENCOUNTER — Other Ambulatory Visit: Payer: Self-pay | Admitting: Neurology

## 2022-05-05 ENCOUNTER — Other Ambulatory Visit (HOSPITAL_COMMUNITY): Payer: Self-pay | Admitting: Psychiatry

## 2022-05-05 DIAGNOSIS — F419 Anxiety disorder, unspecified: Secondary | ICD-10-CM

## 2022-05-05 DIAGNOSIS — F321 Major depressive disorder, single episode, moderate: Secondary | ICD-10-CM

## 2022-05-07 ENCOUNTER — Other Ambulatory Visit: Payer: Self-pay | Admitting: Neurology

## 2022-05-12 ENCOUNTER — Other Ambulatory Visit: Payer: Self-pay

## 2022-05-13 ENCOUNTER — Ambulatory Visit: Payer: 59 | Admitting: Neurology

## 2022-06-05 ENCOUNTER — Other Ambulatory Visit (HOSPITAL_COMMUNITY): Payer: Self-pay | Admitting: Psychiatry

## 2022-06-05 DIAGNOSIS — F321 Major depressive disorder, single episode, moderate: Secondary | ICD-10-CM

## 2022-06-05 DIAGNOSIS — F419 Anxiety disorder, unspecified: Secondary | ICD-10-CM

## 2022-06-13 ENCOUNTER — Other Ambulatory Visit: Payer: Self-pay | Admitting: Neurology

## 2022-06-13 ENCOUNTER — Other Ambulatory Visit (HOSPITAL_COMMUNITY): Payer: Self-pay | Admitting: Psychiatry

## 2022-06-13 DIAGNOSIS — G40919 Epilepsy, unspecified, intractable, without status epilepticus: Secondary | ICD-10-CM

## 2022-06-13 DIAGNOSIS — F321 Major depressive disorder, single episode, moderate: Secondary | ICD-10-CM

## 2022-06-14 NOTE — Telephone Encounter (Signed)
Requested Prescriptions   Pending Prescriptions Disp Refills   lacosamide (VIMPAT) 200 MG TABS tablet [Pharmacy Med Name: LACOSAMIDE 200 MG TABLET] 180 tablet     Sig: TAKE 1 TABLET BY MOUTH TWICE A DAY   lamoTRIgine (LAMICTAL) 25 MG tablet [Pharmacy Med Name: LAMOTRIGINE 25 MG TABLET] 120 tablet 5    Sig: TAKE 2 TABLETS BY MOUTH 2 TIMES DAILY.   Last visit 12/08/21 with Dr. Teresa Coombs, next visit with him 08/16/22, routing to provider to refill Dispenses   Dispensed Days Supply Quantity Provider Pharmacy  LACOSAMIDE 200 MG TABLET 03/09/2022 90 180 each Windell Norfolk, MD [facility] [phone] - G...  LACOSAMIDE 200 MG TABLET 12/08/2021 90 180 each Windell Norfolk, MD [facility] [phone] - [facility]...  LACOSAMIDE 200 MG TABLET 07/09/2021 90 180 each Windell Norfolk, MD [facility] [phone] - [facility] KitchenMarland Kitchen

## 2022-06-19 ENCOUNTER — Other Ambulatory Visit (HOSPITAL_COMMUNITY): Payer: Self-pay | Admitting: Psychiatry

## 2022-06-19 DIAGNOSIS — F419 Anxiety disorder, unspecified: Secondary | ICD-10-CM

## 2022-06-19 DIAGNOSIS — F321 Major depressive disorder, single episode, moderate: Secondary | ICD-10-CM

## 2022-07-07 ENCOUNTER — Telehealth (HOSPITAL_BASED_OUTPATIENT_CLINIC_OR_DEPARTMENT_OTHER): Payer: 59 | Admitting: Psychiatry

## 2022-07-07 ENCOUNTER — Encounter (HOSPITAL_COMMUNITY): Payer: Self-pay | Admitting: Psychiatry

## 2022-07-07 VITALS — Wt 175.0 lb

## 2022-07-07 DIAGNOSIS — R251 Tremor, unspecified: Secondary | ICD-10-CM | POA: Diagnosis not present

## 2022-07-07 DIAGNOSIS — F321 Major depressive disorder, single episode, moderate: Secondary | ICD-10-CM | POA: Diagnosis not present

## 2022-07-07 DIAGNOSIS — F419 Anxiety disorder, unspecified: Secondary | ICD-10-CM | POA: Diagnosis not present

## 2022-07-07 MED ORDER — HYDROXYZINE HCL 10 MG PO TABS
10.0000 mg | ORAL_TABLET | Freq: Two times a day (BID) | ORAL | 2 refills | Status: DC
Start: 2022-07-07 — End: 2022-10-20

## 2022-07-07 MED ORDER — OLANZAPINE 5 MG PO TBDP
5.0000 mg | ORAL_TABLET | Freq: Two times a day (BID) | ORAL | 2 refills | Status: DC | PRN
Start: 2022-07-07 — End: 2022-10-20

## 2022-07-07 MED ORDER — VENLAFAXINE HCL ER 150 MG PO CP24
150.0000 mg | ORAL_CAPSULE | Freq: Every day | ORAL | 0 refills | Status: AC
Start: 2022-07-07 — End: ?

## 2022-07-07 NOTE — Progress Notes (Signed)
[facility] MD Virtual Progress Note   Patient Location: Home Provider Location: Home Office  I connect with patient by telephone and verified that I am speaking with correct person by using two identifiers. I discussed the limitations of evaluation and management by telemedicine and the availability of in person appointments. I also discussed with the patient that there may be a patient responsible charge related to this service. The patient expressed understanding and agreed to proceed.  Cassandra Andrade [id number] 57 y.o.  07/07/2022 3:29 PM  History of Present Illness:  Patient is evaluated by phone session.  I also spoke to her mother who was present there.  Patient is a poor historian and most of the information was obtained from the mother.  Patient reported having shakes, jitteriness and tremors.  She denies any suicidal thoughts or hallucination.  She takes the medicine but continued to have seizures and seeing neurology.  She is not able to provide the details about the medication.  Her mother reported that she is taking olanzapine and really she gave her Seroquel.  She also takes moderate amount of Lamictal.  Her mother reported that lately she is not aggressive, violent, self-abusive behavior, confused or delusional.  Mother reported that she has been eating better and she had gained a few pounds.  In the past she was very aggressive and hitting herself but lately she has not done that.  She also takes Klonopin when she has episodes of severe agitation.  Mother reported there are times when she has difficulty holding.  As per mother she has seizure earlier this morning.  She is seeing neurology on a regular basis.  Patient requires help for her ADLs.  Her memory is not good and she tends to forget things.  Patient and her mother not able to do video session.  She is on a feeding tube and medicines are given through the feeding tube.  She takes olanzapine twice a day and Effexor  150 mg in the morning.  Past Psychiatric History: H/O inpatient in 2011 due to overdose on pain medication. No h/o mania or psychosis.  Tried Zoloft and Remeron but do not remember.  Took Lexapro and Abilify but stopped due to lack of response.  Prozac worked for a long time until it stopped working .    Outpatient Encounter Medications as of 07/07/2022  Medication Sig   acetaminophen (TYLENOL) 650 MG CR tablet Take 650 mg by mouth every 8 (eight) hours as needed for pain.   Ascorbic Acid (VITAMIN C) 1000 MG tablet Take 1,000 mg by mouth daily.   BRIVIACT 100 MG TABS tablet TAKE 1 TABLET BY MOUTH TWICE A DAY   clonazePAM (KLONOPIN) 1 MG tablet TAKE 1 TABLET BY MOUTH UP TO TWICE DAILY AS NEEDED FOR SEIZURES   enoxaparin (LOVENOX) 60 MG/0.6ML injection Inject 0.6 mLs (60 mg total) into the skin every 12 (twelve) hours for 5 days.   ketoconazole (NIZORAL) 2 % shampoo Apply 1 application topically 2 (two) times a week. Monday,wednesday   lacosamide (VIMPAT) 200 MG TABS tablet TAKE 1 TABLET BY MOUTH TWICE A DAY   lamoTRIgine (LAMICTAL) 100 MG tablet TAKE 1 TABLET BY MOUTH TWICE A DAY   lamoTRIgine (LAMICTAL) 200 MG tablet Take 1 tablet (200 mg total) by mouth 2 (two) times daily.   lamoTRIgine (LAMICTAL) 25 MG tablet TAKE 2 TABLETS BY MOUTH 2 TIMES DAILY.   levothyroxine (SYNTHROID) 75 MCG tablet Place 1 tablet (75 mcg total) into feeding tube  daily at 6 (six) AM.   metoprolol tartrate (LOPRESSOR) 25 MG tablet Take 0.5 tablets (12.5 mg total) by mouth 2 (two) times daily.   Nutritional Supplements (FEEDING SUPPLEMENT, OSMOLITE 1.5 CAL,) LIQD Place 237 mLs into feeding tube 6 (six) times daily.   OLANZapine zydis (ZYPREXA) 5 MG disintegrating tablet Take 1 tablet (5 mg total) by mouth 2 (two) times daily as needed.   omeprazole (PRILOSEC) 20 MG capsule Take 20 mg by mouth at bedtime.   promethazine (PHENERGAN) 25 MG tablet Take 25 mg by mouth every 8 (eight) hours as needed for nausea or vomiting.    QUEtiapine (SEROQUEL) 25 MG tablet TAKE 1 TABLET BY MOUTH TWICE A DAY   traMADol (ULTRAM) 50 MG tablet Take 1 tablet (50 mg total) by mouth every 12 (twelve) hours as needed for moderate pain or severe pain.   venlafaxine XR (EFFEXOR-XR) 150 MG 24 hr capsule Take 1 capsule (150 mg total) by mouth daily with breakfast.   warfarin (COUMADIN) 10 MG tablet Take 1 tablet (10 mg total) by mouth daily at 4 PM.   Water For Irrigation, Sterile (FREE WATER) SOLN Place 120 mLs into feeding tube 6 (six) times daily.   No facility-administered encounter medications on file as of 07/07/2022.    No results found for this or any previous visit (from the past 2160 hour(s)).   Psychiatric Specialty Exam: Physical Exam  Review of Systems  Constitutional:        Patient has a feeding tube  Psychiatric/Behavioral:  Positive for confusion and decreased concentration. The patient is nervous/anxious.     Weight 175 lb (79.4 kg).There is no height or weight on file to calculate BMI.  General Appearance: NA  Eye Contact:  NA  Speech:  Slow and sometimes rambling  Volume:  Decreased  Mood:  Anxious and Dysphoric  Affect:  NA  Thought Process:  Descriptions of Associations: Intact  Orientation:  Full (Time, Place, and Person)  Thought Content:  Paranoid Ideation and Rumination  Suicidal Thoughts:  No  Homicidal Thoughts:  No  Memory:  Immediate;   Fair Recent;   Fair Remote;   Fair  Judgement:  Fair  Insight:  Shallow  Psychomotor Activity:  Tremor  Concentration:  Concentration: Fair and Attention Span: Fair  Recall:  [employer] of Knowledge:  Fair  Language:  Fair  Akathisia:  No  Handed:  Right  AIMS (if indicated):     Assets:  Communication Skills Housing Social Support  ADL's:  Impaired  Cognition:  Impaired,  Mild  Sleep:  fair but frequent awakening     Assessment/Plan: Major depressive disorder, single episode, moderate (HCC) - Plan: OLANZapine zydis (ZYPREXA) 5 MG disintegrating  tablet, venlafaxine XR (EFFEXOR-XR) 150 MG 24 hr capsule, hydrOXYzine (ATARAX) 10 MG tablet  Anxiety - Plan: venlafaxine XR (EFFEXOR-XR) 150 MG 24 hr capsule, hydrOXYzine (ATARAX) 10 MG tablet  Tremor - Plan: hydrOXYzine (ATARAX) 10 MG tablet  Reviewed current medication, collateral from her mother and chart reviewed.  Patient continues to have seizure that may or sometimes confusion with postictal phase.  Mother is concerned about tremors and shakes.  She is taking moderate dose of Lamictal and psychotropic medication.  I recommend not to give Seroquel if she is taking the olanzapine and most of the time behavior is better.  She has a home health aide 24/7.  Patient is a bedbound due to her physical condition.  I recommend we can try low-dose hydroxyzine to help  her anxiety, tremors and shakes but may need to consider neurology to help the tremors if hydroxyzine did not work.  Patient agreed with the plan.  Continue olanzapine 5 mg 2 times a day, Effexor 150 mg in the morning and she will try low-dose hydroxyzine.  Recommend to call us back if she has any question or any concern.  Follow-up in 3 months.   Follow Up Instructions:     I discussed the assessment and treatment plan with the patient. The patient was provided an opportunity to ask questions and all were answered. The patient agreed with the plan and demonstrated an understanding of the instructions.   The patient was advised to call back or seek an in-person evaluation if the symptoms worsen or if the condition fails to improve as anticipated.    Collaboration of Care: Other provider involved in patient's care AEB notes are available in epic to review.  Patient/Guardian was advised Release of Information must be obtained prior to any record release in order to collaborate their care with an outside provider. Patient/Guardian was advised if they have not already done so to contact the registration department to sign all necessary forms  in order for [place] to release information regarding their care.   Consent: Patient/Guardian gives verbal consent for treatment and assignment of benefits for services provided during this visit. Patient/Guardian expressed understanding and agreed to proceed.     I provided 30 minutes of non face to face time during this encounter.  Note: This document was prepared by [employer] voice dictation technology and any errors that results from this process are unintentional.    Cleotis Nipper, MD 07/07/2022

## 2022-07-10 ENCOUNTER — Other Ambulatory Visit (HOSPITAL_COMMUNITY): Payer: Self-pay | Admitting: Psychiatry

## 2022-07-10 DIAGNOSIS — F321 Major depressive disorder, single episode, moderate: Secondary | ICD-10-CM

## 2022-07-21 ENCOUNTER — Ambulatory Visit: Payer: 59 | Admitting: Neurology

## 2022-08-06 ENCOUNTER — Other Ambulatory Visit: Payer: Self-pay | Admitting: Neurology

## 2022-08-06 ENCOUNTER — Other Ambulatory Visit (HOSPITAL_COMMUNITY): Payer: Self-pay | Admitting: Psychiatry

## 2022-08-06 DIAGNOSIS — F321 Major depressive disorder, single episode, moderate: Secondary | ICD-10-CM

## 2022-08-06 DIAGNOSIS — F419 Anxiety disorder, unspecified: Secondary | ICD-10-CM

## 2022-08-10 ENCOUNTER — Other Ambulatory Visit (HOSPITAL_COMMUNITY): Payer: Self-pay | Admitting: Psychiatry

## 2022-08-10 DIAGNOSIS — F321 Major depressive disorder, single episode, moderate: Secondary | ICD-10-CM

## 2022-08-10 DIAGNOSIS — F419 Anxiety disorder, unspecified: Secondary | ICD-10-CM

## 2022-08-12 ENCOUNTER — Other Ambulatory Visit (HOSPITAL_COMMUNITY): Payer: Self-pay | Admitting: Psychiatry

## 2022-08-12 DIAGNOSIS — F321 Major depressive disorder, single episode, moderate: Secondary | ICD-10-CM

## 2022-08-12 DIAGNOSIS — F419 Anxiety disorder, unspecified: Secondary | ICD-10-CM

## 2022-08-16 ENCOUNTER — Ambulatory Visit: Payer: 59 | Admitting: Neurology

## 2022-09-07 ENCOUNTER — Other Ambulatory Visit (HOSPITAL_COMMUNITY): Payer: Self-pay | Admitting: Psychiatry

## 2022-09-07 DIAGNOSIS — F419 Anxiety disorder, unspecified: Secondary | ICD-10-CM

## 2022-09-07 DIAGNOSIS — F321 Major depressive disorder, single episode, moderate: Secondary | ICD-10-CM

## 2022-09-11 ENCOUNTER — Other Ambulatory Visit (HOSPITAL_COMMUNITY): Payer: Self-pay | Admitting: Psychiatry

## 2022-09-11 DIAGNOSIS — F321 Major depressive disorder, single episode, moderate: Secondary | ICD-10-CM

## 2022-09-17 ENCOUNTER — Other Ambulatory Visit (HOSPITAL_COMMUNITY): Payer: Self-pay | Admitting: Psychiatry

## 2022-09-17 DIAGNOSIS — F321 Major depressive disorder, single episode, moderate: Secondary | ICD-10-CM

## 2022-09-17 DIAGNOSIS — F419 Anxiety disorder, unspecified: Secondary | ICD-10-CM

## 2022-09-17 DIAGNOSIS — R251 Tremor, unspecified: Secondary | ICD-10-CM

## 2022-09-20 ENCOUNTER — Other Ambulatory Visit: Payer: Self-pay | Admitting: Neurology

## 2022-09-21 NOTE — Telephone Encounter (Signed)
Requested Prescriptions   Pending Prescriptions Disp Refills   clonazePAM (KLONOPIN) 1 MG tablet [Pharmacy Med Name: CLONAZEPAM 1 MG TABLET] 30 tablet 1    Sig: TAKE 1 TABLET BY MOUTH UP TO TWICE DAILY AS NEEDED FOR SEIZURES   Last seen 12/08/21, next 10/04/22  Dispenses   Dispensed Days Supply Quantity Provider Pharmacy  CLONAZEPAM 1 MG TABLET 07/22/2022 15 30 each Windell Norfolk, MD [facility] [phone] - [facility]...  CLONAZEPAM 1 MG TABLET 05/04/2022 15 30 each Windell Norfolk, MD [facility] [phone] - G...  CLONAZEPAM 1 MG TABLET 03/14/2022 15 30 each Windell Norfolk, MD [facility] [phone] - G...  CLONAZEPAM 1 MG TABLET 02/06/2022 15 30 each Windell Norfolk, MD [facility] [phone] - G...  CLONAZEPAM 1 MG TABLET 10/05/2021 15 30 each Windell Norfolk, MD [facility] [phone] - G.Marland KitchenMarland Kitchen

## 2022-10-04 ENCOUNTER — Encounter: Payer: Self-pay | Admitting: Neurology

## 2022-10-04 ENCOUNTER — Ambulatory Visit (INDEPENDENT_AMBULATORY_CARE_PROVIDER_SITE_OTHER): Payer: 59 | Admitting: Neurology

## 2022-10-04 VITALS — BP 132/74 | Ht 66.0 in

## 2022-10-04 DIAGNOSIS — G40019 Localization-related (focal) (partial) idiopathic epilepsy and epileptic syndromes with seizures of localized onset, intractable, without status epilepticus: Secondary | ICD-10-CM | POA: Diagnosis not present

## 2022-10-04 DIAGNOSIS — G40919 Epilepsy, unspecified, intractable, without status epilepticus: Secondary | ICD-10-CM | POA: Diagnosis not present

## 2022-10-04 DIAGNOSIS — Z5181 Encounter for therapeutic drug level monitoring: Secondary | ICD-10-CM | POA: Diagnosis not present

## 2022-10-04 DIAGNOSIS — Z9689 Presence of other specified functional implants: Secondary | ICD-10-CM

## 2022-10-04 MED ORDER — CENOBAMATE 14 X 12.5 MG & 14 X 25 MG PO TBPK: ORAL_TABLET | ORAL | 0 refills | Status: DC

## 2022-10-04 NOTE — Progress Notes (Signed)
PATIENT: Cassandra Andrade DOB: Feb 02, 1966  REASON FOR VISIT: follow up HISTORY FROM: patient PRIMARY NEUROLOGIST: Dr. Teresa Coombs  HISTORY OF PRESENT ILLNESS: Today 10/04/22: Patient presents today for follow-up, she is accompanied by mother and transport.  She was sleeping on the gurney, sleeping throughout the visit.  She was lost to follow-up, her last appointment was in October 2023.  Since then mother reported patient is compliant with her medications including Briviact, lamotrigine and lacosamide but she continued to have daily seizures.  She also have a VNS and Klonopin as needed.  Her last seizure was this morning.  Mother reports most of the time patient is aware of her seizures, will ask for the VNS magnet to swipe it.  Sometime after swiping the magnet she will go into having right facial twitching and right arm jerks.  They deny any tonic-clonic seizures. At last visit in October 2023, we have started her on Cenobamate but did not continue with the titration. In July 2023, I have started her on Clobazam but she did have psychiatric side effects on that medication, therefore it was discontinued.    INTERVAL HISTORY 12/08/2021:  Cassandra Andrade was called today via video visit. Her mother was present. Last video visit was in July.  At that time, we had switch her Keppra to Briviact.  Mother has reported irritability, anger, violence has resolved. She still continues to have seizures, on average once a week.  She is compliant with the medications, so far no additional side effects.  Reports some depression but she is manage with Dr. York Spaniel   INTERVAL HISTORY 7/13/ 2023 Cassandra Andrade was recently admitted to the hospital for supratherapeutic INR and GI bleed.  During this time there were no seizures noted.  She was having dysphagia requiring a PEG tube.  Back in April I made some change to her medication, I discontinued the levetiracetam and started her on Briviact.  Since then mother reported that the her  behavior have improved and also her seizures have improved.  Currently she is on with Briviact 100 mg twice daily, Vimpat 200 mg twice daily and lamotrigine 350 mg twice daily.  She does also have a VNS.  She reported 1 seizure last week that she described described as mild seizure.   She reports she was aware and was able to tell mother that she was having a seizure.  She continues on the current medications, denies any side effects.  Mother reports that she is back to her normal self, she can do everything except she is bedridden at the moment.   INTERVAL HISTORY 12/15/2020 Patient presents today, accompanied by son for seizure follow up. At last visit, in October, plan was to start Clobazam at a lower dose since seizures were not controlled. Since then she has been admitted twice for seizures and AMS.  Her antiseizure medication has been uptitrated, her lamotrigine increased to 350 twice daily and her Onfi increased to 10 mg at night.  Per son the last seizure was on January 1 Currently she is at rehab, doing well, denies any additional seizures.  Son reported patient get easily upset, cries a lot, she does have a psychiatrist and a therapist but has not follow-up with them.  Her last Keppra level was 101, her last lamotrigine level was 17.9.   INTERVAL HISTORY 12/15/2020: Patient presents today for follow-up with mother.  Her seizure frequency is currently twice a week.  Mother is using Nayzilam for the seizures, she think that the VNS  is not working appropriately as patient is wiping but seizure continues.  She is currently on Lamictal 300 mg twice a day, Vimpat 200 mg twice a day and Keppra 1500 mg in the morning and 2000 mg in the evening.  Mother try 5 mg of Onfi in the past and believe that patient had reacted badly on it (induce seizures) and has not given other medical again.     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  The patient continues to have 1-2 seizure events a week.  She does have a vagal nerve stimulator and tries to use her magnet if she feels an event about to occur.  She Andrade that she typically feels like she is "losing control" before seizure starts.  She typically will try to use her magnet and that will decrease the severity of a seizure.  She remains on Lamictal 300 mg twice a day, Vimpat 200 mg twice a day Keppra 1500 mg in the morning and 2000 mg in evening.  She was prescribed Onfi 5 mg daily however she has not started this.  Patient's mother Andrade that they were concerned about potential side effects.    HISTORY (copied from Dr. Clarisa Kindred note) Cassandra Andrade is a 57 year old right-handed white female with a history of a stroke related to a prior aneurysm rupture. The patient has a right above-knee amputation, she has a prosthesis for this but she believes that her ability to walk has declined somewhat over the last several months, she has not had any further falls.  She tries to do exercises.  The patient is still having relatively frequent seizures, but most of her seizures she can abort with the vagal nerve stimulator. She returns for further evaluation.  She remains on Lamictal, Vimpat, and Keppra.  She tolerates medications well.  REVIEW OF SYSTEMS: Out of a complete 14 system review of symptoms, the patient complains only of the following symptoms, and all other reviewed systems are negative.  ALLERGIES: Allergies  Allergen Reactions   Vicodin [Hydrocodone-Acetaminophen] Nausea And Vomiting   Morphine Rash   Morphine And Codeine Rash    Allergic reaction only in iv form    HOME MEDICATIONS: Outpatient Medications Prior to Visit  Medication Sig Dispense Refill   acetaminophen (TYLENOL) 650 MG CR tablet Take 650 mg by mouth every 8 (eight) hours as needed for pain.     Ascorbic Acid (VITAMIN C) 1000 MG tablet Take 1,000 mg by mouth daily.      BRIVIACT 100 MG TABS tablet TAKE 1 TABLET BY MOUTH TWICE A DAY 60 tablet 5   clonazePAM (KLONOPIN) 1 MG tablet TAKE 1 TABLET BY MOUTH UP TO TWICE DAILY AS NEEDED FOR SEIZURES 30 tablet 1   hydrOXYzine (ATARAX) 10 MG tablet Take 1 tablet (10 mg total) by mouth 2 (two) times daily. 60 tablet 2   ketoconazole (NIZORAL) 2 % shampoo Apply 1 application topically 2 (two) times a week. Monday,wednesday     lacosamide (VIMPAT) 200 MG TABS tablet TAKE 1 TABLET BY MOUTH TWICE A DAY 180 tablet 3   lamoTRIgine (LAMICTAL) 100 MG tablet TAKE 1 TABLET BY MOUTH TWICE A DAY 180 tablet 4   lamoTRIgine (LAMICTAL) 200 MG tablet TAKE 1 TABLET BY MOUTH TWICE A DAY 180 tablet 3   lamoTRIgine (LAMICTAL) 25 MG tablet TAKE 2 TABLETS BY MOUTH 2 TIMES DAILY. 120 tablet 5  levothyroxine (SYNTHROID) 75 MCG tablet Place 1 tablet (75 mcg total) into feeding tube daily at 6 (six) AM. 30 tablet 0   Nutritional Supplements (FEEDING SUPPLEMENT, OSMOLITE 1.5 CAL,) LIQD Place 237 mLs into feeding tube 6 (six) times daily. 20000 mL 0   OLANZapine zydis (ZYPREXA) 5 MG disintegrating tablet Take 1 tablet (5 mg total) by mouth 2 (two) times daily as needed. 60 tablet 2   omeprazole (PRILOSEC) 20 MG capsule Take 20 mg by mouth at bedtime.     promethazine (PHENERGAN) 25 MG tablet Take 25 mg by mouth every 8 (eight) hours as needed for nausea or vomiting.     rivaroxaban (XARELTO) 20 MG TABS tablet Take 20 mg by mouth daily with supper.     venlafaxine XR (EFFEXOR-XR) 150 MG 24 hr capsule Take 1 capsule (150 mg total) by mouth daily with breakfast. 90 capsule 0   Water For Irrigation, Sterile (FREE WATER) SOLN Place 120 mLs into feeding tube 6 (six) times daily. 10000 mL 0   enoxaparin (LOVENOX) 60 MG/0.6ML injection Inject 0.6 mLs (60 mg total) into the skin every 12 (twelve) hours for 5 days. 6 mL 0   metoprolol tartrate (LOPRESSOR) 25 MG tablet Take 0.5 tablets (12.5 mg total) by mouth 2 (two) times daily. 30 tablet 0   QUEtiapine  (SEROQUEL) 25 MG tablet TAKE 1 TABLET BY MOUTH TWICE A DAY (Patient not taking: Reported on 10/04/2022) 60 tablet 6   traMADol (ULTRAM) 50 MG tablet Take 1 tablet (50 mg total) by mouth every 12 (twelve) hours as needed for moderate pain or severe pain. (Patient not taking: Reported on 10/04/2022) 10 tablet 0   warfarin (COUMADIN) 10 MG tablet Take 1 tablet (10 mg total) by mouth daily at 4 PM. (Patient not taking: Reported on 10/04/2022) 15 tablet 0   No facility-administered medications prior to visit.    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 femur, folllowed by MRSA   IR GASTROSTOMY TUBE MOD SED  08/19/2021   TOTAL KNEE ARTHROPLASTY Right     FAMILY HISTORY: Family History  Problem Relation Age of Onset   Anxiety disorder Mother    Diabetes Father    Seizures Neg Hx     SOCIAL HISTORY: Social History   Socioeconomic History   Marital status: Divorced    Spouse name: Not on file   Number of children: 3   Years of education: GED   Highest education level: Not on file  Occupational History   Not on file  Tobacco Use   Smoking status: Never   Smokeless tobacco: Never   Tobacco comments:    never used tobacco  Vaping Use   Vaping status: Never Used  Substance and Sexual Activity  Alcohol use: No    Alcohol/week: 0.0 standard drinks of  alcohol   Drug use: No   Sexual activity: Not Currently  Other Topics Concern   Not on file  Social History Narrative   Patient lives at home with mom.    Patient has 3 children.    Caffeine 2-3 cups daily   Right Handed         Social Determinants of Health   Financial Resource Strain: Patient Declined (09/10/2021)   [facility] from [facility] to [facility]24[facility] visits prior to 04/24/2022., [facility]   Overall Financial Resource Strain (CARDIA)    Difficulty of Paying Living Expenses: Patient declined  Food Insecurity: Low Risk  (08/19/2022)   Received from [facility]   Food vital sign    Within the past 12 months, you worried that your food would run out before you got money to buy more: Never true    Within the past 12 months, the food you bought just didn't last and you didn't have money to get more. : Never true  Transportation Needs: Not on file (08/19/2022)  Physical Activity: Inactive (09/10/2021)   [facility], [facility] visits prior to 04/24/2022., [facility] visits prior to 04/24/2022., [facility]   Exercise Vital Sign    Days of Exercise per Week: 0 days    Minutes of Exercise per Session: 10 min  Stress: Not on file  Social Connections: Not on file  Intimate Partner Violence: Not on file     PHYSICAL EXAM  Vitals:   10/04/22 0956  BP: 132/74  Height: 5\' 6"  (1.676 m)    Body mass index is 28.25 kg/m.  Generalized: Sleeping throughout the evaluation, neuro examination deferred   DIAGNOSTIC DATA (LABS, IMAGING, TESTING) - I reviewed patient records, labs, notes, testing and imaging myself where available.  Lab Results  Component Value Date   WBC 8.7 08/25/2021   HGB 8.5 (L) 08/25/2021   HCT 26.8 (L) 08/25/2021   MCV 87.3 08/25/2021   PLT 466 (H) 08/25/2021      Component Value Date/Time   NA 134 (L)  08/25/2021 0252   NA 143 11/12/2020 1210   NA 141 01/12/2017 1125   K 4.6 08/25/2021 0252   K 4.5 01/12/2017 1125   CL 94 (L) 08/25/2021 0252   CL 97 (L) 07/11/2013 1346   CO2 28 08/25/2021 0252   CO2 30 (H) 01/12/2017 1125   GLUCOSE 163 (H) 08/25/2021 0252   GLUCOSE 79 01/12/2017 1125   GLUCOSE 126 (H) 07/11/2013 1346   BUN 16 08/25/2021 0252   BUN 20 11/12/2020 1210   BUN 11.2 01/12/2017 1125   CREATININE 0.57 08/25/2021 0252   CREATININE 0.98 01/11/2018 1116   CREATININE 0.9 01/12/2017 1125   CALCIUM 8.9 08/25/2021 0252   CALCIUM 10.1 01/12/2017 1125   PROT 5.7 (L) 08/23/2021 0441   PROT 7.0 11/12/2020 1210   PROT 7.8 01/12/2017 1125   ALBUMIN 2.0 (L) 08/23/2021 0441   ALBUMIN 4.5 11/12/2020 1210   ALBUMIN 4.0 01/12/2017 1125   AST 29 08/23/2021 0441   AST 23 01/11/2018 1116   AST 34 01/12/2017 1125   ALT 15 08/23/2021 0441   ALT 22 01/11/2018 1116   ALT 65 (H) 01/12/2017 1125   ALKPHOS 87 08/23/2021 0441   ALKPHOS 105 01/12/2017 1125   BILITOT 0.4 08/23/2021 0441  BILITOT <0.2 11/12/2020 1210   BILITOT 0.2 (L) 01/11/2018 1116   BILITOT 0.23 01/12/2017 1125   GFRNONAA >60 08/25/2021 0252   GFRNONAA >60 01/11/2018 1116   GFRAA 69 03/24/2020 1324   GFRAA >60 01/11/2018 1116   No results found for: "CHOL", "HDL", "LDLCALC", "LDLDIRECT", "TRIG", "CHOLHDL" Lab Results  Component Value Date   HGBA1C 5.4 08/21/2021   Lab Results  Component Value Date   VITAMINB12 1,252 (H) 08/21/2021   Lab Results  Component Value Date   TSH 7.586 (H) 08/21/2021    VNS Interrogated today:  Output current   2 mA Frequency   30 Hz  Pulse Width    250 usec On time    21 sec Off time    1.1 min  Duty Cycle    29 %     Magnet Mode:  Output current   2.25 mA Pulse Width   250 usec  On time    60 sec   EEG 02/16/2021 This study is consistent with patient's known history of epilepsy arising from right frontal region. There is also cortical dysfunction in right frontal  consistent with prior craniotomy. No seizures were seen throughout the recording.   MRI 02/18/2021 1. No acute intracranial abnormality or significant interval change. 2. Chronic right MCA encephalomalacia. 3. Study was incomplete due to severe patient motion.  ASSESSMENT AND PLAN 57 y.o. year old female  has a past medical history of Anxiety, Arthritis, Cancer (HCC), CVA (cerebrovascular accident due to intracerebral hemorrhage) (HCC) (2004), Depression, DVT (deep venous thrombosis) (HCC), MRSA (methicillin resistant Staphylococcus aureus), S/P BKA (below knee amputation) unilateral (HCC), Seizures (HCC), Thyroid disease, and Unspecified epilepsy with intractable epilepsy (02/28/2013). here with :  Intractable seizures  Continue Lamictal 350 mg twice a day Continue Vimpat 200 mg twice a day Continue Briviact 100 mg twice a day Will restart Cenobamate 12.5 mg daily for 2 weeks, will titrate with goal of 200 mg daily Will check labs today. VNS interrogated today, setting changed, patient tolerated procedure well.  Prescribed Klonopin to use as needed if she feels the seizures are coming. Can take an extra dose if needed.  Already tried Levetiracetam, Lamotrigine, Lacosamide, Briviact, Clobazam, Cenobamate. ASM to consider in the future: Parempanel, Epidiolex, Topiramate/Zonisamide Follow up in 6 weeks or sooner if worse.      Luther Parody, MD 10/04/2022, 10:28 AM [facility] [address] [place], [place] [address] [phone]

## 2022-10-06 ENCOUNTER — Telehealth (HOSPITAL_COMMUNITY): Payer: 59 | Admitting: Psychiatry

## 2022-10-20 MED ORDER — HYDROXYZINE HCL 10 MG PO TABS
10.0000 mg | ORAL_TABLET | Freq: Two times a day (BID) | ORAL | 0 refills | Status: AC
Start: 2022-10-20 — End: ?

## 2022-10-20 MED ORDER — OLANZAPINE 5 MG PO TBDP
5.0000 mg | ORAL_TABLET | Freq: Two times a day (BID) | ORAL | 0 refills | Status: DC
Start: 2022-10-20 — End: 2022-12-01

## 2022-10-20 NOTE — Addendum Note (Signed)
Addended by: Kathryne Sharper T on: 10/20/2022 11:54 AM   Modules accepted: Orders

## 2022-10-23 ENCOUNTER — Other Ambulatory Visit: Payer: Self-pay | Admitting: Neurology

## 2022-10-23 ENCOUNTER — Other Ambulatory Visit (HOSPITAL_COMMUNITY): Payer: Self-pay | Admitting: Psychiatry

## 2022-10-23 DIAGNOSIS — F419 Anxiety disorder, unspecified: Secondary | ICD-10-CM

## 2022-10-23 DIAGNOSIS — F321 Major depressive disorder, single episode, moderate: Secondary | ICD-10-CM

## 2022-10-26 ENCOUNTER — Other Ambulatory Visit: Payer: Self-pay | Admitting: Neurology

## 2022-10-26 MED ORDER — CENOBAMATE 14 X 50 MG & 14 X100 MG PO TBPK: ORAL_TABLET | ORAL | 0 refills | Status: AC

## 2022-10-26 NOTE — Telephone Encounter (Signed)
Requested Prescriptions   Pending Prescriptions Disp Refills   BRIVIACT 100 MG TABS tablet [Pharmacy Med Name: BRIVIACT 100 MG TABLET] 60 tablet     Sig: TAKE 1 TABLET BY MOUTH TWICE A DAY   LAST SEEN 10/04/22, NEXT APPT 11/15/22 Dispenses  ROUTING TO PROVIDER TO FILL Dispensed Days Supply Quantity Provider Pharmacy  BRIVIACT 100 MG TABLET 09/18/2022 30 60 each Windell Norfolk, MD [facility] [phone] - G...  BRIVIACT 100 MG TABLET 08/22/2022 30 60 each Windell Norfolk, MD [facility] [phone] - G...  BRIVIACT 100 MG TABLET 07/13/2022 30 60 each Windell Norfolk, MD [facility] [phone] - G...  BRIVIACT 100 MG TABLET 06/07/2022 30 60 each Windell Norfolk, MD [facility] [phone] - G...  BRIVIACT 100 MG TABLET 05/05/2022 30 60 each Windell Norfolk, MD [facility] [phone] - G...  BRIVIACT 100 MG TABLET 04/06/2022 30 60 each Windell Norfolk, MD [facility] [phone] - G...  BRIVIACT 100 MG TABLET 03/05/2022 30 60 each Windell Norfolk, MD [facility] [phone] - G...  BRIVIACT 100 MG TABLET 02/06/2022 30 60 each Windell Norfolk, MD [facility] [phone] - G...  BRIVIACT 100 MG TABLET 01/08/2022 30 60 each Windell Norfolk, MD [facility] [phone] - G...  BRIVIACT 100 MG TABLET 12/06/2021 30 60 each Windell Norfolk, MD [facility] [phone] - G...  BRIVIACT 100 MG TABLET 11/06/2021 30 60 each Windell Norfolk, MD [facility] [phone] - G.Marland KitchenMarland Kitchen

## 2022-10-30 ENCOUNTER — Other Ambulatory Visit (HOSPITAL_COMMUNITY): Payer: Self-pay | Admitting: Psychiatry

## 2022-10-30 ENCOUNTER — Other Ambulatory Visit: Payer: Self-pay | Admitting: Neurology

## 2022-10-30 DIAGNOSIS — F419 Anxiety disorder, unspecified: Secondary | ICD-10-CM

## 2022-10-30 DIAGNOSIS — F321 Major depressive disorder, single episode, moderate: Secondary | ICD-10-CM

## 2022-10-30 DIAGNOSIS — R251 Tremor, unspecified: Secondary | ICD-10-CM

## 2022-11-08 ENCOUNTER — Telehealth: Payer: Self-pay | Admitting: Neurology

## 2022-11-08 NOTE — Telephone Encounter (Signed)
Call to patients mother, she states since restarting the Xcorpi, patient has been talking in her sleep, face is always frowned, keeping her hands in her mouth, and that she is having to constantly watch her due to trying to get our of bed. She is currently at the 50mg   daily dose of Xcorpi. Mother is scared to just stop since it's for seizures but unsure what to do about side effects advised will send to work in provider for advice/ recommendations. Mother appreciative.

## 2022-11-08 NOTE — Telephone Encounter (Signed)
Spoke with mother, advised her to hold Xcopri and continue to monitor patient. If she does not get better, take her to her PCP and urgent care to rule out infection. As of now, mother said possible low grade fever but no cough, no foul smelling urine.

## 2022-11-08 NOTE — Telephone Encounter (Signed)
Pt mother states since pt has been on this medication, pt talks to her self, and is always confused.  Pt 's mother reports that she watches her out of concern, pt's mother is asking for a call to discuss her concerns.

## 2022-11-15 ENCOUNTER — Telehealth: Payer: TRICARE For Life (TFL) | Admitting: Neurology

## 2022-11-17 ENCOUNTER — Telehealth (HOSPITAL_COMMUNITY): Payer: 59 | Admitting: Psychiatry

## 2022-11-18 ENCOUNTER — Other Ambulatory Visit (HOSPITAL_COMMUNITY): Payer: Self-pay | Admitting: Psychiatry

## 2022-11-18 DIAGNOSIS — F321 Major depressive disorder, single episode, moderate: Secondary | ICD-10-CM

## 2023-01-10 ENCOUNTER — Encounter (HOSPITAL_COMMUNITY): Payer: TRICARE For Life (TFL) | Admitting: Psychiatry

## 2023-01-10 ENCOUNTER — Encounter (HOSPITAL_COMMUNITY): Payer: Self-pay

## 2023-01-10 NOTE — Progress Notes (Signed)
I called patient's phone number on her appointment.  Her mother picked up the phone and she reported the patient died in November 21, 2022.  Condolences provided.

## 2023-06-27 IMAGING — CT CT HEAD CODE STROKE
3 series · 16 of 47 positions shown, 19 images · non-contrast
Comparison: 07/14/2021

CLINICAL DATA: Code stroke. Seizure activity, pupils unequal,
history of aneurysm coil



[Series 2: head 5.0 h30s · axial · 0.48mm/px · z∈[-155,-15]mm · 10 of 34 slices shown, 13 images]
[im 3/34  brain]
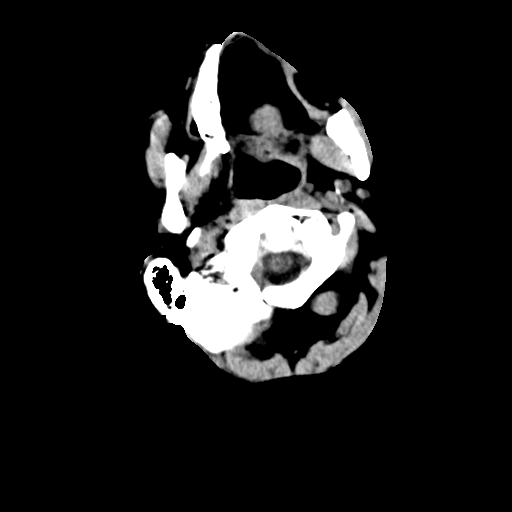
[im 3/34  bone]
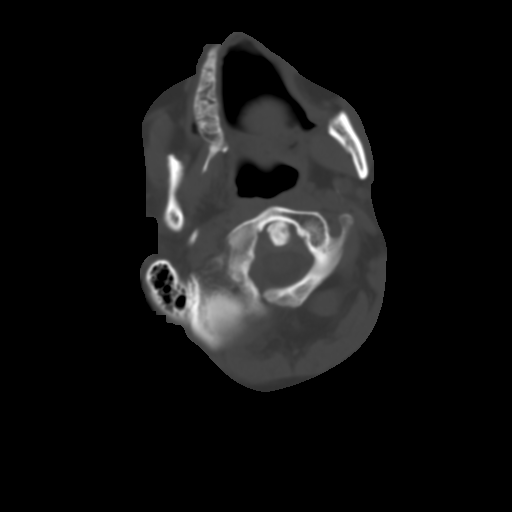
[im 6/34  brain]
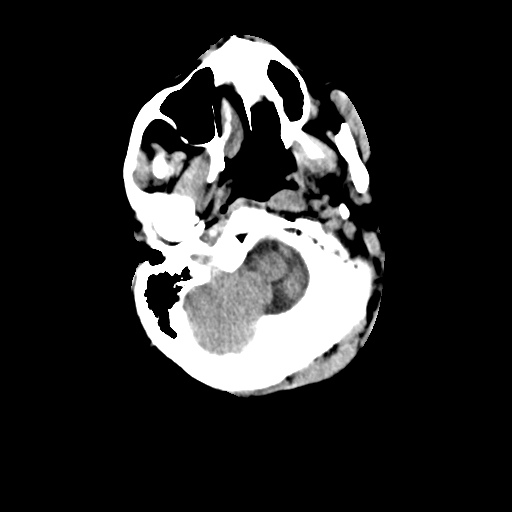
[im 10/34  brain]
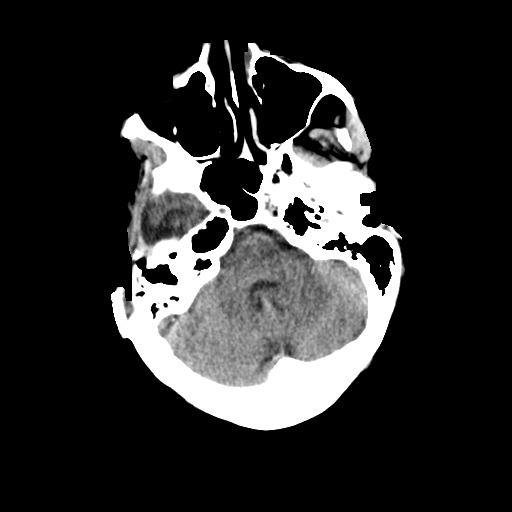
[im 12/34  brain]
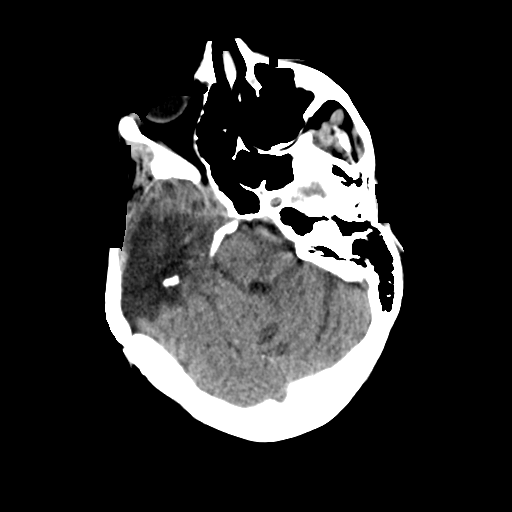
[im 15/34  brain]
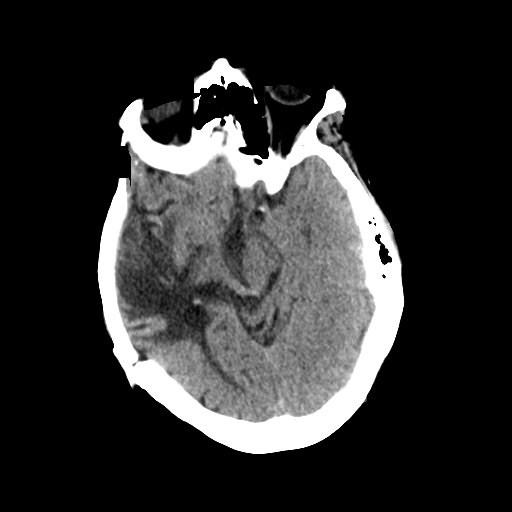
[im 15/34  bone]
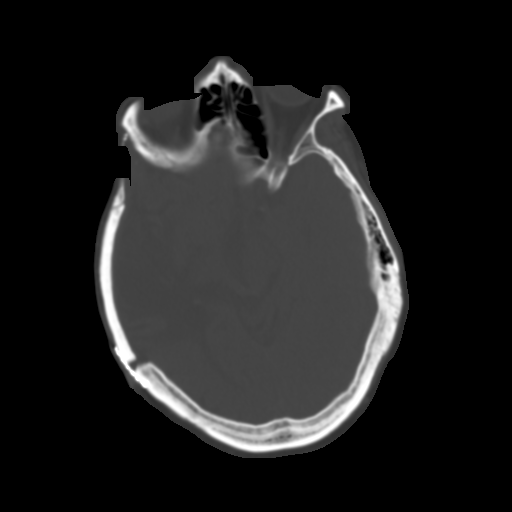
[im 19/34  brain]
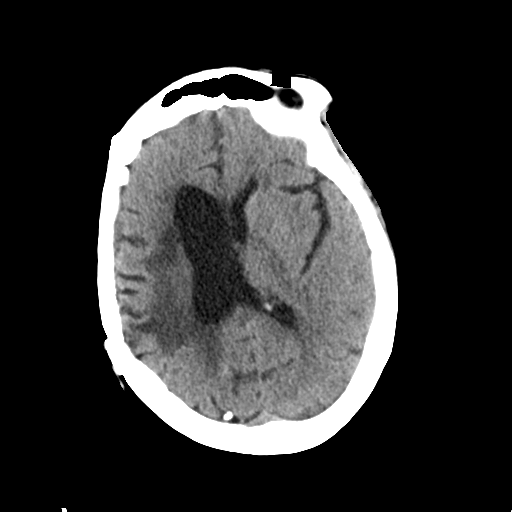
[im 22/34  brain]
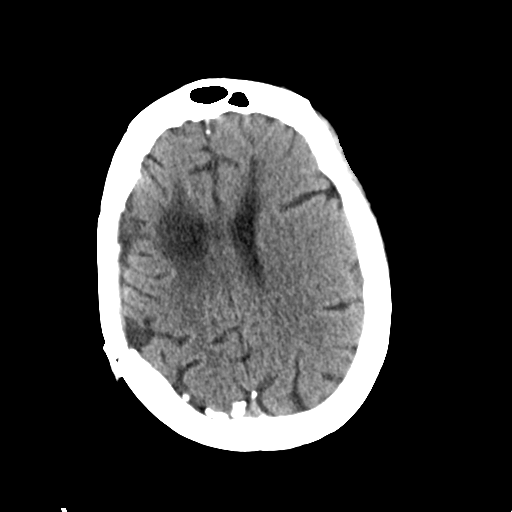
[im 26/34  brain]
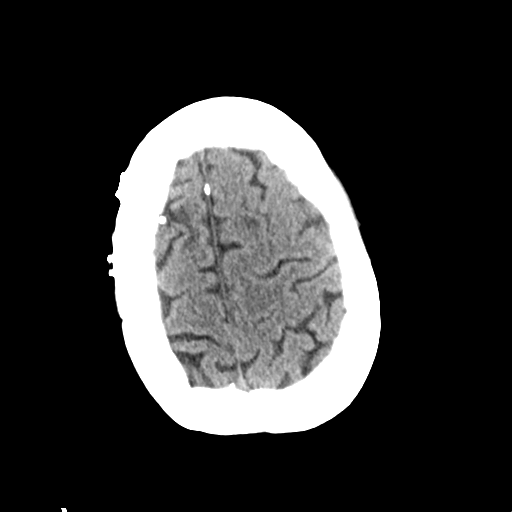
[im 28/34  brain]
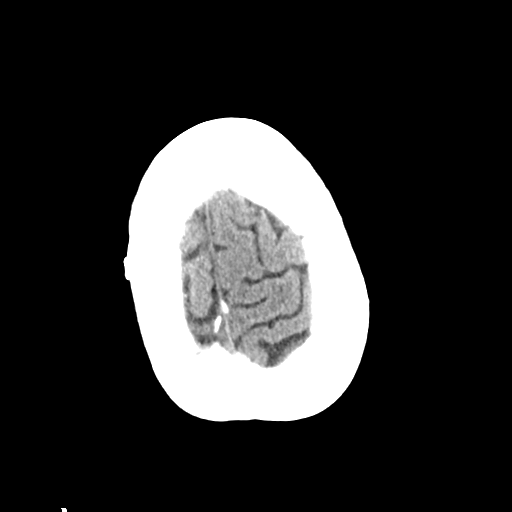
[im 28/34  bone]
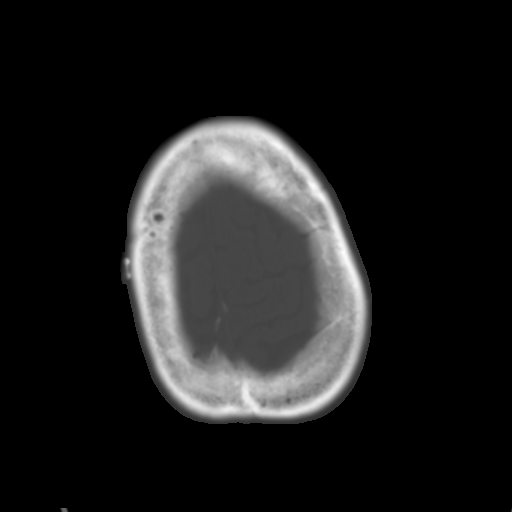
[im 31/34  brain]
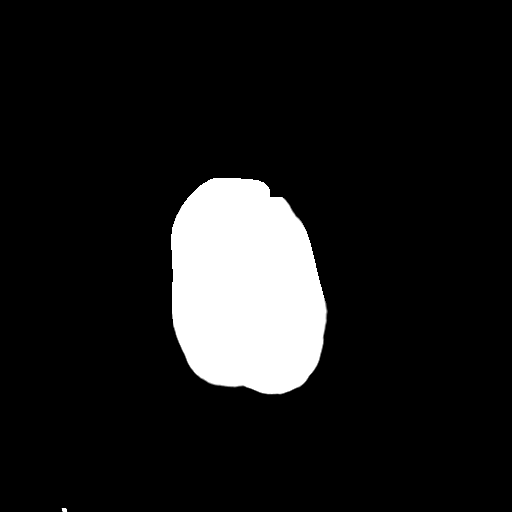

[Series 4: head 3.0 mpr cor · coronal · 0.34mm/px · 3 of 69 slices shown]
[im 23/69  brain]
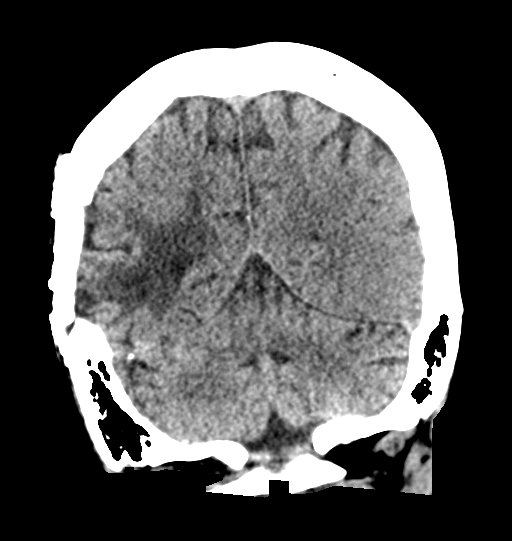
[im 31/69  brain]
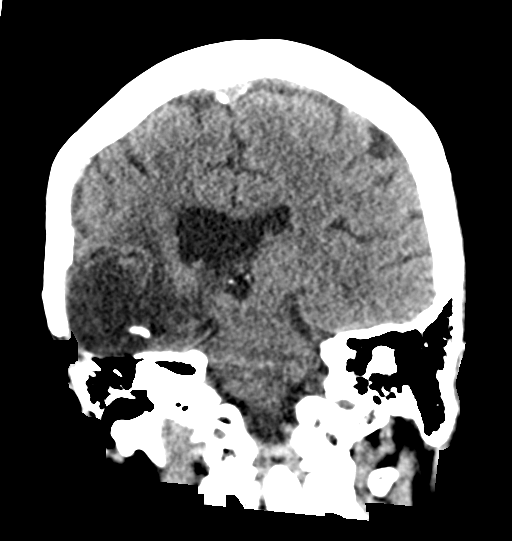
[im 38/69  brain]
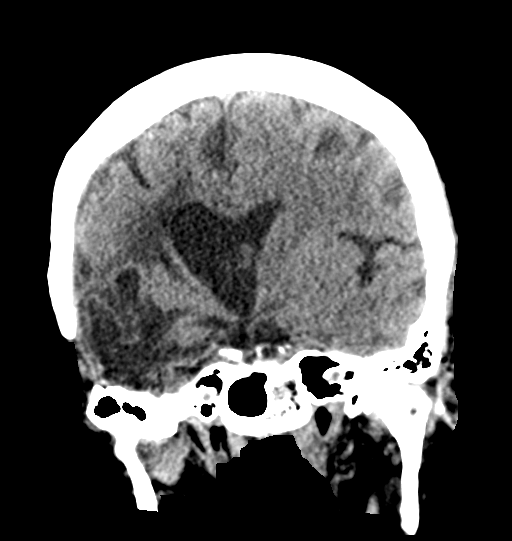

[Series 5: head 3.0 mpr sag · sagittal · 0.36mm/px · 3 of 55 slices shown]
[im 19/55  brain]
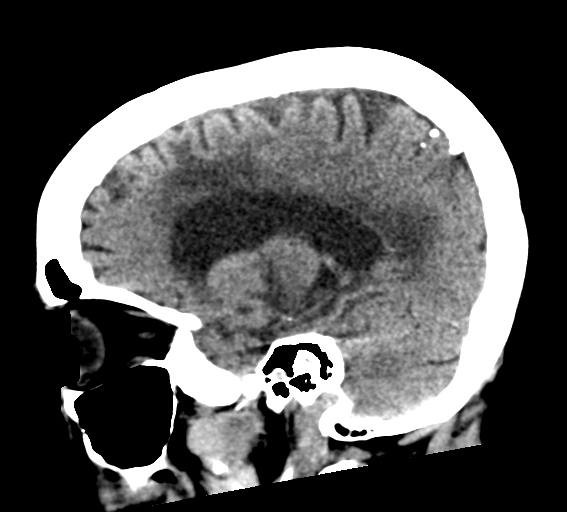
[im 28/55  brain]
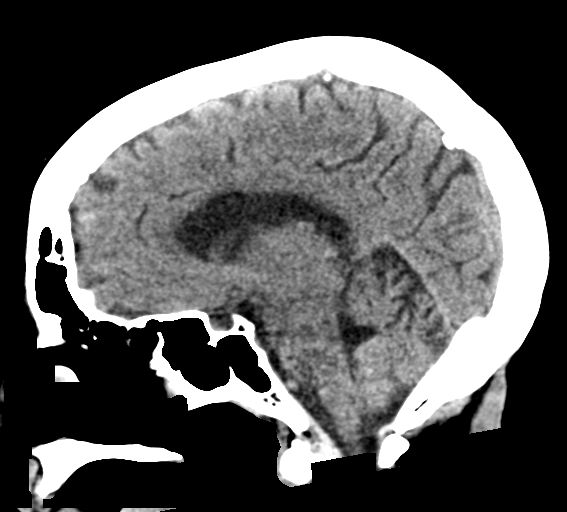
[im 37/55  brain]
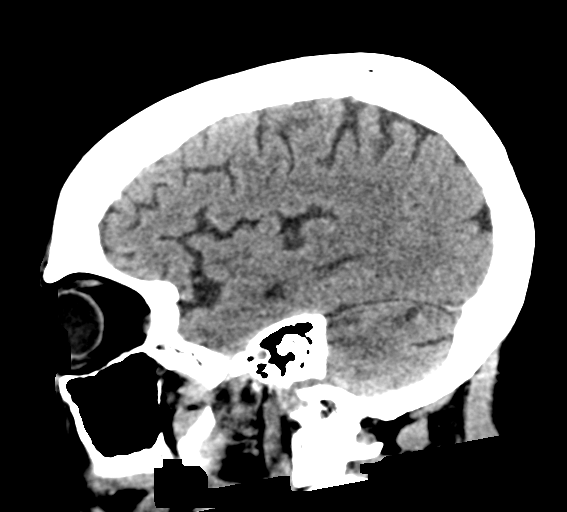

[16 of 47 positions shown; findings below may reference images not displayed]

FINDINGS: Brain: Redemonstrated right temporal and frontoparietal
encephalomalacia with ex vacuo dilatation of the right lateral
ventricle. No evidence of additional hypodensity, acute hemorrhage,
mass, mass effect, or midline shift.

Vascular: No hyperdense vessel. A clip is noted in the right middle
cranial fossa.

Skull: Prior right pterional craniotomy.  No acute fracture.

Sinuses/Orbits: No acute finding.

Other: The mastoid air cells are well aerated.

ASPECTS (Alberta Stroke Program Early CT Score)

- Ganglionic level infarction (caudate, lentiform nuclei, internal
capsule, insula, M1-M3 cortex): 7

- Supraganglionic infarction (M4-M6 cortex): 3

Total score (0-10 with 10 being normal): 10
IMPRESSION: 1. No acute intracranial process. Overall unchanged appearance of
the brain.
2. ASPECTS is 10

Code stroke imaging results were communicated on 07/19/2021 at [DATE] to provider TANIA ENID via secure text paging.
